# Patient Record
Sex: Female | Born: 1960 | Race: Black or African American | Hispanic: No | Marital: Married | Smoking: Former smoker
Health system: Southern US, Community
[De-identification: ages and names within clinical notes are randomized; demographics above are authoritative.]

## PROBLEM LIST (undated history)

## (undated) DIAGNOSIS — C189 Malignant neoplasm of colon, unspecified: Secondary | ICD-10-CM

## (undated) DIAGNOSIS — Z780 Asymptomatic menopausal state: Secondary | ICD-10-CM

## (undated) DIAGNOSIS — B019 Varicella without complication: Secondary | ICD-10-CM

## (undated) DIAGNOSIS — G620 Drug-induced polyneuropathy: Secondary | ICD-10-CM

## (undated) DIAGNOSIS — T451X5A Adverse effect of antineoplastic and immunosuppressive drugs, initial encounter: Secondary | ICD-10-CM

## (undated) DIAGNOSIS — E876 Hypokalemia: Secondary | ICD-10-CM

## (undated) DIAGNOSIS — I10 Essential (primary) hypertension: Secondary | ICD-10-CM

## (undated) HISTORY — PX: ECTOPIC PREGNANCY SURGERY: SHX613

## (undated) HISTORY — DX: Adverse effect of antineoplastic and immunosuppressive drugs, initial encounter: T45.1X5A

## (undated) HISTORY — DX: Varicella without complication: B01.9

## (undated) HISTORY — PX: SHOULDER SURGERY: SHX246

## (undated) HISTORY — DX: Malignant neoplasm of colon, unspecified: C18.9

## (undated) HISTORY — DX: Asymptomatic menopausal state: Z78.0

## (undated) HISTORY — PX: COLONOSCOPY: SHX174

## (undated) HISTORY — DX: Adverse effect of antineoplastic and immunosuppressive drugs, initial encounter: G62.0

## (undated) HISTORY — DX: Hypokalemia: E87.6

## (undated) HISTORY — PX: BREAST BIOPSY: SHX20

## (undated) SURGERY — INSERTION, TUNNELED CENTRAL VENOUS DEVICE, WITH PORT
Anesthesia: General

---

## 2004-03-08 ENCOUNTER — Ambulatory Visit: Payer: Self-pay | Admitting: General Practice

## 2004-03-10 ENCOUNTER — Ambulatory Visit: Payer: Self-pay | Admitting: General Practice

## 2004-08-26 ENCOUNTER — Ambulatory Visit: Payer: Self-pay | Admitting: General Surgery

## 2004-10-04 ENCOUNTER — Emergency Department (HOSPITAL_COMMUNITY): Admission: EM | Admit: 2004-10-04 | Discharge: 2004-10-04 | Payer: Self-pay | Admitting: Emergency Medicine

## 2004-10-18 ENCOUNTER — Ambulatory Visit (HOSPITAL_COMMUNITY): Admission: RE | Admit: 2004-10-18 | Discharge: 2004-10-18 | Payer: Self-pay | Admitting: Orthopedic Surgery

## 2005-03-13 ENCOUNTER — Ambulatory Visit: Payer: Self-pay | Admitting: General Practice

## 2006-04-10 ENCOUNTER — Ambulatory Visit: Payer: Self-pay | Admitting: General Surgery

## 2006-08-02 ENCOUNTER — Emergency Department: Payer: Self-pay | Admitting: Emergency Medicine

## 2015-05-21 ENCOUNTER — Encounter: Payer: Self-pay | Admitting: Physician Assistant

## 2015-05-21 ENCOUNTER — Ambulatory Visit: Payer: Self-pay | Admitting: Physician Assistant

## 2015-05-21 VITALS — BP 130/70 | HR 80 | Temp 98.3°F

## 2015-05-21 DIAGNOSIS — Z862 Personal history of diseases of the blood and blood-forming organs and certain disorders involving the immune mechanism: Secondary | ICD-10-CM

## 2015-05-21 DIAGNOSIS — F439 Reaction to severe stress, unspecified: Secondary | ICD-10-CM

## 2015-05-21 DIAGNOSIS — R1013 Epigastric pain: Secondary | ICD-10-CM

## 2015-05-21 DIAGNOSIS — R634 Abnormal weight loss: Secondary | ICD-10-CM

## 2015-05-21 MED ORDER — ESOMEPRAZOLE MAGNESIUM 40 MG PO CPDR: 40.0000 mg | DELAYED_RELEASE_CAPSULE | Freq: Every day | ORAL | Status: DC

## 2015-05-21 NOTE — Progress Notes (Signed)
54 y/o with epigastric pain x one week , gas, anorexia ,  Wt loss over the year  . She works > 40 hours a week and is worried about a son 80 living at home. Denies n,v,d,c, or melana  Denies cp or sob . She drinks one cup of coffee, one soda per day.  Never had a mammogram or colonoscopy. Fam Hx father with prostate ca.  O/ anxious thin, pleasant  Heart rsr lungs clear  Abd soft  Epigastric tenderness without guarding no mass or megally   A epigastric pain ? Gastritis  , Stress  Wt loss  Hx of anemia ( was refused donating blood recently and no MP s in 2 yrs.  P / nexium 40 started and appt is made for her to establish care with PCP who can follow up on sxs  Need referral for colonoscopy and likely endoscopy in view of sxs. Also labs.  Can refer her to [facility] . She is to follow up here in 2 weeks if her appt is later that that.

## 2015-05-25 ENCOUNTER — Other Ambulatory Visit: Payer: Self-pay | Admitting: Family

## 2015-05-25 DIAGNOSIS — R1013 Epigastric pain: Secondary | ICD-10-CM

## 2015-05-25 MED ORDER — ESOMEPRAZOLE MAGNESIUM 40 MG PO CPDR: 40.0000 mg | DELAYED_RELEASE_CAPSULE | Freq: Every day | ORAL | Status: DC

## 2015-06-03 ENCOUNTER — Ambulatory Visit: Payer: Self-pay | Admitting: Physician Assistant

## 2015-06-03 ENCOUNTER — Encounter: Payer: Self-pay | Admitting: Physician Assistant

## 2015-06-03 VITALS — BP 130/70 | HR 97 | Temp 98.1°F

## 2015-06-03 DIAGNOSIS — N390 Urinary tract infection, site not specified: Secondary | ICD-10-CM

## 2015-06-03 DIAGNOSIS — B3749 Other urogenital candidiasis: Secondary | ICD-10-CM

## 2015-06-03 LAB — POCT URINALYSIS DIPSTICK
Bilirubin, UA: NEGATIVE
Blood, UA: NEGATIVE
Glucose, UA: NEGATIVE
KETONES UA: NEGATIVE
Nitrite, UA: NEGATIVE
PH UA: 6
PROTEIN UA: NEGATIVE
SPEC GRAV UA: 1.02
Urobilinogen, UA: 0.2

## 2015-06-03 MED ORDER — CIPROFLOXACIN HCL 250 MG PO TABS: 250.0000 mg | ORAL_TABLET | Freq: Two times a day (BID) | ORAL | Status: DC

## 2015-06-03 NOTE — Progress Notes (Signed)
S:  C/o uti sx for 2 weeks, burning, urgency, frequency, denies vaginal discharge, abdominal pain or flank pain, fever/chills;  Remainder ros neg, started drinking water and cranberry juice when sx first appeared  O:  Vitals wnl, nad, no cva tenderness, back nontender, lungs c t a,cv rrr, abd soft nontender, bs normal, n/v intact  A: uti  P: cipro 250mg  bid x 7d, increase water intake, add cranberry juice, return if not improving in 2 -3 days, return earlier if worsening, discussed pyelonephritis sx

## 2015-06-08 ENCOUNTER — Encounter: Payer: Self-pay | Admitting: Family Medicine

## 2015-06-08 ENCOUNTER — Ambulatory Visit (INDEPENDENT_AMBULATORY_CARE_PROVIDER_SITE_OTHER): Payer: 59 | Admitting: Family Medicine

## 2015-06-08 ENCOUNTER — Other Ambulatory Visit (HOSPITAL_COMMUNITY)
Admission: RE | Admit: 2015-06-08 | Discharge: 2015-06-08 | Disposition: A | Discharge status: Home / Self Care | Payer: 59 | Source: Ambulatory Visit | Attending: Family Medicine | Admitting: Family Medicine

## 2015-06-08 VITALS — BP 138/82 | HR 75 | Temp 98.1°F | Ht 65.0 in | Wt 117.5 lb

## 2015-06-08 DIAGNOSIS — R634 Abnormal weight loss: Secondary | ICD-10-CM | POA: Diagnosis not present

## 2015-06-08 DIAGNOSIS — Z1151 Encounter for screening for human papillomavirus (HPV): Secondary | ICD-10-CM | POA: Insufficient documentation

## 2015-06-08 DIAGNOSIS — Z0000 Encounter for general adult medical examination without abnormal findings: Secondary | ICD-10-CM | POA: Diagnosis not present

## 2015-06-08 DIAGNOSIS — Z862 Personal history of diseases of the blood and blood-forming organs and certain disorders involving the immune mechanism: Secondary | ICD-10-CM

## 2015-06-08 DIAGNOSIS — Z13220 Encounter for screening for lipoid disorders: Secondary | ICD-10-CM

## 2015-06-08 DIAGNOSIS — Z130 Encounter for screening for diseases of the blood and blood-forming organs and certain disorders involving the immune mechanism: Secondary | ICD-10-CM

## 2015-06-08 DIAGNOSIS — Z1239 Encounter for other screening for malignant neoplasm of breast: Secondary | ICD-10-CM

## 2015-06-08 DIAGNOSIS — Z01419 Encounter for gynecological examination (general) (routine) without abnormal findings: Secondary | ICD-10-CM | POA: Insufficient documentation

## 2015-06-08 DIAGNOSIS — Z1211 Encounter for screening for malignant neoplasm of colon: Secondary | ICD-10-CM

## 2015-06-08 NOTE — Patient Instructions (Signed)
We will call your lab results.  Follow up annually.  Take care  Dr. Lacinda Axon   Health Maintenance, Female Adopting a healthy lifestyle and getting preventive care can go a long way to promote health and wellness. Talk with your health care provider about what schedule of regular examinations is right for you. This is a good chance for you to check in with your provider about disease prevention and staying healthy. In between checkups, there are plenty of things you can do on your own. Experts have done a lot of research about which lifestyle changes and preventive measures are most likely to keep you healthy. Ask your health care provider for more information. WEIGHT AND DIET  Eat a healthy diet  Be sure to include plenty of vegetables, fruits, low-fat dairy products, and lean protein.  Do not eat a lot of foods high in solid fats, added sugars, or salt.  Get regular exercise. This is one of the most important things you can do for your health.  Most adults should exercise for at least 150 minutes each week. The exercise should increase your heart rate and make you sweat (moderate-intensity exercise).  Most adults should also do strengthening exercises at least twice a week. This is in addition to the moderate-intensity exercise.  Maintain a healthy weight  Body mass index (BMI) is a measurement that can be used to identify possible weight problems. It estimates body fat based on height and weight. Your health care provider can help determine your BMI and help you achieve or maintain a healthy weight.  For females 49 years of age and older:   A BMI below 18.5 is considered underweight.  A BMI of 18.5 to 24.9 is normal.  A BMI of 25 to 29.9 is considered overweight.  A BMI of 30 and above is considered obese.  Watch levels of cholesterol and blood lipids  You should start having your blood tested for lipids and cholesterol at 53 years of age, then have this test every 5  years.  You may need to have your cholesterol levels checked more often if:  Your lipid or cholesterol levels are high.  You are older than 53 years of age.  You are at high risk for heart disease.  CANCER SCREENING   Lung Cancer  Lung cancer screening is recommended for adults 21-38 years old who are at high risk for lung cancer because of a history of smoking.  A yearly low-dose CT scan of the lungs is recommended for people who:  Currently smoke.  Have quit within the past 15 years.  Have at least a 30-pack-year history of smoking. A pack year is smoking an average of one pack of cigarettes a day for 1 year.  Yearly screening should continue until it has been 15 years since you quit.  Yearly screening should stop if you develop a health problem that would prevent you from having lung cancer treatment.  Breast Cancer  Practice breast self-awareness. This means understanding how your breasts normally appear and feel.  It also means doing regular breast self-exams. Let your health care provider know about any changes, no matter how small.  If you are in your 20s or 30s, you should have a clinical breast exam (CBE) by a health care provider every 1-3 years as part of a regular health exam.  If you are 50 or older, have a CBE every year. Also consider having a breast X-ray (mammogram) every year.  If you have  a family history of breast cancer, talk to your health care provider about genetic screening.  If you are at high risk for breast cancer, talk to your health care provider about having an MRI and a mammogram every year.  Breast cancer gene (BRCA) assessment is recommended for women who have family members with BRCA-related cancers. BRCA-related cancers include:  Breast.  Ovarian.  Tubal.  Peritoneal cancers.  Results of the assessment will determine the need for genetic counseling and BRCA1 and BRCA2 testing. Cervical Cancer Your health care provider may  recommend that you be screened regularly for cancer of the pelvic organs (ovaries, uterus, and vagina). This screening involves a pelvic examination, including checking for microscopic changes to the surface of your cervix (Pap test). You may be encouraged to have this screening done every 3 years, beginning at age 21.  For women ages 30-65, health care providers may recommend pelvic exams and Pap testing every 3 years, or they may recommend the Pap and pelvic exam, combined with testing for human papilloma virus (HPV), every 5 years. Some types of HPV increase your risk of cervical cancer. Testing for HPV may also be done on women of any age with unclear Pap test results.  Other health care providers may not recommend any screening for nonpregnant women who are considered low risk for pelvic cancer and who do not have symptoms. Ask your health care provider if a screening pelvic exam is right for you.  If you have had past treatment for cervical cancer or a condition that could lead to cancer, you need Pap tests and screening for cancer for at least 20 years after your treatment. If Pap tests have been discontinued, your risk factors (such as having a new sexual partner) need to be reassessed to determine if screening should resume. Some women have medical problems that increase the chance of getting cervical cancer. In these cases, your health care provider may recommend more frequent screening and Pap tests. Colorectal Cancer  This type of cancer can be detected and often prevented.  Routine colorectal cancer screening usually begins at 53 years of age and continues through 53 years of age.  Your health care provider may recommend screening at an earlier age if you have risk factors for colon cancer.  Your health care provider may also recommend using home test kits to check for hidden blood in the stool.  A small camera at the end of a tube can be used to examine your colon directly  (sigmoidoscopy or colonoscopy). This is done to check for the earliest forms of colorectal cancer.  Routine screening usually begins at age 50.  Direct examination of the colon should be repeated every 5-10 years through 53 years of age. However, you may need to be screened more often if early forms of precancerous polyps or small growths are found. Skin Cancer  Check your skin from head to toe regularly.  Tell your health care provider about any new moles or changes in moles, especially if there is a change in a mole's shape or color.  Also tell your health care provider if you have a mole that is larger than the size of a pencil eraser.  Always use sunscreen. Apply sunscreen liberally and repeatedly throughout the day.  Protect yourself by wearing long sleeves, pants, a wide-brimmed hat, and sunglasses whenever you are outside. HEART DISEASE, DIABETES, AND HIGH BLOOD PRESSURE   High blood pressure causes heart disease and increases the risk of stroke. High   blood pressure is more likely to develop in:  People who have blood pressure in the high end of the normal range (130-139/85-89 mm Hg).  People who are overweight or obese.  People who are African American.  If you are 18-39 years of age, have your blood pressure checked every 3-5 years. If you are 40 years of age or older, have your blood pressure checked every year. You should have your blood pressure measured twice--once when you are at a hospital or clinic, and once when you are not at a hospital or clinic. Record the average of the two measurements. To check your blood pressure when you are not at a hospital or clinic, you can use:  An automated blood pressure machine at a pharmacy.  A home blood pressure monitor.  If you are between 55 years and 79 years old, ask your health care provider if you should take aspirin to prevent strokes.  Have regular diabetes screenings. This involves taking a blood sample to check your  fasting blood sugar level.  If you are at a normal weight and have a low risk for diabetes, have this test once every three years after 53 years of age.  If you are overweight and have a high risk for diabetes, consider being tested at a younger age or more often. PREVENTING INFECTION  Hepatitis B  If you have a higher risk for hepatitis B, you should be screened for this virus. You are considered at high risk for hepatitis B if:  You were born in a country where hepatitis B is common. Ask your health care provider which countries are considered high risk.  Your parents were born in a high-risk country, and you have not been immunized against hepatitis B (hepatitis B vaccine).  You have HIV or AIDS.  You use needles to inject street drugs.  You live with someone who has hepatitis B.  You have had sex with someone who has hepatitis B.  You get hemodialysis treatment.  You take certain medicines for conditions, including cancer, organ transplantation, and autoimmune conditions. Hepatitis C  Blood testing is recommended for:  Everyone born from 1945 through 1965.  Anyone with known risk factors for hepatitis C. Sexually transmitted infections (STIs)  You should be screened for sexually transmitted infections (STIs) including gonorrhea and chlamydia if:  You are sexually active and are younger than 53 years of age.  You are older than 53 years of age and your health care provider tells you that you are at risk for this type of infection.  Your sexual activity has changed since you were last screened and you are at an increased risk for chlamydia or gonorrhea. Ask your health care provider if you are at risk.  If you do not have HIV, but are at risk, it may be recommended that you take a prescription medicine daily to prevent HIV infection. This is called pre-exposure prophylaxis (PrEP). You are considered at risk if:  You are sexually active and do not regularly use condoms or  know the HIV status of your partner(s).  You take drugs by injection.  You are sexually active with a partner who has HIV. Talk with your health care provider about whether you are at high risk of being infected with HIV. If you choose to begin PrEP, you should first be tested for HIV. You should then be tested every 3 months for as long as you are taking PrEP.  PREGNANCY   If you are   If you are premenopausal and you may become pregnant, ask your health care provider about preconception counseling.  If you may become pregnant, take 400 to 800 micrograms (mcg) of folic acid every day.  If you want to prevent pregnancy, talk to your health care provider about birth control (contraception). OSTEOPOROSIS AND MENOPAUSE   Osteoporosis is a disease in which the bones lose minerals and strength with aging. This can result in serious bone fractures. Your risk for osteoporosis can be identified using a bone density scan.  If you are 65 years of age or older, or if you are at risk for osteoporosis and fractures, ask your health care provider if you should be screened.  Ask your health care provider whether you should take a calcium or vitamin D supplement to lower your risk for osteoporosis.  Menopause may have certain physical symptoms and risks.  Hormone replacement therapy may reduce some of these symptoms and risks. Talk to your health care provider about whether hormone replacement therapy is right for you.  HOME CARE INSTRUCTIONS   Schedule regular health, dental, and eye exams.  Stay current with your immunizations.   Do not use any tobacco products including cigarettes, chewing tobacco, or electronic cigarettes.  If you are pregnant, do not drink alcohol.  If you are breastfeeding, limit how much and how often you drink alcohol.  Limit alcohol intake to no more than 1 drink per day for nonpregnant women. One drink equals 12 ounces of beer, 5 ounces of wine, or 1 ounces of hard  liquor.  Do not use street drugs.  Do not share needles.  Ask your health care provider for help if you need support or information about quitting drugs.  Tell your health care provider if you often feel depressed.  Tell your health care provider if you have ever been abused or do not feel safe at home.   This information is not intended to replace advice given to you by your health care provider. Make sure you discuss any questions you have with your health care provider.   Document Released: 11/21/2010 Document Revised: 05/29/2014 Document Reviewed: 04/09/2013 Elsevier Interactive Patient Education 2016 [employer]  

## 2015-06-08 NOTE — Assessment & Plan Note (Signed)
Pap smear and breast exam performed today. Mammogram ordered. Placing referral for colonoscopy. Screening labs today: CBC, CMP, Lipid, TSH.

## 2015-06-08 NOTE — Assessment & Plan Note (Signed)
Unclear etiology. Labs today. Will follow closely.

## 2015-06-08 NOTE — Progress Notes (Signed)
 Subjective:  Patient ID: Nancy Clark, female    DOB: 05/22/1961  Age: 54 y.o. MRN: [id number]  CC: Establish care  HPI Nancy Clark is a 54 y.o. female presents to the clinic today to establish care.  Preventative Healthcare  Pap smear: In need of. Will perform today.  Mammogram: In need of. Will order.  Colonoscopy: In need of. Discussed with patient and she would like to proceed. Will place referral today.  Immunizations  Tetanus - Reports up to date. States that had to be up to date prior to working at [employer].  Pneumococcal - Not indicated.   Flu - Up to date.  Zoster - Not indicated.   Hepatitis C screening - Has not had screening.  Labs: In need of screening labs today.  Exercise: No regular exercise.  Alcohol use: No.  Smoking/tobacco use: Current smoker.   Regular dental exams: Yes.   Wears seat belt: Yes.   Weight loss  Patient reports that she had recently loss weight (intentionally).  Once she got down to a healthy weight of 125 she was pleased.  Over the past 3 months she reports continued weight loss but states that it has been unintentional.  No other associated symptoms. She has had recent abdominal "cramping" which she states has improved with PPI therapy.  No known inciting factor for unintentional weight loss.  Normal appetite and intake per patient.  PMH, Surgical Hx, Family Hx, Social History reviewed and updated as below.  Past Medical History  Diagnosis Date  . Chicken pox    Past Surgical History  Procedure Laterality Date  . Shoulder surgery Left   . Ectopic pregnancy surgery     Family History  Problem Relation Age of Onset  . Hypertension Mother   . Arthritis Father   . Prostate cancer Father   . Diabetes Maternal Grandmother    Social History  Substance Use Topics  . Smoking status: Current Some Day Smoker  . Smokeless tobacco: Never Used  . Alcohol Use: No    Review of Systems  Cardiovascular: Positive  for palpitations.  Gastrointestinal:       Abdominal cramping; recently treated with PPI.  Genitourinary:       Currently being treated for UTI.  All other systems reviewed and are negative.   Objective:   Today's Vitals: BP 138/82 mmHg  Pulse 75  Temp(Src) 98.1 F (36.7 C) (Oral)  Ht 5' 5" (1.651 m)  Wt 117 lb 8 oz (53.298 kg)  BMI 19.55 kg/m2  SpO2 98%  LMP  (LMP Unknown)  Physical Exam  Constitutional: She is oriented to person, place, and time. She appears well-developed and well-nourished. No distress.  HENT:  Head: Normocephalic and atraumatic.  Nose: Nose normal.  Mouth/Throat: Oropharynx is clear and moist. No oropharyngeal exudate.  Normal TM's bilaterally.   Eyes: Conjunctivae are normal. No scleral icterus.  Neck: Neck supple. No thyromegaly present.  Cardiovascular: Normal rate and regular rhythm.   No murmur heard. Pulmonary/Chest: Effort normal and breath sounds normal. She has no wheezes. She has no rales.  Abdominal: Soft. She exhibits no distension. There is no tenderness. There is no rebound and no guarding.  Musculoskeletal: Normal range of motion. She exhibits no edema.  Lymphadenopathy:    She has no cervical adenopathy.  Neurological: She is alert and oriented to person, place, and time.  Skin: Skin is warm and dry. No rash noted.  Psychiatric: She has a normal mood and affect.    Vitals reviewed. Pelvic Exam:        External: normal female genitalia without lesions or masses        Vagina: normal without lesions or masses        Cervix: normal without lesions or masses        Pap smear: performed Breasts: breasts appear normal, no appreciable masses, no skin or nipple changes or axillary nodes.   Assessment & Plan:   Problem List Items Addressed This Visit    Weight loss    Unclear etiology. Labs today. Will follow closely.       Relevant Orders   Comp Met (CMET)   TSH   Preventative health care - Primary    Pap smear and breast exam  performed today. Mammogram ordered. Placing referral for colonoscopy. Screening labs today: CBC, CMP, Lipid, TSH.       Relevant Orders   Cytology - PAP   MM Digital Diagnostic Bilat   Ambulatory referral to Gastroenterology    Other Visit Diagnoses    Screening for deficiency anemia        Relevant Orders    CBC    Breast cancer screening        Relevant Orders    MM Digital Diagnostic Bilat    Screening, lipid        Relevant Orders    Lipid panel    History of anemia        Relevant Orders    Iron    Ferritin    Iron Binding Cap (TIBC)    Encounter for screening colonoscopy        Relevant Orders    Ambulatory referral to Gastroenterology       Outpatient Encounter Prescriptions as of 06/08/2015  Medication Sig  . ciprofloxacin (CIPRO) 250 MG tablet Take 1 tablet (250 mg total) by mouth 2 (two) times daily.  Marland Kitchen esomeprazole (NEXIUM) 40 MG capsule Take 1 capsule (40 mg total) by mouth daily.   No facility-administered encounter medications on file as of 06/08/2015.    Follow-up: Annually or sooner if needed.   [place]

## 2015-06-09 ENCOUNTER — Other Ambulatory Visit: Payer: Self-pay | Admitting: Family Medicine

## 2015-06-09 LAB — FERRITIN: FERRITIN: 10.3 ng/mL (ref 10.0–291.0)

## 2015-06-09 LAB — IBC PANEL
IRON: 28 ug/dL — AB (ref 42–145)
SATURATION RATIOS: 7 % — AB (ref 20.0–50.0)
TRANSFERRIN: 286 mg/dL (ref 212.0–360.0)

## 2015-06-09 LAB — COMPREHENSIVE METABOLIC PANEL
ALT: 7 U/L (ref 0–35)
AST: 11 U/L (ref 0–37)
Albumin: 3.6 g/dL (ref 3.5–5.2)
Alkaline Phosphatase: 86 U/L (ref 39–117)
BUN: 9 mg/dL (ref 6–23)
CALCIUM: 9 mg/dL (ref 8.4–10.5)
CHLORIDE: 108 meq/L (ref 96–112)
CO2: 29 meq/L (ref 19–32)
CREATININE: 0.76 mg/dL (ref 0.40–1.20)
GFR: 102.75 mL/min (ref >60.00)
Glucose, Bld: 84 mg/dL (ref 70–99)
Potassium: 3.9 mEq/L (ref 3.5–5.1)
Sodium: 144 mEq/L (ref 135–145)
Total Bilirubin: 0.2 mg/dL (ref 0.2–1.2)
Total Protein: 6.3 g/dL (ref 6.0–8.3)

## 2015-06-09 LAB — LIPID PANEL
CHOL/HDL RATIO: 3
Cholesterol: 150 mg/dL (ref 0–200)
HDL: 48.9 mg/dL (ref >39.00)
LDL CALC: 79 mg/dL (ref 0–99)
NonHDL: 101.27
TRIGLYCERIDES: 110 mg/dL (ref 0.0–149.0)
VLDL: 22 mg/dL (ref 0.0–40.0)

## 2015-06-09 LAB — CBC
HCT: 39.8 % (ref 36.0–46.0)
Hemoglobin: 12.5 g/dL (ref 12.0–15.0)
MCHC: 31.5 g/dL (ref 30.0–36.0)
MCV: 80.8 fl (ref 78.0–100.0)
PLATELETS: 203 10*3/uL (ref 150.0–400.0)
RBC: 4.92 Mil/uL (ref 3.87–5.11)
RDW: 18.3 % — AB (ref 11.5–15.5)
WBC: 7.7 10*3/uL (ref 4.0–10.5)

## 2015-06-09 LAB — IRON: Iron: 26 ug/dL — ABNORMAL LOW (ref 42–145)

## 2015-06-09 LAB — TSH: TSH: 0.6 u[IU]/mL (ref 0.35–4.50)

## 2015-06-09 MED ORDER — FERROUS SULFATE 325 (65 FE) MG PO TABS: 325.0000 mg | ORAL_TABLET | Freq: Two times a day (BID) | ORAL | Status: DC

## 2015-06-09 NOTE — Addendum Note (Signed)
Addended by: Karlene Einstein D on: 06/09/2015 10:53 AM   Modules accepted: Orders

## 2015-06-10 ENCOUNTER — Telehealth: Payer: Self-pay | Admitting: *Deleted

## 2015-06-10 NOTE — Telephone Encounter (Signed)
See result note.  

## 2015-06-10 NOTE — Telephone Encounter (Signed)
Patient requested lab results from 06/08/15. Contact [phone]

## 2015-06-11 ENCOUNTER — Telehealth: Payer: Self-pay | Admitting: Family Medicine

## 2015-06-11 LAB — CYTOLOGY - PAP

## 2015-06-11 MED ORDER — FERROUS SULFATE 325 (65 FE) MG PO TABS: 325.0000 mg | ORAL_TABLET | Freq: Two times a day (BID) | ORAL | Status: DC

## 2015-06-11 NOTE — Telephone Encounter (Signed)
Rx was resent to pharmacy again

## 2015-06-11 NOTE — Telephone Encounter (Signed)
Patient called stating that her  Iron pills were not at the pharmacy. I informed her that her medication was sent electronically to [facility] on 1.19.17

## 2015-06-14 ENCOUNTER — Telehealth: Payer: Self-pay | Admitting: Family Medicine

## 2015-06-14 NOTE — Telephone Encounter (Signed)
[id number]

## 2015-06-14 NOTE — Telephone Encounter (Signed)
Pt called about her medication not at the pharmacy. Medication pt is not sure of the name

## 2015-06-14 NOTE — Telephone Encounter (Signed)
Call pharmacy amd they stated that Rx is an otc drug, that she pay oout of pocket for.

## 2015-06-14 NOTE — Telephone Encounter (Signed)
See below Cont. Pharmacy is [place], [address]. Call pt @ [phone]. Thank You!

## 2015-06-22 DIAGNOSIS — D509 Iron deficiency anemia, unspecified: Secondary | ICD-10-CM | POA: Diagnosis not present

## 2015-06-22 DIAGNOSIS — R1013 Epigastric pain: Secondary | ICD-10-CM | POA: Diagnosis not present

## 2015-06-22 DIAGNOSIS — R634 Abnormal weight loss: Secondary | ICD-10-CM | POA: Diagnosis not present

## 2015-06-23 ENCOUNTER — Other Ambulatory Visit: Payer: Self-pay | Admitting: Family Medicine

## 2015-06-23 DIAGNOSIS — Z1239 Encounter for other screening for malignant neoplasm of breast: Secondary | ICD-10-CM

## 2015-06-28 ENCOUNTER — Other Ambulatory Visit: Payer: Self-pay | Admitting: Family Medicine

## 2015-06-28 NOTE — Addendum Note (Signed)
Addended by: Coral Spikes on: 06/28/2015 04:55 PM   Modules accepted: Orders

## 2015-06-29 ENCOUNTER — Encounter: Payer: Self-pay | Admitting: Family Medicine

## 2015-06-30 DIAGNOSIS — K319 Disease of stomach and duodenum, unspecified: Secondary | ICD-10-CM | POA: Diagnosis not present

## 2015-06-30 DIAGNOSIS — R1013 Epigastric pain: Secondary | ICD-10-CM | POA: Diagnosis not present

## 2015-06-30 DIAGNOSIS — C189 Malignant neoplasm of colon, unspecified: Secondary | ICD-10-CM | POA: Diagnosis not present

## 2015-06-30 DIAGNOSIS — C180 Malignant neoplasm of cecum: Secondary | ICD-10-CM | POA: Diagnosis not present

## 2015-06-30 DIAGNOSIS — D123 Benign neoplasm of transverse colon: Secondary | ICD-10-CM | POA: Diagnosis not present

## 2015-06-30 DIAGNOSIS — K3189 Other diseases of stomach and duodenum: Secondary | ICD-10-CM | POA: Diagnosis not present

## 2015-06-30 DIAGNOSIS — D125 Benign neoplasm of sigmoid colon: Secondary | ICD-10-CM | POA: Diagnosis not present

## 2015-06-30 DIAGNOSIS — D509 Iron deficiency anemia, unspecified: Secondary | ICD-10-CM | POA: Diagnosis not present

## 2015-06-30 DIAGNOSIS — D126 Benign neoplasm of colon, unspecified: Secondary | ICD-10-CM | POA: Diagnosis not present

## 2015-06-30 DIAGNOSIS — R634 Abnormal weight loss: Secondary | ICD-10-CM | POA: Diagnosis not present

## 2015-07-13 ENCOUNTER — Telehealth: Payer: Self-pay | Admitting: Family Medicine

## 2015-07-13 NOTE — Telephone Encounter (Signed)
Left message to call office. Need to find out if patient has had a mammogram else where since [email or web address]

## 2015-07-15 DIAGNOSIS — C182 Malignant neoplasm of ascending colon: Secondary | ICD-10-CM | POA: Diagnosis not present

## 2015-07-16 ENCOUNTER — Other Ambulatory Visit: Payer: Self-pay | Admitting: Surgery

## 2015-07-16 DIAGNOSIS — C182 Malignant neoplasm of ascending colon: Secondary | ICD-10-CM

## 2015-07-20 ENCOUNTER — Inpatient Hospital Stay: Payer: 59 | Attending: Internal Medicine | Admitting: Internal Medicine

## 2015-07-20 ENCOUNTER — Encounter: Payer: Self-pay | Admitting: Internal Medicine

## 2015-07-20 ENCOUNTER — Inpatient Hospital Stay: Payer: 59

## 2015-07-20 VITALS — BP 135/79 | HR 82 | Temp 97.8°F | Resp 18 | Ht 60.5 in | Wt 112.0 lb

## 2015-07-20 DIAGNOSIS — C182 Malignant neoplasm of ascending colon: Secondary | ICD-10-CM | POA: Diagnosis not present

## 2015-07-20 DIAGNOSIS — Z8042 Family history of malignant neoplasm of prostate: Secondary | ICD-10-CM | POA: Diagnosis not present

## 2015-07-20 DIAGNOSIS — R109 Unspecified abdominal pain: Secondary | ICD-10-CM | POA: Insufficient documentation

## 2015-07-20 DIAGNOSIS — R634 Abnormal weight loss: Secondary | ICD-10-CM | POA: Insufficient documentation

## 2015-07-20 DIAGNOSIS — F17210 Nicotine dependence, cigarettes, uncomplicated: Secondary | ICD-10-CM | POA: Insufficient documentation

## 2015-07-20 DIAGNOSIS — Z79899 Other long term (current) drug therapy: Secondary | ICD-10-CM | POA: Insufficient documentation

## 2015-07-20 NOTE — Progress Notes (Signed)
[facility] NOTE  Patient Care Team: Coral Spikes, DO as PCP - General (Family Medicine)  CHIEF COMPLAINTS/PURPOSE OF CONSULTATION:   # FEB 2017- CECAL MASS s/p Bx- invasive adenoca; mod diff [Dr.Rein];  Obstructing on the colonoscopy.  HISTORY OF PRESENTING ILLNESS:  Nancy Clark 55 y.o.  female  Has been referred to [place]  For further evaluation and recommendation for newly diagnosed  Colon cancer. Patient stated that she had intermittent abdominal pain and cramping.  And also lost about 20 pounds/ some of it was intentional.     On colonoscopy patient was found to have a completely obstructing mass at the ileocecal valve;  Biopsy- moderately differentiated adenocarcinoma.  EGD was negative.Patient has met with Dr. Tamala Julian;  And is awaiting right hemicolectomy in the next week or so.    Interestingly patient denies any constipation. Denies any blood in stools.  Denies any diarrhea.  Appetite is fair. No nausea no vomiting.   Review of labs does not show anemia.   ROS: A complete 10 point review of system is done which is negative except mentioned above in history of present illness  MEDICAL HISTORY:  Past Medical History  Diagnosis Date  . Chicken pox     SURGICAL HISTORY: Past Surgical History  Procedure Laterality Date  . Shoulder surgery Left   . 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  1 pack in 3 days; no alcohol. 2 childern [25 & 17 years] Social History   Social History  . Marital Status: Single    Spouse Name: N/A  . Number of Children: N/A  . Years of Education: N/A   Occupational History  . Not on file.   Social History Main Topics  . Smoking status: Current Some Day Smoker  . Smokeless tobacco: Never Used  . Alcohol Use: No  . Drug Use: No  . Sexual Activity: Yes   Other Topics Concern  . Not on file   Social History Narrative    FAMILY HISTORY: 1 brother& 1 sister- no cancer.  Mom-no cancer Family History  Problem Relation Age of Onset  . Hypertension Mother   . Arthritis Father   . Prostate cancer Father   . Diabetes Maternal Grandmother     ALLERGIES:  has No Known Allergies.  MEDICATIONS:  Current Outpatient Prescriptions  Medication Sig Dispense Refill  . ferrous sulfate 325 (65 FE) MG tablet Take 1 tablet (325 mg total) by mouth 2 (two) times daily. 60 tablet 1  . ciprofloxacin (CIPRO) 250 MG tablet Take 1 tablet (250 mg total) by mouth 2 (two) times daily. (Patient not taking: Reported on 07/20/2015) 14 tablet 0  . esomeprazole (NEXIUM) 40 MG capsule Take 1 capsule (40 mg total) by mouth daily. (Patient not taking: Reported on 07/20/2015) 30 capsule 0   No current facility-administered medications for this visit.      Marland Kitchen  PHYSICAL EXAMINATION: ECOG PERFORMANCE STATUS: 0 - Asymptomatic  Filed Vitals:   07/20/15 1515  BP: 135/79  Pulse: 82  Temp: 97.8 F (36.6 C)  Resp: 18   Filed Weights   07/20/15 1515  Weight: 111 lb 15.9 oz (50.8 kg)    GENERAL: Well-nourished well-developed; thin built; Alert, no distress and comfortable.   Accompanied by her mother.  EYES: no pallor or icterus OROPHARYNX: no thrush or ulceration; good dentition  NECK: supple, no masses felt LYMPH:  no palpable lymphadenopathy in the cervical, axillary  or inguinal regions LUNGS: clear to auscultation and  No wheeze or crackles HEART/CVS: regular rate & rhythm and no murmurs; No lower extremity edema ABDOMEN: abdomen soft, non-tender and normal bowel sounds Musculoskeletal:no cyanosis of digits and no clubbing  PSYCH: alert & oriented x 3 with fluent speech NEURO: no focal motor/sensory deficits SKIN:  no rashes or significant lesions  LABORATORY DATA:  I have reviewed the data as listed Lab Results  Component Value Date   WBC 7.7 06/08/2015   HGB 12.5 06/08/2015   HCT 39.8 06/08/2015   MCV 80.8 06/08/2015   PLT 203.0 06/08/2015    Recent Labs   06/08/15 1619  NA 144  K 3.9  CL 108  CO2 29  GLUCOSE 84  BUN 9  CREATININE 0.76  CALCIUM 9.0  PROT 6.3  ALBUMIN 3.6  AST 11  ALT 7  ALKPHOS 86  BILITOT 0.2     ASSESSMENT & PLAN:   #  Colon cancer- cecal/ right-sided moderately differentiated adenocarcinoma- obstructing mass noted on the colonoscopy. Clinically patient  Does not have any symptoms of obstruction.  Stage unknown at this time.  Recommend checking  CEA/  Also add CT of the chest-  Along with the abdomen and pelvis CAT scan to be done this week.   #  I reviewed at length that  Adjuvant chemotherapy  Recommendation we based on  The stage/  And if number of lymph nodes  Are involved.  #  Right-sided colon cancer/ no family history-  IHC could be checked for  Mismatch repair proteins.   #  In terms of follow-up patient will follow-up with me 2-3 weeks after the surgery [ plan on March 14];  However if the scans abnormal [ concerning for metastatic disease]-  She would definitely see me sooner.   Thank you Dr. Tamala Julian for allowing me to participate in the care of your pleasant patient. Please do not hesitate to contact me with questions or concerns in the interim. I left a message for Dr. Tamala Julian to talk to him regarding the above impression and plan.  # 45 minutes face-to-face with the patient discussing the above plan of care; more than 50% of time spent on prognosis/ natural history; counseling and coordination.    Cammie Sickle, MD 07/20/2015 3:23 PM

## 2015-07-21 LAB — CEA: CEA: 7.6 ng/mL — AB (ref 0.0–4.7)

## 2015-07-22 ENCOUNTER — Ambulatory Visit
Admission: RE | Admit: 2015-07-22 | Discharge: 2015-07-22 | Disposition: A | Discharge status: Home / Self Care | Payer: 59 | Source: Ambulatory Visit | Attending: Surgery | Admitting: Surgery

## 2015-07-22 ENCOUNTER — Encounter: Payer: Self-pay | Admitting: Family Medicine

## 2015-07-22 DIAGNOSIS — C182 Malignant neoplasm of ascending colon: Secondary | ICD-10-CM | POA: Diagnosis not present

## 2015-07-22 DIAGNOSIS — C189 Malignant neoplasm of colon, unspecified: Secondary | ICD-10-CM | POA: Diagnosis not present

## 2015-07-22 MED ORDER — IOHEXOL 300 MG/ML  SOLN
80.0000 mL | Freq: Once | INTRAMUSCULAR | Status: AC | PRN
  Administered 2015-07-22: 80 mL via INTRAVENOUS

## 2015-07-26 ENCOUNTER — Other Ambulatory Visit: Payer: Self-pay

## 2015-07-27 ENCOUNTER — Encounter
Admission: RE | Admit: 2015-07-27 | Discharge: 2015-07-27 | Disposition: A | Discharge status: Home / Self Care | Payer: 59 | Source: Ambulatory Visit | Attending: Surgery | Admitting: Surgery

## 2015-07-27 DIAGNOSIS — Z01812 Encounter for preprocedural laboratory examination: Secondary | ICD-10-CM | POA: Insufficient documentation

## 2015-07-27 LAB — COMPREHENSIVE METABOLIC PANEL
ALK PHOS: 81 U/L (ref 38–126)
ALT: 11 U/L — AB (ref 14–54)
AST: 15 U/L (ref 15–41)
Albumin: 4.1 g/dL (ref 3.5–5.0)
Anion gap: 9 (ref 5–15)
BILIRUBIN TOTAL: 0.3 mg/dL (ref 0.3–1.2)
BUN: 14 mg/dL (ref 6–20)
CALCIUM: 9.1 mg/dL (ref 8.9–10.3)
CO2: 24 mmol/L (ref 22–32)
CREATININE: 0.88 mg/dL (ref 0.44–1.00)
Chloride: 108 mmol/L (ref 101–111)
Glucose, Bld: 87 mg/dL (ref 65–99)
Potassium: 3.7 mmol/L (ref 3.5–5.1)
Sodium: 141 mmol/L (ref 135–145)
TOTAL PROTEIN: 7.4 g/dL (ref 6.5–8.1)

## 2015-07-27 LAB — HEMOGLOBIN: Hemoglobin: 12.2 g/dL (ref 12.0–16.0)

## 2015-07-27 LAB — TYPE AND SCREEN
ABO/RH(D): O POS
ANTIBODY SCREEN: NEGATIVE

## 2015-07-27 LAB — SURGICAL PCR SCREEN
MRSA, PCR: NEGATIVE
Staphylococcus aureus: NEGATIVE

## 2015-07-27 LAB — ABO/RH: ABO/RH(D): O POS

## 2015-07-27 NOTE — Patient Instructions (Signed)
  Your procedure is scheduled on: Tuesday 08/03/15 Report to Day Surgery. 2ND FLOOR MEDICAL MALL ENTRANCE To find out your arrival time please call [phone] between 1PM - 3PM on Monday 08/02/15.  Remember: Instructions that are not followed completely may result in serious medical risk, up to and including death, or upon the discretion of your surgeon and anesthesiologist your surgery may need to be rescheduled.    __X__ 1. Do not eat food or drink liquids after midnight. No gum chewing or hard candies.     __X__ 2. No Alcohol for 24 hours before or after surgery.   ____ 3. Bring all medications with you on the day of surgery if instructed.    __X__ 4. Notify your doctor if there is any change in your medical condition     (cold, fever, infections).     Do not wear jewelry, make-up, hairpins, clips or nail polish.  Do not wear lotions, powders, or perfumes.   Do not shave 48 hours prior to surgery. Men may shave face and neck.  Do not bring valuables to the hospital.    [facility] is not responsible for any belongings or valuables.               Contacts, dentures or bridgework may not be worn into surgery.  Leave your suitcase in the car. After surgery it may be brought to your room.  For patients admitted to the hospital, discharge time is determined by your                treatment team.   Patients discharged the day of surgery will not be allowed to drive home.   Please read over the following fact sheets that you were given:   MRSA Information and Surgical Site Infection Prevention   ____ Take these medicines the morning of surgery with A SIP OF WATER:    1. NONE  2.   3.   4.  5.  6.  ____ Fleet Enema (as directed)   __X__ Use CHG Soap as directed  ____ Use inhalers on the day of surgery  ____ Stop metformin 2 days prior to surgery    ____ Take 1/2 of usual insulin dose the night before surgery and none on the morning of surgery.   ____ Stop  Coumadin/Plavix/aspirin on   ____ Stop Anti-inflammatories on TYLENOL ONLY FOR ACHES OR PAINS   ____ Stop supplements until after surgery.    ____ Bring C-Pap to the hospital.

## 2015-07-28 LAB — CEA: CEA: 7 ng/mL — AB (ref 0.0–4.7)

## 2015-08-02 ENCOUNTER — Ambulatory Visit (INDEPENDENT_AMBULATORY_CARE_PROVIDER_SITE_OTHER): Payer: 59 | Admitting: Surgery

## 2015-08-02 ENCOUNTER — Encounter: Payer: Self-pay | Admitting: Surgery

## 2015-08-02 ENCOUNTER — Telehealth: Payer: Self-pay

## 2015-08-02 VITALS — BP 144/81 | HR 84 | Temp 98.3°F | Ht 60.0 in | Wt 112.0 lb

## 2015-08-02 DIAGNOSIS — C182 Malignant neoplasm of ascending colon: Secondary | ICD-10-CM

## 2015-08-02 MED ORDER — POLYETHYLENE GLYCOL 3350 17 GM/SCOOP PO POWD: 1.0000 | Freq: Once | ORAL | Status: DC

## 2015-08-02 MED ORDER — BISACODYL EC 5 MG PO TBEC: DELAYED_RELEASE_TABLET | ORAL | Status: DC

## 2015-08-02 NOTE — Pre-Procedure Instructions (Signed)
Case turned over to Dr Burt Knack surgery date changed to 08/09/15. New orders from Dr Burt Knack in [facility]. Discontinued Dr Darliss Ridgel orders.

## 2015-08-02 NOTE — Progress Notes (Signed)
Surgical Consultation  08/02/2015  Nancy Clark is an 55 y.o. female.   [id number] cancer  HPI: Pt of dr Lavone Neri Smith's with rt colon cancer. Patient started having abdominal pain in November and now presents with a workup showing a right colon cancer. Her CT scan is been personally reviewed. She describes a 35 pound weight loss initially she was trying to lose weight but continues to lose weight even though she has stopped dieting. He denies fevers or chills melena or hematochezia but started with right sided abdominal pain in November which precipitated the need for workup. Family history of colon cancer in her grandfather only but she is a smoker. Her only other surgery was a shoulder surgery and a tubal pregnancy Patient continues to have abdominal pain Past Medical History  Diagnosis Date  . Chicken pox   . Cancer [facility])     recently cancer of right colon cancer    Past Surgical History  Procedure Laterality Date  . Shoulder surgery Left   . Ectopic pregnancy surgery    . Colonoscopy      Family History  Problem Relation Age of Onset  . Hypertension Mother   . Arthritis Father   . Prostate cancer Father   . Diabetes Maternal Grandmother     Social History:  reports that she has been smoking.  She has never used smokeless tobacco. She reports that she does not drink alcohol or use illicit drugs.  Allergies: No Known Allergies  Medications reviewed.   Review of Systems:   Review of Systems  Constitutional: Positive for weight loss. Negative for fever, chills, malaise/fatigue and diaphoresis.  HENT: Negative.   Eyes: Negative.   Respiratory: Negative.   Cardiovascular: Negative.   Gastrointestinal: Positive for abdominal pain. Negative for heartburn, nausea, vomiting, diarrhea, constipation, blood in stool and melena.  Genitourinary: Negative.   Musculoskeletal: Negative.   Skin: Negative.   Neurological: Negative.   Endo/Heme/Allergies: Negative.    Psychiatric/Behavioral: Negative.      Physical Exam:  BP 144/81 mmHg  Pulse 84  Temp(Src) 98.3 F (36.8 C)  Ht 5' (1.524 m)  Wt 112 lb (50.803 kg)  BMI 21.87 kg/m2  LMP  (LMP Unknown)  Physical Exam  Constitutional: She is oriented to person, place, and time and well-developed, well-nourished, and in no distress. No distress.  HENT:  Head: Normocephalic and atraumatic.  Eyes: Pupils are equal, round, and reactive to light. Right eye exhibits no discharge. Left eye exhibits no discharge. No scleral icterus.  Neck: Normal range of motion. Neck supple.  Cardiovascular: Normal rate, regular rhythm and normal heart sounds.   Pulmonary/Chest: Effort normal and breath sounds normal. No respiratory distress. She has no wheezes. She has no rales.  Abdominal: Soft. She exhibits distension and mass. There is tenderness. There is no rebound and no guarding.  Mass in right mid abdomen which is minimally tender no peritoneal signs small scar at the umbilicus  Musculoskeletal: Normal range of motion. She exhibits no edema or tenderness.  Lymphadenopathy:    She has no cervical adenopathy.  Neurological: She is alert and oriented to person, place, and time.  Skin: Skin is warm and dry. She is not diaphoretic. No erythema.  Psychiatric: Mood and affect normal.  Vitals reviewed.     No results found for this or any previous visit (from the past 48 hour(s)). No results found.  Assessment/Plan:  Right-sided colon cancer with extensive lymphadenopathy suggestive of heavy metastasis burden in the mesentery. This  CT scan finding has been personally reviewed. Labs are been personally reviewed.  She was scheduled for laparoscopic right colon resection tomorrow by Dr. Rochel Brome who is unable to perform her surgery and I was requested to see the patient for continued care.  This patient with extensive lymphadenopathy and likely metastasis and a palpable mass in her right side. She requires  open colon resection laparotomy and exploration of her mesentery to decrease tumor burden. It is unlikely that surgical intervention will cure this patient and she will require further treatment.  Discussed with the patient the rationale for open versus laparoscopic approach and the need to debulk some of these larger masses in her mesentery extending up in her retroperitoneum I also discussed with her the risks of bleeding infection anastomotic leak and ostomy. Family member was present as well.  Multiple questions were answered for she and her mother and they understood and agreed to proceed with this plan.  Florene Glen, MD, FACS

## 2015-08-02 NOTE — Patient Instructions (Signed)
Please remember to pick up your prescriptions to take before your surgery. Instructions were provided to you. After you have your surgery done, you will stay at the hospital four to five days.

## 2015-08-02 NOTE — Telephone Encounter (Signed)
Dulcolax and Miralax were sent to her pharmacy. Patient was notified.

## 2015-08-03 ENCOUNTER — Encounter: Admission: RE | Payer: Self-pay | Source: Ambulatory Visit

## 2015-08-03 ENCOUNTER — Inpatient Hospital Stay
Admission: RE | Admit: 2015-08-03 | Discharge: 2015-08-03 | Disposition: A | Discharge status: Home / Self Care | Payer: Self-pay | Source: Ambulatory Visit

## 2015-08-03 ENCOUNTER — Inpatient Hospital Stay: Admission: RE | Admit: 2015-08-03 | Payer: 59 | Source: Ambulatory Visit | Admitting: Surgery

## 2015-08-03 SURGERY — COLECTOMY, RIGHT, LAPAROSCOPIC
Anesthesia: Choice | Laterality: Right

## 2015-08-04 ENCOUNTER — Telehealth: Payer: Self-pay | Admitting: Surgery

## 2015-08-04 NOTE — Telephone Encounter (Signed)
Pt advised of pre op date/time and sx date. Sx: 08/09/15 with Dr Cooper--Open Right Hemicolectomy.  Pre op: Patient had a recent pre op.   Patient made aware to call [phone], between 1-3:00pm the day before surgery, to find out what time to arrive.

## 2015-08-06 ENCOUNTER — Other Ambulatory Visit: Payer: Self-pay | Admitting: Family Medicine

## 2015-08-06 ENCOUNTER — Ambulatory Visit
Admission: RE | Admit: 2015-08-06 | Discharge: 2015-08-06 | Disposition: A | Discharge status: Home / Self Care | Payer: 59 | Source: Ambulatory Visit | Attending: Family Medicine | Admitting: Family Medicine

## 2015-08-06 DIAGNOSIS — Z1239 Encounter for other screening for malignant neoplasm of breast: Secondary | ICD-10-CM

## 2015-08-06 DIAGNOSIS — Z1231 Encounter for screening mammogram for malignant neoplasm of breast: Secondary | ICD-10-CM | POA: Insufficient documentation

## 2015-08-09 ENCOUNTER — Inpatient Hospital Stay: Payer: 59 | Admitting: Certified Registered Nurse Anesthetist

## 2015-08-09 ENCOUNTER — Encounter
Admission: RE | Disposition: A | Discharge status: Home / Self Care | Payer: Self-pay | Source: Ambulatory Visit | Attending: Surgery

## 2015-08-09 ENCOUNTER — Inpatient Hospital Stay: Payer: 59 | Admitting: Internal Medicine

## 2015-08-09 ENCOUNTER — Inpatient Hospital Stay
Admission: RE | Admit: 2015-08-09 | Discharge: 2015-08-14 | DRG: 331 | Disposition: A | Discharge status: Home / Self Care | Payer: 59 | Source: Ambulatory Visit | Attending: Surgery | Admitting: Surgery

## 2015-08-09 DIAGNOSIS — C182 Malignant neoplasm of ascending colon: Secondary | ICD-10-CM | POA: Diagnosis not present

## 2015-08-09 DIAGNOSIS — F17210 Nicotine dependence, cigarettes, uncomplicated: Secondary | ICD-10-CM | POA: Diagnosis present

## 2015-08-09 DIAGNOSIS — K567 Ileus, unspecified: Secondary | ICD-10-CM | POA: Diagnosis not present

## 2015-08-09 DIAGNOSIS — C189 Malignant neoplasm of colon, unspecified: Secondary | ICD-10-CM | POA: Diagnosis present

## 2015-08-09 HISTORY — PX: COLOSTOMY REVISION: SHX5232

## 2015-08-09 LAB — CBC
HEMATOCRIT: 38.7 % (ref 35.0–47.0)
Hemoglobin: 12.5 g/dL (ref 12.0–16.0)
MCH: 26.3 pg (ref 26.0–34.0)
MCHC: 32.4 g/dL (ref 32.0–36.0)
MCV: 81.1 fL (ref 80.0–100.0)
PLATELETS: 187 10*3/uL (ref 150–440)
RBC: 4.77 MIL/uL (ref 3.80–5.20)
RDW: 16.9 % — AB (ref 11.5–14.5)
WBC: 8.9 10*3/uL (ref 3.6–11.0)

## 2015-08-09 LAB — CREATININE, SERUM
Creatinine, Ser: 0.79 mg/dL (ref 0.44–1.00)
GFR calc Af Amer: >60 mL/min (ref >60)
GFR calc non Af Amer: >60 mL/min (ref >60)

## 2015-08-09 SURGERY — COLECTOMY, RIGHT
Anesthesia: General | Laterality: Right | Wound class: Clean Contaminated

## 2015-08-09 MED ORDER — PROPOFOL 10 MG/ML IV BOLUS
INTRAVENOUS | Status: DC | PRN
  Administered 2015-08-09: 110 mg via INTRAVENOUS

## 2015-08-09 MED ORDER — FENTANYL CITRATE (PF) 100 MCG/2ML IJ SOLN
25.0000 ug | INTRAMUSCULAR | Status: AC | PRN
  Administered 2015-08-09 (×2): 25 ug via INTRAVENOUS

## 2015-08-09 MED ORDER — BUPIVACAINE-EPINEPHRINE (PF) 0.25% -1:200000 IJ SOLN
INTRAMUSCULAR | Status: DC | PRN
  Administered 2015-08-09: 30 mL

## 2015-08-09 MED ORDER — FAMOTIDINE 20 MG PO TABS
20.0000 mg | ORAL_TABLET | Freq: Once | ORAL | Status: AC
  Administered 2015-08-09: 20 mg via ORAL

## 2015-08-09 MED ORDER — PANTOPRAZOLE SODIUM 40 MG IV SOLR
40.0000 mg | Freq: Two times a day (BID) | INTRAVENOUS | Status: DC
Start: 2015-08-09 — End: 2015-08-14
  Administered 2015-08-09 – 2015-08-14 (×11): 40 mg via INTRAVENOUS
  Filled 2015-08-09 (×11): qty 40

## 2015-08-09 MED ORDER — BUPIVACAINE-EPINEPHRINE (PF) 0.25% -1:200000 IJ SOLN
INTRAMUSCULAR | Status: AC
  Filled 2015-08-09: qty 30

## 2015-08-09 MED ORDER — HEPARIN SODIUM (PORCINE) 5000 UNIT/ML IJ SOLN
5000.0000 [IU] | Freq: Once | INTRAMUSCULAR | Status: AC
  Administered 2015-08-09: 5000 [IU] via SUBCUTANEOUS

## 2015-08-09 MED ORDER — FENTANYL CITRATE (PF) 100 MCG/2ML IJ SOLN
25.0000 ug | INTRAMUSCULAR | Status: AC | PRN
  Administered 2015-08-09 (×6): 25 ug via INTRAVENOUS

## 2015-08-09 MED ORDER — HYDROMORPHONE HCL 1 MG/ML IJ SOLN
0.5000 mg | INTRAMUSCULAR | Status: AC | PRN
  Administered 2015-08-09 (×4): 0.5 mg via INTRAVENOUS

## 2015-08-09 MED ORDER — FENTANYL CITRATE (PF) 100 MCG/2ML IJ SOLN
INTRAMUSCULAR | Status: DC | PRN
  Administered 2015-08-09: 50 ug via INTRAVENOUS
  Administered 2015-08-09: 100 ug via INTRAVENOUS
  Administered 2015-08-09 (×5): 50 ug via INTRAVENOUS

## 2015-08-09 MED ORDER — CHLORHEXIDINE GLUCONATE 4 % EX LIQD: 1.0000 "application " | Freq: Once | CUTANEOUS | Status: DC

## 2015-08-09 MED ORDER — KCL IN DEXTROSE-NACL 20-5-0.45 MEQ/L-%-% IV SOLN
INTRAVENOUS | Status: DC
  Administered 2015-08-09 – 2015-08-14 (×12): via INTRAVENOUS
  Filled 2015-08-09 (×15): qty 1000

## 2015-08-09 MED ORDER — HEPARIN SODIUM (PORCINE) 5000 UNIT/ML IJ SOLN
INTRAMUSCULAR | Status: AC
  Administered 2015-08-09: 5000 [IU] via SUBCUTANEOUS
  Filled 2015-08-09: qty 1

## 2015-08-09 MED ORDER — MORPHINE SULFATE (PF) 2 MG/ML IV SOLN
2.0000 mg | INTRAVENOUS | Status: DC | PRN
  Administered 2015-08-09 – 2015-08-13 (×20): 2 mg via INTRAVENOUS
  Filled 2015-08-09 (×21): qty 1

## 2015-08-09 MED ORDER — FENTANYL CITRATE (PF) 100 MCG/2ML IJ SOLN
INTRAMUSCULAR | Status: AC
  Administered 2015-08-09: 25 ug via INTRAVENOUS
  Filled 2015-08-09: qty 2

## 2015-08-09 MED ORDER — HEPARIN SODIUM (PORCINE) 5000 UNIT/ML IJ SOLN
5000.0000 [IU] | Freq: Three times a day (TID) | INTRAMUSCULAR | Status: DC
  Administered 2015-08-09 – 2015-08-14 (×15): 5000 [IU] via SUBCUTANEOUS
  Filled 2015-08-09 (×15): qty 1

## 2015-08-09 MED ORDER — ESMOLOL HCL 100 MG/10ML IV SOLN
INTRAVENOUS | Status: DC | PRN
  Administered 2015-08-09: 20 mg via INTRAVENOUS
  Administered 2015-08-09: 30 mg via INTRAVENOUS
  Administered 2015-08-09: 20 mg via INTRAVENOUS

## 2015-08-09 MED ORDER — MIDAZOLAM HCL 2 MG/2ML IJ SOLN
INTRAMUSCULAR | Status: DC | PRN
  Administered 2015-08-09: 2 mg via INTRAVENOUS

## 2015-08-09 MED ORDER — LACTATED RINGERS IV SOLN
INTRAVENOUS | Status: DC
  Administered 2015-08-09 (×2): via INTRAVENOUS

## 2015-08-09 MED ORDER — ONDANSETRON HCL 4 MG/2ML IJ SOLN
INTRAMUSCULAR | Status: DC | PRN
  Administered 2015-08-09: 4 mg via INTRAVENOUS

## 2015-08-09 MED ORDER — HYDROMORPHONE HCL 1 MG/ML IJ SOLN
INTRAMUSCULAR | Status: AC
  Administered 2015-08-09: 0.5 mg via INTRAVENOUS
  Filled 2015-08-09: qty 1

## 2015-08-09 MED ORDER — LIDOCAINE HCL (CARDIAC) 20 MG/ML IV SOLN
INTRAVENOUS | Status: DC | PRN
Start: 2015-08-09 — End: 2015-08-09
  Administered 2015-08-09: 60 mg via INTRAVENOUS

## 2015-08-09 MED ORDER — DEXAMETHASONE SODIUM PHOSPHATE 10 MG/ML IJ SOLN
INTRAMUSCULAR | Status: DC | PRN
  Administered 2015-08-09: 5 mg via INTRAVENOUS

## 2015-08-09 MED ORDER — KETOROLAC TROMETHAMINE 30 MG/ML IJ SOLN
INTRAMUSCULAR | Status: DC | PRN
  Administered 2015-08-09: 30 mg via INTRAVENOUS

## 2015-08-09 MED ORDER — ROCURONIUM BROMIDE 100 MG/10ML IV SOLN
INTRAVENOUS | Status: DC | PRN
  Administered 2015-08-09 (×3): 10 mg via INTRAVENOUS
  Administered 2015-08-09: 40 mg via INTRAVENOUS

## 2015-08-09 MED ORDER — SUGAMMADEX SODIUM 500 MG/5ML IV SOLN
INTRAVENOUS | Status: DC | PRN
  Administered 2015-08-09: 102.6 mg via INTRAVENOUS

## 2015-08-09 MED ORDER — SODIUM CHLORIDE 0.9 % IV SOLN
1.0000 g | INTRAVENOUS | Status: AC
  Administered 2015-08-09: 1 g via INTRAVENOUS
  Filled 2015-08-09: qty 1

## 2015-08-09 MED ORDER — PHENYLEPHRINE HCL 10 MG/ML IJ SOLN
INTRAMUSCULAR | Status: DC | PRN
  Administered 2015-08-09: 200 ug via INTRAVENOUS
  Administered 2015-08-09 (×3): 100 ug via INTRAVENOUS

## 2015-08-09 MED ORDER — ONDANSETRON HCL 4 MG/2ML IJ SOLN: 4.0000 mg | Freq: Once | INTRAMUSCULAR | Status: DC | PRN

## 2015-08-09 MED ORDER — FAMOTIDINE 20 MG PO TABS
ORAL_TABLET | ORAL | Status: AC
  Administered 2015-08-09: 20 mg via ORAL
  Filled 2015-08-09: qty 1

## 2015-08-09 SURGICAL SUPPLY — 51 items
ADHESIVE MASTISOL STRL (MISCELLANEOUS) IMPLANT
CANISTER SUCT 1200ML W/VALVE (MISCELLANEOUS) ×3 IMPLANT
CATH TRAY 16F METER LATEX (MISCELLANEOUS) ×3 IMPLANT
CHLORAPREP W/TINT 26ML (MISCELLANEOUS) ×3 IMPLANT
CLOSURE WOUND 1/2 X4 (GAUZE/BANDAGES/DRESSINGS)
COVER CLAMP SIL LG PBX B (MISCELLANEOUS) IMPLANT
DRAPE LAPAROTOMY 100X77 ABD (DRAPES) ×3 IMPLANT
DRAPE LEGGINS SURG 28X43 STRL (DRAPES) IMPLANT
DRSG OPSITE POSTOP 4X10 (GAUZE/BANDAGES/DRESSINGS) ×3 IMPLANT
DRSG OPSITE POSTOP 4X8 (GAUZE/BANDAGES/DRESSINGS) ×3 IMPLANT
DRSG TELFA 3X8 NADH (GAUZE/BANDAGES/DRESSINGS) ×3 IMPLANT
ELECT CAUTERY BLADE 6.4 (BLADE) ×3 IMPLANT
ELECT REM PT RETURN 9FT ADLT (ELECTROSURGICAL) ×3
ELECTRODE REM PT RTRN 9FT ADLT (ELECTROSURGICAL) ×1 IMPLANT
GAUZE SPONGE 4X4 12PLY STRL (GAUZE/BANDAGES/DRESSINGS) IMPLANT
GLOVE BIO SURGEON STRL SZ8 (GLOVE) ×24 IMPLANT
GLOVE INDICATOR 8.0 STRL GRN (GLOVE) ×12 IMPLANT
GOWN STRL REUS W/ TWL LRG LVL3 (GOWN DISPOSABLE) ×4 IMPLANT
GOWN STRL REUS W/ TWL XL LVL3 (GOWN DISPOSABLE) ×2 IMPLANT
GOWN STRL REUS W/TWL LRG LVL3 (GOWN DISPOSABLE) ×8
GOWN STRL REUS W/TWL XL LVL3 (GOWN DISPOSABLE) ×4
KIT CATH CVC 3 LUMEN 7FR 8IN (MISCELLANEOUS) ×3 IMPLANT
KIT RM TURNOVER STRD PROC AR (KITS) ×3 IMPLANT
LABEL OR SOLS (LABEL) ×3 IMPLANT
NDL SAFETY 22GX1.5 (NEEDLE) ×3 IMPLANT
NS IRRIG 1000ML POUR BTL (IV SOLUTION) ×3 IMPLANT
PACK BASIN MAJOR ARMC (MISCELLANEOUS) ×3 IMPLANT
PACK COLON CLEAN CLOSURE (MISCELLANEOUS) ×3 IMPLANT
RELOAD PROXIMATE 30MM BLUE (ENDOMECHANICALS) IMPLANT
RELOAD PROXIMATE 75MM BLUE (ENDOMECHANICALS) ×6 IMPLANT
RELOAD STAPLER LINEAR PROX 30 (STAPLE) IMPLANT
SEPRAFILM MEMBRANE 5X6 (MISCELLANEOUS) ×3 IMPLANT
SET YANKAUER POOLE SUCT (MISCELLANEOUS) ×3 IMPLANT
SPONGE LAP 18X18 5 PK (GAUZE/BANDAGES/DRESSINGS) ×3 IMPLANT
STAPLER PROXIMATE 75MM BLUE (STAPLE) ×3 IMPLANT
STAPLER RELOAD LINEAR PROX 30 (STAPLE)
STAPLER SKIN PROX 35W (STAPLE) ×3 IMPLANT
STRIP CLOSURE SKIN 1/2X4 (GAUZE/BANDAGES/DRESSINGS) IMPLANT
SUT MNCRL 3 0 RB1 (SUTURE) ×2 IMPLANT
SUT MNCRL 3-0 UNDYED SH (SUTURE) ×2 IMPLANT
SUT MONOCRYL 3 0 RB1 (SUTURE) ×4
SUT MONOCRYL 3-0 UNDYED (SUTURE) ×4
SUT PDS AB 1 CT1 27 (SUTURE) ×3 IMPLANT
SUT PDS AB 1 TP1 54 (SUTURE) ×6 IMPLANT
SUT SILK 0 (SUTURE) ×2
SUT SILK 0 30XBRD TIE 6 (SUTURE) ×1 IMPLANT
SUT SILK 1 SH (SUTURE) ×3 IMPLANT
SUT SILK 3-0 (SUTURE) ×30 IMPLANT
SUT VIC AB 1 CTX 27 (SUTURE) ×9 IMPLANT
SUT VICRYL 0 TIES 12 18 (SUTURE) ×6 IMPLANT
SYRINGE 10CC LL (SYRINGE) ×3 IMPLANT

## 2015-08-09 NOTE — Progress Notes (Signed)
Patient seen postoperatively and she is doing quite well having no pain or very minimal pain will continue current care.

## 2015-08-09 NOTE — Transfer of Care (Signed)
Immediate Anesthesia Transfer of Care Note  Patient: Nancy Clark  Procedure(s) Performed: Procedure(s): COLON RESECTION RIGHT (Right)  Patient Location: PACU  Anesthesia Type:General  Level of Consciousness: awake, alert  and oriented  Airway & Oxygen Therapy: Patient Spontanous Breathing and Patient connected to face mask oxygen  Post-op Assessment: Report given to RN and Post -op Vital signs reviewed and stable  Post vital signs: Reviewed and stable  Last Vitals:  Filed Vitals:   08/09/15 0940 08/09/15 1203  BP:  179/98  Pulse:  113  Temp: 37 C 37.8 C  Resp:  16    Complications: No apparent anesthesia complications

## 2015-08-09 NOTE — Anesthesia Postprocedure Evaluation (Signed)
Anesthesia Post Note  Patient: Nancy Clark  Procedure(s) Performed: Procedure(s) (LRB): COLON RESECTION RIGHT (Right)  Patient location during evaluation: PACU Anesthesia Type: General Level of consciousness: awake and alert Pain management: pain level controlled Vital Signs Assessment: post-procedure vital signs reviewed and stable Respiratory status: spontaneous breathing, nonlabored ventilation, respiratory function stable and patient connected to nasal cannula oxygen Cardiovascular status: blood pressure returned to baseline and stable Postop Assessment: no signs of nausea or vomiting Anesthetic complications: no    Last Vitals:  Filed Vitals:   08/09/15 1603 08/09/15 2016  BP: 122/69 118/61  Pulse: 75 70  Temp: 37 C 36.9 C  Resp: 19 16    Last Pain:  Filed Vitals:   08/09/15 2031  PainSc: [place]

## 2015-08-09 NOTE — Op Note (Signed)
Pre-operative Diagnosis: Right colon cancer  Post-operative Diagnosis: Right colon cancer  Surgeon: Phoebe Perch   Assistants: Marta Lamas  Surgeon: Jerrol Banana. Burt Knack, MD FACS  Anesthesia: Gen. with endotracheal tube   Procedure Details  The patient was seen again in the Holding Room. The benefits, complications, treatment options, and expected outcomes were discussed with the patient. The risks of bleeding, infection, recurrence of symptoms, failure to resolve symptoms,  bowel injury, any of which could require further surgery were reviewed with the patient.  The preoperative findings of probable and likely metastatic disease in lymph nodes along the aorta were discussed with her and the fact that these would not be removed was discussed as well. The patient was taken to Operating Room, identified as Nancy Clark and the procedure verified.  A Time Out was held and the above information confirmed.  Prior to the induction of general anesthesia, antibiotic prophylaxis was administered. VTE prophylaxis was in place. General endotracheal anesthesia was then administered and tolerated well. After the induction, the abdomen was prepped with Chloraprep and draped in the sterile fashion. The patient was positioned in the supine position.  Transverse incision infraumbilically was made with electrocautery and the abdominal cavity was opened and explored a sub-centimeter lesion was noted near the dome of the liver and could not be visualized nor characterized.  The large mass in the right colon was palpable and visible and fixed to the retroperitoneum with tumor extending along the lymphatic tract to the aorta where it was fixed to the aorta. Multiple lymph nodes were noted as well. The gallbladder was normal. The remainder of the abdominal exploration was normal.  Colon resection was then performed by taking down the avascular line of Toldt and identifying the ureter and the gonadal vessels. These  were intimately associated with what appeared to be tumor on the backside of the right colon. Dissection along the terminal ileum was performed and the terminal ileum was involved in this tumor as well a GIA was utilized to divide proximal more proximally than a large lymph node that was present.  The transverse colon was then mobilized from the hepatic flexure and a site was identified for placement of a GIA stapler which was performed. The specimen was then further elevated until the last portion of its adherent portion was on the aorta this was gross tumor growing onto and possibly into the aorta A clamp was placed across across this and it was divided sharply. This was handled with a #1 silk suture ligature no bleeding was noted. Other vessels were handled with 0 silk ligatures of the mesenteric vessels. The specimen was sent off for examination. Further x-rays in the retroperitoneum demonstrated extensive tumor burden in lymph nodes up and along the aorta  The ureter was identified and had not been injured. An anastomosis was then performed in a handsewn side-to-side fashion by choosing a site and it was noted that the staple line on the small bowel was somewhat dusky therefore it was resected approximately 1/2 cm proximal with a GIA again and divided with clamps and sent off with the other specimen. Anastomosis was then performed after ensuring that there was no sign of ischemic changes. This is done in a handsewn anastomosis fashion utilizing outer layers of 3-0 silk centimeter layers of 3-0 Monocryl the anastomosis was adequate and there was no sign of twisting of the mesentery or the bowel. This checked multiple times. Closure of the right upper quadrant internal hernia created by  the anastomosis was performed with figure-of-eight 3-0 silk and no bowel could exude through the internal hernia that had been closed into the right upper quadrant.  This. We'll count was correct gown and gloves were changed  and the wound was closed over a piece of Seprafilm with running #1 PDS. The sponge lap needle count was correct the wound was closed with the PDS and then Marcaine was placed for a total of 30 cc the skin was irrigated and skin staples were placed as was sterile dressing  Patient out of the procedure well the workup location she was taken to recovery room in stable condition to be admitted for continued care as me blood loss 100 cc  Findings: As above   Estimated Blood Loss: 100 cc         Drains: None         Specimens: Right colon and distal small bowel segment        Complications: None                  Condition: Stable   Siddhanth Denk E. Burt Knack, MD, FACS

## 2015-08-09 NOTE — Anesthesia Procedure Notes (Signed)
Procedure Name: Intubation Performed by: Demetrius Charity Pre-anesthesia Checklist: Patient identified, Patient being monitored, Timeout performed, Emergency Drugs available and Suction available Patient Re-evaluated:Patient Re-evaluated prior to inductionOxygen Delivery Method: Circle system utilized Preoxygenation: Pre-oxygenation with 100% oxygen Intubation Type: IV induction Ventilation: Mask ventilation without difficulty Laryngoscope Size: Mac and 3 Grade View: Grade I Tube type: Oral Tube size: 7.0 mm Number of attempts: 1 Airway Equipment and Method: Stylet Placement Confirmation: ETT inserted through vocal cords under direct vision,  positive ETCO2 and breath sounds checked- equal and bilateral Secured at: 19 cm Tube secured with: Tape Dental Injury: Teeth and Oropharynx as per pre-operative assessment

## 2015-08-09 NOTE — Anesthesia Preprocedure Evaluation (Addendum)
Anesthesia Evaluation  Patient identified by MRN, date of birth, ID band Patient awake    Reviewed: Allergy & Precautions, NPO status , Patient's Chart, lab work & pertinent test results, reviewed documented beta blocker date and time   Airway Mallampati: II  TM Distance: >3 FB     Dental  (+) Chipped, Missing, Partial Lower, Dental Advisory Given   Pulmonary Current Smoker,           Cardiovascular      Neuro/Psych    GI/Hepatic   Endo/Other    Renal/GU      Musculoskeletal   Abdominal   Peds  Hematology   Anesthesia Other Findings Colon ca.  Reproductive/Obstetrics                           Anesthesia Physical Anesthesia Plan  ASA: III  Anesthesia Plan: General   Post-op Pain Management:    Induction: Intravenous  Airway Management Planned: Oral ETT  Additional Equipment:   Intra-op Plan:   Post-operative Plan:   Informed Consent: I have reviewed the patients History and Physical, chart, labs and discussed the procedure including the risks, benefits and alternatives for the proposed anesthesia with the patient or authorized representative who has indicated his/her understanding and acceptance.     Plan Discussed with: CRNA  Anesthesia Plan Comments:         Anesthesia Quick Evaluation

## 2015-08-09 NOTE — Progress Notes (Signed)
Preoperative Review   Patient is met in the preoperative holding area. The history is reviewed in the chart and with the patient. I personally reviewed the options and rationale as well as the risks of this procedure that have been previously discussed with the patient. All questions asked by the patient and/or family were answered to their satisfaction.  Patient agrees to proceed with this procedure at this time.  Aramis Zobel E Tedi Hughson M.D. FACS  

## 2015-08-10 LAB — BASIC METABOLIC PANEL
ANION GAP: 4 — AB (ref 5–15)
BUN: 11 mg/dL (ref 6–20)
CHLORIDE: 106 mmol/L (ref 101–111)
CO2: 23 mmol/L (ref 22–32)
Calcium: 7.9 mg/dL — ABNORMAL LOW (ref 8.9–10.3)
Creatinine, Ser: 0.63 mg/dL (ref 0.44–1.00)
GFR calc Af Amer: >60 mL/min (ref >60)
GFR calc non Af Amer: >60 mL/min (ref >60)
GLUCOSE: 130 mg/dL — AB (ref 65–99)
POTASSIUM: 4.1 mmol/L (ref 3.5–5.1)
SODIUM: 133 mmol/L — AB (ref 135–145)

## 2015-08-10 LAB — CBC
HEMATOCRIT: 33.6 % — AB (ref 35.0–47.0)
HEMOGLOBIN: 11.1 g/dL — AB (ref 12.0–16.0)
MCH: 26.6 pg (ref 26.0–34.0)
MCHC: 32.9 g/dL (ref 32.0–36.0)
MCV: 80.9 fL (ref 80.0–100.0)
Platelets: 172 10*3/uL (ref 150–440)
RBC: 4.16 MIL/uL (ref 3.80–5.20)
RDW: 17 % — AB (ref 11.5–14.5)
WBC: 10.5 10*3/uL (ref 3.6–11.0)

## 2015-08-10 NOTE — Progress Notes (Signed)
1 Day Post-Op  Subjective: Postop right colon resection with likely metastatic cancer. She has no complaints today is having minimal pain not passing any gas.  Objective: Vital signs in last 24 hours: Temp:  [98 F (36.7 C)-100 F (37.8 C)] 98.1 F (36.7 C) (03/21 0720) Pulse Rate:  [64-118] 64 (03/21 0720) Resp:  [12-20] 16 (03/21 0720) BP: (118-179)/(61-98) 130/62 mmHg (03/21 0720) SpO2:  [95 %-100 %] 100 % (03/21 0720) Last BM Date: 08/08/15  Intake/Output from previous day: 03/20 0701 - 03/21 0700 In: 3305 [I.V.:3205; NG/GT:100] Out: 550 [Urine:430; Emesis/NG output:100; Blood:20] Intake/Output this shift: Total I/O In: -  Out: 132 [Urine:132]  Physical exam:  Soft abdomen wound clean no erythema no drainage nontender calves  Lab Results: CBC   Recent Labs  08/09/15 1442 08/10/15 0621  WBC 8.9 10.5  HGB 12.5 11.1*  HCT 38.7 33.6*  PLT 187 172   BMET  Recent Labs  08/09/15 1442 08/10/15 0621  NA  --  133*  K  --  4.1  CL  --  106  CO2  --  23  GLUCOSE  --  130*  BUN  --  11  CREATININE 0.79 0.63  CALCIUM  --  7.9*   PT/INR No results for input(s): LABPROT, INR in the last 72 hours. ABG No results for input(s): PHART, HCO3 in the last 72 hours.  Invalid input(s): PCO2, PO2  Studies/Results: No results found.  Anti-infectives: Anti-infectives    Start     Dose/Rate Route Frequency Ordered Stop   08/09/15 0808  ertapenem [facility]) 1 g in sodium chloride 0.9 % 50 mL IVPB     1 g 100 mL/hr over 30 Minutes Intravenous On call to O.R. 08/09/15 0808 08/09/15 1021      Assessment/Plan: s/p Procedure(s): COLON RESECTION RIGHT   Continue NG tube for now until passing gas.  Florene Glen, MD, FACS  08/10/2015

## 2015-08-10 NOTE — Progress Notes (Signed)
Initial Nutrition Assessment     INTERVENTION:  Meals and snacks: Await diet progression   NUTRITION DIAGNOSIS:   Inadequate oral intake related to altered GI function as evidenced by NPO status.    GOAL:   Patient will meet greater than or equal to 90% of their needs    MONITOR:   PO intake, Diet advancement  REASON FOR ASSESSMENT:   Malnutrition Screening Tool    ASSESSMENT:      Pt s/p right colon resection secondary to cancer  Past Medical History  Diagnosis Date  . Chicken pox   . Cancer [facility])     recently cancer of right colon cancer    Current Nutrition: NPO  Food/Nutrition-Related History: pt reports for the past month poor po intake    Scheduled Medications:  . chlorhexidine  1 application Topical Once  . heparin  5,000 Units Subcutaneous 3 times per day  . pantoprazole (PROTONIX) IV  40 mg Intravenous Q12H    Continuous Medications:  . dextrose 5 % and 0.45 % NaCl with KCl 20 mEq/L 100 mL/hr at 08/10/15 1423  . lactated ringers 75 mL/hr at 08/09/15 0859     Electrolyte/Renal Profile and Glucose Profile:   Recent Labs Lab 08/09/15 1442 08/10/15 0621  NA  --  133*  K  --  4.1  CL  --  106  CO2  --  23  BUN  --  11  CREATININE 0.79 0.63  CALCIUM  --  7.9*  GLUCOSE  --  130*    Gastrointestinal Profile: Last BM: 3/19   Nutrition-Focused Physical Exam Findings: Nutrition-Focused physical exam completed. Findings are WDL for fat depletion, muscle depletion, and edema.     Weight Change: noted 3% wt loss in the last month    Diet Order:   NPO  Skin:   reviewed   Height:   Ht Readings from Last 1 Encounters:  08/09/15 5' (1.524 m)    Weight:   Wt Readings from Last 1 Encounters:  08/09/15 113 lb (51.256 kg)    Ideal Body Weight:     BMI:  Body mass index is 22.07 kg/(m^2).  Estimated Nutritional Needs:   Kcal:  BEE 1041 kcals (IF 1.0-1.2, AF 1.3) [id number] kcals/d  Protein:  (1.0-1.2 g/kg) 51-61  g/d  Fluid:  (30-80ml/kg) 1530-1785 ml/d  EDUCATION NEEDS:   No education needs identified at this time  [place]. Zenia Resides, [place], [place] (pager) Weekend/On-Call pager [phone])

## 2015-08-10 NOTE — Progress Notes (Signed)
Patient A/O, no noted distress. Patient experiencing surgical pain. Administered prn pain regimen (see EMAR); noted effectiveness. Bladder scan patient to 113, readjusted cath, properly emptying. Please see flow sheet. NG continues to be emptying properly output on 7p-7a shift 100. NG is pinned to gown. Patient slept well through out the night. Resulted in mouth and throat dryness administered 4-5 ice chips and patient was given swabs to keep oral moist. Drainage is marked. Staff will continue to monitor and meet needs.

## 2015-08-11 LAB — BASIC METABOLIC PANEL
Anion gap: 5 (ref 5–15)
BUN: 7 mg/dL (ref 6–20)
CALCIUM: 8.1 mg/dL — AB (ref 8.9–10.3)
CO2: 24 mmol/L (ref 22–32)
CREATININE: 0.58 mg/dL (ref 0.44–1.00)
Chloride: 108 mmol/L (ref 101–111)
GFR calc Af Amer: >60 mL/min (ref >60)
GLUCOSE: 118 mg/dL — AB (ref 65–99)
Potassium: 3.7 mmol/L (ref 3.5–5.1)
Sodium: 137 mmol/L (ref 135–145)

## 2015-08-11 LAB — CBC WITH DIFFERENTIAL/PLATELET
Basophils Absolute: 0 10*3/uL (ref 0–0.1)
Basophils Relative: 0 %
EOS ABS: 0 10*3/uL (ref 0–0.7)
EOS PCT: 0 %
HCT: 32.4 % — ABNORMAL LOW (ref 35.0–47.0)
Hemoglobin: 10.6 g/dL — ABNORMAL LOW (ref 12.0–16.0)
LYMPHS ABS: 1.5 10*3/uL (ref 1.0–3.6)
LYMPHS PCT: 21 %
MCH: 26.9 pg (ref 26.0–34.0)
MCHC: 32.8 g/dL (ref 32.0–36.0)
MCV: 82.2 fL (ref 80.0–100.0)
MONOS PCT: 7 %
Monocytes Absolute: 0.5 10*3/uL (ref 0.2–0.9)
Neutro Abs: 5 10*3/uL (ref 1.4–6.5)
Neutrophils Relative %: 72 %
PLATELETS: 158 10*3/uL (ref 150–440)
RBC: 3.94 MIL/uL (ref 3.80–5.20)
RDW: 17.1 % — ABNORMAL HIGH (ref 11.5–14.5)
WBC: 6.9 10*3/uL (ref 3.6–11.0)

## 2015-08-11 MED ORDER — PHENOL 1.4 % MT LIQD
1.0000 | OROMUCOSAL | Status: DC | PRN
  Administered 2015-08-11 – 2015-08-12 (×2): 1 via OROMUCOSAL
  Filled 2015-08-11: qty 177

## 2015-08-11 NOTE — Progress Notes (Signed)
2 Days Post-Op  Subjective: Patient feels well today but not passing any gas no nausea or vomiting.  Objective: Vital signs in last 24 hours: Temp:  [97.4 F (36.3 C)-99.3 F (37.4 C)] 98.6 F (37 C) (03/22 0555) Pulse Rate:  [67-86] 82 (03/22 0555) Resp:  [18-20] 18 (03/22 0555) BP: (132-147)/(54-68) 147/68 mmHg (03/22 0555) SpO2:  [100 %] 100 % (03/22 0555) Last BM Date: 08/08/15  Intake/Output from previous day: 03/21 0701 - 03/22 0700 In: 2246.9 [P.O.:30; I.V.:2116.9; NG/GT:100] Out: 2007 [Urine:1482; Emesis/NG output:525] Intake/Output this shift: Total I/O In: 277.1 [I.V.:277.1] Out: 750 [Urine:700; Emesis/NG output:50]  Physical exam:  Abdomen is soft and minimally distended minimally tympanitic and nontender wound is clean. Nasogastric tube in place nontender calves  Lab Results: CBC   Recent Labs  08/10/15 0621 08/11/15 0523  WBC 10.5 6.9  HGB 11.1* 10.6*  HCT 33.6* 32.4*  PLT 172 158   BMET  Recent Labs  08/10/15 0621 08/11/15 0523  NA 133* 137  K 4.1 3.7  CL 106 108  CO2 23 24  GLUCOSE 130* 118*  BUN 11 7  CREATININE 0.63 0.58  CALCIUM 7.9* 8.1*   PT/INR No results for input(s): LABPROT, INR in the last 72 hours. ABG No results for input(s): PHART, HCO3 in the last 72 hours.  Invalid input(s): PCO2, PO2  Studies/Results: No results found.  Anti-infectives: Anti-infectives    Start     Dose/Rate Route Frequency Ordered Stop   08/09/15 0808  ertapenem [facility]) 1 g in sodium chloride 0.9 % 50 mL IVPB     1 g 100 mL/hr over 30 Minutes Intravenous On call to O.R. 08/09/15 0808 08/09/15 1021      Assessment/Plan: s/p Procedure(s): COLON RESECTION RIGHT   Patient doing quite well at this point pathology is currently pending. Suspect the patient has metastatic cancer from an ascending colon cancer. Awaiting bowel function return.Florene Glen, MD, FACS  08/11/2015

## 2015-08-12 ENCOUNTER — Inpatient Hospital Stay: Payer: 59

## 2015-08-12 NOTE — Consult Note (Signed)
   [facility] Inpatient Consult   08/12/2015  Nancy Clark 09/02/1960 [id number]    Patient screened for potential [facility] Management services. Patient is eligible for [place]. Epic reveals there were no identifiable [name] care management needs at this time. [facility] Care Management services not appropriate at this time. If patient's post hospital needs change please place a [facility] Care Management consult. For questions please contact:   Latorie Montesano RN, [facility] Liaison  [phone]) Business Mobile [phone]) Toll free office

## 2015-08-12 NOTE — Progress Notes (Signed)
3 Days Post-Op  Subjective: Patient frustrated with lack of progress and her chief complaint is her throat she has minimal abdominal complaints but is not passing gas yet again her chief complaint is the nasogastric tube in the back of her throat.  Objective: Vital signs in last 24 hours: Temp:  [98.1 F (36.7 C)-100 F (37.8 C)] 98.1 F (36.7 C) (03/23 0521) Pulse Rate:  [78-91] 78 (03/23 0521) Resp:  [16-20] 18 (03/23 0521) BP: (125-159)/(67-87) 134/72 mmHg (03/23 0521) SpO2:  [100 %] 100 % (03/23 0521) Last BM Date: 08/08/15  Intake/Output from previous day: 03/22 0701 - 03/23 0700 In: 1733.7 [I.V.:1673.7; NG/GT:60] Out: 3100 [Urine:2600; Emesis/NG output:500] Intake/Output this shift: Total I/O In: 1462.7 [I.V.:1282.7; NG/GT:180] Out: 50 [Emesis/NG output:50]  Physical exam:  Abdomen distended and tympanitic wound is clean no erythema or drainage nontender abdomen tender calves  Lab Results: CBC   Recent Labs  08/10/15 0621 08/11/15 0523  WBC 10.5 6.9  HGB 11.1* 10.6*  HCT 33.6* 32.4*  PLT 172 158   BMET  Recent Labs  08/10/15 0621 08/11/15 0523  NA 133* 137  K 4.1 3.7  CL 106 108  CO2 23 24  GLUCOSE 130* 118*  BUN 11 7  CREATININE 0.63 0.58  CALCIUM 7.9* 8.1*   PT/INR No results for input(s): LABPROT, INR in the last 72 hours. ABG No results for input(s): PHART, HCO3 in the last 72 hours.  Invalid input(s): PCO2, PO2  Studies/Results: No results found.  Anti-infectives: Anti-infectives    Start     Dose/Rate Route Frequency Ordered Stop   08/09/15 0808  ertapenem [facility]) 1 g in sodium chloride 0.9 % 50 mL IVPB     1 g 100 mL/hr over 30 Minutes Intravenous On call to O.R. 08/09/15 0808 08/09/15 1021      Assessment/Plan: s/p Procedure(s): COLON RESECTION RIGHT   Will obtain a KUB today as if there is gas progressing past the anastomosis into the transverse colon then we could remove the nasogastric tube and start her on clear  liquids will reevaluate later.  Florene Glen, MD, FACS  08/12/2015

## 2015-08-13 NOTE — Progress Notes (Signed)
4 Days Post-Op   Subjective:  55 year old female 4 days status post right hemicolectomy for colon cancer. Tolerated removal his NG tube and has been tolerating clear liquid diet. She denies any significant pain but when she does have pain is controlled with as needed medications. Denies any flatus yet.  Vital signs in last 24 hours: Temp:  [98.2 F (36.8 C)-98.7 F (37.1 C)] 98.2 F (36.8 C) (03/24 0542) Pulse Rate:  [72-86] 73 (03/24 1359) Resp:  [17-20] 17 (03/24 1359) BP: (114-149)/(65-84) 138/74 mmHg (03/24 1359) SpO2:  [97 %-100 %] 97 % (03/24 1359) Last BM Date: 08/08/15  Intake/Output from previous day: 08-18-2022 0701 - 03/24 0700 In: 3754.7 [I.V.:3574.7; NG/GT:180] Out: 500 [Urine:350; Emesis/NG output:150]  GI: Abdomen soft, nondistended, appropriately tender to palpation at incision without any evidence of peritonitis. No active drainage or erythema.  Lab Results:  CBC  Recent Labs  08/11/15 0523  WBC 6.9  HGB 10.6*  HCT 32.4*  PLT 158   CMP     Component Value Date/Time   NA 137 08/11/2015 0523   K 3.7 08/11/2015 0523   CL 108 08/11/2015 0523   CO2 24 08/11/2015 0523   GLUCOSE 118* 08/11/2015 0523   BUN 7 08/11/2015 0523   CREATININE 0.58 08/11/2015 0523   CALCIUM 8.1* 08/11/2015 0523   PROT 7.4 07/27/2015 1551   ALBUMIN 4.1 07/27/2015 1551   AST 15 07/27/2015 1551   ALT 11* 07/27/2015 1551   ALKPHOS 81 07/27/2015 1551   BILITOT 0.3 07/27/2015 1551   GFRNONAA >60 08/11/2015 0523   GFRAA >60 08/11/2015 0523   PT/INR No results for input(s): LABPROT, INR in the last 72 hours.  Studies/Results: Dg Abd 2 Views  18-Aug-2015  CLINICAL DATA:  Postop colon resection. EXAM: ABDOMEN - 2 VIEW COMPARISON:  CT scan 07/22/2015 FINDINGS: The NGT is kinked in the stomach. Dilated air-filled small bowel and colon most likely reflecting a postoperative ileus. No obvious free air. The lung bases are clear. Osteitis pubis noted. IMPRESSION: NG tube is slightly kinked in  the stomach. Postop ileus. Electronically Signed   By: Marijo Sanes M.D.   On: 18-Aug-2015 13:52    Assessment/Plan: 55 year old female 4 days status post right colectomy. Awaiting return of bowel function. Continue clear liquids until obvious return of bowel function. Encourage ambulation and incentive spirometer usage   Clayburn Pert, MD FACS General Surgeon  08/13/2015

## 2015-08-14 MED ORDER — HYDROCODONE-ACETAMINOPHEN 5-325 MG PO TABS: 1.0000 | ORAL_TABLET | ORAL | Status: DC | PRN

## 2015-08-14 NOTE — Progress Notes (Signed)
5 Days Post-Op   Subjective:  Patient reports feeling well this morning. She is been tolerating her clear liquid diet and passed flatus yesterday. She's been a bili in the halls with minimal assistance. Continues to improve.  Vital signs in last 24 hours: Temp:  [98.1 F (36.7 C)-99 F (37.2 C)] 98.1 F (36.7 C) (03/25 0503) Pulse Rate:  [70-78] 70 (03/25 0503) Resp:  [16-18] 16 (03/25 0503) BP: (134-150)/(65-74) 150/74 mmHg (03/25 0503) SpO2:  [97 %-100 %] 100 % (03/25 0503) Last BM Date: 08/08/15  Intake/Output from previous day: 03/24 0701 - 03/25 0700 In: 3407.3 [P.O.:820; I.V.:2587.3] Out: 800 [Urine:800]  Physical exam: Gen.: No acute distress  chest: Clear to auscultation Heart: Regular rate and rhythm GI: Abdomen is soft, appropriately tender palpation at the staple line, nondistended. Staples well approximated without any evidence of erythema or drainage from the transverse incision site.  Lab Results:  CBC No results for input(s): WBC, HGB, HCT, PLT in the last 72 hours. CMP     Component Value Date/Time   NA 137 08/11/2015 0523   K 3.7 08/11/2015 0523   CL 108 08/11/2015 0523   CO2 24 08/11/2015 0523   GLUCOSE 118* 08/11/2015 0523   BUN 7 08/11/2015 0523   CREATININE 0.58 08/11/2015 0523   CALCIUM 8.1* 08/11/2015 0523   PROT 7.4 07/27/2015 1551   ALBUMIN 4.1 07/27/2015 1551   AST 15 07/27/2015 1551   ALT 11* 07/27/2015 1551   ALKPHOS 81 07/27/2015 1551   BILITOT 0.3 07/27/2015 1551   GFRNONAA >60 08/11/2015 0523   GFRAA >60 08/11/2015 0523   PT/INR No results for input(s): LABPROT, INR in the last 72 hours.  Studies/Results: Dg Abd 2 Views  Aug 16, 2015  CLINICAL DATA:  Postop colon resection. EXAM: ABDOMEN - 2 VIEW COMPARISON:  CT scan 07/22/2015 FINDINGS: The NGT is kinked in the stomach. Dilated air-filled small bowel and colon most likely reflecting a postoperative ileus. No obvious free air. The lung bases are clear. Osteitis pubis noted.  IMPRESSION: NG tube is slightly kinked in the stomach. Postop ileus. Electronically Signed   By: Marijo Sanes M.D.   On: August 16, 2015 13:52    Assessment/Plan: 55 year old female postop day 4 from a right hemicolectomy. Passed flatus last night and has evidence of returning bowel function. Plan advance diet to regular today. We'll saline lock her IV. Possible discharge home if able to tolerate a regular diet, and had pain controlled on oral medications.   Clayburn Pert, MD FACS General Surgeon  08/14/2015

## 2015-08-14 NOTE — Discharge Instructions (Signed)
Open Colectomy, Care After °Refer to this sheet in the next few weeks. These instructions provide you with information on caring for yourself after your procedure. Your health care provider may also give you more specific instructions. Your treatment has been planned according to current medical practices, but problems sometimes occur. Call your health care provider if you have any problems or questions after your procedure. °WHAT TO EXPECT AFTER THE PROCEDURE °After your procedure, it is typical to have the following: °· Pain in your abdomen, especially along your incision. You will be given medicines to control the pain. °· Tiredness. This is a normal part of the recovery process. Your energy level will return to normal over the next several weeks. °· Constipation. You may be given a stool softener to prevent this. °HOME CARE INSTRUCTIONS °· Only take over-the-counter or prescription medicines as directed by your health care provider. °· Ask your health care provider whether you may take a shower when you go home. °· You may resume a normal diet and activities as directed. Eat plenty of fruits and vegetables to help prevent constipation. °· Drink enough fluids to keep your urine clear or pale yellow. This also helps prevent constipation. °· Take rest breaks during the day as needed. °· Avoid lifting anything heavier than 25 pounds (11.3 kg) or driving for 4 weeks or until your health care provider says it is okay. °· Follow up with your health care provider as directed. Ask your health care provider when you need to return to have your stitches or staples removed. °SEEK MEDICAL CARE IF: °· You have redness, swelling, or increasing pain in the incision area. °· You see pus coming from the incision area. °· You have a fever. °SEEK IMMEDIATE MEDICAL CARE IF:  °· You have chest pain or shortness of breath. °· You have pain or swelling in your legs. °· You have persistent nausea and vomiting. °· Your wound breaks open  after stitches or staples have been removed. °· You have increasing abdominal pain that is not relieved with medicine. °  °This information is not intended to replace advice given to you by your health care provider. Make sure you discuss any questions you have with your health care provider. °  °Document Released: 11/29/2010 Document Revised: 02/26/2013 Document Reviewed: 12/18/2012 °Elsevier Interactive Patient Education ©2016 [employer]vier Inc. ° °

## 2015-08-14 NOTE — Progress Notes (Signed)
Alert and oriented. Vital signs stable . No signs of acute distress. Discharge instructions given. Patient verbalizes understanding. . Surgical dressing dry and intact. No other issues noted at this time.

## 2015-08-14 NOTE — Discharge Summary (Signed)
Patient ID: Nancy Clark MRN: [id number] DOB/AGE: 53-Dec-1962 53 y.o.  Admit date: 08/09/2015 Discharge date: 08/14/2015  Discharge Diagnoses:  Colon Cancer   Procedures Performed: Right hemicolectomy  Discharged Condition: good  Hospital Course: Underwent right hemicolectomy for colon cancer.  Tolerated surgery well. Able to ambulate, tolerate a diet and have bowel function prior to discharge.  Discharge Orders: Discharge Home  Disposition: Home  Discharge Medications:   Medication List    STOP taking these medications        bisacodyl 5 MG EC tablet  Generic drug:  bisacodyl     polyethylene glycol powder powder  Commonly known as:  GLYCOLAX/MIRALAX      TAKE these medications        ferrous sulfate 325 (65 FE) MG tablet  Take 1 tablet (325 mg total) by mouth 2 (two) times daily.     HYDROcodone-acetaminophen 5-325 MG tablet  Commonly known as:  NORCO/VICODIN  Take 1-2 tablets by mouth every 4 (four) hours as needed for moderate pain or severe pain.         Follwup: Appointment is already made for 10:15AM on 08/30/15 Follow-up Information    Follow up with Phoebe Perch, MD. Go in 16 days.   Specialty:  Surgery   Why:  For wound re-check   Contact information:   [address] [place] [address] [phone]       Signed: Clayburn Pert 08/14/2015, 3:27 PM

## 2015-08-16 ENCOUNTER — Inpatient Hospital Stay: Payer: 59 | Attending: Internal Medicine | Admitting: Internal Medicine

## 2015-08-16 ENCOUNTER — Other Ambulatory Visit: Payer: Self-pay | Admitting: Family Medicine

## 2015-08-16 ENCOUNTER — Telehealth: Payer: Self-pay

## 2015-08-16 VITALS — BP 139/93 | HR 143 | Temp 97.3°F | Resp 16 | Wt 104.1 lb

## 2015-08-16 DIAGNOSIS — Z79899 Other long term (current) drug therapy: Secondary | ICD-10-CM | POA: Insufficient documentation

## 2015-08-16 DIAGNOSIS — R928 Other abnormal and inconclusive findings on diagnostic imaging of breast: Secondary | ICD-10-CM

## 2015-08-16 DIAGNOSIS — Z9049 Acquired absence of other specified parts of digestive tract: Secondary | ICD-10-CM | POA: Diagnosis not present

## 2015-08-16 DIAGNOSIS — F17210 Nicotine dependence, cigarettes, uncomplicated: Secondary | ICD-10-CM | POA: Diagnosis not present

## 2015-08-16 DIAGNOSIS — R630 Anorexia: Secondary | ICD-10-CM | POA: Diagnosis not present

## 2015-08-16 DIAGNOSIS — C182 Malignant neoplasm of ascending colon: Secondary | ICD-10-CM | POA: Diagnosis not present

## 2015-08-16 DIAGNOSIS — R109 Unspecified abdominal pain: Secondary | ICD-10-CM | POA: Diagnosis not present

## 2015-08-16 DIAGNOSIS — Z8042 Family history of malignant neoplasm of prostate: Secondary | ICD-10-CM | POA: Diagnosis not present

## 2015-08-16 DIAGNOSIS — Z800 Family history of malignant neoplasm of digestive organs: Secondary | ICD-10-CM | POA: Diagnosis not present

## 2015-08-16 NOTE — Telephone Encounter (Signed)
Transition Care Management Follow-up Telephone Call   Date discharged? 08/14/15   How have you been since you were released from the hospital? Not resting well, hard to get comfortable.  Incision dry and intact.  Lack of appetite.  Drinking fluids okay. Voiding without issues.  ABD pain with a "feeling" of stomach in knots accompanied by diarrhea.  Some pain.   Do you understand why you were in the hospital? YES, I was diagnosed with colon CA and needed a right hemicolectomy.   Do you understand the discharge instructions? YES, move slowly between activities, no heavy lifting, no driving.   Where were you discharged to? Home.   Items Reviewed:  Medications reviewed: YES, taking HYDROcodone-acetaminophen 5-325mg  tablet and ferrous sulfate 325mg  tablet.  Stopped taking bisacodyl 5mg  EC tablet and Miralax powder.  Allergies reviewed: YES, no changes.  Dietary changes reviewed: YES, regular diet.    Referrals reviewed: YES, Oncology and appointment with PCP scheduled.   Functional Questionnaire:   Activities of Daily Living (ADLs):   She states they are independent in the following: Independent in all ADLs. States they require assistance with the following: Does not require assistance at this time.   Any transportation issues/concerns?: None.    Any patient concerns? Lack of appetite and continued diarrhea.   Confirmed importance and date/time of follow-up visits scheduled YES, appointment scheduled 08/20/15 at 9:00.  Provider Appointment booked with  Dr. Lacinda Axon (PCP).  Confirmed with patient if condition begins to worsen call PCP or go to the ER.  Patient was given the office number and encouraged to call back with question or concerns.  :  YES, patient verbalized understanding.

## 2015-08-16 NOTE — Progress Notes (Signed)
[place] NOTE  Patient Care Team: Coral Spikes, DO as PCP - General (Family Medicine)  CHIEF COMPLAINTS/PURPOSE OF CONSULTATION:   # [facility] 2017- COLON CANCER-  [s/p right hemi-colectomy; pT4apN2 (7/19LN)]; MSI testing pending. Pre-op CEA- 7. PET- pending.   #   HISTORY OF PRESENTING ILLNESS:  Nancy Clark 55 y.o.  female  with a newly diagnosed right-sided colon cancer status post right hemicolectomy is here to review the results of the pathology.   Patient is recuperating fairly well from her surgery. Admits to poor appetite. Otherwise no nausea vomiting. She admits to intermittent abdominal pain. Otherwise no diarrhea or constipation. Mild fatigue.  ROS: A complete 10 point review of system is done which is negative except mentioned above in history of present illness  MEDICAL HISTORY:  Past Medical History  Diagnosis Date  . Chicken pox   . Cancer [facility])     recently cancer of right colon cancer    SURGICAL HISTORY: Past Surgical History  Procedure Laterality Date  . Shoulder surgery Left   . Ectopic pregnancy surgery    . Colonoscopy    . Breast biopsy Right     2007? pt unsure done by [facility]  . Colostomy revision Right 08/09/2015    Procedure: COLON RESECTION RIGHT;  Surgeon: Florene Glen, MD;  Location: [facility] ORS;  Service: General;  Laterality: Right;    SOCIAL HISTORY: [place]; enviormental in [place].  1 pack in 3 days; no alcohol. 2 childern [25 & 17 years] Social History   Social History  . Marital Status: Married    Spouse Name: N/A  . Number of Children: N/A  . Years of Education: N/A   Occupational History  . Not on file.   Social History Main Topics  . Smoking status: Current Some Day Smoker -- 0.25 packs/day for 2 years  . Smokeless tobacco: Never Used  . Alcohol Use: No  . Drug Use: No  . Sexual Activity: Yes   Other Topics Concern  . Not on file   Social History Narrative    FAMILY HISTORY: 1 brother&  1 sister- no cancer. Mom-no cancer Family History  Problem Relation Age of Onset  . Hypertension Mother   . Arthritis Father   . Prostate cancer Father   . Diabetes Maternal Grandmother   . Cancer Maternal Grandfather     colon    ALLERGIES:  has No Known Allergies.  MEDICATIONS:  Current Outpatient Prescriptions  Medication Sig Dispense Refill  . ferrous sulfate 325 (65 FE) MG tablet Take 1 tablet (325 mg total) by mouth 2 (two) times daily. (Patient not taking: Reported on 08/16/2015) 60 tablet 1  . HYDROcodone-acetaminophen (NORCO/VICODIN) 5-325 MG tablet Take 1-2 tablets by mouth every 4 (four) hours as needed for moderate pain or severe pain. (Patient not taking: Reported on 08/16/2015) 30 tablet 0   No current facility-administered medications for this visit.      Marland Kitchen  PHYSICAL EXAMINATION: ECOG PERFORMANCE STATUS: 0 - Asymptomatic  Filed Vitals:   08/16/15 1507  BP: 139/93  Pulse: 143  Temp: 97.3 F (36.3 C)  Resp: 16   Filed Weights   08/16/15 1507  Weight: 104 lb 0.9 oz (47.2 kg)    GENERAL: Well-nourished well-developed; thin built; Alert, no distress and comfortable.   Accompanied by her mother.  EYES: no pallor or icterus OROPHARYNX: no thrush or ulceration; good dentition  NECK: supple, no masses felt LYMPH:  no palpable lymphadenopathy in  the cervical, axillary or inguinal regions LUNGS: clear to auscultation and  No wheeze or crackles HEART/CVS: regular rate & rhythm and no murmurs; No lower extremity edema ABDOMEN: abdomen soft, non-tender and normal bowel sounds; surgical incisions noted. No infections. Musculoskeletal:no cyanosis of digits and no clubbing  PSYCH: alert & oriented x 3 with fluent speech NEURO: no focal motor/sensory deficits SKIN:  no rashes or significant lesions  LABORATORY DATA:  I have reviewed the data as listed Lab Results  Component Value Date   WBC 6.9 08/11/2015   HGB 10.6* 08/11/2015   HCT 32.4* 08/11/2015   MCV 82.2  08/11/2015   PLT 158 08/11/2015    Recent Labs  06/08/15 1619  07/27/15 1551 08/09/15 1442 08/10/15 0621 08/11/15 0523  NA 144  --  141  --  133* 137  K 3.9  --  3.7  --  4.1 3.7  CL 108  --  108  --  106 108  CO2 29  --  24  --  23 24  GLUCOSE 84  --  87  --  130* 118*  BUN 9  --  14  --  11 7  CREATININE 0.76  --  0.88 0.79 0.63 0.58  CALCIUM 9.0  --  9.1  --  7.9* 8.1*  GFRNONAA  --   < > >60 >60 >60 >60  GFRAA  --   < > >60 >60 >60 >60  PROT 6.3  --  7.4  --   --   --   ALBUMIN 3.6  --  4.1  --   --   --   AST 11  --  15  --   --   --   ALT 7  --  11*  --   --   --   ALKPHOS 86  --  81  --   --   --   BILITOT 0.2  --  0.3  --   --   --   < > = values in this interval not displayed.   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 Recommend PET scan for further staging. Reviewed the pathology with the patient in detail.  # Recommend FOLFOX based chemotherapy. Discussed the potential side effects of FOLFOX based chemotherapym including but not limited to nausea vomiting diarrhea oral sores tingling and numbness.  # IHC for mismatch repair proteins is in process.; We'll also check for K-ras mutation status.  #  Patient will need a port; also chemotherapy education.   # Patient will follow-up with me after the PET scan; then chemotherapy education.   # 25  minutes face-to-face with the patient discussing the above plan of care; more than 50% of time spent on prognosis/ natural history; counseling and coordination.    Cammie Sickle, MD 08/16/2015 3:13 PM

## 2015-08-16 NOTE — Progress Notes (Signed)
Patient had her surgery on 08/09/15.  Heart rate is elevated at 143 today and she can feel her heart racing but she is nervous.

## 2015-08-17 ENCOUNTER — Telehealth: Payer: Self-pay | Admitting: Surgery

## 2015-08-17 ENCOUNTER — Telehealth: Payer: Self-pay | Admitting: *Deleted

## 2015-08-17 NOTE — Telephone Encounter (Signed)
-----   Message from Cammie Sickle, MD sent at 08/17/2015  8:17 AM EDT ----- Change of plans- cancel the chest CT; instead get PET scan as soon as possible; follow-up with me in 2 days after the PET scan. Thx

## 2015-08-17 NOTE — Telephone Encounter (Signed)
Called patient to inform her that MD has changed CT to PET scan.  Patient will follow up with MD a couple of days after scan.  A scheduler will be calling with appointments/dates/times.  Verbalized understanding.  Nada Boozer, RN

## 2015-08-17 NOTE — Telephone Encounter (Signed)
Looks like Ami made an appointment for this patient but I wanted to let you know that her referral/venous access device orders are under media

## 2015-08-18 ENCOUNTER — Encounter: Payer: Self-pay | Admitting: Family Medicine

## 2015-08-18 ENCOUNTER — Ambulatory Visit (INDEPENDENT_AMBULATORY_CARE_PROVIDER_SITE_OTHER): Payer: 59 | Admitting: Family Medicine

## 2015-08-18 VITALS — BP 132/82 | HR 96 | Temp 98.2°F | Wt 105.0 lb

## 2015-08-18 DIAGNOSIS — Z09 Encounter for follow-up examination after completed treatment for conditions other than malignant neoplasm: Secondary | ICD-10-CM

## 2015-08-18 DIAGNOSIS — R21 Rash and other nonspecific skin eruption: Secondary | ICD-10-CM | POA: Diagnosis not present

## 2015-08-18 DIAGNOSIS — Z5189 Encounter for other specified aftercare: Secondary | ICD-10-CM

## 2015-08-18 DIAGNOSIS — C182 Malignant neoplasm of ascending colon: Secondary | ICD-10-CM

## 2015-08-18 MED ORDER — METHYLPREDNISOLONE ACETATE 80 MG/ML IJ SUSP
80.0000 mg | Freq: Once | INTRAMUSCULAR | Status: AC
  Administered 2015-08-18: 80 mg via INTRAMUSCULAR

## 2015-08-18 NOTE — Progress Notes (Signed)
Pre visit review using our clinic review tool, if applicable. No additional management support is needed unless otherwise documented below in the visit note. 

## 2015-08-18 NOTE — Telephone Encounter (Signed)
Notification has been sent to patient's surgeon at this time in regards to scheduling this surgery. Awaiting a reply at this time.

## 2015-08-18 NOTE — Patient Instructions (Signed)
Start back on the iron (three times daily if you can tolerate it).  Be sure to follow up closely with your specialists.  Benadryl as needed.   I will talk to your oncologist.  Follow up with me in 3-6 months.   I will keep an eye out on your chart to see how things are going.  Call with concerns/issues.  Take care  Dr. Lacinda Axon

## 2015-08-19 ENCOUNTER — Telehealth: Payer: Self-pay

## 2015-08-19 ENCOUNTER — Ambulatory Visit
Admission: RE | Admit: 2015-08-19 | Discharge: 2015-08-19 | Disposition: A | Discharge status: Home / Self Care | Payer: 59 | Source: Ambulatory Visit | Attending: Internal Medicine | Admitting: Internal Medicine

## 2015-08-19 ENCOUNTER — Ambulatory Visit
Admission: RE | Admit: 2015-08-19 | Discharge: 2015-08-19 | Disposition: A | Discharge status: Home / Self Care | Payer: 59 | Source: Ambulatory Visit | Attending: Family Medicine | Admitting: Family Medicine

## 2015-08-19 DIAGNOSIS — N6001 Solitary cyst of right breast: Secondary | ICD-10-CM | POA: Insufficient documentation

## 2015-08-19 DIAGNOSIS — Z9049 Acquired absence of other specified parts of digestive tract: Secondary | ICD-10-CM | POA: Insufficient documentation

## 2015-08-19 DIAGNOSIS — C189 Malignant neoplasm of colon, unspecified: Secondary | ICD-10-CM | POA: Diagnosis not present

## 2015-08-19 DIAGNOSIS — R928 Other abnormal and inconclusive findings on diagnostic imaging of breast: Secondary | ICD-10-CM | POA: Diagnosis not present

## 2015-08-19 DIAGNOSIS — R590 Localized enlarged lymph nodes: Secondary | ICD-10-CM | POA: Insufficient documentation

## 2015-08-19 DIAGNOSIS — C182 Malignant neoplasm of ascending colon: Secondary | ICD-10-CM

## 2015-08-19 DIAGNOSIS — R918 Other nonspecific abnormal finding of lung field: Secondary | ICD-10-CM | POA: Insufficient documentation

## 2015-08-19 DIAGNOSIS — Z09 Encounter for follow-up examination after completed treatment for conditions other than malignant neoplasm: Secondary | ICD-10-CM | POA: Insufficient documentation

## 2015-08-19 DIAGNOSIS — R21 Rash and other nonspecific skin eruption: Secondary | ICD-10-CM | POA: Insufficient documentation

## 2015-08-19 LAB — GLUCOSE, CAPILLARY: Glucose-Capillary: 75 mg/dL (ref 65–99)

## 2015-08-19 MED ORDER — FLUDEOXYGLUCOSE F - 18 (FDG) INJECTION
11.8800 | Freq: Once | INTRAVENOUS | Status: AC | PRN
  Administered 2015-08-19: 11.88 via INTRAVENOUS

## 2015-08-19 NOTE — Assessment & Plan Note (Addendum)
Hospital course reviewed and summarized in HPI. Patient stable at this time and doing well s/p hemicolectomy. Medications revealed. Advised to restart on iron. Has follow up with Surgery and Oncology.

## 2015-08-19 NOTE — Telephone Encounter (Signed)
Patient was in the office today. I have reviewed surgery information with the patient and reminded her of her appointment with Dr Burt Knack on 08/30/15. Pt advised of pre op date/time and sx date. Sx: 09/01/15 with Dr Francesco Runner Placement.  Pre op: 08/26/15 between 9--1:00pm.  Patient made aware to call [phone], between 1-3:00pm the day before surgery, to find out what time to arrive.

## 2015-08-19 NOTE — Telephone Encounter (Signed)
FMLA form was filled out and faxed from [employer].

## 2015-08-19 NOTE — Telephone Encounter (Signed)
Patient will be seen in the office on 08/30/15 and then port will be placed on 09/01/15 by Dr. Burt Knack at [facility].  Please discuss with patient.

## 2015-08-19 NOTE — Progress Notes (Signed)
Subjective:  Patient ID: Nancy Clark, female    DOB: [birth year]  Age: 55 y.o. MRN: [id number]  CC: Hospital follow up  HPI:  55 year old female presents for hospital follow up.  After our last visit, patient got a colonoscopy and was found to have colon cancer (metastatic).  She has been seen by Oncology and Gen surg. She was recent admitted - 3/20 - 3/25. She was admitted for right hemicolectomy due to colon cancer. Hospital course & discharge summary were reviewed and summarized as follows: Patient underwent R hemicolectomy. Unremarkable post operative course. Did well during hospitalization and was having bowel function prior to discharge.  She presents today for follow-up. She states that she is doing as well as can be expected. Pain is minimal. Bowels moving. She has follow-up with surgery next 2 weeks for staple removal. Wound is well-healing. No drainage.  Also, patient has follow-up with oncology will be undergoing PET scan tomorrow. She will be having her port placed soon. Will be undergoing Folfox.  Patient reports that she has recently developed a diffuse rash. It is intensely itchy. No new exposures or medication changes. She has applied hydrocortisone with no improvement.   Social Hx   Social History   Social History  . Marital Status: Married    Spouse Name: N/A  . Number of Children: N/A  . Years of Education: N/A   Social History Main Topics  . Smoking status: Former Smoker -- 0.25 packs/day for 2 years  . Smokeless tobacco: Never Used     Comment: recently quit  . Alcohol Use: No  . Drug Use: No  . Sexual Activity: Yes   Other Topics Concern  . None   Social History Narrative   Review of Systems  Constitutional: Negative.   Skin: Positive for rash.    Objective:  BP 132/82 mmHg  Pulse 96  Temp(Src) 98.2 F (36.8 C) (Oral)  Wt 105 lb (47.628 kg)  SpO2 98%  LMP  (LMP Unknown)  BP/Weight 08/18/2015 08/16/2015 [id number]  Systolic BP [id number]  [id number]  Diastolic BP 82 93 75  Wt. (Lbs) 105 104.06 -  BMI 20.51 20.32 -   Physical Exam  Constitutional: She is oriented to person, place, and time.  Thin female in NAD.  Cardiovascular: Regular rhythm.   Pulmonary/Chest: Effort normal and breath sounds normal.  Abdominal: Soft. She exhibits no distension.  Wound healing well. No drainage.   Neurological: She is alert and oriented to person, place, and time.  Skin:  Diffuse papular rash, ? Urticaria.  Vitals reviewed.  Lab Results  Component Value Date   WBC 6.9 08/11/2015   HGB 10.6* 08/11/2015   HCT 32.4* 08/11/2015   PLT 158 08/11/2015   GLUCOSE 118* 08/11/2015   CHOL 150 06/08/2015   TRIG 110.0 06/08/2015   HDL 48.90 06/08/2015   LDLCALC 79 06/08/2015   ALT 11* 07/27/2015   AST 15 07/27/2015   NA 137 08/11/2015   K 3.7 08/11/2015   CL 108 08/11/2015   CREATININE 0.58 08/11/2015   BUN 7 08/11/2015   CO2 24 08/11/2015   TSH 0.60 06/08/2015    Assessment & Plan:   Problem List Items Addressed This Visit    Hospital discharge follow-up    Hospital course reviewed and summarized in HPI. Patient stable at this time and doing well s/p hemicolectomy. Medications revealed. Advised to restart on iron. Has follow up with Surgery and Oncology.  Rash - Primary    New problem. Unclear etiology. ? Urticaria. Treating with IM Depomedrol.       Relevant Medications   methylPREDNISolone acetate (DEPO-MEDROL) injection 80 mg (Completed)   Other Relevant Orders   Comprehensive metabolic panel      Meds ordered this encounter  Medications  . DISCONTD: ERY-TAB 250 MG EC tablet    Sig:     Refill:  0  . DISCONTD: neomycin (MYCIFRADIN) 500 MG tablet    Sig:     Refill:  0  . DISCONTD: polyethylene glycol powder (GLYCOLAX/MIRALAX) powder    Sig:     Refill:  0  . methylPREDNISolone acetate (DEPO-MEDROL) injection 80 mg    Sig:     Follow-up: 3-6 months; sooner if needed.  [place]

## 2015-08-19 NOTE — Telephone Encounter (Signed)
Patient's [employer] Forms were filled out and faxed.

## 2015-08-19 NOTE — Telephone Encounter (Signed)
FMLA Form for [employer] was filled out and faxed.

## 2015-08-19 NOTE — Assessment & Plan Note (Signed)
New problem. Unclear etiology. ? Urticaria. Treating with IM Depomedrol.

## 2015-08-20 ENCOUNTER — Ambulatory Visit: Payer: Self-pay | Admitting: Family Medicine

## 2015-08-24 ENCOUNTER — Other Ambulatory Visit: Payer: Self-pay | Admitting: *Deleted

## 2015-08-25 ENCOUNTER — Inpatient Hospital Stay: Payer: 59

## 2015-08-25 ENCOUNTER — Inpatient Hospital Stay: Payer: 59 | Admitting: Internal Medicine

## 2015-08-25 ENCOUNTER — Ambulatory Visit: Payer: 59

## 2015-08-26 ENCOUNTER — Other Ambulatory Visit: Payer: Self-pay

## 2015-08-26 ENCOUNTER — Encounter: Payer: Self-pay | Admitting: *Deleted

## 2015-08-26 ENCOUNTER — Inpatient Hospital Stay: Admission: RE | Admit: 2015-08-26 | Payer: Self-pay | Source: Ambulatory Visit

## 2015-08-26 DIAGNOSIS — C189 Malignant neoplasm of colon, unspecified: Secondary | ICD-10-CM

## 2015-08-26 NOTE — Pre-Procedure Instructions (Signed)
CALLED AMY AT DR COOPERS OFFICE AND LET HER KNOW THAT DR COOPER HAS NOT PUT HIS ORDERS IN EPIC-SHE SAID SHE WOULD LET SOMEONE KNOW-SHE SAID THAT SHE IS THE ONLY ONE IN THE OFFICE AND EVERYONE ELSE IS IN [place] OFFICE TODAY- I FAXED THIS TO THE [place] OFFICE AS WELL

## 2015-08-26 NOTE — Patient Instructions (Signed)
  Your procedure is scheduled on: 09-01-15 Report to [facility] To find out your arrival time please call [phone] between 1PM - 3PM on 08-31-15  Remember: Instructions that are not followed completely may result in serious medical risk, up to and including death, or upon the discretion of your surgeon and anesthesiologist your surgery may need to be rescheduled.    _X___ 1. Do not eat food or drink liquids after midnight. No gum chewing or hard candies.     _X___ 2. No Alcohol for 24 hours before or after surgery.   ____ 3. Bring all medications with you on the day of surgery if instructed.    ____ 4. Notify your doctor if there is any change in your medical condition     (cold, fever, infections).     Do not wear jewelry, make-up, hairpins, clips or nail polish.  Do not wear lotions, powders, or perfumes. You may wear deodorant.  Do not shave 48 hours prior to surgery. Men may shave face and neck.  Do not bring valuables to the hospital.    [facility] is not responsible for any belongings or valuables.               Contacts, dentures or bridgework may not be worn into surgery.  Leave your suitcase in the car. After surgery it may be brought to your room.  For patients admitted to the hospital, discharge time is determined by your treatment team.   Patients discharged the day of surgery will not be allowed to drive home.   Please read over the following fact sheets that you were given:      ____ Take these medicines the morning of surgery with A SIP OF WATER:    1. MAY TAKE HYDROCODONE IF NEEDED DAY OF SURGERY WITH A SMALL SIP OF WATER  2.   3.   4.  5.  6.  ____ Fleet Enema (as directed)   ____ Use CHG Soap as directed  ____ Use inhalers on the day of surgery  ____ Stop metformin 2 days prior to surgery    ____ Take 1/2 of usual insulin dose the night before surgery and none on the morning of surgery.   ____ Stop  Coumadin/Plavix/aspirin-N/A  ____ Stop Anti-inflammatories-NO NSAIDS OR ASA PRODUCTS-HYDROCODONE OK TO CONTINUE   ____ Stop supplements until after surgery.    ____ Bring C-Pap to the hospital.

## 2015-08-27 ENCOUNTER — Inpatient Hospital Stay: Payer: 59 | Attending: Internal Medicine

## 2015-08-27 ENCOUNTER — Telehealth: Payer: Self-pay

## 2015-08-27 ENCOUNTER — Inpatient Hospital Stay (HOSPITAL_BASED_OUTPATIENT_CLINIC_OR_DEPARTMENT_OTHER): Payer: 59 | Admitting: Internal Medicine

## 2015-08-27 ENCOUNTER — Other Ambulatory Visit: Payer: Self-pay | Admitting: Internal Medicine

## 2015-08-27 VITALS — BP 125/88 | HR 99 | Temp 97.7°F | Wt 102.7 lb

## 2015-08-27 DIAGNOSIS — Z9049 Acquired absence of other specified parts of digestive tract: Secondary | ICD-10-CM

## 2015-08-27 DIAGNOSIS — C778 Secondary and unspecified malignant neoplasm of lymph nodes of multiple regions: Secondary | ICD-10-CM | POA: Diagnosis not present

## 2015-08-27 DIAGNOSIS — Z800 Family history of malignant neoplasm of digestive organs: Secondary | ICD-10-CM | POA: Insufficient documentation

## 2015-08-27 DIAGNOSIS — Z5111 Encounter for antineoplastic chemotherapy: Secondary | ICD-10-CM | POA: Diagnosis not present

## 2015-08-27 DIAGNOSIS — R109 Unspecified abdominal pain: Secondary | ICD-10-CM

## 2015-08-27 DIAGNOSIS — C182 Malignant neoplasm of ascending colon: Secondary | ICD-10-CM | POA: Insufficient documentation

## 2015-08-27 DIAGNOSIS — Z87891 Personal history of nicotine dependence: Secondary | ICD-10-CM | POA: Diagnosis not present

## 2015-08-27 DIAGNOSIS — F419 Anxiety disorder, unspecified: Secondary | ICD-10-CM | POA: Insufficient documentation

## 2015-08-27 DIAGNOSIS — Z79899 Other long term (current) drug therapy: Secondary | ICD-10-CM | POA: Insufficient documentation

## 2015-08-27 DIAGNOSIS — R630 Anorexia: Secondary | ICD-10-CM | POA: Insufficient documentation

## 2015-08-27 DIAGNOSIS — G4709 Other insomnia: Secondary | ICD-10-CM | POA: Diagnosis not present

## 2015-08-27 DIAGNOSIS — R030 Elevated blood-pressure reading, without diagnosis of hypertension: Secondary | ICD-10-CM | POA: Insufficient documentation

## 2015-08-27 DIAGNOSIS — R5383 Other fatigue: Secondary | ICD-10-CM | POA: Insufficient documentation

## 2015-08-27 DIAGNOSIS — Z8042 Family history of malignant neoplasm of prostate: Secondary | ICD-10-CM

## 2015-08-27 DIAGNOSIS — Z9889 Other specified postprocedural states: Secondary | ICD-10-CM | POA: Diagnosis not present

## 2015-08-27 LAB — COMPREHENSIVE METABOLIC PANEL
ALBUMIN: 3.7 g/dL (ref 3.5–5.0)
ALT: 14 U/L (ref 14–54)
AST: 13 U/L — AB (ref 15–41)
Alkaline Phosphatase: 71 U/L (ref 38–126)
Anion gap: 5 (ref 5–15)
BUN: 9 mg/dL (ref 6–20)
CHLORIDE: 106 mmol/L (ref 101–111)
CO2: 26 mmol/L (ref 22–32)
Calcium: 9 mg/dL (ref 8.9–10.3)
Creatinine, Ser: 0.64 mg/dL (ref 0.44–1.00)
GFR calc Af Amer: >60 mL/min (ref >60)
GFR calc non Af Amer: >60 mL/min (ref >60)
GLUCOSE: 102 mg/dL — AB (ref 65–99)
POTASSIUM: 3.7 mmol/L (ref 3.5–5.1)
Sodium: 137 mmol/L (ref 135–145)
Total Bilirubin: 0.3 mg/dL (ref 0.3–1.2)
Total Protein: 6.7 g/dL (ref 6.5–8.1)

## 2015-08-27 LAB — CBC WITH DIFFERENTIAL/PLATELET
BASOS ABS: 0.1 10*3/uL (ref 0–0.1)
BASOS PCT: 1 %
EOS PCT: 4 %
Eosinophils Absolute: 0.3 10*3/uL (ref 0–0.7)
HCT: 38.5 % (ref 35.0–47.0)
Hemoglobin: 12.7 g/dL (ref 12.0–16.0)
Lymphocytes Relative: 21 %
Lymphs Abs: 1.6 10*3/uL (ref 1.0–3.6)
MCH: 27.1 pg (ref 26.0–34.0)
MCHC: 32.9 g/dL (ref 32.0–36.0)
MCV: 82.2 fL (ref 80.0–100.0)
MONO ABS: 0.7 10*3/uL (ref 0.2–0.9)
Monocytes Relative: 9 %
NEUTROS ABS: 5.2 10*3/uL (ref 1.4–6.5)
Neutrophils Relative %: 65 %
PLATELETS: 254 10*3/uL (ref 150–440)
RBC: 4.69 MIL/uL (ref 3.80–5.20)
RDW: 17.5 % — AB (ref 11.5–14.5)
WBC: 7.8 10*3/uL (ref 3.6–11.0)

## 2015-08-27 MED ORDER — LIDOCAINE-PRILOCAINE 2.5-2.5 % EX CREA: 1.0000 "application " | TOPICAL_CREAM | CUTANEOUS | Status: DC | PRN

## 2015-08-27 MED ORDER — PROCHLORPERAZINE MALEATE 10 MG PO TABS: 10.0000 mg | ORAL_TABLET | Freq: Four times a day (QID) | ORAL | Status: DC | PRN

## 2015-08-27 MED ORDER — ONDANSETRON HCL 8 MG PO TABS: 8.0000 mg | ORAL_TABLET | Freq: Three times a day (TID) | ORAL | Status: DC | PRN

## 2015-08-27 NOTE — Telephone Encounter (Signed)
Filled out and faxed ([employer]) patient's husband's disability form.

## 2015-08-27 NOTE — Progress Notes (Signed)
[facility] NOTE  Patient Care Team: Coral Spikes, DO as PCP - General (Family Medicine)  CHIEF COMPLAINTS/PURPOSE OF CONSULTATION:   # [facility] 2017- COLON CANCER- STAGE IV [s/p right hemi-colectomy; pT4apN2 (7/19LN)]; Pre-op CEA- [id number]  PET-  mesenteric adenopathy/mediastinal adenopathy/right-sided subclavicular lymph node. April 17th -START FOLFOX; awaiting K-ras  # [facility]- STABLE.   HISTORY OF PRESENTING ILLNESS:  Nancy Clark 55 y.o.  female  with a newly diagnosed right-sided colon cancer status post right hemicolectomy is here to review the results of the PET  scan/next plan of care.  Continues to have poor appetite. Otherwise no nausea no vomiting.  She admits to intermittent abdominal pain. Otherwise no diarrhea or constipation. Mild fatigue.  ROS: A complete 10 point review of system is done which is negative except mentioned above in history of present illness  MEDICAL HISTORY:  Past Medical History  Diagnosis Date  . Chicken pox   . Cancer [facility])     recently cancer of right colon cancer    SURGICAL HISTORY: Past Surgical History  Procedure Laterality Date  . Shoulder surgery Left   . Ectopic pregnancy surgery    . Colonoscopy    . Breast biopsy Right     2007? pt unsure done by [facility]  . 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  1 pack in 3 days; no alcohol. 2 childern [25 & 17 years] Social History   Social History  . Marital Status: Married    Spouse Name: N/A  . Number of Children: N/A  . Years of Education: N/A   Occupational History  . Not on file.   Social History Main Topics  . Smoking status: Former Smoker -- 0.25 packs/day for 2 years  . Smokeless tobacco: Never Used     Comment: recently quit  . Alcohol Use: No  . Drug Use: No  .  Sexual Activity: Yes   Other Topics Concern  . Not on file   Social History Narrative    FAMILY HISTORY: 1 brother& 1 sister- no cancer. Mom-no cancer Family History  Problem Relation Age of Onset  . Hypertension Mother   . Arthritis Father   . Prostate cancer Father   . Diabetes Maternal Grandmother   . Cancer Maternal Grandfather     colon    ALLERGIES:  has No Known Allergies.  MEDICATIONS:  Current Outpatient Prescriptions  Medication Sig Dispense Refill  . HYDROcodone-acetaminophen (NORCO/VICODIN) 5-325 MG tablet Take 1-2 tablets by mouth every 4 (four) hours as needed for moderate pain or severe pain. 30 tablet 0   No current facility-administered medications for this visit.      Marland Kitchen  PHYSICAL EXAMINATION: ECOG PERFORMANCE STATUS: 0 - Asymptomatic  Filed Vitals:   08/27/15 1150  BP: 125/88  Pulse: 99  Temp: 97.7 F (36.5 C)   Filed Weights   08/27/15 1150  Weight: 102 lb 11.8 oz (46.6 kg)    GENERAL: Well-nourished well-developed; thin built; Alert, no distress and comfortable.   Accompanied by her mother.  EYES: no pallor or icterus OROPHARYNX: no thrush or ulceration; good dentition  NECK: supple, no masses felt LYMPH:  no palpable lymphadenopathy in the cervical, axillary or inguinal regions LUNGS: clear to auscultation and  No wheeze or crackles HEART/CVS: regular rate & rhythm and no  murmurs; No lower extremity edema ABDOMEN: abdomen soft, non-tender and normal bowel sounds; surgical incisions noted. No infections. Musculoskeletal:no cyanosis of digits and no clubbing  PSYCH: alert & oriented x 3 with fluent speech NEURO: no focal motor/sensory deficits SKIN:  no rashes or significant lesions  LABORATORY DATA:  I have reviewed the data as listed Lab Results  Component Value Date   WBC 7.8 08/27/2015   HGB 12.7 08/27/2015   HCT 38.5 08/27/2015   MCV 82.2 08/27/2015   PLT 254 08/27/2015    Recent Labs  06/08/15 1619 07/27/15 1551   08/10/15 0621 08/11/15 0523 08/27/15 1123  NA 144 141  --  133* 137 137  K 3.9 3.7  --  4.1 3.7 3.7  CL 108 108  --  106 108 106  CO2 29 24  --  23 24 26   GLUCOSE 84 87  --  130* 118* 102*  BUN 9 14  --  11 7 9   CREATININE 0.76 0.88  < > 0.63 0.58 0.64  CALCIUM 9.0 9.1  --  7.9* 8.1* 9.0  GFRNONAA  --  >60  < > >60 >60 >60  GFRAA  --  >60  < > >60 >60 >60  PROT 6.3 7.4  --   --   --  6.7  ALBUMIN 3.6 4.1  --   --   --  3.7  AST 11 15  --   --   --  13*  ALT 7 11*  --   --   --  14  ALKPHOS 86 81  --   --   --  71  BILITOT 0.2 0.3  --   --   --  0.3  < > = values in this interval not displayed.   ASSESSMENT & PLAN:   # Colon cancer- cecal/ right-sided; STAGE IV; Patient has metastatic retroperitoneal adenopathy/mesenteric adenopathy [on discussion with Dr. Burt Knack; and also based on the PET scan- mesenteric adenopathy/mediastinal adenopathy/right-sided subclavicular lymph node. I would not recommend biopsy of any distant metastatic lesion- as patient was noted to have residual metastatic adenopathy in the abdomen at surgery.  # I reviewed the staging pathology with the patient in detail. Also reviewed the images with her/mother. Given stage IV disease the treatments are palliative/ and usually indefinite. Patient was likely get a scan after 2 or 3 months of chemotherapy.  #  FOLFOX based chemotherapy. Discussed the potential side effects of FOLFOX based chemotherapym including but not limited to nausea vomiting diarrhea oral sores tingling and numbness. Awaiting K-ras mutation status. If K-ras mutated patient with a candidate for Avastin.  #  Patient will need a port; also chemotherapy education.   # I reviewed the images myself; and with the patient/mother in detail. Also discussed with Dr. Burt Knack.  # patient will likely start chemotherapy on April 17/follow-up with me on that day.   # 40 minutes face-to-face with the patient discussing the above plan of care; more than 50% of  time spent on prognosis/ natural history; counseling and coordination.     Cammie Sickle, MD 08/27/2015 5:06 PM

## 2015-08-27 NOTE — Progress Notes (Signed)
RN spent approximately 25 minutes with direct patient care. Active listening provided to patient. 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 Her current job involves pushing, pulling, moping, vacuuming, cleaning and picking up heavy objects. This is not advisable while her chemo pump is infusing. She states that Dr. Burt Knack will release her to go back to work (from a surgical perspective) on the Monday after Easter (on 4/17). Pt request an extension for the FMLA for chemotherapy purposes. I reassured patient and told her that this is a very reasonable request. She states that she will contact human resources, her supervisor and the [employer] company for [employer]. Our fax number was provided so that that paperwork for FMLA extension can be completed by Dr. Rogue Bussing. She was also provided with clinical information on FOLFOX and the purpose of the 5FU pump and frequency of treatment. She will have her port a cath placed on Wednesday next week by Dr. Burt Knack.

## 2015-08-30 ENCOUNTER — Encounter: Payer: Self-pay | Admitting: Surgery

## 2015-08-30 ENCOUNTER — Ambulatory Visit (INDEPENDENT_AMBULATORY_CARE_PROVIDER_SITE_OTHER): Payer: 59 | Admitting: Surgery

## 2015-08-30 ENCOUNTER — Encounter (INDEPENDENT_AMBULATORY_CARE_PROVIDER_SITE_OTHER): Payer: Self-pay

## 2015-08-30 VITALS — BP 155/87 | HR 93 | Temp 97.5°F | Ht 60.0 in | Wt 104.0 lb

## 2015-08-30 DIAGNOSIS — C182 Malignant neoplasm of ascending colon: Secondary | ICD-10-CM

## 2015-08-30 LAB — SURGICAL PATHOLOGY

## 2015-08-30 NOTE — Progress Notes (Signed)
Outpatient postop visit  08/30/2015  Nancy Clark is an 55 y.o. female.    Procedure: Right hemicolectomy  CC: Poor appetite  HPI: Patient describes a poor appetite but only minimal pain she is passing gas and having alternating diarrhea and hard formed stools. Had no nausea or vomiting. She is here for postoperative check and also for discussion concerning port placement. She is scheduled for Wednesday of this week and I've discussed with [facility] the plans for chemotherapy due to metastatic disease  Medications reviewed.    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 I also discussed with oncology the findings of her PET scan suggestive of mediastinal and supraclavicular disease A port will be placed on Wednesday. I discussed with she and her family the rationale for placing the port and the options of alternative IV access. I also discussed risk bleeding infection pneumothorax hemopneumothorax needing chest tube placement etc. this was all reviewed her she understood and agreed to proceed  Florene Glen, MD, FACS

## 2015-08-30 NOTE — Patient Instructions (Signed)
Please call our office with any questions or concerns.  Please do not submerge in a tub, hot tub, or pool until incisions are completely sealed.  Use sun block to incision area over the next year if this area will be exposed to sun. This helps decrease scarring.  You may now resume your normal activities. Listen to your body when lifting, if you have pain when lifting, stop and then try again in a few days. No heavy lifting for 4 more weeks.  If you develop redness, drainage, or pain at incision sites- call our office immediately and speak with a nurse.

## 2015-08-31 ENCOUNTER — Encounter: Payer: Self-pay | Admitting: Pathology

## 2015-08-31 ENCOUNTER — Inpatient Hospital Stay: Payer: 59

## 2015-08-31 ENCOUNTER — Encounter (INDEPENDENT_AMBULATORY_CARE_PROVIDER_SITE_OTHER): Payer: Self-pay

## 2015-08-31 NOTE — Patient Instructions (Signed)

## 2015-09-01 ENCOUNTER — Ambulatory Visit: Payer: 59

## 2015-09-01 ENCOUNTER — Encounter
Admission: RE | Disposition: A | Discharge status: Home / Self Care | Payer: Self-pay | Source: Ambulatory Visit | Attending: Surgery

## 2015-09-01 ENCOUNTER — Ambulatory Visit
Admission: RE | Admit: 2015-09-01 | Discharge: 2015-09-01 | Disposition: A | Discharge status: Home / Self Care | Payer: 59 | Source: Ambulatory Visit | Attending: Surgery | Admitting: Surgery

## 2015-09-01 ENCOUNTER — Ambulatory Visit: Payer: 59 | Admitting: Certified Registered Nurse Anesthetist

## 2015-09-01 DIAGNOSIS — Z833 Family history of diabetes mellitus: Secondary | ICD-10-CM | POA: Diagnosis not present

## 2015-09-01 DIAGNOSIS — Z8042 Family history of malignant neoplasm of prostate: Secondary | ICD-10-CM | POA: Insufficient documentation

## 2015-09-01 DIAGNOSIS — C189 Malignant neoplasm of colon, unspecified: Secondary | ICD-10-CM | POA: Diagnosis not present

## 2015-09-01 DIAGNOSIS — Z8261 Family history of arthritis: Secondary | ICD-10-CM | POA: Diagnosis not present

## 2015-09-01 DIAGNOSIS — Z79899 Other long term (current) drug therapy: Secondary | ICD-10-CM | POA: Insufficient documentation

## 2015-09-01 DIAGNOSIS — Z452 Encounter for adjustment and management of vascular access device: Secondary | ICD-10-CM | POA: Diagnosis not present

## 2015-09-01 DIAGNOSIS — Z87891 Personal history of nicotine dependence: Secondary | ICD-10-CM | POA: Diagnosis not present

## 2015-09-01 DIAGNOSIS — Z9049 Acquired absence of other specified parts of digestive tract: Secondary | ICD-10-CM | POA: Insufficient documentation

## 2015-09-01 DIAGNOSIS — Z8249 Family history of ischemic heart disease and other diseases of the circulatory system: Secondary | ICD-10-CM | POA: Diagnosis not present

## 2015-09-01 DIAGNOSIS — C182 Malignant neoplasm of ascending colon: Secondary | ICD-10-CM | POA: Diagnosis not present

## 2015-09-01 DIAGNOSIS — Z800 Family history of malignant neoplasm of digestive organs: Secondary | ICD-10-CM | POA: Insufficient documentation

## 2015-09-01 HISTORY — PX: PORTACATH PLACEMENT: SHX2246

## 2015-09-01 SURGERY — INSERTION, TUNNELED CENTRAL VENOUS DEVICE, WITH PORT
Anesthesia: General | Wound class: Clean

## 2015-09-01 MED ORDER — DEXAMETHASONE SODIUM PHOSPHATE 10 MG/ML IJ SOLN
INTRAMUSCULAR | Status: DC | PRN
  Administered 2015-09-01: 5 mg via INTRAVENOUS

## 2015-09-01 MED ORDER — FAMOTIDINE 20 MG PO TABS
ORAL_TABLET | ORAL | Status: AC
  Administered 2015-09-01: 20 mg via ORAL
  Filled 2015-09-01: qty 1

## 2015-09-01 MED ORDER — GLYCOPYRROLATE 0.2 MG/ML IJ SOLN
INTRAMUSCULAR | Status: DC | PRN
  Administered 2015-09-01: .1 mg via INTRAVENOUS

## 2015-09-01 MED ORDER — CEFAZOLIN SODIUM-DEXTROSE 2-4 GM/100ML-% IV SOLN
INTRAVENOUS | Status: AC
  Administered 2015-09-01: 2 g via INTRAVENOUS
  Filled 2015-09-01: qty 100

## 2015-09-01 MED ORDER — FENTANYL CITRATE (PF) 100 MCG/2ML IJ SOLN: 25.0000 ug | INTRAMUSCULAR | Status: DC | PRN

## 2015-09-01 MED ORDER — HEPARIN SODIUM (PORCINE) 5000 UNIT/ML IJ SOLN
5000.0000 [IU] | Freq: Once | INTRAMUSCULAR | Status: AC
  Administered 2015-09-01: 5000 [IU] via SUBCUTANEOUS

## 2015-09-01 MED ORDER — BUPIVACAINE HCL (PF) 0.25 % IJ SOLN
INTRAMUSCULAR | Status: AC
  Filled 2015-09-01: qty 30

## 2015-09-01 MED ORDER — BUPIVACAINE-EPINEPHRINE (PF) 0.25% -1:200000 IJ SOLN
INTRAMUSCULAR | Status: DC | PRN
  Administered 2015-09-01: 10 mL via PERINEURAL

## 2015-09-01 MED ORDER — CEFAZOLIN SODIUM-DEXTROSE 2-4 GM/100ML-% IV SOLN
2.0000 g | INTRAVENOUS | Status: AC
  Administered 2015-09-01: 2 g via INTRAVENOUS

## 2015-09-01 MED ORDER — HEPARIN SODIUM (PORCINE) 5000 UNIT/ML IJ SOLN
INTRAMUSCULAR | Status: AC
  Filled 2015-09-01: qty 1

## 2015-09-01 MED ORDER — ONDANSETRON HCL 4 MG/2ML IJ SOLN: 4.0000 mg | Freq: Once | INTRAMUSCULAR | Status: DC | PRN

## 2015-09-01 MED ORDER — FENTANYL CITRATE (PF) 100 MCG/2ML IJ SOLN
INTRAMUSCULAR | Status: DC | PRN
  Administered 2015-09-01 (×2): 50 ug via INTRAVENOUS

## 2015-09-01 MED ORDER — SODIUM CHLORIDE 0.9 % IV SOLN
INTRAVENOUS | Status: DC | PRN
  Administered 2015-09-01: 4 mL via INTRAMUSCULAR

## 2015-09-01 MED ORDER — HYDROCODONE-ACETAMINOPHEN 5-325 MG PO TABS: 1.0000 | ORAL_TABLET | Freq: Four times a day (QID) | ORAL | Status: DC | PRN

## 2015-09-01 MED ORDER — ONDANSETRON HCL 4 MG/2ML IJ SOLN
INTRAMUSCULAR | Status: DC | PRN
  Administered 2015-09-01: 4 mg via INTRAVENOUS

## 2015-09-01 MED ORDER — LACTATED RINGERS IV SOLN
INTRAVENOUS | Status: DC
  Administered 2015-09-01: 13:00:00 via INTRAVENOUS

## 2015-09-01 MED ORDER — MIDAZOLAM HCL 5 MG/5ML IJ SOLN
INTRAMUSCULAR | Status: DC | PRN
  Administered 2015-09-01: 2 mg via INTRAVENOUS

## 2015-09-01 MED ORDER — FAMOTIDINE 20 MG PO TABS
20.0000 mg | ORAL_TABLET | Freq: Once | ORAL | Status: AC
  Administered 2015-09-01: 20 mg via ORAL

## 2015-09-01 MED ORDER — PROPOFOL 500 MG/50ML IV EMUL
INTRAVENOUS | Status: DC | PRN
  Administered 2015-09-01: 125 ug/kg/min via INTRAVENOUS

## 2015-09-01 MED ORDER — HEPARIN SODIUM (PORCINE) 5000 UNIT/ML IJ SOLN
INTRAMUSCULAR | Status: AC
  Administered 2015-09-01: 5000 [IU] via SUBCUTANEOUS
  Filled 2015-09-01: qty 1

## 2015-09-01 MED ORDER — PROPOFOL 10 MG/ML IV BOLUS
INTRAVENOUS | Status: DC | PRN
  Administered 2015-09-01: 40 mg via INTRAVENOUS

## 2015-09-01 SURGICAL SUPPLY — 26 items
ADHESIVE MASTISOL STRL (MISCELLANEOUS) ×3 IMPLANT
CANISTER SUCT 1200ML W/VALVE (MISCELLANEOUS) ×3 IMPLANT
CHLORAPREP W/TINT 26ML (MISCELLANEOUS) ×3 IMPLANT
CLOSURE WOUND 1/2 X4 (GAUZE/BANDAGES/DRESSINGS) ×1
COVER LIGHT HANDLE STERIS (MISCELLANEOUS) ×6 IMPLANT
DRAPE C-ARM XRAY 36X54 (DRAPES) ×3 IMPLANT
ELECT REM PT RETURN 9FT ADLT (ELECTROSURGICAL) ×3
ELECTRODE REM PT RTRN 9FT ADLT (ELECTROSURGICAL) ×1 IMPLANT
GAUZE SPONGE 4X4 12PLY STRL (GAUZE/BANDAGES/DRESSINGS) ×3 IMPLANT
GAUZE SPONGE NON-WVN 2X2 STRL (MISCELLANEOUS) ×2 IMPLANT
GLOVE BIO SURGEON STRL SZ8 (GLOVE) ×9 IMPLANT
GOWN STRL REUS W/ TWL LRG LVL3 (GOWN DISPOSABLE) ×2 IMPLANT
GOWN STRL REUS W/TWL LRG LVL3 (GOWN DISPOSABLE) ×4
KIT PORT POWER 8FR ISP CVUE (Catheter) ×3 IMPLANT
KIT RM TURNOVER STRD PROC AR (KITS) ×3 IMPLANT
LABEL OR SOLS (LABEL) ×3 IMPLANT
NEEDLE FILTER BLUNT 18X 1/2SAF (NEEDLE) ×2
NEEDLE FILTER BLUNT 18X1 1/2 (NEEDLE) ×1 IMPLANT
PACK PORT-A-CATH (MISCELLANEOUS) ×3 IMPLANT
SPONGE VERSALON 2X2 STRL (MISCELLANEOUS) ×4
STRIP CLOSURE SKIN 1/2X4 (GAUZE/BANDAGES/DRESSINGS) ×2 IMPLANT
SUT MNCRL AB 4-0 PS2 18 (SUTURE) ×3 IMPLANT
SUT PROLENE 3 0 SH DA (SUTURE) ×3 IMPLANT
SUT VIC AB 3-0 SH 27 (SUTURE) ×2
SUT VIC AB 3-0 SH 27X BRD (SUTURE) ×1 IMPLANT
SYR 3ML LL SCALE MARK (SYRINGE) IMPLANT

## 2015-09-01 NOTE — Transfer of Care (Signed)
Immediate Anesthesia Transfer of Care Note  Patient: Nancy Clark  Procedure(s) Performed: Procedure(s): INSERTION PORT-A-CATH (N/A)  Patient Location: PACU  Anesthesia Type:MAC  Level of Consciousness: awake, alert  and oriented  Airway & Oxygen Therapy: Patient Spontanous Breathing and Patient connected to face mask oxygen  Post-op Assessment: Report given to RN, Post -op Vital signs reviewed and stable and Patient moving all extremities X 4  Post vital signs: Reviewed and stable  Last Vitals:  Filed Vitals:   09/01/15 1307 09/01/15 1451  BP: 136/83   Pulse: 100   Temp: 36.7 C 35.6 C  Resp: 16     Complications: No apparent anesthesia complications

## 2015-09-01 NOTE — Anesthesia Postprocedure Evaluation (Signed)
Anesthesia Post Note  Patient: Nancy Clark  Procedure(s) Performed: Procedure(s) (LRB): INSERTION PORT-A-CATH (N/A)  Patient location during evaluation: PACU Anesthesia Type: General Level of consciousness: awake and alert Pain management: pain level controlled Vital Signs Assessment: post-procedure vital signs reviewed and stable Respiratory status: spontaneous breathing and respiratory function stable Cardiovascular status: stable Anesthetic complications: no    Last Vitals:  Filed Vitals:   09/01/15 1452 09/01/15 1507  BP: 112/80 136/86  Pulse: 80 92  Temp: 36.2 C   Resp: 17 14    Last Pain: There were no vitals filed for this visit.               Sevin Farone K

## 2015-09-01 NOTE — Discharge Instructions (Signed)
Remove dressing in 24 hours. May shower in 24 hours. Leave paper strips in place. Resume all home medications. Follow-up with Dr. Burt Knack in 10 days.  Do not use topical cream on surgical site until seen in office. May start chemotherapy at any time.AMBULATORY SURGERY  DISCHARGE INSTRUCTIONS   1) The drugs that you were given will stay in your system until tomorrow so for the next 24 hours you should not:  A) Drive an automobile B) Make any legal decisions C) Drink any alcoholic beverage   2) You may resume regular meals tomorrow.  Today it is better to start with liquids and gradually work up to solid foods.  You may eat anything you prefer, but it is better to start with liquids, then soup and crackers, and gradually work up to solid foods.   3) Please notify your doctor immediately if you have any unusual bleeding, trouble breathing, redness and pain at the surgery site, drainage, fever, or pain not relieved by medication.    4) Additional Instructions:        Please contact your physician with any problems or Same Day Surgery at [phone], Monday through Friday 6 am to 4 pm, or [place] at [facility] number at [phone].

## 2015-09-01 NOTE — Progress Notes (Signed)
Preoperative Review   Patient is met in the preoperative holding area. The history is reviewed in the chart and with the patient. I personally reviewed the options and rationale as well as the risks of this procedure that have been previously discussed with the patient. All questions asked by the patient and/or family were answered to their satisfaction.  Patient agrees to proceed with this procedure at this time.  Florene Glen M.D. FACS

## 2015-09-01 NOTE — Op Note (Signed)
09/01/2015  2:43 PM  PATIENT:  Nancy Clark  55 y.o. female  PRE-OPERATIVE DIAGNOSIS:  Colon cancer  POST-OPERATIVE DIAGNOSIS:  Same  PROCEDURE: port placement with fluoroscopy  SURGEON:  Florene Glen MD, FACS   ANESTHESIA:  MAC/local   Details of Procedure: Patient with colon cancer here for port placement preoperatively discussed rationale for surgery the options of observation risk bleeding infection thrombosis nonfunction breakage pneumothorax hemopneumothorax any of which could require further surgery chest tube placement she understood and agreed to proceed this is all reviewed for her in the preop holding area.  Patient was induced and monitored anesthesia care prepped draped sterile fashion and a surgical pause was held.  Local anesthetic was infiltrated in skin and subcutaneous tissues tissues on the left anterior chest wall then on the first attempt large bore needle was placed into the left subclavian vein and a Seldinger wire was placed.  The uroscopy confirmed that the Seldinger wire was indeed in the superior vena cava. An incision was made and a port pocket was developed with blunt and electrocautery dissection hemostasis was with electrocautery.  Over the Seldinger wire was placed the previously flushed introducer dilator and wire was removed and then the catheter was placed which had been previously flushed as well. Fluoroscopy demonstrated that the tip was in the superior vena cava it was trimmed appropriately and attached to the previously flushed port. The port was then placed into the pocket and aspirated and flushed for function. Or was sutured to the subcutaneous tissues with 3-0 Prolene suture and then the wound was closed with deep sutures of 3-0 Vicryl followed by 4-0 subcuticular Monocryl. Steri-Strips Mastisol and sterile dressings were placed  Patient out of the procedure well workup occasions she was taken to recovery room in stable condition to be  discharged care of her family following a chest x-ray which was ordered.     Florene Glen, MD FACS

## 2015-09-01 NOTE — Anesthesia Preprocedure Evaluation (Signed)
Anesthesia Evaluation  Patient identified by MRN, date of birth, ID band Patient awake    Reviewed: Allergy & Precautions, NPO status , Patient's Chart, lab work & pertinent test results  History of Anesthesia Complications Negative for: history of anesthetic complications  Airway Mallampati: II       Dental   Pulmonary neg pulmonary ROS, former smoker,           Cardiovascular negative cardio ROS       Neuro/Psych negative neurological ROS     GI/Hepatic negative GI ROS, Neg liver ROS,   Endo/Other  negative endocrine ROS  Renal/GU negative Renal ROS     Musculoskeletal   Abdominal   Peds  Hematology negative hematology ROS (+)   Anesthesia Other Findings   Reproductive/Obstetrics                             Anesthesia Physical Anesthesia Plan  ASA: III  Anesthesia Plan: General   Post-op Pain Management:    Induction: Intravenous  Airway Management Planned: Nasal Cannula  Additional Equipment:   Intra-op Plan:   Post-operative Plan:   Informed Consent: I have reviewed the patients History and Physical, chart, labs and discussed the procedure including the risks, benefits and alternatives for the proposed anesthesia with the patient or authorized representative who has indicated his/her understanding and acceptance.     Plan Discussed with:   Anesthesia Plan Comments:         Anesthesia Quick Evaluation

## 2015-09-01 NOTE — Anesthesia Procedure Notes (Signed)
Procedure Name: MAC Date/Time: 09/01/2015 2:17 PM Performed by: Kennon Holter Pre-anesthesia Checklist: Patient being monitored, Patient identified, Emergency Drugs available, Suction available and Timeout performed Oxygen Delivery Method: Simple face mask Preoxygenation: Pre-oxygenation with 100% oxygen Intubation Type: IV induction Placement Confirmation: positive ETCO2 and breath sounds checked- equal and bilateral

## 2015-09-06 ENCOUNTER — Ambulatory Visit: Payer: Self-pay | Admitting: Internal Medicine

## 2015-09-06 ENCOUNTER — Inpatient Hospital Stay: Payer: 59

## 2015-09-06 ENCOUNTER — Encounter: Payer: Self-pay | Admitting: Internal Medicine

## 2015-09-06 ENCOUNTER — Inpatient Hospital Stay (HOSPITAL_BASED_OUTPATIENT_CLINIC_OR_DEPARTMENT_OTHER): Payer: 59 | Admitting: Internal Medicine

## 2015-09-06 ENCOUNTER — Other Ambulatory Visit: Payer: Self-pay

## 2015-09-06 VITALS — BP 161/95 | HR 89 | Temp 95.7°F | Resp 18 | Wt 102.1 lb

## 2015-09-06 DIAGNOSIS — C778 Secondary and unspecified malignant neoplasm of lymph nodes of multiple regions: Secondary | ICD-10-CM

## 2015-09-06 DIAGNOSIS — G4709 Other insomnia: Secondary | ICD-10-CM | POA: Diagnosis not present

## 2015-09-06 DIAGNOSIS — R109 Unspecified abdominal pain: Secondary | ICD-10-CM

## 2015-09-06 DIAGNOSIS — R630 Anorexia: Secondary | ICD-10-CM | POA: Diagnosis not present

## 2015-09-06 DIAGNOSIS — Z87891 Personal history of nicotine dependence: Secondary | ICD-10-CM

## 2015-09-06 DIAGNOSIS — F419 Anxiety disorder, unspecified: Secondary | ICD-10-CM

## 2015-09-06 DIAGNOSIS — Z79899 Other long term (current) drug therapy: Secondary | ICD-10-CM

## 2015-09-06 DIAGNOSIS — R5383 Other fatigue: Secondary | ICD-10-CM

## 2015-09-06 DIAGNOSIS — Z9889 Other specified postprocedural states: Secondary | ICD-10-CM | POA: Diagnosis not present

## 2015-09-06 DIAGNOSIS — Z9049 Acquired absence of other specified parts of digestive tract: Secondary | ICD-10-CM

## 2015-09-06 DIAGNOSIS — C182 Malignant neoplasm of ascending colon: Secondary | ICD-10-CM

## 2015-09-06 DIAGNOSIS — R030 Elevated blood-pressure reading, without diagnosis of hypertension: Secondary | ICD-10-CM

## 2015-09-06 DIAGNOSIS — Z5111 Encounter for antineoplastic chemotherapy: Secondary | ICD-10-CM | POA: Diagnosis not present

## 2015-09-06 DIAGNOSIS — C772 Secondary and unspecified malignant neoplasm of intra-abdominal lymph nodes: Principal | ICD-10-CM

## 2015-09-06 MED ORDER — OXALIPLATIN CHEMO INJECTION 100 MG/20ML
85.0000 mg/m2 | Freq: Once | INTRAVENOUS | Status: AC
  Administered 2015-09-06: 120 mg via INTRAVENOUS
  Filled 2015-09-06: qty 20

## 2015-09-06 MED ORDER — SODIUM CHLORIDE 0.9 % IV SOLN
10.0000 mg | Freq: Once | INTRAVENOUS | Status: AC
  Administered 2015-09-06: 10 mg via INTRAVENOUS
  Filled 2015-09-06: qty 1

## 2015-09-06 MED ORDER — PALONOSETRON HCL INJECTION 0.25 MG/5ML
0.2500 mg | Freq: Once | INTRAVENOUS | Status: AC
  Administered 2015-09-06: 0.25 mg via INTRAVENOUS
  Filled 2015-09-06: qty 5

## 2015-09-06 MED ORDER — LEUCOVORIN CALCIUM INJECTION 350 MG
550.0000 mg | Freq: Once | INTRAVENOUS | Status: AC
  Administered 2015-09-06: 550 mg via INTRAVENOUS
  Filled 2015-09-06: qty 27.5

## 2015-09-06 MED ORDER — DEXTROSE 5 % IV SOLN
Freq: Once | INTRAVENOUS | Status: AC
  Administered 2015-09-06: 10:00:00 via INTRAVENOUS
  Filled 2015-09-06: qty 1000

## 2015-09-06 MED ORDER — FLUOROURACIL CHEMO INJECTION 2.5 GM/50ML
400.0000 mg/m2 | Freq: Once | INTRAVENOUS | Status: AC
  Administered 2015-09-06: 550 mg via INTRAVENOUS
  Filled 2015-09-06: qty 11

## 2015-09-06 MED ORDER — FLUOROURACIL CHEMO INJECTION 5 GM/100ML
2400.0000 mg/m2 | INTRAVENOUS | Status: DC
  Administered 2015-09-06: 3350 mg via INTRAVENOUS
  Filled 2015-09-06: qty 67

## 2015-09-06 NOTE — Progress Notes (Signed)
[place] NOTE  Patient Care Team: Coral Spikes, DO as PCP - General (Family Medicine)  CHIEF COMPLAINTS/PURPOSE OF CONSULTATION:   # [facility] 2017- COLON CANCER- STAGE IV [s/p right hemi-colectomy; pT4apN2 (7/19LN)]; Pre-op CEA- [id number]-  PET-  mesenteric adenopathy/mediastinal adenopathy/right-sided subclavicular lymph node. April 17th -START FOLFOX;  K-ras-MUTATED  # MSI- STABLE.   HISTORY OF PRESENTING ILLNESS:  Nancy Clark 55 y.o.  female  with a newly diagnosed right-sided colon cancer status post right hemicolectomy is here to to proceed with cycle #1 of FOLFOX chemotherapy.  She has been anxious and did not sleep well overnight. Otherwise no nausea no vomiting.  She admits to intermittent abdominal pain. Otherwise no diarrhea or constipation. Mild fatigue.  ROS: A complete 10 point review of system is done which is negative except mentioned above in history of present illness  MEDICAL HISTORY:  Past Medical History  Diagnosis Date  . Chicken pox   . Cancer [facility])     recently cancer of right colon cancer    SURGICAL HISTORY: Past Surgical History  Procedure Laterality Date  . Shoulder surgery Left   . Ectopic pregnancy surgery    . Colonoscopy    . Breast biopsy Right     2007? pt unsure done by ASA  . Colostomy revision Right 08/09/2015    Procedure: COLON RESECTION RIGHT;  Surgeon: Florene Glen, MD;  Location: [facility] ORS;  Service: General;  Laterality: Right;  . 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  1 pack in 3 days; no alcohol. 2 childern [25 & 17 years] Social History   Social History  . Marital Status: Married    Spouse Name: N/A  . Number of Children: N/A  . Years of Education: N/A   Occupational History  . Not on file.   Social History  Main Topics  . Smoking status: Former Smoker -- 0.25 packs/day for 2 years  . Smokeless tobacco: Never Used     Comment: recently quit  . Alcohol Use: No  . Drug Use: No  . Sexual Activity: Yes   Other Topics Concern  . Not on file   Social History Narrative    FAMILY HISTORY: 1 brother& 1 sister- no cancer. Mom-no cancer Family History  Problem Relation Age of Onset  . Hypertension Mother   . Arthritis Father   . Prostate cancer Father   . Diabetes Maternal Grandmother   . Cancer Maternal Grandfather     colon    ALLERGIES:  has No Known Allergies.  MEDICATIONS:  Current Outpatient Prescriptions  Medication Sig Dispense Refill  . HYDROcodone-acetaminophen (NORCO/VICODIN) 5-325 MG tablet Take 1 tablet by mouth every 6 (six) hours as needed for moderate pain or severe pain. 25 tablet 0  . lidocaine-prilocaine (EMLA) cream Apply 1 application topically as needed. Apply generously over the Mediport 45 minutes prior to chemotherapy. 30 g 0  . ondansetron (ZOFRAN) 8 MG tablet Take 1 tablet (8 mg total) by mouth every 8 (eight) hours as needed for nausea or vomiting (start 3 days; after chemo). 40 tablet 0  . prochlorperazine (COMPAZINE) 10 MG tablet Take 1 tablet (10 mg total) by mouth every 6 (six) hours as needed for nausea or vomiting. 30 tablet 0   No current facility-administered medications for this visit.      Marland Kitchen  PHYSICAL EXAMINATION: ECOG PERFORMANCE STATUS: 0 - Asymptomatic  Filed Vitals:   09/06/15 0905  BP: 161/95  Pulse: 89  Temp: 95.7 F (35.4 C)  Resp: 18   Filed Weights   09/06/15 0905  Weight: 102 lb 1.2 oz (46.3 kg)    GENERAL: Well-nourished well-developed; thin built; Alert, no distress and comfortable.   Accompanied by her mother.  EYES: no pallor or icterus OROPHARYNX: no thrush or ulceration; good dentition  NECK: supple, no masses felt LYMPH:  no palpable lymphadenopathy in the cervical, axillary or inguinal regions LUNGS: clear to  auscultation and  No wheeze or crackles HEART/CVS: regular rate & rhythm and no murmurs; No lower extremity edema ABDOMEN: abdomen soft, non-tender and normal bowel sounds; surgical incisions noted. No infections. Musculoskeletal:no cyanosis of digits and no clubbing  PSYCH: alert & oriented x 3 with fluent speech NEURO: no focal motor/sensory deficits SKIN:  no rashes or significant lesions  LABORATORY DATA:  I have reviewed the data as listed Lab Results  Component Value Date   WBC 7.8 08/27/2015   HGB 12.7 08/27/2015   HCT 38.5 08/27/2015   MCV 82.2 08/27/2015   PLT 254 08/27/2015    Recent Labs  06/08/15 1619 07/27/15 1551  08/10/15 0621 08/11/15 0523 08/27/15 1123  NA 144 141  --  133* 137 137  K 3.9 3.7  --  4.1 3.7 3.7  CL 108 108  --  106 108 106  CO2 29 24  --  '23 24 26  ' GLUCOSE 84 87  --  130* 118* 102*  BUN 9 14  --  '11 7 9  ' CREATININE 0.76 0.88  < > 0.63 0.58 0.64  CALCIUM 9.0 9.1  --  7.9* 8.1* 9.0  GFRNONAA  --  >60  < > >60 >60 >60  GFRAA  --  >60  < > >60 >60 >60  PROT 6.3 7.4  --   --   --  6.7  ALBUMIN 3.6 4.1  --   --   --  3.7  AST 11 15  --   --   --  13*  ALT 7 11*  --   --   --  14  ALKPHOS 86 81  --   --   --  71  BILITOT 0.2 0.3  --   --   --  0.3  < > = values in this interval not displayed.   ASSESSMENT & PLAN:   # Colon cancer- cecal/ right-sided; STAGE IV [intraabdominal/retroperitonel LN/Supracla; K-ras mutated].   # Proceed with palliative chemotherapy with FOLFOX cycle #1 today. Discussed regarding use of Avastin starting cycle #2/2 weeks.   # I discussed potential side effects of Avastin including but not limited to proteinuria wound healing issues elevated blood pressure.  # Elevated blood pressure today 177 systolic question anxiety; Monitor for now.   Follow-up in 2 weeks with FOLFOX chemotherapy plus Avastin plus labs and M.D. visit  #     Cammie Sickle, MD 09/06/2015 9:23 AM

## 2015-09-08 ENCOUNTER — Inpatient Hospital Stay: Payer: 59

## 2015-09-08 DIAGNOSIS — Z9049 Acquired absence of other specified parts of digestive tract: Secondary | ICD-10-CM | POA: Diagnosis not present

## 2015-09-08 DIAGNOSIS — G4709 Other insomnia: Secondary | ICD-10-CM | POA: Diagnosis not present

## 2015-09-08 DIAGNOSIS — R109 Unspecified abdominal pain: Secondary | ICD-10-CM | POA: Diagnosis not present

## 2015-09-08 DIAGNOSIS — C778 Secondary and unspecified malignant neoplasm of lymph nodes of multiple regions: Secondary | ICD-10-CM | POA: Diagnosis not present

## 2015-09-08 DIAGNOSIS — C182 Malignant neoplasm of ascending colon: Secondary | ICD-10-CM | POA: Diagnosis not present

## 2015-09-08 DIAGNOSIS — Z5111 Encounter for antineoplastic chemotherapy: Secondary | ICD-10-CM | POA: Diagnosis not present

## 2015-09-08 DIAGNOSIS — Z9889 Other specified postprocedural states: Secondary | ICD-10-CM | POA: Diagnosis not present

## 2015-09-08 DIAGNOSIS — F419 Anxiety disorder, unspecified: Secondary | ICD-10-CM | POA: Diagnosis not present

## 2015-09-08 DIAGNOSIS — R630 Anorexia: Secondary | ICD-10-CM | POA: Diagnosis not present

## 2015-09-08 MED ORDER — HEPARIN SOD (PORK) LOCK FLUSH 100 UNIT/ML IV SOLN
500.0000 [IU] | Freq: Once | INTRAVENOUS | Status: AC | PRN
  Administered 2015-09-08: 500 [IU]

## 2015-09-08 MED ORDER — HEPARIN SOD (PORK) LOCK FLUSH 100 UNIT/ML IV SOLN
INTRAVENOUS | Status: AC
  Filled 2015-09-08: qty 5

## 2015-09-08 MED ORDER — SODIUM CHLORIDE 0.9% FLUSH
10.0000 mL | INTRAVENOUS | Status: DC | PRN
  Administered 2015-09-08: 10 mL
  Filled 2015-09-08: qty 10

## 2015-09-09 ENCOUNTER — Ambulatory Visit (INDEPENDENT_AMBULATORY_CARE_PROVIDER_SITE_OTHER): Payer: 59 | Admitting: General Surgery

## 2015-09-09 ENCOUNTER — Encounter: Payer: Self-pay | Admitting: General Surgery

## 2015-09-09 VITALS — BP 141/93 | HR 120 | Temp 98.3°F | Wt 100.0 lb

## 2015-09-09 DIAGNOSIS — Z4889 Encounter for other specified surgical aftercare: Secondary | ICD-10-CM

## 2015-09-09 NOTE — Patient Instructions (Signed)
If you notice any redness on your port area, please give [place] a call.  Try to eat high calorie foods: milk shakes with lots of protein.  Get a Multi-Vitamin with Iron to take daily.   Please continue to see your Oncologist.

## 2015-09-09 NOTE — Progress Notes (Signed)
Outpatient Surgical Follow Up  09/09/2015  Nancy Clark is an 55 y.o. female.   Chief Complaint  Patient presents with  . Routine Post Op    Port Placement 09/01/2015     HPI: 55 year old patient returns to clinic for evaluation after placement of Port-A-Cath. Patient reports that the area in 10 years to be tender however is improving. It has already been accessed and used for her first round of chemotherapy. She states she's had decreased appetite and decreased ability to eat secondary to early satiety since beginning treatment for her cancer. She denies any fevers, chills, chest pain, short of breath, diarrhea, constipation. She is having regular bowel movements about every 2-3 days. She states she's had a positive surgical experience is appreciative of our services.  Past Medical History  Diagnosis Date  . Chicken pox   . Colon cancer [facility])     Past Surgical History  Procedure Laterality Date  . Shoulder surgery Left   . Ectopic pregnancy surgery    . Colonoscopy    . Breast biopsy Right     2007? pt unsure done by [facility]  . Colostomy revision Right 08/09/2015    Procedure: COLON RESECTION RIGHT;  Surgeon: Florene Glen, MD;  Location: [facility] ORS;  Service: General;  Laterality: Right;  . Portacath placement N/A 09/01/2015    Procedure: INSERTION PORT-A-CATH;  Surgeon: Florene Glen, MD;  Location: [facility] ORS;  Service: General;  Laterality: N/A;    Family History  Problem Relation Age of Onset  . Hypertension Mother   . Arthritis Father   . Prostate cancer Father   . Diabetes Maternal Grandmother   . Colon cancer Maternal Grandfather     colon    Social History:  reports that she has quit smoking. She has never used smokeless tobacco. She reports that she does not drink alcohol or use illicit drugs.  Allergies: No Known Allergies  Medications reviewed.    ROS A multipoint review of systems was completed, all pertinent positives and negatives are documented in  the history of present illness and remainder are negative.   BP 141/93 mmHg  Pulse 120  Temp(Src) 98.3 F (36.8 C)  Wt 45.36 kg (100 lb)  LMP  (LMP Unknown)  Physical Exam Gen.: No acute distress Chest: Clear to auscultation, Port-A-Cath in place in the left upper chest with Steri-Strips in place. No evidence of erythema or drainage. Easily palpable Port-A-Cath Heart: Tachycardic Abdomen: Soft, nontender. Well-healed transverse incision without evidence of erythema or drainage.    No results found for this or any previous visit (from the past 48 hour(s)). No results found.  Assessment/Plan:  1. Aftercare following surgery 55 year old female with colon cancer here status post Port-A-Cath placement. Doing well. Discussed signs and symptoms of infection from either surgical site. Patient voiced understanding. She'll follow up with surgery on an as-needed basis.     Clayburn Pert, MD FACS General Surgeon  09/09/2015,2:08 PM

## 2015-09-20 ENCOUNTER — Inpatient Hospital Stay: Payer: 59

## 2015-09-20 ENCOUNTER — Inpatient Hospital Stay: Payer: 59 | Attending: Internal Medicine

## 2015-09-20 ENCOUNTER — Inpatient Hospital Stay (HOSPITAL_BASED_OUTPATIENT_CLINIC_OR_DEPARTMENT_OTHER): Payer: 59 | Admitting: Internal Medicine

## 2015-09-20 VITALS — BP 147/94 | HR 107 | Temp 97.6°F | Resp 18 | Wt 100.5 lb

## 2015-09-20 VITALS — HR 95

## 2015-09-20 DIAGNOSIS — R5383 Other fatigue: Secondary | ICD-10-CM | POA: Diagnosis not present

## 2015-09-20 DIAGNOSIS — Z800 Family history of malignant neoplasm of digestive organs: Secondary | ICD-10-CM

## 2015-09-20 DIAGNOSIS — D701 Agranulocytosis secondary to cancer chemotherapy: Secondary | ICD-10-CM | POA: Insufficient documentation

## 2015-09-20 DIAGNOSIS — Z79899 Other long term (current) drug therapy: Secondary | ICD-10-CM | POA: Insufficient documentation

## 2015-09-20 DIAGNOSIS — C182 Malignant neoplasm of ascending colon: Secondary | ICD-10-CM

## 2015-09-20 DIAGNOSIS — T451X5S Adverse effect of antineoplastic and immunosuppressive drugs, sequela: Secondary | ICD-10-CM | POA: Insufficient documentation

## 2015-09-20 DIAGNOSIS — Z9049 Acquired absence of other specified parts of digestive tract: Secondary | ICD-10-CM | POA: Insufficient documentation

## 2015-09-20 DIAGNOSIS — Z87891 Personal history of nicotine dependence: Secondary | ICD-10-CM

## 2015-09-20 DIAGNOSIS — Z5112 Encounter for antineoplastic immunotherapy: Secondary | ICD-10-CM | POA: Diagnosis not present

## 2015-09-20 DIAGNOSIS — C778 Secondary and unspecified malignant neoplasm of lymph nodes of multiple regions: Secondary | ICD-10-CM | POA: Insufficient documentation

## 2015-09-20 DIAGNOSIS — C772 Secondary and unspecified malignant neoplasm of intra-abdominal lymph nodes: Principal | ICD-10-CM

## 2015-09-20 DIAGNOSIS — Z5111 Encounter for antineoplastic chemotherapy: Secondary | ICD-10-CM | POA: Insufficient documentation

## 2015-09-20 DIAGNOSIS — R42 Dizziness and giddiness: Secondary | ICD-10-CM | POA: Insufficient documentation

## 2015-09-20 DIAGNOSIS — I10 Essential (primary) hypertension: Secondary | ICD-10-CM

## 2015-09-20 DIAGNOSIS — E876 Hypokalemia: Secondary | ICD-10-CM | POA: Diagnosis not present

## 2015-09-20 DIAGNOSIS — K649 Unspecified hemorrhoids: Secondary | ICD-10-CM | POA: Diagnosis not present

## 2015-09-20 DIAGNOSIS — Z8042 Family history of malignant neoplasm of prostate: Secondary | ICD-10-CM | POA: Insufficient documentation

## 2015-09-20 LAB — URINALYSIS COMPLETE WITH MICROSCOPIC (ARMC ONLY)
BILIRUBIN URINE: NEGATIVE
GLUCOSE, UA: NEGATIVE mg/dL
Hgb urine dipstick: NEGATIVE
Ketones, ur: NEGATIVE mg/dL
Nitrite: NEGATIVE
Protein, ur: 30 mg/dL — AB
Specific Gravity, Urine: 1.02 (ref 1.005–1.030)
pH: 5 (ref 5.0–8.0)

## 2015-09-20 LAB — CBC WITH DIFFERENTIAL/PLATELET
BASOS ABS: 0 10*3/uL (ref 0–0.1)
BASOS PCT: 1 %
EOS ABS: 0 10*3/uL (ref 0–0.7)
EOS PCT: 1 %
HCT: 35.8 % (ref 35.0–47.0)
Hemoglobin: 11.9 g/dL — ABNORMAL LOW (ref 12.0–16.0)
Lymphocytes Relative: 22 %
Lymphs Abs: 1.2 10*3/uL (ref 1.0–3.6)
MCH: 27.6 pg (ref 26.0–34.0)
MCHC: 33.4 g/dL (ref 32.0–36.0)
MCV: 82.6 fL (ref 80.0–100.0)
Monocytes Absolute: 0.6 10*3/uL (ref 0.2–0.9)
Monocytes Relative: 11 %
Neutro Abs: 3.4 10*3/uL (ref 1.4–6.5)
Neutrophils Relative %: 65 %
PLATELETS: 183 10*3/uL (ref 150–440)
RBC: 4.33 MIL/uL (ref 3.80–5.20)
RDW: 17.2 % — ABNORMAL HIGH (ref 11.5–14.5)
WBC: 5.2 10*3/uL (ref 3.6–11.0)

## 2015-09-20 LAB — COMPREHENSIVE METABOLIC PANEL
ALBUMIN: 3.4 g/dL — AB (ref 3.5–5.0)
ALT: 14 U/L (ref 14–54)
ANION GAP: 4 — AB (ref 5–15)
AST: 19 U/L (ref 15–41)
Alkaline Phosphatase: 75 U/L (ref 38–126)
BILIRUBIN TOTAL: 0.4 mg/dL (ref 0.3–1.2)
BUN: 11 mg/dL (ref 6–20)
CO2: 29 mmol/L (ref 22–32)
Calcium: 9 mg/dL (ref 8.9–10.3)
Chloride: 107 mmol/L (ref 101–111)
Creatinine, Ser: 0.59 mg/dL (ref 0.44–1.00)
GFR calc Af Amer: >60 mL/min (ref >60)
GLUCOSE: 111 mg/dL — AB (ref 65–99)
POTASSIUM: 3.4 mmol/L — AB (ref 3.5–5.1)
Sodium: 140 mmol/L (ref 135–145)
TOTAL PROTEIN: 6.8 g/dL (ref 6.5–8.1)

## 2015-09-20 MED ORDER — SODIUM CHLORIDE 0.9 % IV SOLN
Freq: Once | INTRAVENOUS | Status: AC
  Administered 2015-09-20: 11:00:00 via INTRAVENOUS
  Filled 2015-09-20: qty 1000

## 2015-09-20 MED ORDER — SODIUM CHLORIDE 0.9 % IV SOLN
2400.0000 mg/m2 | INTRAVENOUS | Status: DC
  Administered 2015-09-20: 3350 mg via INTRAVENOUS
  Filled 2015-09-20: qty 67

## 2015-09-20 MED ORDER — LISINOPRIL-HYDROCHLOROTHIAZIDE 20-25 MG PO TABS: 1.0000 | ORAL_TABLET | Freq: Every day | ORAL | Status: DC

## 2015-09-20 MED ORDER — SODIUM CHLORIDE 0.9% FLUSH
10.0000 mL | Freq: Once | INTRAVENOUS | Status: AC
  Administered 2015-09-20: 10 mL via INTRAVENOUS
  Filled 2015-09-20: qty 10

## 2015-09-20 MED ORDER — LEUCOVORIN CALCIUM INJECTION 350 MG
550.0000 mg | Freq: Once | INTRAVENOUS | Status: AC
  Administered 2015-09-20: 550 mg via INTRAVENOUS
  Filled 2015-09-20: qty 10

## 2015-09-20 MED ORDER — DEXTROSE 5 % IV SOLN
Freq: Once | INTRAVENOUS | Status: AC
  Administered 2015-09-20: 12:00:00 via INTRAVENOUS
  Filled 2015-09-20: qty 1000

## 2015-09-20 MED ORDER — OXALIPLATIN CHEMO INJECTION 100 MG/20ML
85.0000 mg/m2 | Freq: Once | INTRAVENOUS | Status: AC
  Administered 2015-09-20: 120 mg via INTRAVENOUS
  Filled 2015-09-20: qty 20

## 2015-09-20 MED ORDER — BEVACIZUMAB CHEMO INJECTION 400 MG/16ML
5.0000 mg/kg | Freq: Once | INTRAVENOUS | Status: AC
  Administered 2015-09-20: 225 mg via INTRAVENOUS
  Filled 2015-09-20: qty 9

## 2015-09-20 MED ORDER — PALONOSETRON HCL INJECTION 0.25 MG/5ML
0.2500 mg | Freq: Once | INTRAVENOUS | Status: AC
Start: 2015-09-20 — End: 2015-09-20
  Administered 2015-09-20: 0.25 mg via INTRAVENOUS
  Filled 2015-09-20: qty 5

## 2015-09-20 MED ORDER — FLUOROURACIL CHEMO INJECTION 2.5 GM/50ML
400.0000 mg/m2 | Freq: Once | INTRAVENOUS | Status: AC
  Administered 2015-09-20: 550 mg via INTRAVENOUS
  Filled 2015-09-20: qty 11

## 2015-09-20 MED ORDER — DEXAMETHASONE SODIUM PHOSPHATE 100 MG/10ML IJ SOLN
10.0000 mg | Freq: Once | INTRAMUSCULAR | Status: AC
Start: 2015-09-20 — End: 2015-09-20
  Administered 2015-09-20: 10 mg via INTRAVENOUS
  Filled 2015-09-20: qty 1

## 2015-09-20 MED ORDER — HEPARIN SOD (PORK) LOCK FLUSH 100 UNIT/ML IV SOLN: 500.0000 [IU] | Freq: Once | INTRAVENOUS | Status: DC

## 2015-09-20 NOTE — Patient Instructions (Signed)
Hydrochlorothiazide, HCTZ; Lisinopril tablets What is this medicine? HYDROCHLOROTHIAZIDE; LISINOPRIL (hye droe klor oh THYE a zide; lyse IN oh pril) is a combination of a diuretic and an ACE inhibitor. It is used to treat high blood pressure. This medicine may be used for other purposes; ask your health care provider or pharmacist if you have questions. What should I tell my health care provider before I take this medicine? They need to know if you have any of these conditions: -bone marrow disease -decreased urine -heart or blood vessel disease -if you are on a special diet like a low salt diet -immune system problems, like lupus -kidney disease -liver disease -previous swelling of the tongue, face, or lips with difficulty breathing, difficulty swallowing, hoarseness, or tightening of the throat -recent heart attack or stroke -an unusual or allergic reaction to lisinopril, hydrochlorothiazide, sulfa drugs, other medicines, insect venom, foods, dyes, or preservatives -pregnant or trying to get pregnant -breast-feeding How should I use this medicine? Take this medicine by mouth with a glass of water. Follow the directions on the prescription label. You can take it with or without food. If it upsets your stomach, take it with food. Take your medicine at regular intervals. Do not take it more often than directed. Do not stop taking except on your doctor's advice. Talk to your pediatrician regarding the use of this medicine in children. Special care may be needed. Overdosage: If you think you have taken too much of this medicine contact a poison control center or emergency room at once. NOTE: This medicine is only for you. Do not share this medicine with others. What if I miss a dose? If you miss a dose, take it as soon as you can. If it is almost time for your next dose, take only that dose. Do not take double or extra doses. What may interact with this medicine? -barbiturates like  phenobarbital -blood pressure medicines -corticosteroids like prednisone -diabetic medications -diuretics, especially triamterene, spironolactone or amiloride -lithium -NSAIDs like ibuprofen -potassium salts or potassium supplements -prescription pain medicines -skeletal muscle relaxants like tubocurarine -some cholesterol lowering medications like cholestyramine or colestipol This list may not describe all possible interactions. Give your health care provider a list of all the medicines, herbs, non-prescription drugs, or dietary supplements you use. Also tell them if you smoke, drink alcohol, or use illegal drugs. Some items may interact with your medicine. What should I watch for while using this medicine? Visit your doctor or health care professional for regular checks on your progress. Check your blood pressure as directed. Ask your doctor or health care professional what your blood pressure should be and when you should contact him or her. Call your doctor or health care professional if you notice an irregular or fast heart beat. You must not get dehydrated. Ask your doctor or health care professional how much fluid you need to drink a day. Check with him or her if you get an attack of severe diarrhea, nausea and vomiting, or if you sweat a lot. The loss of too much body fluid can make it dangerous for you to take this medicine. Women should inform their doctor if they wish to become pregnant or think they might be pregnant. There is a potential for serious side effects to an unborn child. Talk to your health care professional or pharmacist for more information. You may get drowsy or dizzy. Do not drive, use machinery, or do anything that needs mental alertness until you know how this drug  affects you. Do not stand or sit up quickly, especially if you are an older patient. This reduces the risk of dizzy or fainting spells. Alcohol can make you more drowsy and dizzy. Avoid alcoholic drinks. This  medicine may affect your blood sugar level. If you have diabetes, check with your doctor or health care professional before changing the dose of your diabetic medicine. Avoid salt substitutes unless you are told otherwise by your doctor or health care professional. This medicine can make you more sensitive to the sun. Keep out of the sun. If you cannot avoid being in the sun, wear protective clothing and use sunscreen. Do not use sun lamps or tanning beds/booths. Do not treat yourself for coughs, colds, or pain while you are taking this medicine without asking your doctor or health care professional for advice. Some ingredients may increase your blood pressure. What side effects may I notice from receiving this medicine? Side effects that you should report to your doctor or health care professional as soon as possible: -changes in vision -confusion, dizziness, light headedness or fainting spells -decreased amount of urine passed -difficulty breathing or swallowing, hoarseness, or tightening of the throat -eye pain -fast or irregular heart beat, palpitations, or chest pain -muscle cramps -nausea and vomiting -persistent dry cough -redness, blistering, peeling or loosening of the skin, including inside the mouth -stomach pain -swelling of your face, lips, tongue, hands, or feet -unusual rash, bleeding or bruising, or pinpoint red spots on the skin -worsened gout pain -yellowing of the eyes or skin Side effects that usually do not require medical attention (report to your doctor or health care professional if they continue or are bothersome): -change in sex drive or performance -cough -headache This list may not describe all possible side effects. Call your doctor for medical advice about side effects. You may report side effects to FDA at 1-800-FDA-1088. Where should I keep my medicine? Keep out of the reach of children. Store at room temperature between 20 and 25 degrees C (68 and 77  degrees F). Protect from moisture and excessive light. Keep container tightly closed. Throw away any unused medicine after the expiration date. NOTE: This sheet is a summary. It may not cover all possible information. If you have questions about this medicine, talk to your doctor, pharmacist, or health care provider.    2016, Elsevier/Gold Standard. (2010-01-26 13:33:52)

## 2015-09-20 NOTE — Progress Notes (Signed)
[place] NOTE  Patient Care Team: Coral Spikes, DO as PCP - General (Family Medicine)  CHIEF COMPLAINTS/PURPOSE OF CONSULTATION:   # [facility] 2017- COLON CANCER- STAGE IV [s/p right hemi-colectomy; pT4apN2 (7/19LN)]; Pre-op CEA- [id number]  PET-  mesenteric adenopathy/mediastinal adenopathy/right-sided subclavicular lymph node.  K-ras-MUTATED;  April 17th -START FOLFOX; May 1st Add Avastin  # MSI- STABLE.   HISTORY OF PRESENTING ILLNESS:  Nancy Clark 55 y.o.  female  with a newly diagnosed right-sided colon cancer status post right hemicolectomy is here to to proceed with cycle #2 of FOLFOX chemotherapy.  Patient denies any nausea vomiting. Denies any significant diarrhea. Denies any significant tingling and numbness.   Otherwise no diarrhea or constipation. Mild fatigue.No fever no chills.  ROS: A complete 10 point review of system is done which is negative except mentioned above in history of present illness  MEDICAL HISTORY:  Past Medical History  Diagnosis Date  . Chicken pox   . Colon cancer [facility])     SURGICAL HISTORY: Past Surgical History  Procedure Laterality Date  . Shoulder surgery Left   . Ectopic pregnancy surgery    . Colonoscopy    . Breast biopsy Right     2007? pt unsure done by [facility]  . Colostomy revision Right 08/09/2015    Procedure: COLON RESECTION RIGHT;  Surgeon: Florene Glen, MD;  Location: [facility] ORS;  Service: General;  Laterality: Right;  . 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  1 pack in 3 days; no alcohol. 2 childern [25 & 17 years] Social History   Social History  . Marital Status: Married    Spouse Name: N/A  . Number of Children: N/A  . Years of Education: N/A   Occupational History  . Not on file.   Social History Main Topics   . Smoking status: Former Smoker -- 0.25 packs/day for 2 years  . Smokeless tobacco: Never Used     Comment: recently quit  . Alcohol Use: No  . Drug Use: No  . Sexual Activity: Yes   Other Topics Concern  . Not on file   Social History Narrative    FAMILY HISTORY: 1 brother& 1 sister- no cancer. Mom-no cancer Family History  Problem Relation Age of Onset  . Hypertension Mother   . Arthritis Father   . Prostate cancer Father   . Diabetes Maternal Grandmother   . Colon cancer Maternal Grandfather     colon    ALLERGIES:  has No Known Allergies.  MEDICATIONS:  Current Outpatient Prescriptions  Medication Sig Dispense Refill  . HYDROcodone-acetaminophen (NORCO/VICODIN) 5-325 MG tablet Take 1 tablet by mouth every 6 (six) hours as needed for moderate pain or severe pain. 25 tablet 0  . lidocaine-prilocaine (EMLA) cream Apply 1 application topically as needed. Apply generously over the Mediport 45 minutes prior to chemotherapy. 30 g 0  . ondansetron (ZOFRAN) 8 MG tablet Take 1 tablet (8 mg total) by mouth every 8 (eight) hours as needed for nausea or vomiting (start 3 days; after chemo). 40 tablet 0  . prochlorperazine (COMPAZINE) 10 MG tablet Take 1 tablet (10 mg total) by mouth every 6 (six) hours as needed for nausea or vomiting. 30 tablet 0   No current facility-administered medications for this visit.   Facility-Administered Medications Ordered in  Other Visits  Medication Dose Route Frequency Provider Last Rate Last Dose  . heparin lock flush 100 unit/mL  500 Units Intravenous Once Cammie Sickle, MD          .  PHYSICAL EXAMINATION: ECOG PERFORMANCE STATUS: 0 - Asymptomatic  Filed Vitals:   09/20/15 0936  BP: 147/94  Pulse: 107  Temp: 97.6 F (36.4 C)  Resp: 18   Filed Weights   09/20/15 0936  Weight: 100 lb 8.5 oz (45.6 kg)    GENERAL: Well-nourished well-developed; thin built; Alert, no distress and comfortable.   Accompanied by her mother.   EYES: no pallor or icterus OROPHARYNX: no thrush or ulceration; good dentition  NECK: supple, no masses felt LYMPH:  no palpable lymphadenopathy in the cervical, axillary or inguinal regions LUNGS: clear to auscultation and  No wheeze or crackles HEART/CVS: regular rate & rhythm and no murmurs; No lower extremity edema ABDOMEN: abdomen soft, non-tender and normal bowel sounds; surgical incisions noted. No infections. Musculoskeletal:no cyanosis of digits and no clubbing  PSYCH: alert & oriented x 3 with fluent speech NEURO: no focal motor/sensory deficits SKIN:  no rashes or significant lesions  LABORATORY DATA:  I have reviewed the data as listed Lab Results  Component Value Date   WBC 5.2 09/20/2015   HGB 11.9* 09/20/2015   HCT 35.8 09/20/2015   MCV 82.6 09/20/2015   PLT 183 09/20/2015    Recent Labs  06/08/15 1619 07/27/15 1551  08/10/15 0621 08/11/15 0523 08/27/15 1123  NA 144 141  --  133* 137 137  K 3.9 3.7  --  4.1 3.7 3.7  CL 108 108  --  106 108 106  CO2 29 24  --  23 24 26   GLUCOSE 84 87  --  130* 118* 102*  BUN 9 14  --  11 7 9   CREATININE 0.76 0.88  < > 0.63 0.58 0.64  CALCIUM 9.0 9.1  --  7.9* 8.1* 9.0  GFRNONAA  --  >60  < > >60 >60 >60  GFRAA  --  >60  < > >60 >60 >60  PROT 6.3 7.4  --   --   --  6.7  ALBUMIN 3.6 4.1  --   --   --  3.7  AST 11 15  --   --   --  13*  ALT 7 11*  --   --   --  14  ALKPHOS 86 81  --   --   --  71  BILITOT 0.2 0.3  --   --   --  0.3  < > = values in this interval not displayed.   ASSESSMENT & PLAN:   # Colon cancer- cecal/ right-sided; STAGE IV [intraabdominal/retroperitonel LN/Supracla; K-ras mutated].. Currently on FOLFOX palliative chemotherapy. Status post cycle #1. I again reviewed with the patient that treatments are palliative- and we'll plan to have a CT scan after 6 cycles.  # Proceed with cycle #2 of FOLFOX today. Add Avastin with the cycle today. Discussed the potential side effects including but not limited  to elevated blood pressure [C discussion below]; nephrotic syndrome wound healing problems.  # Elevated blood pressure today 147/90.Start the patient on hydrochlorothiazide-lisinopril. Discussed the potential side effects of hypotension.   Follow-up in 2 weeks with FOLFOX chemotherapy plus Avastin plus labs and M.D. visit     Cammie Sickle, MD 09/20/2015 9:44 AM

## 2015-09-21 ENCOUNTER — Other Ambulatory Visit: Payer: Self-pay | Admitting: Internal Medicine

## 2015-09-21 DIAGNOSIS — C772 Secondary and unspecified malignant neoplasm of intra-abdominal lymph nodes: Secondary | ICD-10-CM

## 2015-09-21 DIAGNOSIS — R809 Proteinuria, unspecified: Secondary | ICD-10-CM

## 2015-09-21 DIAGNOSIS — C182 Malignant neoplasm of ascending colon: Secondary | ICD-10-CM

## 2015-09-22 ENCOUNTER — Inpatient Hospital Stay: Payer: 59

## 2015-09-22 VITALS — BP 128/84 | HR 108 | Resp 20

## 2015-09-22 DIAGNOSIS — Z9049 Acquired absence of other specified parts of digestive tract: Secondary | ICD-10-CM | POA: Diagnosis not present

## 2015-09-22 DIAGNOSIS — E876 Hypokalemia: Secondary | ICD-10-CM | POA: Diagnosis not present

## 2015-09-22 DIAGNOSIS — T451X5S Adverse effect of antineoplastic and immunosuppressive drugs, sequela: Secondary | ICD-10-CM | POA: Diagnosis not present

## 2015-09-22 DIAGNOSIS — C182 Malignant neoplasm of ascending colon: Secondary | ICD-10-CM | POA: Diagnosis not present

## 2015-09-22 DIAGNOSIS — Z5112 Encounter for antineoplastic immunotherapy: Secondary | ICD-10-CM | POA: Diagnosis not present

## 2015-09-22 DIAGNOSIS — C778 Secondary and unspecified malignant neoplasm of lymph nodes of multiple regions: Secondary | ICD-10-CM | POA: Diagnosis not present

## 2015-09-22 DIAGNOSIS — D701 Agranulocytosis secondary to cancer chemotherapy: Secondary | ICD-10-CM | POA: Diagnosis not present

## 2015-09-22 DIAGNOSIS — R5383 Other fatigue: Secondary | ICD-10-CM | POA: Diagnosis not present

## 2015-09-22 DIAGNOSIS — Z5111 Encounter for antineoplastic chemotherapy: Secondary | ICD-10-CM | POA: Diagnosis not present

## 2015-09-22 MED ORDER — HEPARIN SOD (PORK) LOCK FLUSH 100 UNIT/ML IV SOLN
500.0000 [IU] | Freq: Once | INTRAVENOUS | Status: AC | PRN
  Administered 2015-09-22: 500 [IU]
  Filled 2015-09-22: qty 5

## 2015-09-23 ENCOUNTER — Telehealth: Payer: Self-pay | Admitting: *Deleted

## 2015-09-23 NOTE — Telephone Encounter (Signed)
Pt contacted cancer center to inquire about her disability forms.  I explained that forms usually take 7-10 business days to complete. These forms were given to Dr. Rogue Bussing on Monday 09/20/15. She stressed the urgency of completing these forms by tomorrow as her "disability claim needs them asap."

## 2015-09-27 ENCOUNTER — Telehealth: Payer: Self-pay

## 2015-09-27 ENCOUNTER — Telehealth: Payer: Self-pay | Admitting: *Deleted

## 2015-09-27 NOTE — Telephone Encounter (Signed)
Patient states she recently started on BP medication.  She states she is drowsy and dizzy.  I informed patient that when first starting BP medications these are the symptoms that are normal until her body adjusts to the medication.  Please advise of additional instructions.

## 2015-09-27 NOTE — Telephone Encounter (Signed)
RN spoke with Dr. Rogue Bussing. Pt needs to check her bp daily at home. This is an expected side effect of the medication-lisinopril/hctz. Also pt on numerous other medications -antiemetics that could potentially cause drowsiness/dizziness.  Reeducate pt to rise slowly from sitting to standing positions and to check bp on daily basis

## 2015-09-27 NOTE — Telephone Encounter (Signed)
Called patient and left message that she should check her BP on a daily basis. She can keep a journal and bring to her next appointment.   She should also rise slowly to allow her BP to adjust.

## 2015-09-27 NOTE — Telephone Encounter (Signed)
Called patient to ask when she would return to work since her insurance company was requesting a date. I told her that I reviewed Dr. Antionette Char and [facility] notes and they did not specify when she was returning to work. Patient stated that she was finishing with her chemotherapy by mid July. I told her that I would put that information on a return to work note. Patient agreed.

## 2015-10-04 ENCOUNTER — Inpatient Hospital Stay: Payer: 59

## 2015-10-04 ENCOUNTER — Inpatient Hospital Stay (HOSPITAL_BASED_OUTPATIENT_CLINIC_OR_DEPARTMENT_OTHER): Payer: 59 | Admitting: Internal Medicine

## 2015-10-04 VITALS — BP 114/78 | HR 90 | Resp 20

## 2015-10-04 VITALS — BP 124/87 | HR 94 | Temp 94.0°F | Resp 18 | Wt 93.7 lb

## 2015-10-04 DIAGNOSIS — Z87891 Personal history of nicotine dependence: Secondary | ICD-10-CM

## 2015-10-04 DIAGNOSIS — T451X5S Adverse effect of antineoplastic and immunosuppressive drugs, sequela: Secondary | ICD-10-CM | POA: Diagnosis not present

## 2015-10-04 DIAGNOSIS — C778 Secondary and unspecified malignant neoplasm of lymph nodes of multiple regions: Secondary | ICD-10-CM | POA: Diagnosis not present

## 2015-10-04 DIAGNOSIS — C772 Secondary and unspecified malignant neoplasm of intra-abdominal lymph nodes: Secondary | ICD-10-CM

## 2015-10-04 DIAGNOSIS — I10 Essential (primary) hypertension: Secondary | ICD-10-CM

## 2015-10-04 DIAGNOSIS — E876 Hypokalemia: Secondary | ICD-10-CM | POA: Diagnosis not present

## 2015-10-04 DIAGNOSIS — Z9049 Acquired absence of other specified parts of digestive tract: Secondary | ICD-10-CM

## 2015-10-04 DIAGNOSIS — Z800 Family history of malignant neoplasm of digestive organs: Secondary | ICD-10-CM

## 2015-10-04 DIAGNOSIS — D701 Agranulocytosis secondary to cancer chemotherapy: Secondary | ICD-10-CM | POA: Diagnosis not present

## 2015-10-04 DIAGNOSIS — Z8042 Family history of malignant neoplasm of prostate: Secondary | ICD-10-CM

## 2015-10-04 DIAGNOSIS — C182 Malignant neoplasm of ascending colon: Secondary | ICD-10-CM

## 2015-10-04 DIAGNOSIS — Z5111 Encounter for antineoplastic chemotherapy: Secondary | ICD-10-CM | POA: Diagnosis not present

## 2015-10-04 DIAGNOSIS — Z79899 Other long term (current) drug therapy: Secondary | ICD-10-CM

## 2015-10-04 DIAGNOSIS — R42 Dizziness and giddiness: Secondary | ICD-10-CM

## 2015-10-04 DIAGNOSIS — R809 Proteinuria, unspecified: Secondary | ICD-10-CM

## 2015-10-04 DIAGNOSIS — R5383 Other fatigue: Secondary | ICD-10-CM | POA: Diagnosis not present

## 2015-10-04 DIAGNOSIS — Z5112 Encounter for antineoplastic immunotherapy: Secondary | ICD-10-CM | POA: Diagnosis not present

## 2015-10-04 LAB — COMPREHENSIVE METABOLIC PANEL
ALBUMIN: 3.1 g/dL — AB (ref 3.5–5.0)
ALK PHOS: 79 U/L (ref 38–126)
ALT: 12 U/L — AB (ref 14–54)
AST: 17 U/L (ref 15–41)
Anion gap: 6 (ref 5–15)
BILIRUBIN TOTAL: 0.3 mg/dL (ref 0.3–1.2)
BUN: 11 mg/dL (ref 6–20)
CALCIUM: 8.7 mg/dL — AB (ref 8.9–10.3)
CO2: 27 mmol/L (ref 22–32)
Chloride: 103 mmol/L (ref 101–111)
Creatinine, Ser: 0.7 mg/dL (ref 0.44–1.00)
GFR calc Af Amer: >60 mL/min (ref >60)
GFR calc non Af Amer: >60 mL/min (ref >60)
Glucose, Bld: 93 mg/dL (ref 65–99)
Potassium: 3.4 mmol/L — ABNORMAL LOW (ref 3.5–5.1)
SODIUM: 136 mmol/L (ref 135–145)
TOTAL PROTEIN: 6.6 g/dL (ref 6.5–8.1)

## 2015-10-04 LAB — CBC WITH DIFFERENTIAL/PLATELET
BASOS ABS: 0 10*3/uL (ref 0–0.1)
BASOS PCT: 1 %
EOS ABS: 0 10*3/uL (ref 0–0.7)
Eosinophils Relative: 0 %
HEMATOCRIT: 33.6 % — AB (ref 35.0–47.0)
HEMOGLOBIN: 11.4 g/dL — AB (ref 12.0–16.0)
Lymphocytes Relative: 44 %
Lymphs Abs: 1.6 10*3/uL (ref 1.0–3.6)
MCH: 27.5 pg (ref 26.0–34.0)
MCHC: 33.8 g/dL (ref 32.0–36.0)
MCV: 81.4 fL (ref 80.0–100.0)
MONOS PCT: 15 %
Monocytes Absolute: 0.6 10*3/uL (ref 0.2–0.9)
NEUTROS ABS: 1.4 10*3/uL (ref 1.4–6.5)
NEUTROS PCT: 40 %
Platelets: 234 10*3/uL (ref 150–440)
RBC: 4.13 MIL/uL (ref 3.80–5.20)
RDW: 16.7 % — ABNORMAL HIGH (ref 11.5–14.5)
WBC: 3.6 10*3/uL (ref 3.6–11.0)

## 2015-10-04 LAB — PROTEIN / CREATININE RATIO, URINE
CREATININE, URINE: 300 mg/dL
PROTEIN CREATININE RATIO: 0.15 mg/mg{creat} (ref 0.00–0.15)
TOTAL PROTEIN, URINE: 46 mg/dL

## 2015-10-04 MED ORDER — SODIUM CHLORIDE 0.9 % IV SOLN
Freq: Once | INTRAVENOUS | Status: AC
  Administered 2015-10-04: 11:00:00 via INTRAVENOUS
  Filled 2015-10-04: qty 1000

## 2015-10-04 MED ORDER — SODIUM CHLORIDE 0.9% FLUSH
10.0000 mL | INTRAVENOUS | Status: AC | PRN
  Administered 2015-10-04: 10 mL via INTRAVENOUS
  Filled 2015-10-04: qty 10

## 2015-10-04 MED ORDER — HEPARIN SOD (PORK) LOCK FLUSH 100 UNIT/ML IV SOLN
500.0000 [IU] | Freq: Once | INTRAVENOUS | Status: AC | PRN
  Administered 2015-10-04: 500 [IU]

## 2015-10-04 MED ORDER — PALONOSETRON HCL INJECTION 0.25 MG/5ML
0.2500 mg | Freq: Once | INTRAVENOUS | Status: AC
  Administered 2015-10-04: 0.25 mg via INTRAVENOUS
  Filled 2015-10-04: qty 5

## 2015-10-04 MED ORDER — FLUOROURACIL CHEMO INJECTION 2.5 GM/50ML
400.0000 mg/m2 | Freq: Once | INTRAVENOUS | Status: AC
  Administered 2015-10-04: 550 mg via INTRAVENOUS
  Filled 2015-10-04: qty 11

## 2015-10-04 MED ORDER — OXALIPLATIN CHEMO INJECTION 100 MG/20ML
85.0000 mg/m2 | Freq: Once | INTRAVENOUS | Status: AC
  Administered 2015-10-04: 120 mg via INTRAVENOUS
  Filled 2015-10-04: qty 20

## 2015-10-04 MED ORDER — LEUCOVORIN CALCIUM INJECTION 350 MG
550.0000 mg | Freq: Once | INTRAVENOUS | Status: AC
  Administered 2015-10-04: 550 mg via INTRAVENOUS
  Filled 2015-10-04: qty 10

## 2015-10-04 MED ORDER — DEXTROSE 5 % IV SOLN
Freq: Once | INTRAVENOUS | Status: AC
  Administered 2015-10-04: 12:00:00 via INTRAVENOUS
  Filled 2015-10-04: qty 1000

## 2015-10-04 MED ORDER — SODIUM CHLORIDE 0.9 % IV SOLN
10.0000 mg | Freq: Once | INTRAVENOUS | Status: AC
  Administered 2015-10-04: 10 mg via INTRAVENOUS
  Filled 2015-10-04: qty 1

## 2015-10-04 MED ORDER — SODIUM CHLORIDE 0.9 % IV SOLN
2400.0000 mg/m2 | INTRAVENOUS | Status: DC
  Administered 2015-10-04: 3350 mg via INTRAVENOUS
  Filled 2015-10-04: qty 67

## 2015-10-04 MED ORDER — SODIUM CHLORIDE 0.9 % IV SOLN
5.0000 mg/kg | Freq: Once | INTRAVENOUS | Status: AC
  Administered 2015-10-04: 225 mg via INTRAVENOUS
  Filled 2015-10-04: qty 9

## 2015-10-04 NOTE — Progress Notes (Signed)
[place] NOTE  Patient Care Team: Coral Spikes, DO as PCP - General (Family Medicine)  CHIEF COMPLAINTS/PURPOSE OF CONSULTATION:   # [facility] 2017- COLON CANCER- STAGE IV [s/p right hemi-colectomy; pT4apN2 (7/19LN)]; Pre-op CEA- [id number]  PET-  mesenteric adenopathy/mediastinal adenopathy/right-sided subclavicular lymph node.  K-ras-MUTATED;  April 17th -START FOLFOX; May 1st Add Avastin  # MSI- STABLE.   HISTORY OF PRESENTING ILLNESS:  Nancy Clark 55 y.o.  female  with a newly diagnosed right-sided colon cancer status post right hemicolectomy is here Status post cycle #2 of FOLFOX + Avastin.   Patient was started on lisinopril hydrochlorothiazide 2 weeks ago for elevated blood pressure. Patient stated that she has been tired/extremely fatigued and dizzy for about a week while she was on the antihypertensive. She stopped it started feeling much better.    Patient denies any nausea vomiting. Denies any significant diarrhea. Denies any significant tingling and numbness.  No constipation. No fever no chills.  ROS: A complete 10 point review of system is done which is negative except mentioned above in history of present illness  MEDICAL HISTORY:  Past Medical History  Diagnosis Date  . Chicken pox   . Colon cancer [facility])     SURGICAL HISTORY: Past Surgical History  Procedure Laterality Date  . Shoulder surgery Left   . Ectopic pregnancy surgery    . Colonoscopy    . Breast biopsy Right     2007? pt unsure done by [facility]  . Colostomy revision Right 08/09/2015    Procedure: COLON RESECTION RIGHT;  Surgeon: Florene Glen, MD;  Location: [facility] ORS;  Service: General;  Laterality: Right;  . 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  1 pack in 3 days; no alcohol. 2 childern [25 &  17 years] Social History   Social History  . Marital Status: Married    Spouse Name: N/A  . Number of Children: N/A  . Years of Education: N/A   Occupational History  . Not on file.   Social History Main Topics  . Smoking status: Former Smoker -- 0.25 packs/day for 2 years  . Smokeless tobacco: Never Used     Comment: recently quit  . Alcohol Use: No  . Drug Use: No  . Sexual Activity: Yes   Other Topics Concern  . Not on file   Social History Narrative    FAMILY HISTORY: 1 brother& 1 sister- no cancer. Mom-no cancer Family History  Problem Relation Age of Onset  . Hypertension Mother   . Arthritis Father   . Prostate cancer Father   . Diabetes Maternal Grandmother   . Colon cancer Maternal Grandfather     colon    ALLERGIES:  has No Known Allergies.  MEDICATIONS:  Current Outpatient Prescriptions  Medication Sig Dispense Refill  . HYDROcodone-acetaminophen (NORCO/VICODIN) 5-325 MG tablet Take 1 tablet by mouth every 6 (six) hours as needed for moderate pain or severe pain. 25 tablet 0  . lidocaine-prilocaine (EMLA) cream Apply 1 application topically as needed. Apply generously over the Mediport 45 minutes prior to chemotherapy. 30 g 0  . ondansetron (ZOFRAN) 8 MG tablet Take 1 tablet (8 mg total) by mouth every 8 (eight) hours as needed for nausea or vomiting (start 3 days; after chemo). 40 tablet 0  . prochlorperazine (COMPAZINE) 10  MG tablet Take 1 tablet (10 mg total) by mouth every 6 (six) hours as needed for nausea or vomiting. 30 tablet 0  . lisinopril-hydrochlorothiazide (PRINZIDE,ZESTORETIC) 20-25 MG tablet Take 1 tablet by mouth daily. (Patient not taking: Reported on 10/04/2015) 90 tablet 3   No current facility-administered medications for this visit.   Facility-Administered Medications Ordered in Other Visits  Medication Dose Route Frequency Provider Last Rate Last Dose  . sodium chloride flush (NS) 0.9 % injection 10 mL  10 mL Intravenous PRN Cammie Sickle, MD   10 mL at 10/04/15 0901      .  PHYSICAL EXAMINATION: ECOG PERFORMANCE STATUS: 0 - Asymptomatic  Filed Vitals:   10/04/15 0940  BP: 124/87  Pulse: 94  Temp: 94 F (34.4 C)  Resp: 18   Filed Weights   10/04/15 0940  Weight: 93 lb 11.1 oz (42.5 kg)    GENERAL: Well-nourished well-developed; thin built; Alert, no distress and comfortable.   Accompanied by her mother.  EYES: no pallor or icterus OROPHARYNX: no thrush or ulceration; good dentition  NECK: supple, no masses felt LYMPH:  no palpable lymphadenopathy in the cervical, axillary or inguinal regions LUNGS: clear to auscultation and  No wheeze or crackles HEART/CVS: regular rate & rhythm and no murmurs; No lower extremity edema ABDOMEN: abdomen soft, non-tender and normal bowel sounds; surgical incisions noted. No infections. Musculoskeletal:no cyanosis of digits and no clubbing  PSYCH: alert & oriented x 3 with fluent speech NEURO: no focal motor/sensory deficits SKIN:  no rashes or significant lesions  LABORATORY DATA:  I have reviewed the data as listed Lab Results  Component Value Date   WBC 3.6 10/04/2015   HGB 11.4* 10/04/2015   HCT 33.6* 10/04/2015   MCV 81.4 10/04/2015   PLT 234 10/04/2015    Recent Labs  08/27/15 1123 09/20/15 0906 10/04/15 0901  NA 137 140 136  K 3.7 3.4* 3.4*  CL 106 107 103  CO2 _0 GLUCOSE 102* 111* 93  BUN _1 CREATININE 0.64 0.59 0.70  CALCIUM 9.0 9.0 8.7*  GFRNONAA >60 >60 >60  GFRAA >60 >60 >60  PROT 6.7 6.8 6.6  ALBUMIN 3.7 3.4* 3.1*  AST 13* 19 17  ALT 14 14 12*  ALKPHOS 71 75 79  BILITOT 0.3 0.4 0.3     ASSESSMENT & PLAN:   # Colon cancer- cecal/ right-sided; STAGE IV [intraabdominal/retroperitonel LN/Supracla; K-ras mutated].. Currently on FOLFOX palliative chemotherapy. I again reviewed with the patient that treatments are palliative- and we'll plan to have a CT scan after 6 cycles.  # Proceed with cycle #3 of FOLFOX today;  Avastin #2. Labs today CBC CMP unremarkable.  # Elevated blood pressure 2 weeks ago- 147/90. poor tolerance to lisinopril-hydrocodone. Today blood pressure is 120s. Okay to proceed with Avastin.  # Urine protein [29m/dl] noted on urinalysis; we'll check urine protein creatinine ratio.  # Follow-up in 2 weeks with FOLFOX chemotherapy plus Avastin plus labs and M.D. Visit on wed/ 31st.     GCammie Sickle MD 10/04/2015 10:00 AM

## 2015-10-06 ENCOUNTER — Inpatient Hospital Stay: Payer: 59

## 2015-10-06 VITALS — BP 117/78 | HR 90 | Temp 97.1°F | Resp 18

## 2015-10-06 DIAGNOSIS — R5383 Other fatigue: Secondary | ICD-10-CM | POA: Diagnosis not present

## 2015-10-06 DIAGNOSIS — C182 Malignant neoplasm of ascending colon: Secondary | ICD-10-CM

## 2015-10-06 DIAGNOSIS — Z5112 Encounter for antineoplastic immunotherapy: Secondary | ICD-10-CM | POA: Diagnosis not present

## 2015-10-06 DIAGNOSIS — T451X5S Adverse effect of antineoplastic and immunosuppressive drugs, sequela: Secondary | ICD-10-CM | POA: Diagnosis not present

## 2015-10-06 DIAGNOSIS — C778 Secondary and unspecified malignant neoplasm of lymph nodes of multiple regions: Secondary | ICD-10-CM | POA: Diagnosis not present

## 2015-10-06 DIAGNOSIS — D701 Agranulocytosis secondary to cancer chemotherapy: Secondary | ICD-10-CM | POA: Diagnosis not present

## 2015-10-06 DIAGNOSIS — Z9049 Acquired absence of other specified parts of digestive tract: Secondary | ICD-10-CM | POA: Diagnosis not present

## 2015-10-06 DIAGNOSIS — Z5111 Encounter for antineoplastic chemotherapy: Secondary | ICD-10-CM | POA: Diagnosis not present

## 2015-10-06 DIAGNOSIS — E876 Hypokalemia: Secondary | ICD-10-CM | POA: Diagnosis not present

## 2015-10-06 MED ORDER — SODIUM CHLORIDE 0.9% FLUSH
10.0000 mL | INTRAVENOUS | Status: DC | PRN
  Administered 2015-10-06: 10 mL
  Filled 2015-10-06: qty 10

## 2015-10-06 MED ORDER — SODIUM CHLORIDE 0.9% FLUSH
10.0000 mL | INTRAVENOUS | Status: DC | PRN
  Filled 2015-10-06: qty 10

## 2015-10-06 MED ORDER — HEPARIN SOD (PORK) LOCK FLUSH 100 UNIT/ML IV SOLN
500.0000 [IU] | Freq: Once | INTRAVENOUS | Status: AC | PRN
  Administered 2015-10-06: 500 [IU]
  Filled 2015-10-06: qty 5

## 2015-10-06 MED ORDER — HEPARIN SOD (PORK) LOCK FLUSH 100 UNIT/ML IV SOLN: 500.0000 [IU] | Freq: Once | INTRAVENOUS | Status: DC

## 2015-10-12 ENCOUNTER — Inpatient Hospital Stay
Admission: EM | Admit: 2015-10-12 | Discharge: 2015-10-14 | DRG: 393 | Disposition: A | Discharge status: Home / Self Care | Payer: 59 | Attending: Internal Medicine | Admitting: Internal Medicine

## 2015-10-12 ENCOUNTER — Encounter: Payer: Self-pay | Admitting: Emergency Medicine

## 2015-10-12 ENCOUNTER — Telehealth: Payer: Self-pay | Admitting: *Deleted

## 2015-10-12 DIAGNOSIS — Z8249 Family history of ischemic heart disease and other diseases of the circulatory system: Secondary | ICD-10-CM

## 2015-10-12 DIAGNOSIS — Z87891 Personal history of nicotine dependence: Secondary | ICD-10-CM

## 2015-10-12 DIAGNOSIS — Z980 Intestinal bypass and anastomosis status: Secondary | ICD-10-CM | POA: Diagnosis not present

## 2015-10-12 DIAGNOSIS — C182 Malignant neoplasm of ascending colon: Secondary | ICD-10-CM | POA: Diagnosis not present

## 2015-10-12 DIAGNOSIS — Z8261 Family history of arthritis: Secondary | ICD-10-CM

## 2015-10-12 DIAGNOSIS — E8809 Other disorders of plasma-protein metabolism, not elsewhere classified: Secondary | ICD-10-CM | POA: Diagnosis present

## 2015-10-12 DIAGNOSIS — E46 Unspecified protein-calorie malnutrition: Secondary | ICD-10-CM | POA: Diagnosis present

## 2015-10-12 DIAGNOSIS — K644 Residual hemorrhoidal skin tags: Principal | ICD-10-CM | POA: Diagnosis present

## 2015-10-12 DIAGNOSIS — Z85038 Personal history of other malignant neoplasm of large intestine: Secondary | ICD-10-CM | POA: Diagnosis not present

## 2015-10-12 DIAGNOSIS — K922 Gastrointestinal hemorrhage, unspecified: Secondary | ICD-10-CM | POA: Diagnosis not present

## 2015-10-12 DIAGNOSIS — C772 Secondary and unspecified malignant neoplasm of intra-abdominal lymph nodes: Secondary | ICD-10-CM | POA: Diagnosis not present

## 2015-10-12 DIAGNOSIS — K648 Other hemorrhoids: Secondary | ICD-10-CM | POA: Diagnosis not present

## 2015-10-12 DIAGNOSIS — D123 Benign neoplasm of transverse colon: Secondary | ICD-10-CM | POA: Diagnosis present

## 2015-10-12 DIAGNOSIS — K649 Unspecified hemorrhoids: Secondary | ICD-10-CM | POA: Diagnosis not present

## 2015-10-12 DIAGNOSIS — Z79899 Other long term (current) drug therapy: Secondary | ICD-10-CM

## 2015-10-12 DIAGNOSIS — E876 Hypokalemia: Secondary | ICD-10-CM

## 2015-10-12 DIAGNOSIS — Z800 Family history of malignant neoplasm of digestive organs: Secondary | ICD-10-CM

## 2015-10-12 DIAGNOSIS — K635 Polyp of colon: Secondary | ICD-10-CM | POA: Diagnosis present

## 2015-10-12 DIAGNOSIS — K625 Hemorrhage of anus and rectum: Secondary | ICD-10-CM | POA: Diagnosis not present

## 2015-10-12 DIAGNOSIS — D61810 Antineoplastic chemotherapy induced pancytopenia: Secondary | ICD-10-CM | POA: Diagnosis present

## 2015-10-12 DIAGNOSIS — K921 Melena: Secondary | ICD-10-CM | POA: Diagnosis present

## 2015-10-12 DIAGNOSIS — D62 Acute posthemorrhagic anemia: Secondary | ICD-10-CM | POA: Diagnosis not present

## 2015-10-12 DIAGNOSIS — D649 Anemia, unspecified: Secondary | ICD-10-CM | POA: Diagnosis not present

## 2015-10-12 DIAGNOSIS — E440 Moderate protein-calorie malnutrition: Secondary | ICD-10-CM | POA: Insufficient documentation

## 2015-10-12 DIAGNOSIS — D61818 Other pancytopenia: Secondary | ICD-10-CM | POA: Diagnosis not present

## 2015-10-12 DIAGNOSIS — C189 Malignant neoplasm of colon, unspecified: Secondary | ICD-10-CM | POA: Diagnosis not present

## 2015-10-12 DIAGNOSIS — Z833 Family history of diabetes mellitus: Secondary | ICD-10-CM | POA: Diagnosis not present

## 2015-10-12 DIAGNOSIS — T451X5A Adverse effect of antineoplastic and immunosuppressive drugs, initial encounter: Secondary | ICD-10-CM | POA: Diagnosis present

## 2015-10-12 DIAGNOSIS — C785 Secondary malignant neoplasm of large intestine and rectum: Secondary | ICD-10-CM | POA: Diagnosis present

## 2015-10-12 LAB — GASTROINTESTINAL PANEL BY PCR, STOOL (REPLACES STOOL CULTURE)
ASTROVIRUS: NOT DETECTED
Adenovirus F40/41: NOT DETECTED
Campylobacter species: NOT DETECTED
Cryptosporidium: NOT DETECTED
Cyclospora cayetanensis: NOT DETECTED
E. coli O157: NOT DETECTED
ENTAMOEBA HISTOLYTICA: NOT DETECTED
ENTEROAGGREGATIVE E COLI (EAEC): NOT DETECTED
ENTEROTOXIGENIC E COLI (ETEC): NOT DETECTED
Enteropathogenic E coli (EPEC): NOT DETECTED
GIARDIA LAMBLIA: NOT DETECTED
NOROVIRUS GI/GII: NOT DETECTED
Plesimonas shigelloides: NOT DETECTED
Rotavirus A: NOT DETECTED
Salmonella species: NOT DETECTED
Sapovirus (I, II, IV, and V): NOT DETECTED
Shiga like toxin producing E coli (STEC): NOT DETECTED
Shigella/Enteroinvasive E coli (EIEC): NOT DETECTED
VIBRIO CHOLERAE: NOT DETECTED
Vibrio species: NOT DETECTED
Yersinia enterocolitica: NOT DETECTED

## 2015-10-12 LAB — COMPREHENSIVE METABOLIC PANEL
ALBUMIN: 2.9 g/dL — AB (ref 3.5–5.0)
ALT: 13 U/L — ABNORMAL LOW (ref 14–54)
ANION GAP: 5 (ref 5–15)
AST: 14 U/L — ABNORMAL LOW (ref 15–41)
Alkaline Phosphatase: 60 U/L (ref 38–126)
BILIRUBIN TOTAL: 0.2 mg/dL — AB (ref 0.3–1.2)
BUN: 6 mg/dL (ref 6–20)
CALCIUM: 8.5 mg/dL — AB (ref 8.9–10.3)
CO2: 27 mmol/L (ref 22–32)
Chloride: 109 mmol/L (ref 101–111)
Creatinine, Ser: 0.56 mg/dL (ref 0.44–1.00)
GFR calc non Af Amer: >60 mL/min (ref >60)
GLUCOSE: 98 mg/dL (ref 65–99)
POTASSIUM: 3.2 mmol/L — AB (ref 3.5–5.1)
SODIUM: 141 mmol/L (ref 135–145)
TOTAL PROTEIN: 5.9 g/dL — AB (ref 6.5–8.1)

## 2015-10-12 LAB — CBC
HEMATOCRIT: 30.2 % — AB (ref 35.0–47.0)
HEMOGLOBIN: 9.9 g/dL — AB (ref 12.0–16.0)
MCH: 27.1 pg (ref 26.0–34.0)
MCHC: 32.9 g/dL (ref 32.0–36.0)
MCV: 82.3 fL (ref 80.0–100.0)
Platelets: 133 10*3/uL — ABNORMAL LOW (ref 150–440)
RBC: 3.67 MIL/uL — AB (ref 3.80–5.20)
RDW: 17.2 % — ABNORMAL HIGH (ref 11.5–14.5)
WBC: 3.3 10*3/uL — ABNORMAL LOW (ref 3.6–11.0)

## 2015-10-12 LAB — MAGNESIUM: MAGNESIUM: 1.9 mg/dL (ref 1.7–2.4)

## 2015-10-12 LAB — HEMOGLOBIN: HEMOGLOBIN: 9.8 g/dL — AB (ref 12.0–16.0)

## 2015-10-12 LAB — C DIFFICILE QUICK SCREEN W PCR REFLEX
C DIFFICILE (CDIFF) INTERP: NEGATIVE
C DIFFICILE (CDIFF) TOXIN: NEGATIVE
C Diff antigen: NEGATIVE

## 2015-10-12 LAB — TYPE AND SCREEN
ABO/RH(D): O POS
ANTIBODY SCREEN: NEGATIVE

## 2015-10-12 MED ORDER — ACETAMINOPHEN 650 MG RE SUPP: 650.0000 mg | Freq: Four times a day (QID) | RECTAL | Status: DC | PRN

## 2015-10-12 MED ORDER — METRONIDAZOLE IN NACL 5-0.79 MG/ML-% IV SOLN
500.0000 mg | Freq: Three times a day (TID) | INTRAVENOUS | Status: DC
  Administered 2015-10-12 – 2015-10-14 (×5): 500 mg via INTRAVENOUS
  Filled 2015-10-12 (×9): qty 100

## 2015-10-12 MED ORDER — PANTOPRAZOLE SODIUM 40 MG IV SOLR
40.0000 mg | Freq: Two times a day (BID) | INTRAVENOUS | Status: DC
  Administered 2015-10-12 – 2015-10-13 (×3): 40 mg via INTRAVENOUS
  Filled 2015-10-12 (×3): qty 40

## 2015-10-12 MED ORDER — ACETAMINOPHEN 325 MG PO TABS
650.0000 mg | ORAL_TABLET | Freq: Four times a day (QID) | ORAL | Status: DC | PRN
Start: 2015-10-12 — End: 2015-10-14

## 2015-10-12 MED ORDER — PROCHLORPERAZINE MALEATE 10 MG PO TABS
10.0000 mg | ORAL_TABLET | Freq: Four times a day (QID) | ORAL | Status: DC | PRN
Start: 2015-10-12 — End: 2015-10-14

## 2015-10-12 MED ORDER — CIPROFLOXACIN IN D5W 400 MG/200ML IV SOLN
400.0000 mg | Freq: Two times a day (BID) | INTRAVENOUS | Status: DC
  Administered 2015-10-12 – 2015-10-14 (×4): 400 mg via INTRAVENOUS
  Filled 2015-10-12 (×6): qty 200

## 2015-10-12 MED ORDER — ONDANSETRON HCL 4 MG/2ML IJ SOLN
4.0000 mg | Freq: Four times a day (QID) | INTRAMUSCULAR | Status: DC | PRN
Start: 2015-10-12 — End: 2015-10-14

## 2015-10-12 MED ORDER — POTASSIUM CHLORIDE IN NACL 20-0.9 MEQ/L-% IV SOLN
INTRAVENOUS | Status: DC
  Administered 2015-10-12 – 2015-10-14 (×4): via INTRAVENOUS
  Filled 2015-10-12 (×7): qty 1000

## 2015-10-12 MED ORDER — ONDANSETRON HCL 4 MG PO TABS: 4.0000 mg | ORAL_TABLET | Freq: Four times a day (QID) | ORAL | Status: DC | PRN

## 2015-10-12 MED ORDER — SODIUM CHLORIDE 0.9% FLUSH
3.0000 mL | Freq: Two times a day (BID) | INTRAVENOUS | Status: DC
Start: 2015-10-12 — End: 2015-10-14
  Administered 2015-10-12 – 2015-10-13 (×3): 3 mL via INTRAVENOUS

## 2015-10-12 MED ORDER — HYDROCODONE-ACETAMINOPHEN 5-325 MG PO TABS: 1.0000 | ORAL_TABLET | Freq: Four times a day (QID) | ORAL | Status: DC | PRN

## 2015-10-12 NOTE — H&P (Signed)
[facility] [place] NAME: Nancy Clark    MR#:  [id number]  DATE OF BIRTH:  18-Oct-1960  DATE OF ADMISSION:  10/12/2015  PRIMARY CARE PHYSICIAN: Coral Spikes, DO   REQUESTING/REFERRING PHYSICIAN:   CHIEF COMPLAINT:   Chief Complaint  Patient presents with  . Rectal Bleeding  . Chemo Card    Chemo Card    HISTORY OF PRESENT ILLNESS: Nancy Clark  is a 55 y.o. female with a known history of Cecal/right-sided colon cancer, stage IV, status post colostomy with revision in March 2017, who presents to the hospital with complaints of three-day history of rectal bleeding, loose stools. Patient had the chemotherapy on 10/04/2015 by Dr. Rogue Bussing, ,  she noted some blood in her stools 3 days ago, initially occasional spotting, however, it accelerated over the next 24-48 hours, she had 2 loose bowel movements today with most live blood. The appearance. Stool was described as dark red and tarry, with clots. Patient denied any abdominal pain, nausea, vomiting, fevers. On arrival to emergency room, she was noted to be pancytopenic, her hemoglobin level was found to be 9.9, drop from 11.4 about a week ago. Hospitalist service was contacted for admission.   PAST MEDICAL HISTORY:   Past Medical History  Diagnosis Date  . Chicken pox   . Colon cancer ([place])     PAST SURGICAL HISTORY: Past Surgical History  Procedure Laterality Date  . Shoulder surgery Left   . Ectopic pregnancy surgery    . Colonoscopy    . Breast biopsy Right     2007? pt unsure done by [facility]  . Colostomy revision Right 08/09/2015    Procedure: COLON RESECTION RIGHT;  Surgeon: Florene Glen, MD;  Location: [facility] ORS;  Service: General;  Laterality: Right;  . Portacath placement N/A 09/01/2015    Procedure: INSERTION PORT-A-CATH;  Surgeon: Florene Glen, MD;  Location: [facility] ORS;  Service: General;  Laterality: N/A;    SOCIAL HISTORY:  Social History  Substance Use Topics   . Smoking status: Former Smoker -- 0.25 packs/day for 2 years  . Smokeless tobacco: Never Used     Comment: recently quit  . Alcohol Use: No    FAMILY HISTORY:  Family History  Problem Relation Age of Onset  . Hypertension Mother   . Arthritis Father   . Prostate cancer Father   . Diabetes Maternal Grandmother   . Colon cancer Maternal Grandfather     colon    DRUG ALLERGIES: No Known Allergies  Review of Systems  Constitutional: Negative for fever, chills, weight loss and malaise/fatigue.  HENT: Negative for congestion.   Eyes: Negative for blurred vision and double vision.  Respiratory: Negative for cough, sputum production, shortness of breath and wheezing.   Cardiovascular: Negative for chest pain, palpitations, orthopnea, leg swelling and PND.  Gastrointestinal: Positive for diarrhea and blood in stool. Negative for nausea, vomiting, abdominal pain, constipation and melena.  Genitourinary: Negative for dysuria, urgency, frequency and hematuria.  Musculoskeletal: Negative for falls.  Skin: Negative for rash.  Neurological: Negative for dizziness and weakness.  Psychiatric/Behavioral: Negative for depression and memory loss. The patient is not nervous/anxious.     MEDICATIONS AT HOME:  Prior to Admission medications   Medication Sig Start Date End Date Taking? Authorizing Provider  HYDROcodone-acetaminophen (NORCO/VICODIN) 5-325 MG tablet Take 1 tablet by mouth every 6 (six) hours as needed for moderate pain or severe pain. 09/01/15   Jerrol Banana  Burt Knack, MD  lidocaine-prilocaine (EMLA) cream Apply 1 application topically as needed. Apply generously over the Mediport 45 minutes prior to chemotherapy. 08/27/15   Cammie Sickle, MD  ondansetron (ZOFRAN) 8 MG tablet Take 1 tablet (8 mg total) by mouth every 8 (eight) hours as needed for nausea or vomiting (start 3 days; after chemo). 08/27/15   Cammie Sickle, MD  prochlorperazine (COMPAZINE) 10 MG tablet Take 1 tablet  (10 mg total) by mouth every 6 (six) hours as needed for nausea or vomiting. 08/27/15   Cammie Sickle, MD      PHYSICAL EXAMINATION:   VITAL SIGNS: Blood pressure 132/79, pulse 64, temperature 98.2 F (36.8 C), temperature source Oral, resp. rate 17, height 5' (1.524 m), weight 43.137 kg (95 lb 1.6 oz), SpO2 100 %.  GENERAL:  55 y.o.-year-old patient lying in the bed with no acute distress.  EYES: Pupils equal, round, reactive to light and accommodation. No scleral icterus. Extraocular muscles intact.  HEENT: Head atraumatic, normocephalic. Oropharynx and nasopharynx clear.  NECK:  Supple, no jugular venous distention. No thyroid enlargement, no tenderness.  LUNGS: Normal breath sounds bilaterally, no wheezing, rales,rhonchi or crepitation. No use of accessory muscles of respiration.  CARDIOVASCULAR: S1, S2 normal. No murmurs, rubs, or gallops.  ABDOMEN: Soft, nontender, nondistended. Bowel sounds present. No organomegaly or mass. Hemoccult was performed by emergency room physician was guaiac positive EXTREMITIES: No pedal edema, cyanosis, or clubbing.  NEUROLOGIC: Cranial nerves II through XII are intact. Muscle strength 5/5 in all extremities. Sensation intact. Gait not checked.  PSYCHIATRIC: The patient is alert and oriented x 3.  SKIN: No obvious rash, lesion, or ulcer.   LABORATORY PANEL:   CBC  Recent Labs Lab 10/12/15 1409  WBC 3.3*  HGB 9.9*  HCT 30.2*  PLT 133*  MCV 82.3  MCH 27.1  MCHC 32.9  RDW 17.2*   ------------------------------------------------------------------------------------------------------------------  Chemistries   Recent Labs Lab 10/12/15 1409  NA 141  K 3.2*  CL 109  CO2 27  GLUCOSE 98  BUN 6  CREATININE 0.56  CALCIUM 8.5*  AST 14*  ALT 13*  ALKPHOS 60  BILITOT 0.2*   ------------------------------------------------------------------------------------------------------------------  Cardiac Enzymes No results for input(s):  TROPONINI in the last 168 hours. ------------------------------------------------------------------------------------------------------------------  RADIOLOGY: No results found.  EKG: No orders found for this or any previous visit.  IMPRESSION AND PLAN:  Principal Problem:   Gastrointestinal bleeding Active Problems:   Acute posthemorrhagic anemia   Pancytopenia due to chemotherapy (HCC)   Hypoalbuminemia   Hypokalemia   GIB (gastrointestinal bleeding) #1. Gastrointestinal bleeding, etiology is unclear at present, getting gastroenterologist involved for further recommendations, rule out infectious etiology, get stool cultures, enteric pathogens, C. difficile, get GI bleeding scan if bleeding reoccurs or intensifies. Follow hemoglobin level and transfuse patient as needed. Initiate patient on Protonix intravenously twice a day.  #2. Acute posthemorrhagic anemia, check hemoglobin level every 8 hours and transfuse patient as needed. #3. Pancytopenia due to chemotherapy, initiate patient on broad-spectrum antibiotic therapy with Cipro, Flagyl, follow patient's white cell count in the morning, get oncologist involved for further recommendations #4. Hypokalemia, supplement intravenously, get magnesium level, supplement as needed #5 Hypoalbuminemia, likely malnutrition, patient will be receiving clear liquid diet initially, will ask dietitian to see patient in consultation   All the records are reviewed and case discussed with ED provider. Management plans discussed with the patient, family and they are in agreement.  CODE STATUS: Code Status History    Date Active  Date Inactive Code Status Order ID Comments User Context   08/09/2015 11:53 AM 08/14/2015  8:15 PM Full Code [id number]  Florene Glen, MD Inpatient       TOTAL TIME TAKING CARE OF THIS PATIENT: 50 minutes.    Theodoro Grist M.D on 10/12/2015 at 4:27 PM  Between 7am to 6pm - Pager - [phone] After 6pm go to  [email or web address] - password [email or web address] [place] [facility]  Office  [phone]  CC: Primary care physician; Coral Spikes, DO

## 2015-10-12 NOTE — ED Notes (Signed)
Patient presents to the ED with bright red blood in stools for the last 2-3 days.  Patient had a chemo treatment about 1 week ago.  Patient denies any black/tarry stools.  Patient denies any nausea or vomiting.  Patient states she has had 1-2 loose stools per day.  Patient denies fever or chills.  Patient ambulatory to triage without obvious difficulty.

## 2015-10-12 NOTE — Telephone Encounter (Signed)
Patient called stating she is having bloody stools.  States at first there was a lot of frank blood.  She is still noticing what she describes as "thick pieces of blood" in her stool.  Per MD patient should go to ED.    Called patient and advised her to go to ED.

## 2015-10-12 NOTE — ED Provider Notes (Signed)
Time Seen: Approximately 1240  I have reviewed the triage notes  Chief Complaint: Rectal Bleeding and Chemo Card   History of Present Illness: Nancy Clark is a 55 y.o. female *who presents with some bright red blood in her stool. Patient has a history of right-sided colon cancer. She states she's never had blood in her stool. Patient status post colostomy with revision and March. She has been on chemotherapy and finished her third treatment last Monday. The patient states that she noticed the blood and starting in her bowels 3 days ago with only some occasional spotty bleeding and then seemed to accelerate over the next 24-48 hours. She states that she's had one to 2 loose bowel movements today with mostly blood. She has a hard time quantifying it. It seems to be more toward bright red versus dark and tarry. She denies any lightheadedness or fever at home.   Past Medical History  Diagnosis Date  . Chicken pox   . Colon cancer [facility])     Patient Active Problem List   Diagnosis Date Noted  . 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 Hospital discharge follow-up 08/19/2015  . Rash 08/19/2015  . Colon cancer ([place]) 08/09/2015  . Preventative health care 06/08/2015    Past Surgical History  Procedure Laterality Date  . Shoulder surgery Left   . Ectopic pregnancy surgery    . Colonoscopy    . Breast biopsy Right     2007? pt unsure done by [facility]  . Colostomy revision Right 08/09/2015    Procedure: COLON RESECTION RIGHT;  Surgeon: Florene Glen, MD;  Location: [facility] ORS;  Service: General;  Laterality: Right;  . Portacath placement N/A 09/01/2015    Procedure: INSERTION PORT-A-CATH;  Surgeon: Florene Glen, MD;  Location: [facility] ORS;  Service: General;  Laterality: N/A;    Past Surgical History  Procedure Laterality Date  . Shoulder surgery Left   . Ectopic pregnancy surgery    . Colonoscopy    . Breast biopsy Right     2007? pt  unsure done by [facility]  . Colostomy revision Right 08/09/2015    Procedure: COLON RESECTION RIGHT;  Surgeon: Florene Glen, MD;  Location: [facility] ORS;  Service: General;  Laterality: Right;  . Portacath placement N/A 09/01/2015    Procedure: INSERTION PORT-A-CATH;  Surgeon: Florene Glen, MD;  Location: [facility] ORS;  Service: General;  Laterality: N/A;    Current Outpatient Rx  Name  Route  Sig  Dispense  Refill  . HYDROcodone-acetaminophen (NORCO/VICODIN) 5-325 MG tablet   Oral   Take 1 tablet by mouth every 6 (six) hours as needed for moderate pain or severe pain.   25 tablet   0   . lidocaine-prilocaine (EMLA) cream   Topical   Apply 1 application topically as needed. Apply generously over the Mediport 45 minutes prior to chemotherapy.   30 g   0   . ondansetron (ZOFRAN) 8 MG tablet   Oral   Take 1 tablet (8 mg total) by mouth every 8 (eight) hours as needed for nausea or vomiting (start 3 days; after chemo).   40 tablet   0   . prochlorperazine (COMPAZINE) 10 MG tablet   Oral   Take 1 tablet (10 mg total) by mouth every 6 (six) hours as needed for nausea or vomiting.   30 tablet   0     Allergies:  Review of patient's allergies indicates  no known allergies.  Family History: Family History  Problem Relation Age of Onset  . Hypertension Mother   . Arthritis Father   . Prostate cancer Father   . Diabetes Maternal Grandmother   . Colon cancer Maternal Grandfather     colon    Social History: Social History  Substance Use Topics  . Smoking status: Former Smoker -- 0.25 packs/day for 2 years  . Smokeless tobacco: Never Used     Comment: recently quit  . Alcohol Use: No     Review of Systems:   10 point review of systems was performed and was otherwise negative:  Constitutional: No fever Eyes: No visual disturbances ENT: No sore throat, ear pain Cardiac: No chest pain Respiratory: No shortness of breath, wheezing, or stridor Abdomen: No abdominal pain, no  vomiting, No diarrhea Endocrine: No weight loss, No night sweats Extremities: No peripheral edema, cyanosis Skin: No rashes, easy bruising Neurologic: No focal weakness, trouble with speech or swollowing Urologic: No dysuria, Hematuria, or urinary frequency Patient states her port has been functioning  Physical Exam:  ED Triage Vitals  Enc Vitals Group     BP 10/12/15 1235 140/67 mmHg     Pulse Rate 10/12/15 1235 70     Resp 10/12/15 1235 18     Temp 10/12/15 1235 98.2 F (36.8 C)     Temp Source 10/12/15 1235 Oral     SpO2 10/12/15 1235 100 %     Weight 10/12/15 1235 95 lb 1.6 oz (43.137 kg)     Height 10/12/15 1235 5' (1.524 m)     Head Cir --      Peak Flow --      Pain Score 10/12/15 1236 0     Pain Loc --      Pain Edu? --      Excl. in [place]? --     General: Awake , Alert , and Oriented times 3; GCS 15 Head: Normal cephalic , atraumatic Eyes: Pupils equal , round, reactive to light Nose/Throat: No nasal drainage, patent upper airway without erythema or exudate.  Neck: Supple, Full range of motion, No anterior adenopathy or palpable thyroid masses Lungs: Clear to ascultation without wheezes , rhonchi, or rales Heart: Regular rate, regular rhythm without murmurs , gallops , or rubs Abdomen: Soft, non tender without rebound, guarding , or rigidity; bowel sounds positive and symmetric in all 4 quadrants. No organomegaly .        Extremities: 2 plus symmetric pulses. No edema, clubbing or cyanosis Neurologic: normal ambulation, Motor symmetric without deficits, sensory intact Skin: warm, dry, no rashes Rectal exam with chaperone present shows guaiac positive stool. There appears to be some old external hemorrhoids at the rectal opening. The patient has no palpable masses within the rectal vault. Normal sphincter tone. Stool was somewhat more of a maroon color.  Labs:   All laboratory work was reviewed including any pertinent negatives or positives listed below:  Labs  Reviewed  COMPREHENSIVE METABOLIC PANEL - Abnormal; Notable for the following:    Potassium 3.2 (*)    Calcium 8.5 (*)    Total Protein 5.9 (*)    Albumin 2.9 (*)    AST 14 (*)    ALT 13 (*)    Total Bilirubin 0.2 (*)    All other components within normal limits  CBC - Abnormal; Notable for the following:    WBC 3.3 (*)    RBC 3.67 (*)    Hemoglobin 9.9 (*)  HCT 30.2 (*)    RDW 17.2 (*)    Platelets 133 (*)    All other components within normal limits  URINALYSIS COMPLETEWITH MICROSCOPIC ([facility] ONLY)  POC OCCULT BLOOD, ED  TYPE AND SCREEN  Reviewed the patient's laboratory work shows a drop in her hemoglobin from 11.5-9.9    ED Course:  The patient presents with stable rectal bleeding with a history of colon cancer. Patient has stage IV cancer and has had previous resection source of bleeding may be either from cancer, or diverticulosis, etc.    Assessment:  Stable lower gastrointestinal bleeding History of colon cancer   Final Clinical Impression:  Final diagnoses:  Rectal bleeding     Plan: * Inpatient management            Daymon Larsen, MD 10/12/15 1550

## 2015-10-12 NOTE — ED Notes (Signed)
Attempted to call report at this time.  Receiving nurse unavailable for report at this time.

## 2015-10-12 NOTE — ED Notes (Signed)
Reports bright red blood in stool, loose stools for 3 days.  Skin w/d with good color.  abd soft , BS present.

## 2015-10-12 NOTE — ED Notes (Signed)
Attempted to draw blood from patient's left AC without success.  Patient's preference to now wait until she has a room and can have blood drawn from her port.

## 2015-10-12 NOTE — Telephone Encounter (Signed)
Patient agreed to go to ED as per MD VO.

## 2015-10-13 ENCOUNTER — Inpatient Hospital Stay: Payer: 59

## 2015-10-13 DIAGNOSIS — R5383 Other fatigue: Secondary | ICD-10-CM

## 2015-10-13 DIAGNOSIS — Z87891 Personal history of nicotine dependence: Secondary | ICD-10-CM

## 2015-10-13 DIAGNOSIS — Z8042 Family history of malignant neoplasm of prostate: Secondary | ICD-10-CM

## 2015-10-13 DIAGNOSIS — E440 Moderate protein-calorie malnutrition: Secondary | ICD-10-CM | POA: Insufficient documentation

## 2015-10-13 DIAGNOSIS — D649 Anemia, unspecified: Secondary | ICD-10-CM

## 2015-10-13 DIAGNOSIS — K922 Gastrointestinal hemorrhage, unspecified: Secondary | ICD-10-CM

## 2015-10-13 DIAGNOSIS — C772 Secondary and unspecified malignant neoplasm of intra-abdominal lymph nodes: Secondary | ICD-10-CM

## 2015-10-13 DIAGNOSIS — Z79899 Other long term (current) drug therapy: Secondary | ICD-10-CM

## 2015-10-13 DIAGNOSIS — Z800 Family history of malignant neoplasm of digestive organs: Secondary | ICD-10-CM

## 2015-10-13 DIAGNOSIS — D696 Thrombocytopenia, unspecified: Secondary | ICD-10-CM

## 2015-10-13 DIAGNOSIS — C182 Malignant neoplasm of ascending colon: Secondary | ICD-10-CM

## 2015-10-13 LAB — CBC
HEMATOCRIT: 28.1 % — AB (ref 35.0–47.0)
Hemoglobin: 9.3 g/dL — ABNORMAL LOW (ref 12.0–16.0)
MCH: 27.2 pg (ref 26.0–34.0)
MCHC: 32.9 g/dL (ref 32.0–36.0)
MCV: 82.5 fL (ref 80.0–100.0)
Platelets: 116 10*3/uL — ABNORMAL LOW (ref 150–440)
RBC: 3.41 MIL/uL — ABNORMAL LOW (ref 3.80–5.20)
RDW: 17.1 % — AB (ref 11.5–14.5)
WBC: 3.1 10*3/uL — ABNORMAL LOW (ref 3.6–11.0)

## 2015-10-13 LAB — BASIC METABOLIC PANEL
Anion gap: 4 — ABNORMAL LOW (ref 5–15)
CALCIUM: 8.2 mg/dL — AB (ref 8.9–10.3)
CO2: 25 mmol/L (ref 22–32)
Chloride: 113 mmol/L — ABNORMAL HIGH (ref 101–111)
Creatinine, Ser: 0.6 mg/dL (ref 0.44–1.00)
GFR calc Af Amer: >60 mL/min (ref >60)
GLUCOSE: 81 mg/dL (ref 65–99)
Potassium: 3.3 mmol/L — ABNORMAL LOW (ref 3.5–5.1)
Sodium: 142 mmol/L (ref 135–145)

## 2015-10-13 LAB — HEMOGLOBIN
HEMOGLOBIN: 9.3 g/dL — AB (ref 12.0–16.0)
HEMOGLOBIN: 9.7 g/dL — AB (ref 12.0–16.0)
Hemoglobin: 9.5 g/dL — ABNORMAL LOW (ref 12.0–16.0)

## 2015-10-13 MED ORDER — POLYETHYLENE GLYCOL 3350 17 GM/SCOOP PO POWD
1.0000 | Freq: Once | ORAL | Status: AC
  Administered 2015-10-13: 18:00:00 255 g via ORAL
  Filled 2015-10-13: qty 255

## 2015-10-13 MED ORDER — BISACODYL 5 MG PO TBEC
5.0000 mg | DELAYED_RELEASE_TABLET | Freq: Once | ORAL | Status: AC
Start: 2015-10-13 — End: 2015-10-13
  Administered 2015-10-13: 5 mg via ORAL
  Filled 2015-10-13: qty 1

## 2015-10-13 MED ORDER — POTASSIUM CHLORIDE 20 MEQ PO PACK
40.0000 meq | PACK | Freq: Once | ORAL | Status: AC
  Administered 2015-10-13: 15:00:00 40 meq via ORAL
  Filled 2015-10-13: qty 2

## 2015-10-13 NOTE — Progress Notes (Signed)
Patient ID: Nancy Clark, female   DOB: 1960/07/28, 55 y.o.   MRN: [id number] Pt seen and examined. Please see [name]' notes. Pt with obstructing cecal mass, s/p colon resection, now with rectal bleeding. S/P Chemo x 2. Cannot find Dr. Jackalyn Lombard colonoscopy report. However, pt also had colon polyps as well. No mention of diverticulosis. LGI bleeding from several causes, incl int/ext hemorrhoids, colitis, polyps, anastomotic bleeding, etc. Will prep pt for colonoscopy for tomorrow afternoon. Thanks

## 2015-10-13 NOTE — Progress Notes (Signed)
Initial Nutrition Assessment  DOCUMENTATION CODES:   Non-severe (moderate) malnutrition in context of acute illness/injury however likely progressing to chronic illness  INTERVENTION:   Await diet advancement as medically able Will recommend Ensure Enlive po BID, each supplement provides 350 kcal and 20 grams of protein, once diet advanced   NUTRITION DIAGNOSIS:   Malnutrition related to acute illness as evidenced by moderate depletions of muscle mass, percent weight loss.  GOAL:   Patient will meet greater than or equal to 90% of their needs  MONITOR:   PO intake, Supplement acceptance, Diet advancement, Skin, Weight trends, Labs, I & O's  REASON FOR ASSESSMENT:   Consult Assessment of nutrition requirement/status  ASSESSMENT:   Pt admitted with GI bleed with h/o stage IV colon cancer s/p ostomy with revision March 2017. RD notes pt last chemotherpay 5/15.  Past Medical History  Diagnosis Date  . Chicken pox   . Colon cancer [facility])      Diet Order:  Diet clear liquid Room service appropriate?: Yes; Fluid consistency:: Thin   Pt reports drinking most of her CL this am and reports she is very hungry.   Pt reports her appetite has been doing well recently and that she has made a point of eating 3 meals per day, even if one is a snack during her work shift.  Pt more interested in Ensure then juice CL supplement at this time.   Medications: Protonix, NS with KCl at 144mL/hr Labs: K 3.3, glucose 81   Gastrointestinal Profile: Last BM:  10/12/2015   Nutrition-Focused Physical Exam Findings: Nutrition-Focused physical exam completed. Findings are no fat depletion, mild-moderate muscle depletion notably of lower extremities, and no edema.     Weight Change: Pt reports weight of 140lbs January 2017 (31% weight loss in 5 months) per [facility] weight loss of 6% weight in 1 month. RD also notes on last admission in March pt weight on RD assessment of 113lbs.   Skin:   Reviewed, no issues   Height:   Ht Readings from Last 1 Encounters:  10/12/15 5' (1.524 m)    Weight:   Wt Readings from Last 1 Encounters:  10/12/15 96 lb 4.8 oz (43.681 kg)     BMI:  Body mass index is 18.81 kg/(m^2).  Estimated Nutritional Needs:   Kcal:  1320-1540kcals  Protein:  53-66g protein  Fluid:  >1.5L fluid  EDUCATION NEEDS:   Education needs no appropriate at this time  Dwyane Luo, RD, LDN Pager [phone] Weekend/On-Call Pager [phone]

## 2015-10-13 NOTE — Consult Note (Signed)
[place] NOTE  Patient Care Team: Coral Spikes, DO as PCP - General (Family Medicine)  CHIEF COMPLAINTS/PURPOSE OF CONSULTATION: colon cancer with GI bleed.   HISTORY OF PRESENTING ILLNESS:  Nancy Clark 55 y.o.  female with history of colon cancer stage IV currently on chemotherapy with FOLFOX plus Avastin is currently admitted to the hospital for red blood per rectum.  Patient states that she noted to have blood mixed with stools over the last 1-2 days. It progressively got worse to point that she started to notice blood without stools. Denies any abdominal pain. Denies any cramping. Denies eating anything unusual. No diarrhea. No constipation.  Patient is noted to have hemoglobin of 9.3/down from a baseline of 11. Platelets 116.  ROS: complains of fatigue. Denies any dizziness or syncopal episodes. A complete 10 point review of system is done which is negative except mentioned above in history of present illness  MEDICAL HISTORY:  Past Medical History  Diagnosis Date  . Chicken pox   . Colon cancer [facility])     SURGICAL HISTORY: Past Surgical History  Procedure Laterality Date  . Shoulder surgery Left   . Ectopic pregnancy surgery    . Colonoscopy    . Breast biopsy Right     2007? pt unsure done by ASA  . Colostomy revision Right 08/09/2015    Procedure: COLON RESECTION RIGHT;  Surgeon: Florene Glen, MD;  Location: [facility] ORS;  Service: General;  Laterality: Right;  . Portacath placement N/A 09/01/2015    Procedure: INSERTION PORT-A-CATH;  Surgeon: Florene Glen, MD;  Location: [facility] ORS;  Service: General;  Laterality: N/A;    SOCIAL HISTORY: Social History   Social History  . Marital Status: Married    Spouse Name: N/A  . Number of Children: N/A  . Years of Education: N/A   Occupational History  . Not on file.   Social History Main Topics  . Smoking status: Former Smoker -- 0.25 packs/day for 2 years  . Smokeless tobacco: Never Used      Comment: recently quit  . Alcohol Use: No  . Drug Use: No  . Sexual Activity: Yes   Other Topics Concern  . Not on file   Social History Narrative    FAMILY HISTORY: Family History  Problem Relation Age of Onset  . Hypertension Mother   . Arthritis Father   . Prostate cancer Father   . Diabetes Maternal Grandmother   . Colon cancer Maternal Grandfather     colon    ALLERGIES:  has No Known Allergies.  MEDICATIONS:  Current Facility-Administered Medications  Medication Dose Route Frequency Provider Last Rate Last Dose  . 0.9 % NaCl with KCl 20 mEq/ L  infusion   Intravenous Continuous Theodoro Grist, MD 100 mL/hr at 10/13/15 0917    . acetaminophen (TYLENOL) tablet 650 mg  650 mg Oral Q6H PRN Theodoro Grist, MD       Or  . acetaminophen (TYLENOL) suppository 650 mg  650 mg Rectal Q6H PRN Theodoro Grist, MD      . ciprofloxacin (CIPRO) IVPB 400 mg  400 mg Intravenous Q12H Theodoro Grist, MD   400 mg at 10/13/15 0605  . HYDROcodone-acetaminophen (NORCO/VICODIN) 5-325 MG per tablet 1 tablet  1 tablet Oral Q6H PRN Theodoro Grist, MD      . metroNIDAZOLE (FLAGYL) IVPB 500 mg  500 mg Intravenous Q8H Theodoro Grist, MD   500 mg at 10/13/15 0917  . ondansetron (  ZOFRAN) tablet 4 mg  4 mg Oral Q6H PRN Theodoro Grist, MD       Or  . ondansetron (ZOFRAN) injection 4 mg  4 mg Intravenous Q6H PRN Theodoro Grist, MD      . pantoprazole (PROTONIX) injection 40 mg  40 mg Intravenous Q12H Theodoro Grist, MD   40 mg at 10/13/15 0918  . prochlorperazine (COMPAZINE) tablet 10 mg  10 mg Oral Q6H PRN Theodoro Grist, MD      . sodium chloride flush (NS) 0.9 % injection 3 mL  3 mL Intravenous Q12H Theodoro Grist, MD   3 mL at 10/13/15 0919   Facility-Administered Medications Ordered in Other Encounters  Medication Dose Route Frequency Provider Last Rate Last Dose  . sodium chloride flush (NS) 0.9 % injection 10 mL  10 mL Intravenous PRN Cammie Sickle, MD   10 mL at 10/04/15 0901       .  PHYSICAL EXAMINATION:  Filed Vitals:   10/13/15 0530 10/13/15 1217  BP: 117/61 142/73  Pulse: 65 60  Temp: 98.3 F (36.8 C) 97.7 F (36.5 C)  Resp: 18    Filed Weights   10/12/15 1235 10/12/15 2055  Weight: 95 lb 1.6 oz (43.137 kg) 96 lb 4.8 oz (43.681 kg)    GENERAL: Well-nourished well-developed; Alert, no distress and comfortable.  Accompanied by her mother. EYES: no pallor or icterus OROPHARYNX: no thrush or ulceration; good dentition  NECK: supple, no masses felt LYMPH:  no palpable lymphadenopathy in the cervical, axillary or inguinal regions LUNGS: clear to auscultation and  No wheeze or crackles HEART/CVS: regular rate & rhythm and no murmurs; No lower extremity edema ABDOMEN: abdomen soft, non-tender and normal bowel sounds Musculoskeletal:no cyanosis of digits and no clubbing  PSYCH: alert & oriented x 3 with fluent speech NEURO: no focal motor/sensory deficits SKIN:  no rashes or significant lesions  LABORATORY DATA:  I have reviewed the data as listed Lab Results  Component Value Date   WBC 3.1* 10/13/2015   HGB 9.3* 10/13/2015   HCT 28.1* 10/13/2015   MCV 82.5 10/13/2015   PLT 116* 10/13/2015    Recent Labs  09/20/15 0906 10/04/15 0901 10/12/15 1409 10/13/15 0500  NA 140 136 141 142  K 3.4* 3.4* 3.2* 3.3*  CL 107 103 109 113*  CO2 29 27 27 25   GLUCOSE 111* 93 98 81  BUN 11 11 6  <5*  CREATININE 0.59 0.70 0.56 0.60  CALCIUM 9.0 8.7* 8.5* 8.2*  GFRNONAA >60 >60 >60 >60  GFRAA >60 >60 >60 >60  PROT 6.8 6.6 5.9*  --   ALBUMIN 3.4* 3.1* 2.9*  --   AST 19 17 14*  --   ALT 14 12* 13*  --   ALKPHOS 75 79 60  --   BILITOT 0.4 0.3 0.2*  --      ASSESSMENT & PLAN:   # 55 year old female patient with history of metastatic colon cancer currently on palliative chemotherapy with FOLFOX plus Avastin is currently admitted to the hospital for lower GI bleed.  # Metastatic colon cancer- currently on first line therapy with FOLFOX plus Avastin-  approximately 3 cycles. Question Avastin causing GI bleed.  # Lower GI bleed- agree with GI evaluation- with a colonoscopy. It would be important to find out the etiology of the GI bleed- especially as patient needs Avastin for ongoing chemotherapy.  # Anemia- hemoglobin 9.3/ down from 11 at baseline. Multifactorial GI bleed; with possible bone marrow suppression  #  Thrombocytopenia 116 platelets- mild; this should not be the sole reason for patient's GI bleed.  The above plan of care was discussed with the patient and her mother in detail. All questions were answered.   Thank you Dr. Margaretmary Eddy  for allowing me to participate in the care of your pleasant patient. Please do not hesitate to contact me with questions or concerns in the interim.     Cammie Sickle, MD 10/13/2015 12:43 PM

## 2015-10-13 NOTE — Progress Notes (Signed)
[place] [facility] [place] NAME: Nancy Clark    MR#:  [id number]  DATE OF BIRTH:  1960-06-30  SUBJECTIVE:  CHIEF COMPLAINT:  Patient denies any active bleeding but reports blood in stool during bowel movements. Last bowel movement was yesterday night. Denies any abdominal pain nausea or vomiting. Had colonoscopy in the past  REVIEW OF SYSTEMS:  CONSTITUTIONAL: No fever, fatigue or weakness.  EYES: No blurred or double vision.  EARS, NOSE, AND THROAT: No tinnitus or ear pain.  RESPIRATORY: No cough, shortness of breath, wheezing or hemoptysis.  CARDIOVASCULAR: No chest pain, orthopnea, edema.  GASTROINTESTINAL: No nausea, vomiting, diarrhea or abdominal pain. Reporting blood in the bowel movement and last episode was last night GENITOURINARY: No dysuria, hematuria.  ENDOCRINE: No polyuria, nocturia,  HEMATOLOGY: No anemia, easy bruising or bleeding SKIN: No rash or lesion. MUSCULOSKELETAL: No joint pain or arthritis.   NEUROLOGIC: No tingling, numbness, weakness.  PSYCHIATRY: No anxiety or depression.   DRUG ALLERGIES:  No Known Allergies  VITALS:  Blood pressure 142/73, pulse 60, temperature 97.7 F (36.5 C), temperature source Oral, resp. rate 18, height 5' (1.524 m), weight 43.681 kg (96 lb 4.8 oz), SpO2 100 %.  PHYSICAL EXAMINATION:  GENERAL:  55 y.o.-year-old patient lying in the bed with no acute distress.  EYES: Pupils equal, round, reactive to light and accommodation. No scleral icterus. Extraocular muscles intact.  HEENT: Head atraumatic, normocephalic. Oropharynx and nasopharynx clear.  NECK:  Supple, no jugular venous distention. No thyroid enlargement, no tenderness.  LUNGS: Normal breath sounds bilaterally, no wheezing, rales,rhonchi or crepitation. No use of accessory muscles of respiration.  CARDIOVASCULAR: S1, S2 normal. No murmurs, rubs, or gallops.  ABDOMEN: Soft, nontender, nondistended. Bowel sounds present. No  organomegaly or mass.  EXTREMITIES: No pedal edema, cyanosis, or clubbing.  NEUROLOGIC: Cranial nerves II through XII are intact. Muscle strength 5/5 in all extremities. Sensation intact. Gait not checked.  PSYCHIATRIC: The patient is alert and oriented x 3.  SKIN: No obvious rash, lesion, or ulcer.    LABORATORY PANEL:   CBC  Recent Labs Lab 10/13/15 0500 10/13/15 1307  WBC 3.1*  --   HGB 9.3* 9.5*  HCT 28.1*  --   PLT 116*  --    ------------------------------------------------------------------------------------------------------------------  Chemistries   Recent Labs Lab 10/12/15 1405  10/12/15 1409 10/13/15 0500  NA  --   < > 141 142  K  --   < > 3.2* 3.3*  CL  --   < > 109 113*  CO2  --   < > 27 25  GLUCOSE  --   < > 98 81  BUN  --   < > 6 <5*  CREATININE  --   < > 0.56 0.60  CALCIUM  --   < > 8.5* 8.2*  MG 1.9  --   --   --   AST  --   --  14*  --   ALT  --   --  13*  --   ALKPHOS  --   --  60  --   BILITOT  --   --  0.2*  --   < > = values in this interval not displayed. ------------------------------------------------------------------------------------------------------------------  Cardiac Enzymes No results for input(s): TROPONINI in the last 168 hours. ------------------------------------------------------------------------------------------------------------------  RADIOLOGY:  No results found.  EKG:  No orders found for this or any previous visit.  ASSESSMENT AND PLAN:    #1.  Gastrointestinal bleeding, could be from colitis versus internal hemorrhoids or an anastomotic ulcers Continue clear liquid diet and nothing by mouth after midnight for colonoscopy in a.m. Patient is hemodynamically stable Monitor hemoglobin and hematocrit Appreciate gastroenterology recommendations Follow up in the stool culture and sensitivity. C. difficile is negative Will consider bleeding scan if patient is profusely and actively bleeding   #2. Acute  posthemorrhagic anemia, check hemoglobin level every 8 hours and transfuse patient as needed. Hemoglobin is stable 9.9-9.3  #3. Pancytopenia due to chemotherapy, patient on broad-spectrum antibiotic therapy with Cipro, Flagyl, follow patient's white cell count in the morning Oncology consult is pending   #4. Hypokalemia, supplement intravenously, get magnesium level, supplement as needed  #5 Hypoalbuminemia, likely malnutrition, patient will be receiving clear liquid diet initially, Will follow up with dietitian   All the records are reviewed and case discussed with Care Management/Social Workerr. Management plans discussed with the patient, family and they are in agreement.  CODE STATUS: fc   TOTAL TIME TAKING CARE OF THIS PATIENT: 39  minutes.   POSSIBLE D/C IN 2  DAYS, DEPENDING ON CLINICAL CONDITION.   Nicholes Mango M.D on 10/13/2015 at 4:45 PM  Between 7am to 6pm - Pager - [phone] After 6pm go to [email or web address] - password [email or web address] [place] [facility]  Office  [phone]  CC: Primary care physician; Coral Spikes, DO

## 2015-10-13 NOTE — Progress Notes (Signed)
Pt still passing blood in stool.  C. Diff sample sent in.  Results = negative.  No pain reported.  VSS.

## 2015-10-13 NOTE — Consult Note (Signed)
GI Inpatient Consult Note  Reason for Consult: Rectal bleeding/colon cancer   Attending Requesting Consult: Dr. Ether Griffins  History of Present Illness: Nancy Clark is a 54 y.o. female seen for evaluation of rectal bleeding at the request of Dr. Ether Griffins. PMhx of colon cancer (dx 06/2015), s/p hemicolectomy on 08/09/15. She is undergoing FOLFOX based chemo thearpy. She began chemotherapy 08/2015.  Last had chemo cycle #3 of FOLFOX on 10/04/15.  Reports she was doing well after her last chemo until acutely about 5 days ago. She has had loser stools since resection, usually 3x/day. 5 days ago developed rectal bleeding - mostly BRBPR - painless bleeding. Describes more than a tablespoon and less than 1/2 cup blood with several BM. Denies any abdominal pain with the bleeding, no constipation. No nausea/vomiting. Gaining weight recently.   Last Colonoscopy: 06/2015-Dr. Rayann Heman - fungating completely obstructing mass found at IC valve, circumferential, 30 mm polyp in transverse colon, 5 sessile polyps in rectum.   Last Endoscopy: 06/2015 - Dr. Rayann Heman - normal   Past Medical History:  Past Medical History  Diagnosis Date  . Chicken pox   . Colon cancer [facility])     Problem List: Patient Active Problem List   Diagnosis Date Noted  . Gastrointestinal bleeding 10/12/2015  . Acute posthemorrhagic anemia 10/12/2015  . Pancytopenia due to chemotherapy ([place]) 10/12/2015  . Hypoalbuminemia 10/12/2015  . Hypokalemia 10/12/2015  . GIB (gastrointestinal bleeding) 10/12/2015  . 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 Hospital discharge follow-up 08/19/2015  . Rash 08/19/2015  . Colon cancer ([place]) 08/09/2015  . Preventative health care 06/08/2015    Past Surgical History: Past Surgical History  Procedure Laterality Date  . Shoulder surgery Left   . Ectopic pregnancy surgery    . Colonoscopy    . Breast biopsy Right     2007? pt unsure done by [facility]SA  . Colostomy  revision Right 08/09/2015    Procedure: COLON RESECTION RIGHT;  Surgeon: Florene Glen, MD;  Location: [facility] ORS;  Service: General;  Laterality: Right;  . Portacath placement N/A 09/01/2015    Procedure: INSERTION PORT-A-CATH;  Surgeon: Florene Glen, MD;  Location: [facility] ORS;  Service: General;  Laterality: N/A;    Allergies: No Known Allergies  Home Medications: Prescriptions prior to admission  Medication Sig Dispense Refill Last Dose  . HYDROcodone-acetaminophen (NORCO/VICODIN) 5-325 MG tablet Take 1 tablet by mouth every 6 (six) hours as needed for moderate pain or severe pain. 25 tablet 0 Taking  . lidocaine-prilocaine (EMLA) cream Apply 1 application topically as needed. Apply generously over the Mediport 45 minutes prior to chemotherapy. 30 g 0 10/04/2015 at unknown  . ondansetron (ZOFRAN) 8 MG tablet Take 1 tablet (8 mg total) by mouth every 8 (eight) hours as needed for nausea or vomiting (start 3 days; after chemo). 40 tablet 0 Taking  . prochlorperazine (COMPAZINE) 10 MG tablet Take 1 tablet (10 mg total) by mouth every 6 (six) hours as needed for nausea or vomiting. 30 tablet 0 Taking   Home medication reconciliation was completed with the patient.   Scheduled Inpatient Medications:   . ciprofloxacin  400 mg Intravenous Q12H  . metronidazole  500 mg Intravenous Q8H  . pantoprazole (PROTONIX) IV  40 mg Intravenous Q12H  . sodium chloride flush  3 mL Intravenous Q12H    Continuous Inpatient Infusions:   . 0.9 % NaCl with KCl 20 mEq / L 100 mL/hr at 10/13/15 [id number]  PRN Inpatient Medications:  acetaminophen **OR** acetaminophen, HYDROcodone-acetaminophen, ondansetron **OR** ondansetron (ZOFRAN) IV, prochlorperazine  Family History: family history includes Arthritis in her father; Colon cancer in her maternal grandfather; Diabetes in her maternal grandmother; Hypertension in her mother; Prostate cancer in her father.  The patient's family history is negative for  inflammatory bowel disorders, GI malignancy, or solid organ transplantation.  Social History:   reports that she has quit smoking. She has never used smokeless tobacco. She reports that she does not drink alcohol or use illicit drugs. The patient denies ETOH, tobacco, or drug use.   Review of Systems: Constitutional: Weight is stable.  Eyes: No changes in vision. ENT: No oral lesions, sore throat.  GI: see HPI.  Heme/Lymph: No easy bruising.  CV: No chest pain.  GU: No hematuria.  Integumentary: No rashes.  Neuro: No headaches.  Psych: No depression/anxiety.  Endocrine: No heat/cold intolerance.  Allergic/Immunologic: No urticaria.  Resp: No cough, SOB.  Musculoskeletal: No joint swelling.    Physical Examination: BP 142/73 mmHg  Pulse 60  Temp(Src) 97.7 F (36.5 C) (Oral)  Resp 18  Ht 5' (1.524 m)  Wt 96 lb 4.8 oz (43.681 kg)  BMI 18.81 kg/m2  SpO2 100%  LMP  (LMP Unknown) Gen: NAD, alert and oriented x 4 HEENT: PEERLA, EOMI, Neck: supple, no JVD or thyromegaly Chest: CTA bilaterally, no wheezes, crackles, or other adventitious sounds CV: RRR, no m/g/c/r Abd: soft, NT, ND, +BS in all four quadrants; no HSM, guarding, ridigity, or rebound tenderness Rectal-no stool in rectal vault, clear mucous that was heme + Ext: no edema, well perfused with 2+ pulses, Skin: no rash or lesions noted Lymph: no LAD  Data: Lab Results  Component Value Date   WBC 3.1* 10/13/2015   HGB 9.3* 10/13/2015   HCT 28.1* 10/13/2015   MCV 82.5 10/13/2015   PLT 116* 10/13/2015    Recent Labs Lab 10/12/15 1752 10/13/15 0020 10/13/15 0500  HGB 9.8* 9.3* 9.3*   Lab Results  Component Value Date   NA 142 10/13/2015   K 3.3* 10/13/2015   CL 113* 10/13/2015   CO2 25 10/13/2015   BUN <5* 10/13/2015   CREATININE 0.60 10/13/2015   Lab Results  Component Value Date   ALT 13* 10/12/2015   AST 14* 10/12/2015   ALKPHOS 60 10/12/2015   BILITOT 0.2* 10/12/2015   No results for  input(s): APTT, INR, PTT in the last 168 hours. Assessment/Plan: Nancy Clark is a 55 y.o. 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   1. Rectal bleeding - consider anastomosis site bleeding, AVM, diverticulosis, anal outlet. Hgb dropped 2 gram but WBC and platelet also dropped so may be more related to chemo. Will proceed w/ colonoscopy tomorrow for definitive evaluation. Continue monitoring H/H.   Recommendations:  1. Colonoscopy tomorrow w/ Dr. Candace Cruise.  2. Clear liquids then NPO at MN except prep 3. Monitor H/H  *Case discussed w/ Dr. Candace Cruise.   Thank you for the consult. Please call with questions or concerns.  Ronney Asters, PA-C [place]

## 2015-10-14 ENCOUNTER — Encounter: Payer: Self-pay | Admitting: Anesthesiology

## 2015-10-14 ENCOUNTER — Inpatient Hospital Stay: Payer: 59 | Admitting: Anesthesiology

## 2015-10-14 ENCOUNTER — Encounter
Admission: EM | Disposition: A | Discharge status: Home / Self Care | Payer: Self-pay | Source: Home / Self Care | Attending: Internal Medicine

## 2015-10-14 HISTORY — PX: COLONOSCOPY WITH PROPOFOL: SHX5780

## 2015-10-14 LAB — URINALYSIS COMPLETE WITH MICROSCOPIC (ARMC ONLY)
Bilirubin Urine: NEGATIVE
Glucose, UA: NEGATIVE mg/dL
Ketones, ur: NEGATIVE mg/dL
NITRITE: NEGATIVE
PH: 5 (ref 5.0–8.0)
PROTEIN: NEGATIVE mg/dL
SPECIFIC GRAVITY, URINE: 1.005 (ref 1.005–1.030)

## 2015-10-14 LAB — BASIC METABOLIC PANEL
Anion gap: 4 — ABNORMAL LOW (ref 5–15)
CHLORIDE: 116 mmol/L — AB (ref 101–111)
CO2: 21 mmol/L — AB (ref 22–32)
CREATININE: 0.66 mg/dL (ref 0.44–1.00)
Calcium: 8.1 mg/dL — ABNORMAL LOW (ref 8.9–10.3)
GFR calc Af Amer: >60 mL/min (ref >60)
GFR calc non Af Amer: >60 mL/min (ref >60)
GLUCOSE: 84 mg/dL (ref 65–99)
Potassium: 3.9 mmol/L (ref 3.5–5.1)
Sodium: 141 mmol/L (ref 135–145)

## 2015-10-14 LAB — CBC
HEMATOCRIT: 27.9 % — AB (ref 35.0–47.0)
HEMOGLOBIN: 8.9 g/dL — AB (ref 12.0–16.0)
MCH: 26.5 pg (ref 26.0–34.0)
MCHC: 32 g/dL (ref 32.0–36.0)
MCV: 82.9 fL (ref 80.0–100.0)
Platelets: 101 10*3/uL — ABNORMAL LOW (ref 150–440)
RBC: 3.36 MIL/uL — ABNORMAL LOW (ref 3.80–5.20)
RDW: 17.3 % — ABNORMAL HIGH (ref 11.5–14.5)
WBC: 3.1 10*3/uL — ABNORMAL LOW (ref 3.6–11.0)

## 2015-10-14 SURGERY — COLONOSCOPY WITH PROPOFOL
Anesthesia: General

## 2015-10-14 MED ORDER — ENSURE ENLIVE PO LIQD: 237.0000 mL | Freq: Two times a day (BID) | ORAL | Status: DC

## 2015-10-14 MED ORDER — PROPOFOL 10 MG/ML IV BOLUS
INTRAVENOUS | Status: DC | PRN
  Administered 2015-10-14: 50 mg via INTRAVENOUS

## 2015-10-14 MED ORDER — LIDOCAINE HCL (CARDIAC) 20 MG/ML IV SOLN
INTRAVENOUS | Status: DC | PRN
  Administered 2015-10-14: 60 mg via INTRAVENOUS

## 2015-10-14 MED ORDER — ACETAMINOPHEN 325 MG PO TABS: 650.0000 mg | ORAL_TABLET | Freq: Four times a day (QID) | ORAL | Status: AC | PRN

## 2015-10-14 MED ORDER — FERROUS SULFATE 325 (65 FE) MG PO TABS: 325.0000 mg | ORAL_TABLET | Freq: Every day | ORAL | Status: DC

## 2015-10-14 MED ORDER — SODIUM CHLORIDE 0.9 % IV SOLN
INTRAVENOUS | Status: DC
  Administered 2015-10-14 (×2): via INTRAVENOUS

## 2015-10-14 MED ORDER — PROPOFOL 500 MG/50ML IV EMUL
INTRAVENOUS | Status: DC | PRN
  Administered 2015-10-14: 130 ug/kg/min via INTRAVENOUS

## 2015-10-14 NOTE — Discharge Instructions (Signed)
Activity as tolerated Fiber rich diet. Continue ensure en live feeding supplements twice a day Follow up with oncology in a week Follow-up with primary care physician in a week Follow-up with gastroenterology in a month

## 2015-10-14 NOTE — Progress Notes (Signed)
Discharge information reviewed with patient. Pt verbalized understanding. Pt to be transported home by mother.

## 2015-10-14 NOTE — Op Note (Signed)
[facility] Gastroenterology Patient Name: Nancy Clark Procedure Date: 10/14/2015 9:00 AM MRN: [id number] Account #: [id number] Date of Birth: [birth year] Admit Type: Inpatient Age: 55 Room: [facility] ENDO ROOM 4 Gender: Female Note Status: Finalized Procedure:            Colonoscopy Indications:          Rectal bleeding, Personal history of malignant neoplasm                        of the colon, Personal history of colonic polyps Providers:            Lupita Dawn. Candace Cruise, MD Referring MD:         Barnie Del Lacinda Axon (Referring MD) Medicines:            Monitored Anesthesia Care Complications:        No immediate complications. Procedure:            Pre-Anesthesia Assessment:                       - Prior to the procedure, a History and Physical was                        performed, and patient medications, allergies and                        sensitivities were reviewed. The patient's tolerance of                        previous anesthesia was reviewed.                       - The risks and benefits of the procedure and the                        sedation options and risks were discussed with the                        patient. All questions were answered and informed                        consent was obtained.                       - After reviewing the risks and benefits, the patient                        was deemed in satisfactory condition to undergo the                        procedure.                       After obtaining informed consent, the colonoscope was                        passed under direct vision. Throughout the procedure,                        the patient's blood pressure, pulse, and oxygen  saturations were monitored continuously. The                        Colonoscope was introduced through the anus and                        advanced to the the cecum, identified by appendiceal                        orifice and ileocecal valve. The  colonoscopy was                        performed without difficulty. The patient tolerated the                        procedure well. The quality of the bowel preparation                        was good. Findings:      A small polyp vs granulation tissue was found in the anastomosis. The       polyp was sessile. The polyp was removed with a hot snare. Resection and       retrieval were complete.      The exam was otherwise without abnormality.      The perianal exam findings include non-thrombosed external hemorrhoids.      The exam was otherwise without abnormality.      Sugical anastomosis intact Impression:           - One small polyp at the anastomosis, removed with a                        hot snare. Resected and retrieved.                       - The examination was otherwise normal.                       - Non-thrombosed external hemorrhoids found on perianal                        exam.                       - The examination was otherwise normal. Recommendation:       - Repeat colonoscopy in 1 year for surveillance.                       - The findings and recommendations were discussed with                        the patient.                       - Treat hemorrhoids if rebleeding Procedure Code(s):    --- Professional ---                       [phone], Colonoscopy, flexible; with removal of tumor(s),                        polyp(s), or other lesion(s) by snare technique Diagnosis Code(s):    ---  Professional ---                       D12.6, Benign neoplasm of colon, unspecified                       K64.4, Residual hemorrhoidal skin tags                       K62.5, Hemorrhage of anus and rectum                       Z85.038, Personal history of other malignant neoplasm                        of large intestine                       Z86.010, Personal history of colonic polyps CPT copyright 2016 American Medical [employer]. All rights reserved. The codes documented in this  report are preliminary and upon coder review may  be revised to meet current compliance requirements. Hulen Luster, MD 10/14/2015 9:17:42 AM This report has been signed electronically. Number of Addenda: 0 Note Initiated On: 10/14/2015 9:00 AM Scope Withdrawal Time: 0 hours 5 minutes 12 seconds  Total Procedure Duration: 0 hours 8 minutes 12 seconds       [facility]

## 2015-10-14 NOTE — Transfer of Care (Signed)
Immediate Anesthesia Transfer of Care Note  Patient: Nancy Clark  Procedure(s) Performed: Procedure(s): COLONOSCOPY WITH PROPOFOL (N/A)  Patient Location: Endoscopy Unit  Anesthesia Type:General  Level of Consciousness: awake, alert , oriented and patient cooperative  Airway & Oxygen Therapy: Patient Spontanous Breathing and Patient connected to nasal cannula oxygen  Post-op Assessment: Report given to RN, Post -op Vital signs reviewed and stable and Patient moving all extremities X 4  Post vital signs: Reviewed and stable  Last Vitals:  Filed Vitals:   10/14/15 0508 10/14/15 0845  BP: 142/69 155/77  Pulse: 59 58  Temp: 36.7 C 36.1 C  Resp: 20 16    Last Pain:  Filed Vitals:   10/14/15 0847  PainSc: 0-No pain         Complications: No apparent anesthesia complications

## 2015-10-14 NOTE — Op Note (Signed)
Colonoscopy showed no active bleeding. Pt does have external hemorrhoids, which may have bled. Surgical anastomosis appear intact. Pt has a small polyp or granulation tissue at anastomosis. This was removed in case this was a polyp. Treat external hemorrhoids. Regular diet ordered. Will sign off. Thanks.

## 2015-10-14 NOTE — Anesthesia Preprocedure Evaluation (Signed)
Anesthesia Evaluation  Patient identified by MRN, date of birth, ID band Patient awake    Reviewed: Allergy & Precautions, H&P , NPO status , Patient's Chart, lab work & pertinent test results  History of Anesthesia Complications Negative for: history of anesthetic complications  Airway Mallampati: II  TM Distance: >3 FB Neck ROM: full    Dental  (+) Poor Dentition, Chipped   Pulmonary neg shortness of breath, former smoker,    Pulmonary exam normal breath sounds clear to auscultation       Cardiovascular Exercise Tolerance: Good (-) angina(-) Past MI and (-) DOE negative cardio ROS Normal cardiovascular exam Rhythm:regular Rate:Normal     Neuro/Psych negative neurological ROS  negative psych ROS   GI/Hepatic negative GI ROS, Neg liver ROS, neg GERD  ,  Endo/Other  negative endocrine ROS  Renal/GU negative Renal ROS  negative genitourinary   Musculoskeletal   Abdominal   Peds  Hematology negative hematology ROS (+)   Anesthesia Other Findings Lower GI bleed  Past Medical History:   Chicken pox                                                  Colon cancer (HCC)                                          Past Surgical History:   SHOULDER SURGERY                                Left              ECTOPIC PREGNANCY SURGERY                                     COLONOSCOPY                                                   BREAST BIOPSY                                   Right                Comment:2007? pt unsure done by [facility]   COLOSTOMY REVISION                              Right 08/09/2015      Comment:Procedure: COLON RESECTION RIGHT;  Surgeon:               Florene Glen, MD;  Location: [facility] ORS;                Service: General;  Laterality: Right;   PORTACATH PLACEMENT                             N/A 09/01/2015  Comment:Procedure: INSERTION PORT-A-CATH;  Surgeon:               Florene Glen, MD;   Location: [facility] ORS;                Service: General;  Laterality: N/A;  BMI    Body Mass Index   18.80 kg/m 2      Reproductive/Obstetrics negative OB ROS                             Anesthesia Physical Anesthesia Plan  ASA: III  Anesthesia Plan: General   Post-op Pain Management:    Induction:   Airway Management Planned:   Additional Equipment:   Intra-op Plan:   Post-operative Plan:   Informed Consent: I have reviewed the patients History and Physical, chart, labs and discussed the procedure including the risks, benefits and alternatives for the proposed anesthesia with the patient or authorized representative who has indicated his/her understanding and acceptance.   Dental Advisory Given  Plan Discussed with: Anesthesiologist, CRNA and Surgeon  Anesthesia Plan Comments:         Anesthesia Quick Evaluation

## 2015-10-14 NOTE — Discharge Summary (Signed)
[facility] [place] NAME: Nancy Clark    MR#:  [id number]  DATE OF BIRTH:  05/25/1960  DATE OF ADMISSION:  10/12/2015 ADMITTING PHYSICIAN: Theodoro Grist, MD  DATE OF DISCHARGE: 10/14/2015 PRIMARY CARE PHYSICIAN: Coral Spikes, DO    ADMISSION DIAGNOSIS:  Rectal bleeding [K62.5] GIB (gastrointestinal bleeding) [K92.2]  DISCHARGE DIAGNOSIS:  Lower GI bleed secondary to external hemorrhoids  SECONDARY DIAGNOSIS:   Past Medical History  Diagnosis Date  . Chicken pox   . Colon cancer [facility])     HOSPITAL COURSE:  #1. Gastrointestinal bleeding,  from external hemorrhoids  Status post colonoscopy today has revealed external hemorrhoids and polyp which was removed. No anastomotic ulcers noticed. Okay to discharge patient from GI standpoint Fiber rich diet is advised Patient is hemodynamically stable Monitor hemoglobin and hematocrit Appreciate gastroenterology recommendations. Outpatient GI follow-up is recommended Follow up  stool culture and sensitivity negative C. difficile is negative, we will discontinue antibiotics   #2. Acute posthemorrhagic anemia, check hemoglobin level every 8 hours and transfuse patient as needed. Hemoglobin is stable 9.9-9.3-8.9  #3. 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 Outpatient follow-up with Dr. Burlene Arnt is recommended as scheduled Will discontinue antibiotics  #4. Hypokalemia, supplemented intravenously, potassium at 3.9 today  #5 Hypoalbuminemia, likely malnutrition Appreciate dietary recommendations. Recommending ensure enlive   DISCHARGE CONDITIONS:   Fair  CONSULTS OBTAINED:  Treatment Team:  Cammie Sickle, MD Nicholes Mango, MD   PROCEDURES Colonoscopy  10/14/2015 with external hemorrhoids and polyp which was  removed  DRUG ALLERGIES:  No Known Allergies  DISCHARGE MEDICATIONS:   Current Discharge Medication List    START taking these medications   Details  acetaminophen (TYLENOL) 325 MG tablet Take 2 tablets (650 mg total) by mouth every 6 (six) hours as needed for mild pain (or Fever >/= 101).    feeding supplement, ENSURE ENLIVE, (ENSURE ENLIVE) LIQD Take 237 mLs by mouth 2 (two) times daily between meals. Qty: 60 Bottle, Refills: 3    ferrous sulfate (FERROUSUL) 325 (65 FE) MG tablet Take 1 tablet (325 mg total) by mouth daily with breakfast. Qty: 90 tablet, Refills: 3      CONTINUE these medications which have NOT CHANGED   Details  HYDROcodone-acetaminophen (NORCO/VICODIN) 5-325 MG tablet Take 1 tablet by mouth every 6 (six) hours as needed for moderate pain or severe pain. Qty: 25 tablet, Refills: 0    lidocaine-prilocaine (EMLA) cream Apply 1 application topically as needed. Apply generously over the Mediport 45 minutes prior to chemotherapy. Qty: 30 g, Refills: 0    ondansetron (ZOFRAN) 8 MG tablet Take 1 tablet (8 mg total) by mouth every 8 (eight) hours as needed for nausea or vomiting (start 3 days; after chemo). Qty: 40 tablet, Refills: 0    prochlorperazine (COMPAZINE) 10 MG tablet Take 1 tablet (10 mg total) by mouth every 6 (six) hours as needed for nausea or vomiting. Qty: 30 tablet, Refills: 0         DISCHARGE INSTRUCTIONS:   Activity as tolerated Fiber rich diet. Continue ensure en live feeding supplements twice a day Follow up with oncology in a week Follow-up with primary care physician in a week Follow-up with gastroenterology in a month  DIET:  Fiber rich diet. Continue ensure en live feeding supplements twice a day  DISCHARGE CONDITION:  Fair  ACTIVITY:  Activity as tolerated  OXYGEN:  Home Oxygen: No.   Oxygen Delivery: room air  DISCHARGE LOCATION:  home   If you experience worsening of your admission symptoms, develop shortness of  breath, life threatening emergency, suicidal or homicidal thoughts you must seek medical attention immediately by calling 911 or calling your MD immediately  if symptoms less severe.  You Must read complete instructions/literature along with all the possible adverse reactions/side effects for all the Medicines you take and that have been prescribed to you. Take any new Medicines after you have completely understood and accpet all the possible adverse reactions/side effects.   Please note  You were cared for by a hospitalist during your hospital stay. If you have any questions about your discharge medications or the care you received while you were in the hospital after you are discharged, you can call the unit and asked to speak with the hospitalist on call if the hospitalist that took care of you is not available. Once you are discharged, your primary care physician will handle any further medical issues. Please note that NO REFILLS for any discharge medications will be authorized once you are discharged, as it is imperative that you return to your primary care physician (or establish a relationship with a primary care physician if you do not have one) for your aftercare needs so that they can reassess your need for medications and monitor your lab values.     Today  Chief Complaint  Patient presents with  . Rectal Bleeding   Patient had a colonoscopy today. External hemorrhoids were diagnosed. One polyp was removed and patient tolerated the procedure well. Denies any abdominal pain nausea vomiting. Tolerated diet and wants to go home today  ROS: CONSTITUTIONAL: Denies fevers, chills. Denies any fatigue, weakness.  EYES: Denies blurry vision, double vision, eye pain. EARS, NOSE, THROAT: Denies tinnitus, ear pain, hearing loss. RESPIRATORY: Denies cough, wheeze, shortness of breath.  CARDIOVASCULAR: Denies chest pain, palpitations, edema.  GASTROINTESTINAL: Denies nausea, vomiting, diarrhea,  abdominal pain. Denies bright red blood per rectum. GENITOURINARY: Denies dysuria, hematuria. ENDOCRINE: Denies nocturia or thyroid problems. HEMATOLOGIC AND LYMPHATIC: Denies easy bruising or bleeding. SKIN: Denies rash or lesion. MUSCULOSKELETAL: Denies pain in neck, back, shoulder, knees, hips or arthritic symptoms.  NEUROLOGIC: Denies paralysis, paresthesias.  PSYCHIATRIC: Denies anxiety or depressive symptoms.   VITAL SIGNS:  Blood pressure 140/66, pulse 67, temperature 98.2 F (36.8 C), temperature source Oral, resp. rate 20, height 5' (1.524 m), weight 43.681 kg (96 lb 4.8 oz), SpO2 100 %.  I/O:    Intake/Output Summary (Last 24 hours) at 10/14/15 1428 Last data filed at 10/14/15 1300  Gross per 24 hour  Intake   1200 ml  Output      0 ml  Net   1200 ml    PHYSICAL EXAMINATION:  GENERAL:  55 y.o.-year-old patient lying in the bed with no acute distress.  EYES: Pupils equal, round, reactive to light and accommodation. No scleral icterus. Extraocular muscles intact.  HEENT: Head atraumatic, normocephalic. Oropharynx and nasopharynx clear.  NECK:  Supple, no jugular venous distention. No thyroid enlargement, no tenderness.  LUNGS: Normal breath sounds bilaterally, no wheezing, rales,rhonchi or crepitation. No use of accessory muscles of respiration.  CARDIOVASCULAR: S1, S2 normal. No murmurs, rubs, or gallops.  ABDOMEN: Soft, non-tender, non-distended. Bowel sounds present. No organomegaly or mass.  EXTREMITIES: No pedal edema, cyanosis, or clubbing.  NEUROLOGIC: Cranial nerves II through XII are intact. Muscle strength 5/5 in  all extremities. Sensation intact. Gait not checked.  PSYCHIATRIC: The patient is alert and oriented x 3.  SKIN: No obvious rash, lesion, or ulcer.   DATA REVIEW:   CBC  Recent Labs Lab 10/14/15 0510  WBC 3.1*  HGB 8.9*  HCT 27.9*  PLT 101*    Chemistries   Recent Labs Lab 10/12/15 1405 10/12/15 1409  10/14/15 0510  NA  --  141  < >  141  K  --  3.2*  < > 3.9  CL  --  109  < > 116*  CO2  --  27  < > 21*  GLUCOSE  --  98  < > 84  BUN  --  6  < > <5*  CREATININE  --  0.56  < > 0.66  CALCIUM  --  8.5*  < > 8.1*  MG 1.9  --   --   --   AST  --  14*  --   --   ALT  --  13*  --   --   ALKPHOS  --  60  --   --   BILITOT  --  0.2*  --   --   < > = values in this interval not displayed.  Cardiac Enzymes No results for input(s): TROPONINI in the last 168 hours.  Microbiology Results  Results for orders placed or performed during the hospital encounter of 10/12/15  C difficile quick scan w PCR reflex     Status: None   Collection Time: 10/12/15  8:05 PM  Result Value Ref Range Status   C Diff antigen NEGATIVE NEGATIVE Final   C Diff toxin NEGATIVE NEGATIVE Final   C Diff interpretation Negative for C. difficile  Final  Gastrointestinal Panel by PCR , Stool     Status: None   Collection Time: 10/12/15  8:05 PM  Result Value Ref Range Status   Campylobacter species NOT DETECTED NOT DETECTED Final   Plesimonas shigelloides NOT DETECTED NOT DETECTED Final   Salmonella species NOT DETECTED NOT DETECTED Final   Yersinia enterocolitica NOT DETECTED NOT DETECTED Final   Vibrio species NOT DETECTED NOT DETECTED Final   Vibrio cholerae NOT DETECTED NOT DETECTED Final   Enteroaggregative E coli (EAEC) NOT DETECTED NOT DETECTED Final   Enteropathogenic E coli (EPEC) NOT DETECTED NOT DETECTED Final   Enterotoxigenic E coli (ETEC) NOT DETECTED NOT DETECTED Final   Shiga like toxin producing E coli (STEC) NOT DETECTED NOT DETECTED Final   E. coli O157 NOT DETECTED NOT DETECTED Final   Shigella/Enteroinvasive E coli (EIEC) NOT DETECTED NOT DETECTED Final   Cryptosporidium NOT DETECTED NOT DETECTED Final   Cyclospora cayetanensis NOT DETECTED NOT DETECTED Final   Entamoeba histolytica NOT DETECTED NOT DETECTED Final   Giardia lamblia NOT DETECTED NOT DETECTED Final   Adenovirus F40/41 NOT DETECTED NOT DETECTED Final    Astrovirus NOT DETECTED NOT DETECTED Final   Norovirus GI/GII NOT DETECTED NOT DETECTED Final   Rotavirus A NOT DETECTED NOT DETECTED Final   Sapovirus (I, II, IV, and V) NOT DETECTED NOT DETECTED Final    RADIOLOGY:  No results found.  EKG:  No orders found for this or any previous visit.    Management plans discussed with the patient, family and they are in agreement.  CODE STATUS:     Code Status Orders        Start     Ordered   10/12/15 1803  Full code  Continuous     10/12/15 1802    Code Status History    Date Active Date Inactive Code Status Order ID Comments User Context   08/09/2015 11:53 AM 08/14/2015  8:15 PM Full Code [id number]  Florene Glen, MD Inpatient      TOTAL TIME TAKING CARE OF THIS PATIENT: 45  minutes.    @[facility] @  on 10/14/2015 at 2:28 PM  Between 7am to 6pm - Pager - [phone]  After 6pm go to [email or web address] - password [email or web address] [place] [facility]  Office  [phone]  CC: Primary care physician; Coral Spikes, DO

## 2015-10-14 NOTE — Anesthesia Postprocedure Evaluation (Signed)
Anesthesia Post Note  Patient: Nancy Clark  Procedure(s) Performed: Procedure(s) (LRB): COLONOSCOPY WITH PROPOFOL (N/A)  Patient location during evaluation: Endoscopy Anesthesia Type: General Level of consciousness: awake and alert Pain management: pain level controlled Vital Signs Assessment: post-procedure vital signs reviewed and stable Respiratory status: spontaneous breathing, nonlabored ventilation, respiratory function stable and patient connected to nasal cannula oxygen Cardiovascular status: blood pressure returned to baseline and stable Postop Assessment: no signs of nausea or vomiting Anesthetic complications: no    Last Vitals:  Filed Vitals:   10/14/15 0940 10/14/15 0950  BP: 145/75 149/79  Pulse: 73 60  Temp:    Resp: 19 18    Last Pain:  Filed Vitals:   10/14/15 0952  PainSc: 0-No pain                 Precious Haws Piscitello

## 2015-10-15 ENCOUNTER — Telehealth: Payer: Self-pay

## 2015-10-15 ENCOUNTER — Encounter: Payer: Self-pay | Admitting: Gastroenterology

## 2015-10-15 LAB — SURGICAL PATHOLOGY

## 2015-10-15 NOTE — Telephone Encounter (Addendum)
Transition Care Management Follow-up Telephone Call   Date discharged? 10/14/15   How have you been since you were released from the hospital? I feel good.  No signs of bleeding.  No pain.  No N/V/D.  Eating/drinking without issues.   Do you understand why you were in the hospital? YES, I had blood in my stool.   Do you understand the discharge instructions? YES, to restart my iron medication, drink plenty of water and increase activity as tolerated.  Where were you discharged to? HOME  Items Reviewed:  Medications reviewed: YES, started taking Tylenol 325mg , ENSURE LIQD, Ferrous Sulfate325mg  without issues.  Continuing all other medications as scheduled.  Allergies reviewed: YES, No known allergies.  Dietary changes reviewed: YES, fiber rich diet while continuing Ensure twice a day.  Referrals reviewed: YES, appointments scheduled with Oncology and PCP. Following up with Gastro in 1 month.   Functional Questionnaire:   Activities of Daily Living (ADLs):   She states they are independent in the following: Independent in all ADLs. States they require assistance with the following: Does not require assistance at this time.   Any transportation issues/concerns?: NO.   Any patient concerns? NO.    Confirmed importance and date/time of follow-up visits scheduled YES, appointment scheduled 10/19/15 at 3:00.  Provider Appointment booked with Dr. Lacinda Axon (PCP).    Confirmed with patient if condition begins to worsen call PCP or go to the ER.  Patient was given the office number and encouraged to call back with question or concerns.  : YES, patient verbalized understanding.

## 2015-10-19 ENCOUNTER — Encounter: Payer: Self-pay | Admitting: Family Medicine

## 2015-10-19 ENCOUNTER — Ambulatory Visit (INDEPENDENT_AMBULATORY_CARE_PROVIDER_SITE_OTHER): Payer: 59 | Admitting: Family Medicine

## 2015-10-19 VITALS — BP 138/82 | HR 100 | Temp 98.6°F | Wt 100.8 lb

## 2015-10-19 DIAGNOSIS — C182 Malignant neoplasm of ascending colon: Secondary | ICD-10-CM

## 2015-10-19 DIAGNOSIS — E440 Moderate protein-calorie malnutrition: Secondary | ICD-10-CM

## 2015-10-19 DIAGNOSIS — Z5189 Encounter for other specified aftercare: Secondary | ICD-10-CM

## 2015-10-19 DIAGNOSIS — K625 Hemorrhage of anus and rectum: Secondary | ICD-10-CM | POA: Diagnosis not present

## 2015-10-19 NOTE — Patient Instructions (Addendum)
Continue your current medications.  Follow up in 3-6 months.  Take care  Dr. Sid Falcon of luck with treatment!

## 2015-10-19 NOTE — Progress Notes (Signed)
Pre visit review using our clinic review tool, if applicable. No additional management support is needed unless otherwise documented below in the visit note. 

## 2015-10-20 ENCOUNTER — Inpatient Hospital Stay (HOSPITAL_BASED_OUTPATIENT_CLINIC_OR_DEPARTMENT_OTHER): Payer: 59 | Admitting: Internal Medicine

## 2015-10-20 ENCOUNTER — Inpatient Hospital Stay: Payer: 59

## 2015-10-20 VITALS — BP 165/80 | HR 65 | Temp 97.7°F | Resp 18

## 2015-10-20 DIAGNOSIS — T451X5S Adverse effect of antineoplastic and immunosuppressive drugs, sequela: Secondary | ICD-10-CM

## 2015-10-20 DIAGNOSIS — C182 Malignant neoplasm of ascending colon: Secondary | ICD-10-CM | POA: Diagnosis not present

## 2015-10-20 DIAGNOSIS — I10 Essential (primary) hypertension: Secondary | ICD-10-CM | POA: Diagnosis not present

## 2015-10-20 DIAGNOSIS — Z87891 Personal history of nicotine dependence: Secondary | ICD-10-CM

## 2015-10-20 DIAGNOSIS — R42 Dizziness and giddiness: Secondary | ICD-10-CM | POA: Diagnosis not present

## 2015-10-20 DIAGNOSIS — Z5112 Encounter for antineoplastic immunotherapy: Secondary | ICD-10-CM | POA: Diagnosis not present

## 2015-10-20 DIAGNOSIS — K649 Unspecified hemorrhoids: Secondary | ICD-10-CM | POA: Diagnosis not present

## 2015-10-20 DIAGNOSIS — E876 Hypokalemia: Secondary | ICD-10-CM | POA: Diagnosis not present

## 2015-10-20 DIAGNOSIS — Z9049 Acquired absence of other specified parts of digestive tract: Secondary | ICD-10-CM | POA: Diagnosis not present

## 2015-10-20 DIAGNOSIS — Z5111 Encounter for antineoplastic chemotherapy: Secondary | ICD-10-CM | POA: Diagnosis not present

## 2015-10-20 DIAGNOSIS — Z79899 Other long term (current) drug therapy: Secondary | ICD-10-CM | POA: Diagnosis not present

## 2015-10-20 DIAGNOSIS — C778 Secondary and unspecified malignant neoplasm of lymph nodes of multiple regions: Secondary | ICD-10-CM | POA: Diagnosis not present

## 2015-10-20 DIAGNOSIS — D701 Agranulocytosis secondary to cancer chemotherapy: Secondary | ICD-10-CM | POA: Diagnosis not present

## 2015-10-20 DIAGNOSIS — R5383 Other fatigue: Secondary | ICD-10-CM | POA: Diagnosis not present

## 2015-10-20 DIAGNOSIS — Z800 Family history of malignant neoplasm of digestive organs: Secondary | ICD-10-CM | POA: Diagnosis not present

## 2015-10-20 DIAGNOSIS — Z8042 Family history of malignant neoplasm of prostate: Secondary | ICD-10-CM | POA: Diagnosis not present

## 2015-10-20 LAB — COMPREHENSIVE METABOLIC PANEL
ALBUMIN: 2.9 g/dL — AB (ref 3.5–5.0)
ALK PHOS: 62 U/L (ref 38–126)
ALT: 15 U/L (ref 14–54)
ANION GAP: 8 (ref 5–15)
AST: 20 U/L (ref 15–41)
BILIRUBIN TOTAL: 0.3 mg/dL (ref 0.3–1.2)
BUN: 6 mg/dL (ref 6–20)
CALCIUM: 8.3 mg/dL — AB (ref 8.9–10.3)
CO2: 23 mmol/L (ref 22–32)
Chloride: 110 mmol/L (ref 101–111)
Creatinine, Ser: 0.6 mg/dL (ref 0.44–1.00)
GFR calc Af Amer: >60 mL/min (ref >60)
GLUCOSE: 121 mg/dL — AB (ref 65–99)
Potassium: 2.8 mmol/L — CL (ref 3.5–5.1)
Sodium: 141 mmol/L (ref 135–145)
TOTAL PROTEIN: 5.6 g/dL — AB (ref 6.5–8.1)

## 2015-10-20 LAB — CBC WITH DIFFERENTIAL/PLATELET
BASOS PCT: 1 %
Basophils Absolute: 0 10*3/uL (ref 0–0.1)
Eosinophils Absolute: 0 10*3/uL (ref 0–0.7)
Eosinophils Relative: 1 %
HEMATOCRIT: 30.5 % — AB (ref 35.0–47.0)
HEMOGLOBIN: 10.2 g/dL — AB (ref 12.0–16.0)
LYMPHS ABS: 1.1 10*3/uL (ref 1.0–3.6)
LYMPHS PCT: 54 %
MCH: 28.2 pg (ref 26.0–34.0)
MCHC: 33.5 g/dL (ref 32.0–36.0)
MCV: 84.1 fL (ref 80.0–100.0)
MONO ABS: 0.5 10*3/uL (ref 0.2–0.9)
MONOS PCT: 23 %
NEUTROS ABS: 0.4 10*3/uL — AB (ref 1.7–7.7)
NEUTROS PCT: 21 %
Platelets: 180 10*3/uL (ref 150–440)
RBC: 3.63 MIL/uL — ABNORMAL LOW (ref 3.80–5.20)
RDW: 18.3 % — ABNORMAL HIGH (ref 11.5–14.5)
WBC: 2.1 10*3/uL — ABNORMAL LOW (ref 3.6–11.0)

## 2015-10-20 MED ORDER — SODIUM CHLORIDE 0.9% FLUSH
10.0000 mL | INTRAVENOUS | Status: DC | PRN
  Administered 2015-10-20: 10 mL via INTRAVENOUS
  Filled 2015-10-20: qty 10

## 2015-10-20 MED ORDER — POTASSIUM CHLORIDE CRYS ER 20 MEQ PO TBCR: 40.0000 meq | EXTENDED_RELEASE_TABLET | Freq: Two times a day (BID) | ORAL | Status: DC

## 2015-10-20 MED ORDER — HEPARIN SOD (PORK) LOCK FLUSH 100 UNIT/ML IV SOLN
500.0000 [IU] | Freq: Once | INTRAVENOUS | Status: AC
  Administered 2015-10-20: 500 [IU] via INTRAVENOUS
  Filled 2015-10-20: qty 5

## 2015-10-20 NOTE — Assessment & Plan Note (Signed)
Discussed increasing protein intake.

## 2015-10-20 NOTE — Progress Notes (Signed)
Pt doing good, no issues. Eating good. She has stools that are loose forms but not diarrhea about 3 a day. No pain.

## 2015-10-20 NOTE — Progress Notes (Signed)
Subjective:  Patient ID: Nancy Clark, female    DOB: [birth year]  Age: 55 y.o. MRN: [id number]  CC: Hospital follow up  HPI:  55 year old female with colon cancer (Stage IV) currently receiving treatment presents for hospital follow-up.  Patient was admitted from 5/23 - 5/25. Hospital course and discharge summary were reviewed and are summarized as below.  Patient presented with three-day history of rectal bleeding. She denies any other associated symptoms. Hemoglobin was found to be down to 9.9 from 11.4. Additionally she was found to be pancytopenic from chemotherapy. She was then admitted for further evaluation. GI was consulted and colonoscopy was performed. It revealed external hemorrhoids. No other abnormalities. Hemoglobin remained stable. Following colonoscopy she was discharged home in stable condition. She was instructed to follow-up as an outpatient.  Patient presents today for follow-up. She states that she is feeling well currently. She states that her appetite is slowly improving. She's had no further rectal bleeding. No recent abdominal pain. No other complaints at this time. She has an upcoming appointment for chemotherapy.  Social Hx   Social History   Social History  . Marital Status: Married    Spouse Name: N/A  . Number of Children: N/A  . Years of Education: N/A   Social History Main Topics  . Smoking status: Former Smoker -- 0.25 packs/day for 2 years  . Smokeless tobacco: Never Used     Comment: recently quit  . Alcohol Use: No  . Drug Use: No  . Sexual Activity: Yes   Other Topics Concern  . None   Social History Narrative   Review of Systems  Constitutional: Negative for fever and chills.       Appetite improving.  Gastrointestinal: Negative.    Objective:  BP 138/82 mmHg  Pulse 100  Temp(Src) 98.6 F (37 C) (Oral)  Wt 100 lb 12 oz (45.7 kg)  SpO2 98%  LMP  (LMP Unknown)  BP/Weight 10/20/2015 10/19/2015 [id number]  Systolic BP [id number]  [id number]  Diastolic BP 80 82 66  Wt. (Lbs) - 100.75 -  BMI - 19.68 -   Physical Exam  Constitutional: She is oriented to person, place, and time. She appears well-developed. No distress.  Cardiovascular: Normal rate and regular rhythm.   Pulmonary/Chest: Effort normal and breath sounds normal.  Abdominal: Soft. She exhibits no distension. There is no tenderness. There is no rebound and no guarding.  Neurological: She is alert and oriented to person, place, and time.  Psychiatric: She has a normal mood and affect.  Vitals reviewed.  Lab Results  Component Value Date   WBC 2.1* 10/20/2015   HGB 10.2* 10/20/2015   HCT 30.5* 10/20/2015   PLT 180 10/20/2015   GLUCOSE 121* 10/20/2015   CHOL 150 06/08/2015   TRIG 110.0 06/08/2015   HDL 48.90 06/08/2015   LDLCALC 79 06/08/2015   ALT 15 10/20/2015   AST 20 10/20/2015   NA 141 10/20/2015   K 2.8* 10/20/2015   CL 110 10/20/2015   CREATININE 0.60 10/20/2015   BUN 6 10/20/2015   CO2 23 10/20/2015   TSH 0.60 06/08/2015    Problem List Items Addressed This Visit    Malnutrition of moderate degree    Discussed increasing protein intake.      Gastrointestinal bleeding - Primary    New problem. Hospital course reviewed and summarized in HPI today. Stable at this time. No further bleeding. She has upcoming follow-up regarding her chemotherapy.  Follow-up: PRN  [place]

## 2015-10-20 NOTE — Assessment & Plan Note (Signed)
New problem. Hospital course reviewed and summarized in HPI today. Stable at this time. No further bleeding. She has upcoming follow-up regarding her chemotherapy.

## 2015-10-20 NOTE — Progress Notes (Signed)
[place] NOTE  Patient Care Team: Coral Spikes, DO as PCP - General (Family Medicine)  CHIEF COMPLAINTS/PURPOSE OF CONSULTATION:   # [facility] 2017- COLON CANCER- STAGE IV [s/p right hemi-colectomy; pT4apN2 (7/19LN)]; Pre-op CEA- [id number]  PET-  mesenteric adenopathy/mediastinal adenopathy/right-sided subclavicular lymph node.  K-ras-MUTATED;  April 17th -START FOLFOX; May 1st Add Avastin  # MSI- STABLE.   HISTORY OF PRESENTING ILLNESS:  Nancy Clark 55 y.o.  female  with a newly diagnosed right-sided colon cancer status post right hemicolectomy is here Status post cycle #3 of FOLFOX + Avastin.   Few days after last chemotherapy patient was admitted to the hospital for bright red blood per rectum. Patient had a colonoscopy that showed no major abnormalities except for hemorrhoids. Since, the discharge from hospital patient has not had any more bleeding.  Patient denies any nausea vomiting. Denies any significant diarrhea. Denies any significant tingling and numbness.  No constipation. No fever no chills.  ROS: A complete 10 point review of system is done which is negative except mentioned above in history of present illness  MEDICAL HISTORY:  Past Medical History  Diagnosis Date  . Chicken pox   . Colon cancer [facility])     SURGICAL HISTORY: Past Surgical History  Procedure Laterality Date  . Shoulder surgery Left   . Ectopic pregnancy surgery    . Colonoscopy    . Breast biopsy Right     2007? pt unsure done by [facility]  . Colostomy revision Right 08/09/2015    Procedure: COLON RESECTION RIGHT;  Surgeon: Florene Glen, MD;  Location: [facility] ORS;  Service: General;  Laterality: Right;  . Portacath placement N/A 09/01/2015    Procedure: INSERTION PORT-A-CATH;  Surgeon: Florene Glen, MD;  Location: [facility] ORS;  Service: General;  Laterality: N/A;  . 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  1 pack in 3 days; no alcohol. 2 childern [25 & 17 years] Social History   Social History  . Marital Status: Married    Spouse Name: N/A  . Number of Children: N/A  . Years of Education: N/A   Occupational History  . Not on file.   Social History Main Topics  . Smoking status: Former Smoker -- 0.25 packs/day for 2 years  . Smokeless tobacco: Never Used     Comment: recently quit  . Alcohol Use: No  . Drug Use: No  . Sexual Activity: Yes   Other Topics Concern  . Not on file   Social History Narrative    FAMILY HISTORY: 1 brother& 1 sister- no cancer. Mom-no cancer Family History  Problem Relation Age of Onset  . Hypertension Mother   . Arthritis Father   . Prostate cancer Father   . Diabetes Maternal Grandmother   . Colon cancer Maternal Grandfather     colon    ALLERGIES:  has No Known Allergies.  MEDICATIONS:  Current Outpatient Prescriptions  Medication Sig Dispense Refill  . acetaminophen (TYLENOL) 325 MG tablet Take 2 tablets (650 mg total) by mouth every 6 (six) hours as needed for mild pain (or Fever >/= 101).    . feeding supplement, ENSURE ENLIVE, (ENSURE ENLIVE) LIQD Take 237 mLs by mouth 2 (two) times daily between meals. 60 Bottle 3  . ferrous sulfate (FERROUSUL) 325 (65  FE) MG tablet Take 1 tablet (325 mg total) by mouth daily with breakfast. 90 tablet 3  . HYDROcodone-acetaminophen (NORCO/VICODIN) 5-325 MG tablet Take 1 tablet by mouth every 6 (six) hours as needed for moderate pain or severe pain. 25 tablet 0  . lidocaine-prilocaine (EMLA) cream Apply 1 application topically as needed. Apply generously over the Mediport 45 minutes prior to chemotherapy. 30 g 0  . Multiple Vitamins-Minerals (MULTIVITAMIN ADULT PO) Take 1 tablet by mouth daily.    . ondansetron (ZOFRAN) 8 MG tablet Take 1 tablet (8 mg total) by mouth every 8 (eight)  hours as needed for nausea or vomiting (start 3 days; after chemo). 40 tablet 0  . prochlorperazine (COMPAZINE) 10 MG tablet Take 1 tablet (10 mg total) by mouth every 6 (six) hours as needed for nausea or vomiting. 30 tablet 0  . potassium chloride SA (K-DUR,KLOR-CON) 20 MEQ tablet Take 2 tablets (40 mEq total) by mouth 2 (two) times daily. 30 tablet 3   No current facility-administered medications for this visit.   Facility-Administered Medications Ordered in Other Visits  Medication Dose Route Frequency Provider Last Rate Last Dose  . sodium chloride flush (NS) 0.9 % injection 10 mL  10 mL Intravenous PRN Cammie Sickle, MD   10 mL at 10/04/15 0901      .  PHYSICAL EXAMINATION: ECOG PERFORMANCE STATUS: 0 - Asymptomatic  Filed Vitals:   10/20/15 0955 10/20/15 1000  BP: 161/86 165/80  Pulse: 65   Temp: 97.7 F (36.5 C)   Resp: 18    There were no vitals filed for this visit.  GENERAL: Well-nourished well-developed; thin built; Alert, no distress and comfortable.   Accompanied by her  Sister.  EYES: no pallor or icterus OROPHARYNX: no thrush or ulceration; good dentition  NECK: supple, no masses felt LYMPH:  no palpable lymphadenopathy in the cervical, axillary or inguinal regions LUNGS: clear to auscultation and  No wheeze or crackles HEART/CVS: regular rate & rhythm and no murmurs; No lower extremity edema ABDOMEN: abdomen soft, non-tender and normal bowel sounds; surgical incisions noted. No infections. Musculoskeletal:no cyanosis of digits and no clubbing  PSYCH: alert & oriented x 3 with fluent speech NEURO: no focal motor/sensory deficits SKIN:  no rashes or significant lesions  LABORATORY DATA:  I have reviewed the data as listed Lab Results  Component Value Date   WBC 2.1* 10/20/2015   HGB 10.2* 10/20/2015   HCT 30.5* 10/20/2015   MCV 84.1 10/20/2015   PLT 180 10/20/2015    Recent Labs  10/04/15 0901 10/12/15 1409 10/13/15 0500 10/14/15 0510  10/20/15 0848  NA 136 141 142 141 141  K 3.4* 3.2* 3.3* 3.9 2.8*  CL 103 109 113* 116* 110  CO2 _0 21* 23  GLUCOSE 93 98 81 84 121*  BUN 11 6 <5* <5* 6  CREATININE 0.70 0.56 0.60 0.66 0.60  CALCIUM 8.7* 8.5* 8.2* 8.1* 8.3*  GFRNONAA >60 >60 >60 >60 >60  GFRAA >60 >60 >60 >60 >60  PROT 6.6 5.9*  --   --  5.6*  ALBUMIN 3.1* 2.9*  --   --  2.9*  AST 17 14*  --   --  20  ALT 12* 13*  --   --  15  ALKPHOS 79 60  --   --  62  BILITOT 0.3 0.2*  --   --  0.3     ASSESSMENT & PLAN:   # Colon cancer- cecal/ right-sided; STAGE IV [  intraabdominal/retroperitonel LN/Supracla; K-ras mutated].. Currently on FOLFOX palliative chemotherapy. I again reviewed with the patient that treatments are palliative- and we'll plan to have a CT scan after 6 cycles.  # Neutropenia Drug-induced chemotherapy ANC 400. Growth factor-Neulasta/On pro would be given as prophylaxis for chemotherapy-induced neutropenia to prevent febrile neutropenias on day #3 of chemotherapy starting with next cycle.Recommend Claritin.    # Hypokalemia- from chemotherapy/ mild diarrhea. Potassium 40 mEq twice a day.   # rectal bleed sec to hemorroids- colonoscopy negative. Currently no more bleeding.  # nutrition- recommend dose to ensure 2-3 day.  # With a blood pressure is systolic 546F- recommend bringing a log of blood pressures. Patient had poor tolerance to lisinopril/" thiazide.  # FOLFOX plus Avastin next week; CBC BMP. Follow-up with me in 3 weeks FOLFOX plus Avastin...    Cammie Sickle, MD 10/20/2015 6:16 PM

## 2015-10-22 ENCOUNTER — Other Ambulatory Visit: Payer: Self-pay | Admitting: *Deleted

## 2015-10-27 ENCOUNTER — Inpatient Hospital Stay: Payer: 59

## 2015-10-27 ENCOUNTER — Inpatient Hospital Stay: Payer: 59 | Attending: Internal Medicine

## 2015-10-27 ENCOUNTER — Other Ambulatory Visit: Payer: Self-pay | Admitting: Internal Medicine

## 2015-10-27 VITALS — BP 136/75 | HR 71 | Temp 96.2°F | Resp 18

## 2015-10-27 DIAGNOSIS — C778 Secondary and unspecified malignant neoplasm of lymph nodes of multiple regions: Secondary | ICD-10-CM | POA: Diagnosis not present

## 2015-10-27 DIAGNOSIS — Z5112 Encounter for antineoplastic immunotherapy: Secondary | ICD-10-CM | POA: Insufficient documentation

## 2015-10-27 DIAGNOSIS — Z87891 Personal history of nicotine dependence: Secondary | ICD-10-CM | POA: Insufficient documentation

## 2015-10-27 DIAGNOSIS — D72829 Elevated white blood cell count, unspecified: Secondary | ICD-10-CM | POA: Diagnosis not present

## 2015-10-27 DIAGNOSIS — T451X5S Adverse effect of antineoplastic and immunosuppressive drugs, sequela: Secondary | ICD-10-CM | POA: Diagnosis not present

## 2015-10-27 DIAGNOSIS — E876 Hypokalemia: Secondary | ICD-10-CM

## 2015-10-27 DIAGNOSIS — Z9049 Acquired absence of other specified parts of digestive tract: Secondary | ICD-10-CM | POA: Diagnosis not present

## 2015-10-27 DIAGNOSIS — Z5111 Encounter for antineoplastic chemotherapy: Secondary | ICD-10-CM | POA: Diagnosis not present

## 2015-10-27 DIAGNOSIS — Z800 Family history of malignant neoplasm of digestive organs: Secondary | ICD-10-CM | POA: Diagnosis not present

## 2015-10-27 DIAGNOSIS — D701 Agranulocytosis secondary to cancer chemotherapy: Secondary | ICD-10-CM | POA: Insufficient documentation

## 2015-10-27 DIAGNOSIS — R197 Diarrhea, unspecified: Secondary | ICD-10-CM | POA: Diagnosis not present

## 2015-10-27 DIAGNOSIS — C182 Malignant neoplasm of ascending colon: Secondary | ICD-10-CM | POA: Diagnosis not present

## 2015-10-27 DIAGNOSIS — Z8042 Family history of malignant neoplasm of prostate: Secondary | ICD-10-CM | POA: Insufficient documentation

## 2015-10-27 DIAGNOSIS — C772 Secondary and unspecified malignant neoplasm of intra-abdominal lymph nodes: Secondary | ICD-10-CM

## 2015-10-27 DIAGNOSIS — M791 Myalgia: Secondary | ICD-10-CM | POA: Diagnosis not present

## 2015-10-27 DIAGNOSIS — Z79899 Other long term (current) drug therapy: Secondary | ICD-10-CM | POA: Diagnosis not present

## 2015-10-27 LAB — CBC WITH DIFFERENTIAL/PLATELET
Basophils Absolute: 0.1 10*3/uL (ref 0–0.1)
Basophils Relative: 1 %
Eosinophils Absolute: 0 10*3/uL (ref 0–0.7)
Eosinophils Relative: 0 %
HEMATOCRIT: 36 % (ref 35.0–47.0)
HEMOGLOBIN: 12 g/dL (ref 12.0–16.0)
LYMPHS ABS: 1.8 10*3/uL (ref 1.0–3.6)
LYMPHS PCT: 28 %
MCH: 28.7 pg (ref 26.0–34.0)
MCHC: 33.2 g/dL (ref 32.0–36.0)
MCV: 86.6 fL (ref 80.0–100.0)
MONOS PCT: 14 %
Monocytes Absolute: 0.9 10*3/uL (ref 0.2–0.9)
NEUTROS ABS: 3.6 10*3/uL (ref 1.4–6.5)
NEUTROS PCT: 57 %
PLATELETS: 233 10*3/uL (ref 150–440)
RBC: 4.16 MIL/uL (ref 3.80–5.20)
RDW: 22.5 % — ABNORMAL HIGH (ref 11.5–14.5)
WBC: 6.4 10*3/uL (ref 3.6–11.0)

## 2015-10-27 LAB — BASIC METABOLIC PANEL
Anion gap: 5 (ref 5–15)
BUN: 9 mg/dL (ref 6–20)
CALCIUM: 9 mg/dL (ref 8.9–10.3)
CO2: 26 mmol/L (ref 22–32)
Chloride: 107 mmol/L (ref 101–111)
Creatinine, Ser: 0.76 mg/dL (ref 0.44–1.00)
GFR calc Af Amer: >60 mL/min (ref >60)
GLUCOSE: 87 mg/dL (ref 65–99)
Potassium: 4.6 mmol/L (ref 3.5–5.1)
Sodium: 138 mmol/L (ref 135–145)

## 2015-10-27 MED ORDER — FLUOROURACIL CHEMO INJECTION 2.5 GM/50ML
400.0000 mg/m2 | Freq: Once | INTRAVENOUS | Status: AC
  Administered 2015-10-27: 550 mg via INTRAVENOUS
  Filled 2015-10-27: qty 11

## 2015-10-27 MED ORDER — LEUCOVORIN CALCIUM INJECTION 350 MG
550.0000 mg | Freq: Once | INTRAMUSCULAR | Status: AC
  Administered 2015-10-27: 550 mg via INTRAVENOUS
  Filled 2015-10-27: qty 27.5

## 2015-10-27 MED ORDER — BEVACIZUMAB CHEMO INJECTION 400 MG/16ML
5.0000 mg/kg | Freq: Once | INTRAVENOUS | Status: AC
  Administered 2015-10-27: 225 mg via INTRAVENOUS
  Filled 2015-10-27: qty 9

## 2015-10-27 MED ORDER — SODIUM CHLORIDE 0.9 % IV SOLN
10.0000 mg | Freq: Once | INTRAVENOUS | Status: AC
  Administered 2015-10-27: 10 mg via INTRAVENOUS
  Filled 2015-10-27: qty 1

## 2015-10-27 MED ORDER — OXALIPLATIN CHEMO INJECTION 100 MG/20ML
85.0000 mg/m2 | Freq: Once | INTRAVENOUS | Status: AC
  Administered 2015-10-27: 120 mg via INTRAVENOUS
  Filled 2015-10-27: qty 24

## 2015-10-27 MED ORDER — DEXTROSE 5 % IV SOLN
Freq: Once | INTRAVENOUS | Status: AC
  Administered 2015-10-27: 11:00:00 via INTRAVENOUS
  Filled 2015-10-27: qty 1000

## 2015-10-27 MED ORDER — SODIUM CHLORIDE 0.9 % IV SOLN
2400.0000 mg/m2 | INTRAVENOUS | Status: DC
  Administered 2015-10-27: 3350 mg via INTRAVENOUS
  Filled 2015-10-27: qty 67

## 2015-10-27 MED ORDER — HEPARIN SOD (PORK) LOCK FLUSH 100 UNIT/ML IV SOLN: 500.0000 [IU] | Freq: Once | INTRAVENOUS | Status: DC

## 2015-10-27 MED ORDER — SODIUM CHLORIDE 0.9% FLUSH
10.0000 mL | INTRAVENOUS | Status: DC | PRN
  Administered 2015-10-27: 10 mL via INTRAVENOUS
  Filled 2015-10-27: qty 10

## 2015-10-27 MED ORDER — PALONOSETRON HCL INJECTION 0.25 MG/5ML
0.2500 mg | Freq: Once | INTRAVENOUS | Status: AC
  Administered 2015-10-27: 0.25 mg via INTRAVENOUS
  Filled 2015-10-27: qty 5

## 2015-10-27 MED ORDER — SODIUM CHLORIDE 0.9 % IV SOLN
Freq: Once | INTRAVENOUS | Status: AC
  Administered 2015-10-27: 10:00:00 via INTRAVENOUS
  Filled 2015-10-27: qty 1000

## 2015-10-27 NOTE — Progress Notes (Signed)
After consultation with Dr. Rogue Bussing, I met with patient and her mother [name] in the infusion suite today. I introduced research study NSABP (National Surgical Adjuvant Breast and Bowel Project) MPR-1:  The NSABP Patient Registry and Nurse, mental health.  I explained to patient and her mother that the study requires submission of 1 block of tumor tissue to NSABP Bio repository and requests 1 block of non tumor tissue.  The tissue would be taken from samples from her colon resection surgery on 08/09/2015.  The tissue would be sent to NSABP for additional genomic testing.  Patient or cancer center staff will not be privy to results unless these results reveal that patient is eligible for an additional research study. Patient will most likely have no direct benefit from participating in the study.  It was explained that participation in research is optional and her care would not be effected negatively if she declines.  Patient and her mother have taken a copy of the consent form home for review. I have also given them my business card.  I will follow up with patient during one of her upcoming visits to the cancer center.  I have encouraged her to call me or speak to Dr. Rogue Bussing if she has any questions before her return to the cancer center.

## 2015-10-29 ENCOUNTER — Inpatient Hospital Stay: Payer: 59

## 2015-10-29 DIAGNOSIS — C182 Malignant neoplasm of ascending colon: Secondary | ICD-10-CM | POA: Diagnosis not present

## 2015-10-29 DIAGNOSIS — Z9049 Acquired absence of other specified parts of digestive tract: Secondary | ICD-10-CM | POA: Diagnosis not present

## 2015-10-29 DIAGNOSIS — C778 Secondary and unspecified malignant neoplasm of lymph nodes of multiple regions: Secondary | ICD-10-CM | POA: Diagnosis not present

## 2015-10-29 DIAGNOSIS — T451X5S Adverse effect of antineoplastic and immunosuppressive drugs, sequela: Secondary | ICD-10-CM | POA: Diagnosis not present

## 2015-10-29 DIAGNOSIS — C772 Secondary and unspecified malignant neoplasm of intra-abdominal lymph nodes: Principal | ICD-10-CM

## 2015-10-29 DIAGNOSIS — Z5111 Encounter for antineoplastic chemotherapy: Secondary | ICD-10-CM | POA: Diagnosis not present

## 2015-10-29 DIAGNOSIS — D72829 Elevated white blood cell count, unspecified: Secondary | ICD-10-CM | POA: Diagnosis not present

## 2015-10-29 DIAGNOSIS — E876 Hypokalemia: Secondary | ICD-10-CM | POA: Diagnosis not present

## 2015-10-29 DIAGNOSIS — Z5112 Encounter for antineoplastic immunotherapy: Secondary | ICD-10-CM | POA: Diagnosis not present

## 2015-10-29 DIAGNOSIS — D701 Agranulocytosis secondary to cancer chemotherapy: Secondary | ICD-10-CM | POA: Diagnosis not present

## 2015-10-29 MED ORDER — PEGFILGRASTIM 6 MG/0.6ML ~~LOC~~ PSKT
6.0000 mg | PREFILLED_SYRINGE | Freq: Once | SUBCUTANEOUS | Status: AC
  Administered 2015-10-29: 6 mg via SUBCUTANEOUS
  Filled 2015-10-29: qty 0.6

## 2015-10-29 MED ORDER — SODIUM CHLORIDE 0.9% FLUSH
10.0000 mL | INTRAVENOUS | Status: DC | PRN
  Administered 2015-10-29: 10 mL
  Filled 2015-10-29: qty 10

## 2015-10-29 MED ORDER — HEPARIN SOD (PORK) LOCK FLUSH 100 UNIT/ML IV SOLN
500.0000 [IU] | Freq: Once | INTRAVENOUS | Status: AC | PRN
  Administered 2015-10-29: 500 [IU]
  Filled 2015-10-29: qty 5

## 2015-11-06 DIAGNOSIS — C182 Malignant neoplasm of ascending colon: Secondary | ICD-10-CM | POA: Diagnosis not present

## 2015-11-10 ENCOUNTER — Other Ambulatory Visit: Payer: Self-pay

## 2015-11-10 ENCOUNTER — Inpatient Hospital Stay: Payer: 59

## 2015-11-10 ENCOUNTER — Inpatient Hospital Stay (HOSPITAL_BASED_OUTPATIENT_CLINIC_OR_DEPARTMENT_OTHER): Payer: 59 | Admitting: Internal Medicine

## 2015-11-10 VITALS — BP 155/80 | HR 64 | Resp 20

## 2015-11-10 VITALS — BP 147/82 | HR 63 | Temp 95.7°F | Wt 99.4 lb

## 2015-11-10 DIAGNOSIS — Z5111 Encounter for antineoplastic chemotherapy: Secondary | ICD-10-CM | POA: Diagnosis not present

## 2015-11-10 DIAGNOSIS — C182 Malignant neoplasm of ascending colon: Secondary | ICD-10-CM

## 2015-11-10 DIAGNOSIS — C772 Secondary and unspecified malignant neoplasm of intra-abdominal lymph nodes: Secondary | ICD-10-CM

## 2015-11-10 DIAGNOSIS — E876 Hypokalemia: Secondary | ICD-10-CM

## 2015-11-10 DIAGNOSIS — D701 Agranulocytosis secondary to cancer chemotherapy: Secondary | ICD-10-CM

## 2015-11-10 DIAGNOSIS — M791 Myalgia: Secondary | ICD-10-CM

## 2015-11-10 DIAGNOSIS — Z5112 Encounter for antineoplastic immunotherapy: Secondary | ICD-10-CM | POA: Diagnosis not present

## 2015-11-10 DIAGNOSIS — Z8042 Family history of malignant neoplasm of prostate: Secondary | ICD-10-CM

## 2015-11-10 DIAGNOSIS — C778 Secondary and unspecified malignant neoplasm of lymph nodes of multiple regions: Secondary | ICD-10-CM | POA: Diagnosis not present

## 2015-11-10 DIAGNOSIS — Z79899 Other long term (current) drug therapy: Secondary | ICD-10-CM

## 2015-11-10 DIAGNOSIS — T451X5S Adverse effect of antineoplastic and immunosuppressive drugs, sequela: Secondary | ICD-10-CM

## 2015-11-10 DIAGNOSIS — R197 Diarrhea, unspecified: Secondary | ICD-10-CM

## 2015-11-10 DIAGNOSIS — D72829 Elevated white blood cell count, unspecified: Secondary | ICD-10-CM | POA: Diagnosis not present

## 2015-11-10 DIAGNOSIS — Z800 Family history of malignant neoplasm of digestive organs: Secondary | ICD-10-CM

## 2015-11-10 DIAGNOSIS — Z9049 Acquired absence of other specified parts of digestive tract: Secondary | ICD-10-CM | POA: Diagnosis not present

## 2015-11-10 DIAGNOSIS — Z87891 Personal history of nicotine dependence: Secondary | ICD-10-CM

## 2015-11-10 LAB — COMPREHENSIVE METABOLIC PANEL
ALT: 14 U/L (ref 14–54)
ANION GAP: 5 (ref 5–15)
AST: 17 U/L (ref 15–41)
Albumin: 3.5 g/dL (ref 3.5–5.0)
Alkaline Phosphatase: 118 U/L (ref 38–126)
BUN: 13 mg/dL (ref 6–20)
CHLORIDE: 113 mmol/L — AB (ref 101–111)
CO2: 22 mmol/L (ref 22–32)
Calcium: 8.8 mg/dL — ABNORMAL LOW (ref 8.9–10.3)
Creatinine, Ser: 0.65 mg/dL (ref 0.44–1.00)
GFR calc Af Amer: >60 mL/min (ref >60)
Glucose, Bld: 105 mg/dL — ABNORMAL HIGH (ref 65–99)
POTASSIUM: 3.5 mmol/L (ref 3.5–5.1)
Sodium: 140 mmol/L (ref 135–145)
Total Bilirubin: 0.3 mg/dL (ref 0.3–1.2)
Total Protein: 6.4 g/dL — ABNORMAL LOW (ref 6.5–8.1)

## 2015-11-10 LAB — CBC WITH DIFFERENTIAL/PLATELET
BASOS ABS: 0.1 10*3/uL (ref 0–0.1)
BASOS PCT: 0 %
EOS PCT: 1 %
Eosinophils Absolute: 0.1 10*3/uL (ref 0–0.7)
HCT: 35.1 % (ref 35.0–47.0)
Hemoglobin: 11.5 g/dL — ABNORMAL LOW (ref 12.0–16.0)
LYMPHS PCT: 12 %
Lymphs Abs: 2.4 10*3/uL (ref 1.0–3.6)
MCH: 28.7 pg (ref 26.0–34.0)
MCHC: 32.7 g/dL (ref 32.0–36.0)
MCV: 87.8 fL (ref 80.0–100.0)
MONO ABS: 1.1 10*3/uL — AB (ref 0.2–0.9)
Monocytes Relative: 6 %
Neutro Abs: 15.5 10*3/uL — ABNORMAL HIGH (ref 1.4–6.5)
Neutrophils Relative %: 81 %
PLATELETS: 90 10*3/uL — AB (ref 150–440)
RBC: 3.99 MIL/uL (ref 3.80–5.20)
RDW: 22.6 % — AB (ref 11.5–14.5)
WBC: 19.1 10*3/uL — ABNORMAL HIGH (ref 3.6–11.0)

## 2015-11-10 MED ORDER — SODIUM CHLORIDE 0.9 % IV SOLN
Freq: Once | INTRAVENOUS | Status: AC
  Administered 2015-11-10: 12:00:00 via INTRAVENOUS
  Filled 2015-11-10: qty 1000

## 2015-11-10 MED ORDER — PALONOSETRON HCL INJECTION 0.25 MG/5ML
0.2500 mg | Freq: Once | INTRAVENOUS | Status: AC
Start: 2015-11-10 — End: 2015-11-10
  Administered 2015-11-10: 0.25 mg via INTRAVENOUS
  Filled 2015-11-10: qty 5

## 2015-11-10 MED ORDER — DEXTROSE 5 % IV SOLN
Freq: Once | INTRAVENOUS | Status: AC
  Administered 2015-11-10: 12:00:00 via INTRAVENOUS
  Filled 2015-11-10: qty 1000

## 2015-11-10 MED ORDER — DEXTROSE 5 % IV SOLN
550.0000 mg | Freq: Once | INTRAVENOUS | Status: AC
Start: 2015-11-10 — End: 2015-11-10
  Administered 2015-11-10: 550 mg via INTRAVENOUS
  Filled 2015-11-10: qty 10

## 2015-11-10 MED ORDER — OXALIPLATIN CHEMO INJECTION 100 MG/20ML
85.0000 mg/m2 | Freq: Once | INTRAVENOUS | Status: AC
  Administered 2015-11-10: 120 mg via INTRAVENOUS
  Filled 2015-11-10: qty 20

## 2015-11-10 MED ORDER — FLUOROURACIL CHEMO INJECTION 2.5 GM/50ML
400.0000 mg/m2 | Freq: Once | INTRAVENOUS | Status: AC
  Administered 2015-11-10: 550 mg via INTRAVENOUS
  Filled 2015-11-10: qty 11

## 2015-11-10 MED ORDER — DEXAMETHASONE SODIUM PHOSPHATE 100 MG/10ML IJ SOLN
10.0000 mg | Freq: Once | INTRAMUSCULAR | Status: AC
  Administered 2015-11-10: 10 mg via INTRAVENOUS
  Filled 2015-11-10: qty 1

## 2015-11-10 MED ORDER — SODIUM CHLORIDE 0.9 % IV SOLN
5.0000 mg/kg | Freq: Once | INTRAVENOUS | Status: AC
  Administered 2015-11-10: 225 mg via INTRAVENOUS
  Filled 2015-11-10: qty 9

## 2015-11-10 MED ORDER — SODIUM CHLORIDE 0.9 % IV SOLN
2400.0000 mg/m2 | INTRAVENOUS | Status: DC
  Administered 2015-11-10: 3350 mg via INTRAVENOUS
  Filled 2015-11-10: qty 67

## 2015-11-10 NOTE — Assessment & Plan Note (Addendum)
#  Colon cancer- cecal/ right-sided; STAGE IV [intraabdominal/retroperitonel LN/Supracla; K-ras mutated]. Currently on FOLFOX palliative chemotherapy+ avastin. Clinically no evidence of progression tolerating treatments fairly well.  # Leukocytosis likely from Neulasta; previously neutropenia from chemotherapy- continue Neulasta for now. Proceed with chemotherapy cycle #5 today.  # Hypokalemia- from chemotherapy/ mild diarrhea. Potassium 40 mEq twice a day.   # Follow-up with me in 2 weeks with chemotherapy Avastin. Labs. We will order a CT scan at next visit. 

## 2015-11-10 NOTE — Progress Notes (Signed)
[place] NOTE  Patient Care Team: Coral Spikes, DO as PCP - General (Family Medicine)  CHIEF COMPLAINTS/PURPOSE OF CONSULTATION:   Oncology History   # [facility] 2017- COLON CANCER- STAGE IV [s/p right hemi-colectomy; pT4apN2 (7/19LN)]; Pre-op CEA- [id number]-  PET-  mesenteric adenopathy/mediastinal adenopathy/right-sided subclavicular lymph node.  K-ras-MUTATED;  April 17th -START FOLFOX; May 1st Add Avastin  # MSI- STABLE.      Cancer of ascending colon metastatic to intra-abdominal lymph node ([place])   09/06/2015 Initial Diagnosis Cancer of ascending colon metastatic to intra-abdominal lymph node ([facility])     HISTORY OF PRESENTING ILLNESS:  Nancy Clark 55 y.o.  female  with a newly diagnosed right-sided colon cancer status post right hemicolectomy is here palliative FOLFOX plus Avastin.  Patient denies any further hospitalizations. Denies any blood in stools black red stools.  Nausea no vomiting. Mild tingling and numbness of the extremities.  No fever no chills. She complained of mild body aches after the Neulasta injection.  ROS: A complete 10 point review of system is done which is negative except mentioned above in history of present illness  MEDICAL HISTORY:  Past Medical History  Diagnosis Date  . Chicken pox   . Colon cancer [facility])     SURGICAL HISTORY: Past Surgical History  Procedure Laterality Date  . Shoulder surgery Left   . Ectopic pregnancy surgery    . Colonoscopy    . Breast biopsy Right     2007? pt unsure done by [facility]  . Colostomy revision Right 08/09/2015    Procedure: COLON RESECTION RIGHT;  Surgeon: Florene Glen, MD;  Location: [facility] ORS;  Service: General;  Laterality: Right;  . Portacath placement N/A 09/01/2015    Procedure: INSERTION PORT-A-CATH;  Surgeon: Florene Glen, MD;  Location: [facility] ORS;  Service: General;  Laterality: N/A;  . 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  1 pack in 3 days; no alcohol. 2 childern [25 & 17 years] Social History   Social History  . Marital Status: Married    Spouse Name: N/A  . Number of Children: N/A  . Years of Education: N/A   Occupational History  . Not on file.   Social History Main Topics  . Smoking status: Former Smoker -- 0.25 packs/day for 2 years  . Smokeless tobacco: Never Used     Comment: recently quit  . Alcohol Use: No  . Drug Use: No  . Sexual Activity: Yes   Other Topics Concern  . Not on file   Social History Narrative    FAMILY HISTORY: 1 brother& 1 sister- no cancer. Mom-no cancer Family History  Problem Relation Age of Onset  . Hypertension Mother   . Arthritis Father   . Prostate cancer Father   . Diabetes Maternal Grandmother   . Colon cancer Maternal Grandfather     colon    ALLERGIES:  has No Known Allergies.  MEDICATIONS:  Current Outpatient Prescriptions  Medication Sig Dispense Refill  . acetaminophen (TYLENOL) 325 MG tablet Take 2 tablets (650 mg total) by mouth every 6 (six) hours as needed for mild pain (or Fever >/= 101).    . feeding supplement, ENSURE ENLIVE, (ENSURE ENLIVE) LIQD Take 237 mLs by mouth 2 (two) times daily between meals. 60 Bottle 3  .  ferrous sulfate (FERROUSUL) 325 (65 FE) MG tablet Take 1 tablet (325 mg total) by mouth daily with breakfast. 90 tablet 3  . HYDROcodone-acetaminophen (NORCO/VICODIN) 5-325 MG tablet Take 1 tablet by mouth every 6 (six) hours as needed for moderate pain or severe pain. 25 tablet 0  . lidocaine-prilocaine (EMLA) cream Apply 1 application topically as needed. Apply generously over the Mediport 45 minutes prior to chemotherapy. 30 g 0  . Multiple Vitamins-Minerals (MULTIVITAMIN ADULT PO) Take 1 tablet by mouth daily.    . ondansetron (ZOFRAN) 8 MG tablet Take 1 tablet (8 mg total)  by mouth every 8 (eight) hours as needed for nausea or vomiting (start 3 days; after chemo). 40 tablet 0  . potassium chloride SA (K-DUR,KLOR-CON) 20 MEQ tablet Take 2 tablets (40 mEq total) by mouth 2 (two) times daily. 30 tablet 3  . prochlorperazine (COMPAZINE) 10 MG tablet Take 1 tablet (10 mg total) by mouth every 6 (six) hours as needed for nausea or vomiting. 30 tablet 0   No current facility-administered medications for this visit.   Facility-Administered Medications Ordered in Other Visits  Medication Dose Route Frequency Provider Last Rate Last Dose  . sodium chloride flush (NS) 0.9 % injection 10 mL  10 mL Intravenous PRN Cammie Sickle, MD   10 mL at 10/04/15 0901      .  PHYSICAL EXAMINATION: ECOG PERFORMANCE STATUS: 0 - Asymptomatic  Filed Vitals:   11/10/15 1012  BP: 147/82  Pulse: 63  Temp: 95.7 F (35.4 C)   Filed Weights   11/10/15 1012  Weight: 99 lb 6.8 oz (45.1 kg)    GENERAL: Well-nourished well-developed; thin built; Alert, no distress and comfortable.   Accompanied by her mother.  EYES: no pallor or icterus OROPHARYNX: no thrush or ulceration; good dentition  NECK: supple, no masses felt LYMPH:  no palpable lymphadenopathy in the cervical, axillary or inguinal regions LUNGS: clear to auscultation and  No wheeze or crackles HEART/CVS: regular rate & rhythm and no murmurs; No lower extremity edema ABDOMEN: abdomen soft, non-tender and normal bowel sounds; surgical incisions noted. No infections. Musculoskeletal:no cyanosis of digits and no clubbing  PSYCH: alert & oriented x 3 with fluent speech NEURO: no focal motor/sensory deficits SKIN:  no rashes or significant lesions  LABORATORY DATA:  I have reviewed the data as listed Lab Results  Component Value Date   WBC 19.1* 11/10/2015   HGB 11.5* 11/10/2015   HCT 35.1 11/10/2015   MCV 87.8 11/10/2015   PLT 90* 11/10/2015    Recent Labs  10/12/15 1409  10/20/15 0848 10/27/15 0900  11/10/15 1000  NA 141  < > 141 138 140  K 3.2*  < > 2.8* 4.6 3.5  CL 109  < > 110 107 113*  CO2 27  < > 23 26 22   GLUCOSE 98  < > 121* 87 105*  BUN 6  < > 6 9 13   CREATININE 0.56  < > 0.60 0.76 0.65  CALCIUM 8.5*  < > 8.3* 9.0 8.8*  GFRNONAA >60  < > >60 >60 >60  GFRAA >60  < > >60 >60 >60  PROT 5.9*  --  5.6*  --  6.4*  ALBUMIN 2.9*  --  2.9*  --  3.5  AST 14*  --  20  --  17  ALT 13*  --  15  --  14  ALKPHOS 60  --  62  --  118  BILITOT 0.2*  --  0.3  --  0.3  < > = values in this interval not displayed.   ASSESSMENT & PLAN:   .Cancer of ascending colon metastatic to intra-abdominal lymph node ([place]) # Colon cancer- cecal/ right-sided; STAGE IV [intraabdominal/retroperitonel LN/Supracla; K-ras mutated]. Currently on FOLFOX palliative chemotherapy+ avastin. Clinically no evidence of progression tolerating treatments fairly well.  # Leukocytosis likely from Neulasta; previously neutropenia from chemotherapy- continue Neulasta for now. Proceed with chemotherapy cycle #5 today.  # Hypokalemia- from chemotherapy/ mild diarrhea. Potassium 40 mEq twice a day.   # Follow-up with me in 2 weeks with chemotherapy Avastin. Labs. We will order a CT scan at next visit.      Cammie Sickle, MD 11/10/2015 7:04 PM

## 2015-11-10 NOTE — Progress Notes (Signed)
Met with patient and her mother [name] today during clinic visit while patient was waiting to see her physician.  The purpose of my visit was to learn if patient wanted to participate in NSABP [facility] Surgical Adjuvant Breast and Bowel Project) MPR-1 research study: The NSABP Patient Registry and Art gallery manager.  Patient was given a copy of the study consent at her last visit at the clinic to take home and review.  Patient informed me that she does not want to take part in the study.  I thanked patient for her consideration of the trial.

## 2015-11-10 NOTE — Progress Notes (Signed)
Plts are 90. MD aware and ok to proceed with treatment.

## 2015-11-12 ENCOUNTER — Inpatient Hospital Stay: Payer: 59

## 2015-11-12 DIAGNOSIS — C772 Secondary and unspecified malignant neoplasm of intra-abdominal lymph nodes: Secondary | ICD-10-CM

## 2015-11-12 DIAGNOSIS — C182 Malignant neoplasm of ascending colon: Secondary | ICD-10-CM

## 2015-11-12 DIAGNOSIS — C778 Secondary and unspecified malignant neoplasm of lymph nodes of multiple regions: Secondary | ICD-10-CM | POA: Diagnosis not present

## 2015-11-12 DIAGNOSIS — Z9049 Acquired absence of other specified parts of digestive tract: Secondary | ICD-10-CM | POA: Diagnosis not present

## 2015-11-12 DIAGNOSIS — D701 Agranulocytosis secondary to cancer chemotherapy: Secondary | ICD-10-CM | POA: Diagnosis not present

## 2015-11-12 DIAGNOSIS — C801 Malignant (primary) neoplasm, unspecified: Secondary | ICD-10-CM

## 2015-11-12 DIAGNOSIS — E876 Hypokalemia: Secondary | ICD-10-CM | POA: Diagnosis not present

## 2015-11-12 DIAGNOSIS — T451X5S Adverse effect of antineoplastic and immunosuppressive drugs, sequela: Secondary | ICD-10-CM | POA: Diagnosis not present

## 2015-11-12 DIAGNOSIS — Z5112 Encounter for antineoplastic immunotherapy: Secondary | ICD-10-CM | POA: Diagnosis not present

## 2015-11-12 DIAGNOSIS — Z5111 Encounter for antineoplastic chemotherapy: Secondary | ICD-10-CM | POA: Diagnosis not present

## 2015-11-12 DIAGNOSIS — D72829 Elevated white blood cell count, unspecified: Secondary | ICD-10-CM | POA: Diagnosis not present

## 2015-11-12 MED ORDER — PEGFILGRASTIM 6 MG/0.6ML ~~LOC~~ PSKT
6.0000 mg | PREFILLED_SYRINGE | Freq: Once | SUBCUTANEOUS | Status: AC
  Administered 2015-11-12: 6 mg via SUBCUTANEOUS
  Filled 2015-11-12: qty 0.6

## 2015-11-12 MED ORDER — HEPARIN SOD (PORK) LOCK FLUSH 100 UNIT/ML IV SOLN
500.0000 [IU] | Freq: Once | INTRAVENOUS | Status: AC
  Administered 2015-11-12: 500 [IU] via INTRAVENOUS
  Filled 2015-11-12: qty 5

## 2015-11-12 MED ORDER — SODIUM CHLORIDE 0.9% FLUSH
10.0000 mL | INTRAVENOUS | Status: DC | PRN
  Administered 2015-11-12: 10 mL via INTRAVENOUS
  Filled 2015-11-12: qty 10

## 2015-11-24 ENCOUNTER — Inpatient Hospital Stay (HOSPITAL_BASED_OUTPATIENT_CLINIC_OR_DEPARTMENT_OTHER): Payer: 59 | Admitting: Internal Medicine

## 2015-11-24 ENCOUNTER — Inpatient Hospital Stay: Payer: 59

## 2015-11-24 ENCOUNTER — Telehealth: Payer: Self-pay | Admitting: *Deleted

## 2015-11-24 ENCOUNTER — Other Ambulatory Visit: Payer: Self-pay | Admitting: *Deleted

## 2015-11-24 ENCOUNTER — Inpatient Hospital Stay: Payer: 59 | Attending: Internal Medicine

## 2015-11-24 VITALS — BP 132/78 | HR 74 | Temp 96.9°F | Resp 18 | Wt 95.7 lb

## 2015-11-24 VITALS — BP 120/70 | HR 68

## 2015-11-24 DIAGNOSIS — Z9049 Acquired absence of other specified parts of digestive tract: Secondary | ICD-10-CM | POA: Insufficient documentation

## 2015-11-24 DIAGNOSIS — C182 Malignant neoplasm of ascending colon: Secondary | ICD-10-CM | POA: Insufficient documentation

## 2015-11-24 DIAGNOSIS — Z8042 Family history of malignant neoplasm of prostate: Secondary | ICD-10-CM | POA: Insufficient documentation

## 2015-11-24 DIAGNOSIS — C772 Secondary and unspecified malignant neoplasm of intra-abdominal lymph nodes: Principal | ICD-10-CM

## 2015-11-24 DIAGNOSIS — Z800 Family history of malignant neoplasm of digestive organs: Secondary | ICD-10-CM | POA: Insufficient documentation

## 2015-11-24 DIAGNOSIS — E876 Hypokalemia: Secondary | ICD-10-CM

## 2015-11-24 DIAGNOSIS — R200 Anesthesia of skin: Secondary | ICD-10-CM | POA: Diagnosis not present

## 2015-11-24 DIAGNOSIS — Z7689 Persons encountering health services in other specified circumstances: Secondary | ICD-10-CM | POA: Insufficient documentation

## 2015-11-24 DIAGNOSIS — R202 Paresthesia of skin: Secondary | ICD-10-CM | POA: Diagnosis not present

## 2015-11-24 DIAGNOSIS — Z87891 Personal history of nicotine dependence: Secondary | ICD-10-CM | POA: Diagnosis not present

## 2015-11-24 DIAGNOSIS — Z79899 Other long term (current) drug therapy: Secondary | ICD-10-CM | POA: Insufficient documentation

## 2015-11-24 DIAGNOSIS — D649 Anemia, unspecified: Secondary | ICD-10-CM | POA: Insufficient documentation

## 2015-11-24 DIAGNOSIS — R110 Nausea: Secondary | ICD-10-CM | POA: Insufficient documentation

## 2015-11-24 DIAGNOSIS — R634 Abnormal weight loss: Secondary | ICD-10-CM | POA: Insufficient documentation

## 2015-11-24 DIAGNOSIS — Z5111 Encounter for antineoplastic chemotherapy: Secondary | ICD-10-CM | POA: Diagnosis not present

## 2015-11-24 DIAGNOSIS — R197 Diarrhea, unspecified: Secondary | ICD-10-CM | POA: Diagnosis not present

## 2015-11-24 DIAGNOSIS — R630 Anorexia: Secondary | ICD-10-CM | POA: Insufficient documentation

## 2015-11-24 LAB — CBC WITH DIFFERENTIAL/PLATELET
BASOS ABS: 0 10*3/uL (ref 0–0.1)
BASOS PCT: 0 %
Eosinophils Absolute: 0.1 10*3/uL (ref 0–0.7)
Eosinophils Relative: 1 %
HEMATOCRIT: 36.5 % (ref 35.0–47.0)
HEMOGLOBIN: 12.1 g/dL (ref 12.0–16.0)
Lymphocytes Relative: 12 %
Lymphs Abs: 2 10*3/uL (ref 1.0–3.6)
MCH: 29.8 pg (ref 26.0–34.0)
MCHC: 33.3 g/dL (ref 32.0–36.0)
MCV: 89.5 fL (ref 80.0–100.0)
MONO ABS: 1.1 10*3/uL — AB (ref 0.2–0.9)
MONOS PCT: 7 %
NEUTROS ABS: 13.9 10*3/uL — AB (ref 1.4–6.5)
Neutrophils Relative %: 80 %
Platelets: 150 10*3/uL (ref 150–440)
RBC: 4.08 MIL/uL (ref 3.80–5.20)
RDW: 26.2 % — AB (ref 11.5–14.5)
WBC: 17.1 10*3/uL — ABNORMAL HIGH (ref 3.6–11.0)

## 2015-11-24 LAB — COMPREHENSIVE METABOLIC PANEL
ALBUMIN: 3.7 g/dL (ref 3.5–5.0)
ALK PHOS: 114 U/L (ref 38–126)
ALT: 21 U/L (ref 14–54)
AST: 21 U/L (ref 15–41)
Anion gap: 5 (ref 5–15)
BILIRUBIN TOTAL: 0.3 mg/dL (ref 0.3–1.2)
BUN: 11 mg/dL (ref 6–20)
CALCIUM: 8.9 mg/dL (ref 8.9–10.3)
CO2: 20 mmol/L — ABNORMAL LOW (ref 22–32)
CREATININE: 0.68 mg/dL (ref 0.44–1.00)
Chloride: 112 mmol/L — ABNORMAL HIGH (ref 101–111)
GFR calc Af Amer: >60 mL/min (ref >60)
GLUCOSE: 102 mg/dL — AB (ref 65–99)
POTASSIUM: 4.1 mmol/L (ref 3.5–5.1)
Sodium: 137 mmol/L (ref 135–145)
TOTAL PROTEIN: 6.6 g/dL (ref 6.5–8.1)

## 2015-11-24 MED ORDER — SODIUM CHLORIDE 0.9 % IV SOLN
10.0000 mg | Freq: Once | INTRAVENOUS | Status: AC
  Administered 2015-11-24: 10 mg via INTRAVENOUS
  Filled 2015-11-24: qty 1

## 2015-11-24 MED ORDER — PALONOSETRON HCL INJECTION 0.25 MG/5ML
0.2500 mg | Freq: Once | INTRAVENOUS | Status: AC
  Administered 2015-11-24: 0.25 mg via INTRAVENOUS
  Filled 2015-11-24: qty 5

## 2015-11-24 MED ORDER — LEUCOVORIN CALCIUM INJECTION 350 MG
550.0000 mg | Freq: Once | INTRAVENOUS | Status: AC
  Administered 2015-11-24: 550 mg via INTRAVENOUS
  Filled 2015-11-24: qty 25

## 2015-11-24 MED ORDER — OXALIPLATIN CHEMO INJECTION 100 MG/20ML
85.0000 mg/m2 | Freq: Once | INTRAVENOUS | Status: AC
  Administered 2015-11-24: 120 mg via INTRAVENOUS
  Filled 2015-11-24: qty 4

## 2015-11-24 MED ORDER — FLUOROURACIL CHEMO INJECTION 2.5 GM/50ML
400.0000 mg/m2 | Freq: Once | INTRAVENOUS | Status: AC
  Administered 2015-11-24: 550 mg via INTRAVENOUS
  Filled 2015-11-24: qty 11

## 2015-11-24 MED ORDER — SODIUM CHLORIDE 0.9% FLUSH
10.0000 mL | INTRAVENOUS | Status: DC | PRN
  Administered 2015-11-24: 10 mL
  Filled 2015-11-24: qty 10

## 2015-11-24 MED ORDER — DEXTROSE 5 % IV SOLN
Freq: Once | INTRAVENOUS | Status: AC
  Administered 2015-11-24: 13:00:00 via INTRAVENOUS
  Filled 2015-11-24: qty 1000

## 2015-11-24 MED ORDER — HEPARIN SOD (PORK) LOCK FLUSH 100 UNIT/ML IV SOLN: 500.0000 [IU] | Freq: Once | INTRAVENOUS | Status: DC | PRN

## 2015-11-24 MED ORDER — BEVACIZUMAB CHEMO INJECTION 400 MG/16ML
5.0000 mg/kg | Freq: Once | INTRAVENOUS | Status: AC
  Administered 2015-11-24: 225 mg via INTRAVENOUS
  Filled 2015-11-24: qty 9

## 2015-11-24 MED ORDER — SODIUM CHLORIDE 0.9 % IV SOLN
Freq: Once | INTRAVENOUS | Status: AC
  Administered 2015-11-24: 11:00:00 via INTRAVENOUS
  Filled 2015-11-24: qty 1000

## 2015-11-24 MED ORDER — FLUOROURACIL CHEMO INJECTION 5 GM/100ML
2400.0000 mg/m2 | INTRAVENOUS | Status: DC
  Administered 2015-11-24: 3350 mg via INTRAVENOUS
  Filled 2015-11-24: qty 67

## 2015-11-24 NOTE — Progress Notes (Signed)
[place] NOTE  Patient Care Team: Coral Spikes, DO as PCP - General (Family Medicine)  CHIEF COMPLAINTS/PURPOSE OF CONSULTATION:   Oncology History   # [facility] 2017- COLON CANCER- STAGE IV [s/p right hemi-colectomy; pT4apN2 (7/19LN)]; Pre-op CEA- [id number]-  PET-  mesenteric adenopathy/mediastinal adenopathy/right-sided subclavicular lymph node.  K-ras-MUTATED;  April 17th -START FOLFOX; May 1st Add Avastin  # MSI- STABLE.      Cancer of ascending colon metastatic to intra-abdominal lymph node ([place])   09/06/2015 Initial Diagnosis Cancer of ascending colon metastatic to intra-abdominal lymph node (HCC)     HISTORY OF PRESENTING ILLNESS:  Nancy Clark 55 y.o.  female  with a diagnosed right-sided colon cancer status post right hemicolectomy is here palliative FOLFOX plus Avastin.  Appetite is good. Nausea no vomiting. Mild tingling and numbness of the extremities.  No fever no chills. She complained of mild body aches after the Neulasta injection.  ROS: A complete 10 point review of system is done which is negative except mentioned above in history of present illness  MEDICAL HISTORY:  Past Medical History  Diagnosis Date  . Chicken pox   . Colon cancer [facility])     SURGICAL HISTORY: Past Surgical History  Procedure Laterality Date  . Shoulder surgery Left   . Ectopic pregnancy surgery    . Colonoscopy    . Breast biopsy Right     2007? pt unsure done by ASA  . Colostomy revision Right 08/09/2015    Procedure: COLON RESECTION RIGHT;  Surgeon: Florene Glen, MD;  Location: [facility] ORS;  Service: General;  Laterality: Right;  . Portacath placement N/A 09/01/2015    Procedure: INSERTION PORT-A-CATH;  Surgeon: Florene Glen, MD;  Location: [facility] ORS;  Service: General;  Laterality: N/A;  . 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  1 pack in 3 days; no alcohol. 2 childern [25 & 17 years] Social History   Social History  . Marital Status: Married    Spouse Name: N/A  . Number of Children: N/A  . Years of Education: N/A   Occupational History  . Not on file.   Social History Main Topics  . Smoking status: Former Smoker -- 0.25 packs/day for 2 years  . Smokeless tobacco: Never Used     Comment: recently quit  . Alcohol Use: No  . Drug Use: No  . Sexual Activity: Yes   Other Topics Concern  . Not on file   Social History Narrative    FAMILY HISTORY: 1 brother& 1 sister- no cancer. Mom-no cancer Family History  Problem Relation Age of Onset  . Hypertension Mother   . Arthritis Father   . Prostate cancer Father   . Diabetes Maternal Grandmother   . Colon cancer Maternal Grandfather     colon    ALLERGIES:  has No Known Allergies.  MEDICATIONS:  Current Outpatient Prescriptions  Medication Sig Dispense Refill  . acetaminophen (TYLENOL) 325 MG tablet Take 2 tablets (650 mg total) by mouth every 6 (six) hours as needed for mild pain (or Fever >/= 101).    . feeding supplement, ENSURE ENLIVE, (ENSURE ENLIVE) LIQD Take 237 mLs by mouth 2 (two) times daily between meals. 60 Bottle 3  . ferrous sulfate (FERROUSUL) 325 (65 FE) MG tablet Take 1 tablet (  325 mg total) by mouth daily with breakfast. 90 tablet 3  . HYDROcodone-acetaminophen (NORCO/VICODIN) 5-325 MG tablet Take 1 tablet by mouth every 6 (six) hours as needed for moderate pain or severe pain. 25 tablet 0  . lidocaine-prilocaine (EMLA) cream Apply 1 application topically as needed. Apply generously over the Mediport 45 minutes prior to chemotherapy. 30 g 0  . Multiple Vitamins-Minerals (MULTIVITAMIN ADULT PO) Take 1 tablet by mouth daily.    . ondansetron (ZOFRAN) 8 MG tablet Take 1 tablet (8 mg total) by mouth every 8 (eight) hours as needed for nausea or vomiting (start 3  days; after chemo). 40 tablet 0  . potassium chloride SA (K-DUR,KLOR-CON) 20 MEQ tablet Take 2 tablets (40 mEq total) by mouth 2 (two) times daily. 30 tablet 3  . prochlorperazine (COMPAZINE) 10 MG tablet Take 1 tablet (10 mg total) by mouth every 6 (six) hours as needed for nausea or vomiting. 30 tablet 0   No current facility-administered medications for this visit.   Facility-Administered Medications Ordered in Other Visits  Medication Dose Route Frequency Provider Last Rate Last Dose  . fluorouracil (ADRUCIL) 3,350 mg in sodium chloride 0.9 % 83 mL chemo infusion  2,400 mg/m2 (Treatment Plan Actual) Intravenous 1 day or 1 dose Cammie Sickle, MD   3,350 mg at 11/24/15 1505  . heparin lock flush 100 unit/mL  500 Units Intracatheter Once PRN Cammie Sickle, MD   500 Units at 11/24/15 1129  . sodium chloride flush (NS) 0.9 % injection 10 mL  10 mL Intravenous PRN Cammie Sickle, MD   10 mL at 10/04/15 0901  . sodium chloride flush (NS) 0.9 % injection 10 mL  10 mL Intracatheter PRN Cammie Sickle, MD   10 mL at 11/24/15 1000      .  PHYSICAL EXAMINATION: ECOG PERFORMANCE STATUS: 0 - Asymptomatic  Filed Vitals:   11/24/15 1000  BP: 132/78  Pulse: 74  Temp: 96.9 F (36.1 C)  Resp: 18   Filed Weights   11/24/15 1000  Weight: 95 lb 10.9 oz (43.4 kg)    GENERAL: Well-nourished well-developed; thin built; Alert, no distress and comfortable.   Accompanied by her mother.  EYES: no pallor or icterus OROPHARYNX: no thrush or ulceration; good dentition  NECK: supple, no masses felt LYMPH:  no palpable lymphadenopathy in the cervical, axillary or inguinal regions LUNGS: clear to auscultation and  No wheeze or crackles HEART/CVS: regular rate & rhythm and no murmurs; No lower extremity edema ABDOMEN: abdomen soft, non-tender and normal bowel sounds; surgical incisions noted. No infections. Musculoskeletal:no cyanosis of digits and no clubbing  PSYCH: alert &  oriented x 3 with fluent speech NEURO: no focal motor/sensory deficits SKIN:  no rashes or significant lesions  LABORATORY DATA:  I have reviewed the data as listed Lab Results  Component Value Date   WBC 17.1* 11/24/2015   HGB 12.1 11/24/2015   HCT 36.5 11/24/2015   MCV 89.5 11/24/2015   PLT 150 11/24/2015    Recent Labs  10/20/15 0848 10/27/15 0900 11/10/15 1000 11/24/15 0918  NA 141 138 140 137  K 2.8* 4.6 3.5 4.1  CL 110 107 113* 112*  CO2 23 26 22  20*  GLUCOSE 121* 87 105* 102*  BUN 6 9 13 11   CREATININE 0.60 0.76 0.65 0.68  CALCIUM 8.3* 9.0 8.8* 8.9  GFRNONAA >60 >60 >60 >60  GFRAA >60 >60 >60 >60  PROT 5.6*  --  6.4* 6.6  ALBUMIN  2.9*  --  3.5 3.7  AST 20  --  17 21  ALT 15  --  14 21  ALKPHOS 62  --  118 114  BILITOT 0.3  --  0.3 0.3     ASSESSMENT & PLAN:   .Cancer of ascending colon metastatic to intra-abdominal lymph node (HCC) # Colon cancer- cecal/ right-sided; STAGE IV [intraabdominal/retroperitonel LN/Supracla; K-ras mutated]. Currently on FOLFOX palliative chemotherapy+ avastin. Clinically no evidence of progression tolerating treatments fairly well.  # Hypokalemia- from chemotherapy/ mild diarrhea. Potassium 40 mEq twice a day.   # Follow-up with me in 2 weeks with chemotherapy Avastin. Labs. CT scan chest and pelvis in about 10 days.       Cammie Sickle, MD 11/24/2015 7:07 PM

## 2015-11-24 NOTE — Assessment & Plan Note (Addendum)
#  Colon cancer- cecal/ right-sided; STAGE IV [intraabdominal/retroperitonel LN/Supracla; K-ras mutated]. Currently on FOLFOX palliative chemotherapy+ avastin. Clinically no evidence of progression tolerating treatments fairly well.  # Hypokalemia- from chemotherapy/ mild diarrhea. Potassium 40 mEq twice a day.   # Follow-up with me in 2 weeks with chemotherapy Avastin. Labs. CT scan chest and pelvis in about 10 days.

## 2015-11-24 NOTE — Telephone Encounter (Signed)
rcvd call from natalie in cancer center lab. U/A collected on 6/21 not processed per md order. "wrong test entered."  Order entered for u/A to be recollected at next apt as patient is on avastin.

## 2015-11-25 ENCOUNTER — Ambulatory Visit: Payer: 59

## 2015-11-25 ENCOUNTER — Other Ambulatory Visit: Payer: 59

## 2015-11-25 ENCOUNTER — Ambulatory Visit: Payer: 59 | Admitting: Internal Medicine

## 2015-11-26 ENCOUNTER — Inpatient Hospital Stay: Payer: 59

## 2015-11-26 VITALS — BP 130/91 | HR 98 | Temp 97.7°F | Resp 20

## 2015-11-26 DIAGNOSIS — C772 Secondary and unspecified malignant neoplasm of intra-abdominal lymph nodes: Principal | ICD-10-CM

## 2015-11-26 DIAGNOSIS — Z9049 Acquired absence of other specified parts of digestive tract: Secondary | ICD-10-CM | POA: Diagnosis not present

## 2015-11-26 DIAGNOSIS — Z87891 Personal history of nicotine dependence: Secondary | ICD-10-CM | POA: Diagnosis not present

## 2015-11-26 DIAGNOSIS — C182 Malignant neoplasm of ascending colon: Secondary | ICD-10-CM | POA: Diagnosis not present

## 2015-11-26 DIAGNOSIS — Z800 Family history of malignant neoplasm of digestive organs: Secondary | ICD-10-CM | POA: Diagnosis not present

## 2015-11-26 DIAGNOSIS — Z5111 Encounter for antineoplastic chemotherapy: Secondary | ICD-10-CM | POA: Diagnosis not present

## 2015-11-26 DIAGNOSIS — Z79899 Other long term (current) drug therapy: Secondary | ICD-10-CM | POA: Diagnosis not present

## 2015-11-26 DIAGNOSIS — Z7689 Persons encountering health services in other specified circumstances: Secondary | ICD-10-CM | POA: Diagnosis not present

## 2015-11-26 DIAGNOSIS — Z8042 Family history of malignant neoplasm of prostate: Secondary | ICD-10-CM | POA: Diagnosis not present

## 2015-11-26 MED ORDER — PEGFILGRASTIM 6 MG/0.6ML ~~LOC~~ PSKT
6.0000 mg | PREFILLED_SYRINGE | Freq: Once | SUBCUTANEOUS | Status: AC
  Administered 2015-11-26: 6 mg via SUBCUTANEOUS
  Filled 2015-11-26: qty 0.6

## 2015-11-26 MED ORDER — SODIUM CHLORIDE 0.9% FLUSH
10.0000 mL | INTRAVENOUS | Status: DC | PRN
  Administered 2015-11-26: 10 mL
  Filled 2015-11-26: qty 10

## 2015-11-26 MED ORDER — HEPARIN SOD (PORK) LOCK FLUSH 100 UNIT/ML IV SOLN
500.0000 [IU] | Freq: Once | INTRAVENOUS | Status: AC | PRN
Start: 2015-11-26 — End: 2015-11-26
  Administered 2015-11-26: 500 [IU]
  Filled 2015-11-26: qty 5

## 2015-11-30 ENCOUNTER — Telehealth: Payer: Self-pay | Admitting: *Deleted

## 2015-11-30 NOTE — Telephone Encounter (Signed)
Pt came to cancer center to meet with RN on Friday 11/26/15.  She met with [place] and explained that she is unable to return to work given her chronic fatiguePatient needs letter written to [employer] and [employer] to state that she is unable to return back to work at this time.  She also states that her last office note needs to be faxed to both [employer] and [employer] as soon as possible.   [employer] fax provided  [phone]-Fax note/records to attention: [employer] [employer]  [employer] fax - [phone]

## 2015-12-03 ENCOUNTER — Ambulatory Visit
Admission: RE | Admit: 2015-12-03 | Discharge: 2015-12-03 | Disposition: A | Discharge status: Home / Self Care | Payer: 59 | Source: Ambulatory Visit | Attending: Internal Medicine | Admitting: Internal Medicine

## 2015-12-03 ENCOUNTER — Encounter: Payer: Self-pay | Admitting: *Deleted

## 2015-12-03 DIAGNOSIS — I700 Atherosclerosis of aorta: Secondary | ICD-10-CM | POA: Insufficient documentation

## 2015-12-03 DIAGNOSIS — C182 Malignant neoplasm of ascending colon: Secondary | ICD-10-CM | POA: Insufficient documentation

## 2015-12-03 DIAGNOSIS — R918 Other nonspecific abnormal finding of lung field: Secondary | ICD-10-CM | POA: Insufficient documentation

## 2015-12-03 DIAGNOSIS — C772 Secondary and unspecified malignant neoplasm of intra-abdominal lymph nodes: Secondary | ICD-10-CM | POA: Diagnosis not present

## 2015-12-03 DIAGNOSIS — C189 Malignant neoplasm of colon, unspecified: Secondary | ICD-10-CM | POA: Diagnosis not present

## 2015-12-03 MED ORDER — IOPAMIDOL (ISOVUE-370) INJECTION 76%
125.0000 mL | Freq: Once | INTRAVENOUS | Status: AC | PRN
  Administered 2015-12-03: 125 mL via INTRAVENOUS

## 2015-12-03 NOTE — Telephone Encounter (Signed)
Letter sent to [facility] and [employer] regarding patient's disability letter.

## 2015-12-06 ENCOUNTER — Inpatient Hospital Stay: Payer: 59

## 2015-12-06 ENCOUNTER — Inpatient Hospital Stay (HOSPITAL_BASED_OUTPATIENT_CLINIC_OR_DEPARTMENT_OTHER): Payer: 59 | Admitting: Internal Medicine

## 2015-12-06 ENCOUNTER — Encounter: Payer: Self-pay | Admitting: Surgery

## 2015-12-06 VITALS — BP 144/89 | HR 81 | Temp 96.7°F | Resp 18 | Wt 93.5 lb

## 2015-12-06 DIAGNOSIS — R634 Abnormal weight loss: Secondary | ICD-10-CM

## 2015-12-06 DIAGNOSIS — Z7689 Persons encountering health services in other specified circumstances: Secondary | ICD-10-CM

## 2015-12-06 DIAGNOSIS — E876 Hypokalemia: Secondary | ICD-10-CM

## 2015-12-06 DIAGNOSIS — Z5111 Encounter for antineoplastic chemotherapy: Secondary | ICD-10-CM

## 2015-12-06 DIAGNOSIS — C182 Malignant neoplasm of ascending colon: Secondary | ICD-10-CM

## 2015-12-06 DIAGNOSIS — Z87891 Personal history of nicotine dependence: Secondary | ICD-10-CM

## 2015-12-06 DIAGNOSIS — R110 Nausea: Secondary | ICD-10-CM

## 2015-12-06 DIAGNOSIS — C772 Secondary and unspecified malignant neoplasm of intra-abdominal lymph nodes: Principal | ICD-10-CM

## 2015-12-06 DIAGNOSIS — R197 Diarrhea, unspecified: Secondary | ICD-10-CM

## 2015-12-06 DIAGNOSIS — Z79899 Other long term (current) drug therapy: Secondary | ICD-10-CM

## 2015-12-06 DIAGNOSIS — Z8042 Family history of malignant neoplasm of prostate: Secondary | ICD-10-CM | POA: Diagnosis not present

## 2015-12-06 DIAGNOSIS — Z800 Family history of malignant neoplasm of digestive organs: Secondary | ICD-10-CM | POA: Diagnosis not present

## 2015-12-06 DIAGNOSIS — Z9049 Acquired absence of other specified parts of digestive tract: Secondary | ICD-10-CM | POA: Diagnosis not present

## 2015-12-06 DIAGNOSIS — R202 Paresthesia of skin: Secondary | ICD-10-CM

## 2015-12-06 DIAGNOSIS — R200 Anesthesia of skin: Secondary | ICD-10-CM

## 2015-12-06 LAB — URINALYSIS COMPLETE WITH MICROSCOPIC (ARMC ONLY)
BILIRUBIN URINE: NEGATIVE
Bacteria, UA: NONE SEEN
Glucose, UA: NEGATIVE mg/dL
Hgb urine dipstick: NEGATIVE
KETONES UR: NEGATIVE mg/dL
NITRITE: NEGATIVE
PH: 6 (ref 5.0–8.0)
PROTEIN: 30 mg/dL — AB
SPECIFIC GRAVITY, URINE: 1.015 (ref 1.005–1.030)

## 2015-12-06 LAB — COMPREHENSIVE METABOLIC PANEL
ALT: 14 U/L (ref 14–54)
AST: 20 U/L (ref 15–41)
Albumin: 3.7 g/dL (ref 3.5–5.0)
Alkaline Phosphatase: 137 U/L — ABNORMAL HIGH (ref 38–126)
Anion gap: 6 (ref 5–15)
BILIRUBIN TOTAL: 0.3 mg/dL (ref 0.3–1.2)
BUN: 6 mg/dL (ref 6–20)
CALCIUM: 8.7 mg/dL — AB (ref 8.9–10.3)
CO2: 22 mmol/L (ref 22–32)
CREATININE: 0.63 mg/dL (ref 0.44–1.00)
Chloride: 108 mmol/L (ref 101–111)
GFR calc Af Amer: >60 mL/min (ref >60)
Glucose, Bld: 103 mg/dL — ABNORMAL HIGH (ref 65–99)
Potassium: 2.8 mmol/L — ABNORMAL LOW (ref 3.5–5.1)
Sodium: 136 mmol/L (ref 135–145)
TOTAL PROTEIN: 6.5 g/dL (ref 6.5–8.1)

## 2015-12-06 LAB — CBC WITH DIFFERENTIAL/PLATELET
BASOS ABS: 0.1 10*3/uL (ref 0–0.1)
Basophils Relative: 0 %
Eosinophils Absolute: 0.1 10*3/uL (ref 0–0.7)
Eosinophils Relative: 0 %
HEMATOCRIT: 35.7 % (ref 35.0–47.0)
Hemoglobin: 11.9 g/dL — ABNORMAL LOW (ref 12.0–16.0)
LYMPHS ABS: 2 10*3/uL (ref 1.0–3.6)
Lymphocytes Relative: 12 %
MCH: 30.3 pg (ref 26.0–34.0)
MCHC: 33.2 g/dL (ref 32.0–36.0)
MCV: 91.1 fL (ref 80.0–100.0)
MONO ABS: 1.2 10*3/uL — AB (ref 0.2–0.9)
Monocytes Relative: 7 %
NEUTROS ABS: 12.8 10*3/uL — AB (ref 1.4–6.5)
NEUTROS PCT: 81 %
Platelets: 109 10*3/uL — ABNORMAL LOW (ref 150–440)
RBC: 3.92 MIL/uL (ref 3.80–5.20)
RDW: 25.4 % — ABNORMAL HIGH (ref 11.5–14.5)
WBC: 16.1 10*3/uL — ABNORMAL HIGH (ref 3.6–11.0)

## 2015-12-06 MED ORDER — FLUOROURACIL CHEMO INJECTION 2.5 GM/50ML
400.0000 mg/m2 | Freq: Once | INTRAVENOUS | Status: AC
  Administered 2015-12-06: 550 mg via INTRAVENOUS
  Filled 2015-12-06: qty 11

## 2015-12-06 MED ORDER — BEVACIZUMAB CHEMO INJECTION 400 MG/16ML
5.0000 mg/kg | Freq: Once | INTRAVENOUS | Status: AC
  Administered 2015-12-06: 225 mg via INTRAVENOUS
  Filled 2015-12-06: qty 9

## 2015-12-06 MED ORDER — OXALIPLATIN CHEMO INJECTION 100 MG/20ML
85.0000 mg/m2 | Freq: Once | INTRAVENOUS | Status: AC
  Administered 2015-12-06: 120 mg via INTRAVENOUS
  Filled 2015-12-06: qty 20

## 2015-12-06 MED ORDER — SODIUM CHLORIDE 0.9 % IV SOLN
10.0000 mg | Freq: Once | INTRAVENOUS | Status: AC
  Administered 2015-12-06: 10 mg via INTRAVENOUS
  Filled 2015-12-06: qty 1

## 2015-12-06 MED ORDER — PALONOSETRON HCL INJECTION 0.25 MG/5ML
0.2500 mg | Freq: Once | INTRAVENOUS | Status: AC
  Administered 2015-12-06: 0.25 mg via INTRAVENOUS
  Filled 2015-12-06: qty 5

## 2015-12-06 MED ORDER — DEXTROSE 5 % IV SOLN
Freq: Once | INTRAVENOUS | Status: AC
  Administered 2015-12-06: 13:00:00 via INTRAVENOUS
  Filled 2015-12-06: qty 1000

## 2015-12-06 MED ORDER — SODIUM CHLORIDE 0.9 % IV SOLN
2400.0000 mg/m2 | INTRAVENOUS | Status: DC
  Administered 2015-12-06: 3350 mg via INTRAVENOUS
  Filled 2015-12-06: qty 67

## 2015-12-06 MED ORDER — HEPARIN SOD (PORK) LOCK FLUSH 100 UNIT/ML IV SOLN
500.0000 [IU] | Freq: Once | INTRAVENOUS | Status: DC | PRN
  Filled 2015-12-06: qty 5

## 2015-12-06 MED ORDER — SODIUM CHLORIDE 0.9 % IV SOLN
Freq: Once | INTRAVENOUS | Status: AC
  Administered 2015-12-06: 11:00:00 via INTRAVENOUS
  Filled 2015-12-06: qty 1000

## 2015-12-06 MED ORDER — LEUCOVORIN CALCIUM INJECTION 350 MG
550.0000 mg | Freq: Once | INTRAMUSCULAR | Status: AC
  Administered 2015-12-06: 550 mg via INTRAVENOUS
  Filled 2015-12-06: qty 25

## 2015-12-06 NOTE — Assessment & Plan Note (Addendum)
#  Colon cancer- cecal/ right-sided; STAGE IV [intraabdominal/retroperitonel LN/Supracla; K-ras mutated]. Currently on FOLFOX palliative chemotherapy+ avastin. Clinically no evidence of progression tolerating treatments fairly well.12/03/2015-  CT- near CR- significant response of the right supraclavicular lymph node/ intra-abdominal lymph nodes/mediastinal adenopathy.  # Labs reviewed; unremarkable except for mild anemia/hypokalemia; proceed with chemotherapy Avastin today.  # Hypokalemia- from chemotherapy/ mild diarrhea. Potassium 40 mEq twice a day.   # weight loss- Referred to dietitian  # Diarrhea- grade 1 recommend Imodium  # Follow-up with me in 2 weeks with chemotherapy Avastin. Labs.# I reviewed the blood work- with the patient in detail; also reviewed the imaging independently [as summarized above]; and with the patient in detail.

## 2015-12-06 NOTE — Progress Notes (Signed)
Patient states she is having problems eating.  She has had a 7# weight loss since 11-10-15.  States just the smell of food makes her not want it.  No c/o nausea.  She is supplementing her diet with Boost.

## 2015-12-06 NOTE — Patient Instructions (Signed)
Loperamide oral solution What is this medicine? LOPERAMIDE (loe PER a mide) is used to treat diarrhea. This medicine may be used for other purposes; ask your health care provider or pharmacist if you have questions. What should I tell my health care provider before I take this medicine? They need to know if you have any of these conditions: -a black or bloody stool -bacterial food poisoning -colitis or mucus in your stool -currently taking an antibiotic medication for an infection -fever -liver disease -severe abdominal pain, swelling or bulging -an unusual or allergic reaction to loperamide, other medicines, foods, dyes, or preservatives -pregnant or trying to get pregnant -breast-feeding How should I use this medicine? Take this medicine by mouth. Follow the directions on the prescription label. Use a specially marked spoon or container to measure your medicine. Ask your pharmacist if you do not have one. Household spoons are not accurate. Take your medicine at regular intervals. Do not take your medicine more often than directed. Talk to your pediatrician regarding the use of this medicine in children. While this drug may be prescribed for children as young as 6 years for selected conditions, precautions do apply. Overdosage: If you think you have taken too much of this medicine contact a poison control center or emergency room at once. NOTE: This medicine is only for you. Do not share this medicine with others. What if I miss a dose? This does not apply. This medicine is not for regular use. Only take this medicine while you continue to have loose bowel movements. Do not take more medicine than recommended by the packaging label or by your healthcare professional. What may interact with this medicine? Do not take this medicine with any of the following medications: - alosetron This medicine may also interact with the following  medications: -cimetidine -clarithromycin -erythromycin -gemfibrozil -itraconazole -ketoconazole -quinidine -quinine -ranitidine -ritonavir -saquinavir This list may not describe all possible interactions. Give your health care provider a list of all the medicines, herbs, non-prescription drugs, or dietary supplements you use. Also tell them if you smoke, drink alcohol, or use illegal drugs. Some items may interact with your medicine. What should I watch for while using this medicine? Do not take this medicine for more than 2 days without asking your doctor or health care professional. Do not use doses higher than those prescribed by your doctor or listed on the label. Check with your doctor or health care professional right away if you develop a fever, severe abdominal pain, swelling or bulging, or if you have have bloody/black diarrhea or stools. You may get drowsy or dizzy. Do not drive, use machinery, or do anything that needs mental alertness until you know how this medicine affects you. Do not stand or sit up quickly, especially if you are an older patient. This reduces the risk of dizzy or fainting spells. Alcohol can increase possible drowsiness and dizziness. Avoid alcoholic drinks. Your mouth may get dry. Chewing sugarless gum or sucking hard candy, and drinking plenty of water may help. Contact your doctor if the problem does not go away or is severe. Drinking plenty of water can also help prevent dehydration that can occur with diarrhea. Elderly patients may have a more variable response to the effects of this medicine, and are more susceptible to the effects of dehydration. What side effects may I notice from receiving this medicine? Side effects that you should report to your doctor or health care professional as soon as possible: - allergic reactions like skin   rash, itching or hives, swelling of the face, lips, or tongue -bloated, swollen feeling in your abdomen -blurred  vision -loss of appetite -signs and symptoms of a dangerous change in heartbeat or heart rhythm like chest pain; dizziness; fast or irregular heartbeat palpitations; feeling faint or lightheaded, falls; breathing problems -stomach pain Side effects that usually do not require medical attention (report to your doctor or health care professional if they continue or are bothersome): - constipation -drowsiness or dizziness -dry mouth -nausea, vomiting This list may not describe all possible side effects. Call your doctor for medical advice about side effects. You may report side effects to FDA at 1-800-FDA-1088. Where should I keep my medicine? Keep out of the reach of children. Store at room temperature between 20 and 25 degrees C (68 and 77 degrees F). Do not freeze. Keep container tightly closed. Throw away any unused medicine after the expiration date. NOTE: This sheet is a summary. It may not cover all possible information. If you have questions about this medicine, talk to your doctor, pharmacist, or health care provider.    2016, Elsevier/Gold Standard. (2014-11-03 15:29:28)  

## 2015-12-06 NOTE — Progress Notes (Signed)
Nancy Clark NOTE  Patient Care Team: Nancy Spikes, DO as PCP - General (Family Medicine)  CHIEF COMPLAINTS/PURPOSE OF CONSULTATION:   Oncology History   # [facility] 2017- COLON CANCER- STAGE IV [s/p right hemi-colectomy; pT4apN2 (7/19LN)]; Pre-op CEA- [id number]-  PET-  mesenteric adenopathy/mediastinal adenopathy/right-sided subclavicular lymph node.    April 17th -START FOLFOX; May 1st Add Avastin; 12/03/15-CT C/A/P-near CR    # MOLECULAR TESTING: K-ras-EXON 2- MUTATED; MSI- STABLE.      Cancer of ascending colon metastatic to intra-abdominal lymph node ([place])   09/06/2015 Initial Diagnosis Cancer of ascending colon metastatic to intra-abdominal lymph node (HCC)     HISTORY OF PRESENTING ILLNESS:  Nancy Clark 55 y.o.  female  with a diagnosed Metastatic right-sided colon cancer status post right hemicolectomy currently on palliative FOLFOX plus Avastin.  Patient complains of mild nausea no vomiting. She notices this the most few days after the Neulasta injection. Poor sense of taste; she lost about 4 pounds. Mild tingling and numbness of the extremities.  No fever no chills. Denies any significant diarrhea.  ROS: A complete 10 point review of system is done which is negative except mentioned above in history of present illness  MEDICAL HISTORY:  Past Medical History  Diagnosis Date  . Chicken pox   . Colon cancer [facility])     SURGICAL HISTORY: Past Surgical History  Procedure Laterality Date  . Shoulder surgery Left   . Ectopic pregnancy surgery    . Colonoscopy    . Breast biopsy Right     2007? pt unsure done by [facility]  . Colostomy revision Right 08/09/2015    Procedure: COLON RESECTION RIGHT;  Surgeon: Florene Glen, MD;  Location: [facility] ORS;  Service: General;  Laterality: Right;  . Portacath placement N/A 09/01/2015    Procedure: INSERTION PORT-A-CATH;  Surgeon: Florene Glen, MD;  Location: [facility] ORS;  Service: General;  Laterality: N/A;  .  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  1 pack in 3 days; no alcohol. 2 childern [25 & 17 years] Social History   Social History  . Marital Status: Married    Spouse Name: N/A  . Number of Children: N/A  . Years of Education: N/A   Occupational History  . Not on file.   Social History Main Topics  . Smoking status: Former Smoker -- 0.25 packs/day for 2 years  . Smokeless tobacco: Never Used     Comment: recently quit  . Alcohol Use: No  . Drug Use: No  . Sexual Activity: Yes   Other Topics Concern  . Not on file   Social History Narrative    FAMILY HISTORY: 1 brother& 1 sister- no cancer. Mom-no cancer Family History  Problem Relation Age of Onset  . Hypertension Mother   . Arthritis Father   . Prostate cancer Father   . Diabetes Maternal Grandmother   . Colon cancer Maternal Grandfather     colon    ALLERGIES:  has No Known Allergies.  MEDICATIONS:  Current Outpatient Prescriptions  Medication Sig Dispense Refill  . acetaminophen (TYLENOL) 325 MG tablet Take 2 tablets (650 mg total) by mouth every 6 (six) hours as needed for mild pain (or Fever >/= 101).    . feeding supplement, ENSURE ENLIVE, (ENSURE ENLIVE) LIQD Take  237 mLs by mouth 2 (two) times daily between meals. 60 Bottle 3  . ferrous sulfate (FERROUSUL) 325 (65 FE) MG tablet Take 1 tablet (325 mg total) by mouth daily with breakfast. 90 tablet 3  . HYDROcodone-acetaminophen (NORCO/VICODIN) 5-325 MG tablet Take 1 tablet by mouth every 6 (six) hours as needed for moderate pain or severe pain. 25 tablet 0  . lidocaine-prilocaine (EMLA) cream Apply 1 application topically as needed. Apply generously over the Mediport 45 minutes prior to chemotherapy. 30 g 0  . Multiple Vitamins-Minerals (MULTIVITAMIN ADULT PO) Take 1 tablet  by mouth daily.    . ondansetron (ZOFRAN) 8 MG tablet Take 1 tablet (8 mg total) by mouth every 8 (eight) hours as needed for nausea or vomiting (start 3 days; after chemo). 40 tablet 0  . potassium chloride SA (K-DUR,KLOR-CON) 20 MEQ tablet Take 2 tablets (40 mEq total) by mouth 2 (two) times daily. 30 tablet 3  . prochlorperazine (COMPAZINE) 10 MG tablet Take 1 tablet (10 mg total) by mouth every 6 (six) hours as needed for nausea or vomiting. 30 tablet 0   No current facility-administered medications for this visit.   Facility-Administered Medications Ordered in Other Visits  Medication Dose Route Frequency Provider Last Rate Last Dose  . sodium chloride flush (NS) 0.9 % injection 10 mL  10 mL Intravenous PRN Cammie Sickle, MD   10 mL at 10/04/15 0901      .  PHYSICAL EXAMINATION: ECOG PERFORMANCE STATUS: 0 - Asymptomatic  Filed Vitals:   12/06/15 0948  BP: 144/89  Pulse: 81  Temp: 96.7 F (35.9 C)  Resp: 18   Filed Weights   12/06/15 0948  Weight: 93 lb 8 oz (42.411 kg)    GENERAL: Well-nourished well-developed; thin built; Alert, no distress and comfortable.   Accompanied by her sister.  EYES: no pallor or icterus OROPHARYNX: no thrush or ulceration; good dentition  NECK: supple, no masses felt LYMPH:  no palpable lymphadenopathy in the cervical, axillary or inguinal regions LUNGS: clear to auscultation and  No wheeze or crackles HEART/CVS: regular rate & rhythm and no murmurs; No lower extremity edema ABDOMEN: abdomen soft, non-tender and normal bowel sounds; surgical incisions noted. No infections. Musculoskeletal:no cyanosis of digits and no clubbing  PSYCH: alert & oriented x 3 with fluent speech NEURO: no focal motor/sensory deficits SKIN:  no rashes or significant lesions  LABORATORY DATA:  I have reviewed the data as listed Lab Results  Component Value Date   WBC 16.1* 12/06/2015   HGB 11.9* 12/06/2015   HCT 35.7 12/06/2015   MCV 91.1 12/06/2015    PLT 109* 12/06/2015    Recent Labs  11/10/15 1000 11/24/15 0918 12/06/15 0918  NA 140 137 136  K 3.5 4.1 2.8*  CL 113* 112* 108  CO2 22 20* 22  GLUCOSE 105* 102* 103*  BUN _0 CREATININE 0.65 0.68 0.63  CALCIUM 8.8* 8.9 8.7*  GFRNONAA >60 >60 >60  GFRAA >60 >60 >60  PROT 6.4* 6.6 6.5  ALBUMIN 3.5 3.7 3.7  AST _1 ALT _2 ALKPHOS 118 114 137*  BILITOT 0.3 0.3 0.3     ASSESSMENT & PLAN:   .Cancer of ascending colon metastatic to intra-abdominal lymph node (HCC) # Colon cancer- cecal/ right-sided; STAGE IV [intraabdominal/retroperitonel LN/Supracla; K-ras mutated]. Currently on FOLFOX palliative chemotherapy+ avastin. Clinically no evidence of progression tolerating treatments fairly well.12/03/2015-  CT- near CR- significant response of the right supraclavicular  lymph node/ intra-abdominal lymph nodes/mediastinal adenopathy.  # Hypokalemia- from chemotherapy/ mild diarrhea. Potassium 40 mEq twice a day.   # weight loss- Referred to dietitian  # Diarrhea- grade 1 recommend Imodium  # Follow-up with me in 2 weeks with chemotherapy Avastin. Labs.# I reviewed the blood work- with the patient in detail; also reviewed the imaging independently [as summarized above]; and with the patient in detail.      Cammie Sickle, MD 12/07/2015 7:31 AM

## 2015-12-06 NOTE — Progress Notes (Signed)
Patient being treated with folfox/avastin today with question of insurance approval. Called and left message with financial office at 1050hrs. Called again at 1120hrs and 1135hrs. Unclear if treatment is approved by Ins at this time. Will not process orders until approved by insurance.

## 2015-12-07 DIAGNOSIS — Z5111 Encounter for antineoplastic chemotherapy: Secondary | ICD-10-CM | POA: Insufficient documentation

## 2015-12-08 ENCOUNTER — Inpatient Hospital Stay: Payer: 59

## 2015-12-08 VITALS — BP 127/76 | HR 75 | Temp 97.0°F | Resp 18

## 2015-12-08 DIAGNOSIS — C182 Malignant neoplasm of ascending colon: Secondary | ICD-10-CM

## 2015-12-08 DIAGNOSIS — Z87891 Personal history of nicotine dependence: Secondary | ICD-10-CM | POA: Diagnosis not present

## 2015-12-08 DIAGNOSIS — C772 Secondary and unspecified malignant neoplasm of intra-abdominal lymph nodes: Secondary | ICD-10-CM | POA: Diagnosis not present

## 2015-12-08 DIAGNOSIS — Z7689 Persons encountering health services in other specified circumstances: Secondary | ICD-10-CM | POA: Diagnosis not present

## 2015-12-08 DIAGNOSIS — Z9049 Acquired absence of other specified parts of digestive tract: Secondary | ICD-10-CM | POA: Diagnosis not present

## 2015-12-08 DIAGNOSIS — Z79899 Other long term (current) drug therapy: Secondary | ICD-10-CM | POA: Diagnosis not present

## 2015-12-08 DIAGNOSIS — Z800 Family history of malignant neoplasm of digestive organs: Secondary | ICD-10-CM | POA: Diagnosis not present

## 2015-12-08 DIAGNOSIS — Z5111 Encounter for antineoplastic chemotherapy: Secondary | ICD-10-CM | POA: Diagnosis not present

## 2015-12-08 DIAGNOSIS — Z8042 Family history of malignant neoplasm of prostate: Secondary | ICD-10-CM | POA: Diagnosis not present

## 2015-12-08 MED ORDER — PEGFILGRASTIM 6 MG/0.6ML ~~LOC~~ PSKT
6.0000 mg | PREFILLED_SYRINGE | Freq: Once | SUBCUTANEOUS | Status: DC
  Filled 2015-12-08: qty 0.6

## 2015-12-08 MED ORDER — SODIUM CHLORIDE 0.9% FLUSH
10.0000 mL | INTRAVENOUS | Status: DC | PRN
  Administered 2015-12-08: 10 mL
  Filled 2015-12-08: qty 10

## 2015-12-08 MED ORDER — HEPARIN SOD (PORK) LOCK FLUSH 100 UNIT/ML IV SOLN
INTRAVENOUS | Status: AC
  Filled 2015-12-08: qty 5

## 2015-12-08 MED ORDER — HEPARIN SOD (PORK) LOCK FLUSH 100 UNIT/ML IV SOLN
500.0000 [IU] | Freq: Once | INTRAVENOUS | Status: AC | PRN
  Administered 2015-12-08: 500 [IU]

## 2015-12-17 ENCOUNTER — Encounter: Payer: 59 | Attending: Internal Medicine | Admitting: Dietician

## 2015-12-17 ENCOUNTER — Encounter: Payer: Self-pay | Admitting: Dietician

## 2015-12-17 NOTE — Patient Instructions (Signed)
Add cheese to eggs at breakfast. Be generous with the margarine on your toast. Be generous with the mayonnaise on your sandwich at lunch. Continue to drink Gaterade protein shake as morning snack and evening snack. In addition drink 1/2 protein shake with lunch and 1/2 at dinner.  If unable to eat meat in the days after chemo, drink at 1 Premier protein shake in addition to [place] protein shake. Consider chicken and tuna salad to help add calories. Refer to Eating Hints booklet given for other suggestions discussed at today's visit.

## 2015-12-17 NOTE — Progress Notes (Signed)
Medical Nutrition Therapy: Visit start time: [id number]   end time:1410 Assessment:  Diagnosis: colon cancer   Current weight: 90.9 lbs  Height: 60 in Medications, supplements: see list Progress and evaluation:  Patient in for initial medical nutrition therapy assessment. She reports she weighed 115 lbs 05/2015. She has lost 8.3 lbs in the past 5 weeks. She reports she has significantly decreased appetite after her biweekly chemotherapy treatments and just as appetite improves, she has another chemo treatment. She is making an effort to eat 3 small meals and to drink Gaterade protein shake mid-morning and in the evening Her calorie needs are 1600-1700 calories to promote weight gain and she is sometimes able to meet this goal on 2nd week after chemo but not after the 1st week. Her supplement is providing 40 gms of protein daily of the 80 gms (minimum)  recommended. She also experiences frequent bowel movements, up to 4 per day.  Nutrition Care Education: Ways to increase calories and protein:  Discussed halting weight loss and eating/drinking adequate protein as priorities. Gave and reviewed "Eating Hints" on ways to add calories to foods she is already eating. Meal by meal made suggestions on ways to add calories without adding volume. Suggested goal of 70-80 gms of protein acknowledging that supplements are necessary to meet goal.   Nutritional Diagnosis:  Welda-3.2 Unintentional weight loss As related to decreased appetite due to chemo treatments.  As evidenced by diet history..  Intervention:  Add cheese to eggs at breakfast. Be generous with the margarine on your toast. Be generous with the mayonnaise on your sandwich at lunch. Continue to drink Gaterade protein shake as morning snack and evening snack. In addition drink 1/2 protein shake with lunch and 1/2 at dinner.  If unable to eat meat in the days after chemo, drink at 1 Premier protein shake in addition to [place] protein shake. Consider  chicken and tuna salad to help add calories. Refer to Eating Hints booklet given for other suggestions discussed at today's visit.  Education Materials given:  Marland Kitchen Eating Hints- Before, during and after cancer" . Sample of Premier protein supplement . Goals/ instructions Learner/ who was taught:  . Patient  Level of understanding: Marland Kitchen Verbalizes understanding Learning barriers: . None Willingness to learn/ readiness for change: . Acceptance, ready for change  Monitoring and Evaluation:  Dietary intake,  and body weight Follow-up- 01/10/16 at 3:00

## 2015-12-20 ENCOUNTER — Encounter (INDEPENDENT_AMBULATORY_CARE_PROVIDER_SITE_OTHER): Payer: Self-pay

## 2015-12-20 ENCOUNTER — Inpatient Hospital Stay: Payer: 59

## 2015-12-20 ENCOUNTER — Inpatient Hospital Stay (HOSPITAL_BASED_OUTPATIENT_CLINIC_OR_DEPARTMENT_OTHER): Payer: 59 | Admitting: Internal Medicine

## 2015-12-20 VITALS — BP 142/86 | HR 80 | Temp 96.6°F | Resp 18 | Wt 92.6 lb

## 2015-12-20 DIAGNOSIS — C182 Malignant neoplasm of ascending colon: Secondary | ICD-10-CM | POA: Diagnosis not present

## 2015-12-20 DIAGNOSIS — Z9049 Acquired absence of other specified parts of digestive tract: Secondary | ICD-10-CM | POA: Diagnosis not present

## 2015-12-20 DIAGNOSIS — C772 Secondary and unspecified malignant neoplasm of intra-abdominal lymph nodes: Secondary | ICD-10-CM

## 2015-12-20 DIAGNOSIS — Z79899 Other long term (current) drug therapy: Secondary | ICD-10-CM | POA: Diagnosis not present

## 2015-12-20 DIAGNOSIS — R634 Abnormal weight loss: Secondary | ICD-10-CM

## 2015-12-20 DIAGNOSIS — D649 Anemia, unspecified: Secondary | ICD-10-CM | POA: Diagnosis not present

## 2015-12-20 DIAGNOSIS — R630 Anorexia: Secondary | ICD-10-CM

## 2015-12-20 DIAGNOSIS — Z5111 Encounter for antineoplastic chemotherapy: Secondary | ICD-10-CM

## 2015-12-20 DIAGNOSIS — E876 Hypokalemia: Secondary | ICD-10-CM

## 2015-12-20 DIAGNOSIS — Z87891 Personal history of nicotine dependence: Secondary | ICD-10-CM | POA: Diagnosis not present

## 2015-12-20 DIAGNOSIS — Z800 Family history of malignant neoplasm of digestive organs: Secondary | ICD-10-CM | POA: Diagnosis not present

## 2015-12-20 DIAGNOSIS — R202 Paresthesia of skin: Secondary | ICD-10-CM

## 2015-12-20 DIAGNOSIS — R200 Anesthesia of skin: Secondary | ICD-10-CM

## 2015-12-20 DIAGNOSIS — Z7689 Persons encountering health services in other specified circumstances: Secondary | ICD-10-CM | POA: Diagnosis not present

## 2015-12-20 DIAGNOSIS — Z8042 Family history of malignant neoplasm of prostate: Secondary | ICD-10-CM | POA: Diagnosis not present

## 2015-12-20 LAB — COMPREHENSIVE METABOLIC PANEL
ALBUMIN: 4 g/dL (ref 3.5–5.0)
ALK PHOS: 126 U/L (ref 38–126)
ALT: 27 U/L (ref 14–54)
ANION GAP: 7 (ref 5–15)
AST: 26 U/L (ref 15–41)
BILIRUBIN TOTAL: 0.4 mg/dL (ref 0.3–1.2)
BUN: 17 mg/dL (ref 6–20)
CALCIUM: 8.8 mg/dL — AB (ref 8.9–10.3)
CO2: 21 mmol/L — AB (ref 22–32)
Chloride: 109 mmol/L (ref 101–111)
Creatinine, Ser: 0.69 mg/dL (ref 0.44–1.00)
GFR calc Af Amer: >60 mL/min (ref >60)
GFR calc non Af Amer: >60 mL/min (ref >60)
GLUCOSE: 115 mg/dL — AB (ref 65–99)
Potassium: 3.4 mmol/L — ABNORMAL LOW (ref 3.5–5.1)
SODIUM: 137 mmol/L (ref 135–145)
TOTAL PROTEIN: 6.9 g/dL (ref 6.5–8.1)

## 2015-12-20 LAB — CBC WITH DIFFERENTIAL/PLATELET
BASOS ABS: 0.1 10*3/uL (ref 0–0.1)
BASOS PCT: 0 %
EOS ABS: 0.1 10*3/uL (ref 0–0.7)
Eosinophils Relative: 1 %
HEMATOCRIT: 37.4 % (ref 35.0–47.0)
HEMOGLOBIN: 12.6 g/dL (ref 12.0–16.0)
Lymphocytes Relative: 14 %
Lymphs Abs: 1.9 10*3/uL (ref 1.0–3.6)
MCH: 31.7 pg (ref 26.0–34.0)
MCHC: 33.7 g/dL (ref 32.0–36.0)
MCV: 94.1 fL (ref 80.0–100.0)
MONOS PCT: 7 %
Monocytes Absolute: 0.9 10*3/uL (ref 0.2–0.9)
NEUTROS ABS: 10.7 10*3/uL — AB (ref 1.4–6.5)
NEUTROS PCT: 78 %
Platelets: 112 10*3/uL — ABNORMAL LOW (ref 150–440)
RBC: 3.98 MIL/uL (ref 3.80–5.20)
RDW: 25.2 % — ABNORMAL HIGH (ref 11.5–14.5)
WBC: 13.6 10*3/uL — AB (ref 3.6–11.0)

## 2015-12-20 MED ORDER — OXALIPLATIN CHEMO INJECTION 100 MG/20ML
85.0000 mg/m2 | Freq: Once | INTRAVENOUS | Status: AC
  Administered 2015-12-20: 120 mg via INTRAVENOUS
  Filled 2015-12-20: qty 20

## 2015-12-20 MED ORDER — SODIUM CHLORIDE 0.9% FLUSH
10.0000 mL | INTRAVENOUS | Status: DC | PRN
  Administered 2015-12-20: 10 mL
  Filled 2015-12-20: qty 10

## 2015-12-20 MED ORDER — DEXTROSE 5 % IV SOLN
Freq: Once | INTRAVENOUS | Status: AC
  Administered 2015-12-20: 12:00:00 via INTRAVENOUS
  Filled 2015-12-20: qty 1000

## 2015-12-20 MED ORDER — SODIUM CHLORIDE 0.9 % IV SOLN
10.0000 mg | Freq: Once | INTRAVENOUS | Status: AC
  Administered 2015-12-20: 10 mg via INTRAVENOUS
  Filled 2015-12-20: qty 1

## 2015-12-20 MED ORDER — SODIUM CHLORIDE 0.9 % IV SOLN
Freq: Once | INTRAVENOUS | Status: AC
  Administered 2015-12-20: 10:00:00 via INTRAVENOUS
  Filled 2015-12-20: qty 1000

## 2015-12-20 MED ORDER — FLUOROURACIL CHEMO INJECTION 2.5 GM/50ML
400.0000 mg/m2 | Freq: Once | INTRAVENOUS | Status: AC
  Administered 2015-12-20: 550 mg via INTRAVENOUS
  Filled 2015-12-20: qty 11

## 2015-12-20 MED ORDER — SODIUM CHLORIDE 0.9 % IV SOLN
2400.0000 mg/m2 | INTRAVENOUS | Status: DC
  Administered 2015-12-20: 3350 mg via INTRAVENOUS
  Filled 2015-12-20: qty 67

## 2015-12-20 MED ORDER — PALONOSETRON HCL INJECTION 0.25 MG/5ML
0.2500 mg | Freq: Once | INTRAVENOUS | Status: AC
  Administered 2015-12-20: 0.25 mg via INTRAVENOUS
  Filled 2015-12-20: qty 5

## 2015-12-20 MED ORDER — BEVACIZUMAB CHEMO INJECTION 400 MG/16ML
5.0000 mg/kg | Freq: Once | INTRAVENOUS | Status: AC
  Administered 2015-12-20: 225 mg via INTRAVENOUS
  Filled 2015-12-20: qty 9

## 2015-12-20 MED ORDER — DEXTROSE 5 % IV SOLN
550.0000 mg | Freq: Once | INTRAVENOUS | Status: AC
  Administered 2015-12-20: 550 mg via INTRAVENOUS
  Filled 2015-12-20: qty 25

## 2015-12-20 MED ORDER — DEXTROSE 5 % IV SOLN: 550.0000 mg/m2 | Freq: Once | INTRAVENOUS | Status: DC

## 2015-12-20 NOTE — Assessment & Plan Note (Addendum)
#  Colon cancer- cecal/ right-sided; STAGE IV [intraabdominal/retroperitonel LN/Supracla; K-ras mutated]. Currently on FOLFOX palliative chemotherapy+ avastin. Clinically no evidence of progression tolerating treatments fairly well.12/03/2015-  CT- near CR- significant response of the right supraclavicular lymph node/ intra-abdominal lymph nodes/mediastinal adenopathy.  # Labs reviewed; unremarkable except for mild anemia/hypokalemia; proceed with chemotherapy Avastin today. Will plan to hold oxaliplatin moving forward with next cycle of chemo.   # Hypokalemia- from chemotherapy/ mild diarrhea- improved; 3.4- continue- Potassium 40 mEq twice a day.   # weight loss- met with dietitian last week.   # PN- grade 1.   # Diarrhea- grade 1 - improved on Imodium  # Follow-up with me in 2 weeks with chemotherapy Avastin.

## 2015-12-20 NOTE — Progress Notes (Signed)
Nancy Clark NOTE  Patient Care Team: Coral Spikes, DO as PCP - General (Family Medicine)  CHIEF COMPLAINTS/PURPOSE OF CONSULTATION:   Oncology History   # [facility] 2017- COLON CANCER- STAGE IV [s/p right hemi-colectomy; pT4apN2 (7/19LN)]; Pre-op CEA- [id number]  PET-  mesenteric adenopathy/mediastinal adenopathy/right-sided subclavicular lymph node.    April 17th -START FOLFOX; May 1st Add Avastin; 12/03/15-CT C/A/P-near CR    # MOLECULAR TESTING: K-ras-EXON 2- MUTATED; MSI- STABLE.      Cancer of ascending colon metastatic to intra-abdominal lymph node ([place])   09/06/2015 Initial Diagnosis    Cancer of ascending colon metastatic to intra-abdominal lymph node (HCC)       HISTORY OF PRESENTING ILLNESS:  Nancy Clark 55 y.o.  female  with a diagnosed Metastatic right-sided colon cancer status post right hemicolectomy currently on palliative FOLFOX plus Avastin.  Continues to have mild weight loss; complains of poor appetite. Met with the dietitian.  Mild- mod tingling and numbness of the extremities.  No fever no chills. Denies any significant diarrhea.  ROS: A complete 10 point review of system is done which is negative except mentioned above in history of present illness  MEDICAL HISTORY:  Past Medical History:  Diagnosis Date  . Chicken pox   . Colon cancer [facility])     SURGICAL HISTORY: Past Surgical History:  Procedure Laterality Date  . BREAST BIOPSY Right    2007? pt unsure done by [facility]  . COLONOSCOPY    . COLONOSCOPY WITH PROPOFOL N/A 10/14/2015   Procedure: COLONOSCOPY WITH PROPOFOL;  Surgeon: Hulen Luster, MD;  Location: [facility] ENDOSCOPY;  Service: Endoscopy;  Laterality: N/A;  . COLOSTOMY REVISION Right 08/09/2015   Procedure: COLON RESECTION RIGHT;  Surgeon: Florene Glen, MD;  Location: [facility] ORS;  Service: General;  Laterality: Right;  . ECTOPIC PREGNANCY SURGERY    . PORTACATH PLACEMENT N/A 09/01/2015   Procedure: INSERTION PORT-A-CATH;   Surgeon: Florene Glen, MD;  Location: [facility] ORS;  Service: General;  Laterality: N/A;  . 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  1 pack in 3 days; no alcohol. 2 childern [25 & 17 years] Social History   Social History  . Marital status: Married    Spouse name: N/A  . Number of children: N/A  . Years of education: N/A   Occupational History  . Not on file.   Social History Main Topics  . Smoking status: Former Smoker    Packs/day: 0.25    Years: 2.00  . Smokeless tobacco: Never Used     Comment: recently quit  . Alcohol use No  . Drug use: No  . Sexual activity: Yes   Other Topics Concern  . Not on file   Social History Narrative  . No narrative on file    FAMILY HISTORY: 1 brother& 1 sister- no cancer. Mom-no cancer Family History  Problem Relation Age of Onset  . Hypertension Mother   . Arthritis Father   . Prostate cancer Father   . Diabetes Maternal Grandmother   . Colon cancer Maternal Grandfather     colon    ALLERGIES:  has No Known Allergies.  MEDICATIONS:  Current Outpatient Prescriptions  Medication Sig Dispense Refill  . acetaminophen (TYLENOL) 325 MG tablet Take 2 tablets (650 mg total) by mouth every 6 (six) hours as needed for mild pain (or Fever >/= 101).    . feeding supplement, ENSURE ENLIVE, (ENSURE ENLIVE) LIQD Take  237 mLs by mouth 2 (two) times daily between meals. 60 Bottle 3  . ferrous sulfate (FERROUSUL) 325 (65 FE) MG tablet Take 1 tablet (325 mg total) by mouth daily with breakfast. 90 tablet 3  . HYDROcodone-acetaminophen (NORCO/VICODIN) 5-325 MG tablet Take 1 tablet by mouth every 6 (six) hours as needed for moderate pain or severe pain. 25 tablet 0  . lidocaine-prilocaine (EMLA) cream Apply 1 application topically as needed. Apply generously over the Mediport 45 minutes prior to chemotherapy. 30 g 0  . Multiple Vitamins-Minerals (MULTIVITAMIN ADULT PO) Take 1 tablet by mouth daily.    .  ondansetron (ZOFRAN) 8 MG tablet Take 1 tablet (8 mg total) by mouth every 8 (eight) hours as needed for nausea or vomiting (start 3 days; after chemo). 40 tablet 0  . potassium chloride SA (K-DUR,KLOR-CON) 20 MEQ tablet Take 2 tablets (40 mEq total) by mouth 2 (two) times daily. 30 tablet 3  . prochlorperazine (COMPAZINE) 10 MG tablet Take 1 tablet (10 mg total) by mouth every 6 (six) hours as needed for nausea or vomiting. 30 tablet 0   No current facility-administered medications for this visit.    Facility-Administered Medications Ordered in Other Visits  Medication Dose Route Frequency Provider Last Rate Last Dose  . sodium chloride flush (NS) 0.9 % injection 10 mL  10 mL Intravenous PRN Cammie Sickle, MD   10 mL at 10/04/15 0901      .  PHYSICAL EXAMINATION: ECOG PERFORMANCE STATUS: 0 - Asymptomatic  Vitals:   12/20/15 0919  BP: (!) 142/86  Pulse: 80  Resp: 18  Temp: (!) 96.6 F (35.9 C)   Filed Weights   12/20/15 0919  Weight: 92 lb 9 oz (42 kg)    GENERAL: Well-nourished well-developed; thin built; Alert, no distress and comfortable.   Accompanied by her mother.  EYES: no pallor or icterus OROPHARYNX: no thrush or ulceration; good dentition  NECK: supple, no masses felt LYMPH:  no palpable lymphadenopathy in the cervical, axillary or inguinal regions LUNGS: clear to auscultation and  No wheeze or crackles HEART/CVS: regular rate & rhythm and no murmurs; No lower extremity edema ABDOMEN: abdomen soft, non-tender and normal bowel sounds; surgical incisions noted. No infections. Musculoskeletal:no cyanosis of digits and no clubbing  PSYCH: alert & oriented x 3 with fluent speech NEURO: no focal motor/sensory deficits SKIN:  no rashes or significant lesions  LABORATORY DATA:  I have reviewed the data as listed Lab Results  Component Value Date   WBC 13.6 (H) 12/20/2015   HGB 12.6 12/20/2015   HCT 37.4 12/20/2015   MCV 94.1 12/20/2015   PLT 112 (L)  12/20/2015    Recent Labs  11/24/15 0918 12/06/15 0918 12/20/15 0900  NA 137 136 137  K 4.1 2.8* 3.4*  CL 112* 108 109  CO2 20* 22 21*  GLUCOSE 102* 103* 115*  BUN 11 6 17   CREATININE 0.68 0.63 0.69  CALCIUM 8.9 8.7* 8.8*  GFRNONAA >60 >60 >60  GFRAA >60 >60 >60  PROT 6.6 6.5 6.9  ALBUMIN 3.7 3.7 4.0  AST 21 20 26   ALT 21 14 27   ALKPHOS 114 137* 126  BILITOT 0.3 0.3 0.4     ASSESSMENT & PLAN:   .Cancer of ascending colon metastatic to intra-abdominal lymph node (HCC) # Colon cancer- cecal/ right-sided; STAGE IV [intraabdominal/retroperitonel LN/Supracla; K-ras mutated]. Currently on FOLFOX palliative chemotherapy+ avastin. Clinically no evidence of progression tolerating treatments fairly well.12/03/2015-  CT- near CR- significant response  of the right supraclavicular lymph node/ intra-abdominal lymph nodes/mediastinal adenopathy.  # Labs reviewed; unremarkable except for mild anemia/hypokalemia; proceed with chemotherapy Avastin today. Will plan to hold oxaliplatin moving forward with next cycle of chemo.   # Hypokalemia- from chemotherapy/ mild diarrhea- improved; 3.4- continue- Potassium 40 mEq twice a day.   # weight loss- met with dietitian last week.   # PN- grade 1.   # Diarrhea- grade 1 - improved on Imodium  # Follow-up with me in 2 weeks with chemotherapy Avastin.     Cammie Sickle, MD 12/22/2015 6:23 PM

## 2015-12-22 ENCOUNTER — Inpatient Hospital Stay: Payer: 59 | Attending: Internal Medicine

## 2015-12-22 DIAGNOSIS — Z7689 Persons encountering health services in other specified circumstances: Secondary | ICD-10-CM | POA: Insufficient documentation

## 2015-12-22 DIAGNOSIS — Z79899 Other long term (current) drug therapy: Secondary | ICD-10-CM | POA: Diagnosis not present

## 2015-12-22 DIAGNOSIS — Z87891 Personal history of nicotine dependence: Secondary | ICD-10-CM | POA: Diagnosis not present

## 2015-12-22 DIAGNOSIS — R197 Diarrhea, unspecified: Secondary | ICD-10-CM | POA: Insufficient documentation

## 2015-12-22 DIAGNOSIS — E876 Hypokalemia: Secondary | ICD-10-CM | POA: Insufficient documentation

## 2015-12-22 DIAGNOSIS — Z800 Family history of malignant neoplasm of digestive organs: Secondary | ICD-10-CM | POA: Insufficient documentation

## 2015-12-22 DIAGNOSIS — C772 Secondary and unspecified malignant neoplasm of intra-abdominal lymph nodes: Secondary | ICD-10-CM | POA: Diagnosis not present

## 2015-12-22 DIAGNOSIS — Z8042 Family history of malignant neoplasm of prostate: Secondary | ICD-10-CM | POA: Insufficient documentation

## 2015-12-22 DIAGNOSIS — Z5112 Encounter for antineoplastic immunotherapy: Secondary | ICD-10-CM | POA: Insufficient documentation

## 2015-12-22 DIAGNOSIS — C182 Malignant neoplasm of ascending colon: Secondary | ICD-10-CM | POA: Insufficient documentation

## 2015-12-22 DIAGNOSIS — R200 Anesthesia of skin: Secondary | ICD-10-CM | POA: Insufficient documentation

## 2015-12-22 DIAGNOSIS — Z9049 Acquired absence of other specified parts of digestive tract: Secondary | ICD-10-CM | POA: Insufficient documentation

## 2015-12-22 MED ORDER — HEPARIN SOD (PORK) LOCK FLUSH 100 UNIT/ML IV SOLN
500.0000 [IU] | Freq: Once | INTRAVENOUS | Status: AC | PRN
  Administered 2015-12-22: 500 [IU]

## 2015-12-22 MED ORDER — PEGFILGRASTIM 6 MG/0.6ML ~~LOC~~ PSKT
6.0000 mg | PREFILLED_SYRINGE | Freq: Once | SUBCUTANEOUS | Status: AC
  Administered 2015-12-22: 6 mg via SUBCUTANEOUS
  Filled 2015-12-22: qty 0.6

## 2015-12-22 MED ORDER — HEPARIN SOD (PORK) LOCK FLUSH 100 UNIT/ML IV SOLN
INTRAVENOUS | Status: AC
  Filled 2015-12-22: qty 5

## 2016-01-03 ENCOUNTER — Other Ambulatory Visit: Payer: 59

## 2016-01-03 ENCOUNTER — Ambulatory Visit: Payer: 59

## 2016-01-03 ENCOUNTER — Ambulatory Visit: Payer: 59 | Admitting: Internal Medicine

## 2016-01-05 ENCOUNTER — Inpatient Hospital Stay (HOSPITAL_BASED_OUTPATIENT_CLINIC_OR_DEPARTMENT_OTHER): Payer: 59 | Admitting: Internal Medicine

## 2016-01-05 ENCOUNTER — Inpatient Hospital Stay: Payer: 59

## 2016-01-05 VITALS — BP 131/81 | HR 76 | Temp 96.5°F | Resp 18 | Wt 93.4 lb

## 2016-01-05 DIAGNOSIS — R197 Diarrhea, unspecified: Secondary | ICD-10-CM

## 2016-01-05 DIAGNOSIS — Z9049 Acquired absence of other specified parts of digestive tract: Secondary | ICD-10-CM

## 2016-01-05 DIAGNOSIS — C182 Malignant neoplasm of ascending colon: Secondary | ICD-10-CM | POA: Diagnosis not present

## 2016-01-05 DIAGNOSIS — Z79899 Other long term (current) drug therapy: Secondary | ICD-10-CM

## 2016-01-05 DIAGNOSIS — Z7689 Persons encountering health services in other specified circumstances: Secondary | ICD-10-CM | POA: Diagnosis not present

## 2016-01-05 DIAGNOSIS — R200 Anesthesia of skin: Secondary | ICD-10-CM

## 2016-01-05 DIAGNOSIS — C772 Secondary and unspecified malignant neoplasm of intra-abdominal lymph nodes: Principal | ICD-10-CM

## 2016-01-05 DIAGNOSIS — E876 Hypokalemia: Secondary | ICD-10-CM

## 2016-01-05 DIAGNOSIS — Z5112 Encounter for antineoplastic immunotherapy: Secondary | ICD-10-CM | POA: Diagnosis not present

## 2016-01-05 DIAGNOSIS — Z87891 Personal history of nicotine dependence: Secondary | ICD-10-CM

## 2016-01-05 LAB — COMPREHENSIVE METABOLIC PANEL
ALBUMIN: 3.6 g/dL (ref 3.5–5.0)
ALK PHOS: 100 U/L (ref 38–126)
ALT: 42 U/L (ref 14–54)
AST: 40 U/L (ref 15–41)
Anion gap: 6 (ref 5–15)
BUN: 11 mg/dL (ref 6–20)
CALCIUM: 8.6 mg/dL — AB (ref 8.9–10.3)
CHLORIDE: 112 mmol/L — AB (ref 101–111)
CO2: 20 mmol/L — AB (ref 22–32)
CREATININE: 0.67 mg/dL (ref 0.44–1.00)
Glucose, Bld: 115 mg/dL — ABNORMAL HIGH (ref 65–99)
Potassium: 3.8 mmol/L (ref 3.5–5.1)
Sodium: 138 mmol/L (ref 135–145)
TOTAL PROTEIN: 6.5 g/dL (ref 6.5–8.1)
Total Bilirubin: 0.5 mg/dL (ref 0.3–1.2)

## 2016-01-05 LAB — CBC WITH DIFFERENTIAL/PLATELET
BASOS ABS: 0.1 10*3/uL (ref 0–0.1)
Eosinophils Absolute: 0 10*3/uL (ref 0–0.7)
HCT: 36.6 % (ref 35.0–47.0)
Hemoglobin: 12.3 g/dL (ref 12.0–16.0)
Lymphocytes Relative: 16 %
Lymphs Abs: 1.6 10*3/uL (ref 1.0–3.6)
MCH: 32.9 pg (ref 26.0–34.0)
MCHC: 33.6 g/dL (ref 32.0–36.0)
MCV: 97.8 fL (ref 80.0–100.0)
MONO ABS: 1 10*3/uL — AB (ref 0.2–0.9)
Monocytes Relative: 9 %
NEUTROS ABS: 7.7 10*3/uL — AB (ref 1.4–6.5)
Neutrophils Relative %: 74 %
PLATELETS: 68 10*3/uL — AB (ref 150–440)
RBC: 3.74 MIL/uL — ABNORMAL LOW (ref 3.80–5.20)
RDW: 23.1 % — AB (ref 11.5–14.5)
WBC: 10.4 10*3/uL (ref 3.6–11.0)

## 2016-01-05 MED ORDER — SODIUM CHLORIDE 0.9% FLUSH
10.0000 mL | Freq: Once | INTRAVENOUS | Status: AC
  Administered 2016-01-05: 10 mL via INTRAVENOUS
  Filled 2016-01-05: qty 10

## 2016-01-05 MED ORDER — LEUCOVORIN CALCIUM INJECTION 350 MG: 400.0000 mg/m2 | Freq: Once | INTRAVENOUS | Status: DC

## 2016-01-05 MED ORDER — SODIUM CHLORIDE 0.9 % IV SOLN
Freq: Once | INTRAVENOUS | Status: AC
  Administered 2016-01-05: 10:00:00 via INTRAVENOUS
  Filled 2016-01-05: qty 1000

## 2016-01-05 MED ORDER — FLUOROURACIL CHEMO INJECTION 2.5 GM/50ML
400.0000 mg/m2 | Freq: Once | INTRAVENOUS | Status: AC
  Administered 2016-01-05: 550 mg via INTRAVENOUS
  Filled 2016-01-05: qty 11

## 2016-01-05 MED ORDER — HEPARIN SOD (PORK) LOCK FLUSH 100 UNIT/ML IV SOLN: 500.0000 [IU] | Freq: Once | INTRAVENOUS | Status: DC

## 2016-01-05 MED ORDER — LEUCOVORIN CALCIUM INJECTION 100 MG
20.0000 mg/m2 | Freq: Once | INTRAMUSCULAR | Status: AC
  Administered 2016-01-05: 26 mg via INTRAVENOUS
  Filled 2016-01-05: qty 1.3

## 2016-01-05 MED ORDER — POTASSIUM CHLORIDE CRYS ER 20 MEQ PO TBCR: 40.0000 meq | EXTENDED_RELEASE_TABLET | Freq: Two times a day (BID) | ORAL | 3 refills | Status: DC

## 2016-01-05 MED ORDER — SODIUM CHLORIDE 0.9 % IV SOLN
2400.0000 mg/m2 | INTRAVENOUS | Status: DC
  Administered 2016-01-05: 3350 mg via INTRAVENOUS
  Filled 2016-01-05: qty 67

## 2016-01-05 MED ORDER — BEVACIZUMAB CHEMO INJECTION 400 MG/16ML
5.0000 mg/kg | Freq: Once | INTRAVENOUS | Status: AC
  Administered 2016-01-05: 225 mg via INTRAVENOUS
  Filled 2016-01-05: qty 9

## 2016-01-05 MED ORDER — SODIUM CHLORIDE 0.9 % IV SOLN
Freq: Once | INTRAVENOUS | Status: AC
  Administered 2016-01-05: 8 mg via INTRAVENOUS
  Filled 2016-01-05: qty 4

## 2016-01-05 MED ORDER — DEXTROSE 5 % IV SOLN
Freq: Once | INTRAVENOUS | Status: DC
  Filled 2016-01-05: qty 1000

## 2016-01-05 NOTE — Progress Notes (Signed)
[place] NOTE  Patient Care Team: Coral Spikes, DO as PCP - General (Family Medicine)  CHIEF COMPLAINTS/PURPOSE OF CONSULTATION:   Oncology History   # [facility] 2017- COLON CANCER- STAGE IV [s/p right hemi-colectomy; pT4apN2 (7/19LN)]; Pre-op CEA- [id number]  PET-  mesenteric adenopathy/mediastinal adenopathy/right-sided subclavicular lymph node.    April 17th -START FOLFOX; May 1st Add Avastin; 12/03/15-CT C/A/P-near CR  Aug 16th- Disc OX [sec PN]; con 5FU-Avastin   # MOLECULAR TESTING: K-ras-EXON 2- MUTATED; MSI- STABLE.      Cancer of ascending colon metastatic to intra-abdominal lymph node ([place])   09/06/2015 Initial Diagnosis    Cancer of ascending colon metastatic to intra-abdominal lymph node (HCC)       HISTORY OF PRESENTING ILLNESS:  Nancy Clark 55 y.o.  female  with a diagnosed Metastatic right-sided colon cancer status post right hemicolectomy currently on palliative FOLFOX plus Avastin.  Patient continues to have tingling and numbness of extremities. Otherwise denies any nausea vomiting. Appetite is improving.  No fever no chills. Denies any significant diarrhea. Denies any headaches. Denies any vision changes. Denies any epistaxis.  ROS: A complete 10 point review of system is done which is negative except mentioned above in history of present illness  MEDICAL HISTORY:  Past Medical History:  Diagnosis Date  . Chicken pox   . Colon cancer [facility])     SURGICAL HISTORY: Past Surgical History:  Procedure Laterality Date  . BREAST BIOPSY Right    2007? pt unsure done by [facility]  . COLONOSCOPY    . COLONOSCOPY WITH PROPOFOL N/A 10/14/2015   Procedure: COLONOSCOPY WITH PROPOFOL;  Surgeon: Hulen Luster, MD;  Location: [facility] ENDOSCOPY;  Service: Endoscopy;  Laterality: N/A;  . COLOSTOMY REVISION Right 08/09/2015   Procedure: COLON RESECTION RIGHT;  Surgeon: Florene Glen, MD;  Location: [facility] ORS;  Service: General;  Laterality: Right;  .  ECTOPIC PREGNANCY SURGERY    . PORTACATH PLACEMENT N/A 09/01/2015   Procedure: INSERTION PORT-A-CATH;  Surgeon: Florene Glen, MD;  Location: [facility] ORS;  Service: General;  Laterality: N/A;  . SHOULDER SURGERY Left     SOCIAL HISTORY: [place]; enviormental in [place].  1 pack in 3 days; no alcohol. 2 childern [25 & 17 years] Social History   Social History  . Marital status: Married    Spouse name: N/A  . Number of children: N/A  . Years of education: N/A   Occupational History  . Not on file.   Social History Main Topics  . Smoking status: Former Smoker    Packs/day: 0.25    Years: 2.00  . Smokeless tobacco: Never Used     Comment: recently quit  . Alcohol use No  . Drug use: No  . Sexual activity: Yes   Other Topics Concern  . Not on file   Social History Narrative  . No narrative on file    FAMILY HISTORY: 1 brother& 1 sister- no cancer. Mom-no cancer Family History  Problem Relation Age of Onset  . Hypertension Mother   . Arthritis Father   . Prostate cancer Father   . Diabetes Maternal Grandmother   . Colon cancer Maternal Grandfather     colon    ALLERGIES:  has No Known Allergies.  MEDICATIONS:  Current Outpatient Prescriptions  Medication Sig Dispense Refill  . acetaminophen (TYLENOL) 325 MG tablet Take 2 tablets (650 mg total) by mouth every 6 (six) hours as needed for mild pain (or Fever >/= 101).    Marland Kitchen  feeding supplement, ENSURE ENLIVE, (ENSURE ENLIVE) LIQD Take 237 mLs by mouth 2 (two) times daily between meals. 60 Bottle 3  . ferrous sulfate (FERROUSUL) 325 (65 FE) MG tablet Take 1 tablet (325 mg total) by mouth daily with breakfast. 90 tablet 3  . HYDROcodone-acetaminophen (NORCO/VICODIN) 5-325 MG tablet Take 1 tablet by mouth every 6 (six) hours as needed for moderate pain or severe pain. 25 tablet 0  . lidocaine-prilocaine (EMLA) cream Apply 1 application topically as needed. Apply generously over the Mediport 45 minutes prior to  chemotherapy. 30 g 0  . Multiple Vitamins-Minerals (MULTIVITAMIN ADULT PO) Take 1 tablet by mouth daily.    . ondansetron (ZOFRAN) 8 MG tablet Take 1 tablet (8 mg total) by mouth every 8 (eight) hours as needed for nausea or vomiting (start 3 days; after chemo). 40 tablet 0  . potassium chloride SA (K-DUR,KLOR-CON) 20 MEQ tablet Take 2 tablets (40 mEq total) by mouth 2 (two) times daily. 60 tablet 3  . prochlorperazine (COMPAZINE) 10 MG tablet Take 1 tablet (10 mg total) by mouth every 6 (six) hours as needed for nausea or vomiting. 30 tablet 0   No current facility-administered medications for this visit.    Facility-Administered Medications Ordered in Other Visits  Medication Dose Route Frequency Provider Last Rate Last Dose  . sodium chloride flush (NS) 0.9 % injection 10 mL  10 mL Intravenous PRN Cammie Sickle, MD   10 mL at 10/04/15 0901      .  PHYSICAL EXAMINATION: ECOG PERFORMANCE STATUS: 0 - Asymptomatic  Vitals:   01/05/16 0908 01/05/16 0909  BP: 131/81   Pulse: 76   Resp:  18  Temp: (!) 96.5 F (35.8 C)    Filed Weights   01/05/16 0908  Weight: 93 lb 7 oz (42.4 kg)    GENERAL: Well-nourished well-developed; thin built; Alert, no distress and comfortable.   Accompanied by her mother.  EYES: no pallor or icterus OROPHARYNX: no thrush or ulceration; good dentition  NECK: supple, no masses felt LYMPH:  no palpable lymphadenopathy in the cervical, axillary or inguinal regions LUNGS: clear to auscultation and  No wheeze or crackles HEART/CVS: regular rate & rhythm and no murmurs; No lower extremity edema ABDOMEN: abdomen soft, non-tender and normal bowel sounds; surgical incisions noted. No infections. Musculoskeletal:no cyanosis of digits and no clubbing  PSYCH: alert & oriented x 3 with fluent speech NEURO: no focal motor/sensory deficits SKIN:  no rashes or significant lesions  LABORATORY DATA:  I have reviewed the data as listed Lab Results  Component  Value Date   WBC 10.4 01/05/2016   HGB 12.3 01/05/2016   HCT 36.6 01/05/2016   MCV 97.8 01/05/2016   PLT 68 (L) 01/05/2016    Recent Labs  12/06/15 0918 12/20/15 0900 01/05/16 0853  NA 136 137 138  K 2.8* 3.4* 3.8  CL 108 109 112*  CO2 22 21* 20*  GLUCOSE 103* 115* 115*  BUN 6 17 11   CREATININE 0.63 0.69 0.67  CALCIUM 8.7* 8.8* 8.6*  GFRNONAA >60 >60 >60  GFRAA >60 >60 >60  PROT 6.5 6.9 6.5  ALBUMIN 3.7 4.0 3.6  AST 20 26 40  ALT 14 27 42  ALKPHOS 137* 126 100  BILITOT 0.3 0.4 0.5     ASSESSMENT & PLAN:   .Cancer of ascending colon metastatic to intra-abdominal lymph node (HCC) # Colon cancer- cecal/ right-sided; STAGE IV [intraabdominal/retroperitonel LN/Supracla; K-ras mutated]. Currently on FOLFOX palliative chemotherapy+ avastin. Clinically no evidence  of progression tolerating treatments fairly well.  # Labs - platelets- 68; discontinue oxaliplatin. Continue 5FU + Vastin q 2 weeks.    # Hypokalemia- from chemotherapy/ mild diarrhea- improved; 3.4- continue- Potassium 40 mEq twice a day.   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   # Diarrhea- grade 1 - improved on Imodium  # Follow-up with me in 2 weeks with chemotherapy Avastin.     Cammie Sickle, MD 01/05/2016 7:46 PM

## 2016-01-05 NOTE — Assessment & Plan Note (Signed)
#  Colon cancer- cecal/ right-sided; STAGE IV [intraabdominal/retroperitonel LN/Supracla; K-ras mutated]. Currently on FOLFOX palliative chemotherapy+ avastin. Clinically no evidence of progression tolerating treatments fairly well.  # Labs - platelets- 68; discontinue oxaliplatin. Continue 5FU + Vastin q 2 weeks.    # Hypokalemia- from chemotherapy/ mild diarrhea- improved; 3.4- continue- Potassium 40 mEq twice a day.   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   # Diarrhea- grade 1 - improved on Imodium  # Follow-up with me in 2 weeks with chemotherapy Avastin.

## 2016-01-06 DIAGNOSIS — C182 Malignant neoplasm of ascending colon: Secondary | ICD-10-CM | POA: Diagnosis not present

## 2016-01-07 ENCOUNTER — Inpatient Hospital Stay: Payer: 59

## 2016-01-07 DIAGNOSIS — Z9049 Acquired absence of other specified parts of digestive tract: Secondary | ICD-10-CM | POA: Diagnosis not present

## 2016-01-07 DIAGNOSIS — Z79899 Other long term (current) drug therapy: Secondary | ICD-10-CM | POA: Diagnosis not present

## 2016-01-07 DIAGNOSIS — E876 Hypokalemia: Secondary | ICD-10-CM | POA: Diagnosis not present

## 2016-01-07 DIAGNOSIS — C182 Malignant neoplasm of ascending colon: Secondary | ICD-10-CM | POA: Diagnosis not present

## 2016-01-07 DIAGNOSIS — R200 Anesthesia of skin: Secondary | ICD-10-CM | POA: Diagnosis not present

## 2016-01-07 DIAGNOSIS — C772 Secondary and unspecified malignant neoplasm of intra-abdominal lymph nodes: Principal | ICD-10-CM

## 2016-01-07 DIAGNOSIS — Z5112 Encounter for antineoplastic immunotherapy: Secondary | ICD-10-CM | POA: Diagnosis not present

## 2016-01-07 DIAGNOSIS — R197 Diarrhea, unspecified: Secondary | ICD-10-CM | POA: Diagnosis not present

## 2016-01-07 DIAGNOSIS — Z7689 Persons encountering health services in other specified circumstances: Secondary | ICD-10-CM | POA: Diagnosis not present

## 2016-01-07 MED ORDER — PEGFILGRASTIM 6 MG/0.6ML ~~LOC~~ PSKT
6.0000 mg | PREFILLED_SYRINGE | Freq: Once | SUBCUTANEOUS | Status: AC
  Administered 2016-01-07: 6 mg via SUBCUTANEOUS
  Filled 2016-01-07: qty 0.6

## 2016-01-07 MED ORDER — HEPARIN SOD (PORK) LOCK FLUSH 100 UNIT/ML IV SOLN
500.0000 [IU] | Freq: Once | INTRAVENOUS | Status: AC | PRN
  Administered 2016-01-07: 500 [IU]
  Filled 2016-01-07: qty 5

## 2016-01-10 ENCOUNTER — Encounter: Payer: 59 | Attending: Internal Medicine | Admitting: Dietician

## 2016-01-10 NOTE — Patient Instructions (Signed)
Continue with 3 servings of gaterade protein to add 60 gms to diet. If unable to eat a meal, try Boost sample given or Boost Compact for something different in place of meal. Continue to add margarine, mayonnaise and ranch dressing to any foods that you can tolerate. Strive for at least 1 1/2 to 2 oz. meat portion.

## 2016-01-10 NOTE — Progress Notes (Signed)
Medical Nutrition Therapy Follow-up visit:  Time with patient: [id number] Visit #:2 ASSESSMENT:  Diagnosis:colon cancer; weight loss  Current weight: 91.1 lbs    Height: 60 Medications: See list Progress and evaluation:Patient in for medical nutrition therapy follow-up. She has increased Gaterade protein shake to 3 per day which adds 60 gms protein to her diet and approximately 800 calories.She is drinking the shake between her meals.  She reports her taste continues to be altered and she has difficulty eating any meats other than chicken and she can only eat 1 to 1.5 ounces at one time.She now adds cheese to her eggs for breakfast with toast and sometimes grits with margarine. He lunch is typically a sandwich or soup with crackers. Her dinner last night was broccoli/cheese soup with crackers.  She sometimes skips a meal due to poor appetite; typically 2-3 times per week. She has been able to maintain her weight in the last 3.5 weeks.    NUTRITION CARE EDUCATION: Ways to increase calories and protein: Commended patient on taking steps to increase her calories and protein even though her taste is altered and she does not have a good appetite especially after chemo treatments. Gave sample of Boost Compact in case she cannot eat a meal which contains 240 calories in 4 oz verses 8 oz. In case less volume makes it easier. Also gave Boost Simply Complete and Boost high calorie supplement to try. Discussed again ways to add calories without adding significant volume and encouraged to continue with consistent pattern of eating 3 meals with supplements between meals.  INTERVENTION:  Continue with 3 meals and nutritional supplements as snacks in between meals. Continue with 3 servings of gaterade protein shake to add 60 gms to diet. If unable to eat a meal, try Boost sample given or Boost Compact for something different in place of meal. Continue to add margarine, mayonnaise and ranch dressing to any  foods that you can tolerate. Strive for at least 1 1/2 to 2 oz.chicken  portion.   EDUCATION MATERIALS GIVEN:  . Samples of nutritional supplements  . Goals/ instructions  LEARNER/ who was taught:  . Patient  LEVEL OF UNDERSTANDING: . Verbalizes understanding LEARNING BARRIERS: . None WILLINGNESS TO LEARN/READINESS FOR CHANGE: . Eager, change in progress . Acceptance, ready for change . Hesitance, contemplating change . Non-acceptance, not ready for change  MONITORING AND EVALUATION:  02/08/16 at 10:30am

## 2016-01-17 ENCOUNTER — Ambulatory Visit: Payer: 59 | Admitting: Dietician

## 2016-01-19 ENCOUNTER — Inpatient Hospital Stay (HOSPITAL_BASED_OUTPATIENT_CLINIC_OR_DEPARTMENT_OTHER): Payer: 59 | Admitting: Internal Medicine

## 2016-01-19 ENCOUNTER — Inpatient Hospital Stay: Payer: 59

## 2016-01-19 VITALS — BP 135/84 | HR 72 | Temp 96.8°F | Resp 18 | Wt 92.4 lb

## 2016-01-19 DIAGNOSIS — R200 Anesthesia of skin: Secondary | ICD-10-CM

## 2016-01-19 DIAGNOSIS — C182 Malignant neoplasm of ascending colon: Secondary | ICD-10-CM

## 2016-01-19 DIAGNOSIS — Z800 Family history of malignant neoplasm of digestive organs: Secondary | ICD-10-CM

## 2016-01-19 DIAGNOSIS — Z79899 Other long term (current) drug therapy: Secondary | ICD-10-CM | POA: Diagnosis not present

## 2016-01-19 DIAGNOSIS — R197 Diarrhea, unspecified: Secondary | ICD-10-CM

## 2016-01-19 DIAGNOSIS — Z9049 Acquired absence of other specified parts of digestive tract: Secondary | ICD-10-CM | POA: Diagnosis not present

## 2016-01-19 DIAGNOSIS — Z8042 Family history of malignant neoplasm of prostate: Secondary | ICD-10-CM

## 2016-01-19 DIAGNOSIS — Z5112 Encounter for antineoplastic immunotherapy: Secondary | ICD-10-CM | POA: Diagnosis not present

## 2016-01-19 DIAGNOSIS — C772 Secondary and unspecified malignant neoplasm of intra-abdominal lymph nodes: Secondary | ICD-10-CM

## 2016-01-19 DIAGNOSIS — Z7689 Persons encountering health services in other specified circumstances: Secondary | ICD-10-CM | POA: Diagnosis not present

## 2016-01-19 DIAGNOSIS — E876 Hypokalemia: Secondary | ICD-10-CM | POA: Diagnosis not present

## 2016-01-19 DIAGNOSIS — Z87891 Personal history of nicotine dependence: Secondary | ICD-10-CM

## 2016-01-19 LAB — CBC WITH DIFFERENTIAL/PLATELET
BASOS ABS: 0.1 10*3/uL (ref 0–0.1)
Basophils Relative: 1 %
Eosinophils Absolute: 0.1 10*3/uL (ref 0–0.7)
Eosinophils Relative: 0 %
HEMATOCRIT: 38.5 % (ref 35.0–47.0)
HEMOGLOBIN: 12.7 g/dL (ref 12.0–16.0)
Lymphocytes Relative: 10 %
Lymphs Abs: 1.8 10*3/uL (ref 1.0–3.6)
MCH: 32.7 pg (ref 26.0–34.0)
MCHC: 33 g/dL (ref 32.0–36.0)
MCV: 99.1 fL (ref 80.0–100.0)
Monocytes Absolute: 1.3 10*3/uL — ABNORMAL HIGH (ref 0.2–0.9)
Monocytes Relative: 7 %
NEUTROS ABS: 14.5 10*3/uL — AB (ref 1.4–6.5)
Platelets: 54 10*3/uL — ABNORMAL LOW (ref 150–440)
RBC: 3.89 MIL/uL (ref 3.80–5.20)
RDW: 22.3 % — ABNORMAL HIGH (ref 11.5–14.5)
WBC: 17.7 10*3/uL — ABNORMAL HIGH (ref 3.6–11.0)

## 2016-01-19 LAB — COMPREHENSIVE METABOLIC PANEL
ALBUMIN: 3.9 g/dL (ref 3.5–5.0)
ALK PHOS: 124 U/L (ref 38–126)
ALT: 30 U/L (ref 14–54)
AST: 27 U/L (ref 15–41)
Anion gap: 5 (ref 5–15)
BILIRUBIN TOTAL: 0.3 mg/dL (ref 0.3–1.2)
BUN: 7 mg/dL (ref 6–20)
CALCIUM: 8.8 mg/dL — AB (ref 8.9–10.3)
CO2: 22 mmol/L (ref 22–32)
Chloride: 111 mmol/L (ref 101–111)
Creatinine, Ser: 0.67 mg/dL (ref 0.44–1.00)
GFR calc Af Amer: >60 mL/min (ref >60)
GFR calc non Af Amer: >60 mL/min (ref >60)
GLUCOSE: 91 mg/dL (ref 65–99)
Potassium: 3.6 mmol/L (ref 3.5–5.1)
Sodium: 138 mmol/L (ref 135–145)
TOTAL PROTEIN: 6.6 g/dL (ref 6.5–8.1)

## 2016-01-19 MED ORDER — SODIUM CHLORIDE 0.9% FLUSH
10.0000 mL | INTRAVENOUS | Status: DC | PRN
  Administered 2016-01-19: 10 mL via INTRAVENOUS
  Filled 2016-01-19: qty 10

## 2016-01-19 MED ORDER — HEPARIN SOD (PORK) LOCK FLUSH 100 UNIT/ML IV SOLN
500.0000 [IU] | Freq: Once | INTRAVENOUS | Status: AC
  Administered 2016-01-19: 500 [IU] via INTRAVENOUS
  Filled 2016-01-19: qty 5

## 2016-01-19 NOTE — Assessment & Plan Note (Addendum)
#  Colon cancer- cecal/ right-sided; STAGE IV [intraabdominal/retroperitonel LN/Supracla; K-ras mutated]. Currently on 5FU palliative chemotherapy+ avastin.   # Clinically no evidence of progression tolerating treatments fairly well except- low platelets  # HOLD Chemo today. Labs - platelets- 52; discontinue 5FU bolus.  Continue 5FU + avastin q 2 weeks.  Wbc- 17 sec to neulasta.   # # Hypokalemia- from chemotherapy/ mild diarrhea- improved; 3.6 continue- Potassium 20 mEq twice a day.   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 -improved.   # Diarrhea- grade 1 - improved on Imodium  # Follow-up with me in 2 weeks with chemotherapy Avastin.

## 2016-01-19 NOTE — Progress Notes (Signed)
[place] NOTE  Patient Care Team: Coral Spikes, DO as PCP - General (Family Medicine)  CHIEF COMPLAINTS/PURPOSE OF CONSULTATION:   Oncology History   # [facility] 2017- COLON CANCER- STAGE IV [s/p right hemi-colectomy; pT4apN2 (7/19LN)]; Pre-op CEA- [id number]  PET-  mesenteric adenopathy/mediastinal adenopathy/right-sided subclavicular lymph node.    April 17th -START FOLFOX; May 1st Add Avastin; 12/03/15-CT C/A/P-near CR  Aug 16th- Disc OX [sec PN]; con 5FU-Avastin   # MOLECULAR TESTING: K-ras-EXON 2- MUTATED; MSI- STABLE.      Cancer of ascending colon metastatic to intra-abdominal lymph node ([place])   09/06/2015 Initial Diagnosis    Cancer of ascending colon metastatic to intra-abdominal lymph node (HCC)        HISTORY OF PRESENTING ILLNESS:  Nancy Clark 55 y.o.  female  with a diagnosed Metastatic right-sided colon cancer status post 5-FU plus Avastin.  Patient is tingling and numbness has improved since stopping oxaliplatin.  Otherwise denies any nausea vomiting. Appetite is improving.  No fever no chills. Denies any significant diarrhea. Denies any headaches. Denies any vision changes. Denies any epistaxis. Denies any easy bruising or bleeding.  ROS: A complete 10 point review of system is done which is negative except mentioned above in history of present illness  MEDICAL HISTORY:  Past Medical History:  Diagnosis Date  . Chicken pox   . Colon cancer [facility])     SURGICAL HISTORY: Past Surgical History:  Procedure Laterality Date  . BREAST BIOPSY Right    2007? pt unsure done by [facility]  . COLONOSCOPY    . COLONOSCOPY WITH PROPOFOL N/A 10/14/2015   Procedure: COLONOSCOPY WITH PROPOFOL;  Surgeon: Hulen Luster, MD;  Location: [facility] ENDOSCOPY;  Service: Endoscopy;  Laterality: N/A;  . COLOSTOMY REVISION Right 08/09/2015   Procedure: COLON RESECTION RIGHT;  Surgeon: Florene Glen, MD;  Location: [facility] ORS;  Service: General;  Laterality: Right;  .  ECTOPIC PREGNANCY SURGERY    . PORTACATH PLACEMENT N/A 09/01/2015   Procedure: INSERTION PORT-A-CATH;  Surgeon: Florene Glen, MD;  Location: [facility] ORS;  Service: General;  Laterality: N/A;  . 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  1 pack in 3 days; no alcohol. 2 childern [25 & 17 years] Social History   Social History  . Marital status: Married    Spouse name: N/A  . Number of children: N/A  . Years of education: N/A   Occupational History  . Not on file.   Social History Main Topics  . Smoking status: Former Smoker    Packs/day: 0.25    Years: 2.00  . Smokeless tobacco: Never Used     Comment: recently quit  . Alcohol use No  . Drug use: No  . Sexual activity: Yes   Other Topics Concern  . Not on file   Social History Narrative  . No narrative on file    FAMILY HISTORY: 1 brother& 1 sister- no cancer. Mom-no cancer Family History  Problem Relation Age of Onset  . Hypertension Mother   . Arthritis Father   . Prostate cancer Father   . Diabetes Maternal Grandmother   . Colon cancer Maternal Grandfather     colon    ALLERGIES:  has No Known Allergies.  MEDICATIONS:  Current Outpatient Prescriptions  Medication Sig Dispense Refill  . acetaminophen (TYLENOL) 325 MG tablet Take 2 tablets (650 mg total) by mouth every 6 (six) hours as needed for mild pain (  or Fever >/= 101).    . feeding supplement, ENSURE ENLIVE, (ENSURE ENLIVE) LIQD Take 237 mLs by mouth 2 (two) times daily between meals. 60 Bottle 3  . ferrous sulfate (FERROUSUL) 325 (65 FE) MG tablet Take 1 tablet (325 mg total) by mouth daily with breakfast. 90 tablet 3  . HYDROcodone-acetaminophen (NORCO/VICODIN) 5-325 MG tablet Take 1 tablet by mouth every 6 (six) hours as needed for moderate pain or severe pain. 25 tablet 0  . lidocaine-prilocaine (EMLA) cream Apply 1 application topically as needed. Apply generously over the Mediport 45 minutes prior to  chemotherapy. 30 g 0  . Multiple Vitamins-Minerals (MULTIVITAMIN ADULT PO) Take 1 tablet by mouth daily.    . ondansetron (ZOFRAN) 8 MG tablet Take 1 tablet (8 mg total) by mouth every 8 (eight) hours as needed for nausea or vomiting (start 3 days; after chemo). 40 tablet 0  . potassium chloride SA (K-DUR,KLOR-CON) 20 MEQ tablet Take 2 tablets (40 mEq total) by mouth 2 (two) times daily. 60 tablet 3  . prochlorperazine (COMPAZINE) 10 MG tablet Take 1 tablet (10 mg total) by mouth every 6 (six) hours as needed for nausea or vomiting. 30 tablet 0   No current facility-administered medications for this visit.    Facility-Administered Medications Ordered in Other Visits  Medication Dose Route Frequency Provider Last Rate Last Dose  . sodium chloride flush (NS) 0.9 % injection 10 mL  10 mL Intravenous PRN Cammie Sickle, MD   10 mL at 10/04/15 0901  . sodium chloride flush (NS) 0.9 % injection 10 mL  10 mL Intravenous PRN Cammie Sickle, MD   10 mL at 01/19/16 0952      .  PHYSICAL EXAMINATION: ECOG PERFORMANCE STATUS: 0 - Asymptomatic  Vitals:   01/19/16 1012  BP: 135/84  Pulse: 72  Resp: 18  Temp: (!) 96.8 F (36 C)   Filed Weights   01/19/16 1012  Weight: 92 lb 7 oz (41.9 kg)    GENERAL: Well-nourished well-developed; thin built; Alert, no distress and comfortable.   Accompanied by her mother.  EYES: no pallor or icterus OROPHARYNX: no thrush or ulceration; good dentition  NECK: supple, no masses felt LYMPH:  no palpable lymphadenopathy in the cervical, axillary or inguinal regions LUNGS: clear to auscultation and  No wheeze or crackles HEART/CVS: regular rate & rhythm and no murmurs; No lower extremity edema ABDOMEN: abdomen soft, non-tender and normal bowel sounds; surgical incisions noted. No infections. Musculoskeletal:no cyanosis of digits and no clubbing  PSYCH: alert & oriented x 3 with fluent speech NEURO: no focal motor/sensory deficits SKIN:  no rashes  or significant lesions  LABORATORY DATA:  I have reviewed the data as listed Lab Results  Component Value Date   WBC 17.7 (H) 01/19/2016   HGB 12.7 01/19/2016   HCT 38.5 01/19/2016   MCV 99.1 01/19/2016   PLT 54 (L) 01/19/2016    Recent Labs  12/20/15 0900 01/05/16 0853 01/19/16 0953  NA 137 138 138  K 3.4* 3.8 3.6  CL 109 112* 111  CO2 21* 20* 22  GLUCOSE 115* 115* 91  BUN _0 CREATININE 0.69 0.67 0.67  CALCIUM 8.8* 8.6* 8.8*  GFRNONAA >60 >60 >60  GFRAA >60 >60 >60  PROT 6.9 6.5 6.6  ALBUMIN 4.0 3.6 3.9  AST 26 40 27  ALT 27 42 30  ALKPHOS 126 100 124  BILITOT 0.4 0.5 0.3     ASSESSMENT & PLAN:   .  Cancer of ascending colon metastatic to intra-abdominal lymph node ([place]) # Colon cancer- cecal/ right-sided; STAGE IV [intraabdominal/retroperitonel LN/Supracla; K-ras mutated]. Currently on 5FU palliative chemotherapy+ avastin.   # Clinically no evidence of progression tolerating treatments fairly well except- low platelets  # HOLD Chemo today. Labs - platelets- 52; discontinue 5FU bolus.  Continue 5FU + avastin q 2 weeks.  Wbc- 17 sec to neulasta.   # # Hypokalemia- from chemotherapy/ mild diarrhea- improved; 3.6 continue- Potassium 20 mEq twice a day.   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 -improved.   # Diarrhea- grade 1 - improved on Imodium  # Follow-up with me in 2 weeks with chemotherapy Avastin.     Cammie Sickle, MD 01/19/2016 1:37 PM

## 2016-01-21 ENCOUNTER — Inpatient Hospital Stay: Payer: 59

## 2016-01-31 ENCOUNTER — Inpatient Hospital Stay: Payer: BLUE CROSS/BLUE SHIELD | Attending: Internal Medicine | Admitting: Internal Medicine

## 2016-01-31 ENCOUNTER — Inpatient Hospital Stay: Payer: BLUE CROSS/BLUE SHIELD

## 2016-01-31 VITALS — BP 130/75 | HR 74 | Temp 97.2°F | Resp 18

## 2016-01-31 VITALS — BP 147/80 | HR 73 | Temp 96.3°F | Resp 18 | Ht 60.0 in | Wt 101.0 lb

## 2016-01-31 DIAGNOSIS — I10 Essential (primary) hypertension: Secondary | ICD-10-CM | POA: Insufficient documentation

## 2016-01-31 DIAGNOSIS — Z79899 Other long term (current) drug therapy: Secondary | ICD-10-CM

## 2016-01-31 DIAGNOSIS — C182 Malignant neoplasm of ascending colon: Secondary | ICD-10-CM | POA: Insufficient documentation

## 2016-01-31 DIAGNOSIS — E876 Hypokalemia: Secondary | ICD-10-CM | POA: Diagnosis not present

## 2016-01-31 DIAGNOSIS — R197 Diarrhea, unspecified: Secondary | ICD-10-CM | POA: Diagnosis not present

## 2016-01-31 DIAGNOSIS — C772 Secondary and unspecified malignant neoplasm of intra-abdominal lymph nodes: Secondary | ICD-10-CM

## 2016-01-31 DIAGNOSIS — D6959 Other secondary thrombocytopenia: Secondary | ICD-10-CM | POA: Diagnosis not present

## 2016-01-31 DIAGNOSIS — Z5112 Encounter for antineoplastic immunotherapy: Secondary | ICD-10-CM | POA: Diagnosis not present

## 2016-01-31 DIAGNOSIS — Z5111 Encounter for antineoplastic chemotherapy: Secondary | ICD-10-CM | POA: Diagnosis not present

## 2016-01-31 DIAGNOSIS — Z87891 Personal history of nicotine dependence: Secondary | ICD-10-CM | POA: Diagnosis not present

## 2016-01-31 DIAGNOSIS — D696 Thrombocytopenia, unspecified: Secondary | ICD-10-CM

## 2016-01-31 DIAGNOSIS — Z800 Family history of malignant neoplasm of digestive organs: Secondary | ICD-10-CM | POA: Diagnosis not present

## 2016-01-31 DIAGNOSIS — Z9049 Acquired absence of other specified parts of digestive tract: Secondary | ICD-10-CM | POA: Insufficient documentation

## 2016-01-31 DIAGNOSIS — Z8042 Family history of malignant neoplasm of prostate: Secondary | ICD-10-CM | POA: Diagnosis not present

## 2016-01-31 DIAGNOSIS — T451X5S Adverse effect of antineoplastic and immunosuppressive drugs, sequela: Secondary | ICD-10-CM | POA: Diagnosis not present

## 2016-01-31 LAB — URINALYSIS COMPLETE WITH MICROSCOPIC (ARMC ONLY)
BACTERIA UA: NONE SEEN
BILIRUBIN URINE: NEGATIVE
GLUCOSE, UA: NEGATIVE mg/dL
Hgb urine dipstick: NEGATIVE
Ketones, ur: NEGATIVE mg/dL
LEUKOCYTES UA: NEGATIVE
NITRITE: NEGATIVE
Protein, ur: NEGATIVE mg/dL
SPECIFIC GRAVITY, URINE: 1.017 (ref 1.005–1.030)
pH: 5 (ref 5.0–8.0)

## 2016-01-31 LAB — COMPREHENSIVE METABOLIC PANEL
ALBUMIN: 3.2 g/dL — AB (ref 3.5–5.0)
ALK PHOS: 83 U/L (ref 38–126)
ALT: 75 U/L — ABNORMAL HIGH (ref 14–54)
ANION GAP: 5 (ref 5–15)
AST: 57 U/L — ABNORMAL HIGH (ref 15–41)
BILIRUBIN TOTAL: 0.4 mg/dL (ref 0.3–1.2)
BUN: 10 mg/dL (ref 6–20)
CALCIUM: 8.6 mg/dL — AB (ref 8.9–10.3)
CO2: 22 mmol/L (ref 22–32)
Chloride: 114 mmol/L — ABNORMAL HIGH (ref 101–111)
Creatinine, Ser: 0.61 mg/dL (ref 0.44–1.00)
GFR calc Af Amer: >60 mL/min (ref >60)
GLUCOSE: 97 mg/dL (ref 65–99)
POTASSIUM: 3.7 mmol/L (ref 3.5–5.1)
Sodium: 141 mmol/L (ref 135–145)
TOTAL PROTEIN: 5.9 g/dL — AB (ref 6.5–8.1)

## 2016-01-31 LAB — CBC WITH DIFFERENTIAL/PLATELET
BASOS ABS: 0.1 10*3/uL (ref 0–0.1)
Basophils Relative: 1 %
Eosinophils Absolute: 0.1 10*3/uL (ref 0–0.7)
Eosinophils Relative: 1 %
HEMATOCRIT: 36.5 % (ref 35.0–47.0)
Hemoglobin: 12.1 g/dL (ref 12.0–16.0)
LYMPHS ABS: 1.3 10*3/uL (ref 1.0–3.6)
MCH: 33.8 pg (ref 26.0–34.0)
MCHC: 33.3 g/dL (ref 32.0–36.0)
MCV: 101.6 fL — AB (ref 80.0–100.0)
Monocytes Absolute: 0.7 10*3/uL (ref 0.2–0.9)
Monocytes Relative: 10 %
NEUTROS ABS: 5.2 10*3/uL (ref 1.4–6.5)
Neutrophils Relative %: 70 %
Platelets: 72 10*3/uL — ABNORMAL LOW (ref 150–440)
RBC: 3.59 MIL/uL — AB (ref 3.80–5.20)
RDW: 19.4 % — AB (ref 11.5–14.5)
WBC: 7.4 10*3/uL (ref 3.6–11.0)

## 2016-01-31 MED ORDER — SODIUM CHLORIDE 0.9 % IV SOLN
2400.0000 mg/m2 | INTRAVENOUS | Status: DC
  Administered 2016-01-31: 3350 mg via INTRAVENOUS
  Filled 2016-01-31: qty 67

## 2016-01-31 MED ORDER — LEUCOVORIN CALCIUM INJECTION 350 MG: 400.0000 mg/m2 | Freq: Once | INTRAVENOUS | Status: DC

## 2016-01-31 MED ORDER — HEPARIN SOD (PORK) LOCK FLUSH 100 UNIT/ML IV SOLN: 500.0000 [IU] | Freq: Once | INTRAVENOUS | Status: DC | PRN

## 2016-01-31 MED ORDER — DEXTROSE 5 % IV SOLN
Freq: Once | INTRAVENOUS | Status: DC
  Filled 2016-01-31: qty 1000

## 2016-01-31 MED ORDER — SODIUM CHLORIDE 0.9 % IV SOLN
Freq: Once | INTRAVENOUS | Status: AC
  Administered 2016-01-31: 10 mg via INTRAVENOUS
  Filled 2016-01-31: qty 4

## 2016-01-31 MED ORDER — SODIUM CHLORIDE 0.9% FLUSH
10.0000 mL | INTRAVENOUS | Status: DC | PRN
  Administered 2016-01-31: 10 mL
  Filled 2016-01-31: qty 10

## 2016-01-31 MED ORDER — SODIUM CHLORIDE 0.9 % IV SOLN
Freq: Once | INTRAVENOUS | Status: AC
  Administered 2016-01-31: 10:00:00 via INTRAVENOUS
  Filled 2016-01-31: qty 1000

## 2016-01-31 MED ORDER — BEVACIZUMAB CHEMO INJECTION 400 MG/16ML
5.0000 mg/kg | Freq: Once | INTRAVENOUS | Status: AC
  Administered 2016-01-31: 225 mg via INTRAVENOUS
  Filled 2016-01-31: qty 9

## 2016-01-31 MED ORDER — LEUCOVORIN CALCIUM INJECTION 100 MG
20.0000 mg/m2 | Freq: Once | INTRAMUSCULAR | Status: AC
  Administered 2016-01-31: 28 mg via INTRAVENOUS
  Filled 2016-01-31: qty 1.4

## 2016-01-31 NOTE — Progress Notes (Signed)
Platelets 72 today. MD aware wants to proceed with Avastin and 5 FU pump today.

## 2016-01-31 NOTE — Progress Notes (Signed)
Patient has no medical complaints today. She has had a 9 lb wt gain. Boost/Ensure 3 per day at each meal time.

## 2016-01-31 NOTE — Assessment & Plan Note (Addendum)
#  Colon cancer- cecal/ right-sided; STAGE IV [intraabdominal/retroperitonel LN/Supracla; K-ras mutated]. Currently on 5FU palliative chemotherapy+ avastin.   # Clinically no evidence of progression tolerating treatments fairly well except- low platelets  # proceed Chemo today. Labs - platelets- 72 discontinue 5FU bolus.  Continue 5FU + avastin q 2 weeks.  I will take the patient off ONPRO- as she is been off oxaliplatin.  # # Hypokalemia- from chemotherapy/ mild diarrhea- improved; 3.7- recommend K dur 20 once a day.   # HTN- systolics 761Y; recommend BP checks at home; and bring alog at next visit/call.   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    # Follow-up with me in 2 weeks with chemotherapy Avastin. Will take off neulasta.

## 2016-01-31 NOTE — Progress Notes (Signed)
[place] NOTE  Patient Care Team: Coral Spikes, DO as PCP - General (Family Medicine)  CHIEF COMPLAINTS/PURPOSE OF CONSULTATION:   Oncology History   # [facility] 2017- COLON CANCER- STAGE IV [s/p right hemi-colectomy; pT4apN2 (7/19LN)]; Pre-op CEA- [id number]-  PET-  mesenteric adenopathy/mediastinal adenopathy/right-sided subclavicular lymph node.    April 17th -START FOLFOX; May 1st Add Avastin; 12/03/15-CT C/A/P-near CR  Aug 16th- Disc OX [sec PN]; con 5FU-Avastin   # MOLECULAR TESTING: K-ras-EXON 2- MUTATED; MSI- STABLE.      Cancer of ascending colon metastatic to intra-abdominal lymph node ([place])   09/06/2015 Initial Diagnosis    Cancer of ascending colon metastatic to intra-abdominal lymph node (HCC)        HISTORY OF PRESENTING ILLNESS:  Nancy Clark 55 y.o.  female  with a diagnosed Metastatic right-sided colon cancer status post 5-FU plus Avastin. Chemotherapy was held 2 weeks ago because of low platelets.  Patient's appetite is good. She is gained some weight. No nausea no vomiting. No bleeding. No chest pain or shortness of breath or cough. Denies any tingling and numbness.  ROS: A complete 10 point review of system is done which is negative except mentioned above in history of present illness  MEDICAL HISTORY:  Past Medical History:  Diagnosis Date  . Chicken pox   . Colon cancer [facility])     SURGICAL HISTORY: Past Surgical History:  Procedure Laterality Date  . BREAST BIOPSY Right    2007? pt unsure done by [facility]  . COLONOSCOPY    . COLONOSCOPY WITH PROPOFOL N/A 10/14/2015   Procedure: COLONOSCOPY WITH PROPOFOL;  Surgeon: Hulen Luster, MD;  Location: [facility] ENDOSCOPY;  Service: Endoscopy;  Laterality: N/A;  . COLOSTOMY REVISION Right 08/09/2015   Procedure: COLON RESECTION RIGHT;  Surgeon: Florene Glen, MD;  Location: [facility] ORS;  Service: General;  Laterality: Right;  . ECTOPIC PREGNANCY SURGERY    . PORTACATH PLACEMENT N/A 09/01/2015    Procedure: INSERTION PORT-A-CATH;  Surgeon: Florene Glen, MD;  Location: [facility] ORS;  Service: General;  Laterality: N/A;  . 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  1 pack in 3 days; no alcohol. 2 childern [25 & 17 years] Social History   Social History  . Marital status: Married    Spouse name: N/A  . Number of children: N/A  . Years of education: N/A   Occupational History  . Not on file.   Social History Main Topics  . Smoking status: Former Smoker    Packs/day: 0.25    Years: 2.00  . Smokeless tobacco: Never Used     Comment: recently quit  . Alcohol use No  . Drug use: No  . Sexual activity: Yes   Other Topics Concern  . Not on file   Social History Narrative  . No narrative on file    FAMILY HISTORY: 1 brother& 1 sister- no cancer. Mom-no cancer Family History  Problem Relation Age of Onset  . Hypertension Mother   . Arthritis Father   . Prostate cancer Father   . Diabetes Maternal Grandmother   . Colon cancer Maternal Grandfather     colon    ALLERGIES:  has No Known Allergies.  MEDICATIONS:  Current Outpatient Prescriptions  Medication Sig Dispense Refill  . feeding supplement, ENSURE ENLIVE, (ENSURE ENLIVE) LIQD Take 237 mLs by mouth 2 (two) times daily between meals. (Patient taking differently: Take 237 mLs by mouth 3 (  three) times daily between meals. ) 60 Bottle 3  . ferrous sulfate (FERROUSUL) 325 (65 FE) MG tablet Take 1 tablet (325 mg total) by mouth daily with breakfast. 90 tablet 3  . lidocaine-prilocaine (EMLA) cream Apply 1 application topically as needed. Apply generously over the Mediport 45 minutes prior to chemotherapy. 30 g 0  . Multiple Vitamins-Minerals (MULTIVITAMIN ADULT PO) Take 1 tablet by mouth daily.    . potassium chloride SA (K-DUR,KLOR-CON) 20 MEQ tablet Take 2 tablets (40 mEq total) by mouth 2 (two) times daily. (Patient taking differently: Take 40 mEq by mouth once. ) 60 tablet 3   . acetaminophen (TYLENOL) 325 MG tablet Take 2 tablets (650 mg total) by mouth every 6 (six) hours as needed for mild pain (or Fever >/= 101). (Patient not taking: Reported on 01/31/2016)    . HYDROcodone-acetaminophen (NORCO/VICODIN) 5-325 MG tablet Take 1 tablet by mouth every 6 (six) hours as needed for moderate pain or severe pain. (Patient not taking: Reported on 01/31/2016) 25 tablet 0  . ondansetron (ZOFRAN) 8 MG tablet Take 1 tablet (8 mg total) by mouth every 8 (eight) hours as needed for nausea or vomiting (start 3 days; after chemo). (Patient not taking: Reported on 01/31/2016) 40 tablet 0  . prochlorperazine (COMPAZINE) 10 MG tablet Take 1 tablet (10 mg total) by mouth every 6 (six) hours as needed for nausea or vomiting. (Patient not taking: Reported on 01/31/2016) 30 tablet 0   No current facility-administered medications for this visit.    Facility-Administered Medications Ordered in Other Visits  Medication Dose Route Frequency Provider Last Rate Last Dose  . dextrose 5 % solution   Intravenous Once Cammie Sickle, MD      . fluorouracil (ADRUCIL) 3,350 mg in sodium chloride 0.9 % 83 mL chemo infusion  2,400 mg/m2 (Treatment Plan Recorded) Intravenous 1 day or 1 dose Cammie Sickle, MD   3,350 mg at 01/31/16 1149  . heparin lock flush 100 unit/mL  500 Units Intracatheter Once PRN Cammie Sickle, MD      . sodium chloride flush (NS) 0.9 % injection 10 mL  10 mL Intravenous PRN Cammie Sickle, MD   10 mL at 10/04/15 0901  . sodium chloride flush (NS) 0.9 % injection 10 mL  10 mL Intracatheter PRN Cammie Sickle, MD   10 mL at 01/31/16 0845      .  PHYSICAL EXAMINATION: ECOG PERFORMANCE STATUS: 0 - Asymptomatic  Vitals:   01/31/16 0900 01/31/16 0901  BP: (!) 161/79 (!) 147/80  Pulse: 65 73  Resp: 18   Temp: (!) 96.3 F (35.7 C)    Filed Weights   01/31/16 0900  Weight: 101 lb (45.8 kg)    GENERAL: Well-nourished well-developed; thin built;  Alert, no distress and comfortable.   Accompanied by her mother.  EYES: no pallor or icterus OROPHARYNX: no thrush or ulceration; good dentition  NECK: supple, no masses felt LYMPH:  no palpable lymphadenopathy in the cervical, axillary or inguinal regions LUNGS: clear to auscultation and  No wheeze or crackles HEART/CVS: regular rate & rhythm and no murmurs; No lower extremity edema ABDOMEN: abdomen soft, non-tender and normal bowel sounds; surgical incisions noted. No infections. Musculoskeletal:no cyanosis of digits and no clubbing  PSYCH: alert & oriented x 3 with fluent speech NEURO: no focal motor/sensory deficits SKIN:  no rashes or significant lesions  LABORATORY DATA:  I have reviewed the data as listed Lab Results  Component Value Date  WBC 7.4 01/31/2016   HGB 12.1 01/31/2016   HCT 36.5 01/31/2016   MCV 101.6 (H) 01/31/2016   PLT 72 (L) 01/31/2016    Recent Labs  01/05/16 0853 01/19/16 0953 01/31/16 0846  NA 138 138 141  K 3.8 3.6 3.7  CL 112* 111 114*  CO2 20* 22 22  GLUCOSE 115* 91 97  BUN 11 7 10   CREATININE 0.67 0.67 0.61  CALCIUM 8.6* 8.8* 8.6*  GFRNONAA >60 >60 >60  GFRAA >60 >60 >60  PROT 6.5 6.6 5.9*  ALBUMIN 3.6 3.9 3.2*  AST 40 27 57*  ALT 42 30 75*  ALKPHOS 100 124 83  BILITOT 0.5 0.3 0.4     ASSESSMENT & PLAN:   .Cancer of ascending colon metastatic to intra-abdominal lymph node (HCC) # Colon cancer- cecal/ right-sided; STAGE IV [intraabdominal/retroperitonel LN/Supracla; K-ras mutated]. Currently on 5FU palliative chemotherapy+ avastin.   # Clinically no evidence of progression tolerating treatments fairly well except- low platelets  # proceed Chemo today. Labs - platelets- 72 discontinue 5FU bolus.  Continue 5FU + avastin q 2 weeks.  I will take the patient off ONPRO- as she is been off oxaliplatin.  # # Hypokalemia- from chemotherapy/ mild diarrhea- improved; 3.7- recommend K dur 20 once a day.   # HTN- systolics 116I; recommend BP  checks at home; and bring alog at next visit/call.   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    # Follow-up with me in 2 weeks with chemotherapy Avastin. Will take off neulasta.     Cammie Sickle, MD 01/31/2016 1:12 PM

## 2016-02-01 LAB — CEA: CEA: 4.4 ng/mL (ref 0.0–4.7)

## 2016-02-02 ENCOUNTER — Inpatient Hospital Stay: Payer: BLUE CROSS/BLUE SHIELD

## 2016-02-02 ENCOUNTER — Ambulatory Visit: Payer: 59 | Admitting: Internal Medicine

## 2016-02-02 ENCOUNTER — Ambulatory Visit: Payer: 59

## 2016-02-02 ENCOUNTER — Other Ambulatory Visit: Payer: 59

## 2016-02-02 DIAGNOSIS — C772 Secondary and unspecified malignant neoplasm of intra-abdominal lymph nodes: Principal | ICD-10-CM

## 2016-02-02 DIAGNOSIS — C182 Malignant neoplasm of ascending colon: Secondary | ICD-10-CM

## 2016-02-02 MED ORDER — HEPARIN SOD (PORK) LOCK FLUSH 100 UNIT/ML IV SOLN
500.0000 [IU] | Freq: Once | INTRAVENOUS | Status: AC
  Administered 2016-02-02: 500 [IU] via INTRAVENOUS

## 2016-02-02 MED ORDER — SODIUM CHLORIDE 0.9% FLUSH
10.0000 mL | INTRAVENOUS | Status: DC | PRN
  Administered 2016-02-02: 10 mL via INTRAVENOUS
  Filled 2016-02-02: qty 10

## 2016-02-08 ENCOUNTER — Encounter: Payer: Self-pay | Admitting: Dietician

## 2016-02-08 ENCOUNTER — Encounter: Payer: Self-pay | Attending: Internal Medicine | Admitting: Dietician

## 2016-02-08 VITALS — Ht 60.0 in | Wt 98.0 lb

## 2016-02-08 DIAGNOSIS — R634 Abnormal weight loss: Secondary | ICD-10-CM

## 2016-02-08 DIAGNOSIS — C189 Malignant neoplasm of colon, unspecified: Secondary | ICD-10-CM

## 2016-02-08 NOTE — Progress Notes (Signed)
Medical Nutrition Therapy Follow-up visit:  Time with patient: 10:25- 11:15 Visit #: 3 ASSESSMENT:  Diagnosis:weight loss, colon cancer  Current weight: 98 lbs    Height: 60 in Medications: See list Progress and evaluation: Patient in for medical nutrition therapy follow-up. She reports her appetite has improved. She is eating 3 meals per day which usually include 1-3 oz of protein. She reports she continues to drink Gaterade protein shake 3 x/day and is drinking Boost high calorie once daily. She has experienced a weight gain of 7 lbs in the past month.  Her protein intake is between 70-80 gms per day. She reports she feels "so much better and stronger". She drinks adequate fluid with 48 to 64 oz of water daily.  She eats 1-2 servings of vegetables daily and eats fruit 2-3 times per week. She continues to take a multi-vitamin. She has followed through with previous goals set to increase calories and protein.   NUTRITION CARE EDUCATION: Basic nutrition:  Reviewed progress with previous goals set. Commended on her efforts to increase calories and drink supplements to boost calories and nutrition. Discussed how as appetite improves, she may be able to eliminate one of the protein supplements.  Discussed soft fruits and vegetables she could add to her diet to further increase nutrients.  INTERVENTION: Continue with previous goals set. Include soft cooked fruits or canned fruits such as peaches and  Applesauce. Also, include soft cooked vegetables. Continue to drink 8 oz of Boost high calorie daily.  EDUCATION MATERIALS GIVEN:  . Samples of Boost High calorie supplement . Goals/ instructions LEARNER/ who was taught:  . Patient   LEVEL OF UNDERSTANDING: . Verbalizes/ demonstrates competency LEARNING BARRIERS: . None  WILLINGNESS TO LEARN/READINESS FOR CHANGE: . Eager, change in progress MONITORING AND EVALUATION:  Weight, nutrition, Follow-up: 03/14/16 at 10:30am

## 2016-02-14 ENCOUNTER — Inpatient Hospital Stay: Payer: BLUE CROSS/BLUE SHIELD

## 2016-02-14 ENCOUNTER — Inpatient Hospital Stay (HOSPITAL_BASED_OUTPATIENT_CLINIC_OR_DEPARTMENT_OTHER): Payer: BLUE CROSS/BLUE SHIELD | Admitting: Internal Medicine

## 2016-02-14 ENCOUNTER — Encounter (INDEPENDENT_AMBULATORY_CARE_PROVIDER_SITE_OTHER): Payer: Self-pay

## 2016-02-14 VITALS — BP 144/89 | HR 73 | Temp 96.6°F | Resp 20 | Ht 60.0 in | Wt 99.5 lb

## 2016-02-14 DIAGNOSIS — T451X5S Adverse effect of antineoplastic and immunosuppressive drugs, sequela: Secondary | ICD-10-CM

## 2016-02-14 DIAGNOSIS — C182 Malignant neoplasm of ascending colon: Secondary | ICD-10-CM

## 2016-02-14 DIAGNOSIS — D6959 Other secondary thrombocytopenia: Secondary | ICD-10-CM | POA: Diagnosis not present

## 2016-02-14 DIAGNOSIS — Z9049 Acquired absence of other specified parts of digestive tract: Secondary | ICD-10-CM

## 2016-02-14 DIAGNOSIS — C772 Secondary and unspecified malignant neoplasm of intra-abdominal lymph nodes: Secondary | ICD-10-CM | POA: Diagnosis not present

## 2016-02-14 DIAGNOSIS — Z800 Family history of malignant neoplasm of digestive organs: Secondary | ICD-10-CM

## 2016-02-14 DIAGNOSIS — I10 Essential (primary) hypertension: Secondary | ICD-10-CM

## 2016-02-14 DIAGNOSIS — R197 Diarrhea, unspecified: Secondary | ICD-10-CM

## 2016-02-14 DIAGNOSIS — Z79899 Other long term (current) drug therapy: Secondary | ICD-10-CM

## 2016-02-14 DIAGNOSIS — Z87891 Personal history of nicotine dependence: Secondary | ICD-10-CM

## 2016-02-14 DIAGNOSIS — E876 Hypokalemia: Secondary | ICD-10-CM

## 2016-02-14 LAB — COMPREHENSIVE METABOLIC PANEL
ALK PHOS: 124 U/L (ref 38–126)
ALT: 41 U/L (ref 14–54)
ANION GAP: 5 (ref 5–15)
AST: 29 U/L (ref 15–41)
Albumin: 3.8 g/dL (ref 3.5–5.0)
BUN: 15 mg/dL (ref 6–20)
CALCIUM: 9 mg/dL (ref 8.9–10.3)
CO2: 22 mmol/L (ref 22–32)
CREATININE: 0.66 mg/dL (ref 0.44–1.00)
Chloride: 113 mmol/L — ABNORMAL HIGH (ref 101–111)
Glucose, Bld: 102 mg/dL — ABNORMAL HIGH (ref 65–99)
Potassium: 3.6 mmol/L (ref 3.5–5.1)
SODIUM: 140 mmol/L (ref 135–145)
Total Bilirubin: 0.4 mg/dL (ref 0.3–1.2)
Total Protein: 6.8 g/dL (ref 6.5–8.1)

## 2016-02-14 LAB — CBC WITH DIFFERENTIAL/PLATELET
Basophils Absolute: 0.1 10*3/uL (ref 0–0.1)
Basophils Relative: 1 %
EOS ABS: 0.3 10*3/uL (ref 0–0.7)
EOS PCT: 5 %
HCT: 38.3 % (ref 35.0–47.0)
HEMOGLOBIN: 12.8 g/dL (ref 12.0–16.0)
LYMPHS ABS: 1.2 10*3/uL (ref 1.0–3.6)
LYMPHS PCT: 18 %
MCH: 33.3 pg (ref 26.0–34.0)
MCHC: 33.5 g/dL (ref 32.0–36.0)
MCV: 99.4 fL (ref 80.0–100.0)
MONOS PCT: 8 %
Monocytes Absolute: 0.5 10*3/uL (ref 0.2–0.9)
Neutro Abs: 4.4 10*3/uL (ref 1.4–6.5)
Neutrophils Relative %: 68 %
PLATELETS: 39 10*3/uL — AB (ref 150–440)
RBC: 3.85 MIL/uL (ref 3.80–5.20)
RDW: 18.2 % — ABNORMAL HIGH (ref 11.5–14.5)
WBC: 6.5 10*3/uL (ref 3.6–11.0)

## 2016-02-14 MED ORDER — HEPARIN SOD (PORK) LOCK FLUSH 100 UNIT/ML IV SOLN
INTRAVENOUS | Status: AC
  Filled 2016-02-14: qty 5

## 2016-02-14 MED ORDER — HEPARIN SOD (PORK) LOCK FLUSH 100 UNIT/ML IV SOLN
500.0000 [IU] | Freq: Once | INTRAVENOUS | Status: AC
  Administered 2016-02-14: 500 [IU] via INTRAVENOUS

## 2016-02-14 MED ORDER — SODIUM CHLORIDE 0.9% FLUSH
10.0000 mL | Freq: Once | INTRAVENOUS | Status: AC
  Administered 2016-02-14: 10 mL via INTRAVENOUS
  Filled 2016-02-14: qty 10

## 2016-02-14 NOTE — Assessment & Plan Note (Signed)
#  Colon cancer- cecal/ right-sided; STAGE IV [intraabdominal/retroperitonel LN/Supracla; K-ras mutated]. Currently on 5FU palliative chemotherapy+ avastin.   # Clinically no evidence of progression tolerating treatments fairly well except- low platelets 39; HOLD CHEMO today.   # recheck blood counts in 1 week; and platelets above 70-  Continue 5FU + avastin q 3 weeks.    # # Hypokalemia- from chemotherapy/ mild diarrhea- improved; 3.7-    # HTN- systolics 027X; better at home as per pt.   # labs- 1 week; 5FU-Avastin'\; and then Follow-up with me in 4 weeks with chemotherapy Avastin

## 2016-02-14 NOTE — Progress Notes (Signed)
[place] NOTE  Patient Care Team: Coral Spikes, DO as PCP - General (Family Medicine)  CHIEF COMPLAINTS/PURPOSE OF CONSULTATION:   Oncology History   # [facility] 2017- COLON CANCER- STAGE IV [s/p right hemi-colectomy; pT4apN2 (7/19LN)]; Pre-op CEA- [id number]  PET-  mesenteric adenopathy/mediastinal adenopathy/right-sided subclavicular lymph node.    April 17th -START FOLFOX; May 1st Add Avastin; 12/03/15-CT C/A/P-near CR  Aug 16th- Disc OX [sec PN]; con 5FU-Avastin   # MOLECULAR TESTING: K-ras-EXON 2- MUTATED; MSI- STABLE.      Cancer of ascending colon metastatic to intra-abdominal lymph node ([place])   09/06/2015 Initial Diagnosis    Cancer of ascending colon metastatic to intra-abdominal lymph node (HCC)        HISTORY OF PRESENTING ILLNESS:  Nancy Clark 55 y.o.  female  with a diagnosed Metastatic right-sided colon cancer status post 5-FU plus Avastin.   Denies any abdominal pain. Denies epistaxis. Denies any headaches.  Patient's appetite is good. She is gained some weight. No nausea no vomiting. No bleeding. No chest pain or shortness of breath or cough. Denies any tingling and numbness.  ROS: A complete 10 point review of system is done which is negative except mentioned above in history of present illness  MEDICAL HISTORY:  Past Medical History:  Diagnosis Date  . Chicken pox   . Colon cancer [facility])     SURGICAL HISTORY: Past Surgical History:  Procedure Laterality Date  . BREAST BIOPSY Right    2007? pt unsure done by [facility]  . COLONOSCOPY    . COLONOSCOPY WITH PROPOFOL N/A 10/14/2015   Procedure: COLONOSCOPY WITH PROPOFOL;  Surgeon: Hulen Luster, MD;  Location: [facility] ENDOSCOPY;  Service: Endoscopy;  Laterality: N/A;  . COLOSTOMY REVISION Right 08/09/2015   Procedure: COLON RESECTION RIGHT;  Surgeon: Florene Glen, MD;  Location: [facility] ORS;  Service: General;  Laterality: Right;  . ECTOPIC PREGNANCY SURGERY    . PORTACATH PLACEMENT N/A  09/01/2015   Procedure: INSERTION PORT-A-CATH;  Surgeon: Florene Glen, MD;  Location: [facility] ORS;  Service: General;  Laterality: N/A;  . 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  1 pack in 3 days; no alcohol. 2 childern [25 & 17 years] Social History   Social History  . Marital status: Married    Spouse name: N/A  . Number of children: N/A  . Years of education: N/A   Occupational History  . Not on file.   Social History Main Topics  . Smoking status: Former Smoker    Packs/day: 0.25    Years: 2.00  . Smokeless tobacco: Never Used     Comment: recently quit  . Alcohol use No  . Drug use: No  . Sexual activity: Yes   Other Topics Concern  . Not on file   Social History Narrative  . No narrative on file    FAMILY HISTORY: 1 brother& 1 sister- no cancer. Mom-no cancer Family History  Problem Relation Age of Onset  . Hypertension Mother   . Arthritis Father   . Prostate cancer Father   . Diabetes Maternal Grandmother   . Colon cancer Maternal Grandfather     colon    ALLERGIES:  has No Known Allergies.  MEDICATIONS:  Current Outpatient Prescriptions  Medication Sig Dispense Refill  . feeding supplement (BOOST HIGH PROTEIN) LIQD Take 1 Container by mouth daily.    . feeding supplement, ENSURE ENLIVE, (ENSURE ENLIVE) LIQD Take 237 mLs  by mouth 2 (two) times daily between meals. 60 Bottle 3  . ferrous sulfate (FERROUSUL) 325 (65 FE) MG tablet Take 1 tablet (325 mg total) by mouth daily with breakfast. 90 tablet 3  . lidocaine-prilocaine (EMLA) cream Apply 1 application topically as needed. Apply generously over the Mediport 45 minutes prior to chemotherapy. 30 g 0  . Multiple Vitamins-Minerals (MULTIVITAMIN ADULT PO) Take 1 tablet by mouth daily.    . ondansetron (ZOFRAN) 8 MG tablet Take 1 tablet (8 mg total) by mouth every 8 (eight) hours as needed for nausea or vomiting (start 3 days; after chemo). 40 tablet 0  . potassium  chloride SA (K-DUR,KLOR-CON) 20 MEQ tablet Take 2 tablets (40 mEq total) by mouth 2 (two) times daily. (Patient taking differently: Take 20 mEq by mouth daily. ) 60 tablet 3  . prochlorperazine (COMPAZINE) 10 MG tablet Take 1 tablet (10 mg total) by mouth every 6 (six) hours as needed for nausea or vomiting. 30 tablet 0  . acetaminophen (TYLENOL) 325 MG tablet Take 2 tablets (650 mg total) by mouth every 6 (six) hours as needed for mild pain (or Fever >/= 101). (Patient not taking: Reported on 02/14/2016)    . HYDROcodone-acetaminophen (NORCO/VICODIN) 5-325 MG tablet Take 1 tablet by mouth every 6 (six) hours as needed for moderate pain or severe pain. (Patient not taking: Reported on 02/14/2016) 25 tablet 0   No current facility-administered medications for this visit.    Facility-Administered Medications Ordered in Other Visits  Medication Dose Route Frequency Provider Last Rate Last Dose  . sodium chloride flush (NS) 0.9 % injection 10 mL  10 mL Intravenous PRN Cammie Sickle, MD   10 mL at 10/04/15 0901      .  PHYSICAL EXAMINATION: ECOG PERFORMANCE STATUS: 0 - Asymptomatic  Vitals:   02/14/16 0858  BP: (!) 144/89  Pulse: 73  Resp: 20  Temp: (!) 96.6 F (35.9 C)   Filed Weights   02/14/16 0858  Weight: 99 lb 8 oz (45.1 kg)    GENERAL: Well-nourished well-developed; thin built; Alert, no distress and comfortable.   Accompanied by her mother.  EYES: no pallor or icterus OROPHARYNX: no thrush or ulceration; good dentition  NECK: supple, no masses felt LYMPH:  no palpable lymphadenopathy in the cervical, axillary or inguinal regions LUNGS: clear to auscultation and  No wheeze or crackles HEART/CVS: regular rate & rhythm and no murmurs; No lower extremity edema ABDOMEN: abdomen soft, non-tender and normal bowel sounds; surgical incisions noted. No infections. Musculoskeletal:no cyanosis of digits and no clubbing  PSYCH: alert & oriented x 3 with fluent speech NEURO: no  focal motor/sensory deficits SKIN:  no rashes or significant lesions  LABORATORY DATA:  I have reviewed the data as listed Lab Results  Component Value Date   WBC 6.5 02/14/2016   HGB 12.8 02/14/2016   HCT 38.3 02/14/2016   MCV 99.4 02/14/2016   PLT 39 (L) 02/14/2016    Recent Labs  01/19/16 0953 01/31/16 0846 02/14/16 0837  NA 138 141 140  K 3.6 3.7 3.6  CL 111 114* 113*  CO2 _0 GLUCOSE 91 97 102*  BUN _1 CREATININE 0.67 0.61 0.66  CALCIUM 8.8* 8.6* 9.0  GFRNONAA >60 >60 >60  GFRAA >60 >60 >60  PROT 6.6 5.9* 6.8  ALBUMIN 3.9 3.2* 3.8  AST 27 57* 29  ALT 30 75* 41  ALKPHOS 124 83 124  BILITOT 0.3 0.4 0.4  ASSESSMENT & PLAN:   .Cancer of ascending colon metastatic to intra-abdominal lymph node ([place]) # Colon cancer- cecal/ right-sided; STAGE IV [intraabdominal/retroperitonel LN/Supracla; K-ras mutated]. Currently on 5FU palliative chemotherapy+ avastin.   # Clinically no evidence of progression tolerating treatments fairly well except- low platelets 39; HOLD CHEMO today.   # recheck blood counts in 1 week; and platelets above 70-  Continue 5FU + avastin q 3 weeks.    # # Hypokalemia- from chemotherapy/ mild diarrhea- improved; 3.7-    # HTN- systolics 901Q; better at home as per pt.   # labs- 1 week; 5FU-Avastin'\; and then Follow-up with me in 4 weeks with chemotherapy Avastin    Cammie Sickle, MD 02/15/2016 8:10 AM

## 2016-02-16 ENCOUNTER — Inpatient Hospital Stay: Payer: BLUE CROSS/BLUE SHIELD

## 2016-02-21 ENCOUNTER — Other Ambulatory Visit: Payer: Self-pay | Admitting: Internal Medicine

## 2016-02-21 ENCOUNTER — Inpatient Hospital Stay: Payer: BLUE CROSS/BLUE SHIELD

## 2016-02-21 ENCOUNTER — Inpatient Hospital Stay: Payer: BLUE CROSS/BLUE SHIELD | Attending: Internal Medicine

## 2016-02-21 VITALS — BP 159/79 | HR 66 | Temp 95.9°F | Resp 18

## 2016-02-21 DIAGNOSIS — Z5112 Encounter for antineoplastic immunotherapy: Secondary | ICD-10-CM | POA: Diagnosis not present

## 2016-02-21 DIAGNOSIS — C182 Malignant neoplasm of ascending colon: Secondary | ICD-10-CM | POA: Insufficient documentation

## 2016-02-21 DIAGNOSIS — Z9049 Acquired absence of other specified parts of digestive tract: Secondary | ICD-10-CM | POA: Insufficient documentation

## 2016-02-21 DIAGNOSIS — I10 Essential (primary) hypertension: Secondary | ICD-10-CM | POA: Diagnosis not present

## 2016-02-21 DIAGNOSIS — R599 Enlarged lymph nodes, unspecified: Secondary | ICD-10-CM | POA: Insufficient documentation

## 2016-02-21 DIAGNOSIS — Z79899 Other long term (current) drug therapy: Secondary | ICD-10-CM | POA: Insufficient documentation

## 2016-02-21 DIAGNOSIS — Z800 Family history of malignant neoplasm of digestive organs: Secondary | ICD-10-CM | POA: Insufficient documentation

## 2016-02-21 DIAGNOSIS — R52 Pain, unspecified: Secondary | ICD-10-CM | POA: Diagnosis not present

## 2016-02-21 DIAGNOSIS — Z87891 Personal history of nicotine dependence: Secondary | ICD-10-CM | POA: Diagnosis not present

## 2016-02-21 DIAGNOSIS — C772 Secondary and unspecified malignant neoplasm of intra-abdominal lymph nodes: Secondary | ICD-10-CM | POA: Diagnosis not present

## 2016-02-21 DIAGNOSIS — Z5111 Encounter for antineoplastic chemotherapy: Secondary | ICD-10-CM | POA: Diagnosis not present

## 2016-02-21 DIAGNOSIS — D696 Thrombocytopenia, unspecified: Secondary | ICD-10-CM | POA: Insufficient documentation

## 2016-02-21 LAB — CBC WITH DIFFERENTIAL/PLATELET
BASOS ABS: 0.1 10*3/uL (ref 0–0.1)
Basophils Relative: 1 %
EOS PCT: 4 %
Eosinophils Absolute: 0.3 10*3/uL (ref 0–0.7)
HCT: 37.2 % (ref 35.0–47.0)
Hemoglobin: 12.5 g/dL (ref 12.0–16.0)
LYMPHS PCT: 24 %
Lymphs Abs: 1.4 10*3/uL (ref 1.0–3.6)
MCH: 33.1 pg (ref 26.0–34.0)
MCHC: 33.6 g/dL (ref 32.0–36.0)
MCV: 98.3 fL (ref 80.0–100.0)
MONO ABS: 0.7 10*3/uL (ref 0.2–0.9)
MONOS PCT: 12 %
Neutro Abs: 3.4 10*3/uL (ref 1.4–6.5)
Neutrophils Relative %: 59 %
PLATELETS: 55 10*3/uL — AB (ref 150–440)
RBC: 3.79 MIL/uL — ABNORMAL LOW (ref 3.80–5.20)
RDW: 17.4 % — AB (ref 11.5–14.5)
WBC: 5.9 10*3/uL (ref 3.6–11.0)

## 2016-02-21 LAB — PROTEIN, URINE, RANDOM: TOTAL PROTEIN, URINE: 15 mg/dL

## 2016-02-21 MED ORDER — SODIUM CHLORIDE 0.9 % IV SOLN
5.0000 mg/kg | Freq: Once | INTRAVENOUS | Status: AC
  Administered 2016-02-21: 225 mg via INTRAVENOUS
  Filled 2016-02-21: qty 9

## 2016-02-21 MED ORDER — HEPARIN SOD (PORK) LOCK FLUSH 100 UNIT/ML IV SOLN
500.0000 [IU] | Freq: Once | INTRAVENOUS | Status: AC | PRN
  Administered 2016-02-21: 500 [IU]
  Filled 2016-02-21: qty 5

## 2016-02-21 MED ORDER — SODIUM CHLORIDE 0.9 % IV SOLN
Freq: Once | INTRAVENOUS | Status: AC
  Administered 2016-02-21: 11:00:00 via INTRAVENOUS
  Filled 2016-02-21: qty 1000

## 2016-02-21 MED ORDER — SODIUM CHLORIDE 0.9% FLUSH
10.0000 mL | INTRAVENOUS | Status: DC | PRN
  Administered 2016-02-21: 10 mL
  Filled 2016-02-21: qty 10

## 2016-02-21 NOTE — Progress Notes (Signed)
HOLD 5FU infusion; continue avastin [platlets 55]

## 2016-02-21 NOTE — Progress Notes (Signed)
Platelet count 55,000 today. Per Dr Rogue Bussing hold 69fU today. Okay to give Avastin.

## 2016-02-23 ENCOUNTER — Inpatient Hospital Stay: Payer: BLUE CROSS/BLUE SHIELD

## 2016-03-07 DIAGNOSIS — C182 Malignant neoplasm of ascending colon: Secondary | ICD-10-CM | POA: Diagnosis not present

## 2016-03-13 ENCOUNTER — Inpatient Hospital Stay (HOSPITAL_BASED_OUTPATIENT_CLINIC_OR_DEPARTMENT_OTHER): Payer: BLUE CROSS/BLUE SHIELD | Admitting: Internal Medicine

## 2016-03-13 ENCOUNTER — Inpatient Hospital Stay: Payer: BLUE CROSS/BLUE SHIELD

## 2016-03-13 VITALS — BP 166/97 | HR 68 | Resp 18

## 2016-03-13 VITALS — BP 156/95 | HR 78 | Temp 96.2°F | Resp 18 | Wt 108.6 lb

## 2016-03-13 DIAGNOSIS — C182 Malignant neoplasm of ascending colon: Secondary | ICD-10-CM

## 2016-03-13 DIAGNOSIS — Z1239 Encounter for other screening for malignant neoplasm of breast: Secondary | ICD-10-CM

## 2016-03-13 DIAGNOSIS — R599 Enlarged lymph nodes, unspecified: Secondary | ICD-10-CM

## 2016-03-13 DIAGNOSIS — Z9049 Acquired absence of other specified parts of digestive tract: Secondary | ICD-10-CM

## 2016-03-13 DIAGNOSIS — Z87891 Personal history of nicotine dependence: Secondary | ICD-10-CM

## 2016-03-13 DIAGNOSIS — C772 Secondary and unspecified malignant neoplasm of intra-abdominal lymph nodes: Secondary | ICD-10-CM

## 2016-03-13 DIAGNOSIS — R52 Pain, unspecified: Secondary | ICD-10-CM

## 2016-03-13 DIAGNOSIS — Z800 Family history of malignant neoplasm of digestive organs: Secondary | ICD-10-CM

## 2016-03-13 DIAGNOSIS — Z79899 Other long term (current) drug therapy: Secondary | ICD-10-CM

## 2016-03-13 DIAGNOSIS — D696 Thrombocytopenia, unspecified: Secondary | ICD-10-CM

## 2016-03-13 DIAGNOSIS — I10 Essential (primary) hypertension: Secondary | ICD-10-CM

## 2016-03-13 LAB — COMPREHENSIVE METABOLIC PANEL
ALT: 36 U/L (ref 14–54)
AST: 42 U/L — AB (ref 15–41)
Albumin: 3.8 g/dL (ref 3.5–5.0)
Alkaline Phosphatase: 115 U/L (ref 38–126)
Anion gap: 9 (ref 5–15)
BUN: 18 mg/dL (ref 6–20)
CHLORIDE: 108 mmol/L (ref 101–111)
CO2: 23 mmol/L (ref 22–32)
Calcium: 8.8 mg/dL — ABNORMAL LOW (ref 8.9–10.3)
Creatinine, Ser: 0.91 mg/dL (ref 0.44–1.00)
GFR calc Af Amer: >60 mL/min (ref >60)
Glucose, Bld: 124 mg/dL — ABNORMAL HIGH (ref 65–99)
POTASSIUM: 3.9 mmol/L (ref 3.5–5.1)
Sodium: 140 mmol/L (ref 135–145)
Total Bilirubin: 0.5 mg/dL (ref 0.3–1.2)
Total Protein: 7 g/dL (ref 6.5–8.1)

## 2016-03-13 LAB — CBC WITH DIFFERENTIAL/PLATELET
BASOS ABS: 0.1 10*3/uL (ref 0–0.1)
BASOS PCT: 1 %
EOS PCT: 2 %
Eosinophils Absolute: 0.1 10*3/uL (ref 0–0.7)
HCT: 40 % (ref 35.0–47.0)
Hemoglobin: 13.4 g/dL (ref 12.0–16.0)
Lymphocytes Relative: 28 %
Lymphs Abs: 1.6 10*3/uL (ref 1.0–3.6)
MCH: 32.3 pg (ref 26.0–34.0)
MCHC: 33.4 g/dL (ref 32.0–36.0)
MCV: 96.6 fL (ref 80.0–100.0)
MONO ABS: 0.6 10*3/uL (ref 0.2–0.9)
Monocytes Relative: 11 %
Neutro Abs: 3.3 10*3/uL (ref 1.4–6.5)
Neutrophils Relative %: 58 %
PLATELETS: 67 10*3/uL — AB (ref 150–440)
RBC: 4.14 MIL/uL (ref 3.80–5.20)
RDW: 16.4 % — AB (ref 11.5–14.5)
WBC: 5.7 10*3/uL (ref 3.6–11.0)

## 2016-03-13 MED ORDER — SODIUM CHLORIDE 0.9% FLUSH
10.0000 mL | INTRAVENOUS | Status: DC | PRN
  Administered 2016-03-13: 10 mL
  Filled 2016-03-13: qty 10

## 2016-03-13 MED ORDER — BEVACIZUMAB CHEMO INJECTION 400 MG/16ML
5.0000 mg/kg | Freq: Once | INTRAVENOUS | Status: AC
  Administered 2016-03-13: 225 mg via INTRAVENOUS
  Filled 2016-03-13: qty 9

## 2016-03-13 MED ORDER — SODIUM CHLORIDE 0.9 % IV SOLN
Freq: Once | INTRAVENOUS | Status: AC
  Administered 2016-03-13: 20 mL/h via INTRAVENOUS
  Filled 2016-03-13: qty 1000

## 2016-03-13 MED ORDER — SODIUM CHLORIDE 0.9 % IV SOLN: Freq: Once | INTRAVENOUS | Status: DC

## 2016-03-13 MED ORDER — HYDROCODONE-ACETAMINOPHEN 5-325 MG PO TABS: 1.0000 | ORAL_TABLET | Freq: Three times a day (TID) | ORAL | 0 refills | Status: DC | PRN

## 2016-03-13 MED ORDER — DEXAMETHASONE SODIUM PHOSPHATE 10 MG/ML IJ SOLN
10.0000 mg | Freq: Once | INTRAMUSCULAR | Status: AC
  Administered 2016-03-13: 10 mg via INTRAVENOUS
  Filled 2016-03-13: qty 1

## 2016-03-13 MED ORDER — HEPARIN SOD (PORK) LOCK FLUSH 100 UNIT/ML IV SOLN
500.0000 [IU] | Freq: Once | INTRAVENOUS | Status: DC | PRN
  Filled 2016-03-13: qty 5

## 2016-03-13 MED ORDER — LEUCOVORIN CALCIUM INJECTION 100 MG
20.0000 mg/m2 | Freq: Once | INTRAMUSCULAR | Status: AC
  Administered 2016-03-13: 28 mg via INTRAVENOUS
  Filled 2016-03-13: qty 1.4

## 2016-03-13 MED ORDER — ONDANSETRON 8 MG PO TBDP
8.0000 mg | ORAL_TABLET | Freq: Once | ORAL | Status: AC
  Administered 2016-03-13: 8 mg via ORAL
  Filled 2016-03-13: qty 1

## 2016-03-13 MED ORDER — SODIUM CHLORIDE 0.9 % IV SOLN
2400.0000 mg/m2 | INTRAVENOUS | Status: DC
  Administered 2016-03-13: 3350 mg via INTRAVENOUS
  Filled 2016-03-13: qty 67

## 2016-03-13 MED ORDER — BENAZEPRIL-HYDROCHLOROTHIAZIDE 20-12.5 MG PO TABS: 1.0000 | ORAL_TABLET | Freq: Every day | ORAL | 3 refills | Status: DC

## 2016-03-13 MED ORDER — LEUCOVORIN CALCIUM INJECTION 350 MG: 400.0000 mg/m2 | Freq: Once | INTRAVENOUS | Status: DC

## 2016-03-13 NOTE — Assessment & Plan Note (Addendum)
#  Colon cancer- cecal/ right-sided; STAGE IV [intraabdominal/retroperitonel LN/Supracla; K-ras mutated]. Currently on 5FU palliative chemotherapy+ avastin.   # Clinically no evidence of progression tolerating treatments fairly well except- low platelets 67/elevated Blood pressure; proceed with 5FU+ Avastin today.    # HTN- systolics 255Q/01U; at home also in 150s; recommend re-starting anti-HTN; ben-HCTZ. Recommend watching for orthostasis.   # pain sec to trauma- new script given.   #  Follow-up with me in 3 weeks with chemotherapy Avastin; UA.

## 2016-03-13 NOTE — Progress Notes (Signed)
[place] CONSULT NOTE  Patient Care Team: Coral Spikes, DO as PCP - General (Family Medicine)  CHIEF COMPLAINTS/PURPOSE OF CONSULTATION:   Oncology History   # [facility] 2017- COLON CANCER- STAGE IV [s/p right hemi-colectomy; pT4apN2 (7/19LN)]; Pre-op CEA- [id number]  PET-  mesenteric adenopathy/mediastinal adenopathy/right-sided subclavicular lymph node.    April 17th -START FOLFOX; May 1st Add Avastin; 12/03/15-CT C/A/P-near CR  Aug 16th- Disc OX [sec PN]; con 5FU-Avastin   # MOLECULAR TESTING: K-ras-EXON 2- MUTATED; MSI- STABLE.      Cancer of ascending colon metastatic to intra-abdominal lymph node ([place])   09/06/2015 Initial Diagnosis    Cancer of ascending colon metastatic to intra-abdominal lymph node (HCC)        HISTORY OF PRESENTING ILLNESS:  Nancy Clark 55 y.o.  female  with a diagnosed Metastatic right-sided colon cancer status post 5-FU plus Avastin.   Patient's appetite is good. She is gained some weight. No nausea no vomiting. No bleeding. No chest pain or shortness of breath or cough. Denies any tingling and numbness. No headaches. No epistaxis. No sores in the mouth.  ROS: A complete 10 point review of system is done which is negative except mentioned above in history of present illness  MEDICAL HISTORY:  Past Medical History:  Diagnosis Date  . Chicken pox   . Colon cancer [facility])     SURGICAL HISTORY: Past Surgical History:  Procedure Laterality Date  . BREAST BIOPSY Right    2007? pt unsure done by [facility]  . COLONOSCOPY    . COLONOSCOPY WITH PROPOFOL N/A 10/14/2015   Procedure: COLONOSCOPY WITH PROPOFOL;  Surgeon: Hulen Luster, MD;  Location: [facility] ENDOSCOPY;  Service: Endoscopy;  Laterality: N/A;  . COLOSTOMY REVISION Right 08/09/2015   Procedure: COLON RESECTION RIGHT;  Surgeon: Florene Glen, MD;  Location: [facility] ORS;  Service: General;  Laterality: Right;  . ECTOPIC PREGNANCY SURGERY    . PORTACATH PLACEMENT N/A 09/01/2015   Procedure: INSERTION PORT-A-CATH;  Surgeon: Florene Glen, MD;  Location: [facility] ORS;  Service: General;  Laterality: N/A;  . 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  1 pack in 3 days; no alcohol. 2 childern [25 & 17 years] Social History   Social History  . Marital status: Married    Spouse name: N/A  . Number of children: N/A  . Years of education: N/A   Occupational History  . Not on file.   Social History Main Topics  . Smoking status: Former Smoker    Packs/day: 0.25    Years: 2.00  . Smokeless tobacco: Never Used     Comment: recently quit  . Alcohol use No  . Drug use: No  . Sexual activity: Yes   Other Topics Concern  . Not on file   Social History Narrative  . No narrative on file    FAMILY HISTORY: 1 brother& 1 sister- no cancer. Mom-no cancer Family History  Problem Relation Age of Onset  . Hypertension Mother   . Arthritis Father   . Prostate cancer Father   . Diabetes Maternal Grandmother   . Colon cancer Maternal Grandfather     colon    ALLERGIES:  has No Known Allergies.  MEDICATIONS:  Current Outpatient Prescriptions  Medication Sig Dispense Refill  . feeding supplement (BOOST HIGH PROTEIN) LIQD Take 1 Container by mouth daily.    . ferrous sulfate (FERROUSUL) 325 (65 FE) MG tablet Take 1 tablet (  325 mg total) by mouth daily with breakfast. 90 tablet 3  . lidocaine-prilocaine (EMLA) cream Apply 1 application topically as needed. Apply generously over the Mediport 45 minutes prior to chemotherapy. 30 g 0  . Multiple Vitamins-Minerals (MULTIVITAMIN ADULT PO) Take 1 tablet by mouth daily.    . potassium chloride SA (K-DUR,KLOR-CON) 20 MEQ tablet Take 2 tablets (40 mEq total) by mouth 2 (two) times daily. (Patient taking differently: Take 20 mEq by mouth daily. ) 60 tablet 3  . acetaminophen (TYLENOL) 325 MG tablet Take 2 tablets (650 mg total) by mouth every 6 (six) hours as needed for mild pain (or Fever >/=  101). (Patient not taking: Reported on 03/13/2016)    . benazepril-hydrochlorthiazide (LOTENSIN HCT) 20-12.5 MG tablet Take 1 tablet by mouth daily. 60 tablet 3  . HYDROcodone-acetaminophen (NORCO/VICODIN) 5-325 MG tablet Take 1 tablet by mouth every 8 (eight) hours as needed for moderate pain or severe pain. 25 tablet 0  . ondansetron (ZOFRAN) 8 MG tablet Take 1 tablet (8 mg total) by mouth every 8 (eight) hours as needed for nausea or vomiting (start 3 days; after chemo). (Patient not taking: Reported on 03/13/2016) 40 tablet 0  . prochlorperazine (COMPAZINE) 10 MG tablet Take 1 tablet (10 mg total) by mouth every 6 (six) hours as needed for nausea or vomiting. (Patient not taking: Reported on 03/13/2016) 30 tablet 0   No current facility-administered medications for this visit.    Facility-Administered Medications Ordered in Other Visits  Medication Dose Route Frequency Provider Last Rate Last Dose  . sodium chloride flush (NS) 0.9 % injection 10 mL  10 mL Intravenous PRN Cammie Sickle, MD   10 mL at 10/04/15 0901      .  PHYSICAL EXAMINATION: ECOG PERFORMANCE STATUS: 0 - Asymptomatic  Vitals:   03/13/16 0911  BP: (!) 156/95  Pulse: 78  Resp: 18  Temp: (!) 96.2 F (35.7 C)   Filed Weights   03/13/16 0911  Weight: 108 lb 9.6 oz (49.3 kg)    GENERAL: Well-nourished well-developed; thin built; Alert, no distress and comfortable.   Accompanied by her mother.  EYES: no pallor or icterus OROPHARYNX: no thrush or ulceration; good dentition  NECK: supple, no masses felt LYMPH:  no palpable lymphadenopathy in the cervical, axillary or inguinal regions LUNGS: clear to auscultation and  No wheeze or crackles HEART/CVS: regular rate & rhythm and no murmurs; No lower extremity edema ABDOMEN: abdomen soft, non-tender and normal bowel sounds; surgical incisions noted. No infections. Musculoskeletal:no cyanosis of digits and no clubbing  PSYCH: alert & oriented x 3 with fluent  speech NEURO: no focal motor/sensory deficits SKIN:  no rashes or significant lesions  LABORATORY DATA:  I have reviewed the data as listed Lab Results  Component Value Date   WBC 5.7 03/13/2016   HGB 13.4 03/13/2016   HCT 40.0 03/13/2016   MCV 96.6 03/13/2016   PLT 67 (L) 03/13/2016    Recent Labs  01/31/16 0846 02/14/16 0837 03/13/16 0841  NA 141 140 140  K 3.7 3.6 3.9  CL 114* 113* 108  CO2 _0 GLUCOSE 97 102* 124*  BUN _1 CREATININE 0.61 0.66 0.91  CALCIUM 8.6* 9.0 8.8*  GFRNONAA >60 >60 >60  GFRAA >60 >60 >60  PROT 5.9* 6.8 7.0  ALBUMIN 3.2* 3.8 3.8  AST 57* 29 42*  ALT 75* 41 36  ALKPHOS 83 124 115  BILITOT 0.4 0.4 0.5  ASSESSMENT & PLAN:   .Cancer of ascending colon metastatic to intra-abdominal lymph node ([place]) # Colon cancer- cecal/ right-sided; STAGE IV [intraabdominal/retroperitonel LN/Supracla; K-ras mutated]. Currently on 5FU palliative chemotherapy+ avastin.   # Clinically no evidence of progression tolerating treatments fairly well except- low platelets 67/elevated Blood pressure; proceed with 5FU+ Avastin today.    # HTN- systolics 007M/22Q; at home also in 150s; recommend re-starting anti-HTN; ben-HCTZ. Recommend watching for orthostasis.   # pain sec to trauma- new script given.   #  Follow-up with me in 3 weeks with chemotherapy Avastin; UA.     Cammie Sickle, MD 03/16/2016 5:36 PM

## 2016-03-13 NOTE — Progress Notes (Signed)
Patient is here today for follow up, she mentions last week she had an accident. A ladder fell on her back. She mentions she has pain in her neck and shoulders. 8/10 for the pain she also needs rx for her pain medication.

## 2016-03-14 ENCOUNTER — Ambulatory Visit: Payer: BLUE CROSS/BLUE SHIELD | Admitting: Dietician

## 2016-03-15 ENCOUNTER — Inpatient Hospital Stay: Payer: BLUE CROSS/BLUE SHIELD

## 2016-03-15 DIAGNOSIS — C182 Malignant neoplasm of ascending colon: Secondary | ICD-10-CM

## 2016-03-15 DIAGNOSIS — C772 Secondary and unspecified malignant neoplasm of intra-abdominal lymph nodes: Principal | ICD-10-CM

## 2016-03-15 MED ORDER — HEPARIN SOD (PORK) LOCK FLUSH 100 UNIT/ML IV SOLN
500.0000 [IU] | Freq: Once | INTRAVENOUS | Status: AC | PRN
  Administered 2016-03-15: 500 [IU]
  Filled 2016-03-15: qty 5

## 2016-03-20 ENCOUNTER — Encounter: Payer: Self-pay | Attending: Internal Medicine | Admitting: Dietician

## 2016-03-20 DIAGNOSIS — R634 Abnormal weight loss: Secondary | ICD-10-CM

## 2016-03-20 DIAGNOSIS — C189 Malignant neoplasm of colon, unspecified: Secondary | ICD-10-CM

## 2016-03-20 NOTE — Progress Notes (Signed)
Medical Nutrition Therapy Follow-up visit:  Time: 9:05-9:30 Visit #:4 ASSESSMENT:  Diagnosis: weight loss, colon cancer  Current weight:102.5 (with shoes as with previous weights)    Height: 60 in Medications: See list Progress and evaluation: Patient in for medical nutrition therapy follow-up. She reports her appetite is back to level it was- pre cancer treatments. She reports that her oncologist has adjusted chemo treatment and she feels this has helped significantly. She continues to eat 3 meals per day including 5-6 oz.daily total of protein food at those meals. In addition she continues to drink Gaterade protein supplement 2 x per day which adds 40 additional grams of protein for a range of 80 to 85 gms of protein daily once additional foods are added in.  She is including 1-2 servings of vegetables at her evening meal. She continues to take a mult-vitamin. She has gained 4.7 lbs in the past month. Her present weight is 89% of her goal weight of 115 lbs; an increase from 78% 3 months ago.  NUTRITION CARE EDUCATION: Basic nutrition:  Discussed how present meal pattern is meeting calorie needs for weight gain and is also meeting protein needs. Discussed how fruits and vegetables add not only vitamins and minerals but other beneficial plant chemicals.  INTERVENTION:  Continue with previous goals set. Continue with 3 meals with supplements in between. Include soft cooked fruits or canned fruits to increase nutrients in diet.   EDUCATION MATERIALS GIVEN:  . Goals/ instructions         Samples of high calorie Boost if not able to eat a        meal at any point after treatment. LEARNER/ who was taught:  . Patient   LEVEL OF UNDERSTANDING: . Verbalizes/ demonstrates competency LEARNING BARRIERS: . None WILLINGNESS TO LEARN/READINESS FOR CHANGE: . Eager, change in progress  MONITORING AND EVALUATION:  No follow-up scheduled at this time. Patient to call if experiences lack of  weight gain or weight loss to set up follow-up appointment.

## 2016-04-03 ENCOUNTER — Inpatient Hospital Stay: Payer: BLUE CROSS/BLUE SHIELD

## 2016-04-03 ENCOUNTER — Inpatient Hospital Stay: Payer: BLUE CROSS/BLUE SHIELD | Attending: Internal Medicine | Admitting: Internal Medicine

## 2016-04-03 VITALS — BP 120/82 | HR 92 | Temp 97.8°F | Resp 20 | Ht 60.0 in | Wt 104.4 lb

## 2016-04-03 DIAGNOSIS — C182 Malignant neoplasm of ascending colon: Secondary | ICD-10-CM | POA: Diagnosis not present

## 2016-04-03 DIAGNOSIS — Z9049 Acquired absence of other specified parts of digestive tract: Secondary | ICD-10-CM | POA: Insufficient documentation

## 2016-04-03 DIAGNOSIS — Z8042 Family history of malignant neoplasm of prostate: Secondary | ICD-10-CM | POA: Diagnosis not present

## 2016-04-03 DIAGNOSIS — Z5111 Encounter for antineoplastic chemotherapy: Secondary | ICD-10-CM | POA: Diagnosis not present

## 2016-04-03 DIAGNOSIS — C772 Secondary and unspecified malignant neoplasm of intra-abdominal lymph nodes: Principal | ICD-10-CM

## 2016-04-03 DIAGNOSIS — R599 Enlarged lymph nodes, unspecified: Secondary | ICD-10-CM | POA: Diagnosis not present

## 2016-04-03 DIAGNOSIS — R200 Anesthesia of skin: Secondary | ICD-10-CM | POA: Insufficient documentation

## 2016-04-03 DIAGNOSIS — Z79899 Other long term (current) drug therapy: Secondary | ICD-10-CM | POA: Insufficient documentation

## 2016-04-03 DIAGNOSIS — Z800 Family history of malignant neoplasm of digestive organs: Secondary | ICD-10-CM | POA: Diagnosis not present

## 2016-04-03 DIAGNOSIS — I10 Essential (primary) hypertension: Secondary | ICD-10-CM | POA: Diagnosis not present

## 2016-04-03 DIAGNOSIS — D696 Thrombocytopenia, unspecified: Secondary | ICD-10-CM | POA: Insufficient documentation

## 2016-04-03 DIAGNOSIS — Z87891 Personal history of nicotine dependence: Secondary | ICD-10-CM | POA: Diagnosis not present

## 2016-04-03 LAB — URINALYSIS COMPLETE WITH MICROSCOPIC (ARMC ONLY)
Bilirubin Urine: NEGATIVE
Glucose, UA: NEGATIVE mg/dL
HGB URINE DIPSTICK: NEGATIVE
Ketones, ur: NEGATIVE mg/dL
Nitrite: NEGATIVE
PH: 5 (ref 5.0–8.0)
Protein, ur: NEGATIVE mg/dL
Specific Gravity, Urine: 1.016 (ref 1.005–1.030)

## 2016-04-03 LAB — COMPREHENSIVE METABOLIC PANEL
ALBUMIN: 4.1 g/dL (ref 3.5–5.0)
ALK PHOS: 103 U/L (ref 38–126)
ALT: 19 U/L (ref 14–54)
ANION GAP: 8 (ref 5–15)
AST: 23 U/L (ref 15–41)
BUN: 20 mg/dL (ref 6–20)
CALCIUM: 9 mg/dL (ref 8.9–10.3)
CO2: 24 mmol/L (ref 22–32)
Chloride: 104 mmol/L (ref 101–111)
Creatinine, Ser: 0.82 mg/dL (ref 0.44–1.00)
GFR calc Af Amer: >60 mL/min (ref >60)
GFR calc non Af Amer: >60 mL/min (ref >60)
GLUCOSE: 106 mg/dL — AB (ref 65–99)
Potassium: 3.6 mmol/L (ref 3.5–5.1)
SODIUM: 136 mmol/L (ref 135–145)
Total Bilirubin: 0.3 mg/dL (ref 0.3–1.2)
Total Protein: 7.3 g/dL (ref 6.5–8.1)

## 2016-04-03 LAB — CBC WITH DIFFERENTIAL/PLATELET
BASOS ABS: 0.1 10*3/uL (ref 0–0.1)
BASOS PCT: 1 %
EOS ABS: 0.1 10*3/uL (ref 0–0.7)
Eosinophils Relative: 2 %
HCT: 42.4 % (ref 35.0–47.0)
HEMOGLOBIN: 14.2 g/dL (ref 12.0–16.0)
Lymphocytes Relative: 31 %
Lymphs Abs: 1.6 10*3/uL (ref 1.0–3.6)
MCH: 31.5 pg (ref 26.0–34.0)
MCHC: 33.5 g/dL (ref 32.0–36.0)
MCV: 93.8 fL (ref 80.0–100.0)
MONOS PCT: 10 %
Monocytes Absolute: 0.5 10*3/uL (ref 0.2–0.9)
NEUTROS ABS: 2.8 10*3/uL (ref 1.4–6.5)
NEUTROS PCT: 56 %
Platelets: 62 10*3/uL — ABNORMAL LOW (ref 150–440)
RBC: 4.52 MIL/uL (ref 3.80–5.20)
RDW: 15.9 % — ABNORMAL HIGH (ref 11.5–14.5)
WBC: 5 10*3/uL (ref 3.6–11.0)

## 2016-04-03 MED ORDER — SODIUM CHLORIDE 0.9 % IV SOLN: Freq: Once | INTRAVENOUS | Status: DC

## 2016-04-03 MED ORDER — ONDANSETRON 8 MG PO TBDP
8.0000 mg | ORAL_TABLET | Freq: Once | ORAL | Status: AC
  Administered 2016-04-03: 8 mg via ORAL
  Filled 2016-04-03: qty 1

## 2016-04-03 MED ORDER — DEXAMETHASONE SODIUM PHOSPHATE 10 MG/ML IJ SOLN
10.0000 mg | Freq: Once | INTRAMUSCULAR | Status: AC
  Administered 2016-04-03: 10 mg via INTRAVENOUS
  Filled 2016-04-03: qty 3
  Filled 2016-04-03: qty 1

## 2016-04-03 MED ORDER — GABAPENTIN 300 MG PO CAPS: 300.0000 mg | ORAL_CAPSULE | Freq: Every day | ORAL | 3 refills | Status: DC

## 2016-04-03 MED ORDER — SODIUM CHLORIDE 0.9 % IV SOLN
2400.0000 mg/m2 | INTRAVENOUS | Status: DC
  Administered 2016-04-03: 3350 mg via INTRAVENOUS
  Filled 2016-04-03: qty 67

## 2016-04-03 MED ORDER — SODIUM CHLORIDE 0.9 % IV SOLN
Freq: Once | INTRAVENOUS | Status: AC
  Administered 2016-04-03: 10:00:00 via INTRAVENOUS
  Filled 2016-04-03: qty 1000

## 2016-04-03 MED ORDER — LEUCOVORIN CALCIUM INJECTION 100 MG
20.0000 mg/m2 | Freq: Once | INTRAMUSCULAR | Status: AC
Start: 2016-04-03 — End: 2016-04-03
  Administered 2016-04-03: 28 mg via INTRAVENOUS
  Filled 2016-04-03: qty 1.4

## 2016-04-03 MED ORDER — SODIUM CHLORIDE 0.9 % IV SOLN
5.0000 mg/kg | Freq: Once | INTRAVENOUS | Status: AC
  Administered 2016-04-03: 225 mg via INTRAVENOUS
  Filled 2016-04-03: qty 9

## 2016-04-03 MED ORDER — DEXTROSE 5 % IV SOLN: 400.0000 mg/m2 | Freq: Once | INTRAVENOUS | Status: DC

## 2016-04-03 NOTE — Patient Instructions (Signed)
Gabapentin capsules or tablets What is this medicine? GABAPENTIN (GA ba pen tin) is used to control partial seizures in adults with epilepsy. It is also used to treat certain types of nerve pain. This medicine may be used for other purposes; ask your health care provider or pharmacist if you have questions. What should I tell my health care provider before I take this medicine? They need to know if you have any of these conditions: -kidney disease -suicidal thoughts, plans, or attempt; a previous suicide attempt by you or a family member -an unusual or allergic reaction to gabapentin, other medicines, foods, dyes, or preservatives -pregnant or trying to get pregnant -breast-feeding How should I use this medicine? Take this medicine by mouth with a glass of water. Follow the directions on the prescription label. You can take it with or without food. If it upsets your stomach, take it with food.Take your medicine at regular intervals. Do not take it more often than directed. Do not stop taking except on your doctor's advice. If you are directed to break the 600 or 800 mg tablets in half as part of your dose, the extra half tablet should be used for the next dose. If you have not used the extra half tablet within 28 days, it should be thrown away. A special MedGuide will be given to you by the pharmacist with each prescription and refill. Be sure to read this information carefully each time. Talk to your pediatrician regarding the use of this medicine in children. Special care may be needed. Overdosage: If you think you have taken too much of this medicine contact a poison control center or emergency room at once. NOTE: This medicine is only for you. Do not share this medicine with others. What if I miss a dose? If you miss a dose, take it as soon as you can. If it is almost time for your next dose, take only that dose. Do not take double or extra doses. What may interact with this medicine? Do not  take this medicine with any of the following medications: -other gabapentin products This medicine may also interact with the following medications: -alcohol -antacids -antihistamines for allergy, cough and cold -certain medicines for anxiety or sleep -certain medicines for depression or psychotic disturbances -homatropine; hydrocodone -naproxen -narcotic medicines (opiates) for pain -phenothiazines like chlorpromazine, mesoridazine, prochlorperazine, thioridazine This list may not describe all possible interactions. Give your health care provider a list of all the medicines, herbs, non-prescription drugs, or dietary supplements you use. Also tell them if you smoke, drink alcohol, or use illegal drugs. Some items may interact with your medicine. What should I watch for while using this medicine? Visit your doctor or health care professional for regular checks on your progress. You may want to keep a record at home of how you feel your condition is responding to treatment. You may want to share this information with your doctor or health care professional at each visit. You should contact your doctor or health care professional if your seizures get worse or if you have any new types of seizures. Do not stop taking this medicine or any of your seizure medicines unless instructed by your doctor or health care professional. Stopping your medicine suddenly can increase your seizures or their severity. Wear a medical identification bracelet or chain if you are taking this medicine for seizures, and carry a card that lists all your medications. You may get drowsy, dizzy, or have blurred vision. Do not drive, use   machinery, or do anything that needs mental alertness until you know how this medicine affects you. To reduce dizzy or fainting spells, do not sit or stand up quickly, especially if you are an older patient. Alcohol can increase drowsiness and dizziness. Avoid alcoholic drinks. Your mouth may get  dry. Chewing sugarless gum or sucking hard candy, and drinking plenty of water will help. The use of this medicine may increase the chance of suicidal thoughts or actions. Pay special attention to how you are responding while on this medicine. Any worsening of mood, or thoughts of suicide or dying should be reported to your health care professional right away. Women who become pregnant while using this medicine may enroll in the [place] Pregnancy Registry by calling [phone]. This registry collects information about the safety of antiepileptic drug use during pregnancy. What side effects may I notice from receiving this medicine? Side effects that you should report to your doctor or health care professional as soon as possible: -allergic reactions like skin rash, itching or hives, swelling of the face, lips, or tongue -worsening of mood, thoughts or actions of suicide or dying Side effects that usually do not require medical attention (report to your doctor or health care professional if they continue or are bothersome): -constipation -difficulty walking or controlling muscle movements -dizziness -nausea -slurred speech -tiredness -tremors -weight gain This list may not describe all possible side effects. Call your doctor for medical advice about side effects. You may report side effects to FDA at 1-800-FDA-1088. Where should I keep my medicine? Keep out of reach of children. This medicine may cause accidental overdose and death if it taken by other adults, children, or pets. Mix any unused medicine with a substance like cat litter or coffee grounds. Then throw the medicine away in a sealed container like a sealed bag or a coffee can with a lid. Do not use the medicine after the expiration date. Store at room temperature between 15 and 30 degrees C (59 and 86 degrees F). NOTE: This sheet is a summary. It may not cover all possible information. If you have  questions about this medicine, talk to your doctor, pharmacist, or health care provider.    2016, Elsevier/Gold Standard. (2013-07-04 15:26:50)    Peripheral Neuropathy Peripheral neuropathy is a type of nerve damage. It affects nerves that carry signals between the spinal cord and other parts of the body. These are called peripheral nerves. With peripheral neuropathy, one nerve or a group of nerves may be damaged.  CAUSES  Many things can damage peripheral nerves. For some people with peripheral neuropathy, the cause is unknown. Some causes include:  Diabetes. This is the most common cause of peripheral neuropathy.  Injury to a nerve.  Pressure or stress on a nerve that lasts a long time.  Too little vitamin B. Alcoholism can lead to this.  Infections.  Autoimmune diseases, such as multiple sclerosis and systemic lupus erythematosus.  Inherited nerve diseases.  Some medicines, such as cancer drugs.  Toxic substances, such as lead and mercury.  Too little blood flowing to the legs.  Kidney disease.  Thyroid disease. SIGNS AND SYMPTOMS  Different people have different symptoms. The symptoms you have will depend on which of your nerves is damaged. Common symptoms include:  Loss of feeling (numbness) in the feet and hands.  Tingling in the feet and hands.  Pain that burns.  Very sensitive skin.  Weakness.  Not being able to move a part of  the body (paralysis).  Muscle twitching.  Clumsiness or poor coordination.  Loss of balance.  Not being able to control your bladder.  Feeling dizzy.  Sexual problems. DIAGNOSIS  Peripheral neuropathy is a symptom, not a disease. Finding the cause of peripheral neuropathy can be hard. To figure that out, your health care provider will take a medical history and do a physical exam. A neurological exam will also be done. This involves checking things affected by your brain, spinal cord, and nerves (nervous system). For  example, your health care provider will check your reflexes, how you move, and what you can feel.  Other types of tests may also be ordered, such as:  Blood tests.  A test of the fluid in your spinal cord.  Imaging tests, such as CT scans or an MRI.  Electromyography (EMG). This test checks the nerves that control muscles.  Nerve conduction velocity tests. These tests check how fast messages pass through your nerves.  Nerve biopsy. A small piece of nerve is removed. It is then checked under a microscope. TREATMENT   Medicine is often used to treat peripheral neuropathy. Medicines may include:  Pain-relieving medicines. Prescription or over-the-counter medicine may be suggested.  Antiseizure medicine. This may be used for pain.  Antidepressants. These also may help ease pain from neuropathy.  Lidocaine. This is a numbing medicine. You might wear a patch or be given a shot.  Mexiletine. This medicine is typically used to help control irregular heart rhythms.  Surgery. Surgery may be needed to relieve pressure on a nerve or to destroy a nerve that is causing pain.  Physical therapy to help movement.  Assistive devices to help movement. HOME CARE INSTRUCTIONS   Only take over-the-counter or prescription medicines as directed by your health care provider. Follow the instructions carefully for any given medicines. Do not take any other medicines without first getting approval from your health care provider.  If you have diabetes, work closely with your health care provider to keep your blood sugar under control.  If you have numbness in your feet:  Check every day for signs of injury or infection. Watch for redness, warmth, and swelling.  Wear padded socks and comfortable shoes. These help protect your feet.  Do not do things that put pressure on your damaged nerve.  Do not smoke. Smoking keeps blood from getting to damaged nerves.  Avoid or limit alcohol. Too much alcohol  can cause a lack of B vitamins. These vitamins are needed for healthy nerves.  Develop a good support system. Coping with peripheral neuropathy can be stressful. Talk to a mental health specialist or join a support group if you are struggling.  Follow up with your health care provider as directed. SEEK MEDICAL CARE IF:   You have new signs or symptoms of peripheral neuropathy.  You are struggling emotionally from dealing with peripheral neuropathy.  You have a fever. SEEK IMMEDIATE MEDICAL CARE IF:   You have an injury or infection that is not healing.  You feel very dizzy or begin vomiting.  You have chest pain.  You have trouble breathing.   This information is not intended to replace advice given to you by your health care provider. Make sure you discuss any questions you have with your health care provider.   Document Released: 04/28/2002 Document Revised: 01/18/2011 Document Reviewed: 01/13/2013 Elsevier Interactive Patient Education [employer].

## 2016-04-03 NOTE — Progress Notes (Signed)
[place] CONSULT NOTE  Patient Care Team: Coral Spikes, DO as PCP - General (Family Medicine)  CHIEF COMPLAINTS/PURPOSE OF CONSULTATION:   Oncology History   # [facility] 2017- COLON CANCER- STAGE IV [s/p right hemi-colectomy; pT4apN2 (7/19LN)]; Pre-op CEA- [id number]  PET-  mesenteric adenopathy/mediastinal adenopathy/right-sided subclavicular lymph node.    April 17th -START FOLFOX; May 1st Add Avastin; 12/03/15-CT C/A/P-near CR  Aug 16th- Disc OX [sec PN]; con 5FU-Avastin   # MOLECULAR TESTING: K-ras-EXON 2- MUTATED; MSI- STABLE.      Cancer of ascending colon metastatic to intra-abdominal lymph node ([place])   09/06/2015 Initial Diagnosis    Cancer of ascending colon metastatic to intra-abdominal lymph node ([facility])        HISTORY OF PRESENTING ILLNESS:  Nancy Clark 55 y.o.  female  with a diagnosed Metastatic right-sided colon cancer status post 5-FU plus Avastin; Is here for follow-up.  Patient notes to have intermittent tingling and numbness in her hand and feet. Denies any headaches or nosebleeds. Patient's appetite is good. She is gained some weight. No nausea no vomiting. No bleeding. No chest pain or shortness of breath or cough. No epistaxis. No sores in the mouth.  ROS: A complete 10 point review of system is done which is negative except mentioned above in history of present illness  MEDICAL HISTORY:  Past Medical History:  Diagnosis Date  . Chicken pox   . Colon cancer [facility])     SURGICAL HISTORY: Past Surgical History:  Procedure Laterality Date  . BREAST BIOPSY Right    2007? pt unsure done by [facility]  . COLONOSCOPY    . COLONOSCOPY WITH PROPOFOL N/A 10/14/2015   Procedure: COLONOSCOPY WITH PROPOFOL;  Surgeon: Hulen Luster, MD;  Location: [facility] ENDOSCOPY;  Service: Endoscopy;  Laterality: N/A;  . COLOSTOMY REVISION Right 08/09/2015   Procedure: COLON RESECTION RIGHT;  Surgeon: Florene Glen, MD;  Location: [facility] ORS;  Service: General;   Laterality: Right;  . ECTOPIC PREGNANCY SURGERY    . PORTACATH PLACEMENT N/A 09/01/2015   Procedure: INSERTION PORT-A-CATH;  Surgeon: Florene Glen, MD;  Location: [facility] ORS;  Service: General;  Laterality: N/A;  . 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  1 pack in 3 days; no alcohol. 2 childern [25 & 17 years] Social History   Social History  . Marital status: Married    Spouse name: N/A  . Number of children: N/A  . Years of education: N/A   Occupational History  . Not on file.   Social History Main Topics  . Smoking status: Former Smoker    Packs/day: 0.25    Years: 2.00  . Smokeless tobacco: Never Used     Comment: recently quit  . Alcohol use No  . Drug use: No  . Sexual activity: Yes   Other Topics Concern  . Not on file   Social History Narrative  . No narrative on file    FAMILY HISTORY: 1 brother& 1 sister- no cancer. Mom-no cancer Family History  Problem Relation Age of Onset  . Hypertension Mother   . Arthritis Father   . Prostate cancer Father   . Diabetes Maternal Grandmother   . Colon cancer Maternal Grandfather     colon    ALLERGIES:  has No Known Allergies.  MEDICATIONS:  Current Outpatient Prescriptions  Medication Sig Dispense Refill  . benazepril-hydrochlorthiazide (LOTENSIN HCT) 20-12.5 MG tablet Take 1 tablet by mouth daily.  60 tablet 3  . feeding supplement (BOOST HIGH PROTEIN) LIQD Take 1 Container by mouth daily.    . ferrous sulfate (FERROUSUL) 325 (65 FE) MG tablet Take 1 tablet (325 mg total) by mouth daily with breakfast. 90 tablet 3  . lidocaine-prilocaine (EMLA) cream Apply 1 application topically as needed. Apply generously over the Mediport 45 minutes prior to chemotherapy. 30 g 0  . Multiple Vitamins-Minerals (MULTIVITAMIN ADULT PO) Take 1 tablet by mouth daily.    . potassium chloride SA (K-DUR,KLOR-CON) 20 MEQ tablet Take 2 tablets (40 mEq total) by mouth 2 (two) times daily. (Patient  taking differently: Take 20 mEq by mouth daily. ) 60 tablet 3  . acetaminophen (TYLENOL) 325 MG tablet Take 2 tablets (650 mg total) by mouth every 6 (six) hours as needed for mild pain (or Fever >/= 101). (Patient not taking: Reported on 04/03/2016)    . gabapentin (NEURONTIN) 300 MG capsule Take 1 capsule (300 mg total) by mouth at bedtime. 90 capsule 3  . ondansetron (ZOFRAN) 8 MG tablet Take 1 tablet (8 mg total) by mouth every 8 (eight) hours as needed for nausea or vomiting (start 3 days; after chemo). (Patient not taking: Reported on 04/03/2016) 40 tablet 0  . prochlorperazine (COMPAZINE) 10 MG tablet Take 1 tablet (10 mg total) by mouth every 6 (six) hours as needed for nausea or vomiting. (Patient not taking: Reported on 04/03/2016) 30 tablet 0   No current facility-administered medications for this visit.    Facility-Administered Medications Ordered in Other Visits  Medication Dose Route Frequency Provider Last Rate Last Dose  . sodium chloride flush (NS) 0.9 % injection 10 mL  10 mL Intravenous PRN Cammie Sickle, MD   10 mL at 10/04/15 0901      .  PHYSICAL EXAMINATION: ECOG PERFORMANCE STATUS: 0 - Asymptomatic  Vitals:   04/03/16 0853  BP: 120/82  Pulse: 92  Resp: 20  Temp: 97.8 F (36.6 C)   Filed Weights   04/03/16 0853  Weight: 104 lb 6.4 oz (47.4 kg)    GENERAL: Well-nourished well-developed; thin built; Alert, no distress and comfortable.   Accompanied by her mother.  EYES: no pallor or icterus OROPHARYNX: no thrush or ulceration; good dentition  NECK: supple, no masses felt LYMPH:  no palpable lymphadenopathy in the cervical, axillary or inguinal regions LUNGS: clear to auscultation and  No wheeze or crackles HEART/CVS: regular rate & rhythm and no murmurs; No lower extremity edema ABDOMEN: abdomen soft, non-tender and normal bowel sounds; surgical incisions noted. No infections. Musculoskeletal:no cyanosis of digits and no clubbing  PSYCH: alert &  oriented x 3 with fluent speech NEURO: no focal motor/sensory deficits SKIN:  no rashes or significant lesions  LABORATORY DATA:  I have reviewed the data as listed Lab Results  Component Value Date   WBC 5.0 04/03/2016   HGB 14.2 04/03/2016   HCT 42.4 04/03/2016   MCV 93.8 04/03/2016   PLT 62 (L) 04/03/2016    Recent Labs  02/14/16 0837 03/13/16 0841 04/03/16 0824  NA 140 140 136  K 3.6 3.9 3.6  CL 113* 108 104  CO2 22 23 24   GLUCOSE 102* 124* 106*  BUN 15 18 20   CREATININE 0.66 0.91 0.82  CALCIUM 9.0 8.8* 9.0  GFRNONAA >60 >60 >60  GFRAA >60 >60 >60  PROT 6.8 7.0 7.3  ALBUMIN 3.8 3.8 4.1  AST 29 42* 23  ALT 41 36 19  ALKPHOS 124 115 103  BILITOT  0.4 0.5 0.3     ASSESSMENT & PLAN:   .Cancer of ascending colon metastatic to intra-abdominal lymph node ([place]) # Colon cancer- cecal/ right-sided; STAGE IV [intraabdominal/retroperitonel LN/Supracla; K-ras mutated]. Currently on 5FU palliative chemotherapy+ avastin.   # Clinically no evidence of progression tolerating treatments fairly well except- low platelets 60s;  proceed with 5FU+ Avastin today.   # Thrombocytopenia platelets 63- okay to proceed with treatments.   # HTN- improved today.  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 Discussed ortho stasis/ drowsiness.   #  Follow-up with me in 3 weeks with chemotherapy Avastin; UA; scan prior to next visit.     Cammie Sickle, MD 04/03/2016 6:11 PM

## 2016-04-03 NOTE — Assessment & Plan Note (Addendum)
#  Colon cancer- cecal/ right-sided; STAGE IV [intraabdominal/retroperitonel LN/Supracla; K-ras mutated]. Currently on 5FU palliative chemotherapy+ avastin.   # Clinically no evidence of progression tolerating treatments fairly well except- low platelets 60s;  proceed with 5FU+ Avastin today.   # Thrombocytopenia platelets 63- okay to proceed with treatments.   # HTN- improved today.  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 Discussed ortho stasis/ drowsiness.   #  Follow-up with me in 3 weeks with chemotherapy Avastin; UA; scan prior to next visit.

## 2016-04-04 LAB — CEA: CEA: 4 ng/mL (ref 0.0–4.7)

## 2016-04-05 ENCOUNTER — Inpatient Hospital Stay: Payer: BLUE CROSS/BLUE SHIELD

## 2016-04-05 VITALS — BP 133/84 | HR 90 | Temp 97.6°F | Resp 14

## 2016-04-05 DIAGNOSIS — C772 Secondary and unspecified malignant neoplasm of intra-abdominal lymph nodes: Principal | ICD-10-CM

## 2016-04-05 DIAGNOSIS — C182 Malignant neoplasm of ascending colon: Secondary | ICD-10-CM | POA: Diagnosis not present

## 2016-04-05 MED ORDER — HEPARIN SOD (PORK) LOCK FLUSH 100 UNIT/ML IV SOLN
500.0000 [IU] | Freq: Once | INTRAVENOUS | Status: AC | PRN
  Administered 2016-04-05: 500 [IU]
  Filled 2016-04-05: qty 5

## 2016-04-05 MED ORDER — SODIUM CHLORIDE 0.9% FLUSH
10.0000 mL | INTRAVENOUS | Status: DC | PRN
  Administered 2016-04-05: 10 mL
  Filled 2016-04-05: qty 10

## 2016-04-07 DIAGNOSIS — C182 Malignant neoplasm of ascending colon: Secondary | ICD-10-CM | POA: Diagnosis not present

## 2016-04-17 ENCOUNTER — Ambulatory Visit
Admission: RE | Admit: 2016-04-17 | Discharge: 2016-04-17 | Disposition: A | Discharge status: Home / Self Care | Payer: 59 | Source: Ambulatory Visit | Attending: Internal Medicine | Admitting: Internal Medicine

## 2016-04-17 DIAGNOSIS — C772 Secondary and unspecified malignant neoplasm of intra-abdominal lymph nodes: Secondary | ICD-10-CM

## 2016-04-17 DIAGNOSIS — C189 Malignant neoplasm of colon, unspecified: Secondary | ICD-10-CM | POA: Diagnosis not present

## 2016-04-17 DIAGNOSIS — M47894 Other spondylosis, thoracic region: Secondary | ICD-10-CM | POA: Insufficient documentation

## 2016-04-17 DIAGNOSIS — I700 Atherosclerosis of aorta: Secondary | ICD-10-CM | POA: Diagnosis not present

## 2016-04-17 DIAGNOSIS — R918 Other nonspecific abnormal finding of lung field: Secondary | ICD-10-CM | POA: Insufficient documentation

## 2016-04-17 DIAGNOSIS — M5136 Other intervertebral disc degeneration, lumbar region: Secondary | ICD-10-CM | POA: Insufficient documentation

## 2016-04-17 DIAGNOSIS — C182 Malignant neoplasm of ascending colon: Secondary | ICD-10-CM

## 2016-04-17 HISTORY — DX: Essential (primary) hypertension: I10

## 2016-04-17 MED ORDER — IOPAMIDOL (ISOVUE-300) INJECTION 61%: 75.0000 mL | Freq: Once | INTRAVENOUS | Status: DC | PRN

## 2016-04-17 MED ORDER — IOPAMIDOL (ISOVUE-300) INJECTION 61%
75.0000 mL | Freq: Once | INTRAVENOUS | Status: AC | PRN
  Administered 2016-04-17: 75 mL via INTRAVENOUS

## 2016-04-24 ENCOUNTER — Inpatient Hospital Stay: Payer: 59 | Attending: Internal Medicine

## 2016-04-24 ENCOUNTER — Inpatient Hospital Stay (HOSPITAL_BASED_OUTPATIENT_CLINIC_OR_DEPARTMENT_OTHER): Payer: 59 | Admitting: Internal Medicine

## 2016-04-24 ENCOUNTER — Encounter: Payer: Self-pay | Admitting: Internal Medicine

## 2016-04-24 ENCOUNTER — Inpatient Hospital Stay: Payer: 59

## 2016-04-24 VITALS — BP 115/76 | HR 90 | Temp 98.5°F | Ht 60.0 in | Wt 106.4 lb

## 2016-04-24 DIAGNOSIS — D696 Thrombocytopenia, unspecified: Secondary | ICD-10-CM

## 2016-04-24 DIAGNOSIS — Z9049 Acquired absence of other specified parts of digestive tract: Secondary | ICD-10-CM

## 2016-04-24 DIAGNOSIS — Z5112 Encounter for antineoplastic immunotherapy: Secondary | ICD-10-CM | POA: Diagnosis not present

## 2016-04-24 DIAGNOSIS — C772 Secondary and unspecified malignant neoplasm of intra-abdominal lymph nodes: Secondary | ICD-10-CM

## 2016-04-24 DIAGNOSIS — Z79899 Other long term (current) drug therapy: Secondary | ICD-10-CM | POA: Insufficient documentation

## 2016-04-24 DIAGNOSIS — Z87891 Personal history of nicotine dependence: Secondary | ICD-10-CM | POA: Diagnosis not present

## 2016-04-24 DIAGNOSIS — G629 Polyneuropathy, unspecified: Secondary | ICD-10-CM | POA: Diagnosis not present

## 2016-04-24 DIAGNOSIS — I10 Essential (primary) hypertension: Secondary | ICD-10-CM | POA: Insufficient documentation

## 2016-04-24 DIAGNOSIS — C182 Malignant neoplasm of ascending colon: Secondary | ICD-10-CM

## 2016-04-24 DIAGNOSIS — Z800 Family history of malignant neoplasm of digestive organs: Secondary | ICD-10-CM

## 2016-04-24 DIAGNOSIS — R918 Other nonspecific abnormal finding of lung field: Secondary | ICD-10-CM | POA: Insufficient documentation

## 2016-04-24 LAB — COMPREHENSIVE METABOLIC PANEL
ALT: 22 U/L (ref 14–54)
ANION GAP: 9 (ref 5–15)
AST: 26 U/L (ref 15–41)
Albumin: 3.5 g/dL (ref 3.5–5.0)
Alkaline Phosphatase: 99 U/L (ref 38–126)
BILIRUBIN TOTAL: 0.4 mg/dL (ref 0.3–1.2)
BUN: 18 mg/dL (ref 6–20)
CHLORIDE: 105 mmol/L (ref 101–111)
CO2: 24 mmol/L (ref 22–32)
Calcium: 9 mg/dL (ref 8.9–10.3)
Creatinine, Ser: 0.84 mg/dL (ref 0.44–1.00)
Glucose, Bld: 104 mg/dL — ABNORMAL HIGH (ref 65–99)
Potassium: 3.6 mmol/L (ref 3.5–5.1)
Sodium: 138 mmol/L (ref 135–145)
TOTAL PROTEIN: 6.8 g/dL (ref 6.5–8.1)

## 2016-04-24 LAB — CBC WITH DIFFERENTIAL/PLATELET
BASOS PCT: 1 %
Basophils Absolute: 0.1 10*3/uL (ref 0–0.1)
EOS PCT: 2 %
Eosinophils Absolute: 0.1 10*3/uL (ref 0–0.7)
HEMATOCRIT: 39.5 % (ref 35.0–47.0)
Hemoglobin: 13.2 g/dL (ref 12.0–16.0)
LYMPHS PCT: 29 %
Lymphs Abs: 1.6 10*3/uL (ref 1.0–3.6)
MCH: 31 pg (ref 26.0–34.0)
MCHC: 33.4 g/dL (ref 32.0–36.0)
MCV: 92.9 fL (ref 80.0–100.0)
MONO ABS: 0.7 10*3/uL (ref 0.2–0.9)
MONOS PCT: 12 %
Neutro Abs: 3.1 10*3/uL (ref 1.4–6.5)
Neutrophils Relative %: 56 %
PLATELETS: 89 10*3/uL — AB (ref 150–440)
RBC: 4.25 MIL/uL (ref 3.80–5.20)
RDW: 15.8 % — AB (ref 11.5–14.5)
WBC: 5.6 10*3/uL (ref 3.6–11.0)

## 2016-04-24 LAB — URINALYSIS COMPLETE WITH MICROSCOPIC (ARMC ONLY)
BILIRUBIN URINE: NEGATIVE
Glucose, UA: NEGATIVE mg/dL
HGB URINE DIPSTICK: NEGATIVE
KETONES UR: NEGATIVE mg/dL
NITRITE: NEGATIVE
PH: 5 (ref 5.0–8.0)
Protein, ur: NEGATIVE mg/dL
SPECIFIC GRAVITY, URINE: 1.015 (ref 1.005–1.030)

## 2016-04-24 MED ORDER — SODIUM CHLORIDE 0.9 % IJ SOLN
10.0000 mL | Freq: Once | INTRAMUSCULAR | Status: AC
  Administered 2016-04-24: 10 mL via INTRAVENOUS
  Filled 2016-04-24: qty 10

## 2016-04-24 MED ORDER — SODIUM CHLORIDE 0.9 % IV SOLN
2400.0000 mg/m2 | INTRAVENOUS | Status: DC
  Administered 2016-04-24: 3350 mg via INTRAVENOUS
  Filled 2016-04-24: qty 67

## 2016-04-24 MED ORDER — LEUCOVORIN CALCIUM INJECTION 100 MG
20.0000 mg/m2 | Freq: Once | INTRAMUSCULAR | Status: AC
  Administered 2016-04-24: 28 mg via INTRAVENOUS
  Filled 2016-04-24: qty 1.4

## 2016-04-24 MED ORDER — HEPARIN SOD (PORK) LOCK FLUSH 100 UNIT/ML IV SOLN: 500.0000 [IU] | Freq: Once | INTRAVENOUS | Status: DC

## 2016-04-24 MED ORDER — SODIUM CHLORIDE 0.9 % IV SOLN: Freq: Once | INTRAVENOUS | Status: DC

## 2016-04-24 MED ORDER — LEUCOVORIN CALCIUM INJECTION 350 MG: 400.0000 mg/m2 | Freq: Once | INTRAMUSCULAR | Status: DC

## 2016-04-24 MED ORDER — DEXTROSE 5 % IV SOLN
Freq: Once | INTRAVENOUS | Status: DC
  Filled 2016-04-24: qty 1000

## 2016-04-24 MED ORDER — SODIUM CHLORIDE 0.9 % IV SOLN
Freq: Once | INTRAVENOUS | Status: AC
  Administered 2016-04-24: 11:00:00 via INTRAVENOUS
  Filled 2016-04-24: qty 1000

## 2016-04-24 MED ORDER — BEVACIZUMAB CHEMO INJECTION 400 MG/16ML
5.0000 mg/kg | Freq: Once | INTRAVENOUS | Status: AC
  Administered 2016-04-24: 225 mg via INTRAVENOUS
  Filled 2016-04-24: qty 9

## 2016-04-24 MED ORDER — ONDANSETRON HCL 4 MG PO TABS
8.0000 mg | ORAL_TABLET | Freq: Once | ORAL | Status: AC
  Administered 2016-04-24: 8 mg via ORAL
  Filled 2016-04-24: qty 2

## 2016-04-24 MED ORDER — DEXAMETHASONE SODIUM PHOSPHATE 10 MG/ML IJ SOLN
10.0000 mg | Freq: Once | INTRAMUSCULAR | Status: AC
  Administered 2016-04-24: 10 mg via INTRAVENOUS
  Filled 2016-04-24: qty 1

## 2016-04-24 NOTE — Progress Notes (Signed)
Platelets 89,000 today. Per Dr Rogue Bussing proceed with treatment today

## 2016-04-24 NOTE — Progress Notes (Signed)
Patient her for pre treatment check, No signifcant changes since last appt.

## 2016-04-24 NOTE — Assessment & Plan Note (Addendum)
#  Colon cancer- cecal/ right-sided; STAGE IV [intraabdominal/retroperitonel LN/Supracla; K-ras mutated]. Currently on 5FU palliative chemotherapy+ avastin. NOV 27th CT- Improved/CR except for LUL 4 mm lung nodules.   # Clinically no evidence of progression tolerating treatments fairly well except- low platelets 89;  proceed with 5FU+ Avastin today.   # Thrombocytopenia platelets 89- okay to proceed with treatments.   # HTN- improved today.  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     # pt can go back to work if interested/if she can do it.   #  Follow-up with me in on Jan 8th  with chemotherapy Avastin; UA;CEA.   # I reviewed the blood work- with the patient in detail; also reviewed the imaging independently [as summarized above]; and with the patient in detail.

## 2016-04-24 NOTE — Progress Notes (Signed)
Nancy Clark  Patient Care Team: Coral Spikes, DO as PCP - General (Family Medicine)  CHIEF COMPLAINTS/PURPOSE OF CONSULTATION:   Oncology History   # [facility] 2017- COLON CANCER- STAGE IV [s/p right hemi-colectomy; pT4apN2 (7/19LN)]; Pre-op CEA- [id number]  PET-  mesenteric adenopathy/mediastinal adenopathy/right-sided subclavicular lymph node.    April 17th -START FOLFOX; May 1st Add Avastin;   Aug 16th- Disc OX [sec PN]; con 5FU-Avastin; NOV 27th CT C/A/P- CR; except for 53m LUL; 5FU-Avastin q 3 W   # MOLECULAR TESTING: K-ras-EXON 2- MUTATED; MSI- STABLE.      Cancer of ascending colon metastatic to intra-abdominal lymph node ([place]   09/06/2015 Initial Diagnosis    Cancer of ascending colon metastatic to intra-abdominal lymph node (HCC)        HISTORY OF PRESENTING ILLNESS:  [name]58y.o.  female  with a diagnosed Metastatic right-sided colon cancer status post 5-FU plus Avastin; Is here for follow-up/To review the results of the CT scan.  Patient notes to have intermittent tingling and numbness in her hand and feet-mild improvement noted however not resolved. Patient continues to Neurontin 300 mg at nighttime.. Denies any headaches or nosebleeds. Patient's appetite is good. She is gained some weight. No nausea no vomiting. No bleeding. No chest pain or shortness of breath or cough. Denies any diarrhea.  ROS: A complete 10 point review of system is done which is negative except mentioned above in history of present illness  MEDICAL HISTORY:  Past Medical History:  Diagnosis Date  . Chicken pox   . Colon cancer ([facility]   . Hypertension     SURGICAL HISTORY: Past Surgical History:  Procedure Laterality Date  . BREAST BIOPSY Right    2007? pt unsure done by [facility]  . COLONOSCOPY    . COLONOSCOPY WITH PROPOFOL N/A 10/14/2015   Procedure: COLONOSCOPY WITH PROPOFOL;  Surgeon: PHulen Luster MD;  [facility];  Service: Endoscopy;   Laterality: N/A;  . COLOSTOMY REVISION Right 08/09/2015   Procedure: COLON RESECTION RIGHT;  Surgeon: RFlorene Glen MD;  Location: [facility] ORS;  Service: General;  Laterality: Right;  . ECTOPIC PREGNANCY SURGERY    . PORTACATH PLACEMENT N/A 09/01/2015   Procedure: INSERTION PORT-A-CATH;  Surgeon: RFlorene Glen MD;  Location: [facility] ORS;  Service: General;  Laterality: N/A;  . SHOULDER SURGERY Left     SOCIAL H[place]  1 [place] in 3 days; no alcohol. 2 childern [25 & 17 years] Social History   Social History  . Marital status: Married    Spouse name: N/A  . Number of children: N/A  . Years of education: N/A   Occupational History  . Not on file.   Social History Main Topics  . Smoking status: Former Smoker    Packs/day: 0.25    Years: 2.00  . Smokeless tobacco: Never Used     Comment: recently quit  . Alcohol use No  . Drug use: No  . Sexual activity: Yes   Other Topics Concern  . Not on file   Social History Narrative  . No narrative on file    FAMILY HISTORY: 1 brother& 1 sister- no cancer. Mom-no cancer Family History  Problem Relation Age of Onset  . Hypertension Mother   . Arthritis Father   . Prostate cancer Father   . Diabetes Maternal Grandmother   . Colon cancer Maternal Grandfather     colon  ALLERGIES:  has No Known Allergies.  MEDICATIONS:  Current Outpatient Prescriptions  Medication Sig Dispense Refill  . acetaminophen (TYLENOL) 325 MG tablet Take 2 tablets (650 mg total) by mouth every 6 (six) hours as needed for mild pain (or Fever >/= 101).    . benazepril-hydrochlorthiazide (LOTENSIN HCT) 20-12.5 MG tablet Take 1 tablet by mouth daily. 60 tablet 3  . feeding supplement (BOOST HIGH PROTEIN) LIQD Take 1 Container by mouth daily.    . ferrous sulfate (FERROUSUL) 325 (65 FE) MG tablet Take 1 tablet (325 mg total) by mouth daily with breakfast. 90 tablet 3  . gabapentin (NEURONTIN) 300 MG capsule Take 1  capsule (300 mg total) by mouth at bedtime. 90 capsule 3  . lidocaine-prilocaine (EMLA) cream Apply 1 application topically as needed. Apply generously over the Mediport 45 minutes prior to chemotherapy. 30 g 0  . Multiple Vitamins-Minerals (MULTIVITAMIN ADULT PO) Take 1 tablet by mouth daily.    . potassium chloride SA (K-DUR,KLOR-CON) 20 MEQ tablet Take 2 tablets (40 mEq total) by mouth 2 (two) times daily. (Patient taking differently: Take 20 mEq by mouth daily. ) 60 tablet 3  . ondansetron (ZOFRAN) 8 MG tablet Take 1 tablet (8 mg total) by mouth every 8 (eight) hours as needed for nausea or vomiting (start 3 days; after chemo). (Patient not taking: Reported on 04/24/2016) 40 tablet 0  . prochlorperazine (COMPAZINE) 10 MG tablet Take 1 tablet (10 mg total) by mouth every 6 (six) hours as needed for nausea or vomiting. (Patient not taking: Reported on 04/24/2016) 30 tablet 0   No current facility-administered medications for this visit.    Facility-Administered Medications Ordered in Other Visits  Medication Dose Route Frequency Provider Last Rate Last Dose  . sodium chloride flush (NS) 0.9 % injection 10 mL  10 mL Intravenous PRN Cammie Sickle, MD   10 mL at 10/04/15 0901      .  PHYSICAL EXAMINATION: ECOG PERFORMANCE STATUS: 0 - Asymptomatic  Vitals:   04/24/16 0923  BP: 115/76  Pulse: 90  Temp: 98.5 F (36.9 C)   Filed Weights   04/24/16 0923  Weight: 106 lb 6.4 oz (48.3 kg)    GENERAL: Well-nourished well-developed; thin built; Alert, no distress and comfortable.   Accompanied by her mother.  EYES: no pallor or icterus OROPHARYNX: no thrush or ulceration; good dentition  NECK: supple, no masses felt LYMPH:  no palpable lymphadenopathy in the cervical, axillary or inguinal regions LUNGS: clear to auscultation and  No wheeze or crackles HEART/CVS: regular rate & rhythm and no murmurs; No lower extremity edema ABDOMEN: abdomen soft, non-tender and normal bowel sounds;  surgical incisions noted. No infections. Musculoskeletal:no cyanosis of digits and no clubbing  PSYCH: alert & oriented x 3 with fluent speech NEURO: no focal motor/sensory deficits SKIN:  no rashes or significant lesions  LABORATORY DATA:  I have reviewed the data as listed Lab Results  Component Value Date   WBC 5.6 04/24/2016   HGB 13.2 04/24/2016   HCT 39.5 04/24/2016   MCV 92.9 04/24/2016   PLT 89 (L) 04/24/2016    Recent Labs  03/13/16 0841 04/03/16 0824 04/24/16 0842  NA 140 136 138  K 3.9 3.6 3.6  CL 108 104 105  CO2 23 24 24   GLUCOSE 124* 106* 104*  BUN 18 20 18   CREATININE 0.91 0.82 0.84  CALCIUM 8.8* 9.0 9.0  GFRNONAA >60 >60 >60  GFRAA >60 >60 >60  PROT 7.0 7.3 6.8  ALBUMIN 3.8 4.1 3.5  AST 42* 23 26  ALT 36 19 22  ALKPHOS 115 103 99  BILITOT 0.5 0.3 0.4     ASSESSMENT & PLAN:   .Cancer of ascending colon metastatic to intra-abdominal lymph node (HCC) # Colon cancer- cecal/ right-sided; STAGE IV [intraabdominal/retroperitonel LN/Supracla; K-ras mutated]. Currently on 5FU palliative chemotherapy+ avastin. NOV 27th CT- Improved/CR except for LUL 4 mm lung nodules.   # Clinically no evidence of progression tolerating treatments fairly well except- low platelets 89;  proceed with 5FU+ Avastin today.   # Thrombocytopenia platelets 89- okay to proceed with treatments.   # HTN- improved today.  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     # pt can go back to work if interested/if she can do it.   #  Follow-up with me in on Jan 8th  with chemotherapy Avastin; UA;CEA.   # I reviewed the blood work- with the patient in detail; also reviewed the imaging independently [as summarized above]; and with the patient in detail.     Cammie Sickle, MD 04/25/2016 7:53 AM

## 2016-04-26 ENCOUNTER — Inpatient Hospital Stay: Payer: 59

## 2016-04-26 VITALS — BP 126/84 | HR 80 | Temp 97.3°F

## 2016-04-26 DIAGNOSIS — Z9049 Acquired absence of other specified parts of digestive tract: Secondary | ICD-10-CM | POA: Diagnosis not present

## 2016-04-26 DIAGNOSIS — R918 Other nonspecific abnormal finding of lung field: Secondary | ICD-10-CM | POA: Diagnosis not present

## 2016-04-26 DIAGNOSIS — D696 Thrombocytopenia, unspecified: Secondary | ICD-10-CM | POA: Diagnosis not present

## 2016-04-26 DIAGNOSIS — G629 Polyneuropathy, unspecified: Secondary | ICD-10-CM | POA: Diagnosis not present

## 2016-04-26 DIAGNOSIS — I10 Essential (primary) hypertension: Secondary | ICD-10-CM | POA: Diagnosis not present

## 2016-04-26 DIAGNOSIS — Z5112 Encounter for antineoplastic immunotherapy: Secondary | ICD-10-CM | POA: Diagnosis not present

## 2016-04-26 DIAGNOSIS — C772 Secondary and unspecified malignant neoplasm of intra-abdominal lymph nodes: Principal | ICD-10-CM

## 2016-04-26 DIAGNOSIS — C182 Malignant neoplasm of ascending colon: Secondary | ICD-10-CM | POA: Diagnosis not present

## 2016-04-26 DIAGNOSIS — Z79899 Other long term (current) drug therapy: Secondary | ICD-10-CM | POA: Diagnosis not present

## 2016-04-26 MED ORDER — HEPARIN SOD (PORK) LOCK FLUSH 100 UNIT/ML IV SOLN
500.0000 [IU] | Freq: Once | INTRAVENOUS | Status: AC | PRN
Start: 2016-04-26 — End: 2016-04-26
  Administered 2016-04-26: 500 [IU]

## 2016-04-26 MED ORDER — SODIUM CHLORIDE 0.9% FLUSH
10.0000 mL | INTRAVENOUS | Status: DC | PRN
  Administered 2016-04-26: 10 mL
  Filled 2016-04-26: qty 10

## 2016-04-26 MED ORDER — HEPARIN SOD (PORK) LOCK FLUSH 100 UNIT/ML IV SOLN
INTRAVENOUS | Status: AC
  Filled 2016-04-26: qty 5

## 2016-04-28 ENCOUNTER — Telehealth: Payer: Self-pay | Admitting: *Deleted

## 2016-04-28 NOTE — Telephone Encounter (Signed)
Returned call to [employer], spoke with Belenda Cruise.  Belenda Cruise states that pt needs "scailing and root planting".  Westley Hummer that patient can NOT have this done due to the medication she is currently on, Atavastin.

## 2016-04-28 NOTE — Telephone Encounter (Signed)
Asking for consent to treat her since she is on chemo. Please advise

## 2016-05-07 DIAGNOSIS — C182 Malignant neoplasm of ascending colon: Secondary | ICD-10-CM | POA: Diagnosis not present

## 2016-05-29 ENCOUNTER — Inpatient Hospital Stay: Payer: BLUE CROSS/BLUE SHIELD

## 2016-05-29 ENCOUNTER — Encounter (INDEPENDENT_AMBULATORY_CARE_PROVIDER_SITE_OTHER): Payer: Self-pay

## 2016-05-29 ENCOUNTER — Inpatient Hospital Stay: Payer: BLUE CROSS/BLUE SHIELD | Admitting: *Deleted

## 2016-05-29 ENCOUNTER — Inpatient Hospital Stay: Payer: BLUE CROSS/BLUE SHIELD | Attending: Internal Medicine | Admitting: Internal Medicine

## 2016-05-29 VITALS — BP 98/64 | HR 83 | Temp 95.7°F | Wt 106.5 lb

## 2016-05-29 DIAGNOSIS — D696 Thrombocytopenia, unspecified: Secondary | ICD-10-CM

## 2016-05-29 DIAGNOSIS — Z87891 Personal history of nicotine dependence: Secondary | ICD-10-CM | POA: Diagnosis not present

## 2016-05-29 DIAGNOSIS — Z800 Family history of malignant neoplasm of digestive organs: Secondary | ICD-10-CM | POA: Insufficient documentation

## 2016-05-29 DIAGNOSIS — C785 Secondary malignant neoplasm of large intestine and rectum: Secondary | ICD-10-CM

## 2016-05-29 DIAGNOSIS — C772 Secondary and unspecified malignant neoplasm of intra-abdominal lymph nodes: Secondary | ICD-10-CM | POA: Insufficient documentation

## 2016-05-29 DIAGNOSIS — C182 Malignant neoplasm of ascending colon: Secondary | ICD-10-CM | POA: Insufficient documentation

## 2016-05-29 DIAGNOSIS — Z79899 Other long term (current) drug therapy: Secondary | ICD-10-CM | POA: Diagnosis not present

## 2016-05-29 DIAGNOSIS — R918 Other nonspecific abnormal finding of lung field: Secondary | ICD-10-CM | POA: Diagnosis not present

## 2016-05-29 DIAGNOSIS — Z5112 Encounter for antineoplastic immunotherapy: Secondary | ICD-10-CM | POA: Diagnosis not present

## 2016-05-29 DIAGNOSIS — I10 Essential (primary) hypertension: Secondary | ICD-10-CM | POA: Diagnosis not present

## 2016-05-29 DIAGNOSIS — G629 Polyneuropathy, unspecified: Secondary | ICD-10-CM | POA: Insufficient documentation

## 2016-05-29 DIAGNOSIS — Z8042 Family history of malignant neoplasm of prostate: Secondary | ICD-10-CM | POA: Insufficient documentation

## 2016-05-29 LAB — COMPREHENSIVE METABOLIC PANEL
ALBUMIN: 3.8 g/dL (ref 3.5–5.0)
ALK PHOS: 74 U/L (ref 38–126)
ALT: 13 U/L — ABNORMAL LOW (ref 14–54)
ANION GAP: 6 (ref 5–15)
AST: 18 U/L (ref 15–41)
BUN: 20 mg/dL (ref 6–20)
CHLORIDE: 107 mmol/L (ref 101–111)
CO2: 24 mmol/L (ref 22–32)
Calcium: 9 mg/dL (ref 8.9–10.3)
Creatinine, Ser: 0.79 mg/dL (ref 0.44–1.00)
GFR calc Af Amer: >60 mL/min (ref >60)
GFR calc non Af Amer: >60 mL/min (ref >60)
GLUCOSE: 92 mg/dL (ref 65–99)
POTASSIUM: 3.7 mmol/L (ref 3.5–5.1)
SODIUM: 137 mmol/L (ref 135–145)
Total Bilirubin: 0.5 mg/dL (ref 0.3–1.2)
Total Protein: 7.1 g/dL (ref 6.5–8.1)

## 2016-05-29 LAB — URINALYSIS, COMPLETE (UACMP) WITH MICROSCOPIC
BACTERIA UA: NONE SEEN
BILIRUBIN URINE: NEGATIVE
Glucose, UA: NEGATIVE mg/dL
Hgb urine dipstick: NEGATIVE
KETONES UR: NEGATIVE mg/dL
Nitrite: NEGATIVE
PROTEIN: NEGATIVE mg/dL
SPECIFIC GRAVITY, URINE: 1.014 (ref 1.005–1.030)
pH: 5 (ref 5.0–8.0)

## 2016-05-29 LAB — CBC WITH DIFFERENTIAL/PLATELET
Basophils Absolute: 0.1 10*3/uL (ref 0–0.1)
Basophils Relative: 1 %
EOS ABS: 0.1 10*3/uL (ref 0–0.7)
Eosinophils Relative: 1 %
HCT: 39.6 % (ref 35.0–47.0)
HEMOGLOBIN: 13.2 g/dL (ref 12.0–16.0)
Lymphocytes Relative: 24 %
Lymphs Abs: 1.8 10*3/uL (ref 1.0–3.6)
MCH: 30.1 pg (ref 26.0–34.0)
MCHC: 33.2 g/dL (ref 32.0–36.0)
MCV: 90.7 fL (ref 80.0–100.0)
MONOS PCT: 10 %
Monocytes Absolute: 0.7 10*3/uL (ref 0.2–0.9)
NEUTROS PCT: 64 %
Neutro Abs: 4.8 10*3/uL (ref 1.4–6.5)
Platelets: 107 10*3/uL — ABNORMAL LOW (ref 150–440)
RBC: 4.37 MIL/uL (ref 3.80–5.20)
RDW: 16.6 % — AB (ref 11.5–14.5)
WBC: 7.5 10*3/uL (ref 3.6–11.0)

## 2016-05-29 MED ORDER — SODIUM CHLORIDE 0.9 % IV SOLN
Freq: Once | INTRAVENOUS | Status: AC
  Administered 2016-05-29: 11:00:00 via INTRAVENOUS
  Filled 2016-05-29: qty 1000

## 2016-05-29 MED ORDER — LEUCOVORIN CALCIUM INJECTION 350 MG: 400.0000 mg/m2 | Freq: Once | INTRAVENOUS | Status: DC

## 2016-05-29 MED ORDER — SODIUM CHLORIDE 0.9 % IV SOLN
5.0000 mg/kg | Freq: Once | INTRAVENOUS | Status: AC
  Administered 2016-05-29: 225 mg via INTRAVENOUS
  Filled 2016-05-29: qty 9

## 2016-05-29 MED ORDER — DEXAMETHASONE SODIUM PHOSPHATE 10 MG/ML IJ SOLN
10.0000 mg | Freq: Once | INTRAMUSCULAR | Status: AC
  Administered 2016-05-29: 10 mg via INTRAVENOUS
  Filled 2016-05-29: qty 1
  Filled 2016-05-29: qty 3

## 2016-05-29 MED ORDER — SODIUM CHLORIDE 0.9 % IV SOLN: Freq: Once | INTRAVENOUS | Status: DC

## 2016-05-29 MED ORDER — SODIUM CHLORIDE 0.9 % IV SOLN
2400.0000 mg/m2 | INTRAVENOUS | Status: DC
  Administered 2016-05-29: 3350 mg via INTRAVENOUS
  Filled 2016-05-29: qty 67

## 2016-05-29 MED ORDER — LEUCOVORIN CALCIUM INJECTION 100 MG
20.0000 mg/m2 | Freq: Once | INTRAMUSCULAR | Status: AC
  Administered 2016-05-29: 28 mg via INTRAVENOUS
  Filled 2016-05-29: qty 1.4

## 2016-05-29 MED ORDER — ONDANSETRON 8 MG PO TBDP
8.0000 mg | ORAL_TABLET | Freq: Once | ORAL | Status: AC
  Administered 2016-05-29: 8 mg via ORAL
  Filled 2016-05-29: qty 1

## 2016-05-29 MED ORDER — HEPARIN SOD (PORK) LOCK FLUSH 100 UNIT/ML IV SOLN
500.0000 [IU] | Freq: Once | INTRAVENOUS | Status: DC | PRN
  Filled 2016-05-29: qty 5

## 2016-05-29 NOTE — Progress Notes (Signed)
[facility] CONSULT NOTE  Patient Care Team: Coral Spikes, DO as PCP - General (Family Medicine)  CHIEF COMPLAINTS/PURPOSE OF CONSULTATION:   Oncology History   # [facility] 2017- COLON CANCER- STAGE IV [s/p right hemi-colectomy; pT4apN2 (7/19LN)]; Pre-op CEA- [id number]-  PET-  mesenteric adenopathy/mediastinal adenopathy/right-sided subclavicular lymph node.    April 17th -START FOLFOX; May 1st Add Avastin;   Aug 16th- Disc OX [sec PN]; con 5FU-Avastin; NOV 27th CT C/A/P- CR; except for 64m LUL; 5FU-Avastin q 3 W   # MOLECULAR TESTING: K-ras-EXON 2- MUTATED; MSI- STABLE.      Cancer of ascending colon metastatic to intra-abdominal lymph node ([place]   09/06/2015 Initial Diagnosis    Cancer of ascending colon metastatic to intra-abdominal lymph node (HCC)        HISTORY OF PRESENTING ILLNESS:  Nancy VELEY89y.o.  female  with a diagnosed Metastatic right-sided colon cancer status post 5-FU plus Avastin; Is here for follow-up.   Patient's appetite is good. She is gained some weight. No nausea no vomiting. No bleeding. No chest pain or shortness of breath or cough. Denies any diarrhea. Patient notes to have intermittent tingling and numbness in her hand and feet- not resolved. Patient continues to Neurontin 300 mg at nighttime. Denies any headaches or nosebleeds.  ROS: A complete 10 point review of system is done which is negative except mentioned above in history of present illness  MEDICAL HISTORY:  Past Medical History:  Diagnosis Date  . Chicken pox   . Colon cancer ([place]   . Hypertension     SURGICAL HISTORY: Past Surgical History:  Procedure Laterality Date  . BREAST BIOPSY Right    2007? pt unsure done by [facility]  . COLONOSCOPY    . COLONOSCOPY WITH PROPOFOL N/A 10/14/2015   Procedure: COLONOSCOPY WITH PROPOFOL;  Surgeon: PHulen Luster MD;  [facility];  Service: Endoscopy;  Laterality: N/A;  . COLOSTOMY REVISION Right 08/09/2015    Procedure: COLON RESECTION RIGHT;  Surgeon: RFlorene Glen MD;  Location: [facility] ORS;  Service: General;  Laterality: Right;  . ECTOPIC PREGNANCY SURGERY    . PORTACATH PLACEMENT N/A 09/01/2015   Procedure: INSERTION PORT-A-CATH;  Surgeon: RFlorene Glen MD;  Location: [facility] ORS;  Service: General;  Laterality: N/A;  . SHOULDER SURGERY Left     SOCIAL H[place]  1 [place] in 3 days; no alcohol. 2 childern [25 & 17 years] Social History   Social History  . Marital status: Married    Spouse name: N/A  . Number of children: N/A  . Years of education: N/A   Occupational History  . Not on file.   Social History Main Topics  . Smoking status: Former Smoker    Packs/day: 0.25    Years: 2.00  . Smokeless tobacco: Never Used     Comment: recently quit  . Alcohol use No  . Drug use: No  . Sexual activity: Yes   Other Topics Concern  . Not on file   Social History Narrative  . No narrative on file    FAMILY HISTORY: 1 brother& 1 sister- no cancer. Mom-no cancer Family History  Problem Relation Age of Onset  . Hypertension Mother   . Arthritis Father   . Prostate cancer Father   . Diabetes Maternal Grandmother   . Colon cancer Maternal Grandfather     colon    ALLERGIES:  has No Known Allergies.  MEDICATIONS:  Current Outpatient Prescriptions  Medication Sig Dispense Refill  . acetaminophen (TYLENOL) 325 MG tablet Take 2 tablets (650 mg total) by mouth every 6 (six) hours as needed for mild pain (or Fever >/= 101).    . benazepril-hydrochlorthiazide (LOTENSIN HCT) 20-12.5 MG tablet Take 1 tablet by mouth daily. 60 tablet 3  . feeding supplement (BOOST HIGH PROTEIN) LIQD Take 1 Container by mouth daily.    . ferrous sulfate (FERROUSUL) 325 (65 FE) MG tablet Take 1 tablet (325 mg total) by mouth daily with breakfast. 90 tablet 3  . gabapentin (NEURONTIN) 300 MG capsule Take 1 capsule (300 mg total) by mouth at bedtime. 90 capsule 3  .  lidocaine-prilocaine (EMLA) cream Apply 1 application topically as needed. Apply generously over the Mediport 45 minutes prior to chemotherapy. 30 g 0  . Multiple Vitamins-Minerals (MULTIVITAMIN ADULT PO) Take 1 tablet by mouth daily.    . ondansetron (ZOFRAN) 8 MG tablet Take 1 tablet (8 mg total) by mouth every 8 (eight) hours as needed for nausea or vomiting (start 3 days; after chemo). 40 tablet 0  . potassium chloride SA (K-DUR,KLOR-CON) 20 MEQ tablet Take 2 tablets (40 mEq total) by mouth 2 (two) times daily. (Patient taking differently: Take 20 mEq by mouth daily. ) 60 tablet 3  . prochlorperazine (COMPAZINE) 10 MG tablet Take 1 tablet (10 mg total) by mouth every 6 (six) hours as needed for nausea or vomiting. 30 tablet 0   No current facility-administered medications for this visit.    Facility-Administered Medications Ordered in Other Visits  Medication Dose Route Frequency Provider Last Rate Last Dose  . sodium chloride flush (NS) 0.9 % injection 10 mL  10 mL Intravenous PRN Cammie Sickle, MD   10 mL at 10/04/15 0901      .  PHYSICAL EXAMINATION: ECOG PERFORMANCE STATUS: 0 - Asymptomatic  Vitals:   05/29/16 0953  BP: 98/64  Pulse: 83  Temp: (!) 95.7 F (35.4 C)   Filed Weights   05/29/16 0953  Weight: 106 lb 8 oz (48.3 kg)    GENERAL: Well-nourished well-developed; thin built; Alert, no distress and comfortable.   Accompanied by her mother.  EYES: no pallor or icterus OROPHARYNX: no thrush or ulceration; good dentition  NECK: supple, no masses felt LYMPH:  no palpable lymphadenopathy in the cervical, axillary or inguinal regions LUNGS: clear to auscultation and  No wheeze or crackles HEART/CVS: regular rate & rhythm and no murmurs; No lower extremity edema ABDOMEN: abdomen soft, non-tender and normal bowel sounds; surgical incisions noted. No infections. Musculoskeletal:no cyanosis of digits and no clubbing  PSYCH: alert & oriented x 3 with fluent  speech NEURO: no focal motor/sensory deficits SKIN:  no rashes or significant lesions  LABORATORY DATA:  I have reviewed the data as listed Lab Results  Component Value Date   WBC 7.5 05/29/2016   HGB 13.2 05/29/2016   HCT 39.6 05/29/2016   MCV 90.7 05/29/2016   PLT 107 (L) 05/29/2016    Recent Labs  04/03/16 0824 04/24/16 0842 05/29/16 0903  NA 136 138 137  K 3.6 3.6 3.7  CL 104 105 107  CO2 24 24 24   GLUCOSE 106* 104* 92  BUN 20 18 20   CREATININE 0.82 0.84 0.79  CALCIUM 9.0 9.0 9.0  GFRNONAA >60 >60 >60  GFRAA >60 >60 >60  PROT 7.3 6.8 7.1  ALBUMIN 4.1 3.5 3.8  AST 23 26 18   ALT 19 22 13*  ALKPHOS 103 99 74  BILITOT 0.3 0.4 0.5     ASSESSMENT & PLAN:   .Cancer of ascending colon metastatic to intra-abdominal lymph node ([place]) # Colon cancer- cecal/ right-sided; STAGE IV [intraabdominal/retroperitonel LN/Supracla; K-ras mutated]. Currently on 5FU palliative chemotherapy+ avastin. NOV 27th CT- Improved/CR except for LUL 4 mm lung nodules.   # Clinically no evidence of progression tolerating treatments fairly well except- low platelets 107;  proceed with 5FU+ Avastin today.   # Thrombocytopenia platelets 107/improved. okay to proceed with treatments.   # HTN- improved today.  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     # dental filling is okay.   #  Follow-up with me  In 3 weeks with chemotherapy Avastin; UA;CEA.     Cammie Sickle, MD 05/30/2016 7:58 AM

## 2016-05-29 NOTE — Assessment & Plan Note (Signed)
#  Colon cancer- cecal/ right-sided; STAGE IV [intraabdominal/retroperitonel LN/Supracla; K-ras mutated]. Currently on 5FU palliative chemotherapy+ avastin. NOV 27th CT- Improved/CR except for LUL 4 mm lung nodules.   # Clinically no evidence of progression tolerating treatments fairly well except- low platelets 107;  proceed with 5FU+ Avastin today.   # Thrombocytopenia platelets 107/improved. okay to proceed with treatments.   # HTN- improved today.  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     # dental filling is okay.   #  Follow-up with me  In 3 weeks with chemotherapy Avastin; UA;CEA.

## 2016-05-29 NOTE — Progress Notes (Signed)
Patient here today for follow up.  Patient is questioning seeing dentist about chipped tooth.

## 2016-05-30 LAB — CEA: CEA: 4.4 ng/mL (ref 0.0–4.7)

## 2016-05-31 ENCOUNTER — Inpatient Hospital Stay: Payer: BLUE CROSS/BLUE SHIELD

## 2016-05-31 VITALS — BP 109/74 | HR 74 | Temp 98.0°F | Resp 16

## 2016-05-31 DIAGNOSIS — C182 Malignant neoplasm of ascending colon: Secondary | ICD-10-CM

## 2016-05-31 DIAGNOSIS — C772 Secondary and unspecified malignant neoplasm of intra-abdominal lymph nodes: Principal | ICD-10-CM

## 2016-05-31 MED ORDER — HEPARIN SOD (PORK) LOCK FLUSH 100 UNIT/ML IV SOLN
500.0000 [IU] | Freq: Once | INTRAVENOUS | Status: AC | PRN
Start: 2016-05-31 — End: 2016-05-31
  Administered 2016-05-31: 500 [IU]

## 2016-05-31 MED ORDER — SODIUM CHLORIDE 0.9% FLUSH
10.0000 mL | INTRAVENOUS | Status: DC | PRN
  Administered 2016-05-31: 10 mL
  Filled 2016-05-31: qty 10

## 2016-06-07 DIAGNOSIS — C182 Malignant neoplasm of ascending colon: Secondary | ICD-10-CM | POA: Diagnosis not present

## 2016-06-19 ENCOUNTER — Inpatient Hospital Stay (HOSPITAL_BASED_OUTPATIENT_CLINIC_OR_DEPARTMENT_OTHER): Payer: BLUE CROSS/BLUE SHIELD | Admitting: Internal Medicine

## 2016-06-19 ENCOUNTER — Inpatient Hospital Stay: Payer: BLUE CROSS/BLUE SHIELD

## 2016-06-19 VITALS — BP 111/70 | HR 94 | Temp 96.1°F | Wt 108.4 lb

## 2016-06-19 DIAGNOSIS — C772 Secondary and unspecified malignant neoplasm of intra-abdominal lymph nodes: Secondary | ICD-10-CM | POA: Diagnosis not present

## 2016-06-19 DIAGNOSIS — R918 Other nonspecific abnormal finding of lung field: Secondary | ICD-10-CM

## 2016-06-19 DIAGNOSIS — Z87891 Personal history of nicotine dependence: Secondary | ICD-10-CM

## 2016-06-19 DIAGNOSIS — Z8042 Family history of malignant neoplasm of prostate: Secondary | ICD-10-CM

## 2016-06-19 DIAGNOSIS — I10 Essential (primary) hypertension: Secondary | ICD-10-CM

## 2016-06-19 DIAGNOSIS — D696 Thrombocytopenia, unspecified: Secondary | ICD-10-CM

## 2016-06-19 DIAGNOSIS — C182 Malignant neoplasm of ascending colon: Secondary | ICD-10-CM | POA: Diagnosis not present

## 2016-06-19 DIAGNOSIS — Z800 Family history of malignant neoplasm of digestive organs: Secondary | ICD-10-CM

## 2016-06-19 DIAGNOSIS — Z79899 Other long term (current) drug therapy: Secondary | ICD-10-CM

## 2016-06-19 DIAGNOSIS — G629 Polyneuropathy, unspecified: Secondary | ICD-10-CM

## 2016-06-19 LAB — URINALYSIS, COMPLETE (UACMP) WITH MICROSCOPIC
BACTERIA UA: NONE SEEN
BILIRUBIN URINE: NEGATIVE
Glucose, UA: NEGATIVE mg/dL
HGB URINE DIPSTICK: NEGATIVE
KETONES UR: NEGATIVE mg/dL
NITRITE: NEGATIVE
Protein, ur: NEGATIVE mg/dL
Specific Gravity, Urine: 1.015 (ref 1.005–1.030)
pH: 5 (ref 5.0–8.0)

## 2016-06-19 LAB — CBC WITH DIFFERENTIAL/PLATELET
BASOS PCT: 1 %
Basophils Absolute: 0 10*3/uL (ref 0–0.1)
EOS ABS: 0.1 10*3/uL (ref 0–0.7)
Eosinophils Relative: 2 %
HCT: 36.7 % (ref 35.0–47.0)
HEMOGLOBIN: 12.9 g/dL (ref 12.0–16.0)
Lymphocytes Relative: 31 %
Lymphs Abs: 1.8 10*3/uL (ref 1.0–3.6)
MCH: 31.7 pg (ref 26.0–34.0)
MCHC: 35 g/dL (ref 32.0–36.0)
MCV: 90.5 fL (ref 80.0–100.0)
MONOS PCT: 11 %
Monocytes Absolute: 0.7 10*3/uL (ref 0.2–0.9)
NEUTROS PCT: 55 %
Neutro Abs: 3.3 10*3/uL (ref 1.4–6.5)
PLATELETS: 130 10*3/uL — AB (ref 150–440)
RBC: 4.06 MIL/uL (ref 3.80–5.20)
RDW: 17.5 % — AB (ref 11.5–14.5)
WBC: 6 10*3/uL (ref 3.6–11.0)

## 2016-06-19 LAB — COMPREHENSIVE METABOLIC PANEL
ALBUMIN: 3.6 g/dL (ref 3.5–5.0)
ALK PHOS: 88 U/L (ref 38–126)
ALT: 13 U/L — ABNORMAL LOW (ref 14–54)
ANION GAP: 8 (ref 5–15)
AST: 22 U/L (ref 15–41)
BUN: 15 mg/dL (ref 6–20)
CHLORIDE: 106 mmol/L (ref 101–111)
CO2: 24 mmol/L (ref 22–32)
Calcium: 8.9 mg/dL (ref 8.9–10.3)
Creatinine, Ser: 0.9 mg/dL (ref 0.44–1.00)
GFR calc non Af Amer: >60 mL/min (ref >60)
GLUCOSE: 105 mg/dL — AB (ref 65–99)
POTASSIUM: 3.3 mmol/L — AB (ref 3.5–5.1)
Sodium: 138 mmol/L (ref 135–145)
Total Bilirubin: 0.6 mg/dL (ref 0.3–1.2)
Total Protein: 6.7 g/dL (ref 6.5–8.1)

## 2016-06-19 MED ORDER — BEVACIZUMAB CHEMO INJECTION 400 MG/16ML
5.0000 mg/kg | Freq: Once | INTRAVENOUS | Status: AC
  Administered 2016-06-19: 225 mg via INTRAVENOUS
  Filled 2016-06-19: qty 9

## 2016-06-19 MED ORDER — SODIUM CHLORIDE 0.9 % IV SOLN: Freq: Once | INTRAVENOUS | Status: DC

## 2016-06-19 MED ORDER — DEXAMETHASONE SODIUM PHOSPHATE 10 MG/ML IJ SOLN
10.0000 mg | Freq: Once | INTRAMUSCULAR | Status: AC
  Administered 2016-06-19: 10 mg via INTRAVENOUS
  Filled 2016-06-19: qty 1
  Filled 2016-06-19: qty 3

## 2016-06-19 MED ORDER — SODIUM CHLORIDE 0.9% FLUSH
10.0000 mL | INTRAVENOUS | Status: DC | PRN
  Administered 2016-06-19: 10 mL
  Filled 2016-06-19: qty 10

## 2016-06-19 MED ORDER — LEUCOVORIN CALCIUM INJECTION 100 MG
20.0000 mg/m2 | Freq: Once | INTRAMUSCULAR | Status: AC
  Administered 2016-06-19: 28 mg via INTRAVENOUS
  Filled 2016-06-19: qty 1.4

## 2016-06-19 MED ORDER — ONDANSETRON 8 MG PO TBDP
8.0000 mg | ORAL_TABLET | Freq: Once | ORAL | Status: AC
  Administered 2016-06-19: 8 mg via ORAL
  Filled 2016-06-19: qty 1

## 2016-06-19 MED ORDER — DEXTROSE 5 % IV SOLN: 400.0000 mg/m2 | Freq: Once | INTRAVENOUS | Status: DC

## 2016-06-19 MED ORDER — SODIUM CHLORIDE 0.9 % IV SOLN
2400.0000 mg/m2 | INTRAVENOUS | Status: DC
  Administered 2016-06-19: 3350 mg via INTRAVENOUS
  Filled 2016-06-19: qty 67

## 2016-06-19 MED ORDER — SODIUM CHLORIDE 0.9 % IV SOLN
Freq: Once | INTRAVENOUS | Status: AC
  Administered 2016-06-19: 10:00:00 via INTRAVENOUS
  Filled 2016-06-19: qty 1000

## 2016-06-19 NOTE — Progress Notes (Signed)
Patient here today for follow up.  Patient c/o neuropathy in fingers and toes that is not getting better with gabapentin

## 2016-06-19 NOTE — Progress Notes (Signed)
[facility] CONSULT NOTE  Patient Care Team: Coral Spikes, DO as PCP - General (Family Medicine)  CHIEF COMPLAINTS/PURPOSE OF CONSULTATION:   Oncology History   # [facility] 2017- COLON CANCER- STAGE IV [s/p right hemi-colectomy; pT4apN2 (7/19LN)]; Pre-op CEA- [id number]-  PET-  mesenteric adenopathy/mediastinal adenopathy/right-sided subclavicular lymph node.    April 17th -START FOLFOX; May 1st Add Avastin;   Aug 16th- Disc OX [sec PN]; con 5FU-Avastin; NOV 27th CT C/A/P- CR; except for 23m LUL; 5FU-Avastin q 3 W   # MOLECULAR TESTING: K-ras-EXON 2- MUTATED; MSI- STABLE.      Cancer of ascending colon metastatic to intra-abdominal lymph node ([place]   09/06/2015 Initial Diagnosis    Cancer of ascending colon metastatic to intra-abdominal lymph node (HCC)        HISTORY OF PRESENTING ILLNESS:  MMAYCEE BLASCO89y.o.  female  with a diagnosed Metastatic right-sided colon cancer status post 5-FU plus Avastin; Is here for follow-up.  Continues to have mild to moderate tingling and numbness in her hand and feet. Continues to be Neurontin at nighttime. No diarrhea no headaches. No epistaxis. No swelling in the legs. Appetite is good. Continued weight gain.  ROS: A complete 10 point review of system is done which is negative except mentioned above in history of present illness  MEDICAL HISTORY:  Past Medical History:  Diagnosis Date  . Chicken pox   . Colon cancer ([place]   . Hypertension     SURGICAL HISTORY: Past Surgical History:  Procedure Laterality Date  . BREAST BIOPSY Right    2007? pt unsure done by [facility]  . COLONOSCOPY    . COLONOSCOPY WITH PROPOFOL N/A 10/14/2015   Procedure: COLONOSCOPY WITH PROPOFOL;  Surgeon: PHulen Luster MD;  [facility];  Service: Endoscopy;  Laterality: N/A;  . COLOSTOMY REVISION Right 08/09/2015   Procedure: COLON RESECTION RIGHT;  Surgeon: RFlorene Glen MD;  Location: [facility] ORS;  Service: General;  Laterality:  Right;  . ECTOPIC PREGNANCY SURGERY    . PORTACATH PLACEMENT N/A 09/01/2015   Procedure: INSERTION PORT-A-CATH;  Surgeon: RFlorene Glen MD;  Location: [facility] ORS;  Service: General;  Laterality: N/A;  . SHOULDER SURGERY Left     [place]IAL [place]  1 pack in 3 days; no alcohol. 2 childern [25 & 17 years] Social History   Social History  . Marital status: Married    Spouse name: N/A  . Number of children: N/A  . Years of education: N/A   Occupational History  . Not on file.   Social History Main Topics  . Smoking status: Former Smoker    Packs/day: 0.25    Years: 2.00  . Smokeless tobacco: Never Used     Comment: recently quit  . Alcohol use No  . Drug use: No  . Sexual activity: Yes   Other Topics Concern  . Not on file   Social History Narrative  . No narrative on file    FAMILY HISTORY: 1 brother& 1 sister- no cancer. Mom-no cancer Family History  Problem Relation Age of Onset  . Hypertension Mother   . Arthritis Father   . Prostate cancer Father   . Diabetes Maternal Grandmother   . Colon cancer Maternal Grandfather     colon    ALLERGIES:  has No Known Allergies.  MEDICATIONS:  Current Outpatient Prescriptions  Medication Sig Dispense Refill  . acetaminophen (TYLENOL) 325 MG tablet Take 2 tablets (650  mg total) by mouth every 6 (six) hours as needed for mild pain (or Fever >/= 101).    . benazepril-hydrochlorthiazide (LOTENSIN HCT) 20-12.5 MG tablet Take 1 tablet by mouth daily. 60 tablet 3  . feeding supplement (BOOST HIGH PROTEIN) LIQD Take 1 Container by mouth daily.    . ferrous sulfate (FERROUSUL) 325 (65 FE) MG tablet Take 1 tablet (325 mg total) by mouth daily with breakfast. 90 tablet 3  . gabapentin (NEURONTIN) 300 MG capsule Take 1 capsule (300 mg total) by mouth at bedtime. 90 capsule 3  . lidocaine-prilocaine (EMLA) cream Apply 1 application topically as needed. Apply generously over the Mediport 45 minutes  prior to chemotherapy. 30 g 0  . Multiple Vitamins-Minerals (MULTIVITAMIN ADULT PO) Take 1 tablet by mouth daily.    . ondansetron (ZOFRAN) 8 MG tablet Take 1 tablet (8 mg total) by mouth every 8 (eight) hours as needed for nausea or vomiting (start 3 days; after chemo). 40 tablet 0  . potassium chloride SA (K-DUR,KLOR-CON) 20 MEQ tablet Take 2 tablets (40 mEq total) by mouth 2 (two) times daily. (Patient taking differently: Take 20 mEq by mouth daily. ) 60 tablet 3  . prochlorperazine (COMPAZINE) 10 MG tablet Take 1 tablet (10 mg total) by mouth every 6 (six) hours as needed for nausea or vomiting. 30 tablet 0   No current facility-administered medications for this visit.    Facility-Administered Medications Ordered in Other Visits  Medication Dose Route Frequency Provider Last Rate Last Dose  . fluorouracil (ADRUCIL) 3,350 mg in sodium chloride 0.9 % 83 mL chemo infusion  2,400 mg/m2 (Treatment Plan Recorded) Intravenous 1 day or 1 dose Cammie Sickle, MD   3,350 mg at 06/19/16 1115  . sodium chloride flush (NS) 0.9 % injection 10 mL  10 mL Intravenous PRN Cammie Sickle, MD   10 mL at 10/04/15 0901  . sodium chloride flush (NS) 0.9 % injection 10 mL  10 mL Intracatheter PRN Cammie Sickle, MD   10 mL at 06/19/16 0830      .  PHYSICAL EXAMINATION: ECOG PERFORMANCE STATUS: 0 - Asymptomatic  Vitals:   06/19/16 0857  BP: 111/70  Pulse: 94  Temp: (!) 96.1 F (35.6 C)   Filed Weights   06/19/16 0857  Weight: 108 lb 6 oz (49.2 kg)    GENERAL: Well-nourished well-developed; thin built; Alert, no distress and comfortable.   Accompanied by her mother.  EYES: no pallor or icterus OROPHARYNX: no thrush or ulceration; good dentition  NECK: supple, no masses felt LYMPH:  no palpable lymphadenopathy in the cervical, axillary or inguinal regions LUNGS: clear to auscultation and  No wheeze or crackles HEART/CVS: regular rate & rhythm and no murmurs; No lower extremity  edema ABDOMEN: abdomen soft, non-tender and normal bowel sounds; surgical incisions noted. No infections. Musculoskeletal:no cyanosis of digits and no clubbing  PSYCH: alert & oriented x 3 with fluent speech NEURO: no focal motor/sensory deficits SKIN:  no rashes or significant lesions  LABORATORY DATA:  I have reviewed the data as listed Lab Results  Component Value Date   WBC 6.0 06/19/2016   HGB 12.9 06/19/2016   HCT 36.7 06/19/2016   MCV 90.5 06/19/2016   PLT 130 (L) 06/19/2016    Recent Labs  04/24/16 0842 05/29/16 0903 06/19/16 0837  NA 138 137 138  K 3.6 3.7 3.3*  CL 105 107 106  CO2 24 24 24   GLUCOSE 104* 92 105*  BUN 18 20  15  CREATININE 0.84 0.79 0.90  CALCIUM 9.0 9.0 8.9  GFRNONAA >60 >60 >60  GFRAA >60 >60 >60  PROT 6.8 7.1 6.7  ALBUMIN 3.5 3.8 3.6  AST 26 18 22   ALT 22 13* 13*  ALKPHOS 99 74 88  BILITOT 0.4 0.5 0.6     ASSESSMENT & PLAN:   .Cancer of ascending colon metastatic to intra-abdominal lymph node (HCC) # Colon cancer- cecal/ right-sided; STAGE IV [intraabdominal/retroperitonel LN/Supracla; K-ras mutated]. Currently on 5FU palliative chemotherapy+ avastin. NOV 27th CT- Improved/CR except for LUL 4 mm lung nodules.   # Clinically no evidence of progression tolerating treatments fairly well except- low platelets  130;  proceed with 5FU+ Avastin today.   # Thrombocytopenia platelets 130/improved. okay to proceed with treatments.   # HTN- improved today.  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     # dental filling is okay.   #  Follow-up with me  In 3 weeks with chemotherapy Avastin; UA; will order CT scan at next visit.     Cammie Sickle, MD 06/19/2016 12:16 PM

## 2016-06-19 NOTE — Assessment & Plan Note (Addendum)
#  Colon cancer- cecal/ right-sided; STAGE IV [intraabdominal/retroperitonel LN/Supracla; K-ras mutated]. Currently on 5FU palliative chemotherapy+ avastin. NOV 27th CT- Improved/CR except for LUL 4 mm lung nodules.   # Clinically no evidence of progression tolerating treatments fairly well except- low platelets  130;  proceed with 5FU+ Avastin today.   # Thrombocytopenia platelets 130/improved. okay to proceed with treatments.   # HTN- improved today.  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     # dental filling is okay.   #  Follow-up with me  In 3 weeks with chemotherapy Avastin; UA; will order CT scan at next visit.

## 2016-06-20 LAB — CEA: CEA: 4.8 ng/mL — AB (ref 0.0–4.7)

## 2016-06-21 ENCOUNTER — Inpatient Hospital Stay: Payer: BLUE CROSS/BLUE SHIELD

## 2016-06-21 VITALS — BP 115/74 | HR 89 | Temp 96.4°F

## 2016-06-21 DIAGNOSIS — C182 Malignant neoplasm of ascending colon: Secondary | ICD-10-CM | POA: Diagnosis not present

## 2016-06-21 DIAGNOSIS — C772 Secondary and unspecified malignant neoplasm of intra-abdominal lymph nodes: Principal | ICD-10-CM

## 2016-06-21 MED ORDER — SODIUM CHLORIDE 0.9% FLUSH
10.0000 mL | INTRAVENOUS | Status: DC | PRN
  Administered 2016-06-21: 10 mL
  Filled 2016-06-21: qty 10

## 2016-06-21 MED ORDER — HEPARIN SOD (PORK) LOCK FLUSH 100 UNIT/ML IV SOLN
500.0000 [IU] | Freq: Once | INTRAVENOUS | Status: AC | PRN
  Administered 2016-06-21: 500 [IU]
  Filled 2016-06-21: qty 5

## 2016-06-30 ENCOUNTER — Telehealth: Payer: Self-pay | Admitting: *Deleted

## 2016-06-30 NOTE — Telephone Encounter (Signed)
Patient contacted RN to discuss completing out a matrix form for her clinical update on her condition, clinical information of prognosis, treatment dates and estimated return to work information. I explained to patient that this form has not yet been received at cancer center as of today I gave pt my fax number to see if [employer] could refax the forms. She also inquired if she could have a dental procedure to place a filling in her tooth. Cautioned pt given that she was on avastin, that this procedure may have risk of bleeding and infection. I will let Dr. Bradd Canary know that she needs a dental procedure, but pt will most likely be asked to hold off on this appointment given the fact she is on avastin.

## 2016-07-08 DIAGNOSIS — C182 Malignant neoplasm of ascending colon: Secondary | ICD-10-CM | POA: Diagnosis not present

## 2016-07-10 ENCOUNTER — Inpatient Hospital Stay: Payer: BLUE CROSS/BLUE SHIELD

## 2016-07-10 ENCOUNTER — Inpatient Hospital Stay: Payer: BLUE CROSS/BLUE SHIELD | Attending: Internal Medicine | Admitting: Internal Medicine

## 2016-07-10 ENCOUNTER — Encounter (INDEPENDENT_AMBULATORY_CARE_PROVIDER_SITE_OTHER): Payer: Self-pay

## 2016-07-10 VITALS — BP 130/67 | HR 76 | Temp 95.1°F | Wt 108.1 lb

## 2016-07-10 DIAGNOSIS — I10 Essential (primary) hypertension: Secondary | ICD-10-CM | POA: Insufficient documentation

## 2016-07-10 DIAGNOSIS — C182 Malignant neoplasm of ascending colon: Secondary | ICD-10-CM

## 2016-07-10 DIAGNOSIS — Z5112 Encounter for antineoplastic immunotherapy: Secondary | ICD-10-CM | POA: Diagnosis not present

## 2016-07-10 DIAGNOSIS — Z800 Family history of malignant neoplasm of digestive organs: Secondary | ICD-10-CM

## 2016-07-10 DIAGNOSIS — Z8042 Family history of malignant neoplasm of prostate: Secondary | ICD-10-CM | POA: Diagnosis not present

## 2016-07-10 DIAGNOSIS — Z79899 Other long term (current) drug therapy: Secondary | ICD-10-CM | POA: Insufficient documentation

## 2016-07-10 DIAGNOSIS — G629 Polyneuropathy, unspecified: Secondary | ICD-10-CM | POA: Diagnosis not present

## 2016-07-10 DIAGNOSIS — C772 Secondary and unspecified malignant neoplasm of intra-abdominal lymph nodes: Principal | ICD-10-CM

## 2016-07-10 DIAGNOSIS — D696 Thrombocytopenia, unspecified: Secondary | ICD-10-CM | POA: Diagnosis not present

## 2016-07-10 DIAGNOSIS — Z87891 Personal history of nicotine dependence: Secondary | ICD-10-CM | POA: Insufficient documentation

## 2016-07-10 DIAGNOSIS — R110 Nausea: Secondary | ICD-10-CM

## 2016-07-10 LAB — COMPREHENSIVE METABOLIC PANEL
ALT: 13 U/L — ABNORMAL LOW (ref 14–54)
AST: 20 U/L (ref 15–41)
Albumin: 3.8 g/dL (ref 3.5–5.0)
Alkaline Phosphatase: 84 U/L (ref 38–126)
Anion gap: 8 (ref 5–15)
BUN: 15 mg/dL (ref 6–20)
CHLORIDE: 105 mmol/L (ref 101–111)
CO2: 25 mmol/L (ref 22–32)
Calcium: 8.9 mg/dL (ref 8.9–10.3)
Creatinine, Ser: 0.78 mg/dL (ref 0.44–1.00)
GFR calc Af Amer: >60 mL/min (ref >60)
Glucose, Bld: 98 mg/dL (ref 65–99)
Potassium: 3.7 mmol/L (ref 3.5–5.1)
SODIUM: 138 mmol/L (ref 135–145)
Total Bilirubin: 0.5 mg/dL (ref 0.3–1.2)
Total Protein: 7.1 g/dL (ref 6.5–8.1)

## 2016-07-10 LAB — URINALYSIS, COMPLETE (UACMP) WITH MICROSCOPIC
BILIRUBIN URINE: NEGATIVE
Bacteria, UA: NONE SEEN
GLUCOSE, UA: NEGATIVE mg/dL
HGB URINE DIPSTICK: NEGATIVE
KETONES UR: NEGATIVE mg/dL
NITRITE: NEGATIVE
PH: 5 (ref 5.0–8.0)
Protein, ur: NEGATIVE mg/dL
Specific Gravity, Urine: 1.013 (ref 1.005–1.030)

## 2016-07-10 LAB — CBC WITH DIFFERENTIAL/PLATELET
Basophils Absolute: 0.1 10*3/uL (ref 0–0.1)
Basophils Relative: 1 %
EOS ABS: 0.1 10*3/uL (ref 0–0.7)
EOS PCT: 2 %
HCT: 38.1 % (ref 35.0–47.0)
Hemoglobin: 12.9 g/dL (ref 12.0–16.0)
LYMPHS PCT: 32 %
Lymphs Abs: 1.6 10*3/uL (ref 1.0–3.6)
MCH: 31.4 pg (ref 26.0–34.0)
MCHC: 34 g/dL (ref 32.0–36.0)
MCV: 92.3 fL (ref 80.0–100.0)
MONO ABS: 0.5 10*3/uL (ref 0.2–0.9)
Monocytes Relative: 10 %
Neutro Abs: 2.8 10*3/uL (ref 1.4–6.5)
Neutrophils Relative %: 55 %
PLATELETS: 140 10*3/uL — AB (ref 150–440)
RBC: 4.13 MIL/uL (ref 3.80–5.20)
RDW: 17.1 % — AB (ref 11.5–14.5)
WBC: 5 10*3/uL (ref 3.6–11.0)

## 2016-07-10 MED ORDER — DEXTROSE 5 % IV SOLN
Freq: Once | INTRAVENOUS | Status: DC
  Filled 2016-07-10: qty 1000

## 2016-07-10 MED ORDER — SODIUM CHLORIDE 0.9 % IV SOLN
5.0000 mg/kg | Freq: Once | INTRAVENOUS | Status: AC
  Administered 2016-07-10: 225 mg via INTRAVENOUS
  Filled 2016-07-10: qty 9

## 2016-07-10 MED ORDER — LEUCOVORIN CALCIUM INJECTION 350 MG: 400.0000 mg/m2 | Freq: Once | INTRAVENOUS | Status: DC

## 2016-07-10 MED ORDER — LEUCOVORIN CALCIUM INJECTION 100 MG
20.0000 mg/m2 | Freq: Once | INTRAMUSCULAR | Status: AC
  Administered 2016-07-10: 28 mg via INTRAVENOUS
  Filled 2016-07-10: qty 1.4

## 2016-07-10 MED ORDER — SODIUM CHLORIDE 0.9 % IV SOLN
Freq: Once | INTRAVENOUS | Status: AC
  Administered 2016-07-10: 10:00:00 via INTRAVENOUS
  Filled 2016-07-10: qty 1000

## 2016-07-10 MED ORDER — SODIUM CHLORIDE 0.9 % IV SOLN
Freq: Once | INTRAVENOUS | Status: AC
  Administered 2016-07-10: 8 mg via INTRAVENOUS
  Filled 2016-07-10: qty 4

## 2016-07-10 MED ORDER — GABAPENTIN 300 MG PO CAPS: 300.0000 mg | ORAL_CAPSULE | Freq: Two times a day (BID) | ORAL | 3 refills | Status: DC

## 2016-07-10 MED ORDER — SODIUM CHLORIDE 0.9% FLUSH
10.0000 mL | INTRAVENOUS | Status: DC | PRN
  Administered 2016-07-10: 10 mL
  Filled 2016-07-10: qty 10

## 2016-07-10 MED ORDER — SODIUM CHLORIDE 0.9 % IV SOLN
2400.0000 mg/m2 | INTRAVENOUS | Status: DC
  Administered 2016-07-10: 3350 mg via INTRAVENOUS
  Filled 2016-07-10: qty 67

## 2016-07-10 NOTE — Progress Notes (Signed)
[place] CONSULT NOTE  Patient Care Team: Coral Spikes, DO as PCP - General (Family Medicine)  CHIEF COMPLAINTS/PURPOSE OF CONSULTATION:   Oncology History   # [facility] 2017- COLON CANCER- STAGE IV [s/p right hemi-colectomy; pT4apN2 (7/19LN)]; Pre-op CEA- [id number]  PET-  mesenteric adenopathy/mediastinal adenopathy/right-sided subclavicular lymph node.    April 17th -START FOLFOX; May 1st Add Avastin;   Aug 16th- Disc OX [sec PN]; con 5FU-Avastin; NOV 27th CT C/A/P- CR; except for 50m LUL; 5FU-Avastin q 3 W   # MOLECULAR TESTING: K-ras-EXON 2- MUTATED; MSI- STABLE.      Cancer of ascending colon metastatic to intra-abdominal lymph node ([place]   09/06/2015 Initial Diagnosis    Cancer of ascending colon metastatic to intra-abdominal lymph node (HCC)        HISTORY OF PRESENTING ILLNESS:  Nancy PELLICANO89y.o.  female  with a diagnosed Metastatic right-sided colon cancer status post 5-FU plus Avastin; Is here for follow-up.  She unfortunately continues to have mild tingling and numbness in her hand and feet. She continues to be Neurontin.  However this is not interrupt her daily life significantly. No diarrhea no headaches. No epistaxis. No swelling in the legs. Appetite is good. Continued weight gain. She had mild nausea after the chemotherapy. No vomiting.  ROS: A complete 10 point review of system is done which is negative except mentioned above in history of present illness  MEDICAL HISTORY:  Past Medical History:  Diagnosis Date  . Chicken pox   . [place] cancer [place]   . Hypertension     SURGICAL HISTORY: Past Surgical History:  Procedure Laterality Date  . BREAST BIOPSY Right    2007? pt unsure done by [facility]  . COLONOSCOPY    . COLONOSCOPY WITH PROPOFOL N/A 10/14/2015   Procedure: COLONOSCOPY WITH PROPOFOL;  Surgeon: PHulen Luster MD;  [facility];  Service: Endoscopy;  Laterality: N/A;  . COLOSTOMY REVISION Right 08/09/2015    Procedure: COLON RESECTION RIGHT;  Surgeon: RFlorene Glen MD;  Location: [facility];  Service: General;  Laterality: Right;  . ECTOPIC PREGNANCY SURGERY    . PORTACATH PLACEMENT N/A 09/01/2015   Procedure: INSERTION PORT-A-CATH;  Surgeon: RFlorene Glen MD;  Location: [facility];  Service: General;  Laterality: N/A;  . SHOULDER SURGERY Left     [place]IAL H[place]  1 pack in 3 days; no alcohol. 2 childern [25 & 17 years] Social History   Social History  . Marital status: Married    Spouse name: N/A  . Number of children: N/A  . Years of education: N/A   Occupational History  . Not on file.   Social History Main Topics  . Smoking status: Former Smoker    Packs/day: 0.25    Years: 2.00  . Smokeless tobacco: Never Used     Comment: recently quit  . Alcohol use No  . Drug use: No  . Sexual activity: Yes   Other Topics Concern  . Not on file   Social History Narrative  . No narrative on file    FAMILY HISTORY: 1 brother& 1 sister- no cancer. Mom-no cancer Family History  Problem Relation Age of Onset  . Hypertension Mother   . Arthritis Father   . Prostate cancer Father   . Diabetes Maternal Grandmother   . Colon cancer Maternal Grandfather     colon    ALLERGIES:  has No Known Allergies.  MEDICATIONS:  Current  Outpatient Prescriptions  Medication Sig Dispense Refill  . acetaminophen (TYLENOL) 325 MG tablet Take 2 tablets (650 mg total) by mouth every 6 (six) hours as needed for mild pain (or Fever >/= 101).    . benazepril-hydrochlorthiazide (LOTENSIN HCT) 20-12.5 MG tablet Take 1 tablet by mouth daily. 60 tablet 3  . feeding supplement (BOOST HIGH PROTEIN) LIQD Take 1 Container by mouth daily.    . ferrous sulfate (FERROUSUL) 325 (65 FE) MG tablet Take 1 tablet (325 mg total) by mouth daily with breakfast. 90 tablet 3  . gabapentin (NEURONTIN) 300 MG capsule Take 1 capsule (300 mg total) by mouth 2 (two) times daily. 60 capsule  3  . lidocaine-prilocaine (EMLA) cream Apply 1 application topically as needed. Apply generously over the Mediport 45 minutes prior to chemotherapy. 30 g 0  . Multiple Vitamins-Minerals (MULTIVITAMIN ADULT PO) Take 1 tablet by mouth daily.    . ondansetron (ZOFRAN) 8 MG tablet Take 1 tablet (8 mg total) by mouth every 8 (eight) hours as needed for nausea or vomiting (start 3 days; after chemo). 40 tablet 0  . potassium chloride SA (K-DUR,KLOR-CON) 20 MEQ tablet Take 2 tablets (40 mEq total) by mouth 2 (two) times daily. (Patient taking differently: Take 20 mEq by mouth daily. ) 60 tablet 3  . prochlorperazine (COMPAZINE) 10 MG tablet Take 1 tablet (10 mg total) by mouth every 6 (six) hours as needed for nausea or vomiting. 30 tablet 0   No current facility-administered medications for this visit.    Facility-Administered Medications Ordered in Other Visits  Medication Dose Route Frequency Provider Last Rate Last Dose  . sodium chloride flush (NS) 0.9 % injection 10 mL  10 mL Intravenous PRN Cammie Sickle, MD   10 mL at 10/04/15 0901      .  PHYSICAL EXAMINATION: ECOG PERFORMANCE STATUS: 0 - Asymptomatic  Vitals:   07/10/16 0906  BP: 130/67  Pulse: 76  Temp: (!) 95.1 F (35.1 C)   Filed Weights   07/10/16 0906  Weight: 108 lb 2 oz (49 kg)    GENERAL: Well-nourished well-developed; thin built; Alert, no distress and comfortable.   Accompanied by her mother.  EYES: no pallor or icterus OROPHARYNX: no thrush or ulceration; good dentition  NECK: supple, no masses felt LYMPH:  no palpable lymphadenopathy in the cervical, axillary or inguinal regions LUNGS: clear to auscultation and  No wheeze or crackles HEART/CVS: regular rate & rhythm and no murmurs; No lower extremity edema ABDOMEN: abdomen soft, non-tender and normal bowel sounds; surgical incisions noted. No infections. Musculoskeletal:no cyanosis of digits and no clubbing  PSYCH: alert & oriented x 3 with fluent  speech NEURO: no focal motor/sensory deficits SKIN:  no rashes or significant lesions  LABORATORY DATA:  I have reviewed the data as listed Lab Results  Component Value Date   WBC 5.0 07/10/2016   HGB 12.9 07/10/2016   HCT 38.1 07/10/2016   MCV 92.3 07/10/2016   PLT 140 (L) 07/10/2016    Recent Labs  05/29/16 0903 06/19/16 0837 07/10/16 0842  NA 137 138 138  K 3.7 3.3* 3.7  CL 107 106 105  CO2 _0 GLUCOSE 92 105* 98  BUN _1 CREATININE 0.79 0.90 0.78  CALCIUM 9.0 8.9 8.9  GFRNONAA >60 >60 >60  GFRAA >60 >60 >60  PROT 7.1 6.7 7.1  ALBUMIN 3.8 3.6 3.8  AST _2 ALT 13* 13* 13*  ALKPHOS 74 88  32  BILITOT 0.5 0.6 0.5     ASSESSMENT & PLAN:   .Cancer of ascending colon metastatic to intra-abdominal lymph node (HCC) # Colon cancer- cecal/ right-sided; STAGE IV [intraabdominal/retroperitonel LN/Supracla; K-ras mutated]. Currently on 5FU palliative chemotherapy+ avastin. NOV 27th CT- Improved/CR except for LUL 4 mm lung nodules. Will order CT scan today.   # Clinically no evidence of progression tolerating treatments fairly well except- low platelets  140;  proceed with 5FU+ Avastin today.   # Thrombocytopenia platelets 140/improved. okay to proceed with treatments.   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   #  Follow-up with me  In 3 weeks with chemotherapy Avastin; UA; CT scans prior.    Cammie Sickle, MD 07/10/2016 5:09 PM

## 2016-07-10 NOTE — Progress Notes (Signed)
Patient here today for follow up.  Patient states no new concerns today  

## 2016-07-10 NOTE — Assessment & Plan Note (Addendum)
#  Colon cancer- cecal/ right-sided; STAGE IV [intraabdominal/retroperitonel LN/Supracla; K-ras mutated]. Currently on 5FU palliative chemotherapy+ avastin. NOV 27th CT- Improved/CR except for LUL 4 mm lung nodules. Will order CT scan today.   # Clinically no evidence of progression tolerating treatments fairly well except- low platelets  140;  proceed with 5FU+ Avastin today.   # Thrombocytopenia platelets 140/improved. okay to proceed with treatments.   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   #  Follow-up with me  In 3 weeks with chemotherapy Avastin; UA; CT scans prior.

## 2016-07-12 ENCOUNTER — Inpatient Hospital Stay: Payer: BLUE CROSS/BLUE SHIELD

## 2016-07-12 DIAGNOSIS — C772 Secondary and unspecified malignant neoplasm of intra-abdominal lymph nodes: Principal | ICD-10-CM

## 2016-07-12 DIAGNOSIS — C182 Malignant neoplasm of ascending colon: Secondary | ICD-10-CM

## 2016-07-12 MED ORDER — HEPARIN SOD (PORK) LOCK FLUSH 100 UNIT/ML IV SOLN
INTRAVENOUS | Status: AC
  Filled 2016-07-12: qty 5

## 2016-07-12 MED ORDER — SODIUM CHLORIDE 0.9% FLUSH
10.0000 mL | INTRAVENOUS | Status: DC | PRN
  Administered 2016-07-12: 10 mL
  Filled 2016-07-12: qty 10

## 2016-07-12 MED ORDER — HEPARIN SOD (PORK) LOCK FLUSH 100 UNIT/ML IV SOLN
500.0000 [IU] | Freq: Once | INTRAVENOUS | Status: AC | PRN
  Administered 2016-07-12: 500 [IU]

## 2016-07-28 ENCOUNTER — Ambulatory Visit
Admission: RE | Admit: 2016-07-28 | Discharge: 2016-07-28 | Disposition: A | Discharge status: Home / Self Care | Payer: BLUE CROSS/BLUE SHIELD | Source: Ambulatory Visit | Attending: Internal Medicine | Admitting: Internal Medicine

## 2016-07-28 ENCOUNTER — Telehealth: Payer: Self-pay | Admitting: *Deleted

## 2016-07-28 DIAGNOSIS — C182 Malignant neoplasm of ascending colon: Secondary | ICD-10-CM

## 2016-07-28 DIAGNOSIS — I700 Atherosclerosis of aorta: Secondary | ICD-10-CM | POA: Diagnosis not present

## 2016-07-28 DIAGNOSIS — G9589 Other specified diseases of spinal cord: Secondary | ICD-10-CM | POA: Diagnosis not present

## 2016-07-28 DIAGNOSIS — I2510 Atherosclerotic heart disease of native coronary artery without angina pectoris: Secondary | ICD-10-CM | POA: Insufficient documentation

## 2016-07-28 DIAGNOSIS — Z9889 Other specified postprocedural states: Secondary | ICD-10-CM | POA: Diagnosis not present

## 2016-07-28 DIAGNOSIS — C772 Secondary and unspecified malignant neoplasm of intra-abdominal lymph nodes: Secondary | ICD-10-CM | POA: Diagnosis present

## 2016-07-28 MED ORDER — IOPAMIDOL (ISOVUE-300) INJECTION 61%
85.0000 mL | Freq: Once | INTRAVENOUS | Status: AC | PRN
  Administered 2016-07-28: 85 mL via INTRAVENOUS

## 2016-07-28 NOTE — Telephone Encounter (Signed)
Called report of CT from this morning. Report printed and handed to Dr Rogue Bussing

## 2016-07-31 ENCOUNTER — Inpatient Hospital Stay: Payer: BLUE CROSS/BLUE SHIELD

## 2016-07-31 ENCOUNTER — Inpatient Hospital Stay: Payer: BLUE CROSS/BLUE SHIELD | Attending: Internal Medicine | Admitting: Internal Medicine

## 2016-07-31 VITALS — BP 104/68 | HR 95 | Temp 96.7°F | Resp 18 | Wt 108.2 lb

## 2016-07-31 DIAGNOSIS — Z800 Family history of malignant neoplasm of digestive organs: Secondary | ICD-10-CM | POA: Diagnosis not present

## 2016-07-31 DIAGNOSIS — I10 Essential (primary) hypertension: Secondary | ICD-10-CM | POA: Diagnosis not present

## 2016-07-31 DIAGNOSIS — Z79899 Other long term (current) drug therapy: Secondary | ICD-10-CM | POA: Diagnosis not present

## 2016-07-31 DIAGNOSIS — G620 Drug-induced polyneuropathy: Secondary | ICD-10-CM | POA: Diagnosis not present

## 2016-07-31 DIAGNOSIS — Z8042 Family history of malignant neoplasm of prostate: Secondary | ICD-10-CM | POA: Diagnosis not present

## 2016-07-31 DIAGNOSIS — T451X5S Adverse effect of antineoplastic and immunosuppressive drugs, sequela: Secondary | ICD-10-CM | POA: Diagnosis not present

## 2016-07-31 DIAGNOSIS — C182 Malignant neoplasm of ascending colon: Secondary | ICD-10-CM

## 2016-07-31 DIAGNOSIS — Z5112 Encounter for antineoplastic immunotherapy: Secondary | ICD-10-CM | POA: Diagnosis not present

## 2016-07-31 DIAGNOSIS — C772 Secondary and unspecified malignant neoplasm of intra-abdominal lymph nodes: Secondary | ICD-10-CM | POA: Diagnosis not present

## 2016-07-31 DIAGNOSIS — D696 Thrombocytopenia, unspecified: Secondary | ICD-10-CM | POA: Insufficient documentation

## 2016-07-31 DIAGNOSIS — I2510 Atherosclerotic heart disease of native coronary artery without angina pectoris: Secondary | ICD-10-CM | POA: Diagnosis not present

## 2016-07-31 DIAGNOSIS — R918 Other nonspecific abnormal finding of lung field: Secondary | ICD-10-CM | POA: Insufficient documentation

## 2016-07-31 DIAGNOSIS — I700 Atherosclerosis of aorta: Secondary | ICD-10-CM | POA: Diagnosis not present

## 2016-07-31 DIAGNOSIS — C7951 Secondary malignant neoplasm of bone: Secondary | ICD-10-CM | POA: Diagnosis not present

## 2016-07-31 DIAGNOSIS — Z87891 Personal history of nicotine dependence: Secondary | ICD-10-CM | POA: Diagnosis not present

## 2016-07-31 DIAGNOSIS — Z9049 Acquired absence of other specified parts of digestive tract: Secondary | ICD-10-CM | POA: Insufficient documentation

## 2016-07-31 LAB — URINALYSIS, COMPLETE (UACMP) WITH MICROSCOPIC
Bacteria, UA: NONE SEEN
Bilirubin Urine: NEGATIVE
Glucose, UA: NEGATIVE mg/dL
Hgb urine dipstick: NEGATIVE
Ketones, ur: NEGATIVE mg/dL
Leukocytes, UA: NEGATIVE
Nitrite: NEGATIVE
Protein, ur: NEGATIVE mg/dL
Specific Gravity, Urine: 1.018 (ref 1.005–1.030)
pH: 5 (ref 5.0–8.0)

## 2016-07-31 LAB — CBC WITH DIFFERENTIAL/PLATELET
BASOS ABS: 0.1 10*3/uL (ref 0–0.1)
BASOS PCT: 1 %
Eosinophils Absolute: 0.1 10*3/uL (ref 0–0.7)
Eosinophils Relative: 2 %
HEMATOCRIT: 38.7 % (ref 35.0–47.0)
HEMOGLOBIN: 13.2 g/dL (ref 12.0–16.0)
LYMPHS PCT: 26 %
Lymphs Abs: 1.4 10*3/uL (ref 1.0–3.6)
MCH: 32.3 pg (ref 26.0–34.0)
MCHC: 34.1 g/dL (ref 32.0–36.0)
MCV: 94.6 fL (ref 80.0–100.0)
MONO ABS: 0.5 10*3/uL (ref 0.2–0.9)
MONOS PCT: 10 %
NEUTROS ABS: 3.1 10*3/uL (ref 1.4–6.5)
NEUTROS PCT: 61 %
Platelets: 139 10*3/uL — ABNORMAL LOW (ref 150–440)
RBC: 4.09 MIL/uL (ref 3.80–5.20)
RDW: 16.2 % — ABNORMAL HIGH (ref 11.5–14.5)
WBC: 5.2 10*3/uL (ref 3.6–11.0)

## 2016-07-31 LAB — COMPREHENSIVE METABOLIC PANEL
ALBUMIN: 3.9 g/dL (ref 3.5–5.0)
ALK PHOS: 80 U/L (ref 38–126)
ALT: 12 U/L — ABNORMAL LOW (ref 14–54)
AST: 24 U/L (ref 15–41)
Anion gap: 8 (ref 5–15)
BILIRUBIN TOTAL: 0.4 mg/dL (ref 0.3–1.2)
BUN: 20 mg/dL (ref 6–20)
CALCIUM: 8.8 mg/dL — AB (ref 8.9–10.3)
CO2: 25 mmol/L (ref 22–32)
Chloride: 105 mmol/L (ref 101–111)
Creatinine, Ser: 0.9 mg/dL (ref 0.44–1.00)
GFR calc Af Amer: >60 mL/min (ref >60)
GFR calc non Af Amer: >60 mL/min (ref >60)
GLUCOSE: 107 mg/dL — AB (ref 65–99)
Potassium: 3.4 mmol/L — ABNORMAL LOW (ref 3.5–5.1)
Sodium: 138 mmol/L (ref 135–145)
TOTAL PROTEIN: 7.2 g/dL (ref 6.5–8.1)

## 2016-07-31 MED ORDER — SODIUM CHLORIDE 0.9 % IV SOLN
Freq: Once | INTRAVENOUS | Status: AC
  Administered 2016-07-31: 11:00:00 via INTRAVENOUS
  Filled 2016-07-31: qty 1000

## 2016-07-31 MED ORDER — SODIUM CHLORIDE 0.9 % IV SOLN
Freq: Once | INTRAVENOUS | Status: AC
  Administered 2016-07-31: 8 mg via INTRAVENOUS
  Filled 2016-07-31: qty 4

## 2016-07-31 MED ORDER — LEUCOVORIN CALCIUM INJECTION 350 MG: 400.0000 mg/m2 | Freq: Once | INTRAMUSCULAR | Status: DC

## 2016-07-31 MED ORDER — BEVACIZUMAB CHEMO INJECTION 400 MG/16ML
5.0000 mg/kg | Freq: Once | INTRAVENOUS | Status: AC
  Administered 2016-07-31: 225 mg via INTRAVENOUS
  Filled 2016-07-31: qty 9

## 2016-07-31 MED ORDER — SODIUM CHLORIDE 0.9 % IV SOLN
2400.0000 mg/m2 | INTRAVENOUS | Status: DC
  Administered 2016-07-31: 3350 mg via INTRAVENOUS
  Filled 2016-07-31: qty 67

## 2016-07-31 MED ORDER — DEXTROSE 5 % IV SOLN
Freq: Once | INTRAVENOUS | Status: DC
  Filled 2016-07-31: qty 1000

## 2016-07-31 MED ORDER — LEUCOVORIN CALCIUM INJECTION 100 MG
20.0000 mg/m2 | Freq: Once | INTRAMUSCULAR | Status: AC
  Administered 2016-07-31: 28 mg via INTRAVENOUS
  Filled 2016-07-31: qty 1.4

## 2016-07-31 MED ORDER — GABAPENTIN 300 MG PO CAPS: ORAL_CAPSULE | ORAL | 3 refills | Status: DC

## 2016-07-31 MED ORDER — HEPARIN SOD (PORK) LOCK FLUSH 100 UNIT/ML IV SOLN
500.0000 [IU] | Freq: Once | INTRAVENOUS | Status: DC | PRN
Start: 2016-07-31 — End: 2016-07-31

## 2016-07-31 NOTE — Progress Notes (Signed)
Patient here today for follow up.  Patient states no new concerns today  

## 2016-07-31 NOTE — Progress Notes (Signed)
[place] CONSULT NOTE  Patient Care Team: Coral Spikes, DO as PCP - General (Family Medicine)  CHIEF COMPLAINTS/PURPOSE OF CONSULTATION:   Oncology History   # [facility] 2017- COLON CANCER- STAGE IV [s/p right hemi-colectomy; pT4apN2 (7/19LN)]; Pre-op CEA- [id number]  PET-  mesenteric adenopathy/mediastinal adenopathy/right-sided subclavicular lymph node.    April 17th -START FOLFOX; May 1st Add Avastin;   Aug 16th- Disc OX [sec PN]; con 5FU-Avastin; NOV 27th CT C/A/P- CR; except for 48m LUL; 5FU-Avastin q 3 W   # MOLECULAR TESTING: K-ras-EXON 2- MUTATED; MSI- STABLE.      Cancer of ascending colon metastatic to intra-abdominal lymph node ([place]   09/06/2015 Initial Diagnosis    Cancer of ascending colon metastatic to intra-abdominal lymph node (HCC)        HISTORY OF PRESENTING ILLNESS:  Nancy HOSTON89y.o.  female  with a diagnosed Metastatic right-sided colon cancer status post 5-FU plus Avastin; Is here for follow-up/ To review the results of restaging CAT scan.  She unfortunately continues to have mild tingling and numbness in her hand and feet; she feels the neuropathy is getting worse going up to her knees at this time. She continues to be Neurontin 300 mg twice a day. No diarrhea no headaches. No epistaxis. No swelling in the legs. Appetite is good. Continued weight gain. No vomiting.   ROS: A complete 10 point review of system is done which is negative except mentioned above in history of present illness  MEDICAL HISTORY:  Past Medical History:  Diagnosis Date  . Chicken pox   . [place]    Partial colectomy 07/2015 + chemo tx's  . Hypertension     SURGICAL HISTORY: Past Surgical History:  Procedure Laterality Date  . BREAST BIOPSY Right    2007? pt unsure done by [facility]  . COLONOSCOPY    . COLONOSCOPY WITH PROPOFOL N/A 10/14/2015   Procedure: COLONOSCOPY WITH PROPOFOL;  Surgeon: PHulen Luster MD;  [facility];  Service:  Endoscopy;  Laterality: N/A;  . COLOSTOMY REVISION Right 08/09/2015   Procedure: COLON RESECTION RIGHT;  Surgeon: RFlorene Glen MD;  Location: [facility];  Service: General;  Laterality: Right;  . ECTOPIC PREGNANCY SURGERY    . PORTACATH PLACEMENT N/A 09/01/2015   Procedure: INSERTION PORT-A-CATH;  Surgeon: RFlorene Glen MD;  Location: [facility];  Service: General;  Laterality: N/A;  . SHOULDER SURGERY Left     [place]  1 pack in 3 days; no alcohol. 2 childern [25 & 17 years] Social History   Social History  . Marital status: Married    Spouse name: N/A  . Number of children: N/A  . Years of education: N/A   Occupational History  . Not on file.   Social History Main Topics  . Smoking status: Former Smoker    Packs/day: 0.25    Years: 2.00  . Smokeless tobacco: Never Used     Comment: recently quit  . Alcohol use No  . Drug use: No  . Sexual activity: Yes   Other Topics Concern  . Not on file   Social History Narrative  . No narrative on file    FAMILY HISTORY: 1 brother& 1 sister- no cancer. Mom-no cancer Family History  Problem Relation Age of Onset  . Hypertension Mother   . Arthritis Father   . Prostate cancer Father   . Diabetes Maternal Grandmother   . Colon cancer  Maternal Grandfather     colon    ALLERGIES:  has No Known Allergies.  MEDICATIONS:  Current Outpatient Prescriptions  Medication Sig Dispense Refill  . acetaminophen (TYLENOL) 325 MG tablet Take 2 tablets (650 mg total) by mouth every 6 (six) hours as needed for mild pain (or Fever >/= 101).    . benazepril-hydrochlorthiazide (LOTENSIN HCT) 20-12.5 MG tablet Take 1 tablet by mouth daily. 60 tablet 3  . feeding supplement (BOOST HIGH PROTEIN) LIQD Take 1 Container by mouth daily.    . ferrous sulfate (FERROUSUL) 325 (65 FE) MG tablet Take 1 tablet (325 mg total) by mouth daily with breakfast. 90 tablet 3  . gabapentin (NEURONTIN) 300 MG capsule  One pill in am; and 2 pills at night prior to sleep. 90 capsule 3  . lidocaine-prilocaine (EMLA) cream Apply 1 application topically as needed. Apply generously over the Mediport 45 minutes prior to chemotherapy. 30 g 0  . Multiple Vitamins-Minerals (MULTIVITAMIN ADULT PO) Take 1 tablet by mouth daily.    . ondansetron (ZOFRAN) 8 MG tablet Take 1 tablet (8 mg total) by mouth every 8 (eight) hours as needed for nausea or vomiting (start 3 days; after chemo). 40 tablet 0  . potassium chloride SA (K-DUR,KLOR-CON) 20 MEQ tablet Take 2 tablets (40 mEq total) by mouth 2 (two) times daily. (Patient taking differently: Take 20 mEq by mouth daily. ) 60 tablet 3  . prochlorperazine (COMPAZINE) 10 MG tablet Take 1 tablet (10 mg total) by mouth every 6 (six) hours as needed for nausea or vomiting. 30 tablet 0   No current facility-administered medications for this visit.    Facility-Administered Medications Ordered in Other Visits  Medication Dose Route Frequency Provider Last Rate Last Dose  . dextrose 5 % solution   Intravenous Once Cammie Sickle, MD      . fluorouracil (ADRUCIL) 3,350 mg in sodium chloride 0.9 % 83 mL chemo infusion  2,400 mg/m2 (Treatment Plan Recorded) Intravenous 1 day or 1 dose Cammie Sickle, MD   3,350 mg at 07/31/16 1213  . heparin lock flush 100 unit/mL  500 Units Intracatheter Once PRN Cammie Sickle, MD      . sodium chloride flush (NS) 0.9 % injection 10 mL  10 mL Intravenous PRN Cammie Sickle, MD   10 mL at 10/04/15 0901      .  PHYSICAL EXAMINATION: ECOG PERFORMANCE STATUS: 0 - Asymptomatic  Vitals:   07/31/16 1012  BP: 104/68  Pulse: 95  Resp: 18  Temp: (!) 96.7 F (35.9 C)   Filed Weights   07/31/16 1012  Weight: 108 lb 4 oz (49.1 kg)    GENERAL: Well-nourished well-developed; thin built; Alert, no distress and comfortable.   Accompanied by her mother.  EYES: no pallor or icterus OROPHARYNX: no thrush or ulceration; good dentition   NECK: supple, no masses felt LYMPH:  no palpable lymphadenopathy in the cervical, axillary or inguinal regions LUNGS: clear to auscultation and  No wheeze or crackles HEART/CVS: regular rate & rhythm and no murmurs; No lower extremity edema ABDOMEN: abdomen soft, non-tender and normal bowel sounds; surgical incisions noted. No infections. Musculoskeletal:no cyanosis of digits and no clubbing  PSYCH: alert & oriented x 3 with fluent speech NEURO: no focal motor/sensory deficits SKIN:  no rashes or significant lesions  LABORATORY DATA:  I have reviewed the data as listed Lab Results  Component Value Date   WBC 5.2 07/31/2016   HGB 13.2 07/31/2016  HCT 38.7 07/31/2016   MCV 94.6 07/31/2016   PLT 139 (L) 07/31/2016    Recent Labs  06/19/16 0837 07/10/16 0842 07/31/16 0932  NA 138 138 138  K 3.3* 3.7 3.4*  CL 106 105 105  CO2 24 25 25   GLUCOSE 105* 98 107*  BUN 15 15 20   CREATININE 0.90 0.78 0.90  CALCIUM 8.9 8.9 8.8*  GFRNONAA >60 >60 >60  GFRAA >60 >60 >60  PROT 6.7 7.1 7.2  ALBUMIN 3.6 3.8 3.9  AST 22 20 24   ALT 13* 13* 12*  ALKPHOS 88 84 80  BILITOT 0.6 0.5 0.4   IMPRESSION: 1. Increasing area of mixed lucency and sclerosis in the left side of the L2 vertebral body, concerning for a new metastatic lesion. Further evaluation with bone scan is recommended. 2. No other definite sites of metastatic disease noted elsewhere in the chest, abdomen or pelvis. 3. Previously noted tiny pulmonary nodules near the apex of the left upper lobe are stable compared to prior studies, favored to be benign. 4. Aortic atherosclerosis, in addition to right coronary artery disease. Please note that although the presence of coronary artery calcium documents the presence of coronary artery disease, the severity of this disease and any potential stenosis cannot be assessed on this non-gated CT examination. Assessment for potential risk factor modification, dietary therapy or  pharmacologic therapy may be warranted, if clinically indicated. 5. Additional incidental findings, as above. These results will be called to the ordering clinician or representative by the Radiologist Assistant, and communication documented in the PACS or zVision Dashboard.   Electronically Signed   By: Vinnie Langton M.D.   On: 07/28/2016 11:06   ASSESSMENT & PLAN:   .Cancer of ascending colon metastatic to intra-abdominal lymph node ([place]) # Colon cancer- cecal/ right-sided; STAGE IV [intraabdominal/retroperitonel LN/Supracla; K-ras mutated]. Currently on 5FU palliative chemotherapy+ avastin. MARCH 9th th CT- Improved/CR except for LUL 4 mm lung nodules; L2- new lytic/sclerotic lesion. Will order bone scan today.  # Clinically no evidence of progression tolerating treatments fairly well except- low platelets  139;  proceed with 5FU+ Avastin today.   # Thrombocytopenia platelets 139/improved. okay to proceed with treatments.   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  new script given.   # Bone lesion L-2/asymtomatic- ? Bone remodelling from chemo; continue current chemo. Discussed re: X-geva; discussed re: ONJ; hypocalcemia; recommend ca+ vit D.   #  Follow-up with me  In 3 weeks with chemotherapy Avastin; X-geva; UA; bone scan  Prior.  # I reviewed the blood work- with the patient in detail; also reviewed the imaging independently [as summarized above]; and with the patient in detail.      Cammie Sickle, MD 07/31/2016 2:08 PM

## 2016-07-31 NOTE — Assessment & Plan Note (Addendum)
#  Colon cancer- cecal/ right-sided; STAGE IV [intraabdominal/retroperitonel LN/Supracla; K-ras mutated]. Currently on 5FU palliative chemotherapy+ avastin. MARCH 9th th CT- Improved/CR except for LUL 4 mm lung nodules; L2- new lytic/sclerotic lesion. Will order bone scan today.  # Clinically no evidence of progression tolerating treatments fairly well except- low platelets  139;  proceed with 5FU+ Avastin today.   # Thrombocytopenia platelets 139/improved. okay to proceed with treatments.   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  new script given.   # Bone lesion L-2/asymtomatic- ? Bone remodelling from chemo; continue current chemo. Discussed re: X-geva; discussed re: ONJ; hypocalcemia; recommend ca+ vit D.   #  Follow-up with me  In 3 weeks with chemotherapy Avastin; X-geva; UA; bone scan  Prior.  # I reviewed the blood work- with the patient in detail; also reviewed the imaging independently [as summarized above]; and with the patient in detail.

## 2016-08-01 LAB — CEA: CEA: 5.2 ng/mL — ABNORMAL HIGH (ref 0.0–4.7)

## 2016-08-02 ENCOUNTER — Inpatient Hospital Stay: Payer: BLUE CROSS/BLUE SHIELD

## 2016-08-02 ENCOUNTER — Encounter: Payer: Self-pay | Admitting: *Deleted

## 2016-08-02 VITALS — BP 120/79 | HR 90 | Resp 20

## 2016-08-02 DIAGNOSIS — C182 Malignant neoplasm of ascending colon: Secondary | ICD-10-CM | POA: Diagnosis not present

## 2016-08-02 DIAGNOSIS — C772 Secondary and unspecified malignant neoplasm of intra-abdominal lymph nodes: Principal | ICD-10-CM

## 2016-08-02 MED ORDER — HEPARIN SOD (PORK) LOCK FLUSH 100 UNIT/ML IV SOLN
500.0000 [IU] | Freq: Once | INTRAVENOUS | Status: AC | PRN
  Administered 2016-08-02: 500 [IU]

## 2016-08-11 ENCOUNTER — Encounter: Payer: Self-pay | Admitting: *Deleted

## 2016-08-16 ENCOUNTER — Encounter
Admission: RE | Admit: 2016-08-16 | Discharge: 2016-08-16 | Disposition: A | Discharge status: Home / Self Care | Payer: BLUE CROSS/BLUE SHIELD | Source: Ambulatory Visit | Attending: Internal Medicine | Admitting: Internal Medicine

## 2016-08-16 DIAGNOSIS — C182 Malignant neoplasm of ascending colon: Secondary | ICD-10-CM | POA: Insufficient documentation

## 2016-08-16 DIAGNOSIS — C772 Secondary and unspecified malignant neoplasm of intra-abdominal lymph nodes: Secondary | ICD-10-CM | POA: Diagnosis present

## 2016-08-16 MED ORDER — TECHNETIUM TC 99M MEDRONATE IV KIT
25.0000 | PACK | Freq: Once | INTRAVENOUS | Status: AC | PRN
  Administered 2016-08-16: 20 via INTRAVENOUS

## 2016-08-21 ENCOUNTER — Inpatient Hospital Stay: Payer: BLUE CROSS/BLUE SHIELD

## 2016-08-21 ENCOUNTER — Inpatient Hospital Stay: Payer: BLUE CROSS/BLUE SHIELD | Attending: Internal Medicine

## 2016-08-21 ENCOUNTER — Inpatient Hospital Stay (HOSPITAL_BASED_OUTPATIENT_CLINIC_OR_DEPARTMENT_OTHER): Payer: BLUE CROSS/BLUE SHIELD | Admitting: Internal Medicine

## 2016-08-21 VITALS — BP 103/72 | HR 86 | Temp 97.0°F | Ht 60.0 in | Wt 109.6 lb

## 2016-08-21 DIAGNOSIS — Z79899 Other long term (current) drug therapy: Secondary | ICD-10-CM | POA: Insufficient documentation

## 2016-08-21 DIAGNOSIS — R918 Other nonspecific abnormal finding of lung field: Secondary | ICD-10-CM

## 2016-08-21 DIAGNOSIS — Z800 Family history of malignant neoplasm of digestive organs: Secondary | ICD-10-CM

## 2016-08-21 DIAGNOSIS — I2510 Atherosclerotic heart disease of native coronary artery without angina pectoris: Secondary | ICD-10-CM | POA: Diagnosis not present

## 2016-08-21 DIAGNOSIS — Z8042 Family history of malignant neoplasm of prostate: Secondary | ICD-10-CM | POA: Insufficient documentation

## 2016-08-21 DIAGNOSIS — C182 Malignant neoplasm of ascending colon: Secondary | ICD-10-CM

## 2016-08-21 DIAGNOSIS — D696 Thrombocytopenia, unspecified: Secondary | ICD-10-CM | POA: Diagnosis not present

## 2016-08-21 DIAGNOSIS — Z9049 Acquired absence of other specified parts of digestive tract: Secondary | ICD-10-CM

## 2016-08-21 DIAGNOSIS — Z87891 Personal history of nicotine dependence: Secondary | ICD-10-CM

## 2016-08-21 DIAGNOSIS — I700 Atherosclerosis of aorta: Secondary | ICD-10-CM

## 2016-08-21 DIAGNOSIS — I10 Essential (primary) hypertension: Secondary | ICD-10-CM | POA: Insufficient documentation

## 2016-08-21 DIAGNOSIS — C772 Secondary and unspecified malignant neoplasm of intra-abdominal lymph nodes: Secondary | ICD-10-CM | POA: Diagnosis not present

## 2016-08-21 DIAGNOSIS — M545 Low back pain: Secondary | ICD-10-CM | POA: Diagnosis not present

## 2016-08-21 DIAGNOSIS — Z5112 Encounter for antineoplastic immunotherapy: Secondary | ICD-10-CM | POA: Insufficient documentation

## 2016-08-21 DIAGNOSIS — M899 Disorder of bone, unspecified: Secondary | ICD-10-CM | POA: Insufficient documentation

## 2016-08-21 LAB — COMPREHENSIVE METABOLIC PANEL
ALK PHOS: 68 U/L (ref 38–126)
ALT: 14 U/L (ref 14–54)
ANION GAP: 6 (ref 5–15)
AST: 18 U/L (ref 15–41)
Albumin: 3.8 g/dL (ref 3.5–5.0)
BILIRUBIN TOTAL: 0.4 mg/dL (ref 0.3–1.2)
BUN: 15 mg/dL (ref 6–20)
CO2: 24 mmol/L (ref 22–32)
CREATININE: 0.78 mg/dL (ref 0.44–1.00)
Calcium: 9 mg/dL (ref 8.9–10.3)
Chloride: 109 mmol/L (ref 101–111)
Glucose, Bld: 89 mg/dL (ref 65–99)
Potassium: 3.8 mmol/L (ref 3.5–5.1)
Sodium: 139 mmol/L (ref 135–145)
TOTAL PROTEIN: 7.1 g/dL (ref 6.5–8.1)

## 2016-08-21 LAB — CBC WITH DIFFERENTIAL/PLATELET
Basophils Absolute: 0.1 10*3/uL (ref 0–0.1)
Basophils Relative: 1 %
EOS ABS: 0.1 10*3/uL (ref 0–0.7)
Eosinophils Relative: 3 %
HEMATOCRIT: 37.3 % (ref 35.0–47.0)
HEMOGLOBIN: 12.9 g/dL (ref 12.0–16.0)
LYMPHS ABS: 1.7 10*3/uL (ref 1.0–3.6)
LYMPHS PCT: 32 %
MCH: 32.3 pg (ref 26.0–34.0)
MCHC: 34.5 g/dL (ref 32.0–36.0)
MCV: 93.8 fL (ref 80.0–100.0)
MONOS PCT: 13 %
Monocytes Absolute: 0.7 10*3/uL (ref 0.2–0.9)
NEUTROS ABS: 2.7 10*3/uL (ref 1.4–6.5)
NEUTROS PCT: 51 %
Platelets: 159 10*3/uL (ref 150–440)
RBC: 3.97 MIL/uL (ref 3.80–5.20)
RDW: 16.2 % — ABNORMAL HIGH (ref 11.5–14.5)
WBC: 5.3 10*3/uL (ref 3.6–11.0)

## 2016-08-21 LAB — URINALYSIS, COMPLETE (UACMP) WITH MICROSCOPIC
BILIRUBIN URINE: NEGATIVE
Glucose, UA: NEGATIVE mg/dL
Hgb urine dipstick: NEGATIVE
Ketones, ur: NEGATIVE mg/dL
NITRITE: NEGATIVE
PH: 5 (ref 5.0–8.0)
Protein, ur: NEGATIVE mg/dL
SPECIFIC GRAVITY, URINE: 1.012 (ref 1.005–1.030)

## 2016-08-21 MED ORDER — LEUCOVORIN CALCIUM INJECTION 100 MG
28.0000 mg | Freq: Once | INTRAMUSCULAR | Status: AC
  Administered 2016-08-21: 28 mg via INTRAVENOUS
  Filled 2016-08-21: qty 1.4

## 2016-08-21 MED ORDER — SODIUM CHLORIDE 0.9 % IV SOLN
Freq: Once | INTRAVENOUS | Status: AC
  Administered 2016-08-21: 10:00:00 via INTRAVENOUS
  Filled 2016-08-21: qty 1000

## 2016-08-21 MED ORDER — LEUCOVORIN CALCIUM INJECTION 350 MG: 400.0000 mg/m2 | Freq: Once | INTRAVENOUS | Status: DC

## 2016-08-21 MED ORDER — SODIUM CHLORIDE 0.9% FLUSH
10.0000 mL | INTRAVENOUS | Status: DC | PRN
Start: 2016-08-21 — End: 2016-08-21
  Administered 2016-08-21: 10 mL via INTRAVENOUS
  Filled 2016-08-21: qty 10

## 2016-08-21 MED ORDER — HEPARIN SOD (PORK) LOCK FLUSH 100 UNIT/ML IV SOLN: 500.0000 [IU] | Freq: Once | INTRAVENOUS | Status: DC

## 2016-08-21 MED ORDER — DEXTROSE 5 % IV SOLN
Freq: Once | INTRAVENOUS | Status: DC
  Filled 2016-08-21: qty 1000

## 2016-08-21 MED ORDER — SODIUM CHLORIDE 0.9 % IV SOLN
2400.0000 mg/m2 | INTRAVENOUS | Status: DC
  Administered 2016-08-21: 3350 mg via INTRAVENOUS
  Filled 2016-08-21: qty 67

## 2016-08-21 MED ORDER — SODIUM CHLORIDE 0.9 % IV SOLN
5.0000 mg/kg | Freq: Once | INTRAVENOUS | Status: AC
  Administered 2016-08-21: 225 mg via INTRAVENOUS
  Filled 2016-08-21: qty 9

## 2016-08-21 MED ORDER — SODIUM CHLORIDE 0.9 % IV SOLN
Freq: Once | INTRAVENOUS | Status: AC
  Administered 2016-08-21: 8 mg via INTRAVENOUS
  Filled 2016-08-21: qty 4

## 2016-08-21 NOTE — Progress Notes (Signed)
[place] CONSULT NOTE  Patient Care Team: Coral Spikes, DO as PCP - General (Family Medicine)  CHIEF COMPLAINTS/PURPOSE OF CONSULTATION:   Oncology History   # [facility] 2017- COLON CANCER- STAGE IV [s/p right hemi-colectomy; pT4apN2 (7/19LN)]; Pre-op CEA- [id number]  PET-  mesenteric adenopathy/mediastinal adenopathy/right-sided subclavicular lymph node.    April 17th -START FOLFOX; May 1st Add Avastin;   Aug 16th- Disc [name] [place] PN]; con 5FU-Avastin; NOV 27th CT C/A/P- CR; except for 71m LUL; 5FU-Avastin q 3 W  # March 2018- L2 uptake ? Met- hold x-geva for now.    # MOLECULAR TESTING: K-ras-EXON 2- MUTATED; MSI- STABLE.      Cancer of ascending colon metastatic to intra-abdominal lymph node ([place]   09/06/2015 Initial Diagnosis    Cancer of ascending colon metastatic to intra-abdominal lymph node (HCC)        HISTORY OF PRESENTING ILLNESS:  MSOREN PIGMAN89y.o.  female  with a diagnosed Metastatic right-sided colon cancer status post 5-FU plus Avastin; Is here for follow-up/ To review the results of restaging Bone scan.    No diarrhea no headaches. No epistaxis. No swelling in the legs. Appetite is good. Continued weight gain. No vomiting. No diarrhea. Denies  any back pain.   ROS: A complete 10 point review of system is done which is negative except mentioned above in history of present illness  MEDICAL HISTORY:  Past Medical History:  Diagnosis Date  . Chicken pox   . [place]    Partial colectomy 07/2015 + chemo tx's  . Hypertension   . Hypokalemia   . Menopause    > 5 yrs  . Peripheral neuropathy due to chemotherapy ([facility]     SURGICAL HISTORY: Past Surgical History:  Procedure Laterality Date  . BREAST BIOPSY Right    2007? pt unsure done by [facility]  . COLONOSCOPY    . COLONOSCOPY WITH PROPOFOL N/A 10/14/2015   Procedure: COLONOSCOPY WITH PROPOFOL;  Surgeon: PHulen Luster MD;  [facility];  Service: Endoscopy;  Laterality:  N/A;  . COLOSTOMY REVISION Right 08/09/2015   Procedure: COLON RESECTION RIGHT;  Surgeon: RFlorene Glen MD;  Location: [facility] ORS;  Service: General;  Laterality: Right;  . ECTOPIC PREGNANCY SURGERY    . PORTACATH PLACEMENT N/A 09/01/2015   Procedure: INSERTION PORT-A-CATH;  Surgeon: RFlorene Glen MD;  Location: [facility] ORS;  Service: General;  Laterality: N/A;  . SHOULDER SURGERY Left     [place]  1 pack in 3 days; no alcohol. 2 childern [25 & 17 years] Social History   Social History  . Marital status: Married    Spouse name: N/A  . Number of children: N/A  . Years of education: N/A   Occupational History  . Not on file.   Social History Main Topics  . Smoking status: Former Smoker    Packs/day: 0.25    Years: 2.00  . Smokeless tobacco: Never Used     Comment: recently quit  . Alcohol use No  . Drug use: No  . Sexual activity: Yes   Other Topics Concern  . Not on file   Social History Narrative  . No narrative on file    FAMILY HISTORY: 1 brother& 1 sister- no cancer. Mom-no cancer Family History  Problem Relation Age of Onset  . Hypertension Mother   . Arthritis Father   . Prostate cancer Father   . Diabetes Maternal Grandmother   .  Colon cancer Maternal Grandfather     colon    ALLERGIES:  has No Known Allergies.  MEDICATIONS:  Current Outpatient Prescriptions  Medication Sig Dispense Refill  . acetaminophen (TYLENOL) 325 MG tablet Take 2 tablets (650 mg total) by mouth every 6 (six) hours as needed for mild pain (or Fever >/= 101).    . benazepril-hydrochlorthiazide (LOTENSIN HCT) 20-12.5 MG tablet Take 1 tablet by mouth daily. 60 tablet 3  . feeding supplement (BOOST HIGH PROTEIN) LIQD Take 1 Container by mouth daily.    . ferrous sulfate (FERROUSUL) 325 (65 FE) MG tablet Take 1 tablet (325 mg total) by mouth daily with breakfast. 90 tablet 3  . gabapentin (NEURONTIN) 300 MG capsule One pill in am; and 2  pills at night prior to sleep. 90 capsule 3  . lidocaine-prilocaine (EMLA) cream Apply 1 application topically as needed. Apply generously over the Mediport 45 minutes prior to chemotherapy. 30 g 0  . Multiple Vitamins-Minerals (MULTIVITAMIN ADULT PO) Take 1 tablet by mouth daily.    . ondansetron (ZOFRAN) 8 MG tablet Take 1 tablet (8 mg total) by mouth every 8 (eight) hours as needed for nausea or vomiting (start 3 days; after chemo). 40 tablet 0  . potassium chloride SA (K-DUR,KLOR-CON) 20 MEQ tablet Take 2 tablets (40 mEq total) by mouth 2 (two) times daily. (Patient taking differently: Take 20 mEq by mouth daily. ) 60 tablet 3  . prochlorperazine (COMPAZINE) 10 MG tablet Take 1 tablet (10 mg total) by mouth every 6 (six) hours as needed for nausea or vomiting. 30 tablet 0   No current facility-administered medications for this visit.    Facility-Administered Medications Ordered in Other Visits  Medication Dose Route Frequency Provider Last Rate Last Dose  . sodium chloride flush (NS) 0.9 % injection 10 mL  10 mL Intravenous PRN Cammie Sickle, MD   10 mL at 10/04/15 0901      .  PHYSICAL EXAMINATION: ECOG PERFORMANCE STATUS: 0 - Asymptomatic  Vitals:   08/21/16 0857  BP: 103/72  Pulse: 86  Temp: 97 F (36.1 C)   Filed Weights   08/21/16 0857  Weight: 109 lb 9.6 oz (49.7 kg)    GENERAL: Well-nourished well-developed; thin built; Alert, no distress and comfortable.   Accompanied by her mother.  EYES: no pallor or icterus OROPHARYNX: no thrush or ulceration; good dentition  NECK: supple, no masses felt LYMPH:  no palpable lymphadenopathy in the cervical, axillary or inguinal regions LUNGS: clear to auscultation and  No wheeze or crackles HEART/CVS: regular rate & rhythm and no murmurs; No lower extremity edema ABDOMEN: abdomen soft, non-tender and normal bowel sounds; surgical incisions noted. No infections. Musculoskeletal:no cyanosis of digits and no clubbing  PSYCH:  alert & oriented x 3 with fluent speech NEURO: no focal motor/sensory deficits SKIN:  no rashes or significant lesions  LABORATORY DATA:  I have reviewed the data as listed Lab Results  Component Value Date   WBC 5.3 08/21/2016   HGB 12.9 08/21/2016   HCT 37.3 08/21/2016   MCV 93.8 08/21/2016   PLT 159 08/21/2016    Recent Labs  07/10/16 0842 07/31/16 0932 08/21/16 0841  NA 138 138 139  K 3.7 3.4* 3.8  CL 105 105 109  CO2 25 25 24   GLUCOSE 98 107* 89  BUN 15 20 15   CREATININE 0.78 0.90 0.78  CALCIUM 8.9 8.8* 9.0  GFRNONAA >60 >60 >60  GFRAA >60 >60 >60  PROT 7.1 7.2  7.1  ALBUMIN 3.8 3.9 3.8  AST 20 24 18   ALT 13* 12* 14  ALKPHOS 84 80 68  BILITOT 0.5 0.4 0.4   IMPRESSION: 1. Increasing area of mixed lucency and sclerosis in the left side of the L2 vertebral body, concerning for a new metastatic lesion. Further evaluation with bone scan is recommended. 2. No other definite sites of metastatic disease noted elsewhere in the chest, abdomen or pelvis. 3. Previously noted tiny pulmonary nodules near the apex of the left upper lobe are stable compared to prior studies, favored to be benign. 4. Aortic atherosclerosis, in addition to right coronary artery disease. Please note that although the presence of coronary artery calcium documents the presence of coronary artery disease, the severity of this disease and any potential stenosis cannot be assessed on this non-gated CT examination. Assessment for potential risk factor modification, dietary therapy or pharmacologic therapy may be warranted, if clinically indicated. 5. Additional incidental findings, as above. These results will be called to the ordering clinician or representative by the Radiologist Assistant, and communication documented in the PACS or zVision Dashboard.   Electronically Signed   By: Vinnie Langton M.D.   On: 07/28/2016  11:06 -------------------------------------------------------------------------------------------------------------- IMPRESSION: Focal area of increased activity noted over the left portion of the L2 vertebral body. This corresponds to abnormal vertebral body noted on recent CT. Although these changes may be degenerative, a process such as metastatic disease cannot be excluded. Degenerative changes noted elsewhere. No other focal abnormalities noted to suggest metastatic disease .   Electronically Signed   By: Marcello Moores  Register   On: 08/16/2016 14:19   ASSESSMENT & PLAN:   .Cancer of ascending colon metastatic to intra-abdominal lymph node ([place]) # Colon cancer- cecal/ right-sided; STAGE IV [intraabdominal/retroperitonel LN/Supracla; K-ras mutated]. Currently on 5FU palliative chemotherapy+ avastin. MARCH 9th th CT- Improved/CR except for LUL 4 mm lung nodules; L2- new lytic/sclerotic lesion. Bone scan-below.   # Clinically no evidence of progression tolerating treatments fairly well. Labs today reviewed;  acceptable for treatment today.  proceed with 5FU+ Avastin today.   # Thrombocytopenia platelets 159 today/improved- resolved.   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  # Bone lesion L-2/asymtomatic- ? Bone remodelling from chemo; march 2018- bone scan slight uptake at L2- met vs degenerative changes. Monitor for now.  continue current chemo. HOLD X-geva for now.   #  Follow-up with me  In 3 weeks with chemotherapy Avastin; X-geva; UA;   # I reviewed the blood work- with the patient in detail; also reviewed the imaging independently [as summarized above]; and with the patient in detail.     Cammie Sickle, MD 08/22/2016 1:52 PM

## 2016-08-21 NOTE — Assessment & Plan Note (Addendum)
#  Colon cancer- cecal/ right-sided; STAGE IV [intraabdominal/retroperitonel LN/Supracla; K-ras mutated]. Currently on 5FU palliative chemotherapy+ avastin. MARCH 9th th CT- Improved/CR except for LUL 4 mm lung nodules; L2- new lytic/sclerotic lesion. Bone scan-below.   # Clinically no evidence of progression tolerating treatments fairly well. Labs today reviewed;  acceptable for treatment today.  proceed with 5FU+ Avastin today.   # Thrombocytopenia platelets 159 today/improved- resolved.   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  # Bone lesion L-2/asymtomatic- ? Bone remodelling from chemo; march 2018- bone scan slight uptake at L2- met vs degenerative changes. Monitor for now.  continue current chemo. HOLD X-geva for now.   #  Follow-up with me  In 3 weeks with chemotherapy Avastin; X-geva; UA;   # I reviewed the blood work- with the patient in detail; also reviewed the imaging independently [as summarized above]; and with the patient in detail.

## 2016-08-21 NOTE — Progress Notes (Signed)
Patient here for follow up. No changes since last appointment.  

## 2016-08-23 ENCOUNTER — Inpatient Hospital Stay: Payer: BLUE CROSS/BLUE SHIELD

## 2016-08-23 VITALS — BP 122/79 | HR 63

## 2016-08-23 DIAGNOSIS — C182 Malignant neoplasm of ascending colon: Secondary | ICD-10-CM

## 2016-08-23 DIAGNOSIS — C772 Secondary and unspecified malignant neoplasm of intra-abdominal lymph nodes: Principal | ICD-10-CM

## 2016-08-23 MED ORDER — SODIUM CHLORIDE 0.9% FLUSH
10.0000 mL | INTRAVENOUS | Status: DC | PRN
  Administered 2016-08-23: 10 mL
  Filled 2016-08-23: qty 10

## 2016-08-23 MED ORDER — HEPARIN SOD (PORK) LOCK FLUSH 100 UNIT/ML IV SOLN
500.0000 [IU] | Freq: Once | INTRAVENOUS | Status: AC | PRN
  Administered 2016-08-23: 500 [IU]
  Filled 2016-08-23: qty 5

## 2016-09-11 ENCOUNTER — Inpatient Hospital Stay: Payer: BLUE CROSS/BLUE SHIELD

## 2016-09-11 ENCOUNTER — Inpatient Hospital Stay (HOSPITAL_BASED_OUTPATIENT_CLINIC_OR_DEPARTMENT_OTHER): Payer: BLUE CROSS/BLUE SHIELD | Admitting: Internal Medicine

## 2016-09-11 VITALS — BP 120/81 | HR 78 | Temp 97.8°F | Resp 20 | Ht 60.0 in | Wt 108.9 lb

## 2016-09-11 DIAGNOSIS — R112 Nausea with vomiting, unspecified: Secondary | ICD-10-CM

## 2016-09-11 DIAGNOSIS — R918 Other nonspecific abnormal finding of lung field: Secondary | ICD-10-CM

## 2016-09-11 DIAGNOSIS — C772 Secondary and unspecified malignant neoplasm of intra-abdominal lymph nodes: Secondary | ICD-10-CM

## 2016-09-11 DIAGNOSIS — C182 Malignant neoplasm of ascending colon: Secondary | ICD-10-CM

## 2016-09-11 DIAGNOSIS — I700 Atherosclerosis of aorta: Secondary | ICD-10-CM

## 2016-09-11 DIAGNOSIS — M545 Low back pain: Secondary | ICD-10-CM

## 2016-09-11 DIAGNOSIS — M899 Disorder of bone, unspecified: Secondary | ICD-10-CM

## 2016-09-11 DIAGNOSIS — Z800 Family history of malignant neoplasm of digestive organs: Secondary | ICD-10-CM

## 2016-09-11 DIAGNOSIS — I2510 Atherosclerotic heart disease of native coronary artery without angina pectoris: Secondary | ICD-10-CM

## 2016-09-11 DIAGNOSIS — Z9049 Acquired absence of other specified parts of digestive tract: Secondary | ICD-10-CM | POA: Diagnosis not present

## 2016-09-11 DIAGNOSIS — T451X5A Adverse effect of antineoplastic and immunosuppressive drugs, initial encounter: Secondary | ICD-10-CM

## 2016-09-11 DIAGNOSIS — Z95828 Presence of other vascular implants and grafts: Secondary | ICD-10-CM

## 2016-09-11 DIAGNOSIS — D696 Thrombocytopenia, unspecified: Secondary | ICD-10-CM

## 2016-09-11 DIAGNOSIS — I10 Essential (primary) hypertension: Secondary | ICD-10-CM

## 2016-09-11 DIAGNOSIS — C7951 Secondary malignant neoplasm of bone: Secondary | ICD-10-CM

## 2016-09-11 DIAGNOSIS — Z8042 Family history of malignant neoplasm of prostate: Secondary | ICD-10-CM

## 2016-09-11 DIAGNOSIS — Z87891 Personal history of nicotine dependence: Secondary | ICD-10-CM

## 2016-09-11 DIAGNOSIS — Z79899 Other long term (current) drug therapy: Secondary | ICD-10-CM

## 2016-09-11 LAB — URINALYSIS, COMPLETE (UACMP) WITH MICROSCOPIC
BACTERIA UA: NONE SEEN
Bilirubin Urine: NEGATIVE
GLUCOSE, UA: NEGATIVE mg/dL
HGB URINE DIPSTICK: NEGATIVE
Ketones, ur: NEGATIVE mg/dL
NITRITE: NEGATIVE
PROTEIN: NEGATIVE mg/dL
Specific Gravity, Urine: 1.016 (ref 1.005–1.030)
pH: 5 (ref 5.0–8.0)

## 2016-09-11 LAB — CBC WITH DIFFERENTIAL/PLATELET
Basophils Absolute: 0.1 10*3/uL (ref 0–0.1)
Basophils Relative: 1 %
EOS ABS: 0.1 10*3/uL (ref 0–0.7)
EOS PCT: 2 %
HCT: 38.4 % (ref 35.0–47.0)
HEMOGLOBIN: 13 g/dL (ref 12.0–16.0)
Lymphocytes Relative: 24 %
Lymphs Abs: 1.5 10*3/uL (ref 1.0–3.6)
MCH: 31.7 pg (ref 26.0–34.0)
MCHC: 33.7 g/dL (ref 32.0–36.0)
MCV: 93.8 fL (ref 80.0–100.0)
MONOS PCT: 12 %
Monocytes Absolute: 0.7 10*3/uL (ref 0.2–0.9)
NEUTROS PCT: 61 %
Neutro Abs: 3.8 10*3/uL (ref 1.4–6.5)
PLATELETS: 131 10*3/uL — AB (ref 150–440)
RBC: 4.09 MIL/uL (ref 3.80–5.20)
RDW: 16.1 % — ABNORMAL HIGH (ref 11.5–14.5)
WBC: 6.2 10*3/uL (ref 3.6–11.0)

## 2016-09-11 LAB — COMPREHENSIVE METABOLIC PANEL
ALBUMIN: 3.7 g/dL (ref 3.5–5.0)
ALK PHOS: 90 U/L (ref 38–126)
ALT: 14 U/L (ref 14–54)
ANION GAP: 7 (ref 5–15)
AST: 21 U/L (ref 15–41)
BUN: 12 mg/dL (ref 6–20)
CHLORIDE: 106 mmol/L (ref 101–111)
CO2: 25 mmol/L (ref 22–32)
Calcium: 8.6 mg/dL — ABNORMAL LOW (ref 8.9–10.3)
Creatinine, Ser: 0.83 mg/dL (ref 0.44–1.00)
GFR calc non Af Amer: >60 mL/min (ref >60)
GLUCOSE: 99 mg/dL (ref 65–99)
POTASSIUM: 3.5 mmol/L (ref 3.5–5.1)
SODIUM: 138 mmol/L (ref 135–145)
Total Bilirubin: 0.4 mg/dL (ref 0.3–1.2)
Total Protein: 6.8 g/dL (ref 6.5–8.1)

## 2016-09-11 MED ORDER — SODIUM CHLORIDE 0.9 % IV SOLN
2400.0000 mg/m2 | INTRAVENOUS | Status: DC
  Administered 2016-09-11: 3350 mg via INTRAVENOUS
  Filled 2016-09-11: qty 67

## 2016-09-11 MED ORDER — ONDANSETRON 8 MG PO TBDP
8.0000 mg | ORAL_TABLET | Freq: Once | ORAL | Status: AC
  Administered 2016-09-11: 8 mg via ORAL
  Filled 2016-09-11: qty 1

## 2016-09-11 MED ORDER — SODIUM CHLORIDE 0.9 % IV SOLN: Freq: Once | INTRAVENOUS | Status: DC

## 2016-09-11 MED ORDER — HYDROCODONE-ACETAMINOPHEN 5-325 MG PO TABS: 1.0000 | ORAL_TABLET | Freq: Three times a day (TID) | ORAL | 0 refills | Status: DC | PRN

## 2016-09-11 MED ORDER — PROCHLORPERAZINE MALEATE 10 MG PO TABS
10.0000 mg | ORAL_TABLET | Freq: Four times a day (QID) | ORAL | 6 refills | Status: DC | PRN
Start: 2016-09-11 — End: 2016-09-21

## 2016-09-11 MED ORDER — LEUCOVORIN CALCIUM INJECTION 100 MG
20.0000 mg/m2 | Freq: Once | INTRAMUSCULAR | Status: AC
  Administered 2016-09-11: 28 mg via INTRAVENOUS
  Filled 2016-09-11: qty 1.4

## 2016-09-11 MED ORDER — DULOXETINE HCL 20 MG PO CPEP: 20.0000 mg | ORAL_CAPSULE | Freq: Every day | ORAL | 3 refills | Status: DC

## 2016-09-11 MED ORDER — LIDOCAINE-PRILOCAINE 2.5-2.5 % EX CREA: 1.0000 "application " | TOPICAL_CREAM | CUTANEOUS | 6 refills | Status: DC | PRN

## 2016-09-11 MED ORDER — SODIUM CHLORIDE 0.9 % IV SOLN
5.0000 mg/kg | Freq: Once | INTRAVENOUS | Status: AC
  Administered 2016-09-11: 225 mg via INTRAVENOUS
  Filled 2016-09-11: qty 9

## 2016-09-11 MED ORDER — SODIUM CHLORIDE 0.9 % IV SOLN
Freq: Once | INTRAVENOUS | Status: AC
  Administered 2016-09-11: 10:00:00 via INTRAVENOUS
  Filled 2016-09-11: qty 1000

## 2016-09-11 MED ORDER — SODIUM CHLORIDE 0.9% FLUSH
10.0000 mL | INTRAVENOUS | Status: DC | PRN
  Administered 2016-09-11: 10 mL
  Filled 2016-09-11: qty 10

## 2016-09-11 MED ORDER — LEUCOVORIN CALCIUM INJECTION 350 MG: 400.0000 mg/m2 | Freq: Once | INTRAVENOUS | Status: DC

## 2016-09-11 MED ORDER — DENOSUMAB 120 MG/1.7ML ~~LOC~~ SOLN
120.0000 mg | Freq: Once | SUBCUTANEOUS | Status: AC
  Administered 2016-09-11: 120 mg via SUBCUTANEOUS
  Filled 2016-09-11: qty 1.7

## 2016-09-11 MED ORDER — DEXAMETHASONE SODIUM PHOSPHATE 10 MG/ML IJ SOLN
10.0000 mg | Freq: Once | INTRAMUSCULAR | Status: AC
  Administered 2016-09-11: 10 mg via INTRAVENOUS
  Filled 2016-09-11: qty 1

## 2016-09-11 NOTE — Progress Notes (Signed)
[place] CONSULT NOTE  Patient Care Team: Coral Spikes, DO as PCP - General (Family Medicine)  CHIEF COMPLAINTS/PURPOSE OF CONSULTATION:   Oncology History   # [facility] 2017- COLON CANCER- STAGE IV [s/p right hemi-colectomy; pT4apN2 (7/19LN)]; Pre-op CEA- [id number]  PET-  mesenteric adenopathy/mediastinal adenopathy/right-sided subclavicular lymph node.    April 17th -START FOLFOX; May 1st Add Avastin;   Aug 16th- Disc [name] [place] PN]; con 5FU-Avastin; NOV 27th CT C/A/P- CR; except for 75m LUL; 5FU-Avastin q 3 W  # March 2018- L2 uptake ? Met- hold x-geva for now.    # MOLECULAR TESTING: K-ras-EXON 2- MUTATED; MSI- STABLE.      Cancer of ascending colon metastatic to [place]abdominal lymph node [place]   09/06/2015 Initial Diagnosis    Cancer of ascending colon metastatic to intra-abdominal lymph node (HCC)        HISTORY OF PRESENTING ILLNESS:  MGEORGIANN NEIDER89y.o.  female  with a diagnosed Metastatic right-sided colon cancer status post 5-FU plus Avastin; Is here for follow-up.   Otherwise patient complains of worsening back pain /lower back in the last few weeks. She has been taking Tylenol without any improvement. Denies any new onset of tingling and numbness in the hand and feet. However the tingling and numbness in her feet is not any better. She is on Neurontin. No diarrhea no headaches. No epistaxis. No swelling in the legs. Appetite is good. Continued weight gain.   ROS: A complete 10 point review of system is done which is negative except mentioned above in history of present illness  MEDICAL HISTORY:  Past Medical History:  Diagnosis Date  . Chicken pox   . [place] cancer ([place]    Partial colectomy 07/2015 + chemo tx's  . Hypertension   . Hypokalemia   . Menopause    > 5 yrs  . Peripheral neuropathy due to chemotherapy ([facility]     SURGICAL HISTORY: Past Surgical History:  Procedure Laterality Date  . BREAST BIOPSY Right    2007? pt unsure  done by [facility]  . COLONOSCOPY    . COLONOSCOPY WITH PROPOFOL N/A 10/14/2015   Procedure: COLONOSCOPY WITH PROPOFOL;  Surgeon: PHulen Luster MD;  [facility];  Service: Endoscopy;  Laterality: N/A;  . COLOSTOMY REVISION Right 08/09/2015   Procedure: COLON RESECTION RIGHT;  Surgeon: RFlorene Glen MD;  Location: [facility] ORS;  Service: General;  Laterality: Right;  . ECTOPIC PREGNANCY SURGERY    . PORTACATH PLACEMENT N/A 09/01/2015   Procedure: INSERTION PORT-A-CATH;  Surgeon: RFlorene Glen MD;  Location: [facility] ORS;  Service: General;  Laterality: N/A;  . SHOULDER SURGERY Left     [place]IAL H[place]  1 pack in 3 days; no alcohol. 2 childern [25 & 17 years] Social History   Social History  . Marital status: Married    Spouse name: N/A  . Number of children: N/A  . Years of education: N/A   Occupational History  . Not on file.   Social History Main Topics  . Smoking status: Former Smoker    Packs/day: 0.25    Years: 2.00  . Smokeless tobacco: Never Used     Comment: recently quit  . Alcohol use No  . Drug use: No  . Sexual activity: Yes   Other Topics Concern  . Not on file   Social History Narrative  . No narrative on file    FAMILY HISTORY: 1 brother& 1 sister-  no cancer. Mom-no cancer Family History  Problem Relation Age of Onset  . Hypertension Mother   . Arthritis Father   . Prostate cancer Father   . Diabetes Maternal Grandmother   . Colon cancer Maternal Grandfather     colon    ALLERGIES:  has No Known Allergies.  MEDICATIONS:  Current Outpatient Prescriptions  Medication Sig Dispense Refill  . benazepril-hydrochlorthiazide (LOTENSIN HCT) 20-12.5 MG tablet Take 1 tablet by mouth daily. 60 tablet 3  . feeding supplement (BOOST HIGH PROTEIN) LIQD Take 1 Container by mouth daily.    . ferrous sulfate (FERROUSUL) 325 (65 FE) MG tablet Take 1 tablet (325 mg total) by mouth daily with breakfast. 90 tablet 3  .  gabapentin (NEURONTIN) 300 MG capsule One pill in am; and 2 pills at night prior to sleep. 90 capsule 3  . lidocaine-prilocaine (EMLA) cream Apply 1 application topically as needed. Apply generously over the Mediport 45 minutes prior to chemotherapy. 30 g 6  . Multiple Vitamins-Minerals (MULTIVITAMIN ADULT PO) Take 1 tablet by mouth daily.    . ondansetron (ZOFRAN) 8 MG tablet Take 1 tablet (8 mg total) by mouth every 8 (eight) hours as needed for nausea or vomiting (start 3 days; after chemo). 40 tablet 0  . potassium chloride SA (K-DUR,KLOR-CON) 20 MEQ tablet Take 2 tablets (40 mEq total) by mouth 2 (two) times daily. (Patient taking differently: Take 20 mEq by mouth daily. ) 60 tablet 3  . prochlorperazine (COMPAZINE) 10 MG tablet Take 1 tablet (10 mg total) by mouth every 6 (six) hours as needed for nausea or vomiting. 30 tablet 6  . acetaminophen (TYLENOL) 325 MG tablet Take 2 tablets (650 mg total) by mouth every 6 (six) hours as needed for mild pain (or Fever >/= 101). (Patient not taking: Reported on 09/11/2016)    . DULoxetine (CYMBALTA) 20 MG capsule Take 1 capsule (20 mg total) by mouth daily. 30 capsule 3  . HYDROcodone-acetaminophen (NORCO/VICODIN) 5-325 MG tablet Take 1 tablet by mouth every 8 (eight) hours as needed for moderate pain. 40 tablet 0   No current facility-administered medications for this visit.    Facility-Administered Medications Ordered in Other Visits  Medication Dose Route Frequency Provider Last Rate Last Dose  . fluorouracil (ADRUCIL) 3,350 mg in sodium chloride 0.9 % 83 mL chemo infusion  2,400 mg/m2 (Treatment Plan Recorded) Intravenous 1 day or 1 dose Cammie Sickle, MD   3,350 mg at 09/11/16 1106  . sodium chloride flush (NS) 0.9 % injection 10 mL  10 mL Intravenous PRN Cammie Sickle, MD   10 mL at 10/04/15 0901  . sodium chloride flush (NS) 0.9 % injection 10 mL  10 mL Intracatheter PRN Cammie Sickle, MD   10 mL at 09/11/16 0930       .  PHYSICAL EXAMINATION: ECOG PERFORMANCE STATUS: 0 - Asymptomatic  Vitals:   09/11/16 0930  BP: 120/81  Pulse: 78  Resp: 20  Temp: 97.8 F (36.6 C)   Filed Weights   09/11/16 0930  Weight: 108 lb 14.4 oz (49.4 kg)    GENERAL: Well-nourished well-developed; thin built; Alert, no distress and comfortable.   Accompanied by her mother.  EYES: no pallor or icterus OROPHARYNX: no thrush or ulceration; good dentition  NECK: supple, no masses felt LYMPH:  no palpable lymphadenopathy in the cervical, axillary or inguinal regions LUNGS: clear to auscultation and  No wheeze or crackles HEART/CVS: regular rate & rhythm and no murmurs; No  lower extremity edema ABDOMEN: abdomen soft, non-tender and normal bowel sounds; surgical incisions noted. No infections. Musculoskeletal:no cyanosis of digits and no clubbing  PSYCH: alert & oriented x 3 with fluent speech NEURO: no focal motor/sensory deficits SKIN:  no rashes or significant lesions  LABORATORY DATA:  I have reviewed the data as listed Lab Results  Component Value Date   WBC 6.2 09/11/2016   HGB 13.0 09/11/2016   HCT 38.4 09/11/2016   MCV 93.8 09/11/2016   PLT 131 (L) 09/11/2016    Recent Labs  07/31/16 0932 08/21/16 0841 09/11/16 0913  NA 138 139 138  K 3.4* 3.8 3.5  CL 105 109 106  CO2 25 24 25   GLUCOSE 107* 89 99  BUN 20 15 12   CREATININE 0.90 0.78 0.83  CALCIUM 8.8* 9.0 8.6*  GFRNONAA >60 >60 >60  GFRAA >60 >60 >60  PROT 7.2 7.1 6.8  ALBUMIN 3.9 3.8 3.7  AST 24 18 21   ALT 12* 14 14  ALKPHOS 80 68 90  BILITOT 0.4 0.4 0.4   IMPRESSION: 1. Increasing area of mixed lucency and sclerosis in the left side of the L2 vertebral body, concerning for a new metastatic lesion. Further evaluation with bone scan is recommended. 2. No other definite sites of metastatic disease noted elsewhere in the chest, abdomen or pelvis. 3. Previously noted tiny pulmonary nodules near the apex of the left upper lobe are stable  compared to prior studies, favored to be benign. 4. Aortic atherosclerosis, in addition to right coronary artery disease. Please note that although the presence of coronary artery calcium documents the presence of coronary artery disease, the severity of this disease and any potential stenosis cannot be assessed on this non-gated CT examination. Assessment for potential risk factor modification, dietary therapy or pharmacologic therapy may be warranted, if clinically indicated. 5. Additional incidental findings, as above. These results will be called to the ordering clinician or representative by the Radiologist Assistant, and communication documented in the PACS or zVision Dashboard.   Electronically Signed   By: Vinnie Langton M.D.   On: 07/28/2016 11:06 -------------------------------------------------------------------------------------------------------------- IMPRESSION: Focal area of increased activity noted over the left portion of the L2 vertebral body. This corresponds to abnormal vertebral body noted on recent CT. Although these changes may be degenerative, a process such as metastatic disease cannot be excluded. Degenerative changes noted elsewhere. No other focal abnormalities noted to suggest metastatic disease .   Electronically Signed   By: Marcello Moores  Register   On: 08/16/2016 14:19   ASSESSMENT & PLAN:   .Cancer of ascending colon metastatic to intra-abdominal lymph node ([place]) # Colon cancer- cecal/ right-sided; STAGE IV [intraabdominal/retroperitonel LN/Supracla; K-ras mutated]. Currently on 5FU palliative chemotherapy+ avastin. MARCH 9th th CT- Improved/CR except for LUL 4 mm lung nodules; L2- new lytic/sclerotic lesion. Bone scan-met vs degenerative [see discussion below]  # Clinically no evidence of progression tolerating treatments fairly well- however worsening low back pain. Labs today reviewed;  acceptable for treatment today.  proceed with 5FU+  Avastin today.   # Thrombocytopenia platelets 131 today/- G-1. 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   # Bone lesion L-2/symptomatic- recommend x-geva; recommend ca+ vit D BID.  hydrocodone prn; also refer to RT.   #  Follow-up with me  In 3 weeks with chemotherapy Avastin; X-geva; UA.     GCammie Sickle MD 09/11/2016 1:40 PM

## 2016-09-11 NOTE — Progress Notes (Signed)
Pt requesting narcotics to manage low back pain. "throbbing and Pulling sensation." rates pain 7/10. patient "last took Tylenol yesterday evening". "Would like something stronger than Tylenol"

## 2016-09-11 NOTE — Assessment & Plan Note (Addendum)
#  Colon cancer- cecal/ right-sided; STAGE IV [intraabdominal/retroperitonel LN/Supracla; K-ras mutated]. Currently on 5FU palliative chemotherapy+ avastin. MARCH 9th th CT- Improved/CR except for LUL 4 mm lung nodules; L2- new lytic/sclerotic lesion. Bone scan-met vs degenerative [see discussion below]  # Clinically no evidence of progression tolerating treatments fairly well- however worsening low back pain. Labs today reviewed;  acceptable for treatment today.  proceed with 5FU+ Avastin today.   # Thrombocytopenia platelets 131 today/- G-1. 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   # Bone lesion L-2/symptomatic- recommend x-geva; recommend ca+ vit D BID.  hydrocodone prn; also refer to RT.   #  Follow-up with me  In 3 weeks with chemotherapy Avastin; X-geva; UA.

## 2016-09-13 ENCOUNTER — Inpatient Hospital Stay: Payer: BLUE CROSS/BLUE SHIELD

## 2016-09-13 VITALS — BP 118/75 | HR 93 | Temp 97.5°F | Resp 18

## 2016-09-13 DIAGNOSIS — C182 Malignant neoplasm of ascending colon: Secondary | ICD-10-CM

## 2016-09-13 DIAGNOSIS — C772 Secondary and unspecified malignant neoplasm of intra-abdominal lymph nodes: Principal | ICD-10-CM

## 2016-09-13 MED ORDER — SODIUM CHLORIDE 0.9% FLUSH
10.0000 mL | INTRAVENOUS | Status: DC | PRN
  Administered 2016-09-13: 10 mL
  Filled 2016-09-13: qty 10

## 2016-09-13 MED ORDER — HEPARIN SOD (PORK) LOCK FLUSH 100 UNIT/ML IV SOLN
INTRAVENOUS | Status: AC
  Filled 2016-09-13: qty 5

## 2016-09-13 MED ORDER — HEPARIN SOD (PORK) LOCK FLUSH 100 UNIT/ML IV SOLN
500.0000 [IU] | Freq: Once | INTRAVENOUS | Status: AC | PRN
  Administered 2016-09-13: 500 [IU]

## 2016-09-14 ENCOUNTER — Encounter: Payer: Self-pay | Admitting: Radiation Oncology

## 2016-09-14 ENCOUNTER — Ambulatory Visit
Admission: RE | Admit: 2016-09-14 | Discharge: 2016-09-14 | Disposition: A | Discharge status: Home / Self Care | Payer: BLUE CROSS/BLUE SHIELD | Source: Ambulatory Visit | Attending: Radiation Oncology | Admitting: Radiation Oncology

## 2016-09-14 VITALS — BP 120/81 | HR 86 | Temp 96.7°F | Resp 18 | Wt 109.2 lb

## 2016-09-14 DIAGNOSIS — I10 Essential (primary) hypertension: Secondary | ICD-10-CM | POA: Diagnosis not present

## 2016-09-14 DIAGNOSIS — Z510 Encounter for antineoplastic radiation therapy: Secondary | ICD-10-CM | POA: Insufficient documentation

## 2016-09-14 DIAGNOSIS — C189 Malignant neoplasm of colon, unspecified: Secondary | ICD-10-CM | POA: Diagnosis not present

## 2016-09-14 DIAGNOSIS — R599 Enlarged lymph nodes, unspecified: Secondary | ICD-10-CM | POA: Diagnosis not present

## 2016-09-14 DIAGNOSIS — C7931 Secondary malignant neoplasm of brain: Secondary | ICD-10-CM | POA: Insufficient documentation

## 2016-09-14 DIAGNOSIS — Z8041 Family history of malignant neoplasm of ovary: Secondary | ICD-10-CM | POA: Diagnosis not present

## 2016-09-14 DIAGNOSIS — G620 Drug-induced polyneuropathy: Secondary | ICD-10-CM | POA: Insufficient documentation

## 2016-09-14 DIAGNOSIS — Z87891 Personal history of nicotine dependence: Secondary | ICD-10-CM | POA: Insufficient documentation

## 2016-09-14 DIAGNOSIS — Z9049 Acquired absence of other specified parts of digestive tract: Secondary | ICD-10-CM | POA: Diagnosis not present

## 2016-09-14 DIAGNOSIS — C778 Secondary and unspecified malignant neoplasm of lymph nodes of multiple regions: Secondary | ICD-10-CM | POA: Diagnosis not present

## 2016-09-14 DIAGNOSIS — M549 Dorsalgia, unspecified: Secondary | ICD-10-CM | POA: Insufficient documentation

## 2016-09-14 DIAGNOSIS — T451X5S Adverse effect of antineoplastic and immunosuppressive drugs, sequela: Secondary | ICD-10-CM | POA: Diagnosis not present

## 2016-09-14 DIAGNOSIS — E876 Hypokalemia: Secondary | ICD-10-CM | POA: Diagnosis not present

## 2016-09-14 DIAGNOSIS — Z800 Family history of malignant neoplasm of digestive organs: Secondary | ICD-10-CM | POA: Insufficient documentation

## 2016-09-14 DIAGNOSIS — Z79899 Other long term (current) drug therapy: Secondary | ICD-10-CM | POA: Insufficient documentation

## 2016-09-14 DIAGNOSIS — C7951 Secondary malignant neoplasm of bone: Secondary | ICD-10-CM

## 2016-09-14 NOTE — Consult Note (Signed)
NEW PATIENT EVALUATION  Name: Nancy Clark  MRN: [id number]  Date:   09/14/2016     DOB: 09-22-1960   This 56 y.o. female patient presents to the clinic for initial evaluation of stage IV colon cancer with metastatic disease to L2.  REFERRING PHYSICIAN: Coral Spikes, DO  CHIEF COMPLAINT:  Chief Complaint  Patient presents with  . Cancer    Pt is here for initial consultation of bone metastasis    DIAGNOSIS: The encounter diagnosis was Bone metastasis ([place]).   PREVIOUS INVESTIGATIONS:  CT scan and bone scan reviewed Pathology report reviewed Clinical notes reviewed  HPI: Patient is a 56 year old female diagnosed in 2017 with stage IV colon cancer status post right hemicolectomy for T4 N2 adenocarcinoma the colon. PET CT scan showed mesenteric adenopathy mediastinal adenopathy right supraclavicular adenopathy all hypermetabolic consistent with metastatic disease. She has been treated with FOLFOX and Avastin. She's been complaining of increased mid to lower back pain bone scan is positive the L2 vertebral body confirmed on CT scan showing increased mixed lucency and sclerosis in the left side of the L2 vertebral body concerning for metastatic disease. She continues to ambulate without assistance although back pain is increasing in his narcotic dependent at this time. I been asked to evaluate her for possible palliative radiation therapy to that area.  PLANNED TREATMENT REGIMEN: Ration therapy to L2  PAST MEDICAL HISTORY:  has a past medical history of Chicken pox; Colon cancer ([place]); Hypertension; Hypokalemia; Menopause; and Peripheral neuropathy due to chemotherapy ([place]).    PAST SURGICAL HISTORY:  Past Surgical History:  Procedure Laterality Date  . BREAST BIOPSY Right    2007? pt unsure done by [facility]  . COLONOSCOPY    . COLONOSCOPY WITH PROPOFOL N/A 10/14/2015   Procedure: COLONOSCOPY WITH PROPOFOL;  Surgeon: Hulen Luster, MD;  [facility] ENDOSCOPY;  Service: Endoscopy;   Laterality: N/A;  . COLOSTOMY REVISION Right 08/09/2015   Procedure: COLON RESECTION RIGHT;  Surgeon: Florene Glen, MD;  Location: [facility] ORS;  Service: General;  Laterality: Right;  . ECTOPIC PREGNANCY SURGERY    . PORTACATH PLACEMENT N/A 09/01/2015   Procedure: INSERTION PORT-A-CATH;  Surgeon: Florene Glen, MD;  Location: [facility] ORS;  Service: General;  Laterality: N/A;  . SHOULDER SURGERY Left     FAMILY HISTORY: family history includes Arthritis in her father; Colon cancer in her maternal grandfather; Diabetes in her maternal grandmother; Hypertension in her mother; Prostate cancer in her father.  SOCIAL HISTORY:  reports that she has quit smoking. She has a 0.50 pack-year smoking history. She has never used smokeless tobacco. She reports that she does not drink alcohol or use drugs.  ALLERGIES: Patient has no known allergies.  MEDICATIONS:  Current Outpatient Prescriptions  Medication Sig Dispense Refill  . benazepril-hydrochlorthiazide (LOTENSIN HCT) 20-12.5 MG tablet Take 1 tablet by mouth daily. 60 tablet 3  . DULoxetine (CYMBALTA) 20 MG capsule Take 1 capsule (20 mg total) by mouth daily. 30 capsule 3  . feeding supplement (BOOST HIGH PROTEIN) LIQD Take 1 Container by mouth daily.    . ferrous sulfate (FERROUSUL) 325 (65 FE) MG tablet Take 1 tablet (325 mg total) by mouth daily with breakfast. 90 tablet 3  . gabapentin (NEURONTIN) 300 MG capsule One pill in am; and 2 pills at night prior to sleep. 90 capsule 3  . HYDROcodone-acetaminophen (NORCO/VICODIN) 5-325 MG tablet Take 1 tablet by mouth every 8 (eight) hours as needed for moderate pain. 40 tablet 0  .  lidocaine-prilocaine (EMLA) cream Apply 1 application topically as needed. Apply generously over the Mediport 45 minutes prior to chemotherapy. 30 g 6  . Multiple Vitamins-Minerals (MULTIVITAMIN ADULT PO) Take 1 tablet by mouth daily.    . ondansetron (ZOFRAN) 8 MG tablet Take 1 tablet (8 mg total) by mouth every 8 (eight)  hours as needed for nausea or vomiting (start 3 days; after chemo). 40 tablet 0  . potassium chloride SA (K-DUR,KLOR-CON) 20 MEQ tablet Take 2 tablets (40 mEq total) by mouth 2 (two) times daily. (Patient taking differently: Take 20 mEq by mouth daily. ) 60 tablet 3  . prochlorperazine (COMPAZINE) 10 MG tablet Take 1 tablet (10 mg total) by mouth every 6 (six) hours as needed for nausea or vomiting. 30 tablet 6  . acetaminophen (TYLENOL) 325 MG tablet Take 2 tablets (650 mg total) by mouth every 6 (six) hours as needed for mild pain (or Fever >/= 101). (Patient not taking: Reported on 09/11/2016)     No current facility-administered medications for this encounter.    Facility-Administered Medications Ordered in Other Encounters  Medication Dose Route Frequency Provider Last Rate Last Dose  . sodium chloride flush (NS) 0.9 % injection 10 mL  10 mL Intravenous PRN Cammie Sickle, MD   10 mL at 10/04/15 0901    ECOG PERFORMANCE STATUS:  1 - Symptomatic but completely ambulatory  REVIEW OF SYSTEMS: Except for the narcotic dependent pain Patient denies any weight loss, fatigue, weakness, fever, chills or night sweats. Patient denies any loss of vision, blurred vision. Patient denies any ringing  of the ears or hearing loss. No irregular heartbeat. Patient denies heart murmur or history of fainting. Patient denies any chest pain or pain radiating to her upper extremities. Patient denies any shortness of breath, difficulty breathing at night, cough or hemoptysis. Patient denies any swelling in the lower legs. Patient denies any nausea vomiting, vomiting of blood, or coffee ground material in the vomitus. Patient denies any stomach pain. Patient states has had normal bowel movements no significant constipation or diarrhea. Patient denies any dysuria, hematuria or significant nocturia. Patient denies any problems walking, swelling in the joints or loss of balance. Patient denies any skin changes, loss of  hair or loss of weight. Patient denies any excessive worrying or anxiety or significant depression. Patient denies any problems with insomnia. Patient denies excessive thirst, polyuria, polydipsia. Patient denies any swollen glands, patient denies easy bruising or easy bleeding. Patient denies any recent infections, allergies or URI. Patient "s visual fields have not changed significantly in recent time.    PHYSICAL EXAM: BP 120/81   Pulse 86   Temp (!) 96.7 F (35.9 C)   Resp 18   Wt 109 lb 3.8 oz (49.6 kg)   LMP  (LMP Unknown) Comment: LMP MORE THAN 5 YRS  BMI 21.33 kg/m  Motor sensory and DTR levels are equal and symmetric in the lower extremities proprioception is intact range of motion of her lower extremities does not elicit pain. She does have pain to deep palpation of her lumbar spine. Well-developed well-nourished patient in NAD. HEENT reveals PERLA, EOMI, discs not visualized.  Oral cavity is clear. No oral mucosal lesions are identified. Neck is clear without evidence of cervical or supraclavicular adenopathy. Lungs are clear to A&P. Cardiac examination is essentially unremarkable with regular rate and rhythm without murmur rub or thrill. Abdomen is benign with no organomegaly or masses noted. Motor sensory and DTR levels are equal and symmetric in  the upper and lower extremities. Cranial nerves II through XII are grossly intact. Proprioception is intact. No peripheral adenopathy or edema is identified. No motor or sensory levels are noted. Crude visual fields are within normal range.  LABORATORY DATA: Pathology reports reviewed    RADIOLOGY RESULTS: Bone scan and CT scans reviewed   IMPRESSION: Metastatic colon cancer with involvement of L2 causing significant narcotic dependent pain in 56 year old female  PLAN: Present time I to go ahead with palliative radiation therapy. I would treat L1-L3 inclusive to 3000 cGy in 10 fractions for palliation. Risks and benefits of treatment  including fatigue possible diarrhea alteration of blood counts skin reaction all were described in detail to the patient.There will be extra effort by both professional staff as well as technical staff to coordinate and manage concurrent chemoradiation and ensuing side effects during her treatments. I first set up and ordered CT simulation for early next week. Patient seems to comprehend my treatment plan well.  I would like to take this opportunity to thank you for allowing me to participate in the care of your patient.Armstead Peaks., MD

## 2016-09-18 ENCOUNTER — Ambulatory Visit
Admission: RE | Admit: 2016-09-18 | Discharge: 2016-09-18 | Disposition: A | Discharge status: Home / Self Care | Payer: BLUE CROSS/BLUE SHIELD | Source: Ambulatory Visit | Attending: Radiation Oncology | Admitting: Radiation Oncology

## 2016-09-18 DIAGNOSIS — C7931 Secondary malignant neoplasm of brain: Secondary | ICD-10-CM | POA: Diagnosis not present

## 2016-09-19 DIAGNOSIS — C7931 Secondary malignant neoplasm of brain: Secondary | ICD-10-CM | POA: Diagnosis not present

## 2016-09-21 ENCOUNTER — Telehealth: Payer: Self-pay | Admitting: *Deleted

## 2016-09-21 DIAGNOSIS — R112 Nausea with vomiting, unspecified: Secondary | ICD-10-CM

## 2016-09-21 DIAGNOSIS — C772 Secondary and unspecified malignant neoplasm of intra-abdominal lymph nodes: Principal | ICD-10-CM

## 2016-09-21 DIAGNOSIS — T451X5A Adverse effect of antineoplastic and immunosuppressive drugs, initial encounter: Secondary | ICD-10-CM

## 2016-09-21 DIAGNOSIS — C182 Malignant neoplasm of ascending colon: Secondary | ICD-10-CM

## 2016-09-21 MED ORDER — PROCHLORPERAZINE MALEATE 10 MG PO TABS
10.0000 mg | ORAL_TABLET | Freq: Four times a day (QID) | ORAL | 1 refills | Status: DC | PRN
Start: 2016-09-21 — End: 2017-12-17

## 2016-09-21 NOTE — Telephone Encounter (Signed)
Needeed Compazine to go to Mail order, [facility]

## 2016-09-22 ENCOUNTER — Ambulatory Visit
Admission: RE | Admit: 2016-09-22 | Discharge: 2016-09-22 | Disposition: A | Discharge status: Home / Self Care | Payer: BLUE CROSS/BLUE SHIELD | Source: Ambulatory Visit | Attending: Radiation Oncology | Admitting: Radiation Oncology

## 2016-09-22 DIAGNOSIS — C7931 Secondary malignant neoplasm of brain: Secondary | ICD-10-CM | POA: Diagnosis not present

## 2016-09-25 ENCOUNTER — Ambulatory Visit
Admission: RE | Admit: 2016-09-25 | Discharge: 2016-09-25 | Disposition: A | Discharge status: Home / Self Care | Payer: BLUE CROSS/BLUE SHIELD | Source: Ambulatory Visit | Attending: Radiation Oncology | Admitting: Radiation Oncology

## 2016-09-25 DIAGNOSIS — C7931 Secondary malignant neoplasm of brain: Secondary | ICD-10-CM | POA: Diagnosis not present

## 2016-09-26 ENCOUNTER — Ambulatory Visit
Admission: RE | Admit: 2016-09-26 | Discharge: 2016-09-26 | Disposition: A | Discharge status: Home / Self Care | Payer: BLUE CROSS/BLUE SHIELD | Source: Ambulatory Visit | Attending: Radiation Oncology | Admitting: Radiation Oncology

## 2016-09-26 DIAGNOSIS — C7931 Secondary malignant neoplasm of brain: Secondary | ICD-10-CM | POA: Diagnosis not present

## 2016-09-27 ENCOUNTER — Ambulatory Visit
Admission: RE | Admit: 2016-09-27 | Discharge: 2016-09-27 | Disposition: A | Discharge status: Home / Self Care | Payer: BLUE CROSS/BLUE SHIELD | Source: Ambulatory Visit | Attending: Radiation Oncology | Admitting: Radiation Oncology

## 2016-09-27 DIAGNOSIS — C7931 Secondary malignant neoplasm of brain: Secondary | ICD-10-CM | POA: Diagnosis not present

## 2016-09-28 ENCOUNTER — Ambulatory Visit
Admission: RE | Admit: 2016-09-28 | Discharge: 2016-09-28 | Disposition: A | Discharge status: Home / Self Care | Payer: BLUE CROSS/BLUE SHIELD | Source: Ambulatory Visit | Attending: Radiation Oncology | Admitting: Radiation Oncology

## 2016-09-28 DIAGNOSIS — C7931 Secondary malignant neoplasm of brain: Secondary | ICD-10-CM | POA: Diagnosis not present

## 2016-09-29 ENCOUNTER — Ambulatory Visit
Admission: RE | Admit: 2016-09-29 | Discharge: 2016-09-29 | Disposition: A | Discharge status: Home / Self Care | Payer: BLUE CROSS/BLUE SHIELD | Source: Ambulatory Visit | Attending: Radiation Oncology | Admitting: Radiation Oncology

## 2016-09-29 DIAGNOSIS — C7931 Secondary malignant neoplasm of brain: Secondary | ICD-10-CM | POA: Diagnosis not present

## 2016-10-02 ENCOUNTER — Inpatient Hospital Stay (HOSPITAL_BASED_OUTPATIENT_CLINIC_OR_DEPARTMENT_OTHER): Payer: BLUE CROSS/BLUE SHIELD | Admitting: Internal Medicine

## 2016-10-02 ENCOUNTER — Inpatient Hospital Stay: Payer: BLUE CROSS/BLUE SHIELD | Attending: Internal Medicine

## 2016-10-02 ENCOUNTER — Inpatient Hospital Stay: Payer: BLUE CROSS/BLUE SHIELD

## 2016-10-02 ENCOUNTER — Ambulatory Visit
Admission: RE | Admit: 2016-10-02 | Discharge: 2016-10-02 | Disposition: A | Discharge status: Home / Self Care | Payer: BLUE CROSS/BLUE SHIELD | Source: Ambulatory Visit | Attending: Radiation Oncology | Admitting: Radiation Oncology

## 2016-10-02 VITALS — BP 113/77 | HR 84 | Temp 97.8°F | Resp 18

## 2016-10-02 DIAGNOSIS — Z79899 Other long term (current) drug therapy: Secondary | ICD-10-CM | POA: Diagnosis not present

## 2016-10-02 DIAGNOSIS — D696 Thrombocytopenia, unspecified: Secondary | ICD-10-CM

## 2016-10-02 DIAGNOSIS — G893 Neoplasm related pain (acute) (chronic): Secondary | ICD-10-CM

## 2016-10-02 DIAGNOSIS — Z5112 Encounter for antineoplastic immunotherapy: Secondary | ICD-10-CM | POA: Diagnosis not present

## 2016-10-02 DIAGNOSIS — Z923 Personal history of irradiation: Secondary | ICD-10-CM | POA: Diagnosis not present

## 2016-10-02 DIAGNOSIS — C7951 Secondary malignant neoplasm of bone: Secondary | ICD-10-CM | POA: Diagnosis not present

## 2016-10-02 DIAGNOSIS — Z9049 Acquired absence of other specified parts of digestive tract: Secondary | ICD-10-CM | POA: Insufficient documentation

## 2016-10-02 DIAGNOSIS — C772 Secondary and unspecified malignant neoplasm of intra-abdominal lymph nodes: Secondary | ICD-10-CM

## 2016-10-02 DIAGNOSIS — Z95828 Presence of other vascular implants and grafts: Secondary | ICD-10-CM

## 2016-10-02 DIAGNOSIS — Z87891 Personal history of nicotine dependence: Secondary | ICD-10-CM | POA: Diagnosis not present

## 2016-10-02 DIAGNOSIS — I2510 Atherosclerotic heart disease of native coronary artery without angina pectoris: Secondary | ICD-10-CM | POA: Diagnosis not present

## 2016-10-02 DIAGNOSIS — C182 Malignant neoplasm of ascending colon: Secondary | ICD-10-CM | POA: Insufficient documentation

## 2016-10-02 DIAGNOSIS — E876 Hypokalemia: Secondary | ICD-10-CM | POA: Diagnosis not present

## 2016-10-02 DIAGNOSIS — I700 Atherosclerosis of aorta: Secondary | ICD-10-CM | POA: Diagnosis not present

## 2016-10-02 DIAGNOSIS — I10 Essential (primary) hypertension: Secondary | ICD-10-CM

## 2016-10-02 DIAGNOSIS — Z800 Family history of malignant neoplasm of digestive organs: Secondary | ICD-10-CM | POA: Insufficient documentation

## 2016-10-02 DIAGNOSIS — M549 Dorsalgia, unspecified: Secondary | ICD-10-CM

## 2016-10-02 DIAGNOSIS — C7931 Secondary malignant neoplasm of brain: Secondary | ICD-10-CM | POA: Diagnosis not present

## 2016-10-02 LAB — COMPREHENSIVE METABOLIC PANEL
ALK PHOS: 83 U/L (ref 38–126)
ALT: 16 U/L (ref 14–54)
AST: 22 U/L (ref 15–41)
Albumin: 3.9 g/dL (ref 3.5–5.0)
Anion gap: 7 (ref 5–15)
BILIRUBIN TOTAL: 0.5 mg/dL (ref 0.3–1.2)
BUN: 17 mg/dL (ref 6–20)
CALCIUM: 8.6 mg/dL — AB (ref 8.9–10.3)
CO2: 26 mmol/L (ref 22–32)
CREATININE: 0.79 mg/dL (ref 0.44–1.00)
Chloride: 104 mmol/L (ref 101–111)
Glucose, Bld: 95 mg/dL (ref 65–99)
Potassium: 3.5 mmol/L (ref 3.5–5.1)
Sodium: 137 mmol/L (ref 135–145)
TOTAL PROTEIN: 7.2 g/dL (ref 6.5–8.1)

## 2016-10-02 LAB — CBC WITH DIFFERENTIAL/PLATELET
Basophils Absolute: 0 10*3/uL (ref 0–0.1)
Basophils Relative: 1 %
Eosinophils Absolute: 0.1 10*3/uL (ref 0–0.7)
Eosinophils Relative: 2 %
HEMATOCRIT: 39.3 % (ref 35.0–47.0)
HEMOGLOBIN: 13.5 g/dL (ref 12.0–16.0)
LYMPHS ABS: 1.2 10*3/uL (ref 1.0–3.6)
LYMPHS PCT: 26 %
MCH: 32 pg (ref 26.0–34.0)
MCHC: 34.3 g/dL (ref 32.0–36.0)
MCV: 93.3 fL (ref 80.0–100.0)
MONOS PCT: 12 %
Monocytes Absolute: 0.6 10*3/uL (ref 0.2–0.9)
NEUTROS PCT: 59 %
Neutro Abs: 2.8 10*3/uL (ref 1.4–6.5)
Platelets: 134 10*3/uL — ABNORMAL LOW (ref 150–440)
RBC: 4.21 MIL/uL (ref 3.80–5.20)
RDW: 16.3 % — ABNORMAL HIGH (ref 11.5–14.5)
WBC: 4.7 10*3/uL (ref 3.6–11.0)

## 2016-10-02 LAB — URINALYSIS, COMPLETE (UACMP) WITH MICROSCOPIC
BILIRUBIN URINE: NEGATIVE
Bacteria, UA: NONE SEEN
Glucose, UA: NEGATIVE mg/dL
Hgb urine dipstick: NEGATIVE
KETONES UR: NEGATIVE mg/dL
Leukocytes, UA: NEGATIVE
Nitrite: NEGATIVE
PH: 5 (ref 5.0–8.0)
PROTEIN: 30 mg/dL — AB
Specific Gravity, Urine: 1.015 (ref 1.005–1.030)

## 2016-10-02 MED ORDER — SODIUM CHLORIDE 0.9 % IV SOLN
5.0000 mg/kg | Freq: Once | INTRAVENOUS | Status: AC
  Administered 2016-10-02: 225 mg via INTRAVENOUS
  Filled 2016-10-02: qty 9

## 2016-10-02 MED ORDER — LIDOCAINE-PRILOCAINE 2.5-2.5 % EX CREA: 1.0000 "application " | TOPICAL_CREAM | CUTANEOUS | 6 refills | Status: DC | PRN

## 2016-10-02 MED ORDER — SODIUM CHLORIDE 0.9 % IV SOLN
2400.0000 mg/m2 | INTRAVENOUS | Status: DC
  Administered 2016-10-02: 3350 mg via INTRAVENOUS
  Filled 2016-10-02: qty 67

## 2016-10-02 MED ORDER — DEXAMETHASONE SODIUM PHOSPHATE 10 MG/ML IJ SOLN
10.0000 mg | Freq: Once | INTRAMUSCULAR | Status: AC
  Administered 2016-10-02: 10 mg via INTRAVENOUS
  Filled 2016-10-02: qty 1

## 2016-10-02 MED ORDER — LEUCOVORIN CALCIUM INJECTION 100 MG
20.0000 mg/m2 | Freq: Once | INTRAMUSCULAR | Status: AC
  Administered 2016-10-02: 28 mg via INTRAVENOUS
  Filled 2016-10-02: qty 1.4

## 2016-10-02 MED ORDER — HEPARIN SOD (PORK) LOCK FLUSH 100 UNIT/ML IV SOLN
500.0000 [IU] | Freq: Once | INTRAVENOUS | Status: DC
Start: 2016-10-02 — End: 2016-10-02

## 2016-10-02 MED ORDER — SODIUM CHLORIDE 0.9% FLUSH
10.0000 mL | INTRAVENOUS | Status: DC | PRN
  Administered 2016-10-02: 10 mL via INTRAVENOUS
  Filled 2016-10-02: qty 10

## 2016-10-02 MED ORDER — DEXTROSE 5 % IV SOLN: 400.0000 mg/m2 | Freq: Once | INTRAVENOUS | Status: DC

## 2016-10-02 MED ORDER — SODIUM CHLORIDE 0.9 % IV SOLN
Freq: Once | INTRAVENOUS | Status: AC
  Administered 2016-10-02: 10:00:00 via INTRAVENOUS
  Filled 2016-10-02: qty 1000

## 2016-10-02 MED ORDER — ONDANSETRON 8 MG PO TBDP
8.0000 mg | ORAL_TABLET | Freq: Once | ORAL | Status: AC
  Administered 2016-10-02: 8 mg via ORAL
  Filled 2016-10-02: qty 1

## 2016-10-02 NOTE — Assessment & Plan Note (Addendum)
#  Colon cancer- cecal/ right-sided; STAGE IV [intraabdominal/retroperitonel LN/Supracla; K-ras mutated]. Currently on 5FU palliative chemotherapy+ avastin every 3 w. MARCH 9th th CT- Improved/CR except for LUL 4 mm lung nodules; L2- new lytic/sclerotic lesion. April 2018- Bone scan-met vs degenerative [see discussion below]  # Clinically no evidence of progression tolerating treatments fairly well [on RT to L2- see discussion below]Labs today reviewed;  acceptable for treatment today.  proceed with 5FU+ Avastin today.   # Thrombocytopenia platelets 131 today/- G-1. 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   # Bone lesion L-2/symptomatic-  x-geva [4/23 last]; on ca+ vit D BID. Improvement in pain continue hydrocodone prn; on  RT [until 05/18]   #  Follow-up with me  In 3 weeks with chemotherapy Avastin; X-geva; UA [Covering MD]; 6 weeks.

## 2016-10-02 NOTE — Progress Notes (Signed)
[facility] CONSULT NOTE  Patient Care Team: Coral Spikes, DO as PCP - General (Family Medicine)  CHIEF COMPLAINTS/PURPOSE OF CONSULTATION:   Oncology History   # [facility] 2017- COLON CANCER- STAGE IV [s/p right hemi-colectomy; pT4apN2 (7/19LN)]; Pre-op CEA- [id number]  PET-  mesenteric adenopathy/mediastinal adenopathy/right-sided subclavicular lymph node.    April 17th -START FOLFOX; May 1st Add Avastin;   Aug 16th- Disc [name] [facility] [place] PN]; con 5FU-Avastin; NOV 27th CT C/A/P- CR; except for 45m LUL; 5FU-Avastin q 3 W  # March 2018- L2 uptake MET s/p RT [[facility]2018]; mid April X-geva   # MOLECULAR TESTING: K-ras-EXON 2- MUTATED; MSI- STABLE.      Cancer of ascending colon metastatic to intra-abdominal lymph node [place]   09/06/2015 Initial Diagnosis    Cancer of ascending colon metastatic to intra-abdominal lymph node ([facility])        HISTORY OF PRESENTING ILLNESS:  Nancy SCHREURS89y.o.  female  with a diagnosed Metastatic right-sided colon cancer status post 5-FU plus Avastin; Is here for follow-up. Patient is currently getting radiation to the L2 area as she has been getting symptomatic.  She is noted improvement in her back pain since radiation. She is taking pain medication only as needed. Denies any new onset of tingling and numbness in the hand and feet. However the tingling and numbness in her feet is not any better. She is on Neurontin/Cymbalta.   No diarrhea no headaches. No epistaxis. No swelling in the legs. Appetite is good. Continued weight gain.   ROS: A complete 10 point review of system is done which is negative except mentioned above in history of present illness  MEDICAL HISTORY:  Past Medical History:  Diagnosis Date  . Chicken pox   . [place] cancer [place]    Partial colectomy 07/2015 + chemo tx's  . Hypertension   . Hypokalemia   . Menopause    > 5 yrs  . Peripheral neuropathy due to chemotherapy ([facility]     SURGICAL HISTORY: Past Surgical  History:  Procedure Laterality Date  . BREAST BIOPSY Right    2007? pt unsure done by [facility]  . COLONOSCOPY    . COLONOSCOPY WITH PROPOFOL N/A 10/14/2015   Procedure: COLONOSCOPY WITH PROPOFOL;  Surgeon: PHulen Luster MD;  [facility];  Service: Endoscopy;  Laterality: N/A;  . COLOSTOMY REVISION Right 08/09/2015   Procedure: COLON RESECTION RIGHT;  Surgeon: RFlorene Glen MD;  Location: [facility] ORS;  Service: General;  Laterality: Right;  . ECTOPIC PREGNANCY SURGERY    . PORTACATH PLACEMENT N/A 09/01/2015   Procedure: INSERTION PORT-A-CATH;  Surgeon: RFlorene Glen MD;  Location: [facility] ORS;  Service: General;  Laterality: N/A;  . SHOULDER SURGERY Left     SOCIAL HISTORY: [employer] in [place]  1 pack in 3 days; no alcohol. 2 childern [25 & 17 years] Social History   Social History  . Marital status: Married    Spouse name: N/A  . Number of children: N/A  . Years of education: N/A   Occupational History  . Not on file.   Social History Main Topics  . Smoking status: Former Smoker    Packs/day: 0.25    Years: 2.00  . Smokeless tobacco: Never Used     Comment: recently quit  . Alcohol use No  . Drug use: No  . Sexual activity: Yes   Other Topics Concern  . Not on file   Social History Narrative  .  No narrative on file    FAMILY HISTORY: 1 brother& 1 sister- no cancer. Mom-no cancer Family History  Problem Relation Age of Onset  . Hypertension Mother   . Arthritis Father   . Prostate cancer Father   . Diabetes Maternal Grandmother   . Colon cancer Maternal Grandfather        colon    ALLERGIES:  has No Known Allergies.  MEDICATIONS:  Current Outpatient Prescriptions  Medication Sig Dispense Refill  . benazepril-hydrochlorthiazide (LOTENSIN HCT) 20-12.5 MG tablet Take 1 tablet by mouth daily. 60 tablet 3  . DULoxetine (CYMBALTA) 20 MG capsule Take 1 capsule (20 mg total) by mouth daily. 30 capsule 3  . feeding supplement (BOOST HIGH  PROTEIN) LIQD Take 1 Container by mouth daily.    . ferrous sulfate (FERROUSUL) 325 (65 FE) MG tablet Take 1 tablet (325 mg total) by mouth daily with breakfast. 90 tablet 3  . gabapentin (NEURONTIN) 300 MG capsule One pill in am; and 2 pills at night prior to sleep. 90 capsule 3  . HYDROcodone-acetaminophen (NORCO/VICODIN) 5-325 MG tablet Take 1 tablet by mouth every 8 (eight) hours as needed for moderate pain. 40 tablet 0  . lidocaine-prilocaine (EMLA) cream Apply 1 application topically as needed. Apply generously over the Mediport 45 minutes prior to chemotherapy. 30 g 6  . Multiple Vitamins-Minerals (MULTIVITAMIN ADULT PO) Take 1 tablet by mouth daily.    . potassium chloride SA (K-DUR,KLOR-CON) 20 MEQ tablet Take 2 tablets (40 mEq total) by mouth 2 (two) times daily. (Patient taking differently: Take 20 mEq by mouth daily. ) 60 tablet 3  . acetaminophen (TYLENOL) 325 MG tablet Take 2 tablets (650 mg total) by mouth every 6 (six) hours as needed for mild pain (or Fever >/= 101). (Patient not taking: Reported on 09/11/2016)    . ondansetron (ZOFRAN) 8 MG tablet Take 1 tablet (8 mg total) by mouth every 8 (eight) hours as needed for nausea or vomiting (start 3 days; after chemo). (Patient not taking: Reported on 10/02/2016) 40 tablet 0  . prochlorperazine (COMPAZINE) 10 MG tablet Take 1 tablet (10 mg total) by mouth every 6 (six) hours as needed for nausea or vomiting. (Patient not taking: Reported on 10/02/2016) 90 tablet 1   No current facility-administered medications for this visit.    Facility-Administered Medications Ordered in Other Visits  Medication Dose Route Frequency Provider Last Rate Last Dose  . sodium chloride flush (NS) 0.9 % injection 10 mL  10 mL Intravenous PRN Cammie Sickle, MD   10 mL at 10/04/15 0901      .  PHYSICAL EXAMINATION: ECOG PERFORMANCE STATUS: 0 - Asymptomatic  Vitals:   10/02/16 0900  BP: 113/77  Pulse: 84  Resp: 18  Temp: 97.8 F (36.6 C)    There were no vitals filed for this visit.  GENERAL: Well-nourished well-developed; thin built; Alert, no distress and comfortable.   Accompanied by her mother.  EYES: no pallor or icterus OROPHARYNX: no thrush or ulceration; good dentition  NECK: supple, no masses felt LYMPH:  no palpable lymphadenopathy in the cervical, axillary or inguinal regions LUNGS: clear to auscultation and  No wheeze or crackles HEART/CVS: regular rate & rhythm and no murmurs; No lower extremity edema ABDOMEN: abdomen soft, non-tender and normal bowel sounds; No infections. Musculoskeletal:no cyanosis of digits and no clubbing  PSYCH: alert & oriented x 3 with fluent speech NEURO: no focal motor/sensory deficits SKIN:  no rashes or significant lesions  LABORATORY DATA:  I have reviewed the data as listed Lab Results  Component Value Date   WBC 4.7 10/02/2016   HGB 13.5 10/02/2016   HCT 39.3 10/02/2016   MCV 93.3 10/02/2016   PLT 134 (L) 10/02/2016    Recent Labs  08/21/16 0841 09/11/16 0913 10/02/16 0845  NA 139 138 137  K 3.8 3.5 3.5  CL 109 106 104  CO2 _0 GLUCOSE 89 99 95  BUN _1 CREATININE 0.78 0.83 0.79  CALCIUM 9.0 8.6* 8.6*  GFRNONAA >60 >60 >60  GFRAA >60 >60 >60  PROT 7.1 6.8 7.2  ALBUMIN 3.8 3.7 3.9  AST _2 ALT _3 ALKPHOS 68 90 83  BILITOT 0.4 0.4 0.5   IMPRESSION: 1. Increasing area of mixed lucency and sclerosis in the left side of the L2 vertebral body, concerning for a new metastatic lesion. Further evaluation with bone scan is recommended. 2. No other definite sites of metastatic disease noted elsewhere in the chest, abdomen or pelvis. 3. Previously noted tiny pulmonary nodules near the apex of the left upper lobe are stable compared to prior studies, favored to be benign. 4. Aortic atherosclerosis, in addition to right coronary artery disease. Please note that although the presence of coronary artery calcium documents the presence of  coronary artery disease, the severity of this disease and any potential stenosis cannot be assessed on this non-gated CT examination. Assessment for potential risk factor modification, dietary therapy or pharmacologic therapy may be warranted, if clinically indicated. 5. Additional incidental findings, as above. These results will be called to the ordering clinician or representative by the Radiologist Assistant, and communication documented in the PACS or zVision Dashboard.   Electronically Signed   By: Vinnie Langton M.D.   On: 07/28/2016 11:06 -------------------------------------------------------------------------------------------------------------- IMPRESSION: Focal area of increased activity noted over the left portion of the L2 vertebral body. This corresponds to abnormal vertebral body noted on recent CT. Although these changes may be degenerative, a process such as metastatic disease cannot be excluded. Degenerative changes noted elsewhere. No other focal abnormalities noted to suggest metastatic disease .   Electronically Signed   By: Marcello Moores  Register   On: 08/16/2016 14:19   ASSESSMENT & PLAN:   .Cancer of ascending colon metastatic to intra-abdominal lymph node ([place]) # Colon cancer- cecal/ right-sided; STAGE IV [intraabdominal/retroperitonel LN/Supracla; K-ras mutated]. Currently on 5FU palliative chemotherapy+ avastin every 3 w. MARCH 9th th CT- Improved/CR except for LUL 4 mm lung nodules; L2- new lytic/sclerotic lesion. April 2018- Bone scan-met vs degenerative [see discussion below]  # Clinically no evidence of progression tolerating treatments fairly well [on RT to L2- see discussion below]Labs today reviewed;  acceptable for treatment today.  proceed with 5FU+ Avastin today.   # Thrombocytopenia platelets 131 today/- G-1. 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   # Bone lesion  L-2/symptomatic-  x-geva [4/23 last]; on ca+ vit D BID. Improvement in pain continue hydrocodone prn; on  RT [until 05/18]   #  Follow-up with me  In 3 weeks with chemotherapy Avastin; X-geva; UA [Covering MD]; 6 weeks.    GCammie Sickle MD 10/03/2016 8:20 AM

## 2016-10-02 NOTE — Progress Notes (Signed)
Patient here in clinic for colon cancer follow-up. She reports that her emla cream on 4/23 [facility]. [facility] and [facility]. Per patient's request, [facility]. She states that her lower back pain has improved with the use of Norco.

## 2016-10-03 ENCOUNTER — Ambulatory Visit
Admission: RE | Admit: 2016-10-03 | Discharge: 2016-10-03 | Disposition: A | Discharge status: Home / Self Care | Payer: BLUE CROSS/BLUE SHIELD | Source: Ambulatory Visit | Attending: Radiation Oncology | Admitting: Radiation Oncology

## 2016-10-03 DIAGNOSIS — C7931 Secondary malignant neoplasm of brain: Secondary | ICD-10-CM | POA: Diagnosis not present

## 2016-10-04 ENCOUNTER — Ambulatory Visit
Admission: RE | Admit: 2016-10-04 | Discharge: 2016-10-04 | Disposition: A | Discharge status: Home / Self Care | Payer: BLUE CROSS/BLUE SHIELD | Source: Ambulatory Visit | Attending: Radiation Oncology | Admitting: Radiation Oncology

## 2016-10-04 ENCOUNTER — Inpatient Hospital Stay: Payer: BLUE CROSS/BLUE SHIELD

## 2016-10-04 VITALS — BP 112/66 | HR 75 | Temp 96.9°F | Resp 20

## 2016-10-04 DIAGNOSIS — C182 Malignant neoplasm of ascending colon: Secondary | ICD-10-CM | POA: Diagnosis not present

## 2016-10-04 DIAGNOSIS — C772 Secondary and unspecified malignant neoplasm of intra-abdominal lymph nodes: Principal | ICD-10-CM

## 2016-10-04 DIAGNOSIS — C7931 Secondary malignant neoplasm of brain: Secondary | ICD-10-CM | POA: Diagnosis not present

## 2016-10-04 MED ORDER — SODIUM CHLORIDE 0.9% FLUSH
10.0000 mL | INTRAVENOUS | Status: DC | PRN
  Administered 2016-10-04: 10 mL
  Filled 2016-10-04: qty 10

## 2016-10-04 MED ORDER — HEPARIN SOD (PORK) LOCK FLUSH 100 UNIT/ML IV SOLN
500.0000 [IU] | Freq: Once | INTRAVENOUS | Status: AC | PRN
  Administered 2016-10-04: 500 [IU]
  Filled 2016-10-04: qty 5

## 2016-10-05 ENCOUNTER — Ambulatory Visit
Admission: RE | Admit: 2016-10-05 | Discharge: 2016-10-05 | Disposition: A | Discharge status: Home / Self Care | Payer: BLUE CROSS/BLUE SHIELD | Source: Ambulatory Visit | Attending: Radiation Oncology | Admitting: Radiation Oncology

## 2016-10-05 DIAGNOSIS — C7931 Secondary malignant neoplasm of brain: Secondary | ICD-10-CM | POA: Diagnosis not present

## 2016-10-06 ENCOUNTER — Ambulatory Visit
Admission: RE | Admit: 2016-10-06 | Discharge: 2016-10-06 | Disposition: A | Discharge status: Home / Self Care | Payer: BLUE CROSS/BLUE SHIELD | Source: Ambulatory Visit | Attending: Radiation Oncology | Admitting: Radiation Oncology

## 2016-10-06 DIAGNOSIS — C7931 Secondary malignant neoplasm of brain: Secondary | ICD-10-CM | POA: Diagnosis not present

## 2016-10-23 ENCOUNTER — Inpatient Hospital Stay (HOSPITAL_BASED_OUTPATIENT_CLINIC_OR_DEPARTMENT_OTHER): Payer: BLUE CROSS/BLUE SHIELD | Admitting: Oncology

## 2016-10-23 ENCOUNTER — Inpatient Hospital Stay: Payer: BLUE CROSS/BLUE SHIELD | Attending: Oncology

## 2016-10-23 ENCOUNTER — Inpatient Hospital Stay: Payer: BLUE CROSS/BLUE SHIELD

## 2016-10-23 VITALS — BP 107/83 | HR 88 | Temp 97.1°F | Resp 18 | Ht 60.0 in | Wt 107.4 lb

## 2016-10-23 DIAGNOSIS — Z87891 Personal history of nicotine dependence: Secondary | ICD-10-CM

## 2016-10-23 DIAGNOSIS — I700 Atherosclerosis of aorta: Secondary | ICD-10-CM | POA: Insufficient documentation

## 2016-10-23 DIAGNOSIS — R197 Diarrhea, unspecified: Secondary | ICD-10-CM | POA: Insufficient documentation

## 2016-10-23 DIAGNOSIS — Z5112 Encounter for antineoplastic immunotherapy: Secondary | ICD-10-CM | POA: Diagnosis not present

## 2016-10-23 DIAGNOSIS — Z79899 Other long term (current) drug therapy: Secondary | ICD-10-CM

## 2016-10-23 DIAGNOSIS — C772 Secondary and unspecified malignant neoplasm of intra-abdominal lymph nodes: Principal | ICD-10-CM

## 2016-10-23 DIAGNOSIS — E876 Hypokalemia: Secondary | ICD-10-CM | POA: Insufficient documentation

## 2016-10-23 DIAGNOSIS — M549 Dorsalgia, unspecified: Secondary | ICD-10-CM | POA: Diagnosis not present

## 2016-10-23 DIAGNOSIS — I10 Essential (primary) hypertension: Secondary | ICD-10-CM | POA: Diagnosis not present

## 2016-10-23 DIAGNOSIS — C182 Malignant neoplasm of ascending colon: Secondary | ICD-10-CM | POA: Diagnosis not present

## 2016-10-23 DIAGNOSIS — D696 Thrombocytopenia, unspecified: Secondary | ICD-10-CM | POA: Insufficient documentation

## 2016-10-23 DIAGNOSIS — Z923 Personal history of irradiation: Secondary | ICD-10-CM | POA: Insufficient documentation

## 2016-10-23 DIAGNOSIS — Z800 Family history of malignant neoplasm of digestive organs: Secondary | ICD-10-CM | POA: Insufficient documentation

## 2016-10-23 DIAGNOSIS — I2510 Atherosclerotic heart disease of native coronary artery without angina pectoris: Secondary | ICD-10-CM | POA: Insufficient documentation

## 2016-10-23 DIAGNOSIS — C7951 Secondary malignant neoplasm of bone: Secondary | ICD-10-CM

## 2016-10-23 DIAGNOSIS — Z9049 Acquired absence of other specified parts of digestive tract: Secondary | ICD-10-CM

## 2016-10-23 DIAGNOSIS — R918 Other nonspecific abnormal finding of lung field: Secondary | ICD-10-CM | POA: Diagnosis not present

## 2016-10-23 LAB — CBC WITH DIFFERENTIAL/PLATELET
BASOS ABS: 0.1 10*3/uL (ref 0–0.1)
BASOS PCT: 1 %
Eosinophils Absolute: 0.2 10*3/uL (ref 0–0.7)
Eosinophils Relative: 3 %
HCT: 37.2 % (ref 35.0–47.0)
HEMOGLOBIN: 12.9 g/dL (ref 12.0–16.0)
Lymphocytes Relative: 27 %
Lymphs Abs: 1.3 10*3/uL (ref 1.0–3.6)
MCH: 32.1 pg (ref 26.0–34.0)
MCHC: 34.6 g/dL (ref 32.0–36.0)
MCV: 92.7 fL (ref 80.0–100.0)
Monocytes Absolute: 0.7 10*3/uL (ref 0.2–0.9)
Monocytes Relative: 15 %
NEUTROS ABS: 2.5 10*3/uL (ref 1.4–6.5)
NEUTROS PCT: 54 %
Platelets: 131 10*3/uL — ABNORMAL LOW (ref 150–440)
RBC: 4.01 MIL/uL (ref 3.80–5.20)
RDW: 16.4 % — ABNORMAL HIGH (ref 11.5–14.5)
WBC: 4.6 10*3/uL (ref 3.6–11.0)

## 2016-10-23 LAB — COMPREHENSIVE METABOLIC PANEL
ALBUMIN: 3.5 g/dL (ref 3.5–5.0)
ALT: 16 U/L (ref 14–54)
ANION GAP: 5 (ref 5–15)
AST: 21 U/L (ref 15–41)
Alkaline Phosphatase: 65 U/L (ref 38–126)
BUN: 15 mg/dL (ref 6–20)
CO2: 27 mmol/L (ref 22–32)
CREATININE: 0.82 mg/dL (ref 0.44–1.00)
Calcium: 8.8 mg/dL — ABNORMAL LOW (ref 8.9–10.3)
Chloride: 105 mmol/L (ref 101–111)
GFR calc Af Amer: >60 mL/min (ref >60)
GFR calc non Af Amer: >60 mL/min (ref >60)
GLUCOSE: 91 mg/dL (ref 65–99)
Potassium: 3.6 mmol/L (ref 3.5–5.1)
SODIUM: 137 mmol/L (ref 135–145)
Total Bilirubin: 0.5 mg/dL (ref 0.3–1.2)
Total Protein: 6.4 g/dL — ABNORMAL LOW (ref 6.5–8.1)

## 2016-10-23 LAB — URINALYSIS, COMPLETE (UACMP) WITH MICROSCOPIC
BACTERIA UA: NONE SEEN
Bilirubin Urine: NEGATIVE
Glucose, UA: NEGATIVE mg/dL
Hgb urine dipstick: NEGATIVE
KETONES UR: NEGATIVE mg/dL
LEUKOCYTES UA: NEGATIVE
Nitrite: NEGATIVE
PROTEIN: NEGATIVE mg/dL
RBC / HPF: NONE SEEN RBC/hpf (ref 0–5)
Specific Gravity, Urine: 1.011 (ref 1.005–1.030)
pH: 5 (ref 5.0–8.0)

## 2016-10-23 MED ORDER — SODIUM CHLORIDE 0.9 % IV SOLN
2400.0000 mg/m2 | INTRAVENOUS | Status: DC
  Administered 2016-10-23: 3350 mg via INTRAVENOUS
  Filled 2016-10-23: qty 67

## 2016-10-23 MED ORDER — SODIUM CHLORIDE 0.9 % IV SOLN
Freq: Once | INTRAVENOUS | Status: AC
  Administered 2016-10-23: 10:00:00 via INTRAVENOUS
  Filled 2016-10-23: qty 1000

## 2016-10-23 MED ORDER — HEPARIN SOD (PORK) LOCK FLUSH 100 UNIT/ML IV SOLN: 500.0000 [IU] | Freq: Once | INTRAVENOUS | Status: DC

## 2016-10-23 MED ORDER — DEXAMETHASONE SODIUM PHOSPHATE 10 MG/ML IJ SOLN
10.0000 mg | Freq: Once | INTRAMUSCULAR | Status: AC
  Administered 2016-10-23: 10 mg via INTRAVENOUS
  Filled 2016-10-23: qty 1

## 2016-10-23 MED ORDER — SODIUM CHLORIDE 0.9% FLUSH
10.0000 mL | INTRAVENOUS | Status: DC | PRN
Start: 2016-10-23 — End: 2016-10-23
  Administered 2016-10-23: 10 mL via INTRAVENOUS
  Filled 2016-10-23: qty 10

## 2016-10-23 MED ORDER — LEUCOVORIN CALCIUM INJECTION 100 MG
20.0000 mg/m2 | Freq: Once | INTRAMUSCULAR | Status: AC
  Administered 2016-10-23: 28 mg via INTRAVENOUS
  Filled 2016-10-23: qty 1.4

## 2016-10-23 MED ORDER — LEUCOVORIN CALCIUM INJECTION 350 MG: 400.0000 mg/m2 | Freq: Once | INTRAVENOUS | Status: DC

## 2016-10-23 MED ORDER — BEVACIZUMAB CHEMO INJECTION 400 MG/16ML
5.0000 mg/kg | Freq: Once | INTRAVENOUS | Status: AC
  Administered 2016-10-23: 225 mg via INTRAVENOUS
  Filled 2016-10-23: qty 9

## 2016-10-23 MED ORDER — ONDANSETRON 8 MG PO TBDP
8.0000 mg | ORAL_TABLET | Freq: Once | ORAL | Status: AC
  Administered 2016-10-23: 8 mg via ORAL
  Filled 2016-10-23: qty 1

## 2016-10-23 NOTE — Progress Notes (Signed)
[facility]  [phone] [phone]  ID: [name] OB: 1960/07/11  MR#: [id number]  IRC#:[id number]  Patient Care Team: Coral Spikes, DO as PCP - General (Family Medicine)  CHIEF COMPLAINT: Metastatic right sided colon cancer.  INTERVAL HISTORY:  Patient returns to clinic today for further evaluation and continuation of 5-FU plus Avastin. She is tolerating her treatments well without significant side effects. She has intermittent loose stools that is well-controlled with Imodium and Lomotil. She has no neurologic complaints. She denies any recent fevers. She has a fair appetite. She has no chest pain or shortness of breath. She denies any nausea or vomiting. She has no urinary complaint. Patient otherwise feels well and offers no further specific complaints.  REVIEW OF SYSTEMS:   Review of Systems  Constitutional: Negative.  Negative for fever, malaise/fatigue and weight loss.  Respiratory: Negative.  Negative for cough and shortness of breath.   Cardiovascular: Negative.  Negative for chest pain and leg swelling.  Gastrointestinal: Positive for diarrhea. Negative for abdominal pain.  Genitourinary: Negative.   Musculoskeletal: Positive for back pain.  Skin: Negative.  Negative for rash.  Neurological: Negative.  Negative for sensory change and weakness.  Psychiatric/Behavioral: Negative.  The patient is not nervous/anxious.     As per HPI. Otherwise, a complete review of systems is negative.  PAST MEDICAL HISTORY: Past Medical History:  Diagnosis Date  . Chicken pox   . Colon cancer ([place])    Partial colectomy 07/2015 + chemo tx's  . Hypertension   . Hypokalemia   . Menopause    > 5 yrs  . Peripheral neuropathy due to chemotherapy [facility])     PAST SURGICAL HISTORY: Past Surgical History:  Procedure Laterality Date  . BREAST BIOPSY Right    2007? pt unsure done by [facility]  . COLONOSCOPY    . COLONOSCOPY WITH PROPOFOL N/A 10/14/2015     Procedure: COLONOSCOPY WITH PROPOFOL;  Surgeon: Hulen Luster, MD;  Location: [facility] ENDOSCOPY;  Service: Endoscopy;  Laterality: N/A;  . COLOSTOMY REVISION Right 08/09/2015   Procedure: COLON RESECTION RIGHT;  Surgeon: Florene Glen, MD;  Location: [facility] ORS;  Service: General;  Laterality: Right;  . ECTOPIC PREGNANCY SURGERY    . PORTACATH PLACEMENT N/A 09/01/2015   Procedure: INSERTION PORT-A-CATH;  Surgeon: Florene Glen, MD;  Location: [facility] ORS;  Service: General;  Laterality: N/A;  . SHOULDER SURGERY Left     FAMILY HISTORY: Family History  Problem Relation Age of Onset  . Hypertension Mother   . Arthritis Father   . Prostate cancer Father   . Diabetes Maternal Grandmother   . Colon cancer Maternal Grandfather        colon    ADVANCED DIRECTIVES (Y/N):  N  HEALTH MAINTENANCE: Social History  Substance Use Topics  . Smoking status: Former Smoker    Packs/day: 0.25    Years: 2.00  . Smokeless tobacco: Never Used     Comment: recently quit  . Alcohol use No     Colonoscopy:  PAP:  Bone density:  Lipid panel:  No Known Allergies  Current Outpatient Prescriptions  Medication Sig Dispense Refill  . benazepril-hydrochlorthiazide (LOTENSIN HCT) 20-12.5 MG tablet Take 1 tablet by mouth daily. 60 tablet 3  . diphenoxylate-atropine (LOMOTIL) 2.5-0.025 MG tablet Take 1 tablet by mouth 4 (four) times daily as needed for diarrhea or loose stools.    . DULoxetine (CYMBALTA) 20 MG capsule Take 1 capsule (20 mg total) by mouth  daily. 30 capsule 3  . feeding supplement (BOOST HIGH PROTEIN) LIQD Take 1 Container by mouth daily.    . ferrous sulfate (FERROUSUL) 325 (65 FE) MG tablet Take 1 tablet (325 mg total) by mouth daily with breakfast. 90 tablet 3  . gabapentin (NEURONTIN) 300 MG capsule One pill in am; and 2 pills at night prior to sleep. 90 capsule 3  . lidocaine-prilocaine (EMLA) cream Apply 1 application topically as needed. Apply generously over the Mediport 45 minutes  prior to chemotherapy. 30 g 6  . loperamide (IMODIUM) 2 MG capsule Take 2 mg by mouth as needed for diarrhea or loose stools.    . Multiple Vitamins-Minerals (MULTIVITAMIN ADULT PO) Take 1 tablet by mouth daily.    . ondansetron (ZOFRAN) 8 MG tablet Take 1 tablet (8 mg total) by mouth every 8 (eight) hours as needed for nausea or vomiting (start 3 days; after chemo). 40 tablet 0  . potassium chloride SA (K-DUR,KLOR-CON) 20 MEQ tablet Take 2 tablets (40 mEq total) by mouth 2 (two) times daily. (Patient taking differently: Take 20 mEq by mouth daily. ) 60 tablet 3  . acetaminophen (TYLENOL) 325 MG tablet Take 2 tablets (650 mg total) by mouth every 6 (six) hours as needed for mild pain (or Fever >/= 101). (Patient not taking: Reported on 09/11/2016)    . HYDROcodone-acetaminophen (NORCO/VICODIN) 5-325 MG tablet Take 1 tablet by mouth every 8 (eight) hours as needed for moderate pain. (Patient not taking: Reported on 10/23/2016) 40 tablet 0  . prochlorperazine (COMPAZINE) 10 MG tablet Take 1 tablet (10 mg total) by mouth every 6 (six) hours as needed for nausea or vomiting. (Patient not taking: Reported on 10/02/2016) 90 tablet 1   No current facility-administered medications for this visit.    Facility-Administered Medications Ordered in Other Visits  Medication Dose Route Frequency Provider Last Rate Last Dose  . fluorouracil (ADRUCIL) 3,350 mg in sodium chloride 0.9 % 83 mL chemo infusion  2,400 mg/m2 (Treatment Plan Recorded) Intravenous 1 day or 1 dose Lloyd Huger, MD   3,350 mg at 10/23/16 1120  . heparin lock flush 100 unit/mL  500 Units Intravenous Once Charlaine Dalton R, MD      . sodium chloride flush (NS) 0.9 % injection 10 mL  10 mL Intravenous PRN Cammie Sickle, MD   10 mL at 10/04/15 0901  . sodium chloride flush (NS) 0.9 % injection 10 mL  10 mL Intravenous PRN Charlaine Dalton R, MD   10 mL at 10/23/16 0822    OBJECTIVE: Vitals:   10/23/16 0844  BP: 107/83    Pulse: 88  Resp: 18  Temp: 97.1 F (36.2 C)     Body mass index is 20.98 kg/m.    ECOG FS:0 - Asymptomatic  General: Well-developed, well-nourished, no acute distress. Eyes: Pink conjunctiva, anicteric sclera. Lungs: Clear to auscultation bilaterally. Heart: Regular rate and rhythm. No rubs, murmurs, or gallops. Abdomen: Soft, nontender, nondistended. No organomegaly noted, normoactive bowel sounds. Musculoskeletal: No edema, cyanosis, or clubbing. Neuro: Alert, answering all questions appropriately. Cranial nerves grossly intact. Skin: No rashes or petechiae noted. Psych: Normal affect.   LAB RESULTS:  Lab Results  Component Value Date   NA 137 10/23/2016   K 3.6 10/23/2016   CL 105 10/23/2016   CO2 27 10/23/2016   GLUCOSE 91 10/23/2016   BUN 15 10/23/2016   CREATININE 0.82 10/23/2016   CALCIUM 8.8 (L) 10/23/2016   PROT 6.4 (L) 10/23/2016   ALBUMIN  3.5 10/23/2016   AST 21 10/23/2016   ALT 16 10/23/2016   ALKPHOS 65 10/23/2016   BILITOT 0.5 10/23/2016   GFRNONAA >60 10/23/2016   GFRAA >60 10/23/2016    Lab Results  Component Value Date   WBC 4.6 10/23/2016   NEUTROABS 2.5 10/23/2016   HGB 12.9 10/23/2016   HCT 37.2 10/23/2016   MCV 92.7 10/23/2016   PLT 131 (L) 10/23/2016     STUDIES: No results found.  ASSESSMENT & PLAN:   .Cancer of ascending colon metastatic to intra-abdominal lymph node ([place])  # Colon cancer- cecal/ right-sided; STAGE IV [intraabdominal/retroperitonel LN/Supracla; K-ras mutated]. Currently on 5FU palliative chemotherapy+ avastin every 3 w. MARCH 9th th CT- Improved/CR except for LUL 4 mm lung nodules; L2- new lytic/sclerotic lesion. April 2018- Bone scan-met vs degenerative   # proceed with 5FU+ Avastin today.   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   # Bone lesion L-2/symptomatic- On ca+ vit D BID. Improvement in pain  continue hydrocodone prn; patient has completed XRT. Continue Xgeva as indicated.   #   return to clinic in 3 weeks with M.D. evaluation, chemotherapy Avastin/5-FU; XJayme Cloud   Patient expressed understanding and was in agreement with this plan. She also understands that She can call clinic at any time with any questions, concerns, or complaints.    TLloyd Huger MD   10/23/2016 1:59 PM

## 2016-10-23 NOTE — Progress Notes (Signed)
Patient here for colon cancer followup and avastin & 16fu. Pt reports intermittent episodes of 4 loose stools every other day. Relieved with otc imodium AD and lomotil.   She also reports that she is due for her mammogram screening- pt inquired if oncologist could place order for mammogram.

## 2016-10-24 LAB — CEA: CEA: 4.2 ng/mL (ref 0.0–4.7)

## 2016-10-25 ENCOUNTER — Inpatient Hospital Stay: Payer: BLUE CROSS/BLUE SHIELD

## 2016-10-25 DIAGNOSIS — C182 Malignant neoplasm of ascending colon: Secondary | ICD-10-CM | POA: Diagnosis not present

## 2016-10-25 DIAGNOSIS — C772 Secondary and unspecified malignant neoplasm of intra-abdominal lymph nodes: Principal | ICD-10-CM

## 2016-10-25 MED ORDER — HEPARIN SOD (PORK) LOCK FLUSH 100 UNIT/ML IV SOLN
500.0000 [IU] | Freq: Once | INTRAVENOUS | Status: AC | PRN
  Administered 2016-10-25: 500 [IU]

## 2016-10-25 MED ORDER — HEPARIN SOD (PORK) LOCK FLUSH 100 UNIT/ML IV SOLN
INTRAVENOUS | Status: AC
  Filled 2016-10-25: qty 5

## 2016-10-25 MED ORDER — SODIUM CHLORIDE 0.9% FLUSH
10.0000 mL | INTRAVENOUS | Status: DC | PRN
  Administered 2016-10-25: 10 mL
  Filled 2016-10-25: qty 10

## 2016-10-26 ENCOUNTER — Telehealth: Payer: Self-pay | Admitting: *Deleted

## 2016-10-26 NOTE — Telephone Encounter (Signed)
RN reviewed chart - cea level is 4.2. Contacted pt to inform her of these lab results.  Left vm msg for pt to return my phone call -

## 2016-10-26 NOTE — Telephone Encounter (Signed)
1141-pt returned phone call - cea 4.2.  Pt reassure that cea tumor marker wnl.

## 2016-11-13 ENCOUNTER — Inpatient Hospital Stay: Payer: BLUE CROSS/BLUE SHIELD

## 2016-11-13 ENCOUNTER — Inpatient Hospital Stay (HOSPITAL_BASED_OUTPATIENT_CLINIC_OR_DEPARTMENT_OTHER): Payer: BLUE CROSS/BLUE SHIELD | Admitting: Internal Medicine

## 2016-11-13 VITALS — BP 106/71 | HR 85 | Temp 97.6°F | Resp 18 | Ht 60.0 in | Wt 107.6 lb

## 2016-11-13 DIAGNOSIS — I10 Essential (primary) hypertension: Secondary | ICD-10-CM | POA: Diagnosis not present

## 2016-11-13 DIAGNOSIS — C182 Malignant neoplasm of ascending colon: Secondary | ICD-10-CM

## 2016-11-13 DIAGNOSIS — I700 Atherosclerosis of aorta: Secondary | ICD-10-CM

## 2016-11-13 DIAGNOSIS — C7951 Secondary malignant neoplasm of bone: Secondary | ICD-10-CM

## 2016-11-13 DIAGNOSIS — Z923 Personal history of irradiation: Secondary | ICD-10-CM

## 2016-11-13 DIAGNOSIS — R197 Diarrhea, unspecified: Secondary | ICD-10-CM

## 2016-11-13 DIAGNOSIS — D696 Thrombocytopenia, unspecified: Secondary | ICD-10-CM

## 2016-11-13 DIAGNOSIS — Z87891 Personal history of nicotine dependence: Secondary | ICD-10-CM

## 2016-11-13 DIAGNOSIS — I2510 Atherosclerotic heart disease of native coronary artery without angina pectoris: Secondary | ICD-10-CM

## 2016-11-13 DIAGNOSIS — E876 Hypokalemia: Secondary | ICD-10-CM

## 2016-11-13 DIAGNOSIS — Z800 Family history of malignant neoplasm of digestive organs: Secondary | ICD-10-CM

## 2016-11-13 DIAGNOSIS — C772 Secondary and unspecified malignant neoplasm of intra-abdominal lymph nodes: Secondary | ICD-10-CM | POA: Diagnosis not present

## 2016-11-13 DIAGNOSIS — R918 Other nonspecific abnormal finding of lung field: Secondary | ICD-10-CM

## 2016-11-13 DIAGNOSIS — M549 Dorsalgia, unspecified: Secondary | ICD-10-CM

## 2016-11-13 DIAGNOSIS — Z9049 Acquired absence of other specified parts of digestive tract: Secondary | ICD-10-CM

## 2016-11-13 DIAGNOSIS — Z79899 Other long term (current) drug therapy: Secondary | ICD-10-CM

## 2016-11-13 LAB — URINALYSIS, COMPLETE (UACMP) WITH MICROSCOPIC
Bacteria, UA: NONE SEEN
Bilirubin Urine: NEGATIVE
Glucose, UA: NEGATIVE mg/dL
Hgb urine dipstick: NEGATIVE
Ketones, ur: NEGATIVE mg/dL
Nitrite: NEGATIVE
PH: 5 (ref 5.0–8.0)
Protein, ur: 30 mg/dL — AB
SPECIFIC GRAVITY, URINE: 1.011 (ref 1.005–1.030)

## 2016-11-13 LAB — CBC WITH DIFFERENTIAL/PLATELET
Basophils Absolute: 0 10*3/uL (ref 0–0.1)
Basophils Relative: 1 %
Eosinophils Absolute: 0.1 10*3/uL (ref 0–0.7)
Eosinophils Relative: 2 %
HCT: 36.9 % (ref 35.0–47.0)
HEMOGLOBIN: 12.5 g/dL (ref 12.0–16.0)
LYMPHS ABS: 1 10*3/uL (ref 1.0–3.6)
LYMPHS PCT: 20 %
MCH: 31.5 pg (ref 26.0–34.0)
MCHC: 34 g/dL (ref 32.0–36.0)
MCV: 92.8 fL (ref 80.0–100.0)
MONO ABS: 0.5 10*3/uL (ref 0.2–0.9)
MONOS PCT: 10 %
NEUTROS PCT: 67 %
Neutro Abs: 3.6 10*3/uL (ref 1.4–6.5)
Platelets: 138 10*3/uL — ABNORMAL LOW (ref 150–440)
RBC: 3.97 MIL/uL (ref 3.80–5.20)
RDW: 16.6 % — AB (ref 11.5–14.5)
WBC: 5.3 10*3/uL (ref 3.6–11.0)

## 2016-11-13 LAB — COMPREHENSIVE METABOLIC PANEL
ALK PHOS: 62 U/L (ref 38–126)
ALT: 14 U/L (ref 14–54)
AST: 19 U/L (ref 15–41)
Albumin: 3.7 g/dL (ref 3.5–5.0)
Anion gap: 8 (ref 5–15)
BILIRUBIN TOTAL: 0.4 mg/dL (ref 0.3–1.2)
BUN: 12 mg/dL (ref 6–20)
CALCIUM: 8.5 mg/dL — AB (ref 8.9–10.3)
CO2: 22 mmol/L (ref 22–32)
Chloride: 107 mmol/L (ref 101–111)
Creatinine, Ser: 0.8 mg/dL (ref 0.44–1.00)
GFR calc Af Amer: >60 mL/min (ref >60)
GLUCOSE: 127 mg/dL — AB (ref 65–99)
POTASSIUM: 3.3 mmol/L — AB (ref 3.5–5.1)
Sodium: 137 mmol/L (ref 135–145)
TOTAL PROTEIN: 6.6 g/dL (ref 6.5–8.1)

## 2016-11-13 MED ORDER — LEUCOVORIN CALCIUM INJECTION 350 MG: 400.0000 mg/m2 | Freq: Once | INTRAVENOUS | Status: DC

## 2016-11-13 MED ORDER — SODIUM CHLORIDE 0.9 % IV SOLN
2400.0000 mg/m2 | INTRAVENOUS | Status: DC
  Administered 2016-11-13: 3350 mg via INTRAVENOUS
  Filled 2016-11-13: qty 67

## 2016-11-13 MED ORDER — DIPHENOXYLATE-ATROPINE 2.5-0.025 MG PO TABS: 1.0000 | ORAL_TABLET | Freq: Four times a day (QID) | ORAL | 0 refills | Status: DC | PRN

## 2016-11-13 MED ORDER — ONDANSETRON 8 MG PO TBDP
8.0000 mg | ORAL_TABLET | Freq: Once | ORAL | Status: AC
  Administered 2016-11-13: 8 mg via ORAL
  Filled 2016-11-13: qty 1

## 2016-11-13 MED ORDER — SODIUM CHLORIDE 0.9 % IV SOLN
5.0000 mg/kg | Freq: Once | INTRAVENOUS | Status: AC
  Administered 2016-11-13: 225 mg via INTRAVENOUS
  Filled 2016-11-13: qty 9

## 2016-11-13 MED ORDER — LEUCOVORIN CALCIUM INJECTION 100 MG
20.0000 mg/m2 | Freq: Once | INTRAMUSCULAR | Status: AC
  Administered 2016-11-13: 28 mg via INTRAVENOUS
  Filled 2016-11-13: qty 1.4

## 2016-11-13 MED ORDER — SODIUM CHLORIDE 0.9 % IV SOLN
Freq: Once | INTRAVENOUS | Status: AC
  Administered 2016-11-13: 12:00:00 via INTRAVENOUS
  Filled 2016-11-13: qty 1000

## 2016-11-13 NOTE — Assessment & Plan Note (Addendum)
#  Colon cancer- cecal/ right-sided; STAGE IV [intraabdominal/retroperitonel LN/Supracla; K-ras mutated]. Currently on 5FU palliative chemotherapy+ avastin every 3 w; tolerating except for mild worsening of the diarrhea. [see discussion below]. No obvious evidence of clinical progression. We will plan to get CT scan prior to next visit.  # proceed with chemotherapy today. Labs today reviewed;  acceptable for treatment today- except for- [see below]Mild thrombocytopenia.  # Thrombocytopenia platelets 131 today/- G-1. 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   # symptomatic L2 bone lesion status post radiation finished May 2018. Mild pain. Await repeat imaging.  # Diarrhea- G-2; given script for lomotil  # CT scan on 07/13; follow up MD/labs/chemo on July 23rd [beach trip].

## 2016-11-13 NOTE — Progress Notes (Signed)
[facility] CONSULT NOTE  Patient Care Team: Coral Spikes, DO as PCP - General (Family Medicine)  CHIEF COMPLAINTS/PURPOSE OF CONSULTATION:   Oncology History   # [facility] 2017- COLON CANCER- STAGE IV [s/p right hemi-colectomy; pT4apN2 (7/19LN)]; Pre-op CEA- [id number]  PET-  mesenteric adenopathy/mediastinal adenopathy/right-sided subclavicular lymph node.    April 17th -START FOLFOX; May 1st Add Avastin;   Aug 16th- Disc [name] [place] PN]; con 5FU-Avastin; NOV 27th CT C/A/P- CR; except for 37m LUL; 5FU-Avastin q 3 W  # March 2018- L2 uptake MET s/p RT [[facility]2018]; mid April X-geva   # MOLECULAR TESTING: K-ras-EXON 2- MUTATED; MSI- STABLE.      [place]   09/[place] Initial Diagnosis    Cancer of ascending colon metastatic to intra-abdominal lymph node ([facility])        HISTORY OF PRESENTING ILLNESS:  Nancy GAMBINO89y.o.  female  with a diagnosed Metastatic right-sided colon cancer status post 5-FU plus Avastin; Is here for follow-up.  Patient continues to complain of mild pain in the back since finishing radiation. She is not taking pain medication regular basis. She also noticed to have increase in diarrhea about once every day. She has been taking Imodium. However the tingling and numbness in her feet is not any better. She is on Neurontin/Cymbalta. Denies any headaches.No epistaxis. No swelling in the legs. Appetite is good. Weight is stable at 107 pounds.  ROS: A complete 10 point review of system is done which is negative except mentioned above in history of present illness  MEDICAL HISTORY:  Past Medical History:  Diagnosis Date  . Chicken pox   . [place]    Partial colectomy 07/2015 + chemo tx's  . Hypertension   . Hypokalemia   . Menopause    > 5 yrs  . Peripheral neuropathy due to chemotherapy ([facility]     SURGICAL HISTORY: Past Surgical History:  Procedure Laterality Date  .  BREAST BIOPSY Right    2007? pt unsure done by [facility]  . COLONOSCOPY    . COLONOSCOPY WITH PROPOFOL N/A 10/14/2015   Procedure: COLONOSCOPY WITH PROPOFOL;  Surgeon: PHulen Luster MD;  [facility];  Service: Endoscopy;  Laterality: N/A;  . COLOSTOMY REVISION Right 08/09/2015   Procedure: COLON RESECTION RIGHT;  Surgeon: RFlorene Glen MD;  Location: [facility] ORS;  Service: General;  Laterality: Right;  . ECTOPIC PREGNANCY SURGERY    . PORTACATH PLACEMENT N/A 09/01/2015   Procedure: INSERTION PORT-A-CATH;  Surgeon: RFlorene Glen MD;  Location: [facility] ORS;  Service: General;  Laterality: N/A;  . SHOULDER SURGERY Left     [place]IAL H[place]  1 pack in 3 days; no alcohol. 2 childern [25 & 17 years] Social History   Social History  . Marital status: Married    Spouse name: N/A  . Number of children: N/A  . Years of education: N/A   Occupational History  . Not on file.   Social History Main Topics  . Smoking status: Former Smoker    Packs/day: 0.25    Years: 2.00  . Smokeless tobacco: Never Used     Comment: recently quit  . Alcohol use No  . Drug use: No  . Sexual activity: Yes   Other Topics Concern  . Not on file   Social History Narrative  . No narrative on file    FAMILY HISTORY: 1  brother& 1 sister- no cancer. Mom-no cancer Family History  Problem Relation Age of Onset  . Hypertension Mother   . Arthritis Father   . Prostate cancer Father   . Diabetes Maternal Grandmother   . Colon cancer Maternal Grandfather        colon    ALLERGIES:  has No Known Allergies.  MEDICATIONS:  Current Outpatient Prescriptions  Medication Sig Dispense Refill  . acetaminophen (TYLENOL) 325 MG tablet Take 2 tablets (650 mg total) by mouth every 6 (six) hours as needed for mild pain (or Fever >/= 101).    . benazepril-hydrochlorthiazide (LOTENSIN HCT) 20-12.5 MG tablet Take 1 tablet by mouth daily. 60 tablet 3  . diphenoxylate-atropine  (LOMOTIL) 2.5-0.025 MG tablet Take 1 tablet by mouth 4 (four) times daily as needed for diarrhea or loose stools.    . DULoxetine (CYMBALTA) 20 MG capsule Take 1 capsule (20 mg total) by mouth daily. 30 capsule 3  . feeding supplement (BOOST HIGH PROTEIN) LIQD Take 1 Container by mouth daily.    . ferrous sulfate (FERROUSUL) 325 (65 FE) MG tablet Take 1 tablet (325 mg total) by mouth daily with breakfast. 90 tablet 3  . gabapentin (NEURONTIN) 300 MG capsule One pill in am; and 2 pills at night prior to sleep. 90 capsule 3  . lidocaine-prilocaine (EMLA) cream Apply 1 application topically as needed. Apply generously over the Mediport 45 minutes prior to chemotherapy. 30 g 6  . loperamide (IMODIUM) 2 MG capsule Take 2 mg by mouth as needed for diarrhea or loose stools.    . Multiple Vitamins-Minerals (MULTIVITAMIN ADULT PO) Take 1 tablet by mouth daily.    . ondansetron (ZOFRAN) 8 MG tablet Take 1 tablet (8 mg total) by mouth every 8 (eight) hours as needed for nausea or vomiting (start 3 days; after chemo). 40 tablet 0  . potassium chloride SA (K-DUR,KLOR-CON) 20 MEQ tablet Take 2 tablets (40 mEq total) by mouth 2 (two) times daily. (Patient taking differently: Take 20 mEq by mouth daily. ) 60 tablet 3  . prochlorperazine (COMPAZINE) 10 MG tablet Take 1 tablet (10 mg total) by mouth every 6 (six) hours as needed for nausea or vomiting. 90 tablet 1  . diphenoxylate-atropine (LOMOTIL) 2.5-0.025 MG tablet Take 1 tablet by mouth 4 (four) times daily as needed for diarrhea or loose stools. Take it along with immodium 40 tablet 0  . HYDROcodone-acetaminophen (NORCO/VICODIN) 5-325 MG tablet Take 1 tablet by mouth every 8 (eight) hours as needed for moderate pain. (Patient not taking: Reported on 10/23/2016) 40 tablet 0   No current facility-administered medications for this visit.    Facility-Administered Medications Ordered in Other Visits  Medication Dose Route Frequency Provider Last Rate Last Dose  .  sodium chloride flush (NS) 0.9 % injection 10 mL  10 mL Intravenous PRN Cammie Sickle, MD   10 mL at 10/04/15 0901      .  PHYSICAL EXAMINATION: ECOG PERFORMANCE STATUS: 0 - Asymptomatic  Vitals:   11/13/16 1019  BP: 106/71  Pulse: 85  Resp: 18  Temp: 97.6 F (36.4 C)   Filed Weights   11/13/16 1019  Weight: 107 lb 9.6 oz (48.8 kg)    GENERAL: Well-nourished well-developed; thin built; Alert, no distress and comfortable.   Accompanied by her mother.  EYES: no pallor or icterus OROPHARYNX: no thrush or ulceration; good dentition  NECK: supple, no masses felt LYMPH:  no palpable lymphadenopathy in the cervical, axillary or inguinal regions LUNGS:  clear to auscultation and  No wheeze or crackles HEART/CVS: regular rate & rhythm and no murmurs; No lower extremity edema ABDOMEN: abdomen soft, non-tender and normal bowel sounds; No infections. Musculoskeletal:no cyanosis of digits and no clubbing  PSYCH: alert & oriented x 3 with fluent speech NEURO: no focal motor/sensory deficits SKIN:  no rashes or significant lesions  LABORATORY DATA:  I have reviewed the data as listed Lab Results  Component Value Date   WBC 5.3 11/13/2016   HGB 12.5 11/13/2016   HCT 36.9 11/13/2016   MCV 92.8 11/13/2016   PLT 138 (L) 11/13/2016    Recent Labs  10/02/16 0845 10/23/16 0811 11/13/16 0953  NA 137 137 137  K 3.5 3.6 3.3*  CL 104 105 107  CO2 _0 GLUCOSE 95 91 127*  BUN _1 CREATININE 0.79 0.82 0.80  CALCIUM 8.6* 8.8* 8.5*  GFRNONAA >60 >60 >60  GFRAA >60 >60 >60  PROT 7.2 6.4* 6.6  ALBUMIN 3.9 3.5 3.7  AST _2 ALT _3 ALKPHOS 83 65 62  BILITOT 0.5 0.5 0.4   IMPRESSION: 1. Increasing area of mixed lucency and sclerosis in the left side of the L2 vertebral body, concerning for a new metastatic lesion. Further evaluation with bone scan is recommended. 2. No other definite sites of metastatic disease noted elsewhere in the chest, abdomen  or pelvis. 3. Previously noted tiny pulmonary nodules near the apex of the left upper lobe are stable compared to prior studies, favored to be benign. 4. Aortic atherosclerosis, in addition to right coronary artery disease. Please note that although the presence of coronary artery calcium documents the presence of coronary artery disease, the severity of this disease and any potential stenosis cannot be assessed on this non-gated CT examination. Assessment for potential risk factor modification, dietary therapy or pharmacologic therapy may be warranted, if clinically indicated. 5. Additional incidental findings, as above. These results will be called to the ordering clinician or representative by the Radiologist Assistant, and communication documented in the PACS or zVision Dashboard.   Electronically Signed   By: Vinnie Langton M.D.   On: 07/28/2016 11:06 -------------------------------------------------------------------------------------------------------------- IMPRESSION: Focal area of increased activity noted over the left portion of the L2 vertebral body. This corresponds to abnormal vertebral body noted on recent CT. Although these changes may be degenerative, a process such as metastatic disease cannot be excluded. Degenerative changes noted elsewhere. No other focal abnormalities noted to suggest metastatic disease .   Electronically Signed   By: Marcello Moores  Register   On: 08/16/2016 14:19   ASSESSMENT & PLAN:   .Cancer of ascending colon metastatic to intra-abdominal lymph node ([place]) # Colon cancer- cecal/ right-sided; STAGE IV [intraabdominal/retroperitonel LN/Supracla; K-ras mutated]. Currently on 5FU palliative chemotherapy+ avastin every 3 w; tolerating except for mild worsening of the diarrhea. [see discussion below]. No obvious evidence of clinical progression. We will plan to get CT scan prior to next visit.  # proceed with chemotherapy today. Labs today  reviewed;  acceptable for treatment today- except for- [see below]Mild thrombocytopenia.  # Thrombocytopenia platelets 131 today/- G-1. 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   # symptomatic L2 bone lesion status post radiation finished May 2018. Mild pain. Await repeat imaging.  # Diarrhea- G-2; given script for lomotil  # CT scan on 07/13; follow up MD/labs/chemo on July 23rd [beach trip].  Cammie Sickle, MD 11/13/2016 6:33 PM

## 2016-11-14 LAB — CEA: CEA: 4.4 ng/mL (ref 0.0–4.7)

## 2016-11-15 ENCOUNTER — Inpatient Hospital Stay: Payer: BLUE CROSS/BLUE SHIELD

## 2016-11-15 VITALS — BP 103/66 | HR 80 | Temp 97.0°F | Resp 18

## 2016-11-15 DIAGNOSIS — C182 Malignant neoplasm of ascending colon: Secondary | ICD-10-CM | POA: Diagnosis not present

## 2016-11-15 DIAGNOSIS — C772 Secondary and unspecified malignant neoplasm of intra-abdominal lymph nodes: Principal | ICD-10-CM

## 2016-11-15 MED ORDER — SODIUM CHLORIDE 0.9% FLUSH
10.0000 mL | INTRAVENOUS | Status: DC | PRN
  Administered 2016-11-15: 10 mL
  Filled 2016-11-15: qty 10

## 2016-11-15 MED ORDER — HEPARIN SOD (PORK) LOCK FLUSH 100 UNIT/ML IV SOLN
500.0000 [IU] | Freq: Once | INTRAVENOUS | Status: AC | PRN
  Administered 2016-11-15: 500 [IU]

## 2016-11-20 ENCOUNTER — Encounter: Payer: Self-pay | Admitting: Radiation Oncology

## 2016-11-20 ENCOUNTER — Ambulatory Visit
Admission: RE | Admit: 2016-11-20 | Discharge: 2016-11-20 | Disposition: A | Discharge status: Home / Self Care | Payer: BLUE CROSS/BLUE SHIELD | Source: Ambulatory Visit | Attending: Radiation Oncology | Admitting: Radiation Oncology

## 2016-11-20 VITALS — BP 108/74 | HR 84 | Temp 97.8°F | Wt 106.8 lb

## 2016-11-20 DIAGNOSIS — C189 Malignant neoplasm of colon, unspecified: Secondary | ICD-10-CM | POA: Insufficient documentation

## 2016-11-20 DIAGNOSIS — Z9221 Personal history of antineoplastic chemotherapy: Secondary | ICD-10-CM | POA: Diagnosis not present

## 2016-11-20 DIAGNOSIS — C7951 Secondary malignant neoplasm of bone: Secondary | ICD-10-CM | POA: Insufficient documentation

## 2016-11-20 DIAGNOSIS — Z923 Personal history of irradiation: Secondary | ICD-10-CM | POA: Diagnosis not present

## 2016-11-20 NOTE — Progress Notes (Signed)
Radiation Oncology Follow up Note  Name: Nancy Clark   Date:   11/20/2016 MRN:  [id number] DOB: 1960-09-03    This 56 y.o. female presents to the clinic today for one-month follow-up status post palliative radiation therapy to her lumbar spine for stage IV colon cancer.  REFERRING PROVIDER: Coral Spikes, DO  HPI: Patient is a 56 year old female now 1 month out having completed palliative radiation therapy to her lumbar spine for metastatic involvement of stage IV colon cancer. She is seen today in routine follow-up and is doing well. The pain has markedly subsided. She is ambulating well. She states his no other areas of pain at this time.. She is currently on 5-FU plus Avastin palliative chemotherapy. She otherwise is doing well.  COMPLICATIONS OF TREATMENT: none  FOLLOW UP COMPLIANCE: keeps appointments   PHYSICAL EXAM:  BP 108/74   Pulse 84   Temp 97.8 F (36.6 C)   Wt 106 lb 13 oz (48.5 kg)   LMP  (LMP Unknown) Comment: LMP MORE THAN 5 YRS  BMI 20.86 kg/m  Motor sensory and DTR levels are equal and symmetric in the lower extremities. Range of motion of the lower extremities does not elicit pain. Deep palpation of her spine does not elicit pain. Well-developed well-nourished patient in NAD. HEENT reveals PERLA, EOMI, discs not visualized.  Oral cavity is clear. No oral mucosal lesions are identified. Neck is clear without evidence of cervical or supraclavicular adenopathy. Lungs are clear to A&P. Cardiac examination is essentially unremarkable with regular rate and rhythm without murmur rub or thrill. Abdomen is benign with no organomegaly or masses noted. Motor sensory and DTR levels are equal and symmetric in the upper and lower extremities. Cranial nerves II through XII are grossly intact. Proprioception is intact. No peripheral adenopathy or edema is identified. No motor or sensory levels are noted. Crude visual fields are within normal range.  RADIOLOGY RESULTS: No current  films for review  PLAN: At the present time patient is achieved excellent palliative benefit to her radiation therapy. I will turn follow-up care over to medical oncology. I would be happy to reevaluate the patient any time should further radiation therapy be indicated.  I would like to take this opportunity to thank you for allowing me to participate in the care of your patient.Armstead Peaks., MD

## 2016-11-24 ENCOUNTER — Other Ambulatory Visit: Payer: BLUE CROSS/BLUE SHIELD

## 2016-11-24 ENCOUNTER — Ambulatory Visit: Payer: BLUE CROSS/BLUE SHIELD

## 2016-11-24 ENCOUNTER — Ambulatory Visit: Payer: BLUE CROSS/BLUE SHIELD | Admitting: Internal Medicine

## 2016-12-01 ENCOUNTER — Ambulatory Visit
Admission: RE | Admit: 2016-12-01 | Discharge: 2016-12-01 | Disposition: A | Discharge status: Home / Self Care | Payer: BLUE CROSS/BLUE SHIELD | Source: Ambulatory Visit | Attending: Internal Medicine | Admitting: Internal Medicine

## 2016-12-01 DIAGNOSIS — M899 Disorder of bone, unspecified: Secondary | ICD-10-CM | POA: Insufficient documentation

## 2016-12-01 DIAGNOSIS — C772 Secondary and unspecified malignant neoplasm of intra-abdominal lymph nodes: Secondary | ICD-10-CM | POA: Diagnosis not present

## 2016-12-01 DIAGNOSIS — I700 Atherosclerosis of aorta: Secondary | ICD-10-CM | POA: Insufficient documentation

## 2016-12-01 DIAGNOSIS — C182 Malignant neoplasm of ascending colon: Secondary | ICD-10-CM | POA: Diagnosis not present

## 2016-12-01 MED ORDER — IOPAMIDOL (ISOVUE-300) INJECTION 61%
100.0000 mL | Freq: Once | INTRAVENOUS | Status: AC | PRN
  Administered 2016-12-01: 100 mL via INTRAVENOUS

## 2016-12-04 ENCOUNTER — Ambulatory Visit: Payer: BLUE CROSS/BLUE SHIELD | Admitting: Internal Medicine

## 2016-12-04 ENCOUNTER — Other Ambulatory Visit: Payer: BLUE CROSS/BLUE SHIELD

## 2016-12-04 ENCOUNTER — Ambulatory Visit: Payer: BLUE CROSS/BLUE SHIELD

## 2016-12-11 ENCOUNTER — Inpatient Hospital Stay: Payer: BLUE CROSS/BLUE SHIELD

## 2016-12-11 ENCOUNTER — Inpatient Hospital Stay: Payer: BLUE CROSS/BLUE SHIELD | Attending: Internal Medicine | Admitting: Internal Medicine

## 2016-12-11 VITALS — BP 103/71 | Temp 96.2°F | Resp 16 | Wt 108.8 lb

## 2016-12-11 DIAGNOSIS — C182 Malignant neoplasm of ascending colon: Secondary | ICD-10-CM | POA: Insufficient documentation

## 2016-12-11 DIAGNOSIS — C7951 Secondary malignant neoplasm of bone: Secondary | ICD-10-CM | POA: Insufficient documentation

## 2016-12-11 DIAGNOSIS — R197 Diarrhea, unspecified: Secondary | ICD-10-CM | POA: Diagnosis not present

## 2016-12-11 DIAGNOSIS — I10 Essential (primary) hypertension: Secondary | ICD-10-CM | POA: Insufficient documentation

## 2016-12-11 DIAGNOSIS — Z923 Personal history of irradiation: Secondary | ICD-10-CM | POA: Diagnosis not present

## 2016-12-11 DIAGNOSIS — I700 Atherosclerosis of aorta: Secondary | ICD-10-CM | POA: Diagnosis not present

## 2016-12-11 DIAGNOSIS — M549 Dorsalgia, unspecified: Secondary | ICD-10-CM | POA: Insufficient documentation

## 2016-12-11 DIAGNOSIS — G620 Drug-induced polyneuropathy: Secondary | ICD-10-CM | POA: Insufficient documentation

## 2016-12-11 DIAGNOSIS — C772 Secondary and unspecified malignant neoplasm of intra-abdominal lymph nodes: Secondary | ICD-10-CM | POA: Diagnosis not present

## 2016-12-11 DIAGNOSIS — E876 Hypokalemia: Secondary | ICD-10-CM | POA: Diagnosis not present

## 2016-12-11 DIAGNOSIS — T451X5S Adverse effect of antineoplastic and immunosuppressive drugs, sequela: Secondary | ICD-10-CM | POA: Diagnosis not present

## 2016-12-11 DIAGNOSIS — D6959 Other secondary thrombocytopenia: Secondary | ICD-10-CM

## 2016-12-11 DIAGNOSIS — Z800 Family history of malignant neoplasm of digestive organs: Secondary | ICD-10-CM | POA: Insufficient documentation

## 2016-12-11 DIAGNOSIS — Z5112 Encounter for antineoplastic immunotherapy: Secondary | ICD-10-CM | POA: Diagnosis not present

## 2016-12-11 DIAGNOSIS — Z87891 Personal history of nicotine dependence: Secondary | ICD-10-CM | POA: Diagnosis not present

## 2016-12-11 DIAGNOSIS — Z79899 Other long term (current) drug therapy: Secondary | ICD-10-CM | POA: Diagnosis not present

## 2016-12-11 DIAGNOSIS — C786 Secondary malignant neoplasm of retroperitoneum and peritoneum: Secondary | ICD-10-CM | POA: Insufficient documentation

## 2016-12-11 LAB — URINALYSIS, COMPLETE (UACMP) WITH MICROSCOPIC
Bacteria, UA: NONE SEEN
Bilirubin Urine: NEGATIVE
GLUCOSE, UA: NEGATIVE mg/dL
Hgb urine dipstick: NEGATIVE
KETONES UR: NEGATIVE mg/dL
Nitrite: NEGATIVE
PH: 5 (ref 5.0–8.0)
Protein, ur: NEGATIVE mg/dL
SPECIFIC GRAVITY, URINE: 1.012 (ref 1.005–1.030)

## 2016-12-11 LAB — CBC WITH DIFFERENTIAL/PLATELET
Basophils Absolute: 0 10*3/uL (ref 0–0.1)
Basophils Relative: 1 %
Eosinophils Absolute: 0.1 10*3/uL (ref 0–0.7)
Eosinophils Relative: 2 %
HEMATOCRIT: 35.9 % (ref 35.0–47.0)
Hemoglobin: 12.3 g/dL (ref 12.0–16.0)
LYMPHS ABS: 1 10*3/uL (ref 1.0–3.6)
LYMPHS PCT: 17 %
MCH: 32 pg (ref 26.0–34.0)
MCHC: 34.2 g/dL (ref 32.0–36.0)
MCV: 93.6 fL (ref 80.0–100.0)
MONO ABS: 0.3 10*3/uL (ref 0.2–0.9)
MONOS PCT: 6 %
NEUTROS ABS: 4.5 10*3/uL (ref 1.4–6.5)
Neutrophils Relative %: 74 %
Platelets: 130 10*3/uL — ABNORMAL LOW (ref 150–440)
RBC: 3.84 MIL/uL (ref 3.80–5.20)
RDW: 16.8 % — AB (ref 11.5–14.5)
WBC: 5.9 10*3/uL (ref 3.6–11.0)

## 2016-12-11 LAB — COMPREHENSIVE METABOLIC PANEL
ALK PHOS: 51 U/L (ref 38–126)
ALT: 14 U/L (ref 14–54)
ANION GAP: 5 (ref 5–15)
AST: 22 U/L (ref 15–41)
Albumin: 3.6 g/dL (ref 3.5–5.0)
BILIRUBIN TOTAL: 0.5 mg/dL (ref 0.3–1.2)
BUN: 13 mg/dL (ref 6–20)
CALCIUM: 8.8 mg/dL — AB (ref 8.9–10.3)
CO2: 22 mmol/L (ref 22–32)
Chloride: 111 mmol/L (ref 101–111)
Creatinine, Ser: 0.87 mg/dL (ref 0.44–1.00)
GFR calc Af Amer: >60 mL/min (ref >60)
Glucose, Bld: 139 mg/dL — ABNORMAL HIGH (ref 65–99)
POTASSIUM: 3.3 mmol/L — AB (ref 3.5–5.1)
Sodium: 138 mmol/L (ref 135–145)
TOTAL PROTEIN: 6.4 g/dL — AB (ref 6.5–8.1)

## 2016-12-11 MED ORDER — SODIUM CHLORIDE 0.9 % IV SOLN
5.0000 mg/kg | Freq: Once | INTRAVENOUS | Status: AC
  Administered 2016-12-11: 225 mg via INTRAVENOUS
  Filled 2016-12-11: qty 9

## 2016-12-11 MED ORDER — SODIUM CHLORIDE 0.9 % IV SOLN
Freq: Once | INTRAVENOUS | Status: AC
  Administered 2016-12-11: 11:00:00 via INTRAVENOUS
  Filled 2016-12-11: qty 1000

## 2016-12-11 MED ORDER — LEUCOVORIN CALCIUM INJECTION 350 MG: 400.0000 mg/m2 | Freq: Once | INTRAVENOUS | Status: DC

## 2016-12-11 MED ORDER — LEUCOVORIN CALCIUM INJECTION 100 MG
20.0000 mg/m2 | Freq: Once | INTRAMUSCULAR | Status: AC
  Administered 2016-12-11: 28 mg via INTRAVENOUS
  Filled 2016-12-11: qty 1.4

## 2016-12-11 MED ORDER — ONDANSETRON 8 MG PO TBDP
8.0000 mg | ORAL_TABLET | Freq: Once | ORAL | Status: AC
  Administered 2016-12-11: 8 mg via ORAL
  Filled 2016-12-11: qty 1

## 2016-12-11 MED ORDER — SODIUM CHLORIDE 0.9 % IV SOLN
2400.0000 mg/m2 | INTRAVENOUS | Status: DC
  Administered 2016-12-11: 3350 mg via INTRAVENOUS
  Filled 2016-12-11: qty 67

## 2016-12-11 NOTE — Assessment & Plan Note (Addendum)
#  Colon cancer- cecal/ right-sided; STAGE IV [intraabdominal/retroperitonel LN/Supracla; K-ras mutated]. Currently on 5FU palliative chemotherapy+ avastin every 3 w. No obvious evidence of clinical progression. July 16th 2018- CT-NED; stable- L-2 bone lesion.   # proceed with chemotherapy today. Labs today reviewed;  acceptable for treatment today- except for- [see below] Mild thrombocytopenia. Again discussed that the treatments are palliative/indefinite.   # Thrombocytopenia platelets 130 today/- G-1. 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   # symptomatic L2 bone lesion status post radiation finished May 2018.CT July 2018- STABLE.   # Mild hypokalemia- 3.2- recommend continued dietary supplementation.  # follow up on aug 15th/labs/chemo/cea [pt pref]; 5FU+avastin.   # I reviewed the blood work- with the patient in detail; also reviewed the imaging independently [as summarized above]; and with the patient in detail.

## 2016-12-11 NOTE — Progress Notes (Signed)
Patient is here today for a follow up. Patient reports no new concerns today.  

## 2016-12-11 NOTE — Progress Notes (Signed)
[place] CONSULT NOTE  Patient Care Team: Nancy Spikes, DO as PCP - General (Family Medicine)  CHIEF COMPLAINTS/PURPOSE OF CONSULTATION:   Oncology History   # [facility] 2017- COLON CANCER- STAGE IV [s/p right hemi-colectomy; pT4apN2 (7/19LN)]; Pre-op CEA- [id number]  PET-  mesenteric adenopathy/mediastinal adenopathy/right-sided subclavicular lymph node.    April 17th -START FOLFOX; May 1st Add Avastin;   Aug 16th- Disc Nancy Clark]; con 5FU-Avastin; NOV 27th CT C/A/P- CR; except for 36m LUL; 5FU-Avastin q 3 W  # March 2018- L2 uptake MET s/p RT [[facility]2018]; mid April X-geva   # MOLECULAR TESTING: K-ras-EXON 2- MUTATED; MSI- STABLE.      Cancer of ascending colon metastatic to intra-abdominal lymph node ([place]   09/06/2015 Initial Diagnosis    Cancer of ascending colon metastatic to intra-abdominal lymph node (HCC)        HISTORY OF PRESENTING ILLNESS:  Nancy PERUSKI89y.o.  female  with a diagnosed Metastatic right-sided colon cancer status post 5-FU plus Avastin; Is here for follow-up/ To review the results of the restaging CAT scan.   Patient continues to complain of mild pain in the back since finishing radiation. She is not taking pain medication regular basis. She also noticed to have increase in diarrhea about once every day. She has been taking Imodium. However the tingling and numbness in her feet is not any better. She is on Neurontin/Cymbalta. Denies any headaches.No epistaxis. No swelling in the legs. Appetite is good. Weight is stable at 107 pounds.  ROS: A complete 10 point review of system is done which is negative except mentioned above in history of present illness  MEDICAL HISTORY:  Past Medical History:  Diagnosis Date  . Chicken pox   . [place]    Partial colectomy 07/2015 + chemo tx's  . Hypertension   . Hypokalemia   . Menopause    > 5 yrs  . Peripheral neuropathy due to chemotherapy ([facility]     SURGICAL  HISTORY: Past Surgical History:  Procedure Laterality Date  . BREAST BIOPSY Right    2007? pt unsure done by [facility]  . COLONOSCOPY    . COLONOSCOPY WITH PROPOFOL N/A 10/14/2015   Procedure: COLONOSCOPY WITH PROPOFOL;  Surgeon: PHulen Luster MD;  [facility];  Service: Endoscopy;  Laterality: N/A;  . COLOSTOMY REVISION Right 08/09/2015   Procedure: COLON RESECTION RIGHT;  Surgeon: RFlorene Glen MD;  Location: [facility] ORS;  Service: General;  Laterality: Right;  . ECTOPIC PREGNANCY SURGERY    . PORTACATH PLACEMENT N/A 09/01/2015   Procedure: INSERTION PORT-A-CATH;  Surgeon: RFlorene Glen MD;  Location: [facility] ORS;  Service: General;  Laterality: N/A;  . SHOULDER SURGERY Left     [place]  1 pack in 3 days; no alcohol. 2 childern [25 & 17 years] Social History   Social History  . Marital status: Married    Spouse name: N/A  . Number of children: N/A  . Years of education: N/A   Occupational History  . Not on file.   Social History Main Topics  . Smoking status: Former Smoker    Packs/day: 0.25    Years: 2.00  . Smokeless tobacco: Never Used     Comment: recently quit  . Alcohol use No  . Drug use: No  . Sexual activity: Yes   Other Topics Concern  . Not on file   Social History Narrative  .  No narrative on file    FAMILY HISTORY: 1 brother& 1 sister- no cancer. Mom-no cancer Family History  Problem Relation Age of Onset  . Hypertension Mother   . Arthritis Father   . Prostate cancer Father   . Diabetes Maternal Grandmother   . Colon cancer Maternal Grandfather        colon    ALLERGIES:  has No Known Allergies.  MEDICATIONS:  Current Outpatient Prescriptions  Medication Sig Dispense Refill  . acetaminophen (TYLENOL) 325 MG tablet Take 2 tablets (650 mg total) by mouth every 6 (six) hours as needed for mild pain (or Fever >/= 101).    . benazepril-hydrochlorthiazide (LOTENSIN HCT) 20-12.5 MG tablet Take 1  tablet by mouth daily. 60 tablet 3  . diphenoxylate-atropine (LOMOTIL) 2.5-0.025 MG tablet Take 1 tablet by mouth 4 (four) times daily as needed for diarrhea or loose stools. Take it along with immodium 40 tablet 0  . DULoxetine (CYMBALTA) 20 MG capsule Take 1 capsule (20 mg total) by mouth daily. 30 capsule 3  . feeding supplement (BOOST HIGH PROTEIN) LIQD Take 1 Container by mouth daily.    . ferrous sulfate (FERROUSUL) 325 (65 FE) MG tablet Take 1 tablet (325 mg total) by mouth daily with breakfast. 90 tablet 3  . gabapentin (NEURONTIN) 300 MG capsule One pill in am; and 2 pills at night prior to sleep. 90 capsule 3  . HYDROcodone-acetaminophen (NORCO/VICODIN) 5-325 MG tablet Take 1 tablet by mouth every 8 (eight) hours as needed for moderate pain. 40 tablet 0  . lidocaine-prilocaine (EMLA) cream Apply 1 application topically as needed. Apply generously over the Mediport 45 minutes prior to chemotherapy. 30 g 6  . loperamide (IMODIUM) 2 MG capsule Take 2 mg by mouth as needed for diarrhea or loose stools.    . Multiple Vitamins-Minerals (MULTIVITAMIN ADULT PO) Take 1 tablet by mouth daily.    . ondansetron (ZOFRAN) 8 MG tablet Take 1 tablet (8 mg total) by mouth every 8 (eight) hours as needed for nausea or vomiting (start 3 days; after chemo). 40 tablet 0  . potassium chloride SA (K-DUR,KLOR-CON) 20 MEQ tablet Take 2 tablets (40 mEq total) by mouth 2 (two) times daily. (Patient taking differently: Take 20 mEq by mouth daily. ) 60 tablet 3  . prochlorperazine (COMPAZINE) 10 MG tablet Take 1 tablet (10 mg total) by mouth every 6 (six) hours as needed for nausea or vomiting. 90 tablet 1   No current facility-administered medications for this visit.    Facility-Administered Medications Ordered in Other Visits  Medication Dose Route Frequency Provider Last Rate Last Dose  . fluorouracil (ADRUCIL) 3,350 mg in sodium chloride 0.9 % 83 mL chemo infusion  2,400 mg/m2 (Treatment Plan Recorded)  Intravenous 1 day or 1 dose Nancy Dalton R, MD   3,350 mg at 12/11/16 1140  . sodium chloride flush (NS) 0.9 % injection 10 mL  10 mL Intravenous PRN Cammie Sickle, MD   10 mL at 10/04/15 0901      .  PHYSICAL EXAMINATION: ECOG PERFORMANCE STATUS: 0 - Asymptomatic  Vitals:   12/11/16 0945  BP: 103/71  Resp: 16  Temp: (!) 96.2 F (35.7 C)   Filed Weights   12/11/16 0945  Weight: 108 lb 12.8 oz (49.4 kg)    GENERAL: Well-nourished well-developed; thin built; Alert, no distress and comfortable.   Accompanied by her mother.  EYES: no pallor or icterus OROPHARYNX: no thrush or ulceration; good dentition  NECK: supple, no  masses felt LYMPH:  no palpable lymphadenopathy in the cervical, axillary or inguinal regions LUNGS: clear to auscultation and  No wheeze or crackles HEART/CVS: regular rate & rhythm and no murmurs; No lower extremity edema ABDOMEN: abdomen soft, non-tender and normal bowel sounds; No infections. Musculoskeletal:no cyanosis of digits and no clubbing  PSYCH: alert & oriented x 3 with fluent speech NEURO: no focal motor/sensory deficits SKIN:  no rashes or significant lesions  LABORATORY DATA:  I have reviewed the data as listed Lab Results  Component Value Date   WBC 5.9 12/11/2016   HGB 12.3 12/11/2016   HCT 35.9 12/11/2016   MCV 93.6 12/11/2016   PLT 130 (L) 12/11/2016    Recent Labs  10/23/16 0811 11/13/16 0953 12/11/16 0902  NA 137 137 138  K 3.6 3.3* 3.3*  CL 105 107 111  CO2 27 22 22   GLUCOSE 91 127* 139*  BUN 15 12 13   CREATININE 0.82 0.80 0.87  CALCIUM 8.8* 8.5* 8.8*  GFRNONAA >60 >60 >60  GFRAA >60 >60 >60  PROT 6.4* 6.6 6.4*  ALBUMIN 3.5 3.7 3.6  AST 21 19 22   ALT 16 14 14   ALKPHOS 65 62 51  BILITOT 0.5 0.4 0.5    IMPRESSION: Stable sclerotic bone lesion in L2 vertebral body. Although atypical for colon carcinoma, bone metastasis cannot be excluded.  No other sites of metastatic disease  identified.  Aortic atherosclerosis.   Electronically Signed   By: Earle Gell M.D.   On: 12/01/2016 10:51   ASSESSMENT & PLAN:   .Cancer of ascending colon metastatic to intra-abdominal lymph node ([place]) # Colon cancer- cecal/ right-sided; STAGE IV [intraabdominal/retroperitonel LN/Supracla; K-ras mutated]. Currently on 5FU palliative chemotherapy+ avastin every 3 w. No obvious evidence of clinical progression. July 16th 2018- CT-NED; stable- L-2 bone lesion.   # proceed with chemotherapy today. Labs today reviewed;  acceptable for treatment today- except for- [see below] Mild thrombocytopenia. Again discussed that the treatments are palliative/indefinite.   # Thrombocytopenia platelets 130 today/- G-1. Monitor for now  # Clark- G-2-sec to prior oxaliplatin; Neurontin 300- 600 mg BID; continue cymblata 6m q day.   # symptomatic L2 bone lesion status post radiation finished May 2018.CT July 2018- STABLE.   # Mild hypokalemia- 3.2- recommend continued dietary supplementation.  # follow up on aug 15th/labs/chemo/cea [pt pref]; 5FU+avastin.   # I reviewed the blood work- with the patient in detail; also reviewed the imaging independently [as summarized above]; and with the patient in detail.      GCammie Sickle MD 12/11/2016 1:10 PM

## 2016-12-12 LAB — CEA: CEA: 4.2 ng/mL (ref 0.0–4.7)

## 2016-12-13 ENCOUNTER — Inpatient Hospital Stay: Payer: BLUE CROSS/BLUE SHIELD

## 2016-12-13 VITALS — BP 104/65 | HR 65 | Temp 96.4°F | Resp 18

## 2016-12-13 DIAGNOSIS — C772 Secondary and unspecified malignant neoplasm of intra-abdominal lymph nodes: Principal | ICD-10-CM

## 2016-12-13 DIAGNOSIS — C182 Malignant neoplasm of ascending colon: Secondary | ICD-10-CM | POA: Diagnosis not present

## 2016-12-13 MED ORDER — HEPARIN SOD (PORK) LOCK FLUSH 100 UNIT/ML IV SOLN
500.0000 [IU] | Freq: Once | INTRAVENOUS | Status: AC | PRN
  Administered 2016-12-13: 500 [IU]
  Filled 2016-12-13: qty 5

## 2016-12-13 MED ORDER — SODIUM CHLORIDE 0.9% FLUSH
10.0000 mL | INTRAVENOUS | Status: DC | PRN
Start: 2016-12-13 — End: 2016-12-13
  Filled 2016-12-13: qty 10

## 2016-12-21 ENCOUNTER — Telehealth: Payer: Self-pay | Admitting: *Deleted

## 2016-12-21 NOTE — Telephone Encounter (Signed)
Writer informed patient that Dr. Burlene Arnt said no to accupunture due to the risk of bleeding. Patient verbalized understanding.

## 2016-12-21 NOTE — Telephone Encounter (Signed)
Patient would like to know if it is OK for her to receive acupuncture. Please advise.

## 2016-12-21 NOTE — Telephone Encounter (Signed)
No, she has been tx recently with avastin, there is a risk for bleeding.

## 2017-01-03 ENCOUNTER — Inpatient Hospital Stay: Payer: BLUE CROSS/BLUE SHIELD

## 2017-01-03 ENCOUNTER — Inpatient Hospital Stay (HOSPITAL_BASED_OUTPATIENT_CLINIC_OR_DEPARTMENT_OTHER): Payer: BLUE CROSS/BLUE SHIELD | Admitting: Internal Medicine

## 2017-01-03 ENCOUNTER — Inpatient Hospital Stay: Payer: BLUE CROSS/BLUE SHIELD | Attending: Internal Medicine

## 2017-01-03 VITALS — BP 110/72 | HR 63 | Temp 96.7°F | Resp 16 | Wt 107.2 lb

## 2017-01-03 DIAGNOSIS — E876 Hypokalemia: Secondary | ICD-10-CM

## 2017-01-03 DIAGNOSIS — C182 Malignant neoplasm of ascending colon: Secondary | ICD-10-CM

## 2017-01-03 DIAGNOSIS — C7951 Secondary malignant neoplasm of bone: Secondary | ICD-10-CM | POA: Diagnosis not present

## 2017-01-03 DIAGNOSIS — C772 Secondary and unspecified malignant neoplasm of intra-abdominal lymph nodes: Secondary | ICD-10-CM | POA: Insufficient documentation

## 2017-01-03 DIAGNOSIS — I700 Atherosclerosis of aorta: Secondary | ICD-10-CM | POA: Diagnosis not present

## 2017-01-03 DIAGNOSIS — Z5112 Encounter for antineoplastic immunotherapy: Secondary | ICD-10-CM | POA: Diagnosis not present

## 2017-01-03 DIAGNOSIS — I10 Essential (primary) hypertension: Secondary | ICD-10-CM

## 2017-01-03 DIAGNOSIS — G620 Drug-induced polyneuropathy: Secondary | ICD-10-CM

## 2017-01-03 DIAGNOSIS — T451X5S Adverse effect of antineoplastic and immunosuppressive drugs, sequela: Secondary | ICD-10-CM | POA: Insufficient documentation

## 2017-01-03 DIAGNOSIS — C786 Secondary malignant neoplasm of retroperitoneum and peritoneum: Secondary | ICD-10-CM | POA: Insufficient documentation

## 2017-01-03 DIAGNOSIS — D696 Thrombocytopenia, unspecified: Secondary | ICD-10-CM | POA: Insufficient documentation

## 2017-01-03 DIAGNOSIS — Z800 Family history of malignant neoplasm of digestive organs: Secondary | ICD-10-CM | POA: Diagnosis not present

## 2017-01-03 DIAGNOSIS — Z923 Personal history of irradiation: Secondary | ICD-10-CM

## 2017-01-03 DIAGNOSIS — Z8042 Family history of malignant neoplasm of prostate: Secondary | ICD-10-CM | POA: Diagnosis not present

## 2017-01-03 DIAGNOSIS — Z87891 Personal history of nicotine dependence: Secondary | ICD-10-CM

## 2017-01-03 DIAGNOSIS — D599 Acquired hemolytic anemia, unspecified: Secondary | ICD-10-CM | POA: Diagnosis not present

## 2017-01-03 DIAGNOSIS — Z79899 Other long term (current) drug therapy: Secondary | ICD-10-CM

## 2017-01-03 DIAGNOSIS — R599 Enlarged lymph nodes, unspecified: Secondary | ICD-10-CM | POA: Diagnosis not present

## 2017-01-03 LAB — COMPREHENSIVE METABOLIC PANEL
ALT: 15 U/L (ref 14–54)
AST: 21 U/L (ref 15–41)
Albumin: 3.8 g/dL (ref 3.5–5.0)
Alkaline Phosphatase: 65 U/L (ref 38–126)
Anion gap: 7 (ref 5–15)
BUN: 14 mg/dL (ref 6–20)
CHLORIDE: 105 mmol/L (ref 101–111)
CO2: 25 mmol/L (ref 22–32)
CREATININE: 0.85 mg/dL (ref 0.44–1.00)
Calcium: 8.5 mg/dL — ABNORMAL LOW (ref 8.9–10.3)
Glucose, Bld: 93 mg/dL (ref 65–99)
Potassium: 3.4 mmol/L — ABNORMAL LOW (ref 3.5–5.1)
Sodium: 137 mmol/L (ref 135–145)
Total Bilirubin: 0.5 mg/dL (ref 0.3–1.2)
Total Protein: 6.8 g/dL (ref 6.5–8.1)

## 2017-01-03 LAB — CBC WITH DIFFERENTIAL/PLATELET
Basophils Absolute: 0 10*3/uL (ref 0–0.1)
Basophils Relative: 0 %
Eosinophils Absolute: 0.1 10*3/uL (ref 0–0.7)
Eosinophils Relative: 2 %
HCT: 36.3 % (ref 35.0–47.0)
HEMOGLOBIN: 12.3 g/dL (ref 12.0–16.0)
LYMPHS ABS: 1.3 10*3/uL (ref 1.0–3.6)
LYMPHS PCT: 24 %
MCH: 31.7 pg (ref 26.0–34.0)
MCHC: 33.8 g/dL (ref 32.0–36.0)
MCV: 93.6 fL (ref 80.0–100.0)
MONOS PCT: 12 %
Monocytes Absolute: 0.7 10*3/uL (ref 0.2–0.9)
NEUTROS ABS: 3.2 10*3/uL (ref 1.4–6.5)
NEUTROS PCT: 62 %
PLATELETS: 114 10*3/uL — AB (ref 150–440)
RBC: 3.88 MIL/uL (ref 3.80–5.20)
RDW: 16.5 % — AB (ref 11.5–14.5)
WBC: 5.3 10*3/uL (ref 3.6–11.0)

## 2017-01-03 MED ORDER — HEPARIN SOD (PORK) LOCK FLUSH 100 UNIT/ML IV SOLN: 500.0000 [IU] | Freq: Once | INTRAVENOUS | Status: DC

## 2017-01-03 MED ORDER — SODIUM CHLORIDE 0.9 % IV SOLN
5.0000 mg/kg | Freq: Once | INTRAVENOUS | Status: AC
  Administered 2017-01-03: 225 mg via INTRAVENOUS
  Filled 2017-01-03: qty 9

## 2017-01-03 MED ORDER — SODIUM CHLORIDE 0.9 % IV SOLN
Freq: Once | INTRAVENOUS | Status: AC
  Administered 2017-01-03: 10:00:00 via INTRAVENOUS
  Filled 2017-01-03: qty 1000

## 2017-01-03 MED ORDER — SODIUM CHLORIDE 0.9 % IV SOLN: Freq: Once | INTRAVENOUS | Status: DC

## 2017-01-03 MED ORDER — ONDANSETRON 8 MG PO TBDP
8.0000 mg | ORAL_TABLET | Freq: Once | ORAL | Status: AC
  Administered 2017-01-03: 8 mg via ORAL
  Filled 2017-01-03: qty 1

## 2017-01-03 MED ORDER — LEUCOVORIN CALCIUM INJECTION 100 MG
20.0000 mg/m2 | Freq: Once | INTRAMUSCULAR | Status: AC
  Administered 2017-01-03: 28 mg via INTRAVENOUS
  Filled 2017-01-03: qty 1.4

## 2017-01-03 MED ORDER — SODIUM CHLORIDE 0.9 % IV SOLN
2400.0000 mg/m2 | INTRAVENOUS | Status: DC
  Administered 2017-01-03: 3350 mg via INTRAVENOUS
  Filled 2017-01-03: qty 50

## 2017-01-03 MED ORDER — DEXTROSE 5 % IV SOLN
Freq: Once | INTRAVENOUS | Status: DC
  Filled 2017-01-03: qty 1000

## 2017-01-03 MED ORDER — DEXAMETHASONE SODIUM PHOSPHATE 10 MG/ML IJ SOLN
10.0000 mg | Freq: Once | INTRAMUSCULAR | Status: AC
Start: 2017-01-03 — End: 2017-01-03
  Administered 2017-01-03: 10 mg via INTRAVENOUS
  Filled 2017-01-03: qty 1

## 2017-01-03 MED ORDER — SODIUM CHLORIDE 0.9% FLUSH
10.0000 mL | INTRAVENOUS | Status: DC | PRN
  Administered 2017-01-03: 10 mL via INTRAVENOUS
  Filled 2017-01-03: qty 10

## 2017-01-03 NOTE — Progress Notes (Signed)
[place] CONSULT NOTE  Patient Care Team: Coral Spikes, DO as PCP - General (Family Medicine)  CHIEF COMPLAINTS/PURPOSE OF CONSULTATION:   Oncology History   # [facility] 2017- COLON CANCER- STAGE IV [s/p right hemi-colectomy; pT4apN2 (7/19LN)]; Pre-op CEA- [id number]-  PET-  mesenteric adenopathy/mediastinal adenopathy/right-sided subclavicular lymph node.    April 17th -START FOLFOX; May 1st Add Avastin;   Aug 16th- Disc [name] [place]]; con 5FU-Avastin; NOV 27th CT C/A/P- CR; except for 47m LUL; 5FU-Avastin q 3 W  # March 2018- L2 uptake MET s/p RT [[facility]2018]; mid April X-geva   # MOLECULAR TESTING: K-ras-EXON 2- MUTATED; MSI- STABLE.      [place]cer of ascending colon metastatic to [place]-abdominal lymph node [place]   09/06/2015 Initial Diagnosis    Cancer of ascending colon metastatic to intra-abdominal lymph node (HCC)        HISTORY OF PRESENTING ILLNESS:  Nancy ELLINGWOOD89y.o.  female  with a diagnosed Metastatic right-sided colon cancer status post 5-FU plus Avastin; Is here for follow-up.  Patient denies any worsening back pain. Her appetite is good. Denies any diarrhea. Continues to have tingling and numbness- continues to be Neurontin/Cymbalta- night it better or worse. Denies any headaches.No epistaxis. No swelling in the legs. Appetite is good. Weight is stable at 107 pounds.  ROS: A complete 10 point review of system is done which is negative except mentioned above in history of present illness  MEDICAL HISTORY:  Past Medical History:  Diagnosis Date  . Chicken pox   . [place] cancer [place]    Partial colectomy 07/2015 + chemo tx's  . Hypertension   . Hypokalemia   . Menopause    > 5 yrs  . Peripheral neuropathy due to chemotherapy ([facility]     SURGICAL HISTORY: Past Surgical History:  Procedure Laterality Date  . BREAST BIOPSY Right    2007? pt unsure done by [facility]  . COLONOSCOPY    . COLONOSCOPY WITH PROPOFOL N/A 10/14/2015   Procedure:  COLONOSCOPY WITH PROPOFOL;  Surgeon: PHulen Luster MD;  [facility];  Service: Endoscopy;  Laterality: N/A;  . COLOSTOMY REVISION Right 08/09/2015   Procedure: COLON RESECTION RIGHT;  Surgeon: RFlorene Glen MD;  Location: [facility] ORS;  Service: General;  Laterality: Right;  . ECTOPIC PREGNANCY SURGERY    . PORTACATH PLACEMENT N/A 09/01/2015   Procedure: INSERTION PORT-A-CATH;  Surgeon: RFlorene Glen MD;  Location: [facility] ORS;  Service: General;  Laterality: N/A;  . SHOULDER SURGERY Left     [place]  1 pack in 3 days; no alcohol. 2 childern [25 & 17 years] Social History   Social History  . Marital status: Married    Spouse name: N/A  . Number of children: N/A  . Years of education: N/A   Occupational History  . Not on file.   Social History Main Topics  . Smoking status: Former Smoker    Packs/day: 0.25    Years: 2.00  . Smokeless tobacco: Never Used     Comment: recently quit  . Alcohol use No  . Drug use: No  . Sexual activity: Yes   Other Topics Concern  . Not on file   Social History Narrative  . No narrative on file    FAMILY HISTORY: 1 brother& 1 sister- no cancer. Mom-no cancer Family History  Problem Relation Age of Onset  . Hypertension Mother   . Arthritis Father   .  Prostate cancer Father   . Diabetes Maternal Grandmother   . Colon cancer Maternal Grandfather        colon    ALLERGIES:  has No Known Allergies.  MEDICATIONS:  Current Outpatient Prescriptions  Medication Sig Dispense Refill  . acetaminophen (TYLENOL) 325 MG tablet Take 2 tablets (650 mg total) by mouth every 6 (six) hours as needed for mild pain (or Fever >/= 101).    . benazepril-hydrochlorthiazide (LOTENSIN HCT) 20-12.5 MG tablet Take 1 tablet by mouth daily. 60 tablet 3  . diphenoxylate-atropine (LOMOTIL) 2.5-0.025 MG tablet Take 1 tablet by mouth 4 (four) times daily as needed for diarrhea or loose stools. Take it along with  immodium 40 tablet 0  . DULoxetine (CYMBALTA) 20 MG capsule Take 1 capsule (20 mg total) by mouth daily. 30 capsule 3  . feeding supplement (BOOST HIGH PROTEIN) LIQD Take 1 Container by mouth daily.    . ferrous sulfate (FERROUSUL) 325 (65 FE) MG tablet Take 1 tablet (325 mg total) by mouth daily with breakfast. 90 tablet 3  . gabapentin (NEURONTIN) 300 MG capsule One pill in am; and 2 pills at night prior to sleep. 90 capsule 3  . HYDROcodone-acetaminophen (NORCO/VICODIN) 5-325 MG tablet Take 1 tablet by mouth every 8 (eight) hours as needed for moderate pain. 40 tablet 0  . lidocaine-prilocaine (EMLA) cream Apply 1 application topically as needed. Apply generously over the Mediport 45 minutes prior to chemotherapy. 30 g 6  . loperamide (IMODIUM) 2 MG capsule Take 2 mg by mouth as needed for diarrhea or loose stools.    . Multiple Vitamins-Minerals (MULTIVITAMIN ADULT PO) Take 1 tablet by mouth daily.    . ondansetron (ZOFRAN) 8 MG tablet Take 1 tablet (8 mg total) by mouth every 8 (eight) hours as needed for nausea or vomiting (start 3 days; after chemo). 40 tablet 0  . potassium chloride SA (K-DUR,KLOR-CON) 20 MEQ tablet Take 2 tablets (40 mEq total) by mouth 2 (two) times daily. (Patient taking differently: Take 20 mEq by mouth daily. ) 60 tablet 3  . prochlorperazine (COMPAZINE) 10 MG tablet Take 1 tablet (10 mg total) by mouth every 6 (six) hours as needed for nausea or vomiting. 90 tablet 1   No current facility-administered medications for this visit.    Facility-Administered Medications Ordered in Other Visits  Medication Dose Route Frequency Provider Last Rate Last Dose  . fluorouracil (ADRUCIL) 3,350 mg in sodium chloride 0.9 % 83 mL chemo infusion  2,400 mg/m2 (Treatment Plan Recorded) Intravenous 1 day or 1 dose Charlaine Dalton R, MD   3,350 mg at 01/03/17 1129  . heparin lock flush 100 unit/mL  500 Units Intravenous Once Charlaine Dalton R, MD      . sodium chloride flush (NS)  0.9 % injection 10 mL  10 mL Intravenous PRN Cammie Sickle, MD   10 mL at 10/04/15 0901  . sodium chloride flush (NS) 0.9 % injection 10 mL  10 mL Intravenous PRN Cammie Sickle, MD   10 mL at 01/03/17 0829      .  PHYSICAL EXAMINATION: ECOG PERFORMANCE STATUS: 0 - Asymptomatic  Vitals:   01/03/17 0852  BP: 110/72  Pulse: 63  Resp: 16  Temp: (!) 96.7 F (35.9 C)   Filed Weights   01/03/17 0852  Weight: 107 lb 3.2 oz (48.6 kg)    GENERAL: Well-nourished well-developed; thin built; Alert, no distress and comfortable.   Accompanied by her mother.  EYES: no pallor  or icterus OROPHARYNX: no thrush or ulceration; good dentition  NECK: supple, no masses felt LYMPH:  no palpable lymphadenopathy in the cervical, axillary or inguinal regions LUNGS: clear to auscultation and  No wheeze or crackles HEART/CVS: regular rate & rhythm and no murmurs; No lower extremity edema ABDOMEN: abdomen soft, non-tender and normal bowel sounds; No infections. Musculoskeletal:no cyanosis of digits and no clubbing  PSYCH: alert & oriented x 3 with fluent speech NEURO: no focal motor/sensory deficits SKIN:  no rashes or significant lesions  LABORATORY DATA:  I have reviewed the data as listed Lab Results  Component Value Date   WBC 5.3 01/03/2017   HGB 12.3 01/03/2017   HCT 36.3 01/03/2017   MCV 93.6 01/03/2017   PLT 114 (L) 01/03/2017    Recent Labs  11/13/16 0953 12/11/16 0902 01/03/17 0829  NA 137 138 137  K 3.3* 3.3* 3.4*  CL 107 111 105  CO2 22 22 25   GLUCOSE 127* 139* 93  BUN 12 13 14   CREATININE 0.80 0.87 0.85  CALCIUM 8.5* 8.8* 8.5*  GFRNONAA >60 >60 >60  GFRAA >60 >60 >60  PROT 6.6 6.4* 6.8  ALBUMIN 3.7 3.6 3.8  AST 19 22 21   ALT 14 14 15   ALKPHOS 62 51 65  BILITOT 0.4 0.5 0.5    IMPRESSION: Stable sclerotic bone lesion in L2 vertebral body. Although atypical for colon carcinoma, bone metastasis cannot be excluded.  No other sites of metastatic  disease identified.  Aortic atherosclerosis.   Electronically Signed   By: Earle Gell M.D.   On: 12/01/2016 10:51   ASSESSMENT & PLAN:   .Cancer of ascending colon metastatic to intra-abdominal lymph node ([place]) # Colon cancer- cecal/ right-sided; STAGE IV [intraabdominal/retroperitonel LN/Supracla; K-ras mutated]. Currently on 5FU palliative chemotherapy+ avastin every 3 w. No obvious evidence of clinical progression. July 16th 2018- CT-NED; stable- L-2 bone lesion.   # proceed with chemotherapy today. Labs today reviewed;  acceptable for treatment today- except for- [see below] Mild thrombocytopenia. Again discussed that the treatments are palliative/indefinite. Will order scans again in mid- sep-oct 2018.   # Thrombocytopenia platelets 114 today/- G-1. Monitor for now.   # PN- G-2-sec to prior oxaliplatin; Neurontin 300- 600 mg BID; continue cymblata 81m q day.   # symptomatic L2 bone lesion status post radiation finished May 2018.CT July 2018- STABLE.   # Mild hypokalemia- 3.4 recommend continued dietary supplementation.  # follow up in 3 weeks/labs/UA; chemo.      GCammie Sickle MD 01/03/2017 11:33 AM

## 2017-01-03 NOTE — Assessment & Plan Note (Addendum)
#  Colon cancer- cecal/ right-sided; STAGE IV [intraabdominal/retroperitonel LN/Supracla; K-ras mutated]. Currently on 5FU palliative chemotherapy+ avastin every 3 w. No obvious evidence of clinical progression. July 16th 2018- CT-NED; stable- L-2 bone lesion.   # proceed with chemotherapy today. Labs today reviewed;  acceptable for treatment today- except for- [see below] Mild thrombocytopenia. Again discussed that the treatments are palliative/indefinite. Will order scans again in mid- sep-oct 2018.   # Thrombocytopenia platelets 114 today/- G-1. Monitor for now.   # PN- G-2-sec to prior oxaliplatin; Neurontin 300- 600 mg BID; continue cymblata 15m q day.   # symptomatic L2 bone lesion status post radiation finished May 2018.CT July 2018- STABLE.   # Mild hypokalemia- 3.4 recommend continued dietary supplementation.  # follow up in 3 weeks/labs/UA; chemo.

## 2017-01-04 LAB — CEA: CEA: 3.7 ng/mL (ref 0.0–4.7)

## 2017-01-05 ENCOUNTER — Inpatient Hospital Stay: Payer: BLUE CROSS/BLUE SHIELD

## 2017-01-05 VITALS — BP 119/79 | HR 80 | Temp 97.9°F | Resp 18

## 2017-01-05 DIAGNOSIS — C182 Malignant neoplasm of ascending colon: Secondary | ICD-10-CM

## 2017-01-05 DIAGNOSIS — C772 Secondary and unspecified malignant neoplasm of intra-abdominal lymph nodes: Principal | ICD-10-CM

## 2017-01-05 MED ORDER — SODIUM CHLORIDE 0.9% FLUSH
10.0000 mL | INTRAVENOUS | Status: DC | PRN
  Administered 2017-01-05: 10 mL
  Filled 2017-01-05: qty 10

## 2017-01-05 MED ORDER — HEPARIN SOD (PORK) LOCK FLUSH 100 UNIT/ML IV SOLN
500.0000 [IU] | Freq: Once | INTRAVENOUS | Status: AC | PRN
  Administered 2017-01-05: 500 [IU]
  Filled 2017-01-05: qty 5

## 2017-01-10 ENCOUNTER — Other Ambulatory Visit: Payer: Self-pay | Admitting: *Deleted

## 2017-01-10 DIAGNOSIS — C182 Malignant neoplasm of ascending colon: Secondary | ICD-10-CM

## 2017-01-10 DIAGNOSIS — R197 Diarrhea, unspecified: Secondary | ICD-10-CM

## 2017-01-10 DIAGNOSIS — C772 Secondary and unspecified malignant neoplasm of intra-abdominal lymph nodes: Principal | ICD-10-CM

## 2017-01-10 MED ORDER — DIPHENOXYLATE-ATROPINE 2.5-0.025 MG PO TABS: 1.0000 | ORAL_TABLET | Freq: Four times a day (QID) | ORAL | 4 refills | Status: DC | PRN

## 2017-01-10 NOTE — Progress Notes (Signed)
Incoming fax from [facility] to request for renewal for pt's lomotil. New rx printed and [facility]ed to [facility].

## 2017-01-24 ENCOUNTER — Inpatient Hospital Stay (HOSPITAL_BASED_OUTPATIENT_CLINIC_OR_DEPARTMENT_OTHER): Payer: BLUE CROSS/BLUE SHIELD | Admitting: Internal Medicine

## 2017-01-24 ENCOUNTER — Inpatient Hospital Stay: Payer: BLUE CROSS/BLUE SHIELD

## 2017-01-24 ENCOUNTER — Inpatient Hospital Stay: Payer: BLUE CROSS/BLUE SHIELD | Attending: Internal Medicine

## 2017-01-24 VITALS — BP 110/70 | HR 84 | Temp 95.1°F | Resp 16 | Wt 109.4 lb

## 2017-01-24 DIAGNOSIS — G620 Drug-induced polyneuropathy: Secondary | ICD-10-CM | POA: Insufficient documentation

## 2017-01-24 DIAGNOSIS — Z923 Personal history of irradiation: Secondary | ICD-10-CM

## 2017-01-24 DIAGNOSIS — K0889 Other specified disorders of teeth and supporting structures: Secondary | ICD-10-CM | POA: Diagnosis not present

## 2017-01-24 DIAGNOSIS — C7951 Secondary malignant neoplasm of bone: Secondary | ICD-10-CM

## 2017-01-24 DIAGNOSIS — Z87891 Personal history of nicotine dependence: Secondary | ICD-10-CM | POA: Insufficient documentation

## 2017-01-24 DIAGNOSIS — C786 Secondary malignant neoplasm of retroperitoneum and peritoneum: Secondary | ICD-10-CM

## 2017-01-24 DIAGNOSIS — C772 Secondary and unspecified malignant neoplasm of intra-abdominal lymph nodes: Secondary | ICD-10-CM | POA: Insufficient documentation

## 2017-01-24 DIAGNOSIS — Z800 Family history of malignant neoplasm of digestive organs: Secondary | ICD-10-CM

## 2017-01-24 DIAGNOSIS — Z9221 Personal history of antineoplastic chemotherapy: Secondary | ICD-10-CM | POA: Diagnosis not present

## 2017-01-24 DIAGNOSIS — Z5111 Encounter for antineoplastic chemotherapy: Secondary | ICD-10-CM | POA: Insufficient documentation

## 2017-01-24 DIAGNOSIS — C182 Malignant neoplasm of ascending colon: Secondary | ICD-10-CM

## 2017-01-24 DIAGNOSIS — I10 Essential (primary) hypertension: Secondary | ICD-10-CM | POA: Insufficient documentation

## 2017-01-24 DIAGNOSIS — I700 Atherosclerosis of aorta: Secondary | ICD-10-CM | POA: Diagnosis not present

## 2017-01-24 DIAGNOSIS — E876 Hypokalemia: Secondary | ICD-10-CM

## 2017-01-24 DIAGNOSIS — T451X5S Adverse effect of antineoplastic and immunosuppressive drugs, sequela: Secondary | ICD-10-CM

## 2017-01-24 DIAGNOSIS — D696 Thrombocytopenia, unspecified: Secondary | ICD-10-CM

## 2017-01-24 DIAGNOSIS — Z79899 Other long term (current) drug therapy: Secondary | ICD-10-CM

## 2017-01-24 DIAGNOSIS — M549 Dorsalgia, unspecified: Secondary | ICD-10-CM | POA: Insufficient documentation

## 2017-01-24 LAB — COMPREHENSIVE METABOLIC PANEL
ALK PHOS: 61 U/L (ref 38–126)
ALT: 13 U/L — AB (ref 14–54)
AST: 16 U/L (ref 15–41)
Albumin: 3.6 g/dL (ref 3.5–5.0)
Anion gap: 7 (ref 5–15)
BUN: 9 mg/dL (ref 6–20)
CALCIUM: 8.5 mg/dL — AB (ref 8.9–10.3)
CHLORIDE: 105 mmol/L (ref 101–111)
CO2: 23 mmol/L (ref 22–32)
CREATININE: 0.74 mg/dL (ref 0.44–1.00)
Glucose, Bld: 92 mg/dL (ref 65–99)
Potassium: 3.2 mmol/L — ABNORMAL LOW (ref 3.5–5.1)
SODIUM: 135 mmol/L (ref 135–145)
Total Bilirubin: 0.3 mg/dL (ref 0.3–1.2)
Total Protein: 6.8 g/dL (ref 6.5–8.1)

## 2017-01-24 LAB — CBC WITH DIFFERENTIAL/PLATELET
BASOS ABS: 0 10*3/uL (ref 0–0.1)
Basophils Relative: 1 %
Eosinophils Absolute: 0.1 10*3/uL (ref 0–0.7)
Eosinophils Relative: 1 %
HCT: 35.6 % (ref 35.0–47.0)
HEMOGLOBIN: 12.1 g/dL (ref 12.0–16.0)
LYMPHS ABS: 1 10*3/uL (ref 1.0–3.6)
LYMPHS PCT: 19 %
MCH: 32 pg (ref 26.0–34.0)
MCHC: 34 g/dL (ref 32.0–36.0)
MCV: 94 fL (ref 80.0–100.0)
Monocytes Absolute: 0.6 10*3/uL (ref 0.2–0.9)
Monocytes Relative: 11 %
NEUTROS PCT: 68 %
Neutro Abs: 3.6 10*3/uL (ref 1.4–6.5)
PLATELETS: 139 10*3/uL — AB (ref 150–440)
RBC: 3.78 MIL/uL — AB (ref 3.80–5.20)
RDW: 16.5 % — ABNORMAL HIGH (ref 11.5–14.5)
WBC: 5.3 10*3/uL (ref 3.6–11.0)

## 2017-01-24 LAB — URINALYSIS, COMPLETE (UACMP) WITH MICROSCOPIC
BILIRUBIN URINE: NEGATIVE
Bacteria, UA: NONE SEEN
GLUCOSE, UA: NEGATIVE mg/dL
Hgb urine dipstick: NEGATIVE
KETONES UR: NEGATIVE mg/dL
LEUKOCYTES UA: NEGATIVE
NITRITE: NEGATIVE
PH: 5 (ref 5.0–8.0)
Protein, ur: NEGATIVE mg/dL
SPECIFIC GRAVITY, URINE: 1.008 (ref 1.005–1.030)

## 2017-01-24 MED ORDER — SODIUM CHLORIDE 0.9% FLUSH
10.0000 mL | INTRAVENOUS | Status: DC | PRN
  Filled 2017-01-24: qty 10

## 2017-01-24 MED ORDER — HEPARIN SOD (PORK) LOCK FLUSH 100 UNIT/ML IV SOLN: 500.0000 [IU] | Freq: Once | INTRAVENOUS | Status: DC

## 2017-01-24 MED ORDER — SODIUM CHLORIDE 0.9% FLUSH
10.0000 mL | INTRAVENOUS | Status: DC | PRN
  Administered 2017-01-24: 10 mL via INTRAVENOUS
  Filled 2017-01-24: qty 10

## 2017-01-24 MED ORDER — LEUCOVORIN CALCIUM INJECTION 100 MG
20.0000 mg/m2 | Freq: Once | INTRAMUSCULAR | Status: AC
  Administered 2017-01-24: 28 mg via INTRAVENOUS
  Filled 2017-01-24: qty 1.4

## 2017-01-24 MED ORDER — ONDANSETRON 8 MG PO TBDP
8.0000 mg | ORAL_TABLET | Freq: Once | ORAL | Status: AC
  Administered 2017-01-24: 8 mg via ORAL
  Filled 2017-01-24: qty 1

## 2017-01-24 MED ORDER — SODIUM CHLORIDE 0.9 % IV SOLN
2400.0000 mg/m2 | INTRAVENOUS | Status: DC
  Administered 2017-01-24: 3350 mg via INTRAVENOUS
  Filled 2017-01-24: qty 67

## 2017-01-24 NOTE — Progress Notes (Signed)
[facility] CONSULT NOTE  Patient Care Team: Coral Spikes, DO as PCP - General (Family Medicine)  CHIEF COMPLAINTS/PURPOSE OF CONSULTATION:   Oncology History   # [facility] 2017- COLON CANCER- STAGE IV [s/p right hemi-colectomy; pT4apN2 (7/19LN)]; Pre-op CEA- [id number]  PET-  mesenteric adenopathy/mediastinal adenopathy/right-sided subclavicular lymph node.    April 17th -START FOLFOX; May 1st Add Avastin;   Aug 16th- Disc [name] [place] PN]; con 5FU-Avastin; NOV 27th CT C/A/P- CR; except for 79m LUL; 5FU-Avastin q 3 W  # March 2018- L2 uptake MET s/p RT [[facility]]; mid April X-geva   # MOLECULAR TESTING: K-ras-EXON 2- MUTATED; MSI- STABLE.      Cancer of ascending colon [place]   09/06/2015 Initial Diagnosis    Cancer of ascending colon metastatic to intra-abdominal lymph node (HCC)        HISTORY OF PRESENTING ILLNESS:  Nancy SALEM89y.o.  female  with a diagnosed Metastatic right-sided colon cancer status post 5-FU plus Avastin; Is here for follow-up.   Denies any unusual headaches or epistaxis. Continues to have tingling and numbness of the extremities. She is on Neurontin/Cymbalta. This is neither getting better or worse. She is complaining of mild-moderate with pain. She is concerned about upcoming tooth extraction.  ROS: A complete 10 point review of system is done which is negative except mentioned above in history of present illness  MEDICAL HISTORY:  Past Medical History:  Diagnosis Date  . Chicken pox   . [place]    Partial colectomy 07/2015 + chemo tx's  . Hypertension   . Hypokalemia   . Menopause    > 5 yrs  . Peripheral neuropathy due to chemotherapy ([facility]     SURGICAL HISTORY: Past Surgical History:  Procedure Laterality Date  . BREAST BIOPSY Right    2007? pt unsure done by [facility]  . COLONOSCOPY    . COLONOSCOPY WITH PROPOFOL N/A 10/14/2015   Procedure: COLONOSCOPY WITH PROPOFOL;   Surgeon: PHulen Luster MD;  [facility];  Service: Endoscopy;  Laterality: N/A;  . COLOSTOMY REVISION Right 08/09/2015   Procedure: COLON RESECTION RIGHT;  Surgeon: RFlorene Glen MD;  Location: [facility] ORS;  Service: General;  Laterality: Right;  . ECTOPIC PREGNANCY SURGERY    . PORTACATH PLACEMENT N/A 09/01/2015   Procedure: INSERTION PORT-A-CATH;  Surgeon: RFlorene Glen MD;  Location: [facility] ORS;  Service: General;  Laterality: N/A;  . SHOULDER SURGERY Left     SOCIAL H[place]ORY[place]  1 pack in 3 days; no alcohol. 2 childern [25 & 17 years] Social History   Social History  . Marital status: Married    Spouse name: N/A  . Number of children: N/A  . Years of education: N/A   Occupational History  . Not on file.   Social History Main Topics  . Smoking status: Former Smoker    Packs/day: 0.25    Years: 2.00  . Smokeless tobacco: Never Used     Comment: recently quit  . Alcohol use No  . Drug use: No  . Sexual activity: Yes   Other Topics Concern  . Not on file   Social History Narrative  . No narrative on file    FAMILY HISTORY: 1 brother& 1 sister- no cancer. Mom-no cancer Family History  Problem Relation Age of Onset  . Hypertension Mother   . Arthritis Father   . Prostate cancer Father   .  Diabetes Maternal Grandmother   . Colon cancer Maternal Grandfather        colon    ALLERGIES:  has No Known Allergies.  MEDICATIONS:  Current Outpatient Prescriptions  Medication Sig Dispense Refill  . acetaminophen (TYLENOL) 325 MG tablet Take 2 tablets (650 mg total) by mouth every 6 (six) hours as needed for mild pain (or Fever >/= 101).    . benazepril-hydrochlorthiazide (LOTENSIN HCT) 20-12.5 MG tablet Take 1 tablet by mouth daily. 60 tablet 3  . diphenoxylate-atropine (LOMOTIL) 2.5-0.025 MG tablet Take 1 tablet by mouth 4 (four) times daily as needed for diarrhea or loose stools. Take it along with immodium 40 tablet 4  .  DULoxetine (CYMBALTA) 20 MG capsule Take 1 capsule (20 mg total) by mouth daily. 30 capsule 3  . feeding supplement (BOOST HIGH PROTEIN) LIQD Take 1 Container by mouth daily.    . ferrous sulfate (FERROUSUL) 325 (65 FE) MG tablet Take 1 tablet (325 mg total) by mouth daily with breakfast. 90 tablet 3  . gabapentin (NEURONTIN) 300 MG capsule One pill in am; and 2 pills at night prior to sleep. 90 capsule 3  . HYDROcodone-acetaminophen (NORCO/VICODIN) 5-325 MG tablet Take 1 tablet by mouth every 8 (eight) hours as needed for moderate pain. 40 tablet 0  . lidocaine-prilocaine (EMLA) cream Apply 1 application topically as needed. Apply generously over the Mediport 45 minutes prior to chemotherapy. 30 g 6  . loperamide (IMODIUM) 2 MG capsule Take 2 mg by mouth as needed for diarrhea or loose stools.    . Multiple Vitamins-Minerals (MULTIVITAMIN ADULT PO) Take 1 tablet by mouth daily.    . ondansetron (ZOFRAN) 8 MG tablet Take 1 tablet (8 mg total) by mouth every 8 (eight) hours as needed for nausea or vomiting (start 3 days; after chemo). 40 tablet 0  . potassium chloride SA (K-DUR,KLOR-CON) 20 MEQ tablet Take 2 tablets (40 mEq total) by mouth 2 (two) times daily. (Patient taking differently: Take 20 mEq by mouth daily. ) 60 tablet 3  . prochlorperazine (COMPAZINE) 10 MG tablet Take 1 tablet (10 mg total) by mouth every 6 (six) hours as needed for nausea or vomiting. 90 tablet 1   No current facility-administered medications for this visit.    Facility-Administered Medications Ordered in Other Visits  Medication Dose Route Frequency Provider Last Rate Last Dose  . sodium chloride flush (NS) 0.9 % injection 10 mL  10 mL Intravenous PRN Cammie Sickle, MD   10 mL at 10/04/15 0901      .  PHYSICAL EXAMINATION: ECOG PERFORMANCE STATUS: 0 - Asymptomatic  Vitals:   01/24/17 0944  BP: 110/70  Pulse: 84  Resp: 16  Temp: (!) 95.1 F (35.1 C)   Filed Weights   01/24/17 0944  Weight: 109 lb  6.4 oz (49.6 kg)    GENERAL: Well-nourished well-developed; thin built; Alert, no distress and comfortable.   Accompanied by her mother.  EYES: no pallor or icterus OROPHARYNX: no thrush or ulceration; good dentition  NECK: supple, no masses felt LYMPH:  no palpable lymphadenopathy in the cervical, axillary or inguinal regions LUNGS: clear to auscultation and  No wheeze or crackles HEART/CVS: regular rate & rhythm and no murmurs; No lower extremity edema ABDOMEN: abdomen soft, non-tender and normal bowel sounds; No infections. Musculoskeletal:no cyanosis of digits and no clubbing  PSYCH: alert & oriented x 3 with fluent speech NEURO: no focal motor/sensory deficits SKIN:  no rashes or significant lesions  LABORATORY DATA:  I have reviewed the data as listed Lab Results  Component Value Date   WBC 5.3 01/24/2017   HGB 12.1 01/24/2017   HCT 35.6 01/24/2017   MCV 94.0 01/24/2017   PLT 139 (L) 01/24/2017    Recent Labs  12/11/16 0902 01/03/17 0829 01/24/17 0919  NA 138 137 135  K 3.3* 3.4* 3.2*  CL 111 105 105  CO2 22 25 23   GLUCOSE 139* 93 92  BUN 13 14 9   CREATININE 0.87 0.85 0.74  CALCIUM 8.8* 8.5* 8.5*  GFRNONAA >60 >60 >60  GFRAA >60 >60 >60  PROT 6.4* 6.8 6.8  ALBUMIN 3.6 3.8 3.6  AST 22 21 16   ALT 14 15 13*  ALKPHOS 51 65 61  BILITOT 0.5 0.5 0.3    IMPRESSION: Stable sclerotic bone lesion in L2 vertebral body. Although atypical for colon carcinoma, bone metastasis cannot be excluded.  No other sites of metastatic disease identified.  Aortic atherosclerosis.   Electronically Signed   By: Earle Gell M.D.   On: 12/01/2016 10:51   ASSESSMENT & PLAN:   .Cancer of ascending colon metastatic to intra-abdominal lymph node ([place]) # Colon cancer- cecal/ right-sided; STAGE IV [intraabdominal/retroperitonel LN/Supracla; K-ras mutated]. Currently on 5FU palliative chemotherapy+ avastin every 3 w. No obvious evidence of clinical progression. July 16th  2018- CT-NED; stable- L-2 bone lesion.   # proceed with chemotherapy today 5FU+avastin. Labs today reviewed;  acceptable for treatment today- except for- [see below] Mild thrombocytopenia. Again discussed that the treatments are palliative/indefinite. Will order scans again in mid- sep-oct 2018.   # Thrombocytopenia platelets 138 today/- G-1. Monitor for now.   # tooth pain? Needing extraction; Hold avastin today. Will call prior to tooth extraction to repeat labs.   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   # symptomatic L2 bone lesion status post radiation finished May 2018.CT July 2018- STABLE.   # Mild hypokalemia- 3.2; recommend continued dietary supplementation.  # follow up in 3 weeks/labs/UA; chemo. HOLD avastin today.     GCammie Sickle MD 01/24/2017 6:57 PM

## 2017-01-24 NOTE — Assessment & Plan Note (Addendum)
#  Colon cancer- cecal/ right-sided; STAGE IV [intraabdominal/retroperitonel LN/Supracla; K-ras mutated]. Currently on 5FU palliative chemotherapy+ avastin every 3 w. No obvious evidence of clinical progression. July 16th 2018- CT-NED; stable- L-2 bone lesion.   # proceed with chemotherapy today 5FU+avastin. Labs today reviewed;  acceptable for treatment today- except for- [see below] Mild thrombocytopenia. Again discussed that the treatments are palliative/indefinite. Will order scans again in mid- sep-oct 2018.   # Thrombocytopenia platelets 138 today/- G-1. Monitor for now.   # tooth pain? Needing extraction; Hold avastin today. Will call prior to tooth extraction to repeat labs.   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   # symptomatic L2 bone lesion status post radiation finished May 2018.CT July 2018- STABLE.   # Mild hypokalemia- 3.2; recommend continued dietary supplementation.  # follow up in 3 weeks/labs/UA; chemo. HOLD avastin today.

## 2017-01-25 ENCOUNTER — Telehealth: Payer: Self-pay | Admitting: *Deleted

## 2017-01-25 LAB — CEA: CEA1: 4.4 ng/mL (ref 0.0–4.7)

## 2017-01-25 NOTE — Telephone Encounter (Signed)
Message given to Dr Rogue Bussing

## 2017-01-25 NOTE — Telephone Encounter (Signed)
Per Dr. Jacinto Reap. Patient had Avastin mid August. Patient would need to wait 1 week before any dental procedures

## 2017-01-25 NOTE — Telephone Encounter (Signed)
[facility] called stating they attempted a doctor to doctor call this morning, but could not get through. Patient is needing an extraction done and they need to speak with doctor to be sure it is alright to proceed. Please return her call [phone]

## 2017-01-25 NOTE — Telephone Encounter (Signed)
[facility] notified and will look at scheduling her the end of next week. I informed them that she will need labs drawn the day before her procedure

## 2017-01-26 ENCOUNTER — Inpatient Hospital Stay: Payer: BLUE CROSS/BLUE SHIELD

## 2017-01-26 VITALS — BP 118/77 | HR 70 | Temp 97.0°F | Resp 16

## 2017-01-26 DIAGNOSIS — C182 Malignant neoplasm of ascending colon: Secondary | ICD-10-CM

## 2017-01-26 DIAGNOSIS — C772 Secondary and unspecified malignant neoplasm of intra-abdominal lymph nodes: Principal | ICD-10-CM

## 2017-01-26 DIAGNOSIS — Z5111 Encounter for antineoplastic chemotherapy: Secondary | ICD-10-CM | POA: Diagnosis not present

## 2017-01-26 MED ORDER — HEPARIN SOD (PORK) LOCK FLUSH 100 UNIT/ML IV SOLN
500.0000 [IU] | Freq: Once | INTRAVENOUS | Status: AC | PRN
Start: 2017-01-26 — End: 2017-01-26
  Administered 2017-01-26: 500 [IU]
  Filled 2017-01-26: qty 5

## 2017-01-26 MED ORDER — SODIUM CHLORIDE 0.9% FLUSH
10.0000 mL | INTRAVENOUS | Status: DC | PRN
  Administered 2017-01-26: 10 mL
  Filled 2017-01-26: qty 10

## 2017-02-03 ENCOUNTER — Other Ambulatory Visit: Payer: Self-pay | Admitting: Internal Medicine

## 2017-02-12 ENCOUNTER — Inpatient Hospital Stay: Payer: BLUE CROSS/BLUE SHIELD

## 2017-02-12 ENCOUNTER — Inpatient Hospital Stay (HOSPITAL_BASED_OUTPATIENT_CLINIC_OR_DEPARTMENT_OTHER): Payer: BLUE CROSS/BLUE SHIELD | Admitting: Internal Medicine

## 2017-02-12 VITALS — BP 125/75 | HR 71 | Temp 98.1°F | Resp 16 | Wt 112.0 lb

## 2017-02-12 DIAGNOSIS — Z800 Family history of malignant neoplasm of digestive organs: Secondary | ICD-10-CM

## 2017-02-12 DIAGNOSIS — C786 Secondary malignant neoplasm of retroperitoneum and peritoneum: Secondary | ICD-10-CM | POA: Diagnosis not present

## 2017-02-12 DIAGNOSIS — D696 Thrombocytopenia, unspecified: Secondary | ICD-10-CM

## 2017-02-12 DIAGNOSIS — E876 Hypokalemia: Secondary | ICD-10-CM | POA: Diagnosis not present

## 2017-02-12 DIAGNOSIS — M549 Dorsalgia, unspecified: Secondary | ICD-10-CM

## 2017-02-12 DIAGNOSIS — C182 Malignant neoplasm of ascending colon: Secondary | ICD-10-CM

## 2017-02-12 DIAGNOSIS — C772 Secondary and unspecified malignant neoplasm of intra-abdominal lymph nodes: Secondary | ICD-10-CM

## 2017-02-12 DIAGNOSIS — T451X5S Adverse effect of antineoplastic and immunosuppressive drugs, sequela: Secondary | ICD-10-CM | POA: Diagnosis not present

## 2017-02-12 DIAGNOSIS — Z923 Personal history of irradiation: Secondary | ICD-10-CM

## 2017-02-12 DIAGNOSIS — Z5111 Encounter for antineoplastic chemotherapy: Secondary | ICD-10-CM | POA: Diagnosis not present

## 2017-02-12 DIAGNOSIS — Z9221 Personal history of antineoplastic chemotherapy: Secondary | ICD-10-CM

## 2017-02-12 DIAGNOSIS — C7951 Secondary malignant neoplasm of bone: Secondary | ICD-10-CM | POA: Diagnosis not present

## 2017-02-12 DIAGNOSIS — Z79899 Other long term (current) drug therapy: Secondary | ICD-10-CM

## 2017-02-12 DIAGNOSIS — G620 Drug-induced polyneuropathy: Secondary | ICD-10-CM | POA: Diagnosis not present

## 2017-02-12 DIAGNOSIS — Z87891 Personal history of nicotine dependence: Secondary | ICD-10-CM

## 2017-02-12 DIAGNOSIS — K0889 Other specified disorders of teeth and supporting structures: Secondary | ICD-10-CM

## 2017-02-12 DIAGNOSIS — I10 Essential (primary) hypertension: Secondary | ICD-10-CM

## 2017-02-12 DIAGNOSIS — I700 Atherosclerosis of aorta: Secondary | ICD-10-CM

## 2017-02-12 LAB — CBC WITH DIFFERENTIAL/PLATELET
BASOS ABS: 0 10*3/uL (ref 0–0.1)
Basophils Relative: 1 %
EOS ABS: 0.1 10*3/uL (ref 0–0.7)
EOS PCT: 2 %
HEMATOCRIT: 34.8 % — AB (ref 35.0–47.0)
Hemoglobin: 11.9 g/dL — ABNORMAL LOW (ref 12.0–16.0)
LYMPHS PCT: 26 %
Lymphs Abs: 1.1 10*3/uL (ref 1.0–3.6)
MCH: 32.3 pg (ref 26.0–34.0)
MCHC: 34.1 g/dL (ref 32.0–36.0)
MCV: 94.6 fL (ref 80.0–100.0)
MONO ABS: 0.5 10*3/uL (ref 0.2–0.9)
Monocytes Relative: 11 %
Neutro Abs: 2.7 10*3/uL (ref 1.4–6.5)
Neutrophils Relative %: 60 %
PLATELETS: 120 10*3/uL — AB (ref 150–440)
RBC: 3.68 MIL/uL — AB (ref 3.80–5.20)
RDW: 16.3 % — AB (ref 11.5–14.5)
WBC: 4.5 10*3/uL (ref 3.6–11.0)

## 2017-02-12 LAB — URINALYSIS, COMPLETE (UACMP) WITH MICROSCOPIC
BILIRUBIN URINE: NEGATIVE
Bacteria, UA: NONE SEEN
GLUCOSE, UA: NEGATIVE mg/dL
HGB URINE DIPSTICK: NEGATIVE
KETONES UR: NEGATIVE mg/dL
LEUKOCYTES UA: NEGATIVE
NITRITE: NEGATIVE
PROTEIN: NEGATIVE mg/dL
RBC / HPF: NONE SEEN RBC/hpf (ref 0–5)
Specific Gravity, Urine: 1.01 (ref 1.005–1.030)
WBC UA: NONE SEEN WBC/hpf (ref 0–5)
pH: 5 (ref 5.0–8.0)

## 2017-02-12 LAB — COMPREHENSIVE METABOLIC PANEL
ALT: 15 U/L (ref 14–54)
AST: 21 U/L (ref 15–41)
Albumin: 3.5 g/dL (ref 3.5–5.0)
Alkaline Phosphatase: 59 U/L (ref 38–126)
Anion gap: 7 (ref 5–15)
BILIRUBIN TOTAL: 0.4 mg/dL (ref 0.3–1.2)
BUN: 16 mg/dL (ref 6–20)
CHLORIDE: 105 mmol/L (ref 101–111)
CO2: 25 mmol/L (ref 22–32)
CREATININE: 0.81 mg/dL (ref 0.44–1.00)
Calcium: 8.4 mg/dL — ABNORMAL LOW (ref 8.9–10.3)
GFR calc non Af Amer: >60 mL/min (ref >60)
Glucose, Bld: 76 mg/dL (ref 65–99)
Potassium: 2.9 mmol/L — ABNORMAL LOW (ref 3.5–5.1)
Sodium: 137 mmol/L (ref 135–145)
TOTAL PROTEIN: 6.4 g/dL — AB (ref 6.5–8.1)

## 2017-02-12 MED ORDER — DULOXETINE HCL 20 MG PO CPEP: 40.0000 mg | ORAL_CAPSULE | Freq: Every day | ORAL | 2 refills | Status: DC

## 2017-02-12 MED ORDER — HYDROCODONE-ACETAMINOPHEN 5-325 MG PO TABS: 1.0000 | ORAL_TABLET | Freq: Three times a day (TID) | ORAL | 0 refills | Status: DC | PRN

## 2017-02-12 MED ORDER — POTASSIUM CHLORIDE 10 MEQ/100ML IV SOLN
10.0000 meq | INTRAVENOUS | Status: AC
  Filled 2017-02-12: qty 100

## 2017-02-12 MED ORDER — SODIUM CHLORIDE 0.9 % IV SOLN
2400.0000 mg/m2 | INTRAVENOUS | Status: DC
  Administered 2017-02-12: 3350 mg via INTRAVENOUS
  Filled 2017-02-12: qty 67

## 2017-02-12 MED ORDER — POTASSIUM CHLORIDE CRYS ER 20 MEQ PO TBCR: 40.0000 meq | EXTENDED_RELEASE_TABLET | Freq: Every day | ORAL | 2 refills | Status: DC

## 2017-02-12 MED ORDER — LEUCOVORIN CALCIUM INJECTION 350 MG: 400.0000 mg/m2 | Freq: Once | INTRAVENOUS | Status: DC

## 2017-02-12 MED ORDER — SODIUM CHLORIDE 0.9 % IV SOLN
Freq: Once | INTRAVENOUS | Status: AC
  Administered 2017-02-12: 8 mg via INTRAVENOUS
  Filled 2017-02-12: qty 4

## 2017-02-12 MED ORDER — LEUCOVORIN CALCIUM INJECTION 100 MG
20.0000 mg/m2 | Freq: Once | INTRAMUSCULAR | Status: AC
  Administered 2017-02-12: 28 mg via INTRAVENOUS
  Filled 2017-02-12: qty 1.4

## 2017-02-12 MED ORDER — POTASSIUM CHLORIDE 20 MEQ/100ML IV SOLN
20.0000 meq | Freq: Once | INTRAVENOUS | Status: AC
  Administered 2017-02-12: 20 meq via INTRAVENOUS
  Filled 2017-02-12: qty 100

## 2017-02-12 NOTE — Assessment & Plan Note (Addendum)
#  Colon cancer- cecal/ right-sided; STAGE IV [intraabdominal/retroperitonel LN/Supracla; K-ras mutated]. Currently on 5FU palliative chemotherapy+ avastin every 3 w. No obvious evidence of clinical progression. July 16th 2018- CT-NED; stable- L-2 bone lesion.   # Labs today reviewed;  acceptable for treatment today- except for- [see below] Mild thrombocytopenia. Again discussed that the treatments are palliative/indefinite. Will order scans again in oct 2018.   # Thrombocytopenia platelets 120 today/- G-1. Monitor for now.   # tooth pain. She has planned extraction for Friday. Will HOLD avastin today so that she may proceed with extraction.   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  # symptomatic L2 bone lesion status post radiation finished May 2018.CT July 2018- STABLE. Hydrocodone for pain. Refill provided today.  # Mild hypokalemia- 3.2; recommend continued dietary supplementation.  # follow up in 3 weeks/labs/UA; chemo. HOLD avastin today.

## 2017-02-12 NOTE — Progress Notes (Signed)
[facility] CONSULT NOTE  Patient Care Team: Coral Spikes, DO as PCP - General (Family Medicine)  CHIEF COMPLAINTS/PURPOSE OF CONSULTATION:   Oncology History   # [facility] 2017- COLON CANCER- STAGE IV [s/p right hemi-colectomy; pT4apN2 (7/19LN)]; Pre-op CEA- [id number]  PET-  mesenteric adenopathy/mediastinal adenopathy/right-sided subclavicular lymph node.    April 17th -START FOLFOX; May 1st Add Avastin;   Aug 16th- Disc [name] [place] PN]; con 5FU-Avastin; NOV 27th CT C/A/P- CR; except for 43m LUL; 5FU-Avastin q 3 W  # March 2018- L2 uptake MET s/p RT [[facility]]; mid April X-geva   # MOLECULAR TESTING: K-ras-EXON 2- MUTATED; MSI- STABLE.      Cancer of ascending colon metastatic to [place]-abdominal lymph node ([place]   09/06/2015 Initial Diagnosis    Cancer of ascending colon metastatic to intra-abdominal lymph node (HCC)        HISTORY OF PRESENTING ILLNESS:  Nancy [id number].o.  female  with a diagnosed Metastatic right-sided colon cancer status post 5-FU plus Avastin; Is here for follow-up.   She continues to complain of numbness in the soles of her feet. It has not progressed or improved. 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 Continues neurontin and cymbalta '20mg'$  daily. She complains of mild-moderate pain but it does not interfere with her sleep. She continues to have some back pain which is controlled with hydrocodone prn. She requests a refill today. Denies constipation. Denies falls or impacts on her ADLs.   Previously she had hypokalemia. She continues oral supplementation 20 meq daily and attempts to consume potassium rich foods.   She has a scheduled tooth extraction for Friday and requests clearance. Denies episodes of bleeding in setting of thrombocytopenia.   ROS: A complete 10 point review of system is done which is negative except mentioned above in history of present illness  MEDICAL HISTORY:  Past Medical History:  Diagnosis Date  . Chicken  pox   . Colon cancer ([place]    Partial colectomy 07/2015 + chemo tx's  . Hypertension   . Hypokalemia   . Menopause    > 5 yrs  . Peripheral neuropathy due to chemotherapy ([facility]     SURGICAL HISTORY: Past Surgical History:  Procedure Laterality Date  . BREAST BIOPSY Right    2007? pt unsure done by [facility]  . COLONOSCOPY    . COLONOSCOPY WITH PROPOFOL N/A 10/14/2015   Procedure: COLONOSCOPY WITH PROPOFOL;  Surgeon: PHulen Luster MD;  [facility];  Service: Endoscopy;  Laterality: N/A;  . COLOSTOMY REVISION Right 08/09/2015   Procedure: COLON RESECTION RIGHT;  Surgeon: RFlorene Glen MD;  Location: [facility] ORS;  Service: General;  Laterality: Right;  . ECTOPIC PREGNANCY SURGERY    . PORTACATH PLACEMENT N/A 09/01/2015   Procedure: INSERTION PORT-A-CATH;  Surgeon: RFlorene Glen MD;  Location: [facility] ORS;  Service: General;  Laterality: N/A;  . SHOULDER SURGERY Left     SOCIAL H[place]  1 pack in 3 days; no alcohol. 2 childern [25 & 17 years] Social History   Social History  . Marital status: Married    Spouse name: N/A  . Number of children: N/A  . Years of education: N/A   Occupational History  . Not on file.   Social History Main Topics  . Smoking status: Former Smoker    Packs/day: 0.25    Years: 2.00  . Smokeless tobacco: Never Used     Comment: recently quit  .  Alcohol use No  . Drug use: No  . Sexual activity: Yes   Other Topics Concern  . Not on file   Social History Narrative  . No narrative on file    FAMILY HISTORY: 1 brother& 1 sister- no cancer. Mom-no cancer Family History  Problem Relation Age of Onset  . Hypertension Mother   . Arthritis Father   . Prostate cancer Father   . Diabetes Maternal Grandmother   . Colon cancer Maternal Grandfather        colon    ALLERGIES:  has No Known Allergies.  MEDICATIONS:  Current Outpatient Prescriptions  Medication Sig Dispense Refill  . acetaminophen  (TYLENOL) 325 MG tablet Take 2 tablets (650 mg total) by mouth every 6 (six) hours as needed for mild pain (or Fever >/= 101).    . benazepril-hydrochlorthiazide (LOTENSIN HCT) 20-12.5 MG tablet Take 1 tablet by mouth daily. 60 tablet 3  . diphenoxylate-atropine (LOMOTIL) 2.5-0.025 MG tablet Take 1 tablet by mouth 4 (four) times daily as needed for diarrhea or loose stools. Take it along with immodium 40 tablet 4  . feeding supplement (BOOST HIGH PROTEIN) LIQD Take 1 Container by mouth daily.    . ferrous sulfate (FERROUSUL) 325 (65 FE) MG tablet Take 1 tablet (325 mg total) by mouth daily with breakfast. 90 tablet 3  . gabapentin (NEURONTIN) 300 MG capsule One pill in am; and 2 pills at night prior to sleep. 90 capsule 3  . HYDROcodone-acetaminophen (NORCO/VICODIN) 5-325 MG tablet Take 1 tablet by mouth every 8 (eight) hours as needed for moderate pain. 40 tablet 0  . lidocaine-prilocaine (EMLA) cream Apply 1 application topically as needed. Apply generously over the Mediport 45 minutes prior to chemotherapy. 30 g 6  . loperamide (IMODIUM) 2 MG capsule Take 2 mg by mouth as needed for diarrhea or loose stools.    . Multiple Vitamins-Minerals (MULTIVITAMIN ADULT PO) Take 1 tablet by mouth daily.    . ondansetron (ZOFRAN) 8 MG tablet Take 1 tablet (8 mg total) by mouth every 8 (eight) hours as needed for nausea or vomiting (start 3 days; after chemo). 40 tablet 0  . prochlorperazine (COMPAZINE) 10 MG tablet Take 1 tablet (10 mg total) by mouth every 6 (six) hours as needed for nausea or vomiting. 90 tablet 1  . DULoxetine (CYMBALTA) 20 MG capsule Take 2 capsules (40 mg total) by mouth daily. 60 capsule 2  . potassium chloride SA (K-DUR,KLOR-CON) 20 MEQ tablet Take 2 tablets (40 mEq total) by mouth daily. 60 tablet 2   No current facility-administered medications for this visit.    Facility-Administered Medications Ordered in Other Visits  Medication Dose Route Frequency Provider Last Rate Last Dose   . fluorouracil (ADRUCIL) 3,350 mg in sodium chloride 0.9 % 83 mL chemo infusion  2,400 mg/m2 (Treatment Plan Recorded) Intravenous 1 day or 1 dose Charlaine Dalton R, MD      . leucovorin injection 28 mg  20 mg/m2 (Treatment Plan Recorded) Intravenous Once Cammie Sickle, MD      . ondansetron (ZOFRAN) 8 mg, dexamethasone (DECADRON) 10 mg in sodium chloride 0.9 % 50 mL IVPB   Intravenous Once Charlaine Dalton R, MD      . potassium chloride 10 mEq in 100 mL IVPB  10 mEq Intravenous Q1 Hr x 2 Verlon Au, NP      . potassium chloride 20 mEq in 100 mL IVPB  20 mEq Intravenous Once Verlon Au, NP  Stopped at 02/12/17 1332  . sodium chloride flush (NS) 0.9 % injection 10 mL  10 mL Intravenous PRN Cammie Sickle, MD   10 mL at 10/04/15 0901      PHYSICAL EXAMINATION: ECOG PERFORMANCE STATUS: 0 - Asymptomatic  Vitals:   02/12/17 0957  BP: 125/75  Pulse: 71  Resp: 16  Temp: 98.1 F (36.7 C)   Filed Weights   02/12/17 0957  Weight: 112 lb (50.8 kg)    GENERAL: Well-nourished well-developed; thin built; Alert, no distress and comfortable.   Accompanied by her mother.  EYES: no pallor or icterus OROPHARYNX: no thrush or ulceration NECK: supple, no masses felt LYMPH:  no palpable lymphadenopathy in the cervical, axillary or inguinal regions LUNGS: clear to auscultation and  No wheeze or crackles HEART/CVS: regular rate & rhythm and no murmurs; No lower extremity edema ABDOMEN: abdomen soft, non-tender and normal bowel sounds; No infections. Musculoskeletal:no cyanosis of digits and no clubbing  PSYCH: alert & oriented x 3 with fluent speech NEURO: no focal motor/sensory deficits SKIN:  no rashes or significant lesions  LABORATORY DATA:  I have reviewed the data as listed Lab Results  Component Value Date   WBC 4.5 02/12/2017   HGB 11.9 (L) 02/12/2017   HCT 34.8 (L) 02/12/2017   MCV 94.6 02/12/2017   PLT 120 (L) 02/12/2017    Recent Labs   01/03/17 0829 01/24/17 0919 02/12/17 0922  NA 137 135 137  K 3.4* 3.2* 2.9*  CL 105 105 105  CO2 _0 GLUCOSE 93 92 76  BUN _1 CREATININE 0.85 0.74 0.81  CALCIUM 8.5* 8.5* 8.4*  GFRNONAA >60 >60 >60  GFRAA >60 >60 >60  PROT 6.8 6.8 6.4*  ALBUMIN 3.8 3.6 3.5  AST _2 ALT 15 13* 15  ALKPHOS 65 61 59  BILITOT 0.5 0.3 0.4    IMPRESSION: Stable sclerotic bone lesion in L2 vertebral body. Although atypical for colon carcinoma, bone metastasis cannot be excluded.  No other sites of metastatic disease identified.  Aortic atherosclerosis.  Electronically Signed   By: Earle Gell M.D.   On: 12/01/2016 10:51   ASSESSMENT & PLAN:   .Cancer of ascending colon metastatic to intra-abdominal lymph node ([place]) # Colon cancer- cecal/ right-sided; STAGE IV [intraabdominal/retroperitonel LN/Supracla; K-ras mutated]. Currently on 5FU palliative chemotherapy+ avastin every 3 w. No obvious evidence of clinical progression. July 16th 2018- CT-NED; stable- L-2 bone lesion.   # Labs today reviewed;  acceptable for treatment today- except for- [see below] Mild thrombocytopenia. Again discussed that the treatments are palliative/indefinite. Will order scans again in oct 2018.   # Thrombocytopenia platelets 120 today/- G-1. Monitor for now.   # tooth pain. She has planned extraction for Friday. Will HOLD avastin today so that she may proceed with extraction.   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  # symptomatic L2 bone lesion status post radiation finished May 2018.CT July 2018- STABLE. Hydrocodone for pain. Refill provided today.  # Mild hypokalemia- 3.2; recommend continued dietary supplementation.  # follow up in 3 weeks/labs/UA; chemo. HOLD avastin today.   Tamaj Jurgens G. AZenia Resides NP 02/12/17 12:34 PM   LVerlon Au NP 02/12/2017 12:34 PM

## 2017-02-14 ENCOUNTER — Ambulatory Visit: Payer: BLUE CROSS/BLUE SHIELD

## 2017-02-14 ENCOUNTER — Ambulatory Visit: Payer: BLUE CROSS/BLUE SHIELD | Admitting: Internal Medicine

## 2017-02-14 ENCOUNTER — Other Ambulatory Visit: Payer: BLUE CROSS/BLUE SHIELD

## 2017-02-14 ENCOUNTER — Inpatient Hospital Stay: Payer: BLUE CROSS/BLUE SHIELD

## 2017-02-14 VITALS — BP 125/73 | HR 60 | Temp 96.0°F | Resp 16

## 2017-02-14 DIAGNOSIS — Z5111 Encounter for antineoplastic chemotherapy: Secondary | ICD-10-CM | POA: Diagnosis not present

## 2017-02-14 DIAGNOSIS — C772 Secondary and unspecified malignant neoplasm of intra-abdominal lymph nodes: Principal | ICD-10-CM

## 2017-02-14 DIAGNOSIS — C182 Malignant neoplasm of ascending colon: Secondary | ICD-10-CM

## 2017-02-14 MED ORDER — HEPARIN SOD (PORK) LOCK FLUSH 100 UNIT/ML IV SOLN
500.0000 [IU] | Freq: Once | INTRAVENOUS | Status: AC | PRN
Start: 2017-02-14 — End: 2017-02-14
  Administered 2017-02-14: 500 [IU]
  Filled 2017-02-14: qty 5

## 2017-02-14 MED ORDER — SODIUM CHLORIDE 0.9% FLUSH
10.0000 mL | INTRAVENOUS | Status: DC | PRN
  Administered 2017-02-14: 10 mL
  Filled 2017-02-14: qty 10

## 2017-02-21 ENCOUNTER — Ambulatory Visit: Payer: BLUE CROSS/BLUE SHIELD

## 2017-02-21 ENCOUNTER — Ambulatory Visit: Payer: BLUE CROSS/BLUE SHIELD | Admitting: Internal Medicine

## 2017-02-21 ENCOUNTER — Other Ambulatory Visit: Payer: BLUE CROSS/BLUE SHIELD

## 2017-03-05 ENCOUNTER — Inpatient Hospital Stay: Payer: BLUE CROSS/BLUE SHIELD

## 2017-03-05 ENCOUNTER — Inpatient Hospital Stay: Payer: BLUE CROSS/BLUE SHIELD | Attending: Internal Medicine

## 2017-03-05 ENCOUNTER — Inpatient Hospital Stay (HOSPITAL_BASED_OUTPATIENT_CLINIC_OR_DEPARTMENT_OTHER): Payer: BLUE CROSS/BLUE SHIELD | Admitting: Internal Medicine

## 2017-03-05 VITALS — BP 99/66 | HR 68 | Temp 98.1°F | Resp 16 | Wt 111.8 lb

## 2017-03-05 DIAGNOSIS — C772 Secondary and unspecified malignant neoplasm of intra-abdominal lymph nodes: Secondary | ICD-10-CM

## 2017-03-05 DIAGNOSIS — I10 Essential (primary) hypertension: Secondary | ICD-10-CM | POA: Insufficient documentation

## 2017-03-05 DIAGNOSIS — I700 Atherosclerosis of aorta: Secondary | ICD-10-CM | POA: Insufficient documentation

## 2017-03-05 DIAGNOSIS — Z87891 Personal history of nicotine dependence: Secondary | ICD-10-CM | POA: Diagnosis not present

## 2017-03-05 DIAGNOSIS — Z23 Encounter for immunization: Secondary | ICD-10-CM | POA: Insufficient documentation

## 2017-03-05 DIAGNOSIS — M545 Low back pain: Secondary | ICD-10-CM | POA: Diagnosis not present

## 2017-03-05 DIAGNOSIS — C182 Malignant neoplasm of ascending colon: Secondary | ICD-10-CM

## 2017-03-05 DIAGNOSIS — Z79899 Other long term (current) drug therapy: Secondary | ICD-10-CM | POA: Diagnosis not present

## 2017-03-05 DIAGNOSIS — D696 Thrombocytopenia, unspecified: Secondary | ICD-10-CM

## 2017-03-05 DIAGNOSIS — R197 Diarrhea, unspecified: Secondary | ICD-10-CM

## 2017-03-05 DIAGNOSIS — Z8042 Family history of malignant neoplasm of prostate: Secondary | ICD-10-CM | POA: Diagnosis not present

## 2017-03-05 DIAGNOSIS — Z800 Family history of malignant neoplasm of digestive organs: Secondary | ICD-10-CM | POA: Diagnosis not present

## 2017-03-05 DIAGNOSIS — E876 Hypokalemia: Secondary | ICD-10-CM | POA: Diagnosis not present

## 2017-03-05 DIAGNOSIS — G620 Drug-induced polyneuropathy: Secondary | ICD-10-CM

## 2017-03-05 DIAGNOSIS — Z923 Personal history of irradiation: Secondary | ICD-10-CM | POA: Insufficient documentation

## 2017-03-05 DIAGNOSIS — Z5112 Encounter for antineoplastic immunotherapy: Secondary | ICD-10-CM | POA: Diagnosis not present

## 2017-03-05 DIAGNOSIS — T451X5S Adverse effect of antineoplastic and immunosuppressive drugs, sequela: Secondary | ICD-10-CM | POA: Diagnosis not present

## 2017-03-05 DIAGNOSIS — K0889 Other specified disorders of teeth and supporting structures: Secondary | ICD-10-CM | POA: Diagnosis not present

## 2017-03-05 LAB — COMPREHENSIVE METABOLIC PANEL
ALBUMIN: 3.5 g/dL (ref 3.5–5.0)
ALK PHOS: 58 U/L (ref 38–126)
ALT: 12 U/L — AB (ref 14–54)
AST: 18 U/L (ref 15–41)
Anion gap: 9 (ref 5–15)
BUN: 9 mg/dL (ref 6–20)
CALCIUM: 8.5 mg/dL — AB (ref 8.9–10.3)
CHLORIDE: 104 mmol/L (ref 101–111)
CO2: 24 mmol/L (ref 22–32)
CREATININE: 0.73 mg/dL (ref 0.44–1.00)
GFR calc Af Amer: >60 mL/min (ref >60)
GFR calc non Af Amer: >60 mL/min (ref >60)
GLUCOSE: 96 mg/dL (ref 65–99)
Potassium: 3.2 mmol/L — ABNORMAL LOW (ref 3.5–5.1)
SODIUM: 137 mmol/L (ref 135–145)
Total Bilirubin: 0.3 mg/dL (ref 0.3–1.2)
Total Protein: 6.4 g/dL — ABNORMAL LOW (ref 6.5–8.1)

## 2017-03-05 LAB — CBC WITH DIFFERENTIAL/PLATELET
Basophils Absolute: 0 10*3/uL (ref 0–0.1)
Basophils Relative: 1 %
Eosinophils Absolute: 0.1 10*3/uL (ref 0–0.7)
Eosinophils Relative: 2 %
HEMATOCRIT: 34.6 % — AB (ref 35.0–47.0)
HEMOGLOBIN: 11.5 g/dL — AB (ref 12.0–16.0)
LYMPHS ABS: 1 10*3/uL (ref 1.0–3.6)
Lymphocytes Relative: 26 %
MCH: 31.6 pg (ref 26.0–34.0)
MCHC: 33.1 g/dL (ref 32.0–36.0)
MCV: 95.4 fL (ref 80.0–100.0)
Monocytes Absolute: 0.5 10*3/uL (ref 0.2–0.9)
Monocytes Relative: 12 %
NEUTROS ABS: 2.2 10*3/uL (ref 1.4–6.5)
NEUTROS PCT: 59 %
Platelets: 132 10*3/uL — ABNORMAL LOW (ref 150–440)
RBC: 3.63 MIL/uL — AB (ref 3.80–5.20)
RDW: 16.2 % — ABNORMAL HIGH (ref 11.5–14.5)
WBC: 3.8 10*3/uL (ref 3.6–11.0)

## 2017-03-05 LAB — URINALYSIS, COMPLETE (UACMP) WITH MICROSCOPIC
BACTERIA UA: NONE SEEN
Bilirubin Urine: NEGATIVE
Glucose, UA: NEGATIVE mg/dL
HGB URINE DIPSTICK: NEGATIVE
Ketones, ur: NEGATIVE mg/dL
Nitrite: NEGATIVE
PROTEIN: NEGATIVE mg/dL
RBC / HPF: NONE SEEN RBC/hpf (ref 0–5)
SPECIFIC GRAVITY, URINE: 1.012 (ref 1.005–1.030)
pH: 5 (ref 5.0–8.0)

## 2017-03-05 MED ORDER — POTASSIUM CHLORIDE ER 10 MEQ PO TBCR: 60.0000 meq | EXTENDED_RELEASE_TABLET | Freq: Every day | ORAL | 2 refills | Status: DC

## 2017-03-05 MED ORDER — SODIUM CHLORIDE 0.9 % IV SOLN: 5.0000 mg/kg | Freq: Once | INTRAVENOUS | Status: DC

## 2017-03-05 MED ORDER — LEUCOVORIN CALCIUM INJECTION 350 MG: 400.0000 mg/m2 | Freq: Once | INTRAVENOUS | Status: DC

## 2017-03-05 MED ORDER — INFLUENZA VAC SPLIT QUAD 0.5 ML IM SUSY
0.5000 mL | PREFILLED_SYRINGE | Freq: Once | INTRAMUSCULAR | Status: AC
  Administered 2017-03-05: 0.5 mL via INTRAMUSCULAR
  Filled 2017-03-05: qty 0.5

## 2017-03-05 MED ORDER — SODIUM CHLORIDE 0.9 % IV SOLN
Freq: Once | INTRAVENOUS | Status: AC
  Administered 2017-03-05: 10:00:00 via INTRAVENOUS
  Filled 2017-03-05: qty 1000

## 2017-03-05 MED ORDER — ONDANSETRON HCL 40 MG/20ML IJ SOLN
Freq: Once | INTRAMUSCULAR | Status: AC
  Administered 2017-03-05: 8 mg via INTRAVENOUS
  Filled 2017-03-05: qty 4

## 2017-03-05 MED ORDER — LEUCOVORIN CALCIUM INJECTION 100 MG
20.0000 mg/m2 | Freq: Once | INTRAMUSCULAR | Status: AC
  Administered 2017-03-05: 28 mg via INTRAVENOUS
  Filled 2017-03-05: qty 1.4

## 2017-03-05 MED ORDER — BEVACIZUMAB CHEMO INJECTION 400 MG/16ML
5.0000 mg/kg | Freq: Once | INTRAVENOUS | Status: AC
  Administered 2017-03-05: 250 mg via INTRAVENOUS
  Filled 2017-03-05: qty 10

## 2017-03-05 MED ORDER — SODIUM CHLORIDE 0.9% FLUSH
10.0000 mL | INTRAVENOUS | Status: DC | PRN
  Administered 2017-03-05: 10 mL via INTRAVENOUS
  Filled 2017-03-05: qty 10

## 2017-03-05 MED ORDER — HEPARIN SOD (PORK) LOCK FLUSH 100 UNIT/ML IV SOLN
500.0000 [IU] | Freq: Once | INTRAVENOUS | Status: DC
  Filled 2017-03-05: qty 5

## 2017-03-05 MED ORDER — ONDANSETRON 8 MG PO TBDP: 8.0000 mg | ORAL_TABLET | Freq: Once | ORAL | Status: DC

## 2017-03-05 MED ORDER — SODIUM CHLORIDE 0.9 % IV SOLN
2400.0000 mg/m2 | INTRAVENOUS | Status: DC
  Administered 2017-03-05: 3350 mg via INTRAVENOUS
  Filled 2017-03-05: qty 67

## 2017-03-05 NOTE — Progress Notes (Signed)
[facility] CONSULT NOTE  Patient Care Team: Coral Spikes, DO as PCP - General (Family Medicine)  CHIEF COMPLAINTS/PURPOSE OF CONSULTATION:   Oncology History   # [facility] 2017- COLON CANCER- STAGE IV [s/p right hemi-colectomy; pT4apN2 (7/19LN)]; Pre-op CEA- [id number]  PET-  mesenteric adenopathy/mediastinal adenopathy/right-sided subclavicular lymph node.    April 17th -START FOLFOX; May 1st Add Avastin;   Aug 16th- Disc [name] [place]is [place] PN]; con 5FU-Avastin; NOV 27th CT C/A/P- CR; except for 83m LUL; 5FU-Avastin q 3 W  # March 2018- L2 uptake MET s/p RT [[facility]]; mid April X-geva   # MOLECULAR TESTING: K-ras-EXON 2- MUTATED; MSI- STABLE.      [place] of ascending colon [place] to [place]abdominal lymph node [place]   09/06/2015 Initial Diagnosis    Cancer of ascending colon metastatic to intra-abdominal lymph node (HCC)        HISTORY OF PRESENTING ILLNESS:  Nancy [id number].o.  female  with a diagnosed Metastatic right-sided colon cancer status post 5-FU plus Avastin; Is here for follow-up.   She continues to complain of numbness in the soles of her feet. It has not progressed or improved. 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 Continues neurontin and cymbalta 460mdaily but does not feel her neuropathy has improved. Describes as 'annoying' but does not impact her ADLs and has not had falls. Denies drowsiness, dizziness, or fatigue from neurontin. Expresses interest in acupuncture again.   Continues to have low back pain rated 6/10 during exacerbation with improvement to 2/10 with prn hydrocodone. Denies constipation. Has diarrhea at times controlled with imodium.    Previously she had hypokalemia. She continues oral supplementation 40 meq daily and attempts to consume potassium rich foods.   She has had a tooth extraction 01/23/83/53ithout complication or adverse bleeding. Denies other bleeding in setting of thrombocytopenia.  ROS: A complete 10 point review  of system is done which is negative except mentioned above in history of present illness  MEDICAL HISTORY:  Past Medical History:  Diagnosis Date  . Chicken pox   . [place]   Partial colectomy 07/2015 + chemo tx's  . Hypertension   . Hypokalemia   . Menopause    > 5 yrs  . [facility]    SURGICAL HISTORY: Past Surgical History:  Procedure Laterality Date  . BREAST BIOPSY Right    2007? pt unsure done by [facility]  . COLONOSCOPY    . COLONOSCOPY WITH PROPOFOL N/A 10/14/2015   Procedure: COLONOSCOPY WITH PROPOFOL;  Surgeon: PaHulen LusterMD;  [facility];  Service: Endoscopy;  Laterality: N/A;  . COLOSTOMY REVISION Right 08/09/2015   Procedure: COLON RESECTION RIGHT;  Surgeon: RiFlorene GlenMD;  Location: [facility] ORS;  Service: General;  Laterality: Right;  . ECTOPIC PREGNANCY SURGERY    . PORTACATH PLACEMENT N/A 09/01/2015   Procedure: INSERTION PORT-A-CATH;  Surgeon: RiFlorene GlenMD;  Location: [facility] ORS;  Service: General;  Laterality: N/A;  . SHOULDER SURGERY Left     [place] no alcohol. 2 childern [25 & 17 years] Social History   Social History  . Marital status: Married    Spouse name: N/A  . Number of children: N/A  . Years of education: N/A   Occupational History  . Not on file.   Social History Main Topics  . Smoking status: Former Smoker  Packs/day: 0.25    Years: 2.00  . Smokeless tobacco: Never Used     Comment: recently quit  . Alcohol use No  . Drug use: No  . Sexual activity: Yes   Other Topics Concern  . Not on file   Social History Narrative  . No narrative on file    FAMILY HISTORY: 1 brother& 1 sister- no cancer. Mom-no cancer Family History  Problem Relation Age of Onset  . Hypertension Mother   . Arthritis Father   . Prostate cancer Father   . Diabetes Maternal Grandmother   . Colon cancer Maternal Grandfather         colon    ALLERGIES:  has No Known Allergies.  MEDICATIONS:  Current Outpatient Prescriptions  Medication Sig Dispense Refill  . acetaminophen (TYLENOL) 325 MG tablet Take 2 tablets (650 mg total) by mouth every 6 (six) hours as needed for mild pain (or Fever >/= 101).    . benazepril-hydrochlorthiazide (LOTENSIN HCT) 20-12.5 MG tablet Take 1 tablet by mouth daily. 60 tablet 3  . diphenoxylate-atropine (LOMOTIL) 2.5-0.025 MG tablet Take 1 tablet by mouth 4 (four) times daily as needed for diarrhea or loose stools. Take it along with immodium 40 tablet 4  . DULoxetine (CYMBALTA) 20 MG capsule Take 2 capsules (40 mg total) by mouth daily. 60 capsule 2  . feeding supplement (BOOST HIGH PROTEIN) LIQD Take 1 Container by mouth daily.    . ferrous sulfate (FERROUSUL) 325 (65 FE) MG tablet Take 1 tablet (325 mg total) by mouth daily with breakfast. 90 tablet 3  . gabapentin (NEURONTIN) 300 MG capsule One pill in am; and 2 pills at night prior to sleep. 90 capsule 3  . HYDROcodone-acetaminophen (NORCO/VICODIN) 5-325 MG tablet Take 1 tablet by mouth every 8 (eight) hours as needed for moderate pain. 40 tablet 0  . lidocaine-prilocaine (EMLA) cream Apply 1 application topically as needed. Apply generously over the Mediport 45 minutes prior to chemotherapy. 30 g 6  . loperamide (IMODIUM) 2 MG capsule Take 2 mg by mouth as needed for diarrhea or loose stools.    . Multiple Vitamins-Minerals (MULTIVITAMIN ADULT PO) Take 1 tablet by mouth daily.    . ondansetron (ZOFRAN) 8 MG tablet Take 1 tablet (8 mg total) by mouth every 8 (eight) hours as needed for nausea or vomiting (start 3 days; after chemo). 40 tablet 0  . potassium chloride SA (K-DUR,KLOR-CON) 20 MEQ tablet Take 2 tablets (40 mEq total) by mouth daily. 60 tablet 2  . prochlorperazine (COMPAZINE) 10 MG tablet Take 1 tablet (10 mg total) by mouth every 6 (six) hours as needed for nausea or vomiting. 90 tablet 1   No current  facility-administered medications for this visit.    Facility-Administered Medications Ordered in Other Visits  Medication Dose Route Frequency Provider Last Rate Last Dose  . heparin lock flush 100 unit/mL  500 Units Intravenous Once Charlaine Dalton R, MD      . sodium chloride flush (NS) 0.9 % injection 10 mL  10 mL Intravenous PRN Cammie Sickle, MD   10 mL at 10/04/15 0901  . sodium chloride flush (NS) 0.9 % injection 10 mL  10 mL Intravenous PRN Cammie Sickle, MD   10 mL at 03/05/17 0854      PHYSICAL EXAMINATION: ECOG PERFORMANCE STATUS: 0 - Asymptomatic  Vitals:   03/05/17 0919  BP: 99/66  Pulse: 68  Resp: 16  Temp: 98.1 F (36.7 C)   Filed Weights  03/05/17 0919  Weight: 111 lb 12.8 oz (50.7 kg)    GENERAL: Well-nourished well-developed; thin built; Alert, no distress and comfortable.   Accompanied by her mother.  EYES: no pallor or icterus OROPHARYNX: no thrush or ulceration NECK: supple, no masses felt LYMPH:  no palpable lymphadenopathy in the cervical, axillary or inguinal regions LUNGS: clear to auscultation and  No wheeze or crackles HEART/CVS: regular rate & rhythm and no murmurs; No lower extremity edema ABDOMEN: abdomen soft, non-tender and normal bowel sounds; No infections. Musculoskeletal:no cyanosis of digits and no clubbing  PSYCH: alert & oriented x 3 with fluent speech NEURO: no focal motor/sensory deficits SKIN:  no rashes or significant lesions  LABORATORY DATA:  I have reviewed the data as listed Lab Results  Component Value Date   WBC 3.8 03/05/2017   HGB 11.5 (L) 03/05/2017   HCT 34.6 (L) 03/05/2017   MCV 95.4 03/05/2017   PLT 132 (L) 03/05/2017    Recent Labs  01/24/17 0919 02/12/17 0922 03/05/17 0857  NA 135 137 137  K 3.2* 2.9* 3.2*  CL 105 105 104  CO2 _0 GLUCOSE 92 76 96  BUN _1 CREATININE 0.74 0.81 0.73  CALCIUM 8.5* 8.4* 8.5*  GFRNONAA >60 >60 >60  GFRAA >60 >60 >60  PROT 6.8 6.4* 6.4*   ALBUMIN 3.6 3.5 3.5  AST _2 ALT 13* 15 12*  ALKPHOS 61 59 58  BILITOT 0.3 0.4 0.3    IMPRESSION: Stable sclerotic bone lesion in L2 vertebral body. Although atypical for colon carcinoma, bone metastasis cannot be excluded.  No other sites of metastatic disease identified.  Aortic atherosclerosis.  Electronically Signed   By: Earle Gell M.D.   On: 12/01/2016 10:51   ASSESSMENT & PLAN:   .Cancer of ascending colon metastatic to intra-abdominal lymph node ([place]) # Colon cancer- cecal/ right-sided; STAGE IV [intraabdominal/retroperitonel LN/Supracla; K-ras mutated]. Currently on 5FU palliative chemotherapy+ avastin every 3 w. No obvious evidence of clinical progression. July 16th 2018- CT-NED; stable- L-2 bone lesion.   # Labs today reviewed;  acceptable for treatment today- except for- [see below] Mild thrombocytopenia. Again discussed that the treatments are palliative/indefinite. Will order scans again in oct 2018.   # Thrombocytopenia platelets 120 today/- G-1. Monitor for now.   # tooth pain. She has planned extraction for Friday. Will HOLD avastin today so that she may proceed with extraction.   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  # symptomatic L2 bone lesion status post radiation finished May 2018.CT July 2018- STABLE. Hydrocodone for pain. Refill provided today.  # Mild hypokalemia- 3.2; recommend continued dietary supplementation.  # follow up in 3 weeks/labs/UA; chemo. HOLD avastin today.   Lauren G. AZenia Resides DNP AGNP-C 03/05/17 9:30 AM   LVerlon Au NP 03/05/2017 9:48 AM

## 2017-03-05 NOTE — Assessment & Plan Note (Signed)
#  Colon cancer- cecal/ right-sided; STAGE IV [intraabdominal/retroperitonel LN/Supracla; K-ras mutated]. Currently on 5FU palliative chemotherapy+ avastin every 3 w. No obvious evidence of clinical progression. July 16th 2018- CT-NED; stable- L-2 bone lesion.   # Labs today reviewed;  acceptable for treatment today- except for- [see below] Mild thrombocytopenia. Again discussed that the treatments are palliative/indefinite. Will order scans again in oct 2018.   # Thrombocytopenia platelets 120 today/- G-1. Monitor for now.   # tooth pain. She has planned extraction for Friday. Will HOLD avastin today so that she may proceed with extraction.   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  # symptomatic L2 bone lesion status post radiation finished May 2018.CT July 2018- STABLE. Hydrocodone for pain. Refill provided today.  # Mild hypokalemia- 3.2; recommend continued dietary supplementation.  # follow up in 3 weeks/labs/UA; chemo. HOLD avastin today.  

## 2017-03-07 ENCOUNTER — Telehealth: Payer: Self-pay | Admitting: *Deleted

## 2017-03-07 ENCOUNTER — Inpatient Hospital Stay: Payer: BLUE CROSS/BLUE SHIELD

## 2017-03-07 VITALS — BP 128/72 | HR 60 | Temp 97.1°F | Resp 18

## 2017-03-07 DIAGNOSIS — C182 Malignant neoplasm of ascending colon: Secondary | ICD-10-CM

## 2017-03-07 DIAGNOSIS — C772 Secondary and unspecified malignant neoplasm of intra-abdominal lymph nodes: Principal | ICD-10-CM

## 2017-03-07 MED ORDER — HEPARIN SOD (PORK) LOCK FLUSH 100 UNIT/ML IV SOLN
500.0000 [IU] | Freq: Once | INTRAVENOUS | Status: AC | PRN
  Administered 2017-03-07: 500 [IU]
  Filled 2017-03-07: qty 5

## 2017-03-07 MED ORDER — SODIUM CHLORIDE 0.9% FLUSH
10.0000 mL | INTRAVENOUS | Status: DC | PRN
  Administered 2017-03-07: 10 mL
  Filled 2017-03-07: qty 10

## 2017-03-07 MED ORDER — POTASSIUM CHLORIDE ER 10 MEQ PO TBCR: 60.0000 meq | EXTENDED_RELEASE_TABLET | Freq: Every day | ORAL | 0 refills | Status: DC

## 2017-03-07 NOTE — Telephone Encounter (Signed)
Pharmacy notified and new prescription submitted

## 2017-03-07 NOTE — Telephone Encounter (Signed)
Pharmacy asking if the prescription for Potassium 60 mg daily can be sent out as a 90 supply. Please advise

## 2017-03-07 NOTE — Telephone Encounter (Signed)
Dr. Rogue Bussing says a 90 day supply is okay. Thanks!

## 2017-03-26 ENCOUNTER — Inpatient Hospital Stay (HOSPITAL_BASED_OUTPATIENT_CLINIC_OR_DEPARTMENT_OTHER): Payer: BLUE CROSS/BLUE SHIELD | Admitting: Internal Medicine

## 2017-03-26 ENCOUNTER — Inpatient Hospital Stay: Payer: BLUE CROSS/BLUE SHIELD

## 2017-03-26 ENCOUNTER — Inpatient Hospital Stay: Payer: BLUE CROSS/BLUE SHIELD | Attending: Internal Medicine

## 2017-03-26 ENCOUNTER — Encounter: Payer: Self-pay | Admitting: Internal Medicine

## 2017-03-26 VITALS — BP 108/74 | HR 80 | Resp 16 | Wt 112.4 lb

## 2017-03-26 DIAGNOSIS — C7951 Secondary malignant neoplasm of bone: Secondary | ICD-10-CM | POA: Insufficient documentation

## 2017-03-26 DIAGNOSIS — Z8042 Family history of malignant neoplasm of prostate: Secondary | ICD-10-CM | POA: Diagnosis not present

## 2017-03-26 DIAGNOSIS — E876 Hypokalemia: Secondary | ICD-10-CM

## 2017-03-26 DIAGNOSIS — Z923 Personal history of irradiation: Secondary | ICD-10-CM

## 2017-03-26 DIAGNOSIS — R599 Enlarged lymph nodes, unspecified: Secondary | ICD-10-CM

## 2017-03-26 DIAGNOSIS — I10 Essential (primary) hypertension: Secondary | ICD-10-CM

## 2017-03-26 DIAGNOSIS — C772 Secondary and unspecified malignant neoplasm of intra-abdominal lymph nodes: Secondary | ICD-10-CM | POA: Diagnosis not present

## 2017-03-26 DIAGNOSIS — C182 Malignant neoplasm of ascending colon: Secondary | ICD-10-CM | POA: Diagnosis not present

## 2017-03-26 DIAGNOSIS — Z800 Family history of malignant neoplasm of digestive organs: Secondary | ICD-10-CM | POA: Diagnosis not present

## 2017-03-26 DIAGNOSIS — I700 Atherosclerosis of aorta: Secondary | ICD-10-CM | POA: Diagnosis not present

## 2017-03-26 DIAGNOSIS — D696 Thrombocytopenia, unspecified: Secondary | ICD-10-CM | POA: Diagnosis not present

## 2017-03-26 DIAGNOSIS — M545 Low back pain: Secondary | ICD-10-CM | POA: Diagnosis not present

## 2017-03-26 DIAGNOSIS — G620 Drug-induced polyneuropathy: Secondary | ICD-10-CM

## 2017-03-26 DIAGNOSIS — Z5112 Encounter for antineoplastic immunotherapy: Secondary | ICD-10-CM | POA: Diagnosis not present

## 2017-03-26 DIAGNOSIS — Z79899 Other long term (current) drug therapy: Secondary | ICD-10-CM | POA: Diagnosis not present

## 2017-03-26 DIAGNOSIS — T451X5S Adverse effect of antineoplastic and immunosuppressive drugs, sequela: Secondary | ICD-10-CM | POA: Insufficient documentation

## 2017-03-26 DIAGNOSIS — M899 Disorder of bone, unspecified: Secondary | ICD-10-CM

## 2017-03-26 DIAGNOSIS — Z9049 Acquired absence of other specified parts of digestive tract: Secondary | ICD-10-CM | POA: Diagnosis not present

## 2017-03-26 DIAGNOSIS — Z87891 Personal history of nicotine dependence: Secondary | ICD-10-CM

## 2017-03-26 LAB — URINALYSIS, COMPLETE (UACMP) WITH MICROSCOPIC
Bacteria, UA: NONE SEEN
Bilirubin Urine: NEGATIVE
Glucose, UA: NEGATIVE mg/dL
Hgb urine dipstick: NEGATIVE
Ketones, ur: NEGATIVE mg/dL
Leukocytes, UA: NEGATIVE
Nitrite: NEGATIVE
PH: 5 (ref 5.0–8.0)
Protein, ur: NEGATIVE mg/dL
RBC / HPF: NONE SEEN RBC/hpf (ref 0–5)
SPECIFIC GRAVITY, URINE: 1.008 (ref 1.005–1.030)

## 2017-03-26 LAB — COMPREHENSIVE METABOLIC PANEL
ALBUMIN: 3.8 g/dL (ref 3.5–5.0)
ALK PHOS: 60 U/L (ref 38–126)
ALT: 12 U/L — ABNORMAL LOW (ref 14–54)
AST: 16 U/L (ref 15–41)
Anion gap: 6 (ref 5–15)
BILIRUBIN TOTAL: 0.4 mg/dL (ref 0.3–1.2)
BUN: 19 mg/dL (ref 6–20)
CALCIUM: 8.6 mg/dL — AB (ref 8.9–10.3)
CO2: 20 mmol/L — AB (ref 22–32)
Chloride: 109 mmol/L (ref 101–111)
Creatinine, Ser: 0.92 mg/dL (ref 0.44–1.00)
GFR calc Af Amer: >60 mL/min (ref >60)
GFR calc non Af Amer: >60 mL/min (ref >60)
Glucose, Bld: 91 mg/dL (ref 65–99)
POTASSIUM: 4.6 mmol/L (ref 3.5–5.1)
SODIUM: 135 mmol/L (ref 135–145)
TOTAL PROTEIN: 6.6 g/dL (ref 6.5–8.1)

## 2017-03-26 LAB — CBC WITH DIFFERENTIAL/PLATELET
BASOS ABS: 0 10*3/uL (ref 0–0.1)
BASOS PCT: 0 %
EOS PCT: 2 %
Eosinophils Absolute: 0.1 10*3/uL (ref 0–0.7)
HEMATOCRIT: 37.6 % (ref 35.0–47.0)
Hemoglobin: 12.5 g/dL (ref 12.0–16.0)
Lymphocytes Relative: 26 %
Lymphs Abs: 1 10*3/uL (ref 1.0–3.6)
MCH: 31.3 pg (ref 26.0–34.0)
MCHC: 33.1 g/dL (ref 32.0–36.0)
MCV: 94.6 fL (ref 80.0–100.0)
MONO ABS: 0.6 10*3/uL (ref 0.2–0.9)
MONOS PCT: 14 %
NEUTROS ABS: 2.3 10*3/uL (ref 1.4–6.5)
Neutrophils Relative %: 58 %
PLATELETS: 111 10*3/uL — AB (ref 150–440)
RBC: 3.98 MIL/uL (ref 3.80–5.20)
RDW: 17 % — AB (ref 11.5–14.5)
WBC: 4 10*3/uL (ref 3.6–11.0)

## 2017-03-26 MED ORDER — ONDANSETRON HCL 40 MG/20ML IJ SOLN
Freq: Once | INTRAMUSCULAR | Status: AC
  Administered 2017-03-26: 8 mg via INTRAVENOUS
  Filled 2017-03-26: qty 4

## 2017-03-26 MED ORDER — LEUCOVORIN CALCIUM INJECTION 100 MG
20.0000 mg/m2 | Freq: Once | INTRAMUSCULAR | Status: AC
  Administered 2017-03-26: 28 mg via INTRAVENOUS
  Filled 2017-03-26: qty 1.4

## 2017-03-26 MED ORDER — DEXTROSE 5 % IV SOLN: 400.0000 mg/m2 | Freq: Once | INTRAVENOUS | Status: DC

## 2017-03-26 MED ORDER — SODIUM CHLORIDE 0.9 % IV SOLN
2400.0000 mg/m2 | INTRAVENOUS | Status: DC
  Administered 2017-03-26: 3350 mg via INTRAVENOUS
  Filled 2017-03-26: qty 67

## 2017-03-26 MED ORDER — SODIUM CHLORIDE 0.9% FLUSH
10.0000 mL | INTRAVENOUS | Status: DC | PRN
  Filled 2017-03-26: qty 10

## 2017-03-26 MED ORDER — SODIUM CHLORIDE 0.9 % IV SOLN
Freq: Once | INTRAVENOUS | Status: AC
  Administered 2017-03-26: 10:00:00 via INTRAVENOUS
  Filled 2017-03-26: qty 1000

## 2017-03-26 MED ORDER — SODIUM CHLORIDE 0.9% FLUSH
10.0000 mL | INTRAVENOUS | Status: DC | PRN
  Administered 2017-03-26: 10 mL via INTRAVENOUS
  Filled 2017-03-26: qty 10

## 2017-03-26 MED ORDER — HEPARIN SOD (PORK) LOCK FLUSH 100 UNIT/ML IV SOLN: 500.0000 [IU] | Freq: Once | INTRAVENOUS | Status: DC

## 2017-03-26 MED ORDER — HEPARIN SOD (PORK) LOCK FLUSH 100 UNIT/ML IV SOLN: 500.0000 [IU] | Freq: Once | INTRAVENOUS | Status: DC | PRN

## 2017-03-26 MED ORDER — SODIUM CHLORIDE 0.9 % IV SOLN
250.0000 mg | Freq: Once | INTRAVENOUS | Status: AC
  Administered 2017-03-26: 250 mg via INTRAVENOUS
  Filled 2017-03-26: qty 10

## 2017-03-26 NOTE — Progress Notes (Signed)
[place] CONSULT NOTE  Patient Care Team: Coral Spikes, DO as PCP - General (Family Medicine)  CHIEF COMPLAINTS/PURPOSE OF CONSULTATION:   Oncology History   # [facility] 2017- COLON CANCER- STAGE IV [s/p right hemi-colectomy; pT4apN2 (7/19LN)]; Pre-op CEA- [id number]  PET-  mesenteric adenopathy/mediastinal adenopathy/right-sided subclavicular lymph node.    April 17th -START FOLFOX; May 1st Add Avastin;   Aug 16th- Disc [name] [place] PN]; con 5FU-Avastin; NOV 27th CT C/A/P- CR; except for 72m LUL; 5FU-Avastin q 3 W  # March 2018- L2 uptake MET s/p RT [[facility]2018]; mid April X-geva   # MOLECULAR TESTING: K-ras-EXON 2- MUTATED; MSI- STABLE.      Cancer of ascending colon metastatic to intra-abdominal lymph node ([facility]   09/06/2015 Initial Diagnosis    Cancer of ascending colon metastatic to intra-abdominal lymph node (HCC)        HISTORY OF PRESENTING ILLNESS:  MCORNELIOUS DIVEN89y.o.  female  with a diagnosed Metastatic right-sided colon cancer status post 5-FU plus Avastin; Is here for follow-up.   Shattered tooth extraction approximately 6 weeks ago.   Denies any worsening pain. She has mild to moderate tingling and numbness in the extremities. She is on Neurontin/Cymbalta. This is not getting any worse. She continues to take potassium pills. Denies any leg cramps. No belly pain or constipation.  ROS: A complete 10 point review of system is done which is negative except mentioned above in history of present illness  MEDICAL HISTORY:  Past Medical History:  Diagnosis Date  . Chicken pox   . [place]    Partial colectomy 07/2015 + chemo tx's  . Hypertension   . Hypokalemia   . Menopause    > 5 yrs  . Peripheral neuropathy due to chemotherapy [facility]     SURGICAL HISTORY: Past Surgical History:  Procedure Laterality Date  . BREAST BIOPSY Right    2007? pt unsure done by [facility]  . COLONOSCOPY    . ECTOPIC PREGNANCY SURGERY    . SHOULDER SURGERY  Left     SOCIAL HISTORY[place]  [place] in [place] days; no alcohol. 2 childern [25 & 17 years] Social History   Socioeconomic History  . Marital status: Married    Spouse name: Not on file  . Number of children: Not on file  . Years of education: Not on file  . Highest education level: Not on file  Social Needs  . Financial resource strain: Not on file  . Food insecurity - worry: Not on file  . Food insecurity - inability: Not on file  . Transportation needs - medical: Not on file  . Transportation needs - non-medical: Not on file  Occupational History  . Not on file  Tobacco Use  . Smoking status: Former Smoker    Packs/day: 0.25    Years: 2.00    Pack years: 0.50  . Smokeless tobacco: Never Used  . Tobacco comment: recently quit  Substance and Sexual Activity  . Alcohol use: No    Alcohol/week: 0.0 oz  . Drug use: No  . Sexual activity: Yes  Other Topics Concern  . Not on file  Social History Narrative  . Not on file    FAMILY HISTORY: 1 brother& 1 sister- no cancer. Mom-no cancer Family History  Problem Relation Age of Onset  . Hypertension Mother   . Arthritis Father   . Prostate cancer Father   . Diabetes Maternal Grandmother   .  Colon cancer Maternal Grandfather        colon    ALLERGIES:  has No Known Allergies.  MEDICATIONS:  Current Outpatient Medications  Medication Sig Dispense Refill  . acetaminophen (TYLENOL) 325 MG tablet Take 2 tablets (650 mg total) by mouth every 6 (six) hours as needed for mild pain (or Fever >/= 101).    . benazepril-hydrochlorthiazide (LOTENSIN HCT) 20-12.5 MG tablet Take 1 tablet by mouth daily. 60 tablet 3  . diphenoxylate-atropine (LOMOTIL) 2.5-0.025 MG tablet Take 1 tablet by mouth 4 (four) times daily as needed for diarrhea or loose stools. Take it along with immodium 40 tablet 4  . DULoxetine (CYMBALTA) 20 MG capsule Take 2 capsules (40 mg total) by mouth daily. 60 capsule 2  . feeding  supplement (BOOST HIGH PROTEIN) LIQD Take 1 Container by mouth daily.    . ferrous sulfate (FERROUSUL) 325 (65 FE) MG tablet Take 1 tablet (325 mg total) by mouth daily with breakfast. 90 tablet 3  . gabapentin (NEURONTIN) 300 MG capsule One pill in am; and 2 pills at night prior to sleep. 90 capsule 3  . HYDROcodone-acetaminophen (NORCO/VICODIN) 5-325 MG tablet Take 1 tablet by mouth every 8 (eight) hours as needed for moderate pain. 40 tablet 0  . lidocaine-prilocaine (EMLA) cream Apply 1 application topically as needed. Apply generously over the Mediport 45 minutes prior to chemotherapy. 30 g 6  . loperamide (IMODIUM) 2 MG capsule Take 2 mg by mouth as needed for diarrhea or loose stools.    . Multiple Vitamins-Minerals (MULTIVITAMIN ADULT PO) Take 1 tablet by mouth daily.    . ondansetron (ZOFRAN) 8 MG tablet Take 1 tablet (8 mg total) by mouth every 8 (eight) hours as needed for nausea or vomiting (start 3 days; after chemo). 40 tablet 0  . potassium chloride (K-DUR) 10 MEQ tablet Take 6 tablets (60 mEq total) by mouth daily. 540 tablet 0  . prochlorperazine (COMPAZINE) 10 MG tablet Take 1 tablet (10 mg total) by mouth every 6 (six) hours as needed for nausea or vomiting. (Patient not taking: Reported on 03/26/2017) 90 tablet 1   No current facility-administered medications for this visit.    Facility-Administered Medications Ordered in Other Visits  Medication Dose Route Frequency Provider Last Rate Last Dose  . fluorouracil (ADRUCIL) 3,350 mg in sodium chloride 0.9 % 83 mL chemo infusion  2,400 mg/m2 (Treatment Plan Recorded) Intravenous 1 day or 1 dose Cammie Sickle, MD   3,350 mg at 03/26/17 1103  . heparin lock flush 100 unit/mL  500 Units Intravenous Once Charlaine Dalton R, MD      . heparin lock flush 100 unit/mL  500 Units Intracatheter Once PRN Charlaine Dalton R, MD      . sodium chloride flush (NS) 0.9 % injection 10 mL  10 mL Intravenous PRN Cammie Sickle, MD    10 mL at 10/04/15 0901  . sodium chloride flush (NS) 0.9 % injection 10 mL  10 mL Intravenous PRN Cammie Sickle, MD   10 mL at 03/26/17 0827  . sodium chloride flush (NS) 0.9 % injection 10 mL  10 mL Intracatheter PRN Cammie Sickle, MD          PHYSICAL EXAMINATION: ECOG PERFORMANCE STATUS: 0 - Asymptomatic  Vitals:   03/26/17 0902  BP: 108/74  Pulse: 80  Resp: 16   Filed Weights   03/26/17 0902  Weight: 112 lb 6.4 oz (51 kg)    GENERAL: Well-nourished well-developed; thin  built; Alert, no distress and comfortable.   Accompanied by her mother.  EYES: no pallor or icterus OROPHARYNX: no thrush or ulceration NECK: supple, no masses felt LYMPH:  no palpable lymphadenopathy in the cervical, axillary or inguinal regions LUNGS: clear to auscultation and  No wheeze or crackles HEART/CVS: regular rate & rhythm and no murmurs; No lower extremity edema ABDOMEN: abdomen soft, non-tender and normal bowel sounds; No infections. Musculoskeletal:no cyanosis of digits and no clubbing  PSYCH: alert & oriented x 3 with fluent speech NEURO: no focal motor/sensory deficits SKIN:  no rashes or significant lesions  LABORATORY DATA:  I have reviewed the data as listed Lab Results  Component Value Date   WBC 4.0 03/26/2017   HGB 12.5 03/26/2017   HCT 37.6 03/26/2017   MCV 94.6 03/26/2017   PLT 111 (L) 03/26/2017   Recent Labs    02/12/17 0922 03/05/17 0857 03/26/17 0835  NA 137 137 135  K 2.9* 3.2* 4.6  CL 105 104 109  CO2 25 24 20*  GLUCOSE 76 96 91  BUN _0 CREATININE 0.81 0.73 0.92  CALCIUM 8.4* 8.5* 8.6*  GFRNONAA >60 >60 >60  GFRAA >60 >60 >60  PROT 6.4* 6.4* 6.6  ALBUMIN 3.5 3.5 3.8  AST _1 ALT 15 12* 12*  ALKPHOS 59 58 60  BILITOT 0.4 0.3 0.4    IMPRESSION: Stable sclerotic bone lesion in L2 vertebral body. Although atypical for colon carcinoma, bone metastasis cannot be excluded.  No other sites of metastatic disease  identified.  Aortic atherosclerosis.  Electronically Signed   By: Earle Gell M.D.   On: 12/01/2016 10:51   ASSESSMENT & PLAN:   .Cancer of ascending colon metastatic to intra-abdominal lymph node ([place]) # Colon cancer- cecal/ right-sided; STAGE IV [intraabdominal/retroperitonel LN/Supracla; K-ras mutated]. Currently on 5FU palliative chemotherapy+ avastin every 3 w. No obvious evidence of clinical progression. July 16th 2018- CT-NED; stable- L-2 bone lesion.   # Labs today reviewed;  acceptable for treatment today- except for- [see below] Mild thrombocytopenia. Again discussed that the treatments are palliative/indefinite. Order scans today.  # Thrombocytopenia platelets 110 today/- G-1. Monitor for now.   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  # symptomatic L2 bone lesion status post radiation finished May 2018.CT July 2018- STABLE. Hydrocodone for pain. Refill provided today.  # Mild hypokalemia- 4.6;recommend cutting down K-dur to 30 a day recommend continued dietary supplementation.  # follow up in 3 weeks/labs/UA; chemo;CT prior.   Lauren G. AZenia Resides DNP AGNP-C 03/05/17 9:30 AM   GCammie Sickle MD 03/26/2017 12:35 PM

## 2017-03-26 NOTE — Assessment & Plan Note (Addendum)
#  Colon cancer- cecal/ right-sided; STAGE IV [intraabdominal/retroperitonel LN/Supracla; K-ras mutated]. Currently on 5FU palliative chemotherapy+ avastin every 3 w. No obvious evidence of clinical progression. July 16th 2018- CT-NED; stable- L-2 bone lesion.   # Labs today reviewed;  acceptable for treatment today- except for- [see below] Mild thrombocytopenia. Again discussed that the treatments are palliative/indefinite. Order scans today.  # Thrombocytopenia platelets 110 today/- G-1. Monitor for now.   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  # symptomatic L2 bone lesion status post radiation finished May 2018.CT July 2018- STABLE. Hydrocodone for pain. Refill provided today.  # Mild hypokalemia- 4.6;recommend cutting down K-dur to 30 a day recommend continued dietary supplementation.  # follow up in 3 weeks/labs/UA; chemo;CT prior.

## 2017-03-28 ENCOUNTER — Inpatient Hospital Stay: Payer: BLUE CROSS/BLUE SHIELD

## 2017-03-28 VITALS — BP 128/75 | HR 83 | Temp 95.6°F | Resp 16

## 2017-03-28 DIAGNOSIS — C182 Malignant neoplasm of ascending colon: Secondary | ICD-10-CM

## 2017-03-28 DIAGNOSIS — C772 Secondary and unspecified malignant neoplasm of intra-abdominal lymph nodes: Principal | ICD-10-CM

## 2017-03-28 MED ORDER — SODIUM CHLORIDE 0.9% FLUSH
10.0000 mL | INTRAVENOUS | Status: DC | PRN
  Administered 2017-03-28: 10 mL
  Filled 2017-03-28: qty 10

## 2017-03-28 MED ORDER — HEPARIN SOD (PORK) LOCK FLUSH 100 UNIT/ML IV SOLN
500.0000 [IU] | Freq: Once | INTRAVENOUS | Status: AC | PRN
  Administered 2017-03-28: 500 [IU]
  Filled 2017-03-28: qty 5

## 2017-03-30 ENCOUNTER — Telehealth: Payer: Self-pay | Admitting: *Deleted

## 2017-03-30 MED ORDER — DULOXETINE HCL 20 MG PO CPEP: 40.0000 mg | ORAL_CAPSULE | Freq: Every day | ORAL | 2 refills | Status: DC

## 2017-03-30 NOTE — Telephone Encounter (Signed)
Patient called to report that she will be need ing to refill her Duloxetine soon and it is on national back order. Asking what she is to do

## 2017-03-30 NOTE — Telephone Encounter (Signed)
Patient gets her medicine from Mail order and I called local pharmacy [place] who says that they have drug in stock. Prescription e scribed to them and left message on patient voice mail that I have done this

## 2017-03-30 NOTE — Telephone Encounter (Signed)
I spoke with patient who said she does not have to pay for prescription if it comes mail order, then I explained to her even if she has to pay for it , she would not be without her medicine and that she can just get a 2 week supply locally until Mail order has some in stock. She agreed that she does not want to be without it so she will get at local pharmacy

## 2017-04-10 ENCOUNTER — Ambulatory Visit
Admission: RE | Admit: 2017-04-10 | Discharge: 2017-04-10 | Disposition: A | Discharge status: Home / Self Care | Payer: BLUE CROSS/BLUE SHIELD | Source: Ambulatory Visit | Attending: Internal Medicine | Admitting: Internal Medicine

## 2017-04-10 DIAGNOSIS — C772 Secondary and unspecified malignant neoplasm of intra-abdominal lymph nodes: Secondary | ICD-10-CM | POA: Diagnosis present

## 2017-04-10 DIAGNOSIS — I700 Atherosclerosis of aorta: Secondary | ICD-10-CM | POA: Insufficient documentation

## 2017-04-10 DIAGNOSIS — I2510 Atherosclerotic heart disease of native coronary artery without angina pectoris: Secondary | ICD-10-CM | POA: Diagnosis not present

## 2017-04-10 DIAGNOSIS — C182 Malignant neoplasm of ascending colon: Secondary | ICD-10-CM | POA: Diagnosis not present

## 2017-04-10 MED ORDER — IOPAMIDOL (ISOVUE-300) INJECTION 61%
100.0000 mL | Freq: Once | INTRAVENOUS | Status: AC | PRN
  Administered 2017-04-10: 85 mL via INTRAVENOUS

## 2017-04-16 ENCOUNTER — Inpatient Hospital Stay: Payer: BLUE CROSS/BLUE SHIELD

## 2017-04-16 ENCOUNTER — Other Ambulatory Visit: Payer: Self-pay

## 2017-04-16 ENCOUNTER — Inpatient Hospital Stay (HOSPITAL_BASED_OUTPATIENT_CLINIC_OR_DEPARTMENT_OTHER): Payer: BLUE CROSS/BLUE SHIELD | Admitting: Internal Medicine

## 2017-04-16 VITALS — BP 122/77 | HR 80 | Temp 93.6°F | Resp 18 | Wt 111.0 lb

## 2017-04-16 DIAGNOSIS — C7951 Secondary malignant neoplasm of bone: Secondary | ICD-10-CM

## 2017-04-16 DIAGNOSIS — D696 Thrombocytopenia, unspecified: Secondary | ICD-10-CM

## 2017-04-16 DIAGNOSIS — Z923 Personal history of irradiation: Secondary | ICD-10-CM | POA: Diagnosis not present

## 2017-04-16 DIAGNOSIS — C182 Malignant neoplasm of ascending colon: Secondary | ICD-10-CM | POA: Diagnosis not present

## 2017-04-16 DIAGNOSIS — Z8042 Family history of malignant neoplasm of prostate: Secondary | ICD-10-CM

## 2017-04-16 DIAGNOSIS — I10 Essential (primary) hypertension: Secondary | ICD-10-CM

## 2017-04-16 DIAGNOSIS — Z79899 Other long term (current) drug therapy: Secondary | ICD-10-CM

## 2017-04-16 DIAGNOSIS — G620 Drug-induced polyneuropathy: Secondary | ICD-10-CM

## 2017-04-16 DIAGNOSIS — I700 Atherosclerosis of aorta: Secondary | ICD-10-CM | POA: Diagnosis not present

## 2017-04-16 DIAGNOSIS — Z800 Family history of malignant neoplasm of digestive organs: Secondary | ICD-10-CM

## 2017-04-16 DIAGNOSIS — M545 Low back pain: Secondary | ICD-10-CM | POA: Diagnosis not present

## 2017-04-16 DIAGNOSIS — Z9049 Acquired absence of other specified parts of digestive tract: Secondary | ICD-10-CM

## 2017-04-16 DIAGNOSIS — C772 Secondary and unspecified malignant neoplasm of intra-abdominal lymph nodes: Secondary | ICD-10-CM

## 2017-04-16 DIAGNOSIS — E876 Hypokalemia: Secondary | ICD-10-CM

## 2017-04-16 DIAGNOSIS — T451X5S Adverse effect of antineoplastic and immunosuppressive drugs, sequela: Secondary | ICD-10-CM

## 2017-04-16 DIAGNOSIS — Z87891 Personal history of nicotine dependence: Secondary | ICD-10-CM

## 2017-04-16 LAB — COMPREHENSIVE METABOLIC PANEL
ALBUMIN: 3.5 g/dL (ref 3.5–5.0)
ALK PHOS: 58 U/L (ref 38–126)
ALT: 11 U/L — ABNORMAL LOW (ref 14–54)
AST: 16 U/L (ref 15–41)
Anion gap: 8 (ref 5–15)
BILIRUBIN TOTAL: 0.4 mg/dL (ref 0.3–1.2)
BUN: 19 mg/dL (ref 6–20)
CALCIUM: 8.8 mg/dL — AB (ref 8.9–10.3)
CO2: 24 mmol/L (ref 22–32)
Chloride: 106 mmol/L (ref 101–111)
Creatinine, Ser: 0.89 mg/dL (ref 0.44–1.00)
GFR calc Af Amer: >60 mL/min (ref >60)
GLUCOSE: 116 mg/dL — AB (ref 65–99)
Potassium: 3.2 mmol/L — ABNORMAL LOW (ref 3.5–5.1)
Sodium: 138 mmol/L (ref 135–145)
TOTAL PROTEIN: 6.3 g/dL — AB (ref 6.5–8.1)

## 2017-04-16 LAB — CBC WITH DIFFERENTIAL/PLATELET
BASOS ABS: 0 10*3/uL (ref 0–0.1)
BASOS PCT: 0 %
EOS ABS: 0.1 10*3/uL (ref 0–0.7)
EOS PCT: 1 %
HCT: 36 % (ref 35.0–47.0)
Hemoglobin: 12.1 g/dL (ref 12.0–16.0)
Lymphocytes Relative: 22 %
Lymphs Abs: 1 10*3/uL (ref 1.0–3.6)
MCH: 31.7 pg (ref 26.0–34.0)
MCHC: 33.6 g/dL (ref 32.0–36.0)
MCV: 94.4 fL (ref 80.0–100.0)
MONO ABS: 0.5 10*3/uL (ref 0.2–0.9)
Monocytes Relative: 12 %
Neutro Abs: 2.9 10*3/uL (ref 1.4–6.5)
Neutrophils Relative %: 65 %
PLATELETS: 141 10*3/uL — AB (ref 150–440)
RBC: 3.81 MIL/uL (ref 3.80–5.20)
RDW: 16.9 % — AB (ref 11.5–14.5)
WBC: 4.5 10*3/uL (ref 3.6–11.0)

## 2017-04-16 LAB — URINALYSIS, COMPLETE (UACMP) WITH MICROSCOPIC
BILIRUBIN URINE: NEGATIVE
Bacteria, UA: NONE SEEN
GLUCOSE, UA: NEGATIVE mg/dL
Hgb urine dipstick: NEGATIVE
KETONES UR: NEGATIVE mg/dL
Leukocytes, UA: NEGATIVE
Nitrite: NEGATIVE
PH: 5 (ref 5.0–8.0)
Protein, ur: NEGATIVE mg/dL
SPECIFIC GRAVITY, URINE: 1.02 (ref 1.005–1.030)

## 2017-04-16 MED ORDER — SODIUM CHLORIDE 0.9% FLUSH
10.0000 mL | INTRAVENOUS | Status: DC | PRN
Start: 2017-04-16 — End: 2017-04-16
  Administered 2017-04-16: 10 mL via INTRAVENOUS
  Filled 2017-04-16: qty 10

## 2017-04-16 MED ORDER — SODIUM CHLORIDE 0.9 % IV SOLN
2400.0000 mg/m2 | INTRAVENOUS | Status: DC
  Administered 2017-04-16: 3350 mg via INTRAVENOUS
  Filled 2017-04-16: qty 67

## 2017-04-16 MED ORDER — DEXTROSE 5 % IV SOLN
Freq: Once | INTRAVENOUS | Status: DC
  Filled 2017-04-16: qty 1000

## 2017-04-16 MED ORDER — BENAZEPRIL HCL 20 MG PO TABS: 20.0000 mg | ORAL_TABLET | Freq: Every day | ORAL | 6 refills | Status: DC

## 2017-04-16 MED ORDER — SODIUM CHLORIDE 0.9 % IV SOLN
Freq: Once | INTRAVENOUS | Status: AC
  Administered 2017-04-16: 8 mg via INTRAVENOUS
  Filled 2017-04-16: qty 4

## 2017-04-16 MED ORDER — SODIUM CHLORIDE 0.9 % IV SOLN
250.0000 mg | Freq: Once | INTRAVENOUS | Status: AC
  Administered 2017-04-16: 250 mg via INTRAVENOUS
  Filled 2017-04-16: qty 10

## 2017-04-16 MED ORDER — LEUCOVORIN CALCIUM INJECTION 350 MG: 400.0000 mg/m2 | Freq: Once | INTRAVENOUS | Status: DC

## 2017-04-16 MED ORDER — SODIUM CHLORIDE 0.9 % IV SOLN
Freq: Once | INTRAVENOUS | Status: AC
Start: 2017-04-16 — End: 2017-04-16
  Administered 2017-04-16: 09:00:00 via INTRAVENOUS
  Filled 2017-04-16: qty 1000

## 2017-04-16 MED ORDER — HEPARIN SOD (PORK) LOCK FLUSH 100 UNIT/ML IV SOLN
500.0000 [IU] | Freq: Once | INTRAVENOUS | Status: DC
  Filled 2017-04-16: qty 5

## 2017-04-16 MED ORDER — HYDROCHLOROTHIAZIDE 12.5 MG PO CAPS: 12.5000 mg | ORAL_CAPSULE | Freq: Every day | ORAL | 6 refills | Status: DC

## 2017-04-16 MED ORDER — LEUCOVORIN CALCIUM INJECTION 100 MG
20.0000 mg/m2 | Freq: Once | INTRAMUSCULAR | Status: AC
Start: 2017-04-16 — End: 2017-04-16
  Administered 2017-04-16: 28 mg via INTRAVENOUS
  Filled 2017-04-16: qty 1.4

## 2017-04-16 NOTE — Progress Notes (Signed)
[place] CONSULT NOTE  Patient Care Team: Coral Spikes, DO as PCP - General (Family Medicine)  CHIEF COMPLAINTS/PURPOSE OF CONSULTATION:   Oncology History   # [facility] 2017- COLON CANCER- STAGE IV [s/p right hemi-colectomy; pT4apN2 (7/19LN)]; Pre-op CEA- [id number]  PET-  mesenteric adenopathy/mediastinal adenopathy/right-sided subclavicular lymph node.    April 17th -START FOLFOX; May 1st Add Avastin;   Aug 16th- Disc [name] [place] PN]; con 5FU-Avastin; NOV 27th CT C/A/P- CR; except for 34m LUL; 5FU-Avastin q 3 W  # March 2018- L2 uptake MET s/p RT [[facility]2018]; mid April X-geva   # MOLECULAR TESTING: K-ras-EXON 2- MUTATED; MSI- STABLE.      Cancer of ascending colon metastatic to intra-abdominal lymph node ([place]   09/06/2015 Initial Diagnosis    Cancer of ascending colon metastatic to intra-abdominal lymph node (HCC)        HISTORY OF PRESENTING ILLNESS:  MLINDSAY STRAKA89y.o.  female  with a diagnosed Metastatic right-sided colon cancer status post 5-FU plus Avastin; Is here for follow-up/ reviewed the results of the CAT scan.  Patient continues to complain of worsening low back pain. She continues to take hydrocodone as needed. She has mild to moderate tingling and numbness in the extremities. She is on Neurontin/Cymbalta. This is not getting any worse. She continues to take potassium pills. Denies any leg cramps.   ROS: A complete 10 point review of system is done which is negative except mentioned above in history of present illness  MEDICAL HISTORY:  Past Medical History:  Diagnosis Date  . Chicken pox   . Colon cancer ([place]    Partial colectomy 07/2015 + chemo tx's  . Hypertension   . Hypokalemia   . Menopause    > 5 yrs  . Peripheral neuropathy due to chemotherapy ([facility]     SURGICAL HISTORY: Past Surgical History:  Procedure Laterality Date  . BREAST BIOPSY Right    2007? pt unsure done by [facility]  . COLONOSCOPY    . COLONOSCOPY WITH PROPOFOL  N/A 10/14/2015   Procedure: COLONOSCOPY WITH PROPOFOL;  Surgeon: PHulen Luster MD;  [facility];  Service: Endoscopy;  Laterality: N/A;  . COLOSTOMY REVISION Right 08/09/2015   Procedure: COLON RESECTION RIGHT;  Surgeon: RFlorene Glen MD;  Location: [facility] ORS;  Service: General;  Laterality: Right;  . ECTOPIC PREGNANCY SURGERY    . PORTACATH PLACEMENT N/A 09/01/2015   Procedure: INSERTION PORT-A-CATH;  Surgeon: RFlorene Glen MD;  Location: [facility] ORS;  Service: General;  Laterality: N/A;  . SHOULDER SURGERY Left     SOCIAL HISTORY[place]  1 pack in 3 days; no alcohol. 2 childern [25 & 17 years] Social History   Socioeconomic History  . Marital status: Married    Spouse name: Not on file  . Number of children: Not on file  . Years of education: Not on file  . Highest education level: Not on file  Social Needs  . Financial resource strain: Not on file  . Food insecurity - worry: Not on file  . Food insecurity - inability: Not on file  . Transportation needs - medical: Not on file  . Transportation needs - non-medical: Not on file  Occupational History  . Not on file  Tobacco Use  . Smoking status: Former Smoker    Packs/day: 0.25    Years: 2.00    Pack years: 0.50  . Smokeless tobacco: Never Used  . Tobacco  comment: recently quit  Substance and Sexual Activity  . Alcohol use: No    Alcohol/week: 0.0 oz  . Drug use: No  . Sexual activity: Yes  Other Topics Concern  . Not on file  Social History Narrative  . Not on file    FAMILY HISTORY: 1 brother& 1 sister- no cancer. Mom-no cancer Family History  Problem Relation Age of Onset  . Hypertension Mother   . Arthritis Father   . Prostate cancer Father   . Diabetes Maternal Grandmother   . Colon cancer Maternal Grandfather        colon    ALLERGIES:  has No Known Allergies.  MEDICATIONS:  Current Outpatient Medications  Medication Sig Dispense Refill  .  benazepril-hydrochlorthiazide (LOTENSIN HCT) 20-12.5 MG tablet Take 1 tablet by mouth daily. 60 tablet 3  . DULoxetine (CYMBALTA) 20 MG capsule Take 2 capsules (40 mg total) daily by mouth. 60 capsule 2  . feeding supplement (BOOST HIGH PROTEIN) LIQD Take 1 Container by mouth daily.    . ferrous sulfate (FERROUSUL) 325 (65 FE) MG tablet Take 1 tablet (325 mg total) by mouth daily with breakfast. 90 tablet 3  . gabapentin (NEURONTIN) 300 MG capsule One pill in am; and 2 pills at night prior to sleep. 90 capsule 3  . lidocaine-prilocaine (EMLA) cream Apply 1 application topically as needed. Apply generously over the Mediport 45 minutes prior to chemotherapy. 30 g 6  . Multiple Vitamins-Minerals (MULTIVITAMIN ADULT PO) Take 1 tablet by mouth daily.    . potassium chloride (K-DUR) 10 MEQ tablet Take 6 tablets (60 mEq total) by mouth daily. 540 tablet 0  . acetaminophen (TYLENOL) 325 MG tablet Take 2 tablets (650 mg total) by mouth every 6 (six) hours as needed for mild pain (or Fever >/= 101). (Patient not taking: Reported on 04/16/2017)    . benazepril (LOTENSIN) 20 MG tablet Take 1 tablet (20 mg total) by mouth daily. 30 tablet 6  . diphenoxylate-atropine (LOMOTIL) 2.5-0.025 MG tablet Take 1 tablet by mouth 4 (four) times daily as needed for diarrhea or loose stools. Take it along with immodium (Patient not taking: Reported on 04/16/2017) 40 tablet 4  . hydrochlorothiazide (MICROZIDE) 12.5 MG capsule Take 1 capsule (12.5 mg total) by mouth daily. 30 capsule 6  . HYDROcodone-acetaminophen (NORCO/VICODIN) 5-325 MG tablet Take 1 tablet by mouth every 8 (eight) hours as needed for moderate pain. (Patient not taking: Reported on 04/16/2017) 40 tablet 0  . loperamide (IMODIUM) 2 MG capsule Take 2 mg by mouth as needed for diarrhea or loose stools.    . ondansetron (ZOFRAN) 8 MG tablet Take 1 tablet (8 mg total) by mouth every 8 (eight) hours as needed for nausea or vomiting (start 3 days; after chemo).  (Patient not taking: Reported on 04/16/2017) 40 tablet 0  . prochlorperazine (COMPAZINE) 10 MG tablet Take 1 tablet (10 mg total) by mouth every 6 (six) hours as needed for nausea or vomiting. (Patient not taking: Reported on 03/26/2017) 90 tablet 1   No current facility-administered medications for this visit.    Facility-Administered Medications Ordered in Other Visits  Medication Dose Route Frequency Provider Last Rate Last Dose  . sodium chloride flush (NS) 0.9 % injection 10 mL  10 mL Intravenous PRN Cammie Sickle, MD   10 mL at 10/04/15 0901      PHYSICAL EXAMINATION: ECOG PERFORMANCE STATUS: 0 - Asymptomatic  Vitals:   04/16/17 0832  BP: 122/77  Pulse: 80  Resp:  18  Temp: (!) 93.6 F (34.2 C)   Filed Weights   04/16/17 0832  Weight: 111 lb (50.3 kg)    GENERAL: Well-nourished well-developed; thin built; Alert, no distress and comfortable.   Accompanied by her mother.  EYES: no pallor or icterus OROPHARYNX: no thrush or ulceration NECK: supple, no masses felt LYMPH:  no palpable lymphadenopathy in the cervical, axillary or inguinal regions LUNGS: clear to auscultation and  No wheeze or crackles HEART/CVS: regular rate & rhythm and no murmurs; No lower extremity edema ABDOMEN: abdomen soft, non-tender and normal bowel sounds; No infections. Musculoskeletal:no cyanosis of digits and no clubbing  PSYCH: alert & oriented x 3 with fluent speech NEURO: no focal motor/sensory deficits SKIN:  no rashes or significant lesions  LABORATORY DATA:  I have reviewed the data as listed Lab Results  Component Value Date   WBC 4.5 04/16/2017   HGB 12.1 04/16/2017   HCT 36.0 04/16/2017   MCV 94.4 04/16/2017   PLT 141 (L) 04/16/2017   Recent Labs    03/05/17 0857 03/26/17 0835 04/16/17 0808  NA 137 135 138  K 3.2* 4.6 3.2*  CL 104 109 106  CO2 24 20* 24  GLUCOSE 96 91 116*  BUN _0 CREATININE 0.73 0.92 0.89  CALCIUM 8.5* 8.6* 8.8*  GFRNONAA >60 >60 >60   GFRAA >60 >60 >60  PROT 6.4* 6.6 6.3*  ALBUMIN 3.5 3.8 3.5  AST _1 ALT 12* 12* 11*  ALKPHOS 58 60 58  BILITOT 0.3 0.4 0.4   IMPRESSION: 1. Similar left-sided L2 sclerosis which remains indeterminate but suspicious for an atypical appearance of isolated colon cancer metastasis. 2. Otherwise, no evidence of metastatic disease in the chest, abdomen, or pelvis. 3.  Aortic Atherosclerosis (ICD10-I70.0).   Electronically Signed   By: Abigail Miyamoto M.D.   On: 04/10/2017 13:17   ASSESSMENT & PLAN:   .Cancer of ascending colon metastatic to intra-abdominal lymph node ([place]) # Colon cancer- cecal/ right-sided; STAGE IV [intraabdominal/retroperitonel LN/Supracla; K-ras mutated]. Currently on 5FU palliative chemotherapy+ avastin every 3 w. No obvious evidence of clinical progression. Nov 18th 2018- CT-NED; stable- L-2 bone lesion; but back pain worse [see discussion below]   # Labs today reviewed;  acceptable for treatment today- except for- [see below] Mild thrombocytopenia. Again discussed that the treatments are palliative/indefinite.   # Thrombocytopenia platelets 141 today/- G-1. Monitor for now.   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  # Blood pressure- better controlled; refilled the scripts.   # symptomatic L2 bone lesion status post radiation finished May 2018.CT Nov 2018- STABLE; but symptoms are worse; recommend bone scan asap.   # Mild hypokalemia- labs today pending.   # follow up in 3 weeks/labs/UA; chemo;bone scan asap.      GCammie Sickle MD 04/17/2017 7:32 PM

## 2017-04-16 NOTE — Progress Notes (Signed)
Here for follow up. [facility] wont have Benazapril /HCTZ until next April/19. Has 2 weeks worth at this time. Needs ordered seperatly per pt.

## 2017-04-16 NOTE — Progress Notes (Signed)
Spoke with Dr. Rogue Bussing about patient's potassium, he said no potassium today, patient is on p.o. Potassium per Dr. Rogue Bussing.

## 2017-04-16 NOTE — Assessment & Plan Note (Addendum)
#  Colon cancer- cecal/ right-sided; STAGE IV [intraabdominal/retroperitonel LN/Supracla; K-ras mutated]. Currently on 5FU palliative chemotherapy+ avastin every 3 w. No obvious evidence of clinical progression. Nov 18th 2018- CT-NED; stable- L-2 bone lesion; but back pain worse [see discussion below]   # Labs today reviewed;  acceptable for treatment today- except for- [see below] Mild thrombocytopenia. Again discussed that the treatments are palliative/indefinite.   # Thrombocytopenia platelets 141 today/- G-1. Monitor for now.   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  # Blood pressure- better controlled; refilled the scripts.   # symptomatic L2 bone lesion status post radiation finished May 2018.CT Nov 2018- STABLE; but symptoms are worse; recommend bone scan asap.   # Mild hypokalemia- labs today pending.   # follow up in 3 weeks/labs/UA; chemo;bone scan asap.

## 2017-04-17 LAB — CEA: CEA1: 4.6 ng/mL (ref 0.0–4.7)

## 2017-04-18 ENCOUNTER — Inpatient Hospital Stay: Payer: BLUE CROSS/BLUE SHIELD

## 2017-04-18 VITALS — BP 113/71 | HR 69 | Temp 97.7°F | Resp 18

## 2017-04-18 DIAGNOSIS — C182 Malignant neoplasm of ascending colon: Secondary | ICD-10-CM | POA: Diagnosis not present

## 2017-04-18 DIAGNOSIS — C772 Secondary and unspecified malignant neoplasm of intra-abdominal lymph nodes: Principal | ICD-10-CM

## 2017-04-18 MED ORDER — HEPARIN SOD (PORK) LOCK FLUSH 100 UNIT/ML IV SOLN
500.0000 [IU] | Freq: Once | INTRAVENOUS | Status: AC | PRN
  Administered 2017-04-18: 500 [IU]

## 2017-04-18 MED ORDER — HEPARIN SOD (PORK) LOCK FLUSH 100 UNIT/ML IV SOLN
INTRAVENOUS | Status: AC
  Filled 2017-04-18: qty 5

## 2017-04-18 MED ORDER — SODIUM CHLORIDE 0.9% FLUSH
10.0000 mL | INTRAVENOUS | Status: DC | PRN
  Administered 2017-04-18: 10 mL
  Filled 2017-04-18: qty 10

## 2017-04-20 ENCOUNTER — Encounter
Admission: RE | Admit: 2017-04-20 | Discharge: 2017-04-20 | Disposition: A | Discharge status: Home / Self Care | Payer: BLUE CROSS/BLUE SHIELD | Source: Ambulatory Visit | Attending: Internal Medicine | Admitting: Internal Medicine

## 2017-04-20 DIAGNOSIS — C772 Secondary and unspecified malignant neoplasm of intra-abdominal lymph nodes: Secondary | ICD-10-CM | POA: Insufficient documentation

## 2017-04-20 DIAGNOSIS — C182 Malignant neoplasm of ascending colon: Secondary | ICD-10-CM | POA: Insufficient documentation

## 2017-04-20 MED ORDER — TECHNETIUM TC 99M MEDRONATE IV KIT
25.0000 | PACK | Freq: Once | INTRAVENOUS | Status: AC | PRN
  Administered 2017-04-20: 23.77 via INTRAVENOUS

## 2017-05-07 ENCOUNTER — Inpatient Hospital Stay: Payer: BLUE CROSS/BLUE SHIELD | Attending: Internal Medicine

## 2017-05-07 ENCOUNTER — Telehealth: Payer: Self-pay | Admitting: Internal Medicine

## 2017-05-07 ENCOUNTER — Inpatient Hospital Stay (HOSPITAL_BASED_OUTPATIENT_CLINIC_OR_DEPARTMENT_OTHER): Payer: BLUE CROSS/BLUE SHIELD | Admitting: Internal Medicine

## 2017-05-07 ENCOUNTER — Inpatient Hospital Stay: Payer: BLUE CROSS/BLUE SHIELD

## 2017-05-07 VITALS — BP 121/78 | HR 74 | Temp 98.1°F | Resp 16 | Wt 113.4 lb

## 2017-05-07 DIAGNOSIS — I10 Essential (primary) hypertension: Secondary | ICD-10-CM

## 2017-05-07 DIAGNOSIS — C182 Malignant neoplasm of ascending colon: Secondary | ICD-10-CM | POA: Diagnosis present

## 2017-05-07 DIAGNOSIS — Z9049 Acquired absence of other specified parts of digestive tract: Secondary | ICD-10-CM

## 2017-05-07 DIAGNOSIS — E876 Hypokalemia: Secondary | ICD-10-CM

## 2017-05-07 DIAGNOSIS — Z79899 Other long term (current) drug therapy: Secondary | ICD-10-CM

## 2017-05-07 DIAGNOSIS — Z5112 Encounter for antineoplastic immunotherapy: Secondary | ICD-10-CM | POA: Insufficient documentation

## 2017-05-07 DIAGNOSIS — Z9221 Personal history of antineoplastic chemotherapy: Secondary | ICD-10-CM | POA: Diagnosis not present

## 2017-05-07 DIAGNOSIS — Z8042 Family history of malignant neoplasm of prostate: Secondary | ICD-10-CM

## 2017-05-07 DIAGNOSIS — D696 Thrombocytopenia, unspecified: Secondary | ICD-10-CM

## 2017-05-07 DIAGNOSIS — Z800 Family history of malignant neoplasm of digestive organs: Secondary | ICD-10-CM | POA: Diagnosis not present

## 2017-05-07 DIAGNOSIS — Z87891 Personal history of nicotine dependence: Secondary | ICD-10-CM

## 2017-05-07 DIAGNOSIS — C772 Secondary and unspecified malignant neoplasm of intra-abdominal lymph nodes: Principal | ICD-10-CM

## 2017-05-07 DIAGNOSIS — Z923 Personal history of irradiation: Secondary | ICD-10-CM | POA: Insufficient documentation

## 2017-05-07 DIAGNOSIS — C7951 Secondary malignant neoplasm of bone: Secondary | ICD-10-CM | POA: Diagnosis not present

## 2017-05-07 DIAGNOSIS — I700 Atherosclerosis of aorta: Secondary | ICD-10-CM | POA: Diagnosis not present

## 2017-05-07 LAB — COMPREHENSIVE METABOLIC PANEL
ALBUMIN: 3.4 g/dL — AB (ref 3.5–5.0)
ALT: 13 U/L — AB (ref 14–54)
ANION GAP: 6 (ref 5–15)
AST: 17 U/L (ref 15–41)
Alkaline Phosphatase: 65 U/L (ref 38–126)
BUN: 11 mg/dL (ref 6–20)
CHLORIDE: 108 mmol/L (ref 101–111)
CO2: 21 mmol/L — AB (ref 22–32)
Calcium: 8.7 mg/dL — ABNORMAL LOW (ref 8.9–10.3)
Creatinine, Ser: 0.77 mg/dL (ref 0.44–1.00)
GFR calc non Af Amer: >60 mL/min (ref >60)
GLUCOSE: 96 mg/dL (ref 65–99)
Potassium: 4.6 mmol/L (ref 3.5–5.1)
SODIUM: 135 mmol/L (ref 135–145)
Total Bilirubin: 0.2 mg/dL — ABNORMAL LOW (ref 0.3–1.2)
Total Protein: 6.5 g/dL (ref 6.5–8.1)

## 2017-05-07 LAB — CBC WITH DIFFERENTIAL/PLATELET
Basophils Absolute: 0 10*3/uL (ref 0–0.1)
Basophils Relative: 1 %
Eosinophils Absolute: 0.1 10*3/uL (ref 0–0.7)
Eosinophils Relative: 2 %
HEMATOCRIT: 35.9 % (ref 35.0–47.0)
HEMOGLOBIN: 11.8 g/dL — AB (ref 12.0–16.0)
LYMPHS ABS: 1 10*3/uL (ref 1.0–3.6)
Lymphocytes Relative: 23 %
MCH: 31.7 pg (ref 26.0–34.0)
MCHC: 33 g/dL (ref 32.0–36.0)
MCV: 96.3 fL (ref 80.0–100.0)
MONO ABS: 0.6 10*3/uL (ref 0.2–0.9)
MONOS PCT: 13 %
NEUTROS ABS: 2.6 10*3/uL (ref 1.4–6.5)
NEUTROS PCT: 61 %
Platelets: 130 10*3/uL — ABNORMAL LOW (ref 150–440)
RBC: 3.73 MIL/uL — ABNORMAL LOW (ref 3.80–5.20)
RDW: 17.8 % — ABNORMAL HIGH (ref 11.5–14.5)
WBC: 4.2 10*3/uL (ref 3.6–11.0)

## 2017-05-07 LAB — URINALYSIS, COMPLETE (UACMP) WITH MICROSCOPIC
BACTERIA UA: NONE SEEN
Bilirubin Urine: NEGATIVE
Glucose, UA: NEGATIVE mg/dL
HGB URINE DIPSTICK: NEGATIVE
Ketones, ur: NEGATIVE mg/dL
Nitrite: NEGATIVE
PROTEIN: NEGATIVE mg/dL
SPECIFIC GRAVITY, URINE: 1.017 (ref 1.005–1.030)
pH: 5 (ref 5.0–8.0)

## 2017-05-07 MED ORDER — ONDANSETRON HCL 4 MG/2ML IJ SOLN
8.0000 mg | Freq: Once | INTRAMUSCULAR | Status: AC
  Administered 2017-05-07: 8 mg via INTRAVENOUS
  Filled 2017-05-07: qty 4

## 2017-05-07 MED ORDER — SODIUM CHLORIDE 0.9 % IV SOLN
250.0000 mg | Freq: Once | INTRAVENOUS | Status: AC
  Administered 2017-05-07: 250 mg via INTRAVENOUS
  Filled 2017-05-07: qty 10

## 2017-05-07 MED ORDER — SODIUM CHLORIDE 0.9 % IV SOLN
2400.0000 mg/m2 | INTRAVENOUS | Status: DC
  Administered 2017-05-07: 3350 mg via INTRAVENOUS
  Filled 2017-05-07: qty 67

## 2017-05-07 MED ORDER — SODIUM CHLORIDE 0.9 % IV SOLN: Freq: Once | INTRAVENOUS | Status: DC

## 2017-05-07 MED ORDER — DEXAMETHASONE SODIUM PHOSPHATE 10 MG/ML IJ SOLN
10.0000 mg | Freq: Once | INTRAMUSCULAR | Status: AC
  Administered 2017-05-07: 10 mg via INTRAVENOUS
  Filled 2017-05-07: qty 1

## 2017-05-07 MED ORDER — LEUCOVORIN CALCIUM INJECTION 350 MG: 400.0000 mg/m2 | Freq: Once | INTRAVENOUS | Status: DC

## 2017-05-07 MED ORDER — HYDROCODONE-ACETAMINOPHEN 5-325 MG PO TABS
1.0000 | ORAL_TABLET | Freq: Three times a day (TID) | ORAL | 0 refills | Status: DC | PRN
Start: 2017-05-07 — End: 2017-07-30

## 2017-05-07 MED ORDER — LEUCOVORIN CALCIUM INJECTION 100 MG
20.0000 mg/m2 | Freq: Once | INTRAMUSCULAR | Status: AC
  Administered 2017-05-07: 28 mg via INTRAVENOUS
  Filled 2017-05-07: qty 1.4

## 2017-05-07 MED ORDER — SODIUM CHLORIDE 0.9 % IV SOLN
Freq: Once | INTRAVENOUS | Status: AC
  Administered 2017-05-07: 10:00:00 via INTRAVENOUS
  Filled 2017-05-07: qty 1000

## 2017-05-07 MED ORDER — SODIUM CHLORIDE 0.9% FLUSH
10.0000 mL | INTRAVENOUS | Status: DC | PRN
  Administered 2017-05-07: 10 mL via INTRAVENOUS
  Filled 2017-05-07: qty 10

## 2017-05-07 NOTE — Assessment & Plan Note (Addendum)
#  Colon cancer- cecal/ right-sided; STAGE IV [intraabdominal/retroperitonel LN/Supracla; K-ras mutated]. Currently on 5FU palliative chemotherapy+ avastin every 3 w. No obvious evidence of clinical progression. Nov 18th 2018- CT-NED; stable- L-2 bone lesion; Dec 2018- Bone scan- persistent uptake in L2 vertebral body area [see discussion below]  # proceed with 5 FU + avastin q 3week. Labs today reviewed;  acceptable for treatment today- except for- [see below] Mild thrombocytopenia.   # again discussed re: treatments are palliative.;  Long discussion regarding continued chemotherapy versus chemo holiday. I think its okay with chemo holiday.  # Thrombocytopenia platelets 130 today/- G-1. Monitor for now.   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  # symptomatic L2 bone lesion status post radiation finished May 2018.CT Nov 2018/DEC 2018 Bone scan- STABLE; but symptoms are worse; recommend MRI of spine. Discussed with with Dr.Crystal.   # follow up in 4 weeks/labs/CEA; NO chemo; add X-geva.

## 2017-05-07 NOTE — Telephone Encounter (Signed)
Please schedule patient for Delton See in 4 weeks/when the patient sees me. Thx

## 2017-05-07 NOTE — Progress Notes (Signed)
[place] CONSULT NOTE  Patient Care Team: Coral Spikes, DO as PCP - General (Family Medicine)  CHIEF COMPLAINTS/PURPOSE OF CONSULTATION:   Oncology History   # [facility] 2017- COLON CANCER- STAGE IV [s/p right hemi-colectomy; pT4apN2 (7/19LN)]; Pre-op CEA- [id number]  PET-  mesenteric adenopathy/mediastinal adenopathy/right-sided subclavicular lymph node.    April 17th -START FOLFOX; May 1st Add Avastin;   Aug 16th- Disc [name] [place] PN]; con 5FU-Avastin; NOV 27th CT C/A/P- CR; except for 50m LUL; 5FU-Avastin q 3 W  # March 2018- L2 uptake MET s/p RT [[facility]2018]; mid April X-geva   # MOLECULAR TESTING: K-ras-EXON 2- MUTATED; MSI- STABLE.      Cancer of ascending colon metastatic to intra-abdominal lymph node ([place]   09/06/2015 Initial Diagnosis    Cancer of ascending colon metastatic to intra-abdominal lymph node (HCC)        HISTORY OF PRESENTING ILLNESS:  Nancy SUNDBERG89y.o.  female  with a diagnosed Metastatic right-sided colon cancer status post 5-FU plus Avastin; Is here for follow-up/ reviewed the results of the bone scan.  Patient states that her back pain is stable; she has needingto take hydrocodone 1 pill 2 or 3 times a day. She has mild to moderate tingling and numbness in the extremities. She is on Neurontin/Cymbalta. This is not getting any worse.   Otherwise no nausea no vomiting no headaches.  No diarrhea no sores in the mouth.  ROS: A complete 10 point review of system is done which is negative except mentioned above in history of present illness  MEDICAL HISTORY:  Past Medical History:  Diagnosis Date  . Chicken pox   . Colon cancer ([place]    Partial colectomy 07/2015 + chemo tx's  . Hypertension   . Hypokalemia   . Menopause    > 5 yrs  . Peripheral neuropathy due to chemotherapy ([facility]     SURGICAL HISTORY: Past Surgical History:  Procedure Laterality Date  . BREAST BIOPSY Right    2007? pt unsure done by [facility]  . COLONOSCOPY     . COLONOSCOPY WITH PROPOFOL N/A 10/14/2015   Procedure: COLONOSCOPY WITH PROPOFOL;  Surgeon: PHulen Luster MD;  [facility];  Service: Endoscopy;  Laterality: N/A;  . COLOSTOMY REVISION Right 08/09/2015   Procedure: COLON RESECTION RIGHT;  Surgeon: RFlorene Glen MD;  Location: [facility] ORS;  Service: General;  Laterality: Right;  . ECTOPIC PREGNANCY SURGERY    . PORTACATH PLACEMENT N/A 09/01/2015   Procedure: INSERTION PORT-A-CATH;  Surgeon: RFlorene Glen MD;  Location: [facility] ORS;  Service: General;  Laterality: N/A;  . SHOULDER SURGERY Left     [place]IAL H[place]  1 pack in 3 days; no alcohol. 2 childern [25 & 17 years] Social History   Socioeconomic History  . Marital status: Married    Spouse name: Not on file  . Number of children: Not on file  . Years of education: Not on file  . Highest education level: Not on file  Social Needs  . Financial resource strain: Not on file  . Food insecurity - worry: Not on file  . Food insecurity - inability: Not on file  . Transportation needs - medical: Not on file  . Transportation needs - non-medical: Not on file  Occupational History  . Not on file  Tobacco Use  . Smoking status: Former Smoker    Packs/day: 0.25    Years: 2.00    Pack  years: 0.50  . Smokeless tobacco: Never Used  . Tobacco comment: recently quit  Substance and Sexual Activity  . Alcohol use: No    Alcohol/week: 0.0 oz  . Drug use: No  . Sexual activity: Yes  Other Topics Concern  . Not on file  Social History Narrative  . Not on file    FAMILY HISTORY: 1 brother& 1 sister- no cancer. Mom-no cancer Family History  Problem Relation Age of Onset  . Hypertension Mother   . Arthritis Father   . Prostate cancer Father   . Diabetes Maternal Grandmother   . Colon cancer Maternal Grandfather        colon    ALLERGIES:  has No Known Allergies.  MEDICATIONS:  Current Outpatient Medications  Medication Sig  Dispense Refill  . lidocaine-prilocaine (EMLA) cream Apply 1 application topically as needed. Apply generously over the Mediport 45 minutes prior to chemotherapy. 30 g 6  . ondansetron (ZOFRAN) 8 MG tablet Take 1 tablet (8 mg total) by mouth every 8 (eight) hours as needed for nausea or vomiting (start 3 days; after chemo). 40 tablet 0  . prochlorperazine (COMPAZINE) 10 MG tablet Take 1 tablet (10 mg total) by mouth every 6 (six) hours as needed for nausea or vomiting. 90 tablet 1  . acetaminophen (TYLENOL) 325 MG tablet Take 2 tablets (650 mg total) by mouth every 6 (six) hours as needed for mild pain (or Fever >/= 101). (Patient not taking: Reported on 04/16/2017)    . benazepril (LOTENSIN) 20 MG tablet Take 1 tablet (20 mg total) by mouth daily. 30 tablet 6  . benazepril-hydrochlorthiazide (LOTENSIN HCT) 20-12.5 MG tablet Take 1 tablet by mouth daily. 60 tablet 3  . diphenoxylate-atropine (LOMOTIL) 2.5-0.025 MG tablet Take 1 tablet by mouth 4 (four) times daily as needed for diarrhea or loose stools. Take it along with immodium (Patient not taking: Reported on 04/16/2017) 40 tablet 4  . DULoxetine (CYMBALTA) 20 MG capsule Take 2 capsules (40 mg total) daily by mouth. 60 capsule 2  . feeding supplement (BOOST HIGH PROTEIN) LIQD Take 1 Container by mouth daily.    . ferrous sulfate (FERROUSUL) 325 (65 FE) MG tablet Take 1 tablet (325 mg total) by mouth daily with breakfast. 90 tablet 3  . gabapentin (NEURONTIN) 300 MG capsule One pill in am; and 2 pills at night prior to sleep. 90 capsule 3  . hydrochlorothiazide (MICROZIDE) 12.5 MG capsule Take 1 capsule (12.5 mg total) by mouth daily. 30 capsule 6  . HYDROcodone-acetaminophen (NORCO/VICODIN) 5-325 MG tablet Take 1 tablet by mouth every 8 (eight) hours as needed for moderate pain. 90 tablet 0  . loperamide (IMODIUM) 2 MG capsule Take 2 mg by mouth as needed for diarrhea or loose stools.    . Multiple Vitamins-Minerals (MULTIVITAMIN ADULT PO) Take 1  tablet by mouth daily.    . potassium chloride (K-DUR) 10 MEQ tablet Take 6 tablets (60 mEq total) by mouth daily. 540 tablet 0   No current facility-administered medications for this visit.    Facility-Administered Medications Ordered in Other Visits  Medication Dose Route Frequency Provider Last Rate Last Dose  . fluorouracil (ADRUCIL) 3,350 mg in sodium chloride 0.9 % 83 mL chemo infusion  2,400 mg/m2 (Treatment Plan Recorded) Intravenous 1 day or 1 dose Charlaine Dalton R, MD   3,350 mg at 05/07/17 1045  . sodium chloride flush (NS) 0.9 % injection 10 mL  10 mL Intravenous PRN Cammie Sickle, MD   10  mL at 10/04/15 0901  . sodium chloride flush (NS) 0.9 % injection 10 mL  10 mL Intravenous PRN Cammie Sickle, MD   10 mL at 05/07/17 0833      PHYSICAL EXAMINATION: ECOG PERFORMANCE STATUS: 0 - Asymptomatic  Vitals:   05/07/17 0853  BP: 121/78  Pulse: 74  Resp: 16  Temp: 98.1 F (36.7 C)   Filed Weights   05/07/17 0853  Weight: 113 lb 6.4 oz (51.4 kg)    GENERAL: Well-nourished well-developed; thin built; Alert, no distress and comfortable.   Accompanied by her mother.  EYES: no pallor or icterus OROPHARYNX: no thrush or ulceration NECK: supple, no masses felt LYMPH:  no palpable lymphadenopathy in the cervical, axillary or inguinal regions LUNGS: clear to auscultation and  No wheeze or crackles HEART/CVS: regular rate & rhythm and no murmurs; No lower extremity edema ABDOMEN: abdomen soft, non-tender and normal bowel sounds; No infections. Musculoskeletal:no cyanosis of digits and no clubbing  PSYCH: alert & oriented x 3 with fluent speech NEURO: no focal motor/sensory deficits SKIN:  no rashes or significant lesions  LABORATORY DATA:  I have reviewed the data as listed Lab Results  Component Value Date   WBC 4.2 05/07/2017   HGB 11.8 (L) 05/07/2017   HCT 35.9 05/07/2017   MCV 96.3 05/07/2017   PLT 130 (L) 05/07/2017   Recent Labs     03/26/17 0835 04/16/17 0808 05/07/17 0846  NA 135 138 135  K 4.6 3.2* 4.6  CL 109 106 108  CO2 20* 24 21*  GLUCOSE 91 116* 96  BUN 19 19 11   CREATININE 0.92 0.89 0.77  CALCIUM 8.6* 8.8* 8.7*  GFRNONAA >60 >60 >60  GFRAA >60 >60 >60  PROT 6.6 6.3* 6.5  ALBUMIN 3.8 3.5 3.4*  AST 16 16 17   ALT 12* 11* 13*  ALKPHOS 60 58 65  BILITOT 0.4 0.4 0.2*   IMPRESSION: 1. Similar left-sided L2 sclerosis which remains indeterminate but suspicious for an atypical appearance of isolated colon cancer metastasis. 2. Otherwise, no evidence of metastatic disease in the chest, abdomen, or pelvis. 3.  Aortic Atherosclerosis (ICD10-I70.0).   Electronically Signed   By: Abigail Miyamoto M.D.   On: 04/10/2017 13:17  IMPRESSION: Persistent uptake of tracer at the LEFT lateral aspect of the L2 vertebral body, corresponding to an area of sclerosis on CT, cannot exclude metastasis.  No new scintigraphic abnormalities identified.   Electronically Signed   By: Lavonia Dana M.D.   On: 04/20/2017 17:21   ASSESSMENT & PLAN:   .Cancer of ascending colon metastatic to intra-abdominal lymph node ([place]) # Colon cancer- cecal/ right-sided; STAGE IV [intraabdominal/retroperitonel LN/Supracla; K-ras mutated]. Currently on 5FU palliative chemotherapy+ avastin every 3 w. No obvious evidence of clinical progression. Nov 18th 2018- CT-NED; stable- L-2 bone lesion; Dec 2018- Bone scan- persistent uptake in L2 vertebral body area [see discussion below]  # proceed with 5 FU + avastin q 3week. Labs today reviewed;  acceptable for treatment today- except for- [see below] Mild thrombocytopenia.   # again discussed re: treatments are palliative.;  Interested in chemo holiday.  # Thrombocytopenia platelets 130 today/- G-1. Monitor for now.   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  # symptomatic L2 bone lesion status post radiation finished May 2018.CT Nov 2018/DEC  2018 Bone scan- STABLE; but symptoms are worse; recommend MRI of spine. Discussed with with Dr.Crystal.   # Mild hypokalemia- labs today pending.   #  follow up in 4 weeks/labs/CEA; NO chemo     Cammie Sickle, MD 05/07/2017 1:06 PM

## 2017-05-07 NOTE — Telephone Encounter (Signed)
msg sent to schedulers 

## 2017-05-08 LAB — CEA: CEA1: 5 ng/mL — AB (ref 0.0–4.7)

## 2017-05-09 ENCOUNTER — Inpatient Hospital Stay: Payer: BLUE CROSS/BLUE SHIELD

## 2017-05-09 VITALS — BP 118/75 | HR 75 | Temp 98.0°F | Resp 18

## 2017-05-09 DIAGNOSIS — C182 Malignant neoplasm of ascending colon: Secondary | ICD-10-CM | POA: Diagnosis not present

## 2017-05-09 DIAGNOSIS — C772 Secondary and unspecified malignant neoplasm of intra-abdominal lymph nodes: Principal | ICD-10-CM

## 2017-05-09 MED ORDER — HEPARIN SOD (PORK) LOCK FLUSH 100 UNIT/ML IV SOLN
500.0000 [IU] | Freq: Once | INTRAVENOUS | Status: AC
  Administered 2017-05-09: 500 [IU] via INTRAVENOUS

## 2017-05-09 MED ORDER — SODIUM CHLORIDE 0.9% FLUSH
10.0000 mL | INTRAVENOUS | Status: DC | PRN
  Administered 2017-05-09: 10 mL
  Filled 2017-05-09: qty 10

## 2017-05-09 MED ORDER — HEPARIN SOD (PORK) LOCK FLUSH 100 UNIT/ML IV SOLN
INTRAVENOUS | Status: AC
  Filled 2017-05-09: qty 5

## 2017-06-04 ENCOUNTER — Inpatient Hospital Stay (HOSPITAL_BASED_OUTPATIENT_CLINIC_OR_DEPARTMENT_OTHER): Payer: BLUE CROSS/BLUE SHIELD | Admitting: Internal Medicine

## 2017-06-04 ENCOUNTER — Encounter: Payer: Self-pay | Admitting: Internal Medicine

## 2017-06-04 ENCOUNTER — Inpatient Hospital Stay: Payer: BLUE CROSS/BLUE SHIELD | Attending: Internal Medicine

## 2017-06-04 ENCOUNTER — Inpatient Hospital Stay: Payer: BLUE CROSS/BLUE SHIELD

## 2017-06-04 VITALS — BP 122/78 | HR 69 | Temp 97.7°F | Resp 16 | Wt 112.4 lb

## 2017-06-04 DIAGNOSIS — C7951 Secondary malignant neoplasm of bone: Secondary | ICD-10-CM

## 2017-06-04 DIAGNOSIS — D696 Thrombocytopenia, unspecified: Secondary | ICD-10-CM | POA: Insufficient documentation

## 2017-06-04 DIAGNOSIS — Z923 Personal history of irradiation: Secondary | ICD-10-CM | POA: Insufficient documentation

## 2017-06-04 DIAGNOSIS — C182 Malignant neoplasm of ascending colon: Secondary | ICD-10-CM | POA: Diagnosis not present

## 2017-06-04 DIAGNOSIS — G893 Neoplasm related pain (acute) (chronic): Secondary | ICD-10-CM | POA: Diagnosis not present

## 2017-06-04 DIAGNOSIS — M549 Dorsalgia, unspecified: Secondary | ICD-10-CM

## 2017-06-04 DIAGNOSIS — G620 Drug-induced polyneuropathy: Secondary | ICD-10-CM

## 2017-06-04 DIAGNOSIS — G8929 Other chronic pain: Secondary | ICD-10-CM

## 2017-06-04 DIAGNOSIS — C772 Secondary and unspecified malignant neoplasm of intra-abdominal lymph nodes: Secondary | ICD-10-CM

## 2017-06-04 DIAGNOSIS — J069 Acute upper respiratory infection, unspecified: Secondary | ICD-10-CM | POA: Diagnosis not present

## 2017-06-04 LAB — CBC WITH DIFFERENTIAL/PLATELET
BASOS PCT: 1 %
Basophils Absolute: 0 10*3/uL (ref 0–0.1)
EOS ABS: 0.1 10*3/uL (ref 0–0.7)
Eosinophils Relative: 2 %
HCT: 38.5 % (ref 35.0–47.0)
HEMOGLOBIN: 12.5 g/dL (ref 12.0–16.0)
LYMPHS ABS: 0.8 10*3/uL — AB (ref 1.0–3.6)
Lymphocytes Relative: 11 %
MCH: 31.3 pg (ref 26.0–34.0)
MCHC: 32.5 g/dL (ref 32.0–36.0)
MCV: 96.2 fL (ref 80.0–100.0)
Monocytes Absolute: 0.7 10*3/uL (ref 0.2–0.9)
Monocytes Relative: 9 %
NEUTROS ABS: 5.8 10*3/uL (ref 1.4–6.5)
NEUTROS PCT: 77 %
Platelets: 137 10*3/uL — ABNORMAL LOW (ref 150–440)
RBC: 4 MIL/uL (ref 3.80–5.20)
RDW: 17.1 % — ABNORMAL HIGH (ref 11.5–14.5)
WBC: 7.4 10*3/uL (ref 3.6–11.0)

## 2017-06-04 LAB — COMPREHENSIVE METABOLIC PANEL
ALT: 12 U/L — AB (ref 14–54)
AST: 21 U/L (ref 15–41)
Albumin: 3.7 g/dL (ref 3.5–5.0)
Alkaline Phosphatase: 50 U/L (ref 38–126)
Anion gap: 9 (ref 5–15)
BUN: 15 mg/dL (ref 6–20)
CO2: 27 mmol/L (ref 22–32)
Calcium: 8.9 mg/dL (ref 8.9–10.3)
Chloride: 100 mmol/L — ABNORMAL LOW (ref 101–111)
Creatinine, Ser: 0.82 mg/dL (ref 0.44–1.00)
Glucose, Bld: 96 mg/dL (ref 65–99)
Potassium: 3.5 mmol/L (ref 3.5–5.1)
SODIUM: 136 mmol/L (ref 135–145)
Total Bilirubin: 0.4 mg/dL (ref 0.3–1.2)
Total Protein: 6.7 g/dL (ref 6.5–8.1)

## 2017-06-04 NOTE — Assessment & Plan Note (Addendum)
#  Colon cancer- cecal/ right-sided; STAGE IV [intraabdominal/retroperitonel LN/Supracla; K-ras mutated]. Currently on 5FU palliative chemotherapy+ avastin every 3 w. No obvious evidence of clinical progression. Nov 18th 2018- CT-NED; stable- L-2 bone lesion; Dec 2018- Bone scan- persistent uptake in L2 vertebral body area [see discussion below]  #Continue chemo holiday for now; last chemotherapy middle of 2018.  No obvious progression of disease.  CEA around 5; reviewed with the patient.  Monitor for now.  # Thrombocytopenia platelets 140s today/- G-1. Monitor for now.   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  # URI-recommend Claritin+D  # symptomatic L2 bone lesion status post radiation finished May 2018.CT Nov 2018/DEC 2018 Bone scan- STABLE; but symptoms are worse; recommend MRI of thoracic lumbar spine. Discussed with with Dr.Crystal-not too keen on irradiation.  #Metastatic disease to the bone discussed regarding Xgeva- ; patient awaiting dental extraction.  # follow up in 4 weeks/labs/CEA; NO chemo; MRI ASAP.will call re: results of the MRI-possible radiation.

## 2017-06-04 NOTE — Progress Notes (Signed)
[place] CONSULT NOTE  Patient Care Team: Coral Spikes, DO as PCP - General (Family Medicine)  CHIEF COMPLAINTS/PURPOSE OF CONSULTATION:   Oncology History   # [facility] 2017- COLON CANCER- STAGE IV [s/p right hemi-colectomy; pT4apN2 (7/19LN)]; Pre-op CEA- [id number]  PET-  mesenteric adenopathy/mediastinal adenopathy/right-sided subclavicular lymph node.    April 17th -START FOLFOX; May 1st Add Avastin;   Aug 16th- Disc [name] [place] PN]; con 5FU-Avastin; NOV 27th CT C/A/P- CR; except for 62m LUL; 5FU-Avastin q 3 W  # March 2018- L2 uptake MET s/p RT [[facility]2018]; mid April X-geva   # MOLECULAR TESTING: K-ras-EXON 2- MUTATED; MSI- STABLE.      Cancer of ascending colon metastatic to intra-abdominal lymph node ([place]   09/06/2015 Initial Diagnosis    Cancer of ascending colon metastatic to intra-abdominal lymph node (HCC)        HISTORY OF PRESENTING ILLNESS:  Nancy EISENHUTH89y.o.  female  with a diagnosed Metastatic right-sided colon cancer status post 5-FU plus Avastin-currently on chemo holiday is here for follow-up.  Patient continues to complain of back pain thoracic/lumbar spine area-has been taking hydrocodone up to 2 pills a day.  She continues to have tingling and numbness of extremities for which she is on Neurontin/Cymbalta.  Patient feels that she has more energy of chemotherapy.  Appetite improving.  Otherwise no nausea no vomiting no headaches.  No diarrhea no sores in the mouth.  Patient complains of nasal stuffiness congestion the last few days.  She has not taking any over-the-counter medications.  No fevers.;   ROS: A complete 10 point review of system is done which is negative except mentioned above in history of present illness  MEDICAL HISTORY:  Past Medical History:  Diagnosis Date  . Chicken pox   . Colon cancer ([facility]    Partial colectomy 07/2015 + chemo tx's  . Hypertension   . Hypokalemia   . Menopause    > 5 yrs  . Peripheral  neuropathy due to chemotherapy ([facility]     SURGICAL HISTORY: Past Surgical History:  Procedure Laterality Date  . BREAST BIOPSY Right    2007? pt unsure done by [facility]  . COLONOSCOPY    . COLONOSCOPY WITH PROPOFOL N/A 10/14/2015   Procedure: COLONOSCOPY WITH PROPOFOL;  Surgeon: PHulen Luster MD;  [facility];  Service: Endoscopy;  Laterality: N/A;  . COLOSTOMY REVISION Right 08/09/2015   Procedure: COLON RESECTION RIGHT;  Surgeon: RFlorene Glen MD;  Location: [facility] ORS;  Service: General;  Laterality: Right;  . ECTOPIC PREGNANCY SURGERY    . PORTACATH PLACEMENT N/A 09/01/2015   Procedure: INSERTION PORT-A-CATH;  Surgeon: RFlorene Glen MD;  Location: [facility] ORS;  Service: General;  Laterality: N/A;  . SHOULDER SURGERY Left     [place]IAL H[place]  1 pack in 3 days; no alcohol. 2 childern [25 & 17 years] Social History   Socioeconomic History  . Marital status: Married    Spouse name: Not on file  . Number of children: Not on file  . Years of education: Not on file  . Highest education level: Not on file  Social Needs  . Financial resource strain: Not on file  . Food insecurity - worry: Not on file  . Food insecurity - inability: Not on file  . Transportation needs - medical: Not on file  . Transportation needs - non-medical: Not on file  Occupational History  . Not on  file  Tobacco Use  . Smoking status: Former Smoker    Packs/day: 0.25    Years: 2.00    Pack years: 0.50  . Smokeless tobacco: Never Used  . Tobacco comment: recently quit  Substance and Sexual Activity  . Alcohol use: No    Alcohol/week: 0.0 oz  . Drug use: No  . Sexual activity: Yes  Other Topics Concern  . Not on file  Social History Narrative  . Not on file    FAMILY HISTORY: 1 brother& 1 sister- no cancer. Mom-no cancer Family History  Problem Relation Age of Onset  . Hypertension Mother   . Arthritis Father   . Prostate cancer Father   . Diabetes  Maternal Grandmother   . Colon cancer Maternal Grandfather        colon    ALLERGIES:  has No Known Allergies.  MEDICATIONS:  Current Outpatient Medications  Medication Sig Dispense Refill  . acetaminophen (TYLENOL) 325 MG tablet Take 2 tablets (650 mg total) by mouth every 6 (six) hours as needed for mild pain (or Fever >/= 101).    . benazepril (LOTENSIN) 20 MG tablet Take 1 tablet (20 mg total) by mouth daily. 30 tablet 6  . benazepril-hydrochlorthiazide (LOTENSIN HCT) 20-12.5 MG tablet Take 1 tablet by mouth daily. 60 tablet 3  . diphenoxylate-atropine (LOMOTIL) 2.5-0.025 MG tablet Take 1 tablet by mouth 4 (four) times daily as needed for diarrhea or loose stools. Take it along with immodium 40 tablet 4  . DULoxetine (CYMBALTA) 20 MG capsule Take 2 capsules (40 mg total) daily by mouth. 60 capsule 2  . feeding supplement (BOOST HIGH PROTEIN) LIQD Take 1 Container by mouth daily.    . ferrous sulfate (FERROUSUL) 325 (65 FE) MG tablet Take 1 tablet (325 mg total) by mouth daily with breakfast. 90 tablet 3  . gabapentin (NEURONTIN) 300 MG capsule One pill in am; and 2 pills at night prior to sleep. 90 capsule 3  . hydrochlorothiazide (MICROZIDE) 12.5 MG capsule Take 1 capsule (12.5 mg total) by mouth daily. 30 capsule 6  . HYDROcodone-acetaminophen (NORCO/VICODIN) 5-325 MG tablet Take 1 tablet by mouth every 8 (eight) hours as needed for moderate pain. 90 tablet 0  . lidocaine-prilocaine (EMLA) cream Apply 1 application topically as needed. Apply generously over the Mediport 45 minutes prior to chemotherapy. 30 g 6  . loperamide (IMODIUM) 2 MG capsule Take 2 mg by mouth as needed for diarrhea or loose stools.    . Multiple Vitamins-Minerals (MULTIVITAMIN ADULT PO) Take 1 tablet by mouth daily.    . ondansetron (ZOFRAN) 8 MG tablet Take 1 tablet (8 mg total) by mouth every 8 (eight) hours as needed for nausea or vomiting (start 3 days; after chemo). 40 tablet 0  . potassium chloride (K-DUR)  10 MEQ tablet Take 6 tablets (60 mEq total) by mouth daily. 540 tablet 0  . prochlorperazine (COMPAZINE) 10 MG tablet Take 1 tablet (10 mg total) by mouth every 6 (six) hours as needed for nausea or vomiting. 90 tablet 1   No current facility-administered medications for this visit.    Facility-Administered Medications Ordered in Other Visits  Medication Dose Route Frequency Provider Last Rate Last Dose  . sodium chloride flush (NS) 0.9 % injection 10 mL  10 mL Intravenous PRN Cammie Sickle, MD   10 mL at 10/04/15 0901      PHYSICAL EXAMINATION: ECOG PERFORMANCE STATUS: 0 - Asymptomatic  Vitals:   06/04/17 1141  BP: 122/78  Pulse: 69  Resp: 16  Temp: 97.7 F (36.5 C)   Filed Weights   06/04/17 1141  Weight: 112 lb 6.4 oz (51 kg)    GENERAL: Well-nourished well-developed; thin built; Alert, no distress and comfortable.   Accompanied by her mother.  EYES: no pallor or icterus OROPHARYNX: no thrush or ulceration NECK: supple, no masses felt LYMPH:  no palpable lymphadenopathy in the cervical, axillary or inguinal regions LUNGS: clear to auscultation and  No wheeze or crackles HEART/CVS: regular rate & rhythm and no murmurs; No lower extremity edema ABDOMEN: abdomen soft, non-tender and normal bowel sounds; No infections. Musculoskeletal:no cyanosis of digits and no clubbing  PSYCH: alert & oriented x 3 with fluent speech NEURO: no focal motor/sensory deficits SKIN:  no rashes or significant lesions  LABORATORY DATA:  I have reviewed the data as listed Lab Results  Component Value Date   WBC 7.4 06/04/2017   HGB 12.5 06/04/2017   HCT 38.5 06/04/2017   MCV 96.2 06/04/2017   PLT 137 (L) 06/04/2017   Recent Labs    04/16/17 0808 05/07/17 0846 06/04/17 1125  NA 138 135 136  K 3.2* 4.6 3.5  CL 106 108 100*  CO2 24 21* 27  GLUCOSE 116* 96 96  BUN 19 11 15   CREATININE 0.89 0.77 0.82  CALCIUM 8.8* 8.7* 8.9  GFRNONAA >60 >60 >60  GFRAA >60 >60 >60  PROT  6.3* 6.5 6.7  ALBUMIN 3.5 3.4* 3.7  AST 16 17 21   ALT 11* 13* 12*  ALKPHOS 58 65 50  BILITOT 0.4 0.2* 0.4   IMPRESSION: 1. Similar left-sided L2 sclerosis which remains indeterminate but suspicious for an atypical appearance of isolated colon cancer metastasis. 2. Otherwise, no evidence of metastatic disease in the chest, abdomen, or pelvis. 3.  Aortic Atherosclerosis (ICD10-I70.0).   Electronically Signed   By: Abigail Miyamoto M.D.   On: 04/10/2017 13:17  IMPRESSION: Persistent uptake of tracer at the LEFT lateral aspect of the L2 vertebral body, corresponding to an area of sclerosis on CT, cannot exclude metastasis.  No new scintigraphic abnormalities identified.   Electronically Signed   By: Lavonia Dana M.D.   On: 04/20/2017 17:21   ASSESSMENT & PLAN:   .Cancer of ascending colon metastatic to intra-abdominal lymph node ([place]) # Colon cancer- cecal/ right-sided; STAGE IV [intraabdominal/retroperitonel LN/Supracla; K-ras mutated]. Currently on 5FU palliative chemotherapy+ avastin every 3 w. No obvious evidence of clinical progression. Nov 18th 2018- CT-NED; stable- L-2 bone lesion; Dec 2018- Bone scan- persistent uptake in L2 vertebral body area [see discussion below]  #Continue chemo holiday for now; last chemotherapy middle of 2018.  No obvious progression of disease.  CEA around 5; reviewed with the patient.  Monitor for now.  # Thrombocytopenia platelets 140s today/- G-1. Monitor for now.   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  # URI-recommend Claritin+D  # symptomatic L2 bone lesion status post radiation finished May 2018.CT Nov 2018/DEC 2018 Bone scan- STABLE; but symptoms are worse; recommend MRI of thoracic lumbar spine. Discussed with with Dr.Crystal-not too keen on irradiation.  #Metastatic disease to the bone discussed regarding Xgeva- ; patient awaiting dental extraction.  # follow up in 4  weeks/labs/CEA; NO chemo; MRI ASAP.will call re: results of the MRI-possible radiation.      GCammie Sickle MD 06/04/2017 1:02 PM

## 2017-06-05 LAB — CEA: CEA: 5.2 ng/mL — ABNORMAL HIGH (ref 0.0–4.7)

## 2017-06-07 ENCOUNTER — Other Ambulatory Visit: Payer: Self-pay | Admitting: *Deleted

## 2017-06-07 MED ORDER — DULOXETINE HCL 20 MG PO CPEP: 40.0000 mg | ORAL_CAPSULE | Freq: Every day | ORAL | 1 refills | Status: DC

## 2017-06-08 ENCOUNTER — Ambulatory Visit
Admission: RE | Admit: 2017-06-08 | Discharge: 2017-06-08 | Disposition: A | Discharge status: Home / Self Care | Payer: BLUE CROSS/BLUE SHIELD | Source: Ambulatory Visit | Attending: Internal Medicine | Admitting: Internal Medicine

## 2017-06-08 DIAGNOSIS — G8929 Other chronic pain: Secondary | ICD-10-CM

## 2017-06-08 DIAGNOSIS — M4807 Spinal stenosis, lumbosacral region: Secondary | ICD-10-CM | POA: Insufficient documentation

## 2017-06-08 DIAGNOSIS — M48061 Spinal stenosis, lumbar region without neurogenic claudication: Secondary | ICD-10-CM | POA: Insufficient documentation

## 2017-06-08 DIAGNOSIS — M549 Dorsalgia, unspecified: Secondary | ICD-10-CM | POA: Diagnosis not present

## 2017-06-08 DIAGNOSIS — Z923 Personal history of irradiation: Secondary | ICD-10-CM | POA: Insufficient documentation

## 2017-06-08 DIAGNOSIS — C182 Malignant neoplasm of ascending colon: Secondary | ICD-10-CM

## 2017-06-08 DIAGNOSIS — M47814 Spondylosis without myelopathy or radiculopathy, thoracic region: Secondary | ICD-10-CM | POA: Insufficient documentation

## 2017-06-08 DIAGNOSIS — M4316 Spondylolisthesis, lumbar region: Secondary | ICD-10-CM | POA: Diagnosis not present

## 2017-06-08 DIAGNOSIS — C772 Secondary and unspecified malignant neoplasm of intra-abdominal lymph nodes: Secondary | ICD-10-CM | POA: Insufficient documentation

## 2017-06-08 MED ORDER — GADOBENATE DIMEGLUMINE 529 MG/ML IV SOLN
10.0000 mL | Freq: Once | INTRAVENOUS | Status: AC | PRN
  Administered 2017-06-08: 10 mL via INTRAVENOUS

## 2017-07-02 ENCOUNTER — Inpatient Hospital Stay (HOSPITAL_BASED_OUTPATIENT_CLINIC_OR_DEPARTMENT_OTHER): Payer: BLUE CROSS/BLUE SHIELD | Admitting: Internal Medicine

## 2017-07-02 ENCOUNTER — Inpatient Hospital Stay: Payer: BLUE CROSS/BLUE SHIELD | Attending: Internal Medicine

## 2017-07-02 ENCOUNTER — Encounter: Payer: Self-pay | Admitting: Internal Medicine

## 2017-07-02 VITALS — BP 108/64 | Resp 16 | Wt 107.8 lb

## 2017-07-02 DIAGNOSIS — C182 Malignant neoplasm of ascending colon: Secondary | ICD-10-CM | POA: Insufficient documentation

## 2017-07-02 DIAGNOSIS — G620 Drug-induced polyneuropathy: Secondary | ICD-10-CM | POA: Diagnosis not present

## 2017-07-02 DIAGNOSIS — C772 Secondary and unspecified malignant neoplasm of intra-abdominal lymph nodes: Secondary | ICD-10-CM | POA: Diagnosis not present

## 2017-07-02 DIAGNOSIS — C7951 Secondary malignant neoplasm of bone: Secondary | ICD-10-CM | POA: Diagnosis not present

## 2017-07-02 DIAGNOSIS — D696 Thrombocytopenia, unspecified: Secondary | ICD-10-CM

## 2017-07-02 DIAGNOSIS — Z923 Personal history of irradiation: Secondary | ICD-10-CM | POA: Diagnosis not present

## 2017-07-02 LAB — CBC WITH DIFFERENTIAL/PLATELET
Basophils Absolute: 0.1 10*3/uL (ref 0–0.1)
Basophils Relative: 2 %
EOS ABS: 0.1 10*3/uL (ref 0–0.7)
EOS PCT: 2 %
HCT: 40.9 % (ref 35.0–47.0)
HEMOGLOBIN: 13.4 g/dL (ref 12.0–16.0)
LYMPHS ABS: 1 10*3/uL (ref 1.0–3.6)
Lymphocytes Relative: 19 %
MCH: 30.9 pg (ref 26.0–34.0)
MCHC: 32.7 g/dL (ref 32.0–36.0)
MCV: 94.7 fL (ref 80.0–100.0)
MONOS PCT: 10 %
Monocytes Absolute: 0.5 10*3/uL (ref 0.2–0.9)
NEUTROS PCT: 67 %
Neutro Abs: 3.7 10*3/uL (ref 1.4–6.5)
Platelets: 124 10*3/uL — ABNORMAL LOW (ref 150–440)
RBC: 4.32 MIL/uL (ref 3.80–5.20)
RDW: 15.5 % — ABNORMAL HIGH (ref 11.5–14.5)
WBC: 5.5 10*3/uL (ref 3.6–11.0)

## 2017-07-02 LAB — COMPREHENSIVE METABOLIC PANEL
ALBUMIN: 4 g/dL (ref 3.5–5.0)
ALT: 12 U/L — AB (ref 14–54)
AST: 20 U/L (ref 15–41)
Alkaline Phosphatase: 67 U/L (ref 38–126)
Anion gap: 5 (ref 5–15)
BUN: 18 mg/dL (ref 6–20)
CHLORIDE: 102 mmol/L (ref 101–111)
CO2: 27 mmol/L (ref 22–32)
CREATININE: 0.86 mg/dL (ref 0.44–1.00)
Calcium: 8.8 mg/dL — ABNORMAL LOW (ref 8.9–10.3)
GFR calc Af Amer: >60 mL/min (ref >60)
GFR calc non Af Amer: >60 mL/min (ref >60)
Glucose, Bld: 106 mg/dL — ABNORMAL HIGH (ref 65–99)
Potassium: 3.7 mmol/L (ref 3.5–5.1)
SODIUM: 134 mmol/L — AB (ref 135–145)
Total Bilirubin: 0.8 mg/dL (ref 0.3–1.2)
Total Protein: 7.3 g/dL (ref 6.5–8.1)

## 2017-07-02 MED ORDER — HEPARIN SOD (PORK) LOCK FLUSH 100 UNIT/ML IV SOLN
500.0000 [IU] | Freq: Once | INTRAVENOUS | Status: AC
  Administered 2017-07-02: 500 [IU] via INTRAVENOUS

## 2017-07-02 MED ORDER — SODIUM CHLORIDE 0.9% FLUSH
10.0000 mL | INTRAVENOUS | Status: AC | PRN
  Administered 2017-07-02: 10 mL via INTRAVENOUS
  Filled 2017-07-02: qty 10

## 2017-07-02 NOTE — Progress Notes (Signed)
[place] CONSULT NOTE  Patient Care Team: Coral Spikes, DO as PCP - General (Family Medicine)  CHIEF COMPLAINTS/PURPOSE OF CONSULTATION:   Oncology History   # [facility] 2017- COLON CANCER- STAGE IV [s/p right hemi-colectomy; pT4apN2 (7/19LN)]; Pre-op CEA- [id number]-  PET-  mesenteric adenopathy/mediastinal adenopathy/right-sided subclavicular lymph node.    April 17th -START FOLFOX; May 1st Add Avastin;   Aug 16th- Disc [name] [place] PN]; con 5FU-Avastin; NOV 27th CT C/A/P- CR; except for 35m LUL; 5FU-Avastin q 3 W  # March 2018- L2 uptake MET s/p RT [[facility]2018]; mid April X-geva   # MOLECULAR TESTING: K-ras-EXON 2- MUTATED; MSI- STABLE.      Cancer of ascending colon metastatic to intra-abdominal lymph node ([place]   09/06/2015 Initial Diagnosis    Cancer of ascending colon metastatic to intra-abdominal lymph node (HCC)        HISTORY OF PRESENTING ILLNESS:  Nancy PRESUTTI89y.o.  female  with a diagnosed Metastatic right-sided colon cancer status post 5-FU plus Avastin-currently on chemo holiday is here for follow-up/reviewed the results of her thoracic and lumbar spine MRI.  Patient continues to have mild back pain for which she is taking hydrocodone up to 2 pills a day.  This is fairly stable not any worse.  Denies any bleeding.  Energy levels improved.  Denies any headaches.  Denies any nausea vomiting.  Appetite is fair.  Patient had multiple teeth extracted; poor p.o. intake lost some weight.  Otherwise denies any loss of appetite.  ROS: A complete 10 point review of system is done which is negative except mentioned above in history of present illness  MEDICAL HISTORY:  Past Medical History:  Diagnosis Date  . Chicken pox   . Colon cancer ([facility]    Partial colectomy 07/2015 + chemo tx's  . Hypertension   . Hypokalemia   . Menopause    > 5 yrs  . Peripheral neuropathy due to chemotherapy ([facility]     SURGICAL HISTORY: Past Surgical History:   Procedure Laterality Date  . BREAST BIOPSY Right    2007? pt unsure done by [facility]  . COLONOSCOPY    . COLONOSCOPY WITH PROPOFOL N/A 10/14/2015   Procedure: COLONOSCOPY WITH PROPOFOL;  Surgeon: PHulen Luster MD;  [facility];  Service: Endoscopy;  Laterality: N/A;  . COLOSTOMY REVISION Right 08/09/2015   Procedure: COLON RESECTION RIGHT;  Surgeon: RFlorene Glen MD;  Location: [facility] ORS;  Service: General;  Laterality: Right;  . ECTOPIC PREGNANCY SURGERY    . PORTACATH PLACEMENT N/A 09/01/2015   Procedure: INSERTION PORT-A-CATH;  Surgeon: RFlorene Glen MD;  Location: [facility] ORS;  Service: General;  Laterality: N/A;  . SHOULDER SURGERY Left     [place]  1 pack in 3 days; no alcohol. 2 childern [25 & 17 years] Social History   Socioeconomic History  . Marital status: Married    Spouse name: Not on file  . Number of children: Not on file  . Years of education: Not on file  . Highest education level: Not on file  Social Needs  . Financial resource strain: Not on file  . Food insecurity - worry: Not on file  . Food insecurity - inability: Not on file  . Transportation needs - medical: Not on file  . Transportation needs - non-medical: Not on file  Occupational History  . Not on file  Tobacco Use  . Smoking status: Former Smoker  Packs/day: 0.25    Years: 2.00    Pack years: 0.50  . Smokeless tobacco: Never Used  . Tobacco comment: recently quit  Substance and Sexual Activity  . Alcohol use: No    Alcohol/week: 0.0 oz  . Drug use: No  . Sexual activity: Yes  Other Topics Concern  . Not on file  Social History Narrative  . Not on file    FAMILY HISTORY: 1 brother& 1 sister- no cancer. Mom-no cancer Family History  Problem Relation Age of Onset  . Hypertension Mother   . Arthritis Father   . Prostate cancer Father   . Diabetes Maternal Grandmother   . Colon cancer Maternal Grandfather        colon     ALLERGIES:  has No Known Allergies.  MEDICATIONS:  Current Outpatient Medications  Medication Sig Dispense Refill  . acetaminophen (TYLENOL) 325 MG tablet Take 2 tablets (650 mg total) by mouth every 6 (six) hours as needed for mild pain (or Fever >/= 101).    . benazepril (LOTENSIN) 20 MG tablet Take 1 tablet (20 mg total) by mouth daily. 30 tablet 6  . benazepril-hydrochlorthiazide (LOTENSIN HCT) 20-12.5 MG tablet Take 1 tablet by mouth daily. 60 tablet 3  . diphenoxylate-atropine (LOMOTIL) 2.5-0.025 MG tablet Take 1 tablet by mouth 4 (four) times daily as needed for diarrhea or loose stools. Take it along with immodium 40 tablet 4  . DULoxetine (CYMBALTA) 20 MG capsule Take 2 capsules (40 mg total) by mouth daily. 90 capsule 1  . feeding supplement (BOOST HIGH PROTEIN) LIQD Take 1 Container by mouth daily.    . ferrous sulfate (FERROUSUL) 325 (65 FE) MG tablet Take 1 tablet (325 mg total) by mouth daily with breakfast. 90 tablet 3  . gabapentin (NEURONTIN) 300 MG capsule One pill in am; and 2 pills at night prior to sleep. 90 capsule 3  . hydrochlorothiazide (MICROZIDE) 12.5 MG capsule Take 1 capsule (12.5 mg total) by mouth daily. 30 capsule 6  . HYDROcodone-acetaminophen (NORCO/VICODIN) 5-325 MG tablet Take 1 tablet by mouth every 8 (eight) hours as needed for moderate pain. 90 tablet 0  . lidocaine-prilocaine (EMLA) cream Apply 1 application topically as needed. Apply generously over the Mediport 45 minutes prior to chemotherapy. 30 g 6  . loperamide (IMODIUM) 2 MG capsule Take 2 mg by mouth as needed for diarrhea or loose stools.    . Multiple Vitamins-Minerals (MULTIVITAMIN ADULT PO) Take 1 tablet by mouth daily.    . ondansetron (ZOFRAN) 8 MG tablet Take 1 tablet (8 mg total) by mouth every 8 (eight) hours as needed for nausea or vomiting (start 3 days; after chemo). 40 tablet 0  . potassium chloride (K-DUR) 10 MEQ tablet Take 6 tablets (60 mEq total) by mouth daily. 540 tablet 0   . prochlorperazine (COMPAZINE) 10 MG tablet Take 1 tablet (10 mg total) by mouth every 6 (six) hours as needed for nausea or vomiting. 90 tablet 1   No current facility-administered medications for this visit.    Facility-Administered Medications Ordered in Other Visits  Medication Dose Route Frequency Provider Last Rate Last Dose  . sodium chloride flush (NS) 0.9 % injection 10 mL  10 mL Intravenous PRN Cammie Sickle, MD   10 mL at 10/04/15 0901  . sodium chloride flush (NS) 0.9 % injection 10 mL  10 mL Intravenous PRN Cammie Sickle, MD   10 mL at 07/02/17 1020      PHYSICAL EXAMINATION: ECOG  PERFORMANCE STATUS: 0 - Asymptomatic  Vitals:   07/02/17 1057  BP: 108/64  Resp: 16   Filed Weights   07/02/17 1057 07/02/17 1103  Weight: 107 lb 12.8 oz (48.9 kg) 107 lb 12.8 oz (48.9 kg)    GENERAL: Well-nourished well-developed; thin built; Alert, no distress and comfortable.   Accompanied by her mother.  EYES: no pallor or icterus OROPHARYNX: no thrush or ulceration NECK: supple, no masses felt LYMPH:  no palpable lymphadenopathy in the cervical, axillary or inguinal regions LUNGS: clear to auscultation and  No wheeze or crackles HEART/CVS: regular rate & rhythm and no murmurs; No lower extremity edema ABDOMEN: abdomen soft, non-tender and normal bowel sounds; No infections. Musculoskeletal:no cyanosis of digits and no clubbing  PSYCH: alert & oriented x 3 with fluent speech NEURO: no focal motor/sensory deficits SKIN:  no rashes or significant lesions  LABORATORY DATA:  I have reviewed the data as listed Lab Results  Component Value Date   WBC 5.5 07/02/2017   HGB 13.4 07/02/2017   HCT 40.9 07/02/2017   MCV 94.7 07/02/2017   PLT 124 (L) 07/02/2017   Recent Labs    05/07/17 0846 06/04/17 1125 07/02/17 1020  NA 135 136 134*  K 4.6 3.5 3.7  CL 108 100* 102  CO2 21* 27 27  GLUCOSE 96 96 106*  BUN _0 CREATININE 0.77 0.82 0.86  CALCIUM 8.7* 8.9  8.8*  GFRNONAA >60 >60 >60  GFRAA >60 >60 >60  PROT 6.5 6.7 7.3  ALBUMIN 3.4* 3.7 4.0  AST _1 ALT 13* 12* 12*  ALKPHOS 65 50 67  BILITOT 0.2* 0.4 0.8   IMPRESSION: 1. Similar left-sided L2 sclerosis which remains indeterminate but suspicious for an atypical appearance of isolated colon cancer metastasis. 2. Otherwise, no evidence of metastatic disease in the chest, abdomen, or pelvis. 3.  Aortic Atherosclerosis (ICD10-I70.0).   Electronically Signed   By: Abigail Miyamoto M.D.   On: 04/10/2017 13:17  IMPRESSION: Persistent uptake of tracer at the LEFT lateral aspect of the L2 vertebral body, corresponding to an area of sclerosis on CT, cannot exclude metastasis.  No new scintigraphic abnormalities identified.   Electronically Signed   By: Lavonia Dana M.D.   On: 04/20/2017 17:21  -----------------------------------------------------     IMPRESSION: MRI THORACIC SPINE:  1. No evidence of metastasis or acute process. 2. Degenerative change of the thoracic spine without canal stenosis or neural foraminal narrowing.  MRI LUMBAR SPINE:  1. Non mass-like and signal and enhancement L2 vertebral body corresponding to CT abnormality. Given surrounding post radiation changes, this favors radiation osteonecrosis, less likely solitary metastasis. 2. Minimal grade 1 L5-S1 anterolisthesis on degenerative basis. 3. No canal stenosis. Minimal to mild L3-4 through L5-S1 neural foraminal narrowing.   Electronically Signed   By: Elon Alas M.D.   On: 06/08/2017 20:53   ASSESSMENT & PLAN:   .Cancer of ascending colon metastatic to intra-abdominal lymph node ([place]) # Colon cancer- cecal/ right-sided; STAGE IV [intraabdominal/retroperitonel LN/Supracla; K-ras mutated].  5-FU Avastin chemotherapy; currently on hold/chemo holiday.  CT scan April 08, 2017-NED; except for L2 bone lesion [see discussion below].  #Continue to hold the 5-FU Avastin  chemotherapy/chemo holiday.  Will reassess for the scan in approximately a month prior to next visit.  # Thrombocytopenia platelets 140s today/- G-1. Monitor for now.   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 symptomatic L2 bone  lesion status post radiation finished May 2018 MRI of thoracic lumbar spine- ? L2 radiation necrosis; hold of radiation.   #Weight loss secondary to tooth extraction.  # follow up in 1 month/CEA; labs. CT a/p few days prior     Cammie Sickle, MD 07/03/2017 8:24 AM

## 2017-07-02 NOTE — Assessment & Plan Note (Addendum)
#  Colon cancer- cecal/ right-sided; STAGE IV [intraabdominal/retroperitonel LN/Supracla; K-ras mutated].  5-FU Avastin chemotherapy; currently on hold/chemo holiday.  CT scan April 08, 2017-NED; except for L2 bone lesion [see discussion below].  #Continue to hold the 5-FU Avastin chemotherapy/chemo holiday.  Will reassess for the scan in approximately a month prior to next visit.  # Thrombocytopenia platelets 140s today/- G-1. Monitor for now.   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 symptomatic L2 bone lesion status post radiation finished May 2018 MRI of thoracic lumbar spine- ? L2 radiation necrosis; hold of radiation.   #Weight loss secondary to tooth extraction.  # follow up in 1 month/CEA; labs. CT a/p few days prior

## 2017-07-03 LAB — CEA: CEA: 4.8 ng/mL — ABNORMAL HIGH (ref 0.0–4.7)

## 2017-07-26 ENCOUNTER — Ambulatory Visit
Admission: RE | Admit: 2017-07-26 | Discharge: 2017-07-26 | Disposition: A | Discharge status: Home / Self Care | Payer: BLUE CROSS/BLUE SHIELD | Source: Ambulatory Visit | Attending: Internal Medicine | Admitting: Internal Medicine

## 2017-07-26 DIAGNOSIS — G9589 Other specified diseases of spinal cord: Secondary | ICD-10-CM | POA: Insufficient documentation

## 2017-07-26 DIAGNOSIS — C772 Secondary and unspecified malignant neoplasm of intra-abdominal lymph nodes: Secondary | ICD-10-CM | POA: Insufficient documentation

## 2017-07-26 DIAGNOSIS — C182 Malignant neoplasm of ascending colon: Secondary | ICD-10-CM | POA: Insufficient documentation

## 2017-07-26 MED ORDER — IOPAMIDOL (ISOVUE-300) INJECTION 61%
75.0000 mL | Freq: Once | INTRAVENOUS | Status: AC | PRN
  Administered 2017-07-26: 75 mL via INTRAVENOUS

## 2017-07-30 ENCOUNTER — Inpatient Hospital Stay: Payer: BLUE CROSS/BLUE SHIELD | Attending: Internal Medicine

## 2017-07-30 ENCOUNTER — Inpatient Hospital Stay (HOSPITAL_BASED_OUTPATIENT_CLINIC_OR_DEPARTMENT_OTHER): Payer: BLUE CROSS/BLUE SHIELD | Admitting: Internal Medicine

## 2017-07-30 ENCOUNTER — Other Ambulatory Visit: Payer: Self-pay

## 2017-07-30 ENCOUNTER — Encounter: Payer: Self-pay | Admitting: Internal Medicine

## 2017-07-30 VITALS — BP 113/75 | HR 76 | Temp 98.0°F | Resp 12 | Ht 60.0 in | Wt 111.2 lb

## 2017-07-30 DIAGNOSIS — Z923 Personal history of irradiation: Secondary | ICD-10-CM | POA: Diagnosis not present

## 2017-07-30 DIAGNOSIS — C7951 Secondary malignant neoplasm of bone: Secondary | ICD-10-CM | POA: Insufficient documentation

## 2017-07-30 DIAGNOSIS — D696 Thrombocytopenia, unspecified: Secondary | ICD-10-CM

## 2017-07-30 DIAGNOSIS — C772 Secondary and unspecified malignant neoplasm of intra-abdominal lymph nodes: Secondary | ICD-10-CM

## 2017-07-30 DIAGNOSIS — C182 Malignant neoplasm of ascending colon: Secondary | ICD-10-CM

## 2017-07-30 LAB — COMPREHENSIVE METABOLIC PANEL
ALT: 16 U/L (ref 14–54)
AST: 21 U/L (ref 15–41)
Albumin: 4.1 g/dL (ref 3.5–5.0)
Alkaline Phosphatase: 78 U/L (ref 38–126)
Anion gap: 9 (ref 5–15)
BUN: 11 mg/dL (ref 6–20)
CHLORIDE: 104 mmol/L (ref 101–111)
CO2: 24 mmol/L (ref 22–32)
CREATININE: 0.85 mg/dL (ref 0.44–1.00)
Calcium: 9.2 mg/dL (ref 8.9–10.3)
GFR calc Af Amer: >60 mL/min (ref >60)
GLUCOSE: 98 mg/dL (ref 65–99)
POTASSIUM: 3.9 mmol/L (ref 3.5–5.1)
Sodium: 137 mmol/L (ref 135–145)
Total Bilirubin: 0.3 mg/dL (ref 0.3–1.2)
Total Protein: 7.2 g/dL (ref 6.5–8.1)

## 2017-07-30 LAB — CBC WITH DIFFERENTIAL/PLATELET
Basophils Absolute: 0 10*3/uL (ref 0–0.1)
Basophils Relative: 1 %
EOS PCT: 3 %
Eosinophils Absolute: 0.1 10*3/uL (ref 0–0.7)
HCT: 40.2 % (ref 35.0–47.0)
Hemoglobin: 13.1 g/dL (ref 12.0–16.0)
LYMPHS PCT: 20 %
Lymphs Abs: 1 10*3/uL (ref 1.0–3.6)
MCH: 31 pg (ref 26.0–34.0)
MCHC: 32.7 g/dL (ref 32.0–36.0)
MCV: 94.9 fL (ref 80.0–100.0)
MONO ABS: 0.5 10*3/uL (ref 0.2–0.9)
Monocytes Relative: 10 %
NEUTROS ABS: 3.4 10*3/uL (ref 1.4–6.5)
Neutrophils Relative %: 66 %
PLATELETS: 144 10*3/uL — AB (ref 150–440)
RBC: 4.23 MIL/uL (ref 3.80–5.20)
RDW: 15.8 % — AB (ref 11.5–14.5)
WBC: 5.1 10*3/uL (ref 3.6–11.0)

## 2017-07-30 MED ORDER — HYDROCODONE-ACETAMINOPHEN 5-325 MG PO TABS: 1.0000 | ORAL_TABLET | Freq: Two times a day (BID) | ORAL | 0 refills | Status: DC | PRN

## 2017-07-30 NOTE — Assessment & Plan Note (Addendum)
#  Colon cancer- cecal/ right-sided; STAGE IV [intraabdominal/retroperitonel LN/Supracla; K-ras mutated].  5-FU Avastin chemotherapy; currently on hold/chemo holiday.  CT scan March 2019-NED; except for L2 bone lesion- sclerosis [see discussion below].  # Continue to hold the 5-FU Avastin chemotherapy/chemo holiday.    # Thrombocytopenia platelets 144s today/- G-1. Monitor for now.   # PN- G-1-2-sec to prior oxaliplatin; Neurontin 300- 600 mg BID;on Cymbalta 26m daily  # ? symptomatic L2 bone lesion status post radiation finished May 2018 MRI of thoracic lumbar spine- ? L2 radiation necrosis; hold off radiation. Continue hydrocodone 1 every 12 hours; and NSAIDs as needed.  New script given for hydrocodone.   # follow up in 6 weeks/lab- CEA/ port flush; will order scan at that time.   # I reviewed the blood work- with the patient in detail; also reviewed the imaging independently [as summarized above]; and with the patient in detail.

## 2017-07-30 NOTE — Progress Notes (Signed)
Patient here for follow up. No concerns today she has gained 4 pounds since last visit. She needs refill for her pain medicaiton.

## 2017-07-30 NOTE — Progress Notes (Signed)
[facility] CONSULT NOTE  Patient Care Team: Cook, Jayce G, DO as PCP - General (Family Medicine)  CHIEF COMPLAINTS/PURPOSE OF CONSULTATION:   Oncology History   # FEB-MARCH 2017- COLON CANCER- STAGE IV [s/p right hemi-colectomy; pT4apN2 (7/19LN)]; Pre-op CEA- 7.April2017-  PET-  mesenteric adenopathy/mediastinal adenopathy/right-sided subclavicular lymph node.    April 17th -START FOLFOX; May 1st Add Avastin;   Aug 16th- Disc OX [sec PN]; con 5FU-Avastin; NOV 27th CT C/A/P- CR; except for 4mm LUL; 5FU-Avastin q 3 W  # March 2018- L2 uptake MET s/p RT [May 2018]; mid April X-geva   # MOLECULAR TESTING: K-ras-EXON 2- MUTATED; MSI- STABLE.      Cancer of ascending colon metastatic to intra-abdominal lymph node (HCC)   09/06/2015 Initial Diagnosis    Cancer of ascending colon metastatic to intra-abdominal lymph node (HCC)        HISTORY OF PRESENTING ILLNESS:  Nancy Clark 56 y.o.  female  with a diagnosed Metastatic right-sided colon cancer status post 5-FU plus Avastin-currently on chemo holiday is here for follow-up/  review the results of her restaging CAT scan.  Patient continues to have mild-moderate back pain for which she is taking hydrocodone up to 2 pills a day.  This is not getting any worse.    Denies any bleeding.  Energy levels improved.  Denies any headaches.  Denies any nausea vomiting.  Appetite is fair.  No abdominal pain.  No constipation.  Gaining weight.  ROS: A complete 10 point review of system is done which is negative except mentioned above in history of present illness  MEDICAL HISTORY:  Past Medical History:  Diagnosis Date  . Chicken pox   . Colon cancer (HCC)    Partial colectomy 07/2015 + chemo tx's  . Hypertension   . Hypokalemia   . Menopause    > 5 yrs  . Peripheral neuropathy due to chemotherapy (HCC)     SURGICAL HISTORY: Past Surgical History:  Procedure Laterality Date  . BREAST BIOPSY Right    2007? pt unsure  done by [facility]  . COLONOSCOPY    . COLONOSCOPY WITH PROPOFOL N/A 10/14/2015   Procedure: COLONOSCOPY WITH PROPOFOL;  Surgeon: Paul Y Oh, MD;  Location: [facility] ENDOSCOPY;  Service: Endoscopy;  Laterality: N/A;  . COLOSTOMY REVISION Right 08/09/2015   Procedure: COLON RESECTION RIGHT;  Surgeon: Richard E Cooper, MD;  Location: [facility] ORS;  Service: General;  Laterality: Right;  . ECTOPIC PREGNANCY SURGERY    . PORTACATH PLACEMENT N/A 09/01/2015   Procedure: INSERTION PORT-A-CATH;  Surgeon: Richard E Cooper, MD;  Location: [facility] ORS;  Service: General;  Laterality: N/A;  . 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  1 pack in 3 days; no alcohol. 2 childern [25 & 17 years] Social History   Socioeconomic History  . Marital status: Married    Spouse name: Not on file  . Number of children: Not on file  . Years of education: Not on file  . Highest education level: Not on file  Social Needs  . Financial resource strain: Not on file  . Food insecurity - worry: Not on file  . Food insecurity - inability: Not on file  . Transportation needs - medical: Not on file  . Transportation needs - non-medical: Not on file  Occupational History  . Not on file  Tobacco Use  . Smoking status: Former Smoker    Packs/day: 0.25    Years:   2.00    Pack years: 0.50  . Smokeless tobacco: Never Used  . Tobacco comment: recently quit  Substance and Sexual Activity  . Alcohol use: No    Alcohol/week: 0.0 oz  . Drug use: No  . Sexual activity: Yes  Other Topics Concern  . Not on file  Social History Narrative  . Not on file    FAMILY HISTORY: 1 brother& 1 sister- no cancer. Mom-no cancer Family History  Problem Relation Age of Onset  . Hypertension Mother   . Arthritis Father   . Prostate cancer Father   . Diabetes Maternal Grandmother   . Colon cancer Maternal Grandfather        colon    ALLERGIES:  has No Known Allergies.  MEDICATIONS:  Current Outpatient  Medications  Medication Sig Dispense Refill  . acetaminophen (TYLENOL) 325 MG tablet Take 2 tablets (650 mg total) by mouth every 6 (six) hours as needed for mild pain (or Fever >/= 101).    . benazepril (LOTENSIN) 20 MG tablet Take 1 tablet (20 mg total) by mouth daily. 30 tablet 6  . benazepril-hydrochlorthiazide (LOTENSIN HCT) 20-12.5 MG tablet Take 1 tablet by mouth daily. 60 tablet 3  . diphenoxylate-atropine (LOMOTIL) 2.5-0.025 MG tablet Take 1 tablet by mouth 4 (four) times daily as needed for diarrhea or loose stools. Take it along with immodium 40 tablet 4  . DULoxetine (CYMBALTA) 20 MG capsule Take 2 capsules (40 mg total) by mouth daily. 90 capsule 1  . feeding supplement (BOOST HIGH PROTEIN) LIQD Take 1 Container by mouth daily.    . ferrous sulfate (FERROUSUL) 325 (65 FE) MG tablet Take 1 tablet (325 mg total) by mouth daily with breakfast. 90 tablet 3  . gabapentin (NEURONTIN) 300 MG capsule One pill in am; and 2 pills at night prior to sleep. 90 capsule 3  . hydrochlorothiazide (MICROZIDE) 12.5 MG capsule Take 1 capsule (12.5 mg total) by mouth daily. 30 capsule 6  . HYDROcodone-acetaminophen (NORCO/VICODIN) 5-325 MG tablet Take 1 tablet by mouth every 12 (twelve) hours as needed for moderate pain. 60 tablet 0  . lidocaine-prilocaine (EMLA) cream Apply 1 application topically as needed. Apply generously over the Mediport 45 minutes prior to chemotherapy. 30 g 6  . loperamide (IMODIUM) 2 MG capsule Take 2 mg by mouth as needed for diarrhea or loose stools.    . Multiple Vitamins-Minerals (MULTIVITAMIN ADULT PO) Take 1 tablet by mouth daily.    . ondansetron (ZOFRAN) 8 MG tablet Take 1 tablet (8 mg total) by mouth every 8 (eight) hours as needed for nausea or vomiting (start 3 days; after chemo). 40 tablet 0  . potassium chloride (K-DUR) 10 MEQ tablet Take 6 tablets (60 mEq total) by mouth daily. 540 tablet 0  . prochlorperazine (COMPAZINE) 10 MG tablet Take 1 tablet (10 mg total) by  mouth every 6 (six) hours as needed for nausea or vomiting. 90 tablet 1   No current facility-administered medications for this visit.    Facility-Administered Medications Ordered in Other Visits  Medication Dose Route Frequency Provider Last Rate Last Dose  . sodium chloride flush (NS) 0.9 % injection 10 mL  10 mL Intravenous PRN Brahmanday, Govinda R, MD   10 mL at 10/04/15 0901  . sodium chloride flush (NS) 0.9 % injection 10 mL  10 mL Intravenous PRN Brahmanday, Govinda R, MD   10 mL at 07/02/17 1020      PHYSICAL EXAMINATION: ECOG PERFORMANCE STATUS: 0 - Asymptomatic    Vitals:   07/30/17 1029 07/30/17 1033  BP:  113/75  Pulse:  76  Resp: 12   Temp:  98 F (36.7 C)   Filed Weights   07/30/17 1029  Weight: 111 lb 3.2 oz (50.4 kg)    GENERAL: Well-nourished well-developed; thin built; Alert, no distress and comfortable.   Accompanied by her mother.  EYES: no pallor or icterus OROPHARYNX: no thrush or ulceration NECK: supple, no masses felt LYMPH:  no palpable lymphadenopathy in the cervical, axillary or inguinal regions LUNGS: clear to auscultation and  No wheeze or crackles HEART/CVS: regular rate & rhythm and no murmurs; No lower extremity edema ABDOMEN: abdomen soft, non-tender and normal bowel sounds; No infections. Musculoskeletal:no cyanosis of digits and no clubbing  PSYCH: alert & oriented x 3 with fluent speech NEURO: no focal motor/sensory deficits SKIN:  no rashes or significant lesions  LABORATORY DATA:  I have reviewed the data as listed Lab Results  Component Value Date   WBC 5.1 07/30/2017   HGB 13.1 07/30/2017   HCT 40.2 07/30/2017   MCV 94.9 07/30/2017   PLT 144 (L) 07/30/2017   Recent Labs    06/04/17 1125 07/02/17 1020 07/30/17 1020  NA 136 134* 137  K 3.5 3.7 3.9  CL 100* 102 104  CO2 27 27 24  GLUCOSE 96 106* 98  BUN 15 18 11  CREATININE 0.82 0.86 0.85  CALCIUM 8.9 8.8* 9.2  GFRNONAA >60 >60 >60  GFRAA >60 >60 >60  PROT 6.7 7.3  7.2  ALBUMIN 3.7 4.0 4.1  AST 21 20 21  ALT 12* 12* 16  ALKPHOS 50 67 78  BILITOT 0.4 0.8 0.3   IMPRESSION: 1. Similar left-sided L2 sclerosis which remains indeterminate but suspicious for an atypical appearance of isolated colon cancer metastasis. 2. Otherwise, no evidence of metastatic disease in the chest, abdomen, or pelvis. 3.  Aortic Atherosclerosis (ICD10-I70.0).   Electronically Signed   By: Kyle  Talbot M.D.   On: 04/10/2017 13:17  IMPRESSION: Persistent uptake of tracer at the LEFT lateral aspect of the L2 vertebral body, corresponding to an area of sclerosis on CT, cannot exclude metastasis.  No new scintigraphic abnormalities identified.   Electronically Signed   By: Mark  Boles M.D.   On: 04/20/2017 17:21  -----------------------------------------------------     IMPRESSION: MRI THORACIC SPINE:  1. No evidence of metastasis or acute process. 2. Degenerative change of the thoracic spine without canal stenosis or neural foraminal narrowing.  MRI LUMBAR SPINE:  1. Non mass-like and signal and enhancement L2 vertebral body corresponding to CT abnormality. Given surrounding post radiation changes, this favors radiation osteonecrosis, less likely solitary metastasis. 2. Minimal grade 1 L5-S1 anterolisthesis on degenerative basis. 3. No canal stenosis. Minimal to mild L3-4 through L5-S1 neural foraminal narrowing.   Electronically Signed   By: Courtnay  Bloomer M.D.   On: 06/08/2017 20:53  -------------------------------------------------------------------------     IMPRESSION: 1. Similar left-sided L2 sclerosis. Recent MRI from 06/08/2017 favors sequelae of external beam radiation. 2. No evidence for solid organ or nodal metastasis within the abdomen or pelvis.   Electronically Signed   By: Taylor  Stroud M.D.   On: 07/26/2017 13:50   ASSESSMENT & PLAN:   .Cancer of ascending colon metastatic to intra-abdominal lymph node  (HCC) # Colon cancer- cecal/ right-sided; STAGE IV [intraabdominal/retroperitonel LN/Supracla; K-ras mutated].  5-FU Avastin chemotherapy; currently on hold/chemo holiday.  CT scan March 2019-NED; except for L2 bone lesion- sclerosis [see discussion below].  #   Continue to hold the 5-FU Avastin chemotherapy/chemo holiday.    # Thrombocytopenia platelets 144s today/- G-1. Monitor for now.   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 symptomatic L2 bone lesion status post radiation finished May 2018 MRI of thoracic lumbar spine- ? L2 radiation necrosis; hold off radiation. Continue hydrocodone 1 every 12 hours; and NSAIDs as needed.  New script given for hydrocodone.   # follow up in 6 weeks/lab- CEA/ port flush; will order scan at that time.   # I reviewed the blood work- with the patient in detail; also reviewed the imaging independently [as summarized above]; and with the patient in detail.      GCammie Sickle MD 07/30/2017 11:52 AM

## 2017-09-10 ENCOUNTER — Other Ambulatory Visit: Payer: Self-pay

## 2017-09-10 ENCOUNTER — Inpatient Hospital Stay (HOSPITAL_BASED_OUTPATIENT_CLINIC_OR_DEPARTMENT_OTHER): Payer: BLUE CROSS/BLUE SHIELD | Admitting: Internal Medicine

## 2017-09-10 ENCOUNTER — Encounter: Payer: Self-pay | Admitting: Internal Medicine

## 2017-09-10 ENCOUNTER — Inpatient Hospital Stay: Payer: BLUE CROSS/BLUE SHIELD | Attending: Internal Medicine

## 2017-09-10 VITALS — BP 115/78 | HR 75 | Temp 97.9°F | Resp 18 | Ht 60.0 in | Wt 108.0 lb

## 2017-09-10 DIAGNOSIS — I700 Atherosclerosis of aorta: Secondary | ICD-10-CM | POA: Insufficient documentation

## 2017-09-10 DIAGNOSIS — C772 Secondary and unspecified malignant neoplasm of intra-abdominal lymph nodes: Secondary | ICD-10-CM

## 2017-09-10 DIAGNOSIS — I10 Essential (primary) hypertension: Secondary | ICD-10-CM | POA: Insufficient documentation

## 2017-09-10 DIAGNOSIS — C182 Malignant neoplasm of ascending colon: Secondary | ICD-10-CM | POA: Insufficient documentation

## 2017-09-10 DIAGNOSIS — Z79899 Other long term (current) drug therapy: Secondary | ICD-10-CM | POA: Diagnosis not present

## 2017-09-10 DIAGNOSIS — G620 Drug-induced polyneuropathy: Secondary | ICD-10-CM | POA: Diagnosis not present

## 2017-09-10 DIAGNOSIS — Z87891 Personal history of nicotine dependence: Secondary | ICD-10-CM

## 2017-09-10 DIAGNOSIS — Z95828 Presence of other vascular implants and grafts: Secondary | ICD-10-CM

## 2017-09-10 LAB — COMPREHENSIVE METABOLIC PANEL
ALK PHOS: 77 U/L (ref 38–126)
ALT: 9 U/L — AB (ref 14–54)
AST: 15 U/L (ref 15–41)
Albumin: 3.7 g/dL (ref 3.5–5.0)
Anion gap: 9 (ref 5–15)
BUN: 14 mg/dL (ref 6–20)
CHLORIDE: 103 mmol/L (ref 101–111)
CO2: 26 mmol/L (ref 22–32)
CREATININE: 0.82 mg/dL (ref 0.44–1.00)
Calcium: 9.1 mg/dL (ref 8.9–10.3)
GFR calc Af Amer: >60 mL/min (ref >60)
GFR calc non Af Amer: >60 mL/min (ref >60)
GLUCOSE: 98 mg/dL (ref 65–99)
Potassium: 3.4 mmol/L — ABNORMAL LOW (ref 3.5–5.1)
Sodium: 138 mmol/L (ref 135–145)
Total Bilirubin: 0.5 mg/dL (ref 0.3–1.2)
Total Protein: 6.7 g/dL (ref 6.5–8.1)

## 2017-09-10 LAB — CBC WITH DIFFERENTIAL/PLATELET
Basophils Absolute: 0.1 10*3/uL (ref 0–0.1)
Basophils Relative: 1 %
EOS ABS: 0.1 10*3/uL (ref 0–0.7)
Eosinophils Relative: 2 %
HCT: 36.5 % (ref 35.0–47.0)
HEMOGLOBIN: 12.4 g/dL (ref 12.0–16.0)
LYMPHS ABS: 1 10*3/uL (ref 1.0–3.6)
Lymphocytes Relative: 17 %
MCH: 31 pg (ref 26.0–34.0)
MCHC: 34.1 g/dL (ref 32.0–36.0)
MCV: 91 fL (ref 80.0–100.0)
MONO ABS: 0.6 10*3/uL (ref 0.2–0.9)
MONOS PCT: 9 %
Neutro Abs: 4.2 10*3/uL (ref 1.4–6.5)
Neutrophils Relative %: 71 %
Platelets: 136 10*3/uL — ABNORMAL LOW (ref 150–440)
RBC: 4.01 MIL/uL (ref 3.80–5.20)
RDW: 15.4 % — ABNORMAL HIGH (ref 11.5–14.5)
WBC: 5.9 10*3/uL (ref 3.6–11.0)

## 2017-09-10 MED ORDER — HEPARIN SOD (PORK) LOCK FLUSH 100 UNIT/ML IV SOLN
500.0000 [IU] | INTRAVENOUS | Status: AC | PRN
  Administered 2017-09-10: 500 [IU]

## 2017-09-10 MED ORDER — SODIUM CHLORIDE 0.9% FLUSH
10.0000 mL | INTRAVENOUS | Status: AC | PRN
  Administered 2017-09-10: 10 mL
  Filled 2017-09-10: qty 10

## 2017-09-10 NOTE — Assessment & Plan Note (Addendum)
#  Colon cancer- cecal/ right-sided; STAGE IV [intraabdominal/retroperitonel LN/Supracla; K-ras mutated].  5-FU Avastin chemotherapy; currently on hold/chemo holiday.  CT scan March 2019-NED; except for L2 bone lesion- sclerosis [see discussion below].  # Continue to hold the 5-FU Avastin chemotherapy/chemo holiday; last CEA feb 4.8/ CT ordered today.   # Thrombocytopenia platelets 130s today/- G-1. Monitor for now.   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 symptomatic L2 bone lesion status post radiation finished May 2018 MRI of thoracic lumbar spine- ? L2 radiation necrosis; hold off radiation.   # follow up in 8 weeks/lab- CEA/ port flush; CT ca/p scan prior.  

## 2017-09-10 NOTE — Progress Notes (Signed)
Patient here for follow-up for colon cancer. Patient has no medical complaints today

## 2017-09-10 NOTE — Progress Notes (Signed)
[place] CONSULT NOTE  Patient Care Team: Coral Spikes, DO as PCP - General (Family Medicine)  CHIEF COMPLAINTS/PURPOSE OF CONSULTATION:   Oncology History   # [facility] 2017- COLON CANCER- STAGE IV [s/p right hemi-colectomy; pT4apN2 (7/19LN)]; Pre-op CEA- [id number]-  PET-  mesenteric adenopathy/mediastinal adenopathy/right-sided subclavicular lymph node.    April 17th -START FOLFOX; May 1st Add Avastin;   Aug 16th- Disc [name] [place] PN]; con 5FU-Avastin; NOV 27th CT C/A/P- CR; except for 13m LUL; 5FU-Avastin q 3 W  # March 2018- L2 uptake MET s/p RT [[facility]2018]; mid April X-geva   # MOLECULAR TESTING: K-ras-EXON 2- MUTATED; MSI- STABLE.      Cancer of ascending colon metastatic to intra-abdominal lymph node ([facility]   09/06/2015 Initial Diagnosis    Cancer of ascending colon metastatic to intra-abdominal lymph node (HCC)        HISTORY OF PRESENTING ILLNESS:  Nancy MENON53y.o.  female  with a diagnosed Metastatic right-sided colon cancer status post 5-FU plus Avastin-currently on chemo holiday is here for follow-up.  Patient denies any worsening back pain.  She continues to take hydrocodone as needed.  Denies any blood in stools or black colored stools.   Denies any bleeding.  Energy levels improved.  Denies any headaches.  Denies any nausea vomiting.  Appetite is fair.  No abdominal pain.  No constipation.  Gaining weight.  She feels better off chemotherapy at this time.  ROS: A complete 10 point review of system is done which is negative except mentioned above in history of present illness  MEDICAL HISTORY:  Past Medical History:  Diagnosis Date  . Chicken pox   . [place]    Partial colectomy 07/2015 + chemo tx's  . Hypertension   . Hypokalemia   . Menopause    > 5 yrs  . Peripheral neuropathy due to chemotherapy ([facility]     SURGICAL HISTORY: Past Surgical History:  Procedure Laterality Date  . BREAST BIOPSY Right    2007? pt unsure  done by [facility]  . COLONOSCOPY    . COLONOSCOPY WITH PROPOFOL N/A 10/14/2015   Procedure: COLONOSCOPY WITH PROPOFOL;  Surgeon: PHulen Luster MD;  [facility]  Service: Endoscopy;  Laterality: N/A;  . COLOSTOMY REVISION Right 08/09/2015   Procedure: COLON RESECTION RIGHT;  Surgeon: RFlorene Glen MD;  Location: [facility] ORS;  Service: General;  Laterality: Right;  . ECTOPIC PREGNANCY SURGERY    . PORTACATH PLACEMENT N/A 09/01/2015   Procedure: INSERTION PORT-A-CATH;  Surgeon: RFlorene Glen MD;  Location: [facility] ORS;  Service: General;  Laterality: N/A;  . SHOULDER SURGERY Left     [place]  1 pack in 3 days; no alcohol. 2 childern [25 & 17 years] Social History   Socioeconomic History  . Marital status: Married    Spouse name: Not on file  . Number of children: Not on file  . Years of education: Not on file  . Highest education level: Not on file  Occupational History  . Not on file  Social Needs  . Financial resource strain: Not on file  . Food insecurity:    Worry: Not on file    Inability: Not on file  . Transportation needs:    Medical: Not on file    Non-medical: Not on file  Tobacco Use  . Smoking status: Former Smoker    Packs/day: 0.25    Years: 2.00  Pack years: 0.50  . Smokeless tobacco: Never Used  . Tobacco comment: recently quit  Substance and Sexual Activity  . Alcohol use: No    Alcohol/week: 0.0 oz  . Drug use: No  . Sexual activity: Yes  Lifestyle  . Physical activity:    Days per week: Not on file    Minutes per session: Not on file  . Stress: Not on file  Relationships  . Social connections:    Talks on phone: Not on file    Gets together: Not on file    Attends religious service: Not on file    Active member of club or organization: Not on file    Attends meetings of clubs or organizations: Not on file    Relationship status: Not on file  . Intimate partner violence:    Fear of current or  ex partner: Not on file    Emotionally abused: Not on file    Physically abused: Not on file    Forced sexual activity: Not on file  Other Topics Concern  . Not on file  Social History Narrative  . Not on file    FAMILY HISTORY: 1 brother& 1 sister- no cancer. Mom-no cancer Family History  Problem Relation Age of Onset  . Hypertension Mother   . Arthritis Father   . Prostate cancer Father   . Diabetes Maternal Grandmother   . Colon cancer Maternal Grandfather        colon    ALLERGIES:  has No Known Allergies.  MEDICATIONS:  Current Outpatient Medications  Medication Sig Dispense Refill  . acetaminophen (TYLENOL) 325 MG tablet Take 2 tablets (650 mg total) by mouth every 6 (six) hours as needed for mild pain (or Fever >/= 101).    . benazepril (LOTENSIN) 20 MG tablet Take 1 tablet (20 mg total) by mouth daily. 30 tablet 6  . DULoxetine (CYMBALTA) 20 MG capsule Take 2 capsules (40 mg total) by mouth daily. 90 capsule 1  . feeding supplement (BOOST HIGH PROTEIN) LIQD Take 1 Container by mouth daily.    . ferrous sulfate (FERROUSUL) 325 (65 FE) MG tablet Take 1 tablet (325 mg total) by mouth daily with breakfast. 90 tablet 3  . gabapentin (NEURONTIN) 300 MG capsule One pill in am; and 2 pills at night prior to sleep. 90 capsule 3  . hydrochlorothiazide (MICROZIDE) 12.5 MG capsule Take 1 capsule (12.5 mg total) by mouth daily. 30 capsule 6  . HYDROcodone-acetaminophen (NORCO/VICODIN) 5-325 MG tablet Take 1 tablet by mouth every 12 (twelve) hours as needed for moderate pain. 60 tablet 0  . lidocaine-prilocaine (EMLA) cream Apply 1 application topically as needed. Apply generously over the Mediport 45 minutes prior to chemotherapy. 30 g 6  . Multiple Vitamins-Minerals (MULTIVITAMIN ADULT PO) Take 1 tablet by mouth daily.    . potassium chloride (K-DUR) 10 MEQ tablet Take 6 tablets (60 mEq total) by mouth daily. 540 tablet 0  . diphenoxylate-atropine (LOMOTIL) 2.5-0.025 MG tablet Take 1  tablet by mouth 4 (four) times daily as needed for diarrhea or loose stools. Take it along with immodium (Patient not taking: Reported on 09/10/2017) 40 tablet 4  . loperamide (IMODIUM) 2 MG capsule Take 2 mg by mouth as needed for diarrhea or loose stools.    . ondansetron (ZOFRAN) 8 MG tablet Take 1 tablet (8 mg total) by mouth every 8 (eight) hours as needed for nausea or vomiting (start 3 days; after chemo). (Patient not taking: Reported on 09/10/2017)  40 tablet 0  . prochlorperazine (COMPAZINE) 10 MG tablet Take 1 tablet (10 mg total) by mouth every 6 (six) hours as needed for nausea or vomiting. (Patient not taking: Reported on 09/10/2017) 90 tablet 1   No current facility-administered medications for this visit.    Facility-Administered Medications Ordered in Other Visits  Medication Dose Route Frequency Provider Last Rate Last Dose  . sodium chloride flush (NS) 0.9 % injection 10 mL  10 mL Intravenous PRN Cammie Sickle, MD   10 mL at 10/04/15 0901  . sodium chloride flush (NS) 0.9 % injection 10 mL  10 mL Intravenous PRN Charlaine Dalton R, MD   10 mL at 07/02/17 1020      PHYSICAL EXAMINATION: ECOG PERFORMANCE STATUS: 0 - Asymptomatic  Vitals:   09/10/17 1000  BP: 115/78  Pulse: 75  Resp: 18  Temp: 97.9 F (36.6 C)   Filed Weights   09/10/17 1000  Weight: 108 lb (49 kg)    GENERAL: Well-nourished well-developed; thin built; Alert, no distress and comfortable.   Accompanied by her mother.  EYES: no pallor or icterus OROPHARYNX: no thrush or ulceration NECK: supple, no masses felt LYMPH:  no palpable lymphadenopathy in the cervical, axillary or inguinal regions LUNGS: clear to auscultation and  No wheeze or crackles HEART/CVS: regular rate & rhythm and no murmurs; No lower extremity edema ABDOMEN: abdomen soft, non-tender and normal bowel sounds; No infections. Musculoskeletal:no cyanosis of digits and no clubbing  PSYCH: alert & oriented x 3 with fluent  speech NEURO: no focal motor/sensory deficits SKIN:  no rashes or significant lesions  LABORATORY DATA:  I have reviewed the data as listed Lab Results  Component Value Date   WBC 5.9 09/10/2017   HGB 12.4 09/10/2017   HCT 36.5 09/10/2017   MCV 91.0 09/10/2017   PLT 136 (L) 09/10/2017   Recent Labs    07/02/17 1020 07/30/17 1020 09/10/17 0948  NA 134* 137 138  K 3.7 3.9 3.4*  CL 102 104 103  CO2 27 24 26   GLUCOSE 106* 98 98  BUN 18 11 14   CREATININE 0.86 0.85 0.82  CALCIUM 8.8* 9.2 9.1  GFRNONAA >60 >60 >60  GFRAA >60 >60 >60  PROT 7.3 7.2 6.7  ALBUMIN 4.0 4.1 3.7  AST 20 21 15   ALT 12* 16 9*  ALKPHOS 67 78 77  BILITOT 0.8 0.3 0.5   IMPRESSION: 1. Similar left-sided L2 sclerosis which remains indeterminate but suspicious for an atypical appearance of isolated colon cancer metastasis. 2. Otherwise, no evidence of metastatic disease in the chest, abdomen, or pelvis. 3.  Aortic Atherosclerosis (ICD10-I70.0).   Electronically Signed   By: Abigail Miyamoto M.D.   On: 04/10/2017 13:17  IMPRESSION: Persistent uptake of tracer at the LEFT lateral aspect of the L2 vertebral body, corresponding to an area of sclerosis on CT, cannot exclude metastasis.  No new scintigraphic abnormalities identified.   Electronically Signed   By: Lavonia Dana M.D.   On: 04/20/2017 17:21  -----------------------------------------------------     IMPRESSION: MRI THORACIC SPINE:  1. No evidence of metastasis or acute process. 2. Degenerative change of the thoracic spine without canal stenosis or neural foraminal narrowing.  MRI LUMBAR SPINE:  1. Non mass-like and signal and enhancement L2 vertebral body corresponding to CT abnormality. Given surrounding post radiation changes, this favors radiation osteonecrosis, less likely solitary metastasis. 2. Minimal grade 1 L5-S1 anterolisthesis on degenerative basis. 3. No canal stenosis. Minimal to mild L3-4 through  L5-S1  neural foraminal narrowing.   Electronically Signed   By: Elon Alas M.D.   On: 06/08/2017 20:53  -------------------------------------------------------------------------     IMPRESSION: 1. Similar left-sided L2 sclerosis. Recent MRI from 06/08/2017 favors sequelae of external beam radiation. 2. No evidence for solid organ or nodal metastasis within the abdomen or pelvis.   Electronically Signed   By: Kerby Moors M.D.   On: 07/26/2017 13:50   ASSESSMENT & PLAN:   .Cancer of ascending colon metastatic to intra-abdominal lymph node ([place]) # Colon cancer- cecal/ right-sided; STAGE IV [intraabdominal/retroperitonel LN/Supracla; K-ras mutated].  5-FU Avastin chemotherapy; currently on hold/chemo holiday.  CT scan March 2019-NED; except for L2 bone lesion- sclerosis [see discussion below].  # Continue to hold the 5-FU Avastin chemotherapy/chemo holiday; last CEA feb 4.8/ CT ordered today.   # Thrombocytopenia platelets 130s today/- G-1. Monitor for now.   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 symptomatic L2 bone lesion status post radiation finished May 2018 MRI of thoracic lumbar spine- ? L2 radiation necrosis; hold off radiation.   # follow up in 8 weeks/lab- CEA/ port flush; CT ca/p scan prior.      GCammie Sickle MD 09/11/2017 8:04 AM

## 2017-09-11 LAB — CEA: CEA: 5.4 ng/mL — ABNORMAL HIGH (ref 0.0–4.7)

## 2017-09-13 ENCOUNTER — Other Ambulatory Visit: Payer: Self-pay | Admitting: Internal Medicine

## 2017-09-17 ENCOUNTER — Other Ambulatory Visit: Payer: Self-pay | Admitting: *Deleted

## 2017-09-17 MED ORDER — DULOXETINE HCL 20 MG PO CPEP: 40.0000 mg | ORAL_CAPSULE | Freq: Every day | ORAL | 0 refills | Status: DC

## 2017-09-18 ENCOUNTER — Telehealth: Payer: Self-pay | Admitting: Internal Medicine

## 2017-09-18 NOTE — Telephone Encounter (Signed)
Patient had called regarding narcotic pain medication prescription. I called her back-left a message to call us back in the morning.

## 2017-10-12 ENCOUNTER — Other Ambulatory Visit: Payer: Self-pay | Admitting: Internal Medicine

## 2017-11-02 ENCOUNTER — Ambulatory Visit
Admission: RE | Admit: 2017-11-02 | Discharge: 2017-11-02 | Disposition: A | Discharge status: Home / Self Care | Payer: BLUE CROSS/BLUE SHIELD | Source: Ambulatory Visit | Attending: Internal Medicine | Admitting: Internal Medicine

## 2017-11-02 DIAGNOSIS — C182 Malignant neoplasm of ascending colon: Secondary | ICD-10-CM | POA: Diagnosis present

## 2017-11-02 DIAGNOSIS — C772 Secondary and unspecified malignant neoplasm of intra-abdominal lymph nodes: Secondary | ICD-10-CM | POA: Insufficient documentation

## 2017-11-02 DIAGNOSIS — Y842 Radiological procedure and radiotherapy as the cause of abnormal reaction of the patient, or of later complication, without mention of misadventure at the time of the procedure: Secondary | ICD-10-CM | POA: Diagnosis not present

## 2017-11-02 MED ORDER — IOPAMIDOL (ISOVUE-300) INJECTION 61%
85.0000 mL | Freq: Once | INTRAVENOUS | Status: AC | PRN
  Administered 2017-11-02: 85 mL via INTRAVENOUS

## 2017-11-05 ENCOUNTER — Other Ambulatory Visit: Payer: Self-pay

## 2017-11-05 ENCOUNTER — Inpatient Hospital Stay (HOSPITAL_BASED_OUTPATIENT_CLINIC_OR_DEPARTMENT_OTHER): Payer: BLUE CROSS/BLUE SHIELD | Admitting: Oncology

## 2017-11-05 ENCOUNTER — Encounter: Payer: Self-pay | Admitting: Oncology

## 2017-11-05 ENCOUNTER — Inpatient Hospital Stay: Payer: BLUE CROSS/BLUE SHIELD

## 2017-11-05 ENCOUNTER — Inpatient Hospital Stay: Payer: BLUE CROSS/BLUE SHIELD | Attending: Oncology

## 2017-11-05 VITALS — BP 96/64 | HR 73 | Temp 98.0°F | Resp 12 | Ht 60.0 in | Wt 106.6 lb

## 2017-11-05 DIAGNOSIS — C772 Secondary and unspecified malignant neoplasm of intra-abdominal lymph nodes: Secondary | ICD-10-CM | POA: Diagnosis not present

## 2017-11-05 DIAGNOSIS — R197 Diarrhea, unspecified: Secondary | ICD-10-CM | POA: Diagnosis not present

## 2017-11-05 DIAGNOSIS — C182 Malignant neoplasm of ascending colon: Secondary | ICD-10-CM

## 2017-11-05 DIAGNOSIS — Z87891 Personal history of nicotine dependence: Secondary | ICD-10-CM | POA: Insufficient documentation

## 2017-11-05 DIAGNOSIS — R110 Nausea: Secondary | ICD-10-CM

## 2017-11-05 DIAGNOSIS — I700 Atherosclerosis of aorta: Secondary | ICD-10-CM

## 2017-11-05 DIAGNOSIS — C801 Malignant (primary) neoplasm, unspecified: Secondary | ICD-10-CM

## 2017-11-05 DIAGNOSIS — M8580 Other specified disorders of bone density and structure, unspecified site: Secondary | ICD-10-CM | POA: Insufficient documentation

## 2017-11-05 LAB — GASTROINTESTINAL PANEL BY PCR, STOOL (REPLACES STOOL CULTURE)
ASTROVIRUS: NOT DETECTED
Adenovirus F40/41: NOT DETECTED
Campylobacter species: NOT DETECTED
Cryptosporidium: NOT DETECTED
Cyclospora cayetanensis: NOT DETECTED
ENTAMOEBA HISTOLYTICA: NOT DETECTED
ENTEROAGGREGATIVE E COLI (EAEC): NOT DETECTED
ENTEROTOXIGENIC E COLI (ETEC): NOT DETECTED
Enteropathogenic E coli (EPEC): NOT DETECTED
GIARDIA LAMBLIA: NOT DETECTED
NOROVIRUS GI/GII: NOT DETECTED
Plesimonas shigelloides: NOT DETECTED
ROTAVIRUS A: NOT DETECTED
SAPOVIRUS (I, II, IV, AND V): NOT DETECTED
SHIGA LIKE TOXIN PRODUCING E COLI (STEC): NOT DETECTED
Salmonella species: NOT DETECTED
Shigella/Enteroinvasive E coli (EIEC): NOT DETECTED
VIBRIO CHOLERAE: NOT DETECTED
Vibrio species: NOT DETECTED
Yersinia enterocolitica: NOT DETECTED

## 2017-11-05 LAB — C DIFFICILE QUICK SCREEN W PCR REFLEX
C Diff antigen: NEGATIVE
C Diff interpretation: NOT DETECTED
C Diff toxin: NEGATIVE

## 2017-11-05 MED ORDER — HYDROCODONE-ACETAMINOPHEN 5-325 MG PO TABS: 1.0000 | ORAL_TABLET | Freq: Two times a day (BID) | ORAL | 0 refills | Status: DC | PRN

## 2017-11-05 MED ORDER — LIDOCAINE 5 % EX PTCH: 1.0000 | MEDICATED_PATCH | CUTANEOUS | 0 refills | Status: DC

## 2017-11-05 MED ORDER — HEPARIN SOD (PORK) LOCK FLUSH 100 UNIT/ML IV SOLN
500.0000 [IU] | Freq: Once | INTRAVENOUS | Status: AC
  Administered 2017-11-05: 500 [IU] via INTRAVENOUS

## 2017-11-05 MED ORDER — GABAPENTIN 300 MG PO CAPS: ORAL_CAPSULE | ORAL | 3 refills | Status: DC

## 2017-11-05 MED ORDER — DIPHENOXYLATE-ATROPINE 2.5-0.025 MG PO TABS: 1.0000 | ORAL_TABLET | Freq: Four times a day (QID) | ORAL | 0 refills | Status: DC | PRN

## 2017-11-05 MED ORDER — SODIUM CHLORIDE 0.9% FLUSH
10.0000 mL | INTRAVENOUS | Status: DC | PRN
  Administered 2017-11-05: 10 mL via INTRAVENOUS
  Filled 2017-11-05: qty 10

## 2017-11-05 NOTE — Addendum Note (Signed)
Addended by: Oneida Arenas on: 11/05/2017 04:23 PM   Modules accepted: Orders

## 2017-11-05 NOTE — Progress Notes (Signed)
[facility] CONSULT NOTE  Patient Care Team: Coral Spikes, DO as PCP - General (Family Medicine)  CHIEF COMPLAINTS/PURPOSE OF CONSULTATION:   Oncology History   # [facility] 2017- COLON CANCER- STAGE IV [s/p right hemi-colectomy; pT4apN2 (7/19LN)]; Pre-op CEA- [id number]  PET-  mesenteric adenopathy/mediastinal adenopathy/right-sided subclavicular lymph node.    April 17th -START FOLFOX; May 1st Add Avastin;   Aug 16th- Disc [name] [place] PN]; con 5FU-Avastin; NOV 27th CT C/A/P- CR; except for 61m LUL; 5FU-Avastin q 3 W  # March 2018- L2 uptake MET s/p RT [[facility]2018]; mid April X-geva   # MOLECULAR TESTING: K-ras-EXON 2- MUTATED; MSI- STABLE.      Cancer of ascending colon metastatic to intra-abdominal lymph node ([place]   09/06/2015 Initial Diagnosis    Cancer of ascending colon metastatic to intra-abdominal lymph node (HCC)        HISTORY OF PRESENTING ILLNESS:   Patient presents to clinic today for follow-up after completing 5-FU plus Avastin chemotherapy.  Last given in December 2018.  She is currently on a chemo holiday.  She recently had CT scans and is interested in results.  Complains of worsening lumbar back pain.  States pain medication helps "some".  [facility].  She denies any black or tarry stools or abdominal pain.  Energy level remains the same, denies any headaches, denies constipation   Complains of diarrhea x 2  weeks and nausea without vomiting. Uses Imodium with mild relief.  Review of Systems  Constitutional: Negative.  Negative for chills, fever, malaise/fatigue and weight loss.  HENT: Negative for congestion and ear pain.   Eyes: Negative.  Negative for blurred vision and double vision.  Respiratory: Negative.  Negative for cough, sputum production and shortness of breath.   Cardiovascular: Negative.  Negative for chest pain, palpitations and leg swelling.  Gastrointestinal: Positive for diarrhea (X 2 weeks).  Negative for abdominal pain, constipation, nausea and vomiting.  Genitourinary: Negative for dysuria, frequency and urgency.  Musculoskeletal: Positive for back pain (Lumbar pain). Negative for falls.  Skin: Negative.  Negative for rash.  Neurological: Negative.  Negative for weakness and headaches.  Endo/Heme/Allergies: Negative.  Does not bruise/bleed easily.  Psychiatric/Behavioral: Negative.  Negative for depression. The patient is not nervous/anxious and does not have insomnia.     MEDICAL HISTORY:  Past Medical History:  Diagnosis Date  . Chicken pox   . Colon cancer ([place]    Partial colectomy 07/2015 + chemo tx's  . Hypertension   . Hypokalemia   . Menopause    > 5 yrs  . Peripheral neuropathy due to chemotherapy ([facility]     SURGICAL HISTORY: Past Surgical History:  Procedure Laterality Date  . BREAST BIOPSY Right    2007? pt unsure done by [facility]  . COLONOSCOPY    . COLONOSCOPY WITH PROPOFOL N/A 10/14/2015   Procedure: COLONOSCOPY WITH PROPOFOL;  Surgeon: PHulen Luster MD;  [facility];  Service: Endoscopy;  Laterality: N/A;  . COLOSTOMY REVISION Right 08/09/2015   Procedure: COLON RESECTION RIGHT;  Surgeon: RFlorene Glen MD;  Location: [facility] ORS;  Service: General;  Laterality: Right;  . ECTOPIC PREGNANCY SURGERY    . PORTACATH PLACEMENT N/A 09/01/2015   Procedure: INSERTION PORT-A-CATH;  Surgeon: RFlorene Glen MD;  Location: [facility] ORS;  Service: General;  Laterality: N/A;  . SHOULDER SURGERY Left     [place]  1 pack in 3 days; no  alcohol. 2 childern [25 & 17 years] Social History   Socioeconomic History  . Marital status: Married    Spouse name: Not on file  . Number of children: Not on file  . Years of education: Not on file  . Highest education level: Not on file  Occupational History  . Not on file  Social Needs  . Financial resource strain: Not on file  . Food insecurity:    Worry: Not on file     Inability: Not on file  . Transportation needs:    Medical: Not on file    Non-medical: Not on file  Tobacco Use  . Smoking status: Former Smoker    Packs/day: 0.25    Years: 2.00    Pack years: 0.50  . Smokeless tobacco: Never Used  . Tobacco comment: recently quit  Substance and Sexual Activity  . Alcohol use: No    Alcohol/week: 0.0 oz  . Drug use: No  . Sexual activity: Yes  Lifestyle  . Physical activity:    Days per week: Not on file    Minutes per session: Not on file  . Stress: Not on file  Relationships  . Social connections:    Talks on phone: Not on file    Gets together: Not on file    Attends religious service: Not on file    Active member of club or organization: Not on file    Attends meetings of clubs or organizations: Not on file    Relationship status: Not on file  . Intimate partner violence:    Fear of current or ex partner: Not on file    Emotionally abused: Not on file    Physically abused: Not on file    Forced sexual activity: Not on file  Other Topics Concern  . Not on file  Social History Narrative  . Not on file    FAMILY HISTORY: 1 brother& 1 sister- no cancer. Mom-no cancer Family History  Problem Relation Age of Onset  . Hypertension Mother   . Arthritis Father   . Prostate cancer Father   . Diabetes Maternal Grandmother   . Colon cancer Maternal Grandfather        colon    ALLERGIES:  has No Known Allergies.  MEDICATIONS:  Current Outpatient Medications  Medication Sig Dispense Refill  . acetaminophen (TYLENOL) 325 MG tablet Take 2 tablets (650 mg total) by mouth every 6 (six) hours as needed for mild pain (or Fever >/= 101).    . benazepril (LOTENSIN) 20 MG tablet TAKE 1 TABLET DAILY 90 tablet 1  . diphenoxylate-atropine (LOMOTIL) 2.5-0.025 MG tablet Take 1 tablet by mouth 4 (four) times daily as needed for diarrhea or loose stools. Take it along with immodium 40 tablet 4  . DULoxetine (CYMBALTA) 20 MG capsule Take 2 capsules  (40 mg total) by mouth daily. 180 capsule 0  . feeding supplement (BOOST HIGH PROTEIN) LIQD Take 1 Container by mouth daily.    . ferrous sulfate (FERROUSUL) 325 (65 FE) MG tablet Take 1 tablet (325 mg total) by mouth daily with breakfast. 90 tablet 3  . gabapentin (NEURONTIN) 300 MG capsule One pill in am; and 2 pills at night prior to sleep. 90 capsule 3  . hydrochlorothiazide (MICROZIDE) 12.5 MG capsule Take 1 capsule (12.5 mg total) by mouth daily. 30 capsule 6  . HYDROcodone-acetaminophen (NORCO/VICODIN) 5-325 MG tablet Take 1 tablet by mouth every 12 (twelve) hours as needed for moderate pain. 60 tablet 0  .  lidocaine-prilocaine (EMLA) cream Apply 1 application topically as needed. Apply generously over the Mediport 45 minutes prior to chemotherapy. 30 g 6  . loperamide (IMODIUM) 2 MG capsule Take 2 mg by mouth as needed for diarrhea or loose stools.    . Multiple Vitamins-Minerals (MULTIVITAMIN ADULT PO) Take 1 tablet by mouth daily.    . ondansetron (ZOFRAN) 8 MG tablet Take 1 tablet (8 mg total) by mouth every 8 (eight) hours as needed for nausea or vomiting (start 3 days; after chemo). 40 tablet 0  . potassium chloride (K-DUR) 10 MEQ tablet Take 6 tablets (60 mEq total) by mouth daily. 540 tablet 0  . prochlorperazine (COMPAZINE) 10 MG tablet Take 1 tablet (10 mg total) by mouth every 6 (six) hours as needed for nausea or vomiting. 90 tablet 1  . diphenoxylate-atropine (LOMOTIL) 2.5-0.025 MG tablet Take 1 tablet by mouth 4 (four) times daily as needed for diarrhea or loose stools. 30 tablet 0  . lidocaine (LIDODERM) 5 % Place 1 patch onto the skin daily. Remove & Discard patch within 12 hours or as directed by MD 30 patch 0   No current facility-administered medications for this visit.    Facility-Administered Medications Ordered in Other Visits  Medication Dose Route Frequency Provider Last Rate Last Dose  . sodium chloride flush (NS) 0.9 % injection 10 mL  10 mL Intravenous PRN  Cammie Sickle, MD   10 mL at 10/04/15 0901  . sodium chloride flush (NS) 0.9 % injection 10 mL  10 mL Intravenous PRN Charlaine Dalton R, MD   10 mL at 07/02/17 1020      PHYSICAL EXAMINATION: ECOG PERFORMANCE STATUS: 0 - Asymptomatic  Vitals:   11/05/17 1008 11/05/17 1013  BP:  96/64  Pulse:  73  Resp: 12   Temp:  98 F (36.7 C)   Filed Weights   11/05/17 1008  Weight: 106 lb 9.6 oz (48.4 kg)    Physical Exam  Constitutional: She is oriented to person, place, and time. Vital signs are normal. She appears well-developed and well-nourished.  HENT:  Head: Normocephalic and atraumatic.  Eyes: Pupils are equal, round, and reactive to light.  Neck: Normal range of motion.  Cardiovascular: Normal rate, regular rhythm and normal heart sounds.  No murmur heard. Pulmonary/Chest: Effort normal and breath sounds normal. She has no wheezes.  Abdominal: Soft. Normal appearance and bowel sounds are normal. She exhibits no distension. There is no tenderness.  Musculoskeletal: Normal range of motion. She exhibits tenderness. She exhibits no edema.       Lumbar back: She exhibits tenderness.  Neurological: She is alert and oriented to person, place, and time.  Skin: Skin is warm and dry. No rash noted.  Psychiatric: Judgment normal.    LABORATORY DATA:  I have reviewed the data as listed Lab Results  Component Value Date   WBC 5.9 09/10/2017   HGB 12.4 09/10/2017   HCT 36.5 09/10/2017   MCV 91.0 09/10/2017   PLT 136 (L) 09/10/2017   Recent Labs    07/02/17 1020 07/30/17 1020 09/10/17 0948  NA 134* 137 138  K 3.7 3.9 3.4*  CL 102 104 103  CO2 27 24 26   GLUCOSE 106* 98 98  BUN 18 11 14   CREATININE 0.86 0.85 0.82  CALCIUM 8.8* 9.2 9.1  GFRNONAA >60 >60 >60  GFRAA >60 >60 >60  PROT 7.3 7.2 6.7  ALBUMIN 4.0 4.1 3.7  AST 20 21 15   ALT 12* 16 9*  ALKPHOS 67 78 77  BILITOT 0.8 0.3 0.5   IMPRESSION: 1. Similar left-sided L2 sclerosis which remains indeterminate  but suspicious for an atypical appearance of isolated colon cancer metastasis. 2. Otherwise, no evidence of metastatic disease in the chest, abdomen, or pelvis. 3.  Aortic Atherosclerosis (ICD10-I70.0).   Electronically Signed   By: Abigail Miyamoto M.D.   On: 04/10/2017 13:17  IMPRESSION: Persistent uptake of tracer at the LEFT lateral aspect of the L2 vertebral body, corresponding to an area of sclerosis on CT, cannot exclude metastasis.  No new scintigraphic abnormalities identified.   Electronically Signed   By: Lavonia Dana M.D.   On: 04/20/2017 17:21  -----------------------------------------------------     IMPRESSION: MRI THORACIC SPINE:  1. No evidence of metastasis or acute process. 2. Degenerative change of the thoracic spine without canal stenosis or neural foraminal narrowing.  MRI LUMBAR SPINE:  1. Non mass-like and signal and enhancement L2 vertebral body corresponding to CT abnormality. Given surrounding post radiation changes, this favors radiation osteonecrosis, less likely solitary metastasis. 2. Minimal grade 1 L5-S1 anterolisthesis on degenerative basis. 3. No canal stenosis. Minimal to mild L3-4 through L5-S1 neural foraminal narrowing.   Electronically Signed   By: Elon Alas M.D.   On: 06/08/2017 20:53  -------------------------------------------------------------------------     IMPRESSION: 1. Similar left-sided L2 sclerosis. Recent MRI from 06/08/2017 favors sequelae of external beam radiation. 2. No evidence for solid organ or nodal metastasis within the abdomen or pelvis.   Electronically Signed   By: Kerby Moors M.D.   On: 07/26/2017 13:50   ASSESSMENT & PLAN:   .Cancer of ascending colon metastatic to intra-abdominal lymph node ([place]) # Colon cancer- cecal/ right-sided; STAGE IV [intraabdominal/retroperitonel LN/Supracla; K-ras mutated].  5-FU Avastin chemotherapy; currently on hold/chemo holiday.  CT scan  March 2019-NED; except for L2 bone lesion- sclerosis   Most recent CT scan from 11/02/2017 showed new hypodense lesion in liver concerning for hepatic metastasis.  Consider hepatic MRI with contrast to characterize lesion.  Stable peripheral hypodense lesions consistent with benign cysts and stable sclerotic lesion at L2.  We will get  MRI of liver set up for this week.  Scheduled for 11/15/2017.  Will schedule next appointment based on MRI of liver results.  We will also add a CEA to today's labs.  Patient needs a refill on several medications.  We will get this refilled for her today.  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  Continue Neurontin and Cymbalta as prescribed.  Lumbar back pain: Completed radiation in May 2018.  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  Hold off on radiation at this time.  Diarrhea: We will get stool samples to rule out C. difficile and GI pathogens.  Patient on able to produce sample while in clinic.  Provided her with equipment to collect at home and bring back to her lab.  We will call her with results.  RTC after scheduled MRI of liver to talk with Dr. Rogue Bussing about results and future treatment planning.    Jacquelin Hawking, NP 11/05/2017 2:22 PM

## 2017-11-05 NOTE — Progress Notes (Signed)
Patient here for follow up she reports continued diarrhea that kresponds to immodium but then returns. She has lost 2 pounds since her last visit. She needs a reifll for gabapentin.

## 2017-11-05 NOTE — Assessment & Plan Note (Addendum)
#  Colon cancer- cecal/ right-sided; STAGE IV [intraabdominal/retroperitonel LN/Supracla; K-ras mutated].  5-FU Avastin chemotherapy; currently on hold/chemo holiday.  CT scan March 2019-NED; except for L2 bone lesion- sclerosis   Most recent CT scan from 11/02/2017 showed new hypodense lesion in liver concerning for hepatic metastasis.  Consider hepatic MRI with contrast to characterize lesion.  Stable peripheral hypodense lesions consistent with benign cysts and stable sclerotic lesion at L2.  We will get  MRI of liver set up for this week.  Scheduled for 11/15/2017.  Will schedule next appointment based on MRI of liver results.  We will also add a CEA to today's labs.  Patient needs a refill on several medications.  We will get this refilled for her today.  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  Continue Neurontin and Cymbalta as prescribed.  Lumbar back pain: Completed radiation in May 2018.  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  Hold off on radiation at this time.  Diarrhea: We will get stool samples to rule out C. difficile and GI pathogens.  Patient on able to produce sample while in clinic.  Provided her with equipment to collect at home and bring back to her lab.  We will call her with results.  RTC after scheduled MRI of liver to talk with Dr. Rogue Bussing about results and future treatment planning.

## 2017-11-05 NOTE — Addendum Note (Signed)
Addended by: Oneida Arenas on: 11/05/2017 12:11 PM   Modules accepted: Orders

## 2017-11-06 LAB — CEA: CEA1: 8 ng/mL — AB (ref 0.0–4.7)

## 2017-11-14 ENCOUNTER — Other Ambulatory Visit: Payer: Self-pay | Admitting: *Deleted

## 2017-11-14 DIAGNOSIS — Z01812 Encounter for preprocedural laboratory examination: Secondary | ICD-10-CM

## 2017-11-15 ENCOUNTER — Ambulatory Visit
Admission: RE | Admit: 2017-11-15 | Discharge: 2017-11-15 | Disposition: A | Discharge status: Home / Self Care | Payer: BLUE CROSS/BLUE SHIELD | Source: Ambulatory Visit | Attending: Oncology | Admitting: Oncology

## 2017-11-15 DIAGNOSIS — R590 Localized enlarged lymph nodes: Secondary | ICD-10-CM | POA: Insufficient documentation

## 2017-11-15 DIAGNOSIS — M899 Disorder of bone, unspecified: Secondary | ICD-10-CM | POA: Insufficient documentation

## 2017-11-15 DIAGNOSIS — K7689 Other specified diseases of liver: Secondary | ICD-10-CM | POA: Insufficient documentation

## 2017-11-15 DIAGNOSIS — C772 Secondary and unspecified malignant neoplasm of intra-abdominal lymph nodes: Secondary | ICD-10-CM | POA: Insufficient documentation

## 2017-11-15 DIAGNOSIS — C182 Malignant neoplasm of ascending colon: Secondary | ICD-10-CM | POA: Insufficient documentation

## 2017-11-15 MED ORDER — GADOBENATE DIMEGLUMINE 529 MG/ML IV SOLN
10.0000 mL | Freq: Once | INTRAVENOUS | Status: AC | PRN
  Administered 2017-11-15: 10 mL via INTRAVENOUS

## 2017-11-18 ENCOUNTER — Telehealth: Payer: Self-pay | Admitting: Internal Medicine

## 2017-11-18 DIAGNOSIS — C182 Malignant neoplasm of ascending colon: Secondary | ICD-10-CM

## 2017-11-18 DIAGNOSIS — C772 Secondary and unspecified malignant neoplasm of intra-abdominal lymph nodes: Principal | ICD-10-CM

## 2017-11-18 NOTE — Telephone Encounter (Signed)
Please have patient see me on July 3rd; 8: 45 to review imaging/plan of care; labs- cbc.cmp. Thx  Sonia Baller- Thx for the update.

## 2017-11-19 NOTE — Addendum Note (Signed)
Addended by: Sabino Gasser on: 11/19/2017 11:48 AM   Modules accepted: Orders

## 2017-11-21 ENCOUNTER — Inpatient Hospital Stay: Payer: BLUE CROSS/BLUE SHIELD | Attending: Internal Medicine

## 2017-11-21 ENCOUNTER — Inpatient Hospital Stay (HOSPITAL_BASED_OUTPATIENT_CLINIC_OR_DEPARTMENT_OTHER): Payer: BLUE CROSS/BLUE SHIELD | Admitting: Internal Medicine

## 2017-11-21 VITALS — BP 99/63 | HR 85 | Temp 97.1°F | Resp 16 | Wt 108.8 lb

## 2017-11-21 DIAGNOSIS — Z5112 Encounter for antineoplastic immunotherapy: Secondary | ICD-10-CM | POA: Diagnosis not present

## 2017-11-21 DIAGNOSIS — G620 Drug-induced polyneuropathy: Secondary | ICD-10-CM

## 2017-11-21 DIAGNOSIS — Z87891 Personal history of nicotine dependence: Secondary | ICD-10-CM

## 2017-11-21 DIAGNOSIS — C182 Malignant neoplasm of ascending colon: Secondary | ICD-10-CM | POA: Diagnosis present

## 2017-11-21 DIAGNOSIS — I10 Essential (primary) hypertension: Secondary | ICD-10-CM | POA: Diagnosis not present

## 2017-11-21 DIAGNOSIS — R197 Diarrhea, unspecified: Secondary | ICD-10-CM

## 2017-11-21 DIAGNOSIS — Z5111 Encounter for antineoplastic chemotherapy: Secondary | ICD-10-CM | POA: Diagnosis not present

## 2017-11-21 DIAGNOSIS — C772 Secondary and unspecified malignant neoplasm of intra-abdominal lymph nodes: Secondary | ICD-10-CM

## 2017-11-21 LAB — CBC WITH DIFFERENTIAL/PLATELET
BASOS ABS: 0.1 10*3/uL (ref 0–0.1)
Basophils Relative: 1 %
EOS ABS: 0.2 10*3/uL (ref 0–0.7)
EOS PCT: 3 %
HCT: 34.2 % — ABNORMAL LOW (ref 35.0–47.0)
Hemoglobin: 11.6 g/dL — ABNORMAL LOW (ref 12.0–16.0)
LYMPHS ABS: 1.2 10*3/uL (ref 1.0–3.6)
Lymphocytes Relative: 20 %
MCH: 30.5 pg (ref 26.0–34.0)
MCHC: 33.7 g/dL (ref 32.0–36.0)
MCV: 90.3 fL (ref 80.0–100.0)
MONO ABS: 0.5 10*3/uL (ref 0.2–0.9)
Monocytes Relative: 9 %
Neutro Abs: 4.2 10*3/uL (ref 1.4–6.5)
Neutrophils Relative %: 67 %
PLATELETS: 134 10*3/uL — AB (ref 150–440)
RBC: 3.79 MIL/uL — AB (ref 3.80–5.20)
RDW: 15.9 % — AB (ref 11.5–14.5)
WBC: 6.2 10*3/uL (ref 3.6–11.0)

## 2017-11-21 LAB — COMPREHENSIVE METABOLIC PANEL
ALBUMIN: 3.7 g/dL (ref 3.5–5.0)
ALK PHOS: 89 U/L (ref 38–126)
ALT: 11 U/L (ref 0–44)
AST: 16 U/L (ref 15–41)
Anion gap: 10 (ref 5–15)
BUN: 13 mg/dL (ref 6–20)
CHLORIDE: 106 mmol/L (ref 98–111)
CO2: 22 mmol/L (ref 22–32)
CREATININE: 0.92 mg/dL (ref 0.44–1.00)
Calcium: 9.1 mg/dL (ref 8.9–10.3)
GFR calc non Af Amer: >60 mL/min (ref >60)
GLUCOSE: 91 mg/dL (ref 70–99)
Potassium: 3.7 mmol/L (ref 3.5–5.1)
SODIUM: 138 mmol/L (ref 135–145)
Total Bilirubin: 0.5 mg/dL (ref 0.3–1.2)
Total Protein: 6.9 g/dL (ref 6.5–8.1)

## 2017-11-21 NOTE — Assessment & Plan Note (Addendum)
#   Colon cancer- cecal/ right-sided; STAGE IV currently on hold/chemo holiday.  CT scan June 2019 shows worsening liver mets; intra-abdominal metastases; question worsening L2 bone lesion- sclerosis; MRI liver- Worsened.  #Given the progression of disease discussed restarting chemotherapy-I would recommend FOLFIRI plus Avastin.  Patient understands treatments are palliative not curative.  Again reviewed the potential side effects including but not limited to diarrhea sores in the mouth; and the potential side effects of Avastin including but not limited to-wound healing problems nephrotic syndrome elevated blood pressure etc.  # diarrhea-unclear etiology.  2-3 loose stools/day;Recent stool testing negative for any infectious etiology.  improved with imodium; BRAT diet.    # follow up on July 15th/labs [vacation]- FOLFIRI+ avastin.; UA; pump dc-48 hours later  # I reviewed the blood work- with the patient in detail; also reviewed the imaging independently [as summarized above]; and with the patient in detail.    # 40 minutes face-to-face with the patient discussing the above plan of care; more than 50% of time spent on prognosis/ natural history; counseling and coordination.

## 2017-11-21 NOTE — Progress Notes (Signed)
[place] OFFICE PROGRESS NOTE  Patient Care Team: Coral Spikes, DO as PCP - General (Family Medicine)  Cancer Staging No matching staging information was found for the patient.   Oncology History   # [facility] 2017- COLON CANCER- STAGE IV [s/p right hemi-colectomy; pT4apN2 (7/19LN)]; Pre-op CEA- [id number]  PET-  mesenteric adenopathy/mediastinal adenopathy/right-sided subclavicular lymph node.    April 17th -START FOLFOX; May 1st Add Avastin;   Aug 16th- Disc OX [sec PN]; con 5FU-Avastin; NOV 27th CT C/A/P- CR; except for 86m LUL; 5FU-Avastin q 3 W [Poor tolerance to FOLFOX].  # June/July 2019- Progression of Liver/Abd LN; July 2019- START FOLFIRI + Avastin  # March 2018- L2 uptake MET s/p RT [[facility]2018]; mid April X-geva   # MOLECULAR TESTING: K-ras-EXON 2- MUTATED; MSI- STABLE.   DIAGNOSIS: colon ca  STAGE:  IV       ;GOALS: pallaitive  CURRENT/MOST RECENT THERAPY: FOLFIRI+Avastin      Cancer of ascending colon metastatic to intra-abdominal lymph node (HCC)      INTERVAL HISTORY:  Nancy [id number].o.  female pleasant patient above history of metastatic colon cancer most recently on chemo holiday is here to review the results of her restaging CAT scan/MRI.  Patient complains of diarrhea up to 1-2 loose stools a day; for the 2 to 3 weeks.  Denies abdominal cramping.  No blood in stools.  No chest pain or shortness of breath.  Positive for mild to moderate fatigue.  Review of Systems  Constitutional: Positive for malaise/fatigue. Negative for chills, diaphoresis, fever and weight loss.  HENT: Negative for nosebleeds and sore throat.   Eyes: Negative for double vision.  Respiratory: Negative for cough, hemoptysis, sputum production, shortness of breath and wheezing.   Cardiovascular: Negative for chest pain, palpitations, orthopnea and leg swelling.  Gastrointestinal: Positive for diarrhea. Negative for abdominal pain, blood in stool, constipation,  heartburn, melena, nausea and vomiting.  Genitourinary: Negative for dysuria, frequency and urgency.  Musculoskeletal: Negative for back pain and joint pain.  Skin: Negative.  Negative for itching and rash.  Neurological: Negative for dizziness, tingling, focal weakness, weakness and headaches.  Endo/Heme/Allergies: Does not bruise/bleed easily.  Psychiatric/Behavioral: Negative for depression. The patient is not nervous/anxious and does not have insomnia.       PAST MEDICAL HISTORY :  Past Medical History:  Diagnosis Date  . [place] pox   . [place]    Partial colectomy 07/2015 + chemo tx's  . Hypertension   . Hypokalemia   . Menopause    > 5 yrs  . Peripheral neuropathy due to chemotherapy ([facility]     PAST SURGICAL HISTORY :   Past Surgical History:  Procedure Laterality Date  . BREAST BIOPSY Right    2007? pt unsure done by [facility]  . COLONOSCOPY    . COLONOSCOPY WITH PROPOFOL N/A 10/14/2015   Procedure: COLONOSCOPY WITH PROPOFOL;  Surgeon: PHulen Luster MD;  [facility]  Service: Endoscopy;  Laterality: N/A;  . COLOSTOMY REVISION Right 08/09/2015   Procedure: COLON RESECTION RIGHT;  Surgeon: RFlorene Glen MD;  Location: [facility] ORS;  Service: General;  Laterality: Right;  . ECTOPIC PREGNANCY SURGERY    . PORTACATH PLACEMENT N/A 09/01/2015   Procedure: INSERTION PORT-A-CATH;  Surgeon: RFlorene Glen MD;  Location: [facility] ORS;  Service: General;  Laterality: N/A;  . SHOULDER SURGERY Left     FAMILY HISTORY :   Family History  Problem Relation Age of  Onset  . Hypertension Mother   . Arthritis Father   . Prostate cancer Father   . Diabetes Maternal Grandmother   . Colon cancer Maternal Grandfather        colon    SOCIAL HISTORY:   Social History   Tobacco Use  . Smoking status: Former Smoker    Packs/day: 0.25    Years: 2.00    Pack years: 0.50  . Smokeless tobacco: Never Used  . Tobacco comment: recently quit  Substance Use Topics  . Alcohol  use: No    Alcohol/week: 0.0 oz  . Drug use: No    ALLERGIES:  has No Known Allergies.  MEDICATIONS:  Current Outpatient Medications  Medication Sig Dispense Refill  . acetaminophen (TYLENOL) 325 MG tablet Take 2 tablets (650 mg total) by mouth every 6 (six) hours as needed for mild pain (or Fever >/= 101).    . benazepril (LOTENSIN) 20 MG tablet TAKE 1 TABLET DAILY 90 tablet 1  . diphenoxylate-atropine (LOMOTIL) 2.5-0.025 MG tablet Take 1 tablet by mouth 4 (four) times daily as needed for diarrhea or loose stools. Take it along with immodium 40 tablet 4  . diphenoxylate-atropine (LOMOTIL) 2.5-0.025 MG tablet Take 1 tablet by mouth 4 (four) times daily as needed for diarrhea or loose stools. 30 tablet 0  . DULoxetine (CYMBALTA) 20 MG capsule Take 2 capsules (40 mg total) by mouth daily. 180 capsule 0  . feeding supplement (BOOST HIGH PROTEIN) LIQD Take 1 Container by mouth daily.    . ferrous sulfate (FERROUSUL) 325 (65 FE) MG tablet Take 1 tablet (325 mg total) by mouth daily with breakfast. 90 tablet 3  . gabapentin (NEURONTIN) 300 MG capsule One pill in am; and 2 pills at night prior to sleep. 90 capsule 3  . hydrochlorothiazide (MICROZIDE) 12.5 MG capsule Take 1 capsule (12.5 mg total) by mouth daily. 30 capsule 6  . HYDROcodone-acetaminophen (NORCO/VICODIN) 5-325 MG tablet Take 1 tablet by mouth every 12 (twelve) hours as needed for moderate pain. 60 tablet 0  . lidocaine (LIDODERM) 5 % Place 1 patch onto the skin daily. Remove & Discard patch within 12 hours or as directed by MD 30 patch 0  . lidocaine-prilocaine (EMLA) cream Apply 1 application topically as needed. Apply generously over the Mediport 45 minutes prior to chemotherapy. 30 g 6  . loperamide (IMODIUM) 2 MG capsule Take 2 mg by mouth as needed for diarrhea or loose stools.    . Multiple Vitamins-Minerals (MULTIVITAMIN ADULT PO) Take 1 tablet by mouth daily.    . ondansetron (ZOFRAN) 8 MG tablet Take 1 tablet (8 mg total) by  mouth every 8 (eight) hours as needed for nausea or vomiting (start 3 days; after chemo). 40 tablet 0  . potassium chloride (K-DUR) 10 MEQ tablet Take 6 tablets (60 mEq total) by mouth daily. 540 tablet 0  . prochlorperazine (COMPAZINE) 10 MG tablet Take 1 tablet (10 mg total) by mouth every 6 (six) hours as needed for nausea or vomiting. 90 tablet 1   No current facility-administered medications for this visit.    Facility-Administered Medications Ordered in Other Visits  Medication Dose Route Frequency Provider Last Rate Last Dose  . sodium chloride flush (NS) 0.9 % injection 10 mL  10 mL Intravenous PRN Cammie Sickle, MD   10 mL at 10/04/15 0901  . sodium chloride flush (NS) 0.9 % injection 10 mL  10 mL Intravenous PRN Cammie Sickle, MD   10 mL at  07/02/17 1020    PHYSICAL EXAMINATION: ECOG PERFORMANCE STATUS: 1 - Symptomatic but completely ambulatory  BP 99/63 (BP Location: Left Arm, Patient Position: Sitting)   Pulse 85   Temp (!) 97.1 F (36.2 C) (Tympanic)   Resp 16   Wt 108 lb 12.8 oz (49.4 kg)   LMP  (LMP Unknown) Comment: LMP MORE THAN 5 YRS  BMI 21.25 kg/m   Filed Weights   11/21/17 0850  Weight: 108 lb 12.8 oz (49.4 kg)    GENERAL: Well-nourished well-developed; Alert, no distress and comfortable. /Accompanied by family.  EYES: no pallor or icterus OROPHARYNX: no thrush or ulceration; NECK: supple; no lymph nodes felt. LYMPH:  no palpable lymphadenopathy in the axillary or inguinal regions LUNGS: Decreased breath sounds auscultation bilaterally. No wheeze or crackles HEART/CVS: regular rate & rhythm and no murmurs; No lower extremity edema ABDOMEN:abdomen soft, non-tender and normal bowel sounds. No hepatomegaly or splenomegaly.  Musculoskeletal:no cyanosis of digits and no clubbing  PSYCH: alert & oriented x 3 with fluent speech NEURO: no focal motor/sensory deficits SKIN:  no rashes or significant lesions    LABORATORY DATA:  I have reviewed  the data as listed    Component Value Date/Time   NA 138 11/21/2017 0823   K 3.7 11/21/2017 0823   CL 106 11/21/2017 0823   CO2 22 11/21/2017 0823   GLUCOSE 91 11/21/2017 0823   BUN 13 11/21/2017 0823   CREATININE 0.92 11/21/2017 0823   CALCIUM 9.1 11/21/2017 0823   PROT 6.9 11/21/2017 0823   ALBUMIN 3.7 11/21/2017 0823   AST 16 11/21/2017 0823   ALT 11 11/21/2017 0823   ALKPHOS 89 11/21/2017 0823   BILITOT 0.5 11/21/2017 0823   GFRNONAA >60 11/21/2017 0823   GFRAA >60 11/21/2017 0823    No results found for: SPEP, UPEP  Lab Results  Component Value Date   WBC 6.2 11/21/2017   NEUTROABS 4.2 11/21/2017   HGB 11.6 (L) 11/21/2017   HCT 34.2 (L) 11/21/2017   MCV 90.3 11/21/2017   PLT 134 (L) 11/21/2017      Chemistry      Component Value Date/Time   NA 138 11/21/2017 0823   K 3.7 11/21/2017 0823   CL 106 11/21/2017 0823   CO2 22 11/21/2017 0823   BUN 13 11/21/2017 0823   CREATININE 0.92 11/21/2017 0823      Component Value Date/Time   CALCIUM 9.1 11/21/2017 0823   ALKPHOS 89 11/21/2017 0823   AST 16 11/21/2017 0823   ALT 11 11/21/2017 0823   BILITOT 0.5 11/21/2017 0823       RADIOGRAPHIC STUDIES: I have personally reviewed the radiological images as listed and agreed with the findings in the report. No results found.   ASSESSMENT & PLAN:  Cancer of ascending colon metastatic to intra-abdominal lymph node ([place]) # Colon cancer- cecal/ right-sided; STAGE IV currently on hold/chemo holiday.  CT scan June 2019 shows worsening liver mets; intra-abdominal metastases; question worsening L2 bone lesion- sclerosis; MRI liver- Worsened.  #Given the progression of disease discussed restarting chemotherapy-I would recommend FOLFIRI plus Avastin.  Patient understands treatments are palliative not curative.  Again reviewed the potential side effects including but not limited to diarrhea sores in the mouth; and the potential side effects of Avastin including but not limited  to-wound healing problems nephrotic syndrome elevated blood pressure etc.  # diarrhea-unclear etiology.  2-3 loose stools/day;Recent stool testing negative for any infectious etiology.  improved with imodium; BRAT diet.    #  follow up on July 15th/labs [vacation]- FOLFIRI+ avastin.; UA; pump dc-48 hours later  # I reviewed the blood work- with the patient in detail; also reviewed the imaging independently [as summarized above]; and with the patient in detail.    # 40 minutes face-to-face with the patient discussing the above plan of care; more than 50% of time spent on prognosis/ natural history; counseling and coordination.    Orders Placed This Encounter  Procedures  . CBC with Differential/Platelet    Standing Status:   Future    Standing Expiration Date:   11/22/2018  . Comprehensive metabolic panel    Standing Status:   Future    Standing Expiration Date:   11/22/2018  . Urinalysis, Complete w Microscopic    Standing Status:   Future    Standing Expiration Date:   11/22/2018   All questions were answered. The patient knows to call the clinic with any problems, questions or concerns.      Cammie Sickle, MD 11/22/2017 1:16 PM

## 2017-11-22 NOTE — Progress Notes (Signed)
START ON PATHWAY REGIMEN - Colorectal     A cycle is every 14 days:     Irinotecan      Leucovorin      5-Fluorouracil      5-Fluorouracil      Bevacizumab   **Always confirm dose/schedule in your pharmacy ordering system**  Patient Characteristics: Metastatic Colorectal, Second Line, KRAS Mutation Positive/Unknown, BRAF Wild-Type/Unknown, Bevacizumab Eligible Current evidence of distant metastases<= Yes AJCC T Category: T3 AJCC N Category: N1 AJCC M Category: M1a AJCC 8 Stage Grouping: IVA BRAF Mutation Status: Wild Type (no mutation) KRAS/NRAS Mutation Status: Mutation Positive Line of therapy: Second Line  Intent of Therapy: Non-Curative / Palliative Intent, Discussed with Patient

## 2017-11-30 ENCOUNTER — Other Ambulatory Visit: Payer: Self-pay | Admitting: Internal Medicine

## 2017-12-03 ENCOUNTER — Inpatient Hospital Stay: Payer: BLUE CROSS/BLUE SHIELD

## 2017-12-03 ENCOUNTER — Encounter: Payer: Self-pay | Admitting: Internal Medicine

## 2017-12-03 ENCOUNTER — Inpatient Hospital Stay (HOSPITAL_BASED_OUTPATIENT_CLINIC_OR_DEPARTMENT_OTHER): Payer: BLUE CROSS/BLUE SHIELD | Admitting: Internal Medicine

## 2017-12-03 VITALS — BP 98/66 | HR 77 | Temp 97.8°F | Resp 16 | Wt 106.4 lb

## 2017-12-03 VITALS — BP 95/66 | HR 91 | Temp 97.9°F | Resp 16

## 2017-12-03 DIAGNOSIS — R197 Diarrhea, unspecified: Secondary | ICD-10-CM | POA: Diagnosis not present

## 2017-12-03 DIAGNOSIS — Z87891 Personal history of nicotine dependence: Secondary | ICD-10-CM

## 2017-12-03 DIAGNOSIS — C772 Secondary and unspecified malignant neoplasm of intra-abdominal lymph nodes: Secondary | ICD-10-CM | POA: Diagnosis not present

## 2017-12-03 DIAGNOSIS — C182 Malignant neoplasm of ascending colon: Secondary | ICD-10-CM

## 2017-12-03 DIAGNOSIS — I10 Essential (primary) hypertension: Secondary | ICD-10-CM | POA: Diagnosis not present

## 2017-12-03 LAB — COMPREHENSIVE METABOLIC PANEL
ALBUMIN: 3.9 g/dL (ref 3.5–5.0)
ALK PHOS: 91 U/L (ref 38–126)
ALT: 12 U/L (ref 0–44)
AST: 19 U/L (ref 15–41)
Anion gap: 9 (ref 5–15)
BILIRUBIN TOTAL: 0.5 mg/dL (ref 0.3–1.2)
BUN: 15 mg/dL (ref 6–20)
CALCIUM: 9 mg/dL (ref 8.9–10.3)
CO2: 24 mmol/L (ref 22–32)
CREATININE: 0.81 mg/dL (ref 0.44–1.00)
Chloride: 107 mmol/L (ref 98–111)
GFR calc non Af Amer: >60 mL/min (ref >60)
GLUCOSE: 88 mg/dL (ref 70–99)
Potassium: 3.6 mmol/L (ref 3.5–5.1)
SODIUM: 140 mmol/L (ref 135–145)
TOTAL PROTEIN: 6.8 g/dL (ref 6.5–8.1)

## 2017-12-03 LAB — CBC WITH DIFFERENTIAL/PLATELET
BASOS ABS: 0 10*3/uL (ref 0–0.1)
BASOS PCT: 0 %
EOS ABS: 0.2 10*3/uL (ref 0–0.7)
Eosinophils Relative: 4 %
HCT: 34 % — ABNORMAL LOW (ref 35.0–47.0)
Hemoglobin: 11.5 g/dL — ABNORMAL LOW (ref 12.0–16.0)
Lymphocytes Relative: 18 %
Lymphs Abs: 1.1 10*3/uL (ref 1.0–3.6)
MCH: 30.4 pg (ref 26.0–34.0)
MCHC: 33.7 g/dL (ref 32.0–36.0)
MCV: 90.1 fL (ref 80.0–100.0)
MONO ABS: 0.5 10*3/uL (ref 0.2–0.9)
MONOS PCT: 9 %
Neutro Abs: 4.2 10*3/uL (ref 1.4–6.5)
Neutrophils Relative %: 69 %
PLATELETS: 159 10*3/uL (ref 150–440)
RBC: 3.78 MIL/uL — ABNORMAL LOW (ref 3.80–5.20)
RDW: 15.8 % — AB (ref 11.5–14.5)
WBC: 6 10*3/uL (ref 3.6–11.0)

## 2017-12-03 LAB — URINALYSIS, COMPLETE (UACMP) WITH MICROSCOPIC
BACTERIA UA: NONE SEEN
Bilirubin Urine: NEGATIVE
Glucose, UA: NEGATIVE mg/dL
HGB URINE DIPSTICK: NEGATIVE
Ketones, ur: NEGATIVE mg/dL
Leukocytes, UA: NEGATIVE
Nitrite: NEGATIVE
PH: 5 (ref 5.0–8.0)
Protein, ur: NEGATIVE mg/dL
SPECIFIC GRAVITY, URINE: 1.014 (ref 1.005–1.030)

## 2017-12-03 MED ORDER — DEXAMETHASONE SODIUM PHOSPHATE 10 MG/ML IJ SOLN
10.0000 mg | Freq: Once | INTRAMUSCULAR | Status: AC
  Administered 2017-12-03: 10 mg via INTRAVENOUS
  Filled 2017-12-03: qty 1

## 2017-12-03 MED ORDER — SODIUM CHLORIDE 0.9 % IV SOLN
Freq: Once | INTRAVENOUS | Status: AC
  Administered 2017-12-03: 09:00:00 via INTRAVENOUS
  Filled 2017-12-03: qty 1000

## 2017-12-03 MED ORDER — ATROPINE SULFATE 1 MG/ML IJ SOLN
0.5000 mg | Freq: Once | INTRAMUSCULAR | Status: AC | PRN
  Administered 2017-12-03: 0.5 mg via INTRAVENOUS
  Filled 2017-12-03: qty 1

## 2017-12-03 MED ORDER — SODIUM CHLORIDE 0.9% FLUSH
10.0000 mL | INTRAVENOUS | Status: DC | PRN
  Administered 2017-12-03: 10 mL via INTRAVENOUS
  Filled 2017-12-03: qty 10

## 2017-12-03 MED ORDER — SODIUM CHLORIDE 0.9 % IV SOLN: 10.0000 mg | Freq: Once | INTRAVENOUS | Status: DC

## 2017-12-03 MED ORDER — SODIUM CHLORIDE 0.9 % IV SOLN
2400.0000 mg/m2 | INTRAVENOUS | Status: DC
  Administered 2017-12-03: 3500 mg via INTRAVENOUS
  Filled 2017-12-03: qty 50

## 2017-12-03 MED ORDER — PALONOSETRON HCL INJECTION 0.25 MG/5ML
0.2500 mg | Freq: Once | INTRAVENOUS | Status: AC
  Administered 2017-12-03: 0.25 mg via INTRAVENOUS
  Filled 2017-12-03: qty 5

## 2017-12-03 MED ORDER — IRINOTECAN HCL CHEMO INJECTION 100 MG/5ML
150.0000 mg/m2 | Freq: Once | INTRAVENOUS | Status: AC
  Administered 2017-12-03: 220 mg via INTRAVENOUS
  Filled 2017-12-03: qty 10

## 2017-12-03 MED ORDER — SODIUM CHLORIDE 0.9 % IV SOLN
5.0000 mg/kg | Freq: Once | INTRAVENOUS | Status: AC
  Administered 2017-12-03: 250 mg via INTRAVENOUS
  Filled 2017-12-03: qty 10

## 2017-12-03 NOTE — Progress Notes (Signed)
[place] OFFICE PROGRESS NOTE  Patient Care Team: Coral Spikes, DO as PCP - General (Family Medicine)  Cancer Staging No matching staging information was found for the patient.   Oncology History   # [facility] 2017- COLON CANCER- STAGE IV [s/p right hemi-colectomy; pT4apN2 (7/19LN)]; Pre-op CEA- [id number]  PET-  mesenteric adenopathy/mediastinal adenopathy/right-sided subclavicular lymph node.    April 17th -START FOLFOX; May 1st Add Avastin;   Aug 16th- Disc OX [sec PN]; con 5FU-Avastin; NOV 27th CT C/A/P- CR; except for 56m LUL; 5FU-Avastin q 3 W [Poor tolerance to FOLFOX].  # June/July 2019- Progression of Liver/Abd LN; July 2019- START FOLFIRI + Avastin  # March 2018- L2 uptake MET s/p RT [[facility]2018]; mid April X-geva   # MOLECULAR TESTING: K-ras-EXON 2- MUTATED; MSI- STABLE.   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  female pleasant patient above history of metastatic colon cancer is here to restart chemotherapy; after chemo holiday.  Patient appetite is fair.  Continues to have intermittent diarrhea.  No nausea no vomiting.  No significant weight loss.  No worsening pain.  Review of Systems  Constitutional: Negative for chills, diaphoresis, fever, malaise/fatigue and weight loss.  HENT: Negative for nosebleeds and sore throat.   Eyes: Negative for double vision.  Respiratory: Negative for cough, hemoptysis, sputum production, shortness of breath and wheezing.   Cardiovascular: Negative for chest pain, palpitations, orthopnea and leg swelling.  Gastrointestinal: Positive for diarrhea. Negative for abdominal pain, blood in stool, constipation, heartburn, melena, nausea and vomiting.  Genitourinary: Negative for dysuria, frequency and urgency.  Musculoskeletal:  Negative for back pain and joint pain.  Skin: Negative.  Negative for itching and rash.  Neurological: Negative for dizziness, tingling, focal weakness, weakness and headaches.  Endo/Heme/Allergies: Does not bruise/bleed easily.  Psychiatric/Behavioral: Negative for depression. The patient is not nervous/anxious and does not have insomnia.       PAST MEDICAL HISTORY :  Past Medical History:  Diagnosis Date  . Chicken pox   . Colon cancer ([place]    Partial colectomy 07/2015 + chemo tx's  . Hypertension   . Hypokalemia   . Menopause    > 5 yrs  . Peripheral neuropathy due to chemotherapy ([facility]     PAST SURGICAL HISTORY :   Past Surgical History:  Procedure Laterality Date  . BREAST BIOPSY Right    2007? pt unsure done by [facility]  . COLONOSCOPY    . COLONOSCOPY WITH PROPOFOL N/A 10/14/2015   Procedure: COLONOSCOPY WITH PROPOFOL;  Surgeon: PHulen Luster MD;  [facility];  Service: Endoscopy;  Laterality: N/A;  . COLOSTOMY REVISION Right 08/09/2015   Procedure: COLON RESECTION RIGHT;  Surgeon: RFlorene Glen MD;  Location: [facility] ORS;  Service: General;  Laterality: Right;  . ECTOPIC PREGNANCY SURGERY    . PORTACATH PLACEMENT N/A 09/01/2015   Procedure: INSERTION PORT-A-CATH;  Surgeon: RFlorene Glen MD;  Location: [facility] ORS;  Service: General;  Laterality: N/A;  . SHOULDER SURGERY Left     FAMILY HISTORY :   Family History  Problem Relation Age of Onset  . Hypertension Mother   . Arthritis Father   . Prostate cancer Father   . Diabetes Maternal Grandmother   . Colon cancer  Maternal Grandfather        colon    SOCIAL HISTORY:   Social History   Tobacco Use  . Smoking status: Former Smoker    Packs/day: 0.25    Years: 2.00    Pack years: 0.50  . Smokeless tobacco: Never Used  . Tobacco comment: recently quit  Substance Use Topics  . Alcohol use: No    Alcohol/week: 0.0 oz  . Drug use: No    ALLERGIES:  has No Known Allergies.  MEDICATIONS:  Current  Outpatient Medications  Medication Sig Dispense Refill  . acetaminophen (TYLENOL) 325 MG tablet Take 2 tablets (650 mg total) by mouth every 6 (six) hours as needed for mild pain (or Fever >/= 101).    . benazepril (LOTENSIN) 20 MG tablet TAKE 1 TABLET DAILY 90 tablet 1  . diphenoxylate-atropine (LOMOTIL) 2.5-0.025 MG tablet Take 1 tablet by mouth 4 (four) times daily as needed for diarrhea or loose stools. Take it along with immodium 40 tablet 4  . diphenoxylate-atropine (LOMOTIL) 2.5-0.025 MG tablet Take 1 tablet by mouth 4 (four) times daily as needed for diarrhea or loose stools. 30 tablet 0  . DULoxetine (CYMBALTA) 20 MG capsule Take 2 capsules (40 mg total) by mouth daily. 180 capsule 0  . feeding supplement (BOOST HIGH PROTEIN) LIQD Take 1 Container by mouth daily.    . ferrous sulfate (FERROUSUL) 325 (65 FE) MG tablet Take 1 tablet (325 mg total) by mouth daily with breakfast. 90 tablet 3  . gabapentin (NEURONTIN) 300 MG capsule One pill in am; and 2 pills at night prior to sleep. 90 capsule 3  . hydrochlorothiazide (MICROZIDE) 12.5 MG capsule Take 1 capsule (12.5 mg total) by mouth daily. 30 capsule 6  . HYDROcodone-acetaminophen (NORCO/VICODIN) 5-325 MG tablet Take 1 tablet by mouth every 12 (twelve) hours as needed for moderate pain. 60 tablet 0  . lidocaine (LIDODERM) 5 % Place 1 patch onto the skin daily. Remove & Discard patch within 12 hours or as directed by MD 30 patch 0  . lidocaine-prilocaine (EMLA) cream Apply 1 application topically as needed. Apply generously over the Mediport 45 minutes prior to chemotherapy. 30 g 6  . loperamide (IMODIUM) 2 MG capsule Take 2 mg by mouth as needed for diarrhea or loose stools.    . Multiple Vitamins-Minerals (MULTIVITAMIN ADULT PO) Take 1 tablet by mouth daily.    . ondansetron (ZOFRAN) 8 MG tablet Take 1 tablet (8 mg total) by mouth every 8 (eight) hours as needed for nausea or vomiting (start 3 days; after chemo). 40 tablet 0  . potassium  chloride (K-DUR) 10 MEQ tablet Take 6 tablets (60 mEq total) by mouth daily. 540 tablet 0  . prochlorperazine (COMPAZINE) 10 MG tablet Take 1 tablet (10 mg total) by mouth every 6 (six) hours as needed for nausea or vomiting. 90 tablet 1   No current facility-administered medications for this visit.    Facility-Administered Medications Ordered in Other Visits  Medication Dose Route Frequency Provider Last Rate Last Dose  . sodium chloride flush (NS) 0.9 % injection 10 mL  10 mL Intravenous PRN Cammie Sickle, MD   10 mL at 10/04/15 0901  . sodium chloride flush (NS) 0.9 % injection 10 mL  10 mL Intravenous PRN Cammie Sickle, MD   10 mL at 07/02/17 1020    PHYSICAL EXAMINATION: ECOG PERFORMANCE STATUS: 1 - Symptomatic but completely ambulatory  BP 98/66 (BP Location: Right Arm, Patient Position: Sitting)  Pulse 77   Temp 97.8 F (36.6 C) (Tympanic)   Resp 16   Wt 106 lb 6.4 oz (48.3 kg)   LMP  (LMP Unknown) Comment: LMP MORE THAN 5 YRS  BMI 20.78 kg/m   Filed Weights   12/03/17 0835  Weight: 106 lb 6.4 oz (48.3 kg)    GENERAL: Well-nourished well-developed; Alert, no distress and comfortable.  Accompanied by her mother. EYES: no pallor or icterus OROPHARYNX: no thrush or ulceration; NECK: supple; no lymph nodes felt. LYMPH:  no palpable lymphadenopathy in the axillary or inguinal regions LUNGS: Decreased breath sounds auscultation bilaterally. No wheeze or crackles HEART/CVS: regular rate & rhythm and no murmurs; No lower extremity edema ABDOMEN:abdomen soft, non-tender and normal bowel sounds. No hepatomegaly or splenomegaly.  Musculoskeletal:no cyanosis of digits and no clubbing  PSYCH: alert & oriented x 3 with fluent speech NEURO: no focal motor/sensory deficits SKIN:  no rashes or significant lesions    LABORATORY DATA:  I have reviewed the data as listed    Component Value Date/Time   NA 140 12/03/2017 0804   K 3.6 12/03/2017 0804   CL 107  12/03/2017 0804   CO2 24 12/03/2017 0804   GLUCOSE 88 12/03/2017 0804   BUN 15 12/03/2017 0804   CREATININE 0.81 12/03/2017 0804   CALCIUM 9.0 12/03/2017 0804   PROT 6.8 12/03/2017 0804   ALBUMIN 3.9 12/03/2017 0804   AST 19 12/03/2017 0804   ALT 12 12/03/2017 0804   ALKPHOS 91 12/03/2017 0804   BILITOT 0.5 12/03/2017 0804   GFRNONAA >60 12/03/2017 0804   GFRAA >60 12/03/2017 0804    No results found for: SPEP, UPEP  Lab Results  Component Value Date   WBC 6.0 12/03/2017   NEUTROABS 4.2 12/03/2017   HGB 11.5 (L) 12/03/2017   HCT 34.0 (L) 12/03/2017   MCV 90.1 12/03/2017   PLT 159 12/03/2017      Chemistry      Component Value Date/Time   NA 140 12/03/2017 0804   K 3.6 12/03/2017 0804   CL 107 12/03/2017 0804   CO2 24 12/03/2017 0804   BUN 15 12/03/2017 0804   CREATININE 0.81 12/03/2017 0804      Component Value Date/Time   CALCIUM 9.0 12/03/2017 0804   ALKPHOS 91 12/03/2017 0804   AST 19 12/03/2017 0804   ALT 12 12/03/2017 0804   BILITOT 0.5 12/03/2017 0804       RADIOGRAPHIC STUDIES: I have personally reviewed the radiological images as listed and agreed with the findings in the report. No results found.   ASSESSMENT & PLAN:  Cancer of ascending colon metastatic to intra-abdominal lymph node ([place]) # Colon cancer- cecal/ right-sided; STAGE IV currently on hold/chemo holiday.  CT scan June 2019 shows worsening liver mets; intra-abdominal metastases; question worsening L2 bone lesion- sclerosis; MRI liver- Worsened.  # proceed with FOLFIRI + Avastin [discontinue bolus 5FU/LV]; again reviewed the side effects.  Again understands treatments are palliative not curative.  # diarrhea-unclear etiology.  2-3 loose stools/day; stable; recommend imodium/lomotil.   #Follow-up in 2 weeks; FOLFIRI plus Avastin; UA labs.   Orders Placed This Encounter  Procedures  . Comprehensive metabolic panel    Standing Status:   Future    Standing Expiration Date:   12/04/2018   . CBC with Differential    Standing Status:   Future    Standing Expiration Date:   12/04/2018  . Urinalysis, Complete w Microscopic    Standing Status:   Future  Standing Expiration Date:   12/04/2018   All questions were answered. The patient knows to call the clinic with any problems, questions or concerns.      Cammie Sickle, MD 12/04/2017 8:00 PM

## 2017-12-03 NOTE — Assessment & Plan Note (Addendum)
#   Colon cancer- cecal/ right-sided; STAGE IV currently on hold/chemo holiday.  CT scan June 2019 shows worsening liver mets; intra-abdominal metastases; question worsening L2 bone lesion- sclerosis; MRI liver- Worsened.  # proceed with FOLFIRI + Avastin [discontinue bolus 5FU/LV]; again reviewed the side effects.  Again understands treatments are palliative not curative.  # diarrhea-unclear etiology.  2-3 loose stools/day; stable; recommend imodium/lomotil.   #Follow-up in 2 weeks; FOLFIRI plus Avastin; UA labs.

## 2017-12-03 NOTE — Progress Notes (Signed)
No leucovorin per MD.  

## 2017-12-03 NOTE — Progress Notes (Signed)
Per Dr. Rogue Bussing no Leucovorin today. Pt tolerated infusion well. Pt stable at discharge.

## 2017-12-05 ENCOUNTER — Inpatient Hospital Stay (HOSPITAL_BASED_OUTPATIENT_CLINIC_OR_DEPARTMENT_OTHER): Payer: BLUE CROSS/BLUE SHIELD | Admitting: Oncology

## 2017-12-05 ENCOUNTER — Inpatient Hospital Stay: Payer: BLUE CROSS/BLUE SHIELD

## 2017-12-05 VITALS — BP 108/70 | HR 74 | Temp 96.9°F | Resp 18

## 2017-12-05 DIAGNOSIS — E860 Dehydration: Secondary | ICD-10-CM

## 2017-12-05 DIAGNOSIS — C182 Malignant neoplasm of ascending colon: Secondary | ICD-10-CM | POA: Diagnosis not present

## 2017-12-05 DIAGNOSIS — C7951 Secondary malignant neoplasm of bone: Secondary | ICD-10-CM

## 2017-12-05 DIAGNOSIS — Z87891 Personal history of nicotine dependence: Secondary | ICD-10-CM

## 2017-12-05 DIAGNOSIS — R110 Nausea: Secondary | ICD-10-CM

## 2017-12-05 DIAGNOSIS — C772 Secondary and unspecified malignant neoplasm of intra-abdominal lymph nodes: Secondary | ICD-10-CM

## 2017-12-05 DIAGNOSIS — I10 Essential (primary) hypertension: Secondary | ICD-10-CM

## 2017-12-05 MED ORDER — SODIUM CHLORIDE 0.9 % IV SOLN
INTRAVENOUS | Status: DC
  Administered 2017-12-05: 14:00:00 via INTRAVENOUS
  Filled 2017-12-05 (×2): qty 1000

## 2017-12-05 MED ORDER — SODIUM CHLORIDE 0.9% FLUSH
10.0000 mL | INTRAVENOUS | Status: DC | PRN
  Administered 2017-12-05: 10 mL
  Filled 2017-12-05: qty 10

## 2017-12-05 MED ORDER — HEPARIN SOD (PORK) LOCK FLUSH 100 UNIT/ML IV SOLN
500.0000 [IU] | Freq: Once | INTRAVENOUS | Status: AC | PRN
  Administered 2017-12-05: 500 [IU]
  Filled 2017-12-05: qty 5

## 2017-12-05 MED ORDER — ONDANSETRON HCL 4 MG/2ML IJ SOLN
8.0000 mg | Freq: Once | INTRAMUSCULAR | Status: AC
  Administered 2017-12-05: 8 mg via INTRAVENOUS
  Filled 2017-12-05: qty 4

## 2017-12-05 NOTE — Progress Notes (Signed)
Dr. Rogue Bussing made aware of patient complaining of nausea, vomiting, and diarrhea. Ordered to give 1 L NS bolus and Zofran.

## 2017-12-05 NOTE — Progress Notes (Signed)
Symptom Management Consult note [facility]  [phone] Fax:([phone]  Patient Care Team: Coral Spikes, DO as PCP - General (Family Medicine)   Name of the patient: Nancy Clark  [id number]  Apr 27, 1961   Date of visit: 12/05/17  Diagnosis-stage IV colon cancer  Chief complaint/ Reason for visit- nausea/vomiting/diarrhea  Heme/Onc history: Patient was last seen by Dr. Rogue Bussing primary medical oncologist on 12/03/2017 prior to restarting chemotherapy after chemo holiday.  She admitted to a fair appetite and intermittent diarrhea.  They proceeded with FOLFIRI plus Avastin after reviewing side effects and reviewing lab work.  She is scheduled to return to clinic in 2 weeks for cycle 2 FOLFIRI plus Avastin.  Oncology History   # [facility] 2017- COLON CANCER- STAGE IV [s/p right hemi-colectomy; pT4apN2 (7/19LN)]; Pre-op CEA- [id number]  PET-  mesenteric adenopathy/mediastinal adenopathy/right-sided subclavicular lymph node.    April 17th -START FOLFOX; May 1st Add Avastin;   Aug 16th- Disc OX [sec PN]; con 5FU-Avastin; NOV 27th CT C/A/P- CR; except for 93m LUL; 5FU-Avastin q 3 W [Poor tolerance to FOLFOX].  # June/July 2019- Progression of Liver/Abd LN; July 2019- START FOLFIRI + Avastin  # March 2018- L2 uptake MET s/p RT [[facility]2018]; mid April X-geva   # MOLECULAR TESTING: K-ras-EXON 2- MUTATED; MSI- STABLE.   DIAGNOSIS: colon ca  STAGE:  IV       ;GOALS: pallaitive  CURRENT/MOST RECENT THERAPY: FOLFIRI+Avastin      Cancer of ascending colon metastatic to intra-abdominal lymph node (HCC)   Interval history- MHELANE BRICENOreports diarrhea.  Onset of diarrhea was yesterday.  Diarrhea is occurring approximately 5 times per day.  Patient describes diarrhea as watery.  The symptoms are gradually worsening.  Patient estimates stool volume to be less than 1/2 cup per bowel movement.  Diarrhea does occur at night.  The patient  has noted no bleeding associated with bowel movements.  Characteristics of diarrhea: watery Associated symptoms: melena and nausea and vomiting and weight loss of 3 lbs since Monday.   Denies: abdominal cramping relieved by defecation, abdominal distension, fever and joint aches and/or The patient currently denies significant abdominal pain or discomfort. Patient also denies blood in stool, fever, significant abdominal pain.   Relationship to food: the patient denies any relationship to food.  Relationship to medications: the patient denies any relationship to medications.  Other risk factors: immunocompromise, chemotherapy. .  Therapy tried so far: OTC antidiarrheal: immodium, results poor.  Previous visits for diarrhea: With previous FOLFOX treatment in 2018.  Evaluation to date: none.  Work up so far: none. Treatment to date: None  ECOG FS:1 - Symptomatic but completely ambulatory  Review of systems- Review of Systems  Constitutional: Positive for malaise/fatigue and weight loss. Negative for chills and fever.  HENT: Negative for congestion and ear pain.   Eyes: Negative.  Negative for blurred vision and double vision.  Respiratory: Negative.  Negative for cough, sputum production and shortness of breath.   Cardiovascular: Negative.  Negative for chest pain, palpitations and leg swelling.  Gastrointestinal: Positive for diarrhea, nausea and vomiting. Negative for abdominal pain and constipation.  Genitourinary: Negative for dysuria, frequency and urgency.  Musculoskeletal: Negative for back pain and falls.  Skin: Negative.  Negative for rash.  Neurological: Positive for weakness. Negative for headaches.  Endo/Heme/Allergies: Negative.  Does not bruise/bleed easily.  Psychiatric/Behavioral: Negative.  Negative for depression. The patient is not nervous/anxious and does not have insomnia.  Current treatment- S/p cycle 1 day  No Known Allergies   Past Medical History:    Diagnosis Date  . Chicken pox   . Colon cancer ([place])    Partial colectomy 07/2015 + chemo tx's  . Hypertension   . Hypokalemia   . Menopause    > 5 yrs  . Peripheral neuropathy due to chemotherapy [facility])      Past Surgical History:  Procedure Laterality Date  . BREAST BIOPSY Right    2007? pt unsure done by [facility]  . COLONOSCOPY    . COLONOSCOPY WITH PROPOFOL N/A 10/14/2015   Procedure: COLONOSCOPY WITH PROPOFOL;  Surgeon: Hulen Luster, MD;  Location: [facility] ENDOSCOPY;  Service: Endoscopy;  Laterality: N/A;  . COLOSTOMY REVISION Right 08/09/2015   Procedure: COLON RESECTION RIGHT;  Surgeon: Florene Glen, MD;  Location: [facility] ORS;  Service: General;  Laterality: Right;  . ECTOPIC PREGNANCY SURGERY    . PORTACATH PLACEMENT N/A 09/01/2015   Procedure: INSERTION PORT-A-CATH;  Surgeon: Florene Glen, MD;  Location: [facility] ORS;  Service: General;  Laterality: N/A;  . SHOULDER SURGERY Left     Social History   Socioeconomic History  . Marital status: Married    Spouse name: Not on file  . Number of children: Not on file  . Years of education: Not on file  . Highest education level: Not on file  Occupational History  . Not on file  Social Needs  . Financial resource strain: Not on file  . Food insecurity:    Worry: Not on file    Inability: Not on file  . Transportation needs:    Medical: Not on file    Non-medical: Not on file  Tobacco Use  . Smoking status: Former Smoker    Packs/day: 0.25    Years: 2.00    Pack years: 0.50  . Smokeless tobacco: Never Used  . Tobacco comment: recently quit  Substance and Sexual Activity  . Alcohol use: No    Alcohol/week: 0.0 oz  . Drug use: No  . Sexual activity: Yes  Lifestyle  . Physical activity:    Days per week: Not on file    Minutes per session: Not on file  . Stress: Not on file  Relationships  . Social connections:    Talks on phone: Not on file    Gets together: Not on file    Attends religious service: Not on file     Active member of club or organization: Not on file    Attends meetings of clubs or organizations: Not on file    Relationship status: Not on file  . Intimate partner violence:    Fear of current or ex partner: Not on file    Emotionally abused: Not on file    Physically abused: Not on file    Forced sexual activity: Not on file  Other Topics Concern  . Not on file  Social History Narrative  . Not on file    Family History  Problem Relation Age of Onset  . Hypertension Mother   . Arthritis Father   . Prostate cancer Father   . Diabetes Maternal Grandmother   . Colon cancer Maternal Grandfather        colon     Current Outpatient Medications:  .  acetaminophen (TYLENOL) 325 MG tablet, Take 2 tablets (650 mg total) by mouth every 6 (six) hours as needed for mild pain (or Fever >/= 101)., Disp: , Rfl:  .  benazepril (LOTENSIN) 20 MG tablet, TAKE 1 TABLET DAILY, Disp: 90 tablet, Rfl: 1 .  diphenoxylate-atropine (LOMOTIL) 2.5-0.025 MG tablet, Take 1 tablet by mouth 4 (four) times daily as needed for diarrhea or loose stools. Take it along with immodium, Disp: 40 tablet, Rfl: 4 .  diphenoxylate-atropine (LOMOTIL) 2.5-0.025 MG tablet, Take 1 tablet by mouth 4 (four) times daily as needed for diarrhea or loose stools., Disp: 30 tablet, Rfl: 0 .  DULoxetine (CYMBALTA) 20 MG capsule, TAKE 2 CAPSULES DAILY, Disp: 180 capsule, Rfl: 0 .  feeding supplement (BOOST HIGH PROTEIN) LIQD, Take 1 Container by mouth daily., Disp: , Rfl:  .  ferrous sulfate (FERROUSUL) 325 (65 FE) MG tablet, Take 1 tablet (325 mg total) by mouth daily with breakfast., Disp: 90 tablet, Rfl: 3 .  gabapentin (NEURONTIN) 300 MG capsule, One pill in am; and 2 pills at night prior to sleep., Disp: 90 capsule, Rfl: 3 .  hydrochlorothiazide (MICROZIDE) 12.5 MG capsule, TAKE 1 CAPSULE DAILY, Disp: 30 capsule, Rfl: 6 .  HYDROcodone-acetaminophen (NORCO/VICODIN) 5-325 MG tablet, Take 1 tablet by mouth every 12 (twelve) hours as  needed for moderate pain., Disp: 60 tablet, Rfl: 0 .  lidocaine (LIDODERM) 5 %, Place 1 patch onto the skin daily. Remove & Discard patch within 12 hours or as directed by MD, Disp: 30 patch, Rfl: 0 .  lidocaine-prilocaine (EMLA) cream, Apply 1 application topically as needed. Apply generously over the Mediport 45 minutes prior to chemotherapy., Disp: 30 g, Rfl: 6 .  loperamide (IMODIUM) 2 MG capsule, Take 2 mg by mouth as needed for diarrhea or loose stools., Disp: , Rfl:  .  Multiple Vitamins-Minerals (MULTIVITAMIN ADULT PO), Take 1 tablet by mouth daily., Disp: , Rfl:  .  ondansetron (ZOFRAN) 8 MG tablet, Take 1 tablet (8 mg total) by mouth every 8 (eight) hours as needed for nausea or vomiting (start 3 days; after chemo)., Disp: 40 tablet, Rfl: 0 .  potassium chloride (K-DUR) 10 MEQ tablet, Take 6 tablets (60 mEq total) by mouth daily., Disp: 540 tablet, Rfl: 0 .  prochlorperazine (COMPAZINE) 10 MG tablet, Take 1 tablet (10 mg total) by mouth every 6 (six) hours as needed for nausea or vomiting., Disp: 90 tablet, Rfl: 1 No current facility-administered medications for this visit.   Facility-Administered Medications Ordered in Other Visits:  .  sodium chloride flush (NS) 0.9 % injection 10 mL, 10 mL, Intravenous, PRN, Cammie Sickle, MD, 10 mL at 10/04/15 0901 .  sodium chloride flush (NS) 0.9 % injection 10 mL, 10 mL, Intravenous, PRN, Cammie Sickle, MD, 10 mL at 07/02/17 1020  Physical exam: There were no vitals filed for this visit. Physical Exam  Constitutional: She is oriented to person, place, and time. Vital signs are normal. She appears well-developed and well-nourished.  HENT:  Head: Normocephalic and atraumatic.  Eyes: Pupils are equal, round, and reactive to light.  Neck: Normal range of motion.  Cardiovascular: Normal rate, regular rhythm and normal heart sounds.  No murmur heard. Pulmonary/Chest: Effort normal and breath sounds normal. She has no wheezes.    Abdominal: Soft. Normal appearance and bowel sounds are normal. She exhibits no distension. There is no tenderness.  Musculoskeletal: Normal range of motion. She exhibits no edema.  Neurological: She is alert and oriented to person, place, and time.  Skin: Skin is warm and dry. No rash noted. There is pallor.  Psychiatric: Judgment normal.     CMP Latest Ref Rng & Units 12/07/2017  Glucose 70 - 99 mg/dL 144(H)  BUN 6 - 20 mg/dL 11  Creatinine 0.44 - 1.00 mg/dL 0.95  Sodium 135 - 145 mmol/L 138  Potassium 3.5 - 5.1 mmol/L 3.5  Chloride 98 - 111 mmol/L 100  CO2 22 - 32 mmol/L 25  Calcium 8.9 - 10.3 mg/dL 9.2  Total Protein 6.5 - 8.1 g/dL 7.1  Total Bilirubin 0.3 - 1.2 mg/dL 0.6  Alkaline Phos 38 - 126 U/L 86  AST 15 - 41 U/L 20  ALT 0 - 44 U/L 12   CBC Latest Ref Rng & Units 12/07/2017  WBC 3.6 - 11.0 K/uL 6.3  Hemoglobin 12.0 - 16.0 g/dL 12.7  Hematocrit 35.0 - 47.0 % 38.0  Platelets 150 - 440 K/uL 182    No images are attached to the encounter.  Mr Liver W Wo Contrast  Result Date: 11/16/2017 CLINICAL DATA:  History of colon cancer. New hypodense lesion in the right hepatic lobe on recent CT of 11/02/2017, for further characterization. EXAM: MRI ABDOMEN WITHOUT AND WITH CONTRAST TECHNIQUE: Multiplanar multisequence MR imaging of the abdomen was performed both before and after the administration of intravenous contrast. CONTRAST:  31m MULTIHANCE GADOBENATE DIMEGLUMINE 529 MG/ML IV SOLN COMPARISON:  Multiple exams, including 11/02/2017 FINDINGS: Lower chest: Unremarkable Hepatobiliary: The lesion of concern in the right hepatic lobe measures 2.2 by 1.7 cm on image 20/6 and demonstrates low T1 signal characteristics and faintly accentuated T2 signal characteristics. The appearance does not have reassuring benign characteristics and, being new, is suspicious for metastatic lesion. Several additional scattered nonenhancing lesions in the liver are probably cysts. Indeterminate 4 mm  lesion in the lateral segment left hepatic lobe on image 25/11. Gallbladder unremarkable.  No biliary dilatation. Pancreas:  Unremarkable Spleen:  Unremarkable Adrenals/Urinary Tract:  Unremarkable Stomach/Bowel: Unremarkable Vascular/Lymphatic: A 1.3 cm lymph node in between the celiac trunk and IVC on image 10/3 appears to be progressive compared to prior exams such as 07/28/2016, and given the findings in the liver raises concern for metastatic disease. Aortoiliac atherosclerotic vascular disease. Other:  No supplemental non-categorized findings. Musculoskeletal: Progressive abnormal enhancement in the L2 vertebra eccentric to the left, compatible with osteonecrosis metastatic disease. There is some mild left para spinal enhancement in this vicinity for example on image 39/13, involving the medial aspect of the psoas muscle. IMPRESSION: 1. MRI confirms a 2.2 by 1.7 cm rim enhancing lesion in the right hepatic lobe of high suspicion for metastatic disease. In addition, there is a mildly enlarged lymph node between the celiac trunk and IVC in the upper abdomen suspicious for metastatic disease. There is a questionable 4 mm lesion in the lateral segment left hepatic lobe which is indeterminate on image 25/11. 2. Enlargement of the region of enhancement in the L2 vertebral body eccentric to the left, with some faint paravertebral enhancement as well, compatible with either unusual osseous metastatic disease or osteonecrosis (query focused radiation therapy in this vicinity). 3. Several small hepatic cysts are present. Electronically Signed   By: WVan ClinesM.D.   On: 11/16/2017 08:13     Assessment and plan- Patient is a 57y.o. female who presents for discontinuation of pump for recent cycle 1 of FOLFIRI plus Avastin.  While in infusion, she complained of diarrhea, nausea and vomiting that was unrelieved with medications.  1.  Stage IV colon cancer: Patient previously received FOLFOX on 09/06/2015 with  the addition of Avastin on 09/20/2015.  Received a total 31 cycles completing in December 2018.  Had chemo holiday until most recent scans indicating progression of disease.  S/p cycle 1 of FOLFIRI plus Avastin on 12/03/2016.  Scheduled to return to clinic today for removal of pump and was noted to to be dehydrated d/t extreme nausea/vomiting and diarrhea. Using antiemetics/antidiarrheals as prescribed.  She is scheduled to return to clinic to see Dr. Rogue Bussing prior to cycle 2 of FOLFIRI plus Avastin on 12/17/2017.  2.  Diarrhea/nausea/vomiting: likely d/t chemo. No labs drawn today. Vital signs stable. Afebrile. Here for pump removal.  She was given 1 L NaCl  and 8 mg Zofran while in clinic.  She is instructed to take her Zofran every 8 hours around-the-clock for the next several days or until symptoms resolved.  Encouraged to use Imodium for diarrhea. If diarrhea persists will get stool sample. Patient was encouraged to return to clinic on Friday for labs and possible additional fluids if symptoms continue.  Patient in agreement with plan.   Visit Diagnosis 1. Nausea without vomiting   2. Dehydration   3. Cancer of ascending colon metastatic to intra-abdominal lymph node [facility])     Patient expressed understanding and was in agreement with this plan. She also understands that She can call clinic at any time with any questions, concerns, or complaints.   Greater than 50% was spent in counseling and coordination of care with this patient including but not limited to discussion of the relevant topics above (See A&P) including, but not limited to diagnosis and management of acute and chronic medical conditions.    Faythe Casa, AGNP-C [facility] [place]- [phone] Pager- [phone] 12/11/2017 3:01 PM

## 2017-12-06 ENCOUNTER — Other Ambulatory Visit: Payer: Self-pay | Admitting: *Deleted

## 2017-12-06 DIAGNOSIS — C182 Malignant neoplasm of ascending colon: Secondary | ICD-10-CM

## 2017-12-06 DIAGNOSIS — C772 Secondary and unspecified malignant neoplasm of intra-abdominal lymph nodes: Principal | ICD-10-CM

## 2017-12-07 ENCOUNTER — Inpatient Hospital Stay (HOSPITAL_BASED_OUTPATIENT_CLINIC_OR_DEPARTMENT_OTHER): Payer: BLUE CROSS/BLUE SHIELD | Admitting: Oncology

## 2017-12-07 ENCOUNTER — Inpatient Hospital Stay: Payer: BLUE CROSS/BLUE SHIELD

## 2017-12-07 ENCOUNTER — Encounter: Payer: Self-pay | Admitting: Oncology

## 2017-12-07 VITALS — BP 106/73 | HR 87 | Temp 97.3°F | Resp 16 | Wt 104.0 lb

## 2017-12-07 DIAGNOSIS — E860 Dehydration: Secondary | ICD-10-CM | POA: Diagnosis not present

## 2017-12-07 DIAGNOSIS — C182 Malignant neoplasm of ascending colon: Secondary | ICD-10-CM

## 2017-12-07 DIAGNOSIS — G620 Drug-induced polyneuropathy: Secondary | ICD-10-CM

## 2017-12-07 DIAGNOSIS — I10 Essential (primary) hypertension: Secondary | ICD-10-CM

## 2017-12-07 DIAGNOSIS — C772 Secondary and unspecified malignant neoplasm of intra-abdominal lymph nodes: Secondary | ICD-10-CM

## 2017-12-07 DIAGNOSIS — Z87891 Personal history of nicotine dependence: Secondary | ICD-10-CM

## 2017-12-07 DIAGNOSIS — R197 Diarrhea, unspecified: Secondary | ICD-10-CM

## 2017-12-07 DIAGNOSIS — R110 Nausea: Secondary | ICD-10-CM

## 2017-12-07 LAB — CBC WITH DIFFERENTIAL/PLATELET
Basophils Absolute: 0 10*3/uL (ref 0–0.1)
Basophils Relative: 1 %
EOS PCT: 2 %
Eosinophils Absolute: 0.1 10*3/uL (ref 0–0.7)
HEMATOCRIT: 38 % (ref 35.0–47.0)
Hemoglobin: 12.7 g/dL (ref 12.0–16.0)
LYMPHS ABS: 1.1 10*3/uL (ref 1.0–3.6)
LYMPHS PCT: 18 %
MCH: 30 pg (ref 26.0–34.0)
MCHC: 33.3 g/dL (ref 32.0–36.0)
MCV: 90.2 fL (ref 80.0–100.0)
MONOS PCT: 4 %
Monocytes Absolute: 0.3 10*3/uL (ref 0.2–0.9)
NEUTROS ABS: 4.8 10*3/uL (ref 1.4–6.5)
Neutrophils Relative %: 75 %
Platelets: 182 10*3/uL (ref 150–440)
RBC: 4.22 MIL/uL (ref 3.80–5.20)
RDW: 15.5 % — ABNORMAL HIGH (ref 11.5–14.5)
WBC: 6.3 10*3/uL (ref 3.6–11.0)

## 2017-12-07 LAB — COMPREHENSIVE METABOLIC PANEL
ALT: 12 U/L (ref 0–44)
AST: 20 U/L (ref 15–41)
Albumin: 3.9 g/dL (ref 3.5–5.0)
Alkaline Phosphatase: 86 U/L (ref 38–126)
Anion gap: 13 (ref 5–15)
BUN: 11 mg/dL (ref 6–20)
CO2: 25 mmol/L (ref 22–32)
Calcium: 9.2 mg/dL (ref 8.9–10.3)
Chloride: 100 mmol/L (ref 98–111)
Creatinine, Ser: 0.95 mg/dL (ref 0.44–1.00)
Glucose, Bld: 144 mg/dL — ABNORMAL HIGH (ref 70–99)
Potassium: 3.5 mmol/L (ref 3.5–5.1)
Sodium: 138 mmol/L (ref 135–145)
Total Bilirubin: 0.6 mg/dL (ref 0.3–1.2)
Total Protein: 7.1 g/dL (ref 6.5–8.1)

## 2017-12-07 LAB — MAGNESIUM: Magnesium: 2.2 mg/dL (ref 1.7–2.4)

## 2017-12-07 NOTE — Progress Notes (Signed)
Patient here today to be evaluated in symptom management clinic for possible iv fluids. She states that she is feeling better today and the nausea and diarrhea that she had earlier this week has resolved.

## 2017-12-07 NOTE — Progress Notes (Signed)
Symptom Management Consult note [facility]  [phone] [phone] please  Patient Care Team: Coral Spikes, DO as PCP - General (Family Medicine)   Name of the patient: Nancy Clark  [id number]  Dec 01, 1960   Date of visit: 12/10/17  Diagnosis-stage IV colon cancer  Chief complaint/ Reason for visit-possible IV fluids  Heme/Onc history: Patient was last seen in [facility] for nausea and vomiting with intermittent diarrhea.  She was given 1 L NaCl and 8 mg Zofran for nausea.  She was instructed to take Zofran every 8 hours around-the-clock until nausea resolved.  She was also instructed to take Imodium if needed for diarrhea.  Oncology History   # [facility] 2017- COLON CANCER- STAGE IV [s/p right hemi-colectomy; pT4apN2 (7/19LN)]; Pre-op CEA- [id number]  PET-  mesenteric adenopathy/mediastinal adenopathy/right-sided subclavicular lymph node.    April 17th -START FOLFOX; May 1st Add Avastin;   Aug 16th- Disc OX [sec PN]; con 5FU-Avastin; NOV 27th CT C/A/P- CR; except for 22m LUL; 5FU-Avastin q 3 W [Poor tolerance to FOLFOX].  # June/July 2019- Progression of Liver/Abd LN; July 2019- START FOLFIRI + Avastin  # March 2018- L2 uptake MET s/p RT [[facility]2018]; mid April X-geva   # MOLECULAR TESTING: K-ras-EXON 2- MUTATED; MSI- STABLE.   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 She recently had cycle 1 FOLFIRI + Avastin on Monday and returned on Wednesday for removal of pump. At that visit, she was severely dehydrated with intractable nausea/vomiting and intermittent diarrhea. She was called on Thursday to see how she was feeling and she continued to have nausea that with slight improvement of her diarrhea. We encouraged her to call for  labs and evaluation on Friday if symptoms continued. Today she is feeling much better.  States yesterday afternoon nausea and vomiting completely resolved.  She is taking Zofran every 8 hours as instructed.  Diarrhea has resolved on its own.  She did take 2 doses of Imodium with complete resolution.   ECOG FS:1 - Symptomatic but completely ambulatory  Review of systems- Review of Systems  Constitutional: Negative.  Negative for chills, fever, malaise/fatigue and weight loss.  HENT: Negative for congestion and ear pain.   Eyes: Negative.  Negative for blurred vision and double vision.  Respiratory: Negative.  Negative for cough, sputum production and shortness of breath.   Cardiovascular: Negative.  Negative for chest pain, palpitations and leg swelling.  Gastrointestinal: Positive for diarrhea, nausea and vomiting. Negative for abdominal pain and constipation.  Genitourinary: Negative for dysuria, frequency and urgency.  Musculoskeletal: Negative for back pain and falls.  Skin: Negative.  Negative for rash.  Neurological: Negative.  Negative for weakness and headaches.  Endo/Heme/Allergies: Negative.  Does not bruise/bleed easily.  Psychiatric/Behavioral: Negative.  Negative for depression. The patient is not nervous/anxious and does not have insomnia.      Current treatment- s/p Cycle 1 Folfiri + Avastin on 12/03/17  No Known Allergies   Past Medical History:  Diagnosis Date  . Chicken pox   . [place]    Partial colectomy 07/2015 + chemo tx's  . Hypertension   . Hypokalemia   . Menopause    > 5 yrs  . Peripheral [place] due to [place]  Past Surgical History:  Procedure Laterality Date  . BREAST BIOPSY Right    2007? pt unsure done by [facility]  . COLONOSCOPY    . COLONOSCOPY WITH PROPOFOL N/A 10/14/2015   Procedure: COLONOSCOPY WITH PROPOFOL;  Surgeon: Hulen Luster, MD;  Location: [facility] ENDOSCOPY;  Service: Endoscopy;  Laterality: N/A;  . COLOSTOMY  REVISION Right 08/09/2015   Procedure: COLON RESECTION RIGHT;  Surgeon: Florene Glen, MD;  Location: [facility] ORS;  Service: General;  Laterality: Right;  . ECTOPIC PREGNANCY SURGERY    . PORTACATH PLACEMENT N/A 09/01/2015   Procedure: INSERTION PORT-A-CATH;  Surgeon: Florene Glen, MD;  Location: [facility] ORS;  Service: General;  Laterality: N/A;  . SHOULDER SURGERY Left     Social History   Socioeconomic History  . Marital status: Married    Spouse name: Not on file  . Number of children: Not on file  . Years of education: Not on file  . Highest education level: Not on file  Occupational History  . Not on file  Social Needs  . Financial resource strain: Not on file  . Food insecurity:    Worry: Not on file    Inability: Not on file  . Transportation needs:    Medical: Not on file    Non-medical: Not on file  Tobacco Use  . Smoking status: Former Smoker    Packs/day: 0.25    Years: 2.00    Pack years: 0.50  . Smokeless tobacco: Never Used  . Tobacco comment: recently quit  Substance and Sexual Activity  . Alcohol use: No    Alcohol/week: 0.0 oz  . Drug use: No  . Sexual activity: Yes  Lifestyle  . Physical activity:    Days per week: Not on file    Minutes per session: Not on file  . Stress: Not on file  Relationships  . Social connections:    Talks on phone: Not on file    Gets together: Not on file    Attends religious service: Not on file    Active member of club or organization: Not on file    Attends meetings of clubs or organizations: Not on file    Relationship status: Not on file  . Intimate partner violence:    Fear of current or ex partner: Not on file    Emotionally abused: Not on file    Physically abused: Not on file    Forced sexual activity: Not on file  Other Topics Concern  . Not on file  Social History Narrative  . Not on file    Family History  Problem Relation Age of Onset  . Hypertension Mother   . Arthritis Father   . Prostate  cancer Father   . Diabetes Maternal Grandmother   . Colon cancer Maternal Grandfather        colon     Current Outpatient Medications:  .  acetaminophen (TYLENOL) 325 MG tablet, Take 2 tablets (650 mg total) by mouth every 6 (six) hours as needed for mild pain (or Fever >/= 101)., Disp: , Rfl:  .  benazepril (LOTENSIN) 20 MG tablet, TAKE 1 TABLET DAILY, Disp: 90 tablet, Rfl: 1 .  diphenoxylate-atropine (LOMOTIL) 2.5-0.025 MG tablet, Take 1 tablet by mouth 4 (four) times daily as needed for diarrhea or loose stools. Take it along with immodium, Disp: 40 tablet, Rfl: 4 .  diphenoxylate-atropine (LOMOTIL) 2.5-0.025 MG tablet, Take 1 tablet by mouth 4 (four) times daily as needed for diarrhea or  loose stools., Disp: 30 tablet, Rfl: 0 .  DULoxetine (CYMBALTA) 20 MG capsule, Take 2 capsules (40 mg total) by mouth daily., Disp: 180 capsule, Rfl: 0 .  feeding supplement (BOOST HIGH PROTEIN) LIQD, Take 1 Container by mouth daily., Disp: , Rfl:  .  ferrous sulfate (FERROUSUL) 325 (65 FE) MG tablet, Take 1 tablet (325 mg total) by mouth daily with breakfast., Disp: 90 tablet, Rfl: 3 .  gabapentin (NEURONTIN) 300 MG capsule, One pill in am; and 2 pills at night prior to sleep., Disp: 90 capsule, Rfl: 3 .  hydrochlorothiazide (MICROZIDE) 12.5 MG capsule, Take 1 capsule (12.5 mg total) by mouth daily., Disp: 30 capsule, Rfl: 6 .  HYDROcodone-acetaminophen (NORCO/VICODIN) 5-325 MG tablet, Take 1 tablet by mouth every 12 (twelve) hours as needed for moderate pain., Disp: 60 tablet, Rfl: 0 .  lidocaine (LIDODERM) 5 %, Place 1 patch onto the skin daily. Remove & Discard patch within 12 hours or as directed by MD, Disp: 30 patch, Rfl: 0 .  lidocaine-prilocaine (EMLA) cream, Apply 1 application topically as needed. Apply generously over the Mediport 45 minutes prior to chemotherapy., Disp: 30 g, Rfl: 6 .  loperamide (IMODIUM) 2 MG capsule, Take 2 mg by mouth as needed for diarrhea or loose stools., Disp: , Rfl:  .   Multiple Vitamins-Minerals (MULTIVITAMIN ADULT PO), Take 1 tablet by mouth daily., Disp: , Rfl:  .  ondansetron (ZOFRAN) 8 MG tablet, Take 1 tablet (8 mg total) by mouth every 8 (eight) hours as needed for nausea or vomiting (start 3 days; after chemo)., Disp: 40 tablet, Rfl: 0 .  potassium chloride (K-DUR) 10 MEQ tablet, Take 6 tablets (60 mEq total) by mouth daily., Disp: 540 tablet, Rfl: 0 .  prochlorperazine (COMPAZINE) 10 MG tablet, Take 1 tablet (10 mg total) by mouth every 6 (six) hours as needed for nausea or vomiting., Disp: 90 tablet, Rfl: 1 No current facility-administered medications for this visit.   Facility-Administered Medications Ordered in Other Visits:  .  sodium chloride flush (NS) 0.9 % injection 10 mL, 10 mL, Intravenous, PRN, Cammie Sickle, MD, 10 mL at 10/04/15 0901 .  sodium chloride flush (NS) 0.9 % injection 10 mL, 10 mL, Intravenous, PRN, Cammie Sickle, MD, 10 mL at 07/02/17 1020  Physical exam:  Vitals:   12/07/17 0917  BP: 106/73  Pulse: 87  Resp: 16  Temp: (!) 97.3 F (36.3 C)  TempSrc: Tympanic  Weight: 104 lb (47.2 kg)   Physical Exam  Constitutional: She is oriented to person, place, and time. Vital signs are normal. She appears well-developed and well-nourished.  HENT:  Head: Normocephalic and atraumatic.  Eyes: Pupils are equal, round, and reactive to light.  Neck: Normal range of motion.  Cardiovascular: Normal rate, regular rhythm and normal heart sounds.  No murmur heard. Pulmonary/Chest: Effort normal and breath sounds normal. She has no wheezes.  Abdominal: Soft. Normal appearance and bowel sounds are normal. She exhibits no distension. There is no tenderness.  Musculoskeletal: Normal range of motion. She exhibits no edema.  Neurological: She is alert and oriented to person, place, and time.  Skin: Skin is warm and dry. No rash noted.  Psychiatric: Judgment normal.     CMP Latest Ref Rng & Units 12/07/2017  Glucose 70 - 99  mg/dL 144(H)  BUN 6 - 20 mg/dL 11  Creatinine 0.44 - 1.00 mg/dL 0.95  Sodium 135 - 145 mmol/L 138  Potassium 3.5 - 5.1 mmol/L 3.5  Chloride 98 -  111 mmol/L 100  CO2 22 - 32 mmol/L 25  Calcium 8.9 - 10.3 mg/dL 9.2  Total Protein 6.5 - 8.1 g/dL 7.1  Total Bilirubin 0.3 - 1.2 mg/dL 0.6  Alkaline Phos 38 - 126 U/L 86  AST 15 - 41 U/L 20  ALT 0 - 44 U/L 12   CBC Latest Ref Rng & Units 12/07/2017  WBC 3.6 - 11.0 K/uL 6.3  Hemoglobin 12.0 - 16.0 g/dL 12.7  Hematocrit 35.0 - 47.0 % 38.0  Platelets 150 - 440 K/uL 182    No images are attached to the encounter.  Mr Liver W Wo Contrast  Result Date: 11/16/2017 CLINICAL DATA:  History of colon cancer. New hypodense lesion in the right hepatic lobe on recent CT of 11/02/2017, for further characterization. EXAM: MRI ABDOMEN WITHOUT AND WITH CONTRAST TECHNIQUE: Multiplanar multisequence MR imaging of the abdomen was performed both before and after the administration of intravenous contrast. CONTRAST:  35m MULTIHANCE GADOBENATE DIMEGLUMINE 529 MG/ML IV SOLN COMPARISON:  Multiple exams, including 11/02/2017 FINDINGS: Lower chest: Unremarkable Hepatobiliary: The lesion of concern in the right hepatic lobe measures 2.2 by 1.7 cm on image 20/6 and demonstrates low T1 signal characteristics and faintly accentuated T2 signal characteristics. The appearance does not have reassuring benign characteristics and, being new, is suspicious for metastatic lesion. Several additional scattered nonenhancing lesions in the liver are probably cysts. Indeterminate 4 mm lesion in the lateral segment left hepatic lobe on image 25/11. Gallbladder unremarkable.  No biliary dilatation. Pancreas:  Unremarkable Spleen:  Unremarkable Adrenals/Urinary Tract:  Unremarkable Stomach/Bowel: Unremarkable Vascular/Lymphatic: A 1.3 cm lymph node in between the celiac trunk and IVC on image 10/3 appears to be progressive compared to prior exams such as 07/28/2016, and given the findings in the  liver raises concern for metastatic disease. Aortoiliac atherosclerotic vascular disease. Other:  No supplemental non-categorized findings. Musculoskeletal: Progressive abnormal enhancement in the L2 vertebra eccentric to the left, compatible with osteonecrosis metastatic disease. There is some mild left para spinal enhancement in this vicinity for example on image 39/13, involving the medial aspect of the psoas muscle. IMPRESSION: 1. MRI confirms a 2.2 by 1.7 cm rim enhancing lesion in the right hepatic lobe of high suspicion for metastatic disease. In addition, there is a mildly enlarged lymph node between the celiac trunk and IVC in the upper abdomen suspicious for metastatic disease. There is a questionable 4 mm lesion in the lateral segment left hepatic lobe which is indeterminate on image 25/11. 2. Enlargement of the region of enhancement in the L2 vertebral body eccentric to the left, with some faint paravertebral enhancement as well, compatible with either unusual osseous metastatic disease or osteonecrosis (query focused radiation therapy in this vicinity). 3. Several small hepatic cysts are present. Electronically Signed   By: WVan ClinesM.D.   On: 11/16/2017 08:13   Assessment and plan- [facility] a 57y.[facility]. [facility]  1.  Stage IV colon cancer: Patient previously received FOLFOX on 09/06/15 with the addition of Avastin on 09/20/2015.  Received a total of 31 cycles completing in December 2018.  Had chemo holiday until most recent scans indicating progression of disease. Cycle 1 S/p FOLFIRI plus Avastin on 12/03/17.  Return to clinic on Wednesday, 12/05/2017 for discontinuation of pump and was noted to have extreme nausea/vomiting and diarrhea.  She was dehydrated and given 1 L NaCl +8 mg Zofran.  She was instructed to take OTC antiemetics and antidiarrheals around-the-clock  until symptoms resolved.  Instructed to return to clinic if symptoms did not resolve  on Friday for repeat lab draw and evaluation.  2.  Dehydration: As of this morning patient's symptoms have completely resolved.  She took medications as directed.  She feels much better.  Labs today look great.  Patient does not need any additional fluids today.  She can return as scheduled to see Dr. Rogue Bussing for cycle 2 FOLFIRI plus Avastin on 12/17/2017 and again on 12/19/2017 for discontinuation of pump.  She knows she can come back to clinic at any point should symptoms return.   Visit Diagnosis 1. Nausea without vomiting   2. Dehydration   3. Diarrhea, unspecified type   4. Cancer of ascending colon metastatic to intra-abdominal lymph node [facility])     Patient expressed understanding and was in agreement with this plan. She also understands that She can call clinic at any time with any questions, concerns, or complaints.   Greater than 50% was spent in counseling and coordination of care with this patient including but not limited to discussion of the relevant topics above (See A&P) including, but not limited to diagnosis and management of acute and chronic medical conditions.    Faythe Casa, AGNP-C [facility] Office: [phone] Pager- [phone] 12/10/2017 7:37 AM

## 2017-12-17 ENCOUNTER — Inpatient Hospital Stay: Payer: BLUE CROSS/BLUE SHIELD

## 2017-12-17 ENCOUNTER — Other Ambulatory Visit: Payer: Self-pay

## 2017-12-17 ENCOUNTER — Inpatient Hospital Stay (HOSPITAL_BASED_OUTPATIENT_CLINIC_OR_DEPARTMENT_OTHER): Payer: BLUE CROSS/BLUE SHIELD | Admitting: Internal Medicine

## 2017-12-17 ENCOUNTER — Encounter: Payer: Self-pay | Admitting: Internal Medicine

## 2017-12-17 VITALS — BP 99/61 | HR 74 | Temp 97.9°F | Resp 18

## 2017-12-17 DIAGNOSIS — E876 Hypokalemia: Secondary | ICD-10-CM

## 2017-12-17 DIAGNOSIS — C182 Malignant neoplasm of ascending colon: Secondary | ICD-10-CM

## 2017-12-17 DIAGNOSIS — C772 Secondary and unspecified malignant neoplasm of intra-abdominal lymph nodes: Secondary | ICD-10-CM | POA: Diagnosis not present

## 2017-12-17 DIAGNOSIS — R112 Nausea with vomiting, unspecified: Secondary | ICD-10-CM | POA: Diagnosis not present

## 2017-12-17 DIAGNOSIS — R197 Diarrhea, unspecified: Secondary | ICD-10-CM | POA: Diagnosis not present

## 2017-12-17 DIAGNOSIS — T451X5A Adverse effect of antineoplastic and immunosuppressive drugs, initial encounter: Secondary | ICD-10-CM

## 2017-12-17 DIAGNOSIS — C7951 Secondary malignant neoplasm of bone: Secondary | ICD-10-CM

## 2017-12-17 LAB — COMPREHENSIVE METABOLIC PANEL
ALK PHOS: 85 U/L (ref 38–126)
ALT: 9 U/L (ref 0–44)
AST: 14 U/L — ABNORMAL LOW (ref 15–41)
Albumin: 3.5 g/dL (ref 3.5–5.0)
Anion gap: 8 (ref 5–15)
BUN: 14 mg/dL (ref 6–20)
CALCIUM: 8.6 mg/dL — AB (ref 8.9–10.3)
CO2: 22 mmol/L (ref 22–32)
Chloride: 106 mmol/L (ref 98–111)
Creatinine, Ser: 0.86 mg/dL (ref 0.44–1.00)
GFR calc non Af Amer: >60 mL/min (ref >60)
Glucose, Bld: 89 mg/dL (ref 70–99)
Potassium: 2.9 mmol/L — ABNORMAL LOW (ref 3.5–5.1)
SODIUM: 136 mmol/L (ref 135–145)
Total Bilirubin: 0.4 mg/dL (ref 0.3–1.2)
Total Protein: 6.5 g/dL (ref 6.5–8.1)

## 2017-12-17 LAB — CBC WITH DIFFERENTIAL/PLATELET
BASOS ABS: 0.1 10*3/uL (ref 0–0.1)
BASOS PCT: 1 %
EOS PCT: 2 %
Eosinophils Absolute: 0.1 10*3/uL (ref 0–0.7)
HEMATOCRIT: 32.4 % — AB (ref 35.0–47.0)
Hemoglobin: 10.8 g/dL — ABNORMAL LOW (ref 12.0–16.0)
Lymphocytes Relative: 20 %
Lymphs Abs: 1.1 10*3/uL (ref 1.0–3.6)
MCH: 30.4 pg (ref 26.0–34.0)
MCHC: 33.5 g/dL (ref 32.0–36.0)
MCV: 90.7 fL (ref 80.0–100.0)
MONO ABS: 0.5 10*3/uL (ref 0.2–0.9)
MONOS PCT: 9 %
Neutro Abs: 3.8 10*3/uL (ref 1.4–6.5)
Neutrophils Relative %: 68 %
PLATELETS: 134 10*3/uL — AB (ref 150–440)
RBC: 3.57 MIL/uL — ABNORMAL LOW (ref 3.80–5.20)
RDW: 16.3 % — AB (ref 11.5–14.5)
WBC: 5.5 10*3/uL (ref 3.6–11.0)

## 2017-12-17 LAB — URINALYSIS, COMPLETE (UACMP) WITH MICROSCOPIC
Bacteria, UA: NONE SEEN
Bilirubin Urine: NEGATIVE
GLUCOSE, UA: NEGATIVE mg/dL
Hgb urine dipstick: NEGATIVE
KETONES UR: NEGATIVE mg/dL
Leukocytes, UA: NEGATIVE
NITRITE: NEGATIVE
Protein, ur: NEGATIVE mg/dL
Specific Gravity, Urine: 1.016 (ref 1.005–1.030)
pH: 5 (ref 5.0–8.0)

## 2017-12-17 MED ORDER — PROCHLORPERAZINE MALEATE 10 MG PO TABS: 10.0000 mg | ORAL_TABLET | Freq: Four times a day (QID) | ORAL | 1 refills | Status: DC | PRN

## 2017-12-17 MED ORDER — POTASSIUM CHLORIDE 20 MEQ/100ML IV SOLN
20.0000 meq | Freq: Once | INTRAVENOUS | Status: AC
  Administered 2017-12-17: 20 meq via INTRAVENOUS

## 2017-12-17 MED ORDER — DEXAMETHASONE SODIUM PHOSPHATE 10 MG/ML IJ SOLN
10.0000 mg | Freq: Once | INTRAMUSCULAR | Status: AC
  Administered 2017-12-17: 10 mg via INTRAVENOUS
  Filled 2017-12-17: qty 1

## 2017-12-17 MED ORDER — HEPARIN SOD (PORK) LOCK FLUSH 100 UNIT/ML IV SOLN: 500.0000 [IU] | Freq: Once | INTRAVENOUS | Status: DC | PRN

## 2017-12-17 MED ORDER — SODIUM CHLORIDE 0.9 % IV SOLN
5.0000 mg/kg | Freq: Once | INTRAVENOUS | Status: AC
  Administered 2017-12-17: 250 mg via INTRAVENOUS
  Filled 2017-12-17: qty 10

## 2017-12-17 MED ORDER — SODIUM CHLORIDE 0.9 % IV SOLN
Freq: Once | INTRAVENOUS | Status: AC
  Administered 2017-12-17: 11:00:00 via INTRAVENOUS
  Filled 2017-12-17: qty 1000

## 2017-12-17 MED ORDER — DIPHENOXYLATE-ATROPINE 2.5-0.025 MG PO TABS: 1.0000 | ORAL_TABLET | Freq: Four times a day (QID) | ORAL | 4 refills | Status: DC | PRN

## 2017-12-17 MED ORDER — ONDANSETRON HCL 8 MG PO TABS: 8.0000 mg | ORAL_TABLET | Freq: Three times a day (TID) | ORAL | 0 refills | Status: DC | PRN

## 2017-12-17 MED ORDER — POTASSIUM CHLORIDE CRYS ER 20 MEQ PO TBCR: EXTENDED_RELEASE_TABLET | ORAL | 3 refills | Status: DC

## 2017-12-17 MED ORDER — SODIUM CHLORIDE 0.9 % IV SOLN
2400.0000 mg/m2 | INTRAVENOUS | Status: DC
  Administered 2017-12-17: 3500 mg via INTRAVENOUS
  Filled 2017-12-17: qty 50

## 2017-12-17 MED ORDER — SODIUM CHLORIDE 0.9 % IV SOLN
10.0000 mg | Freq: Once | INTRAVENOUS | Status: DC
  Filled 2017-12-17: qty 1

## 2017-12-17 NOTE — Progress Notes (Signed)
[place] OFFICE PROGRESS NOTE  Patient Care Team: Coral Spikes, DO as PCP - General (Family Medicine)  Cancer Staging No matching staging information was found for the patient.   Oncology History   # [facility] 2017- COLON CANCER- STAGE IV [s/p right hemi-colectomy; pT4apN2 (7/19LN)]; Pre-op CEA- [id number]  PET-  mesenteric adenopathy/mediastinal adenopathy/right-sided subclavicular lymph node.    April 17th -START FOLFOX; May 1st Add Avastin;   Aug 16th- Disc OX [sec PN]; con 5FU-Avastin; NOV 27th CT C/A/P- CR; except for 62m LUL; 5FU-Avastin q 3 W [Poor tolerance to FOLFOX].  # June/July 2019- Progression of Liver/Abd LN; July 2019- START FOLFIRI + Avastin  # March 2018- L2 uptake MET s/p RT [[facility]2018]; mid April X-geva   # MOLECULAR TESTING: K-ras-EXON 2- MUTATED; MSI- STABLE.   DIAGNOSIS: colon ca  STAGE:  IV       ;GOALS: pallaitive  CURRENT/MOST RECENT THERAPY: FOLFIRI+Avastin      Cancer of ascending colon metastatic to intra-abdominal lymph node (HCC)      INTERVAL HISTORY:  Nancy SHASTEEN89y.o.  female pleasant patient above history of metastatic colon cancer with recurrence currently on FOLFIRI plus Avastin is here for follow-up.  Patient noted to have significant worsening of diarrhea after the first cycle of FOLFIRI 2 weeks ago.  Lasted for about 3 to 4 days multiple loose stools.  Patient needed IV fluids.  Patient had intermittent nausea with vomiting.  Patient took Imodium without any help.  She did not take Lomotil.  Currently diarrhea is improved.  Continues to have chronic back pain.  Review of Systems  Constitutional: Negative for chills, diaphoresis, fever, malaise/fatigue and weight loss.  HENT: Negative for nosebleeds and sore throat.   Eyes: Negative for double vision.  Respiratory: Negative for cough, hemoptysis, sputum production, shortness of breath and wheezing.   Cardiovascular: Negative for chest pain, palpitations,  orthopnea and leg swelling.  Gastrointestinal: Positive for diarrhea. Negative for abdominal pain, blood in stool, constipation, heartburn, melena, nausea and vomiting.  Genitourinary: Negative for dysuria, frequency and urgency.  Musculoskeletal: Positive for back pain. Negative for joint pain.  Skin: Negative.  Negative for itching and rash.  Neurological: Negative for dizziness, tingling, focal weakness, weakness and headaches.  Endo/Heme/Allergies: Does not bruise/bleed easily.  Psychiatric/Behavioral: Negative for depression. The patient is not nervous/anxious and does not have insomnia.       PAST MEDICAL HISTORY :  Past Medical History:  Diagnosis Date  . Chicken pox   . [facility] [place]    Partial colectomy 07/2015 + chemo tx's  . Hypertension   . Hypokalemia   . Menopause    > 5 yrs  . Peripheral neuropathy due to chemotherapy ([facility]     PAST SURGICAL HISTORY :   Past Surgical History:  Procedure Laterality Date  . BREAST BIOPSY Right    2007? pt unsure done by [facility]  . COLONOSCOPY    . COLONOSCOPY WITH PROPOFOL N/A 10/14/2015   Procedure: COLONOSCOPY WITH PROPOFOL;  Surgeon: PHulen Luster MD;  [facility];  Service: Endoscopy;  Laterality: N/A;  . COLOSTOMY REVISION Right 08/09/2015   Procedure: COLON RESECTION RIGHT;  Surgeon: RFlorene Glen MD;  Location: [facility] ORS;  Service: General;  Laterality: Right;  . ECTOPIC PREGNANCY SURGERY    . PORTACATH PLACEMENT N/A 09/01/2015   Procedure: INSERTION PORT-A-CATH;  Surgeon: RFlorene Glen MD;  Location: [facility] ORS;  Service: General;  Laterality: N/A;  . SHOULDER  SURGERY Left     FAMILY HISTORY :   Family History  Problem Relation Age of Onset  . Hypertension Mother   . Arthritis Father   . Prostate cancer Father   . Diabetes Maternal Grandmother   . Colon cancer Maternal Grandfather        colon    SOCIAL HISTORY:   Social History   Tobacco Use  . Smoking status: Former Smoker    Packs/day: 0.25     Years: 2.00    Pack years: 0.50  . Smokeless tobacco: Never Used  . Tobacco comment: recently quit  Substance Use Topics  . Alcohol use: No    Alcohol/week: 0.0 oz  . Drug use: No    ALLERGIES:  has No Known Allergies.  MEDICATIONS:  Current Outpatient Medications  Medication Sig Dispense Refill  . acetaminophen (TYLENOL) 325 MG tablet Take 2 tablets (650 mg total) by mouth every 6 (six) hours as needed for mild pain (or Fever >/= 101).    . benazepril (LOTENSIN) 20 MG tablet TAKE 1 TABLET DAILY 90 tablet 1  . DULoxetine (CYMBALTA) 20 MG capsule TAKE 2 CAPSULES DAILY 180 capsule 0  . feeding supplement (BOOST HIGH PROTEIN) LIQD Take 1 Container by mouth daily.    . ferrous sulfate (FERROUSUL) 325 (65 FE) MG tablet Take 1 tablet (325 mg total) by mouth daily with breakfast. 90 tablet 3  . gabapentin (NEURONTIN) 300 MG capsule One pill in am; and 2 pills at night prior to sleep. 90 capsule 3  . hydrochlorothiazide (MICROZIDE) 12.5 MG capsule TAKE 1 CAPSULE DAILY 30 capsule 6  . HYDROcodone-acetaminophen (NORCO/VICODIN) 5-325 MG tablet Take 1 tablet by mouth every 12 (twelve) hours as needed for moderate pain. 60 tablet 0  . lidocaine-prilocaine (EMLA) cream Apply 1 application topically as needed. Apply generously over the Mediport 45 minutes prior to chemotherapy. 30 g 6  . Multiple Vitamins-Minerals (MULTIVITAMIN ADULT PO) Take 1 tablet by mouth daily.    . ondansetron (ZOFRAN) 8 MG tablet Take 1 tablet (8 mg total) by mouth every 8 (eight) hours as needed for nausea or vomiting (start 3 days; after chemo). 40 tablet 0  . potassium chloride (K-DUR) 10 MEQ tablet Take 6 tablets (60 mEq total) by mouth daily. 540 tablet 0  . prochlorperazine (COMPAZINE) 10 MG tablet Take 1 tablet (10 mg total) by mouth every 6 (six) hours as needed for nausea or vomiting. 90 tablet 1  . diphenoxylate-atropine (LOMOTIL) 2.5-0.025 MG tablet Take 1 tablet by mouth 4 (four) times daily as needed for  diarrhea or loose stools. Take it along with immodium 40 tablet 4  . lidocaine (LIDODERM) 5 % Place 1 patch onto the skin daily. Remove & Discard patch within 12 hours or as directed by MD (Patient not taking: Reported on 12/17/2017) 30 patch 0  . loperamide (IMODIUM) 2 MG capsule Take 2 mg by mouth as needed for diarrhea or loose stools.    . potassium chloride SA (K-DUR,KLOR-CON) 20 MEQ tablet 1 pill twice a day 60 tablet 3   No current facility-administered medications for this visit.    Facility-Administered Medications Ordered in Other Visits  Medication Dose Route Frequency Provider Last Rate Last Dose  . sodium chloride flush (NS) 0.9 % injection 10 mL  10 mL Intravenous PRN Cammie Sickle, MD   10 mL at 10/04/15 0901  . sodium chloride flush (NS) 0.9 % injection 10 mL  10 mL Intravenous PRN Cammie Sickle, MD  10 mL at 07/02/17 1020    PHYSICAL EXAMINATION: ECOG PERFORMANCE STATUS: 1 - Symptomatic but completely ambulatory  BP 99/61   Pulse 74   Temp 97.9 F (36.6 C) (Tympanic)   Resp 18   LMP  (LMP Unknown) Comment: LMP MORE THAN 5 YRS  There were no vitals filed for this visit.  GENERAL: Well-nourished well-developed; Alert, no distress and comfortable.  Accompanied by her mother.  EYES: no pallor or icterus OROPHARYNX: no thrush or ulceration; NECK: supple; no lymph nodes felt. LYMPH:  no palpable lymphadenopathy in the axillary or inguinal regions LUNGS: Decreased breath sounds auscultation bilaterally. No wheeze or crackles HEART/CVS: regular rate & rhythm and no murmurs; No lower extremity edema ABDOMEN:abdomen soft, non-tender and normal bowel sounds. No hepatomegaly or splenomegaly.  Musculoskeletal:no cyanosis of digits and no clubbing  PSYCH: alert & oriented x 3 with fluent speech NEURO: no focal motor/sensory deficits SKIN:  no rashes or significant lesions    LABORATORY DATA:  I have reviewed the data as listed    Component Value Date/Time    NA 136 12/17/2017 0917   K 2.9 (L) 12/17/2017 0917   CL 106 12/17/2017 0917   CO2 22 12/17/2017 0917   GLUCOSE 89 12/17/2017 0917   BUN 14 12/17/2017 0917   CREATININE 0.86 12/17/2017 0917   CALCIUM 8.6 (L) 12/17/2017 0917   PROT 6.5 12/17/2017 0917   ALBUMIN 3.5 12/17/2017 0917   AST 14 (L) 12/17/2017 0917   ALT 9 12/17/2017 0917   ALKPHOS 85 12/17/2017 0917   BILITOT 0.4 12/17/2017 0917   GFRNONAA >60 12/17/2017 0917   GFRAA >60 12/17/2017 0917    No results found for: SPEP, UPEP  Lab Results  Component Value Date   WBC 5.5 12/17/2017   NEUTROABS 3.8 12/17/2017   HGB 10.8 (L) 12/17/2017   HCT 32.4 (L) 12/17/2017   MCV 90.7 12/17/2017   PLT 134 (L) 12/17/2017      Chemistry      Component Value Date/Time   NA 136 12/17/2017 0917   K 2.9 (L) 12/17/2017 0917   CL 106 12/17/2017 0917   CO2 22 12/17/2017 0917   BUN 14 12/17/2017 0917   CREATININE 0.86 12/17/2017 0917      Component Value Date/Time   CALCIUM 8.6 (L) 12/17/2017 0917   ALKPHOS 85 12/17/2017 0917   AST 14 (L) 12/17/2017 0917   ALT 9 12/17/2017 0917   BILITOT 0.4 12/17/2017 0917       RADIOGRAPHIC STUDIES: I have personally reviewed the radiological images as listed and agreed with the findings in the report. No results found.   ASSESSMENT & PLAN:  Cancer of ascending colon metastatic to intra-abdominal lymph node [facility]) # Colon cancer- cecal/ right-sided; STAGE IV; CT scan June 2019 shows worsening liver mets; intra-abdominal metastases; question worsening L2 bone lesion- sclerosis; MRI liver- increasing mets;  -currently restarted on FOLFIRI [since July 2019].  Clinically stable.  Patient tolerating FOLFIRI poorly with severe diarrhea-see discussion below  # proceed with FOLFIRI + Avastin [also hold off Iri today]- tolerating poorly with diarrhea [see below]  # Diarrhea-sec to iri- improved; lomotil- new script give; take with imoidum. HOLD irinotecan today.   # Hypokalemia- 2.9; new-  recommend IV KCl; K-dur.   #Follow-up in 2 weeks; FOLFIRI plus Avastin; UA labs.   Orders Placed This Encounter  Procedures  . CBC with Differential    Standing Status:   Standing    Number of Occurrences:  20    Standing Expiration Date:   12/18/2018  . Comprehensive metabolic panel    Standing Status:   Standing    Number of Occurrences:   20    Standing Expiration Date:   12/18/2018  . Urinalysis, Complete w Microscopic    Standing Status:   Standing    Number of Occurrences:   20    Standing Expiration Date:   12/18/2018   All questions were answered. The patient knows to call the clinic with any problems, questions or concerns.      Cammie Sickle, MD 12/18/2017 11:09 PM

## 2017-12-17 NOTE — Assessment & Plan Note (Addendum)
#   Colon cancer- cecal/ right-sided; STAGE IV; CT scan June 2019 shows worsening liver mets; intra-abdominal metastases; question worsening L2 bone lesion- sclerosis; MRI liver- increasing mets;  -currently restarted on FOLFIRI [since July 2019].  Clinically stable.  Patient tolerating FOLFIRI poorly with severe diarrhea-see discussion below  # proceed with FOLFIRI + Avastin [also hold off Iri today]- tolerating poorly with diarrhea [see below]  # Diarrhea-sec to iri- improved; lomotil- new script give; take with imoidum. HOLD irinotecan today.   # Hypokalemia- 2.9; new- recommend IV KCl; K-dur.   #Follow-up in 2 weeks; FOLFIRI plus Avastin; UA labs.

## 2017-12-17 NOTE — Progress Notes (Signed)
Patient did not receive RF on lomotil. Reviewed chart- script was printed rather than escribe. RN will ask Dr. Jacinto Reap to resubmit for patient.

## 2017-12-19 ENCOUNTER — Inpatient Hospital Stay: Payer: BLUE CROSS/BLUE SHIELD

## 2017-12-19 VITALS — BP 104/69 | HR 76 | Temp 97.1°F | Resp 18

## 2017-12-19 DIAGNOSIS — C182 Malignant neoplasm of ascending colon: Secondary | ICD-10-CM

## 2017-12-19 DIAGNOSIS — C772 Secondary and unspecified malignant neoplasm of intra-abdominal lymph nodes: Principal | ICD-10-CM

## 2017-12-19 MED ORDER — HEPARIN SOD (PORK) LOCK FLUSH 100 UNIT/ML IV SOLN
500.0000 [IU] | Freq: Once | INTRAVENOUS | Status: AC | PRN
  Administered 2017-12-19: 500 [IU]
  Filled 2017-12-19: qty 5

## 2017-12-19 NOTE — Progress Notes (Signed)
Pt states she is feeling good at this time and does not feel the need to receive any additional IVFs. Pt and VS stable at discharge. Heather RN aware.

## 2017-12-31 ENCOUNTER — Encounter: Payer: Self-pay | Admitting: Internal Medicine

## 2017-12-31 ENCOUNTER — Inpatient Hospital Stay: Payer: BLUE CROSS/BLUE SHIELD | Attending: Internal Medicine | Admitting: Internal Medicine

## 2017-12-31 ENCOUNTER — Inpatient Hospital Stay: Payer: BLUE CROSS/BLUE SHIELD

## 2017-12-31 ENCOUNTER — Other Ambulatory Visit: Payer: Self-pay

## 2017-12-31 VITALS — BP 111/73 | HR 70 | Temp 97.6°F | Resp 18 | Ht 60.0 in | Wt 106.0 lb

## 2017-12-31 DIAGNOSIS — G620 Drug-induced polyneuropathy: Secondary | ICD-10-CM | POA: Diagnosis not present

## 2017-12-31 DIAGNOSIS — Z87891 Personal history of nicotine dependence: Secondary | ICD-10-CM | POA: Diagnosis not present

## 2017-12-31 DIAGNOSIS — C772 Secondary and unspecified malignant neoplasm of intra-abdominal lymph nodes: Secondary | ICD-10-CM | POA: Diagnosis not present

## 2017-12-31 DIAGNOSIS — C182 Malignant neoplasm of ascending colon: Secondary | ICD-10-CM | POA: Diagnosis present

## 2017-12-31 DIAGNOSIS — E876 Hypokalemia: Secondary | ICD-10-CM | POA: Diagnosis not present

## 2017-12-31 DIAGNOSIS — Z5111 Encounter for antineoplastic chemotherapy: Secondary | ICD-10-CM | POA: Diagnosis not present

## 2017-12-31 DIAGNOSIS — Z5112 Encounter for antineoplastic immunotherapy: Secondary | ICD-10-CM | POA: Insufficient documentation

## 2017-12-31 DIAGNOSIS — I10 Essential (primary) hypertension: Secondary | ICD-10-CM | POA: Diagnosis not present

## 2017-12-31 LAB — COMPREHENSIVE METABOLIC PANEL
ALBUMIN: 3.4 g/dL — AB (ref 3.5–5.0)
ALK PHOS: 88 U/L (ref 38–126)
ALT: 9 U/L (ref 0–44)
ANION GAP: 7 (ref 5–15)
AST: 15 U/L (ref 15–41)
BILIRUBIN TOTAL: 0.4 mg/dL (ref 0.3–1.2)
BUN: 9 mg/dL (ref 6–20)
CO2: 21 mmol/L — ABNORMAL LOW (ref 22–32)
Calcium: 8.8 mg/dL — ABNORMAL LOW (ref 8.9–10.3)
Chloride: 112 mmol/L — ABNORMAL HIGH (ref 98–111)
Creatinine, Ser: 0.78 mg/dL (ref 0.44–1.00)
GFR calc Af Amer: >60 mL/min (ref >60)
GLUCOSE: 96 mg/dL (ref 70–99)
Potassium: 4 mmol/L (ref 3.5–5.1)
Sodium: 140 mmol/L (ref 135–145)
TOTAL PROTEIN: 6.2 g/dL — AB (ref 6.5–8.1)

## 2017-12-31 LAB — CBC WITH DIFFERENTIAL/PLATELET
Basophils Absolute: 0.1 10*3/uL (ref 0–0.1)
Basophils Relative: 1 %
EOS ABS: 0.1 10*3/uL (ref 0–0.7)
EOS PCT: 2 %
HCT: 34.3 % — ABNORMAL LOW (ref 35.0–47.0)
HEMOGLOBIN: 11.5 g/dL — AB (ref 12.0–16.0)
Lymphocytes Relative: 20 %
Lymphs Abs: 1.2 10*3/uL (ref 1.0–3.6)
MCH: 30.9 pg (ref 26.0–34.0)
MCHC: 33.4 g/dL (ref 32.0–36.0)
MCV: 92.4 fL (ref 80.0–100.0)
MONO ABS: 0.5 10*3/uL (ref 0.2–0.9)
Monocytes Relative: 9 %
NEUTROS ABS: 4 10*3/uL (ref 1.4–6.5)
Neutrophils Relative %: 68 %
Platelets: 105 10*3/uL — ABNORMAL LOW (ref 150–440)
RBC: 3.71 MIL/uL — AB (ref 3.80–5.20)
RDW: 17.4 % — ABNORMAL HIGH (ref 11.5–14.5)
WBC: 5.9 10*3/uL (ref 3.6–11.0)

## 2017-12-31 LAB — URINALYSIS, COMPLETE (UACMP) WITH MICROSCOPIC
BILIRUBIN URINE: NEGATIVE
Bacteria, UA: NONE SEEN
GLUCOSE, UA: NEGATIVE mg/dL
HGB URINE DIPSTICK: NEGATIVE
Ketones, ur: NEGATIVE mg/dL
Leukocytes, UA: NEGATIVE
NITRITE: NEGATIVE
PH: 5 (ref 5.0–8.0)
Protein, ur: NEGATIVE mg/dL
SPECIFIC GRAVITY, URINE: 1.013 (ref 1.005–1.030)
SQUAMOUS EPITHELIAL / LPF: NONE SEEN (ref 0–5)

## 2017-12-31 MED ORDER — SODIUM CHLORIDE 0.9% FLUSH
10.0000 mL | INTRAVENOUS | Status: DC | PRN
  Filled 2017-12-31: qty 10

## 2017-12-31 MED ORDER — SODIUM CHLORIDE 0.9 % IV SOLN
2400.0000 mg/m2 | INTRAVENOUS | Status: DC
  Administered 2017-12-31: 3500 mg via INTRAVENOUS
  Filled 2017-12-31: qty 50

## 2017-12-31 MED ORDER — SODIUM CHLORIDE 0.9 % IV SOLN
Freq: Once | INTRAVENOUS | Status: AC
  Administered 2017-12-31: 10:00:00 via INTRAVENOUS
  Filled 2017-12-31: qty 1000

## 2017-12-31 MED ORDER — DEXAMETHASONE SODIUM PHOSPHATE 10 MG/ML IJ SOLN
10.0000 mg | Freq: Once | INTRAMUSCULAR | Status: AC
  Administered 2017-12-31: 10 mg via INTRAVENOUS
  Filled 2017-12-31: qty 1

## 2017-12-31 MED ORDER — PALONOSETRON HCL INJECTION 0.25 MG/5ML
0.2500 mg | Freq: Once | INTRAVENOUS | Status: AC
  Administered 2017-12-31: 0.25 mg via INTRAVENOUS
  Filled 2017-12-31: qty 5

## 2017-12-31 MED ORDER — SODIUM CHLORIDE 0.9 % IV SOLN: 10.0000 mg | Freq: Once | INTRAVENOUS | Status: DC

## 2017-12-31 MED ORDER — BEVACIZUMAB CHEMO INJECTION 400 MG/16ML
5.0000 mg/kg | Freq: Once | INTRAVENOUS | Status: AC
  Administered 2017-12-31: 250 mg via INTRAVENOUS
  Filled 2017-12-31: qty 10

## 2017-12-31 MED ORDER — HYDROCODONE-ACETAMINOPHEN 5-325 MG PO TABS: 1.0000 | ORAL_TABLET | Freq: Two times a day (BID) | ORAL | 0 refills | Status: DC | PRN

## 2017-12-31 NOTE — Progress Notes (Signed)
[place] OFF[facility] PROGRESS NOTE  Patient Care Team: Coral Spikes, DO as PCP - General (Family Medicine)  Cancer Staging No matching staging information was found for the patient.   Oncology History   # [facility] 2017- COLON CANCER- STAGE IV [s/p right hemi-colectomy; pT4apN2 (7/19LN)]; Pre-op CEA- [id number]-  PET-  mesenteric adenopathy/mediastinal adenopathy/right-sided subclavicular lymph node.    April 17th -START FOLFOX; May 1st Add Avastin;   Aug 16th- Disc OX [sec PN]; con 5FU-Avastin; NOV 27th CT C/A/P- CR; except for 38m LUL; 5FU-Avastin q 3 W [Poor tolerance to FOLFOX].  # June/July 2019- Progression of Liver/Abd LN; July 2019- START FOLFIRI + Avastin  # March 2018- L2 uptake MET s/p RT [[facility]2018]; mid April X-geva   # MOLECULAR TESTING: K-ras-EXON 2- MUTATED; MSI- STABLE.   DIAGNOSIS: colon ca  STAGE:  IV       ;GOALS: pallaitive  CURRENT/MOST RECENT THERAPY: FOLFIRI+Avastin      Cancer of ascending colon metastatic to intra-abdominal lymph node (HCC)      INTERVAL HISTORY:  MKATHERINE TOUT89y.o.  female pleasant patient above history of metastatic adenocarcinoma the colon on 5-FU plus Avastin is here for follow-up.  Patient denies any significant nausea vomiting.  Complains of mild diarrhea.  Denies of mild back pain.  Denies any tingling or numbness in extremities.  No fevers or chills.  Review of Systems  Constitutional: Positive for malaise/fatigue. Negative for chills, diaphoresis, fever and weight loss.  HENT: Negative for nosebleeds and sore throat.   Eyes: Negative for double vision.  Respiratory: Negative for cough, hemoptysis, sputum production, shortness of breath and wheezing.   Cardiovascular: Negative for chest pain, palpitations, orthopnea and leg swelling.  Gastrointestinal: Positive for diarrhea. Negative for abdominal pain, blood in stool, constipation, heartburn, melena, nausea and vomiting.  Genitourinary: Negative for  dysuria, frequency and urgency.  Musculoskeletal: Positive for back pain. Negative for joint pain.  Skin: Negative.  Negative for itching and rash.  Neurological: Negative for dizziness, tingling, focal weakness, weakness and headaches.  Endo/Heme/Allergies: Does not bruise/bleed easily.  Psychiatric/Behavioral: Negative for depression. The patient is not nervous/anxious and does not have insomnia.       PAST MEDICAL HISTORY :  Past Medical History:  Diagnosis Date  . Chicken pox   . [place]    Partial colectomy 07/2015 + chemo tx's  . Hypertension   . Hypokalemia   . Menopause    > 5 yrs  . Peripheral neuropathy due to chemotherapy ([facility]     PAST SURGICAL HISTORY :   Past Surgical History:  Procedure Laterality Date  . BREAST BIOPSY Right    2007? pt unsure done by [facility]  . COLONOSCOPY    . COLONOSCOPY WITH PROPOFOL N/A 10/14/2015   Procedure: COLONOSCOPY WITH PROPOFOL;  Surgeon: PHulen Luster MD;  [facility];  Service: Endoscopy;  Laterality: N/A;  . COLOSTOMY REVISION Right 08/09/2015   Procedure: COLON RESECTION RIGHT;  Surgeon: RFlorene Glen MD;  Location: [facility] ORS;  Service: General;  Laterality: Right;  . ECTOPIC PREGNANCY SURGERY    . PORTACATH PLACEMENT N/A 09/01/2015   Procedure: INSERTION PORT-A-CATH;  Surgeon: RFlorene Glen MD;  Location: [facility] ORS;  Service: General;  Laterality: N/A;  . SHOULDER SURGERY Left     FAMILY HISTORY :   Family History  Problem Relation Age of Onset  . Hypertension Mother   . Arthritis Father   . Prostate cancer Father   .  Diabetes Maternal Grandmother   . Colon cancer Maternal Grandfather        colon    SOCIAL HISTORY:   Social History   Tobacco Use  . Smoking status: Former Smoker    Packs/day: 0.25    Years: 2.00    Pack years: 0.50  . Smokeless tobacco: Never Used  . Tobacco comment: recently quit  Substance Use Topics  . Alcohol use: No    Alcohol/week: 0.0 standard drinks  . Drug use:  No    ALLERGIES:  has No Known Allergies.  MEDICATIONS:  Current Outpatient Medications  Medication Sig Dispense Refill  . acetaminophen (TYLENOL) 325 MG tablet Take 2 tablets (650 mg total) by mouth every 6 (six) hours as needed for mild pain (or Fever >/= 101).    . DULoxetine (CYMBALTA) 20 MG capsule TAKE 2 CAPSULES DAILY 180 capsule 0  . feeding supplement (BOOST HIGH PROTEIN) LIQD Take 1 Container by mouth daily.    . ferrous sulfate (FERROUSUL) 325 (65 FE) MG tablet Take 1 tablet (325 mg total) by mouth daily with breakfast. 90 tablet 3  . gabapentin (NEURONTIN) 300 MG capsule One pill in am; and 2 pills at night prior to sleep. 90 capsule 3  . hydrochlorothiazide (MICROZIDE) 12.5 MG capsule TAKE 1 CAPSULE DAILY 30 capsule 6  . lidocaine-prilocaine (EMLA) cream Apply 1 application topically as needed. Apply generously over the Mediport 45 minutes prior to chemotherapy. 30 g 6  . loperamide (IMODIUM) 2 MG capsule Take 2 mg by mouth as needed for diarrhea or loose stools.    . Multiple Vitamins-Minerals (MULTIVITAMIN ADULT PO) Take 1 tablet by mouth daily.    . ondansetron (ZOFRAN) 8 MG tablet Take 1 tablet (8 mg total) by mouth every 8 (eight) hours as needed for nausea or vomiting (start 3 days; after chemo). 40 tablet 0  . potassium chloride SA (K-DUR,KLOR-CON) 20 MEQ tablet 1 pill twice a day 60 tablet 3  . prochlorperazine (COMPAZINE) 10 MG tablet Take 1 tablet (10 mg total) by mouth every 6 (six) hours as needed for nausea or vomiting. 90 tablet 1  . benazepril (LOTENSIN) 20 MG tablet TAKE 1 TABLET DAILY 90 tablet 1  . diphenoxylate-atropine (LOMOTIL) 2.5-0.025 MG tablet Take 1 tablet by mouth 4 (four) times daily as needed for diarrhea or loose stools. Take it along with immodium (Patient not taking: Reported on 12/31/2017) 40 tablet 4  . HYDROcodone-acetaminophen (NORCO/VICODIN) 5-325 MG tablet Take 1 tablet by mouth every 12 (twelve) hours as needed for moderate pain. 60 tablet 0    No current facility-administered medications for this visit.    Facility-Administered Medications Ordered in Other Visits  Medication Dose Route Frequency Provider Last Rate Last Dose  . sodium chloride flush (NS) 0.9 % injection 10 mL  10 mL Intravenous PRN Cammie Sickle, MD   10 mL at 10/04/15 0901  . sodium chloride flush (NS) 0.9 % injection 10 mL  10 mL Intravenous PRN Cammie Sickle, MD   10 mL at 07/02/17 1020    PHYSICAL EXAMINATION: ECOG PERFORMANCE STATUS: 1 - Symptomatic but completely ambulatory  BP 111/73 (Patient Position: Sitting)   Pulse 70   Temp 97.6 F (36.4 C) (Tympanic)   Resp 18   Ht 5' (1.524 m)   Wt 106 lb (48.1 kg)   LMP  (LMP Unknown) Comment: LMP MORE THAN 5 YRS  BMI 20.70 kg/m   Filed Weights   12/31/17 0843  Weight: 106 lb (  48.1 kg)    GENERAL: Well-nourished well-developed; Alert, no distress and comfortable.  Accompanied by mother.  EYES: no pallor or icterus OROPHARYNX: no thrush or ulceration; NECK: supple; no lymph nodes felt. LYMPH:  no palpable lymphadenopathy in the axillary or inguinal regions LUNGS: Decreased breath sounds auscultation bilaterally. No wheeze or crackles HEART/CVS: regular rate & rhythm and no murmurs; No lower extremity edema ABDOMEN:abdomen soft, non-tender and normal bowel sounds. No hepatomegaly or splenomegaly.  Musculoskeletal:no cyanosis of digits and no clubbing  PSYCH: alert & oriented x 3 with fluent speech NEURO: no focal motor/sensory deficits SKIN:  no rashes or significant lesions    LABORATORY DATA:  I have reviewed the data as listed    Component Value Date/Time   NA 138 01/14/2018 0810   K 4.2 01/14/2018 0810   CL 111 01/14/2018 0810   CO2 21 (L) 01/14/2018 0810   GLUCOSE 93 01/14/2018 0810   BUN 13 01/14/2018 0810   CREATININE 1.02 (H) 01/14/2018 0810   CALCIUM 8.9 01/14/2018 0810   PROT 6.5 01/14/2018 0810   ALBUMIN 3.6 01/14/2018 0810   AST 14 (L) 01/14/2018 0810   ALT  11 01/14/2018 0810   ALKPHOS 93 01/14/2018 0810   BILITOT 0.4 01/14/2018 0810   GFRNONAA >60 01/14/2018 0810   GFRAA >60 01/14/2018 0810    No results found for: SPEP, UPEP  Lab Results  Component Value Date   WBC 7.0 01/14/2018   NEUTROABS 4.6 01/14/2018   HGB 12.1 01/14/2018   HCT 36.1 01/14/2018   MCV 93.0 01/14/2018   PLT 119 (L) 01/14/2018      Chemistry      Component Value Date/Time   NA 138 01/14/2018 0810   K 4.2 01/14/2018 0810   CL 111 01/14/2018 0810   CO2 21 (L) 01/14/2018 0810   BUN 13 01/14/2018 0810   CREATININE 1.02 (H) 01/14/2018 0810      Component Value Date/Time   CALCIUM 8.9 01/14/2018 0810   ALKPHOS 93 01/14/2018 0810   AST 14 (L) 01/14/2018 0810   ALT 11 01/14/2018 0810   BILITOT 0.4 01/14/2018 0810       RADIOGRAPHIC STUDIES: I have personally reviewed the radiological images as listed and agreed with the findings in the report. No results found.   ASSESSMENT & PLAN:  Cancer of ascending colon metastatic to intra-abdominal lymph node [facility]) # Colon cancer- cecal/ right-sided; STAGE IV; CT scan June 2019 shows worsening liver mets; intra-abdominal metastases; question worsening L2 bone lesion- sclerosis; MRI liver- increasing mets;  -currently restarted on FOLFIRI [since July 2019]. STABLE.  # proceed with 5FU+ Avastin [also hold off Iri today]- tolerating poorly with diarrhea [see below]  # Diarrhea-sec to iri-IMPROVED: lomotil- new script give; take with imoidum. HOLD irinotecan today.   # Hypokalemia- 4.0- improved.   # back pain- better- continue norco.  New script sent.  #Follow-up in 2 weeks; FOLFIRI plus Avastin; UA labs. ADD CEA today;    Orders Placed This Encounter  Procedures  . CEA    Standing Status:   Future    Number of Occurrences:   1    Standing Expiration Date:   01/01/2019  . CBC with Differential/Platelet    Standing Status:   Future    Number of Occurrences:   1    Standing Expiration Date:   01/01/2019  .  Comprehensive metabolic panel    Standing Status:   Future    Number of Occurrences:   1  Standing Expiration Date:   01/01/2019  . Urinalysis, Complete w Microscopic    Standing Status:   Future    Number of Occurrences:   1    Standing Expiration Date:   01/01/2019   All questions were answered. The patient knows to call the clinic with any problems, questions or concerns.      Cammie Sickle, MD 01/14/2018 8:51 PM

## 2017-12-31 NOTE — Assessment & Plan Note (Signed)
#   Colon cancer- cecal/ right-sided; STAGE IV; CT scan June 2019 shows worsening liver mets; intra-abdominal metastases; question worsening L2 bone lesion- sclerosis; MRI liver- increasing mets;  -currently restarted on FOLFIRI [since July 2019]. STABLE.  # proceed with 5FU+ Avastin [also hold off Iri today]- tolerating poorly with diarrhea [see below]  # Diarrhea-sec to iri-IMPROVED: lomotil- new script give; take with imoidum. HOLD irinotecan today.   # Hypokalemia- 4.0- improved.   # back pain- better- continue norco.  New script sent.  #Follow-up in 2 weeks; FOLFIRI plus Avastin; UA labs. ADD CEA today;

## 2018-01-01 LAB — CEA: CEA: 11.3 ng/mL — ABNORMAL HIGH (ref 0.0–4.7)

## 2018-01-02 ENCOUNTER — Inpatient Hospital Stay: Payer: BLUE CROSS/BLUE SHIELD

## 2018-01-02 DIAGNOSIS — C182 Malignant neoplasm of ascending colon: Secondary | ICD-10-CM | POA: Diagnosis not present

## 2018-01-02 DIAGNOSIS — C772 Secondary and unspecified malignant neoplasm of intra-abdominal lymph nodes: Principal | ICD-10-CM

## 2018-01-02 MED ORDER — SODIUM CHLORIDE 0.9% FLUSH
10.0000 mL | INTRAVENOUS | Status: DC | PRN
  Administered 2018-01-02: 10 mL
  Filled 2018-01-02: qty 10

## 2018-01-02 MED ORDER — HEPARIN SOD (PORK) LOCK FLUSH 100 UNIT/ML IV SOLN
500.0000 [IU] | Freq: Once | INTRAVENOUS | Status: AC | PRN
  Administered 2018-01-02: 500 [IU]

## 2018-01-02 MED ORDER — HEPARIN SOD (PORK) LOCK FLUSH 100 UNIT/ML IV SOLN
INTRAVENOUS | Status: AC
  Filled 2018-01-02: qty 5

## 2018-01-14 ENCOUNTER — Inpatient Hospital Stay: Payer: BLUE CROSS/BLUE SHIELD

## 2018-01-14 ENCOUNTER — Inpatient Hospital Stay (HOSPITAL_BASED_OUTPATIENT_CLINIC_OR_DEPARTMENT_OTHER): Payer: BLUE CROSS/BLUE SHIELD | Admitting: Internal Medicine

## 2018-01-14 VITALS — BP 109/72 | HR 69 | Temp 97.9°F | Resp 16 | Wt 105.5 lb

## 2018-01-14 DIAGNOSIS — C772 Secondary and unspecified malignant neoplasm of intra-abdominal lymph nodes: Secondary | ICD-10-CM | POA: Diagnosis not present

## 2018-01-14 DIAGNOSIS — C182 Malignant neoplasm of ascending colon: Secondary | ICD-10-CM | POA: Diagnosis not present

## 2018-01-14 DIAGNOSIS — I10 Essential (primary) hypertension: Secondary | ICD-10-CM | POA: Diagnosis not present

## 2018-01-14 DIAGNOSIS — E876 Hypokalemia: Secondary | ICD-10-CM

## 2018-01-14 DIAGNOSIS — G620 Drug-induced polyneuropathy: Secondary | ICD-10-CM

## 2018-01-14 DIAGNOSIS — Z87891 Personal history of nicotine dependence: Secondary | ICD-10-CM

## 2018-01-14 LAB — CBC WITH DIFFERENTIAL/PLATELET
BASOS ABS: 0.1 10*3/uL (ref 0–0.1)
Basophils Relative: 1 %
Eosinophils Absolute: 0.1 10*3/uL (ref 0–0.7)
Eosinophils Relative: 2 %
HEMATOCRIT: 36.1 % (ref 35.0–47.0)
Hemoglobin: 12.1 g/dL (ref 12.0–16.0)
LYMPHS ABS: 1.5 10*3/uL (ref 1.0–3.6)
LYMPHS PCT: 21 %
MCH: 31.2 pg (ref 26.0–34.0)
MCHC: 33.6 g/dL (ref 32.0–36.0)
MCV: 93 fL (ref 80.0–100.0)
MONO ABS: 0.7 10*3/uL (ref 0.2–0.9)
MONOS PCT: 11 %
NEUTROS ABS: 4.6 10*3/uL (ref 1.4–6.5)
Neutrophils Relative %: 65 %
Platelets: 119 10*3/uL — ABNORMAL LOW (ref 150–440)
RBC: 3.88 MIL/uL (ref 3.80–5.20)
RDW: 17.6 % — ABNORMAL HIGH (ref 11.5–14.5)
WBC: 7 10*3/uL (ref 3.6–11.0)

## 2018-01-14 LAB — URINALYSIS, COMPLETE (UACMP) WITH MICROSCOPIC
BACTERIA UA: NONE SEEN
BILIRUBIN URINE: NEGATIVE
Glucose, UA: NEGATIVE mg/dL
Hgb urine dipstick: NEGATIVE
KETONES UR: NEGATIVE mg/dL
LEUKOCYTES UA: NEGATIVE
NITRITE: NEGATIVE
PH: 5 (ref 5.0–8.0)
PROTEIN: NEGATIVE mg/dL
Specific Gravity, Urine: 1.012 (ref 1.005–1.030)

## 2018-01-14 LAB — COMPREHENSIVE METABOLIC PANEL
ALBUMIN: 3.6 g/dL (ref 3.5–5.0)
ALT: 11 U/L (ref 0–44)
AST: 14 U/L — AB (ref 15–41)
Alkaline Phosphatase: 93 U/L (ref 38–126)
Anion gap: 6 (ref 5–15)
BUN: 13 mg/dL (ref 6–20)
CHLORIDE: 111 mmol/L (ref 98–111)
CO2: 21 mmol/L — AB (ref 22–32)
Calcium: 8.9 mg/dL (ref 8.9–10.3)
Creatinine, Ser: 1.02 mg/dL — ABNORMAL HIGH (ref 0.44–1.00)
GFR calc Af Amer: >60 mL/min (ref >60)
GFR calc non Af Amer: >60 mL/min (ref >60)
GLUCOSE: 93 mg/dL (ref 70–99)
POTASSIUM: 4.2 mmol/L (ref 3.5–5.1)
SODIUM: 138 mmol/L (ref 135–145)
Total Bilirubin: 0.4 mg/dL (ref 0.3–1.2)
Total Protein: 6.5 g/dL (ref 6.5–8.1)

## 2018-01-14 MED ORDER — PALONOSETRON HCL INJECTION 0.25 MG/5ML
0.2500 mg | Freq: Once | INTRAVENOUS | Status: AC
  Administered 2018-01-14: 0.25 mg via INTRAVENOUS
  Filled 2018-01-14: qty 5

## 2018-01-14 MED ORDER — ATROPINE SULFATE 1 MG/ML IJ SOLN
0.5000 mg | Freq: Once | INTRAMUSCULAR | Status: AC | PRN
  Administered 2018-01-14: 0.5 mg via INTRAVENOUS
  Filled 2018-01-14: qty 1

## 2018-01-14 MED ORDER — SODIUM CHLORIDE 0.9 % IV SOLN
5.0000 mg/kg | Freq: Once | INTRAVENOUS | Status: AC
  Administered 2018-01-14: 250 mg via INTRAVENOUS
  Filled 2018-01-14: qty 10

## 2018-01-14 MED ORDER — SODIUM CHLORIDE 0.9 % IV SOLN
Freq: Once | INTRAVENOUS | Status: AC
  Administered 2018-01-14: 10:00:00 via INTRAVENOUS
  Filled 2018-01-14: qty 250

## 2018-01-14 MED ORDER — SODIUM CHLORIDE 0.9% FLUSH
10.0000 mL | INTRAVENOUS | Status: DC | PRN
  Administered 2018-01-14: 10 mL via INTRAVENOUS
  Filled 2018-01-14: qty 10

## 2018-01-14 MED ORDER — SODIUM CHLORIDE 0.9 % IV SOLN
2400.0000 mg/m2 | INTRAVENOUS | Status: DC
  Administered 2018-01-14: 3500 mg via INTRAVENOUS
  Filled 2018-01-14: qty 50

## 2018-01-14 MED ORDER — DEXAMETHASONE SODIUM PHOSPHATE 10 MG/ML IJ SOLN
10.0000 mg | Freq: Once | INTRAMUSCULAR | Status: AC
  Administered 2018-01-14: 10 mg via INTRAVENOUS
  Filled 2018-01-14: qty 1

## 2018-01-14 MED ORDER — HEPARIN SOD (PORK) LOCK FLUSH 100 UNIT/ML IV SOLN: 500.0000 [IU] | Freq: Once | INTRAVENOUS | Status: DC

## 2018-01-14 MED ORDER — IRINOTECAN HCL CHEMO INJECTION 100 MG/5ML
120.0000 mg/m2 | Freq: Once | INTRAVENOUS | Status: AC
  Administered 2018-01-14: 180 mg via INTRAVENOUS
  Filled 2018-01-14: qty 4

## 2018-01-14 NOTE — Assessment & Plan Note (Addendum)
#   Colon cancer- cecal/ right-sided; STAGE IV; CT scan June 2019 shows worsening liver mets; intra-abdominal metastases; question worsening L2 bone lesion- sclerosis; MRI liver- increasing mets;  -currently restarted on 5FU+avastin [since July 2019].STABLE.   # proceed with FOLFIRI+ Avastin [reduced iri to 120 mg/m2]; Labs today reviewed;  acceptable for treatment today.   # Diarrhea-sec to iri-IMPROVED: Recommend Imodium/Lomotil for diarrhea.  # Hypokalemia- 4.2- STABLE.   # back pain-continue norco.  STABLE  #Follow-up in 2 weeks; FOLFIRI plus Avastin; UA labs. ADD CEA today; treatment today

## 2018-01-14 NOTE — Progress Notes (Signed)
[facility] OFFICE PROGRESS NOTE  Patient Care Team: Coral Spikes, DO as PCP - General (Family Medicine)  Cancer Staging No matching staging information was found for the patient.   Oncology History   # [facility] 2017- COLON CANCER- STAGE IV [s/p right hemi-colectomy; pT4apN2 (7/19LN)]; Pre-op CEA- [id number]  PET-  mesenteric adenopathy/mediastinal adenopathy/right-sided subclavicular lymph node.    April 17th -START FOLFOX; May 1st Add Avastin;   Aug 16th- Disc OX [sec PN]; con 5FU-Avastin; NOV 27th CT C/A/P- CR; except for 56m LUL; 5FU-Avastin q 3 W [Poor tolerance to FOLFOX].  # June/July 2019- Progression of Liver/Abd LN; July 2019- START FOLFIRI + Avastin  # March 2018- L2 uptake MET s/p RT [[facility]2018]; mid April X-geva   # MOLECULAR TESTING: K-ras-EXON 2- MUTATED; MSI- STABLE.   DIAGNOSIS: colon ca  STAGE:  IV       ;GOALS: pallaitive  CURRENT/MOST RECENT THERAPY: FOLFIRI+Avastin      Cancer of ascending colon metastatic to intra-abdominal lymph node (HCC)      INTERVAL HISTORY:  MYAHAIRA BRUSKI89y.o.  female pleasant patient above history of metastatic adenocarcinoma of the colon currently on 5-FU plus Avastin is here for follow-up.  Patient denies any diarrhea.  Denies any nausea vomiting.  Continues to have mild back pain.  For which she is taking hydrocodone as needed.   Review of Systems  Constitutional: Positive for malaise/fatigue. Negative for chills, diaphoresis, fever and weight loss.  HENT: Negative for nosebleeds and sore throat.   Eyes: Negative for double vision.  Respiratory: Negative for cough, hemoptysis, sputum production, shortness of breath and wheezing.   Cardiovascular: Negative for chest pain, palpitations, orthopnea and leg swelling.  Gastrointestinal: Negative for abdominal pain, blood in stool, constipation, diarrhea, heartburn, melena, nausea and vomiting.  Genitourinary: Negative for dysuria, frequency and urgency.   Musculoskeletal: Positive for back pain. Negative for joint pain.  Skin: Negative.  Negative for itching and rash.  Neurological: Negative for dizziness, tingling, focal weakness, weakness and headaches.  Endo/Heme/Allergies: Does not bruise/bleed easily.  Psychiatric/Behavioral: Negative for depression. The patient is not nervous/anxious and does not have insomnia.       PAST MEDICAL HISTORY :  Past Medical History:  Diagnosis Date  . Chicken pox   . [place]    Partial colectomy 07/2015 + chemo tx's  . Hypertension   . Hypokalemia   . Menopause    > 5 yrs  . Peripheral neuropathy due to chemotherapy ([facility]     PAST SURGICAL HISTORY :   Past Surgical History:  Procedure Laterality Date  . BREAST BIOPSY Right    2007? pt unsure done by [facility]  . COLONOSCOPY    . COLONOSCOPY WITH PROPOFOL N/A 10/14/2015   Procedure: COLONOSCOPY WITH PROPOFOL;  Surgeon: PHulen Luster MD;  [facility];  Service: Endoscopy;  Laterality: N/A;  . COLOSTOMY REVISION Right 08/09/2015   Procedure: COLON RESECTION RIGHT;  Surgeon: RFlorene Glen MD;  Location: [facility] ORS;  Service: General;  Laterality: Right;  . ECTOPIC PREGNANCY SURGERY    . PORTACATH PLACEMENT N/A 09/01/2015   Procedure: INSERTION PORT-A-CATH;  Surgeon: RFlorene Glen MD;  Location: [facility] ORS;  Service: General;  Laterality: N/A;  . SHOULDER SURGERY Left     FAMILY HISTORY :   Family History  Problem Relation Age of Onset  . Hypertension Mother   . Arthritis Father   . Prostate cancer Father   . Diabetes Maternal  Grandmother   . Colon cancer Maternal Grandfather        colon    SOCIAL HISTORY:   Social History   Tobacco Use  . Smoking status: Former Smoker    Packs/day: 0.25    Years: 2.00    Pack years: 0.50  . Smokeless tobacco: Never Used  . Tobacco comment: recently quit  Substance Use Topics  . Alcohol use: No    Alcohol/week: 0.0 standard drinks  . Drug use: No    ALLERGIES:  has No  Known Allergies.  MEDICATIONS:  Current Outpatient Medications  Medication Sig Dispense Refill  . acetaminophen (TYLENOL) 325 MG tablet Take 2 tablets (650 mg total) by mouth every 6 (six) hours as needed for mild pain (or Fever >/= 101).    . benazepril (LOTENSIN) 20 MG tablet TAKE 1 TABLET DAILY 90 tablet 1  . DULoxetine (CYMBALTA) 20 MG capsule TAKE 2 CAPSULES DAILY 180 capsule 0  . feeding supplement (BOOST HIGH PROTEIN) LIQD Take 1 Container by mouth daily.    . ferrous sulfate (FERROUSUL) 325 (65 FE) MG tablet Take 1 tablet (325 mg total) by mouth daily with breakfast. 90 tablet 3  . gabapentin (NEURONTIN) 300 MG capsule One pill in am; and 2 pills at night prior to sleep. 90 capsule 3  . hydrochlorothiazide (MICROZIDE) 12.5 MG capsule TAKE 1 CAPSULE DAILY 30 capsule 6  . HYDROcodone-acetaminophen (NORCO/VICODIN) 5-325 MG tablet Take 1 tablet by mouth every 12 (twelve) hours as needed for moderate pain. 60 tablet 0  . lidocaine-prilocaine (EMLA) cream Apply 1 application topically as needed. Apply generously over the Mediport 45 minutes prior to chemotherapy. 30 g 6  . loperamide (IMODIUM) 2 MG capsule Take 2 mg by mouth as needed for diarrhea or loose stools.    . Multiple Vitamins-Minerals (MULTIVITAMIN ADULT PO) Take 1 tablet by mouth daily.    . ondansetron (ZOFRAN) 8 MG tablet Take 1 tablet (8 mg total) by mouth every 8 (eight) hours as needed for nausea or vomiting (start 3 days; after chemo). 40 tablet 0  . potassium chloride SA (K-DUR,KLOR-CON) 20 MEQ tablet 1 pill twice a day 60 tablet 3  . prochlorperazine (COMPAZINE) 10 MG tablet Take 1 tablet (10 mg total) by mouth every 6 (six) hours as needed for nausea or vomiting. 90 tablet 1  . diphenoxylate-atropine (LOMOTIL) 2.5-0.025 MG tablet Take 1 tablet by mouth 4 (four) times daily as needed for diarrhea or loose stools. Take it along with immodium (Patient not taking: Reported on 12/31/2017) 40 tablet 4   No current  facility-administered medications for this visit.    Facility-Administered Medications Ordered in Other Visits  Medication Dose Route Frequency Provider Last Rate Last Dose  . bevacizumab (AVASTIN) 250 mg in sodium chloride 0.9 % 100 mL chemo infusion  5 mg/kg (Treatment Plan Recorded) Intravenous Once Charlaine Dalton R, MD      . fluorouracil (ADRUCIL) 3,500 mg in sodium chloride 0.9 % 80 mL chemo infusion  2,400 mg/m2 (Treatment Plan Recorded) Intravenous 1 day or 1 dose Charlaine Dalton R, MD      . heparin lock flush 100 unit/mL  500 Units Intravenous Once Cammie Sickle, MD      . irinotecan (CAMPTOSAR) 180 mg in dextrose 5 % 500 mL chemo infusion  120 mg/m2 (Treatment Plan Recorded) Intravenous Once Charlaine Dalton R, MD      . sodium chloride flush (NS) 0.9 % injection 10 mL  10 mL Intravenous PRN Jesper Stirewalt,  Elisha Headland, MD   10 mL at 10/04/15 0901  . sodium chloride flush (NS) 0.9 % injection 10 mL  10 mL Intravenous PRN Cammie Sickle, MD   10 mL at 07/02/17 1020  . sodium chloride flush (NS) 0.9 % injection 10 mL  10 mL Intravenous PRN Cammie Sickle, MD   10 mL at 01/14/18 0810    PHYSICAL EXAMINATION: ECOG PERFORMANCE STATUS: 1 - Symptomatic but completely ambulatory  BP 109/72 (BP Location: Left Arm, Patient Position: Sitting)   Pulse 69   Temp 97.9 F (36.6 C) (Tympanic)   Resp 16   Wt 105 lb 8 oz (47.9 kg)   LMP  (LMP Unknown) Comment: LMP MORE THAN 5 YRS  BMI 20.60 kg/m   Filed Weights   01/14/18 0848  Weight: 105 lb 8 oz (47.9 kg)    Physical Exam  Constitutional: She is oriented to person, place, and time and well-developed, well-nourished, and in no distress.  HENT:  Head: Normocephalic and atraumatic.  Mouth/Throat: Oropharynx is clear and moist. No oropharyngeal exudate.  Eyes: Pupils are equal, round, and reactive to light.  Neck: Normal range of motion. Neck supple.  Cardiovascular: Normal rate and regular rhythm.   Pulmonary/Chest: No respiratory distress. She has no wheezes.  Abdominal: Soft. Bowel sounds are normal. She exhibits no distension and no mass. There is no tenderness. There is no rebound and no guarding.  Musculoskeletal: Normal range of motion. She exhibits no edema or tenderness.  Neurological: She is alert and oriented to person, place, and time.  Skin: Skin is warm.  Psychiatric: Affect normal.       LABORATORY DATA:  I have reviewed the data as listed    Component Value Date/Time   NA 138 01/14/2018 0810   K 4.2 01/14/2018 0810   CL 111 01/14/2018 0810   CO2 21 (L) 01/14/2018 0810   GLUCOSE 93 01/14/2018 0810   BUN 13 01/14/2018 0810   CREATININE 1.02 (H) 01/14/2018 0810   CALCIUM 8.9 01/14/2018 0810   PROT 6.5 01/14/2018 0810   ALBUMIN 3.6 01/14/2018 0810   AST 14 (L) 01/14/2018 0810   ALT 11 01/14/2018 0810   ALKPHOS 93 01/14/2018 0810   BILITOT 0.4 01/14/2018 0810   GFRNONAA >60 01/14/2018 0810   GFRAA >60 01/14/2018 0810    No results found for: SPEP, UPEP  Lab Results  Component Value Date   WBC 7.0 01/14/2018   NEUTROABS 4.6 01/14/2018   HGB 12.1 01/14/2018   HCT 36.1 01/14/2018   MCV 93.0 01/14/2018   PLT 119 (L) 01/14/2018      Chemistry      Component Value Date/Time   NA 138 01/14/2018 0810   K 4.2 01/14/2018 0810   CL 111 01/14/2018 0810   CO2 21 (L) 01/14/2018 0810   BUN 13 01/14/2018 0810   CREATININE 1.02 (H) 01/14/2018 0810      Component Value Date/Time   CALCIUM 8.9 01/14/2018 0810   ALKPHOS 93 01/14/2018 0810   AST 14 (L) 01/14/2018 0810   ALT 11 01/14/2018 0810   BILITOT 0.4 01/14/2018 0810       RADIOGRAPHIC STUDIES: I have personally reviewed the radiological images as listed and agreed with the findings in the report. No results found.   ASSESSMENT & PLAN:  Cancer of ascending colon metastatic to intra-abdominal lymph node [facility]) # Colon cancer- cecal/ right-sided; STAGE IV; CT scan June 2019 shows worsening liver  mets; intra-abdominal metastases; question worsening  L2 bone lesion- sclerosis; MRI liver- increasing mets;  -currently restarted on 5FU+avastin [since July 2019].STABLE.   # proceed with FOLFIRI+ Avastin [reduced iri to 120 mg/m2]; Labs today reviewed;  acceptable for treatment today.   # Diarrhea-sec to iri-IMPROVED: Recommend Imodium/Lomotil for diarrhea.  # Hypokalemia- 4.2- STABLE.   # back pain-continue norco.  STABLE  #Follow-up in 2 weeks; FOLFIRI plus Avastin; UA labs. ADD CEA today; treatment today   Orders Placed This Encounter  Procedures  . CEA    Standing Status:   Future    Number of Occurrences:   1    Standing Expiration Date:   01/15/2019   All questions were answered. The patient knows to call the clinic with any problems, questions or concerns.      Cammie Sickle, MD 01/14/2018 10:16 AM

## 2018-01-15 LAB — CEA: CEA1: 13.4 ng/mL — AB (ref 0.0–4.7)

## 2018-01-16 ENCOUNTER — Inpatient Hospital Stay: Payer: BLUE CROSS/BLUE SHIELD

## 2018-01-16 VITALS — BP 107/71 | HR 78 | Temp 97.6°F | Resp 18

## 2018-01-16 DIAGNOSIS — C772 Secondary and unspecified malignant neoplasm of intra-abdominal lymph nodes: Principal | ICD-10-CM

## 2018-01-16 DIAGNOSIS — C182 Malignant neoplasm of ascending colon: Secondary | ICD-10-CM

## 2018-01-16 MED ORDER — HEPARIN SOD (PORK) LOCK FLUSH 100 UNIT/ML IV SOLN
500.0000 [IU] | Freq: Once | INTRAVENOUS | Status: AC | PRN
  Administered 2018-01-16: 500 [IU]
  Filled 2018-01-16: qty 5

## 2018-01-16 MED ORDER — SODIUM CHLORIDE 0.9% FLUSH
10.0000 mL | INTRAVENOUS | Status: DC | PRN
  Administered 2018-01-16: 10 mL
  Filled 2018-01-16: qty 10

## 2018-01-28 ENCOUNTER — Inpatient Hospital Stay: Payer: BLUE CROSS/BLUE SHIELD | Attending: Internal Medicine

## 2018-01-28 ENCOUNTER — Inpatient Hospital Stay (HOSPITAL_BASED_OUTPATIENT_CLINIC_OR_DEPARTMENT_OTHER): Payer: BLUE CROSS/BLUE SHIELD | Admitting: Internal Medicine

## 2018-01-28 ENCOUNTER — Inpatient Hospital Stay: Payer: BLUE CROSS/BLUE SHIELD

## 2018-01-28 ENCOUNTER — Encounter: Payer: Self-pay | Admitting: Internal Medicine

## 2018-01-28 ENCOUNTER — Other Ambulatory Visit: Payer: Self-pay

## 2018-01-28 VITALS — BP 107/67 | HR 73 | Temp 97.0°F | Resp 20 | Ht 60.0 in | Wt 104.4 lb

## 2018-01-28 DIAGNOSIS — M545 Low back pain: Secondary | ICD-10-CM | POA: Insufficient documentation

## 2018-01-28 DIAGNOSIS — G620 Drug-induced polyneuropathy: Secondary | ICD-10-CM

## 2018-01-28 DIAGNOSIS — E876 Hypokalemia: Secondary | ICD-10-CM | POA: Diagnosis not present

## 2018-01-28 DIAGNOSIS — C772 Secondary and unspecified malignant neoplasm of intra-abdominal lymph nodes: Secondary | ICD-10-CM | POA: Insufficient documentation

## 2018-01-28 DIAGNOSIS — Z87891 Personal history of nicotine dependence: Secondary | ICD-10-CM | POA: Insufficient documentation

## 2018-01-28 DIAGNOSIS — R197 Diarrhea, unspecified: Secondary | ICD-10-CM | POA: Insufficient documentation

## 2018-01-28 DIAGNOSIS — I10 Essential (primary) hypertension: Secondary | ICD-10-CM | POA: Diagnosis not present

## 2018-01-28 DIAGNOSIS — C787 Secondary malignant neoplasm of liver and intrahepatic bile duct: Secondary | ICD-10-CM | POA: Insufficient documentation

## 2018-01-28 DIAGNOSIS — C182 Malignant neoplasm of ascending colon: Secondary | ICD-10-CM | POA: Insufficient documentation

## 2018-01-28 DIAGNOSIS — Z79899 Other long term (current) drug therapy: Secondary | ICD-10-CM | POA: Insufficient documentation

## 2018-01-28 LAB — COMPREHENSIVE METABOLIC PANEL
ALBUMIN: 3.6 g/dL (ref 3.5–5.0)
ALT: 11 U/L (ref 0–44)
AST: 15 U/L (ref 15–41)
Alkaline Phosphatase: 84 U/L (ref 38–126)
Anion gap: 7 (ref 5–15)
BILIRUBIN TOTAL: 0.5 mg/dL (ref 0.3–1.2)
BUN: 9 mg/dL (ref 6–20)
CHLORIDE: 108 mmol/L (ref 98–111)
CO2: 22 mmol/L (ref 22–32)
Calcium: 8.6 mg/dL — ABNORMAL LOW (ref 8.9–10.3)
Creatinine, Ser: 0.84 mg/dL (ref 0.44–1.00)
GFR calc Af Amer: >60 mL/min (ref >60)
GFR calc non Af Amer: >60 mL/min (ref >60)
GLUCOSE: 96 mg/dL (ref 70–99)
POTASSIUM: 3.3 mmol/L — AB (ref 3.5–5.1)
Sodium: 137 mmol/L (ref 135–145)
Total Protein: 6.3 g/dL — ABNORMAL LOW (ref 6.5–8.1)

## 2018-01-28 LAB — CBC WITH DIFFERENTIAL/PLATELET
Basophils Absolute: 0 10*3/uL (ref 0–0.1)
Basophils Relative: 1 %
EOS PCT: 3 %
Eosinophils Absolute: 0.1 10*3/uL (ref 0–0.7)
HCT: 34.7 % — ABNORMAL LOW (ref 35.0–47.0)
Hemoglobin: 11.6 g/dL — ABNORMAL LOW (ref 12.0–16.0)
LYMPHS ABS: 0.9 10*3/uL — AB (ref 1.0–3.6)
LYMPHS PCT: 19 %
MCH: 31 pg (ref 26.0–34.0)
MCHC: 33.4 g/dL (ref 32.0–36.0)
MCV: 92.6 fL (ref 80.0–100.0)
MONO ABS: 0.6 10*3/uL (ref 0.2–0.9)
Monocytes Relative: 13 %
Neutro Abs: 3 10*3/uL (ref 1.4–6.5)
Neutrophils Relative %: 64 %
PLATELETS: 93 10*3/uL — AB (ref 150–440)
RBC: 3.74 MIL/uL — ABNORMAL LOW (ref 3.80–5.20)
RDW: 18.1 % — AB (ref 11.5–14.5)
WBC: 4.7 10*3/uL (ref 3.6–11.0)

## 2018-01-28 LAB — URINALYSIS, COMPLETE (UACMP) WITH MICROSCOPIC
Bacteria, UA: NONE SEEN
Bilirubin Urine: NEGATIVE
Glucose, UA: NEGATIVE mg/dL
Hgb urine dipstick: NEGATIVE
KETONES UR: NEGATIVE mg/dL
Leukocytes, UA: NEGATIVE
Nitrite: NEGATIVE
PH: 5 (ref 5.0–8.0)
PROTEIN: NEGATIVE mg/dL
Specific Gravity, Urine: 1.014 (ref 1.005–1.030)

## 2018-01-28 MED ORDER — HEPARIN SOD (PORK) LOCK FLUSH 100 UNIT/ML IV SOLN: 500.0000 [IU] | Freq: Once | INTRAVENOUS | Status: DC

## 2018-01-28 MED ORDER — SODIUM CHLORIDE 0.9 % IV SOLN
Freq: Once | INTRAVENOUS | Status: AC
  Administered 2018-01-28: 09:00:00 via INTRAVENOUS
  Filled 2018-01-28: qty 250

## 2018-01-28 MED ORDER — PALONOSETRON HCL INJECTION 0.25 MG/5ML
0.2500 mg | Freq: Once | INTRAVENOUS | Status: AC
  Administered 2018-01-28: 0.25 mg via INTRAVENOUS
  Filled 2018-01-28: qty 5

## 2018-01-28 MED ORDER — ATROPINE SULFATE 1 MG/ML IJ SOLN
0.5000 mg | Freq: Once | INTRAMUSCULAR | Status: AC | PRN
  Administered 2018-01-28: 0.5 mg via INTRAVENOUS
  Filled 2018-01-28: qty 1

## 2018-01-28 MED ORDER — SODIUM CHLORIDE 0.9 % IV SOLN
5.0000 mg/kg | Freq: Once | INTRAVENOUS | Status: AC
  Administered 2018-01-28: 250 mg via INTRAVENOUS
  Filled 2018-01-28: qty 10

## 2018-01-28 MED ORDER — IRINOTECAN HCL CHEMO INJECTION 100 MG/5ML
120.0000 mg/m2 | Freq: Once | INTRAVENOUS | Status: AC
  Administered 2018-01-28: 180 mg via INTRAVENOUS
  Filled 2018-01-28: qty 5

## 2018-01-28 MED ORDER — SODIUM CHLORIDE 0.9% FLUSH
10.0000 mL | INTRAVENOUS | Status: DC | PRN
  Administered 2018-01-28: 10 mL via INTRAVENOUS
  Filled 2018-01-28: qty 10

## 2018-01-28 MED ORDER — SODIUM CHLORIDE 0.9 % IV SOLN
2400.0000 mg/m2 | INTRAVENOUS | Status: DC
  Administered 2018-01-28: 3500 mg via INTRAVENOUS
  Filled 2018-01-28: qty 50

## 2018-01-28 MED ORDER — DEXAMETHASONE SODIUM PHOSPHATE 10 MG/ML IJ SOLN
10.0000 mg | Freq: Once | INTRAMUSCULAR | Status: AC
  Administered 2018-01-28: 10 mg via INTRAVENOUS
  Filled 2018-01-28: qty 1

## 2018-01-28 NOTE — Progress Notes (Signed)
[facility] OFFICE PROGRESS NOTE  Patient Care Team: Coral Spikes, DO as PCP - General (Family Medicine)  Cancer Staging No matching staging information was found for the patient.   Oncology History   # [facility] 2017- COLON CANCER- STAGE IV [s/p right hemi-colectomy; pT4apN2 (7/19LN)]; Pre-op CEA- [id number]  PET-  mesenteric adenopathy/mediastinal adenopathy/right-sided subclavicular lymph node.    April 17th -START FOLFOX; May 1st Add Avastin;   Aug 16th- Disc OX [sec PN]; con 5FU-Avastin; NOV 27th CT C/A/P- CR; except for 6m LUL; 5FU-Avastin q 3 W [Poor tolerance to FOLFOX].  # June/July 2019- Progression of Liver/Abd LN; July 2019- START FOLFIRI + Avastin  # March 2018- L2 uptake MET s/p RT [[facility]2018]; mid April X-geva   # MOLECULAR TESTING: K-ras-EXON 2- MUTATED; MSI- STABLE.   DIAGNOSIS: colon ca  STAGE:  IV       ;GOALS: pallaitive  CURRENT/MOST RECENT THERAPY: FOLFIRI+Avastin      Cancer of ascending colon metastatic to intra-abdominal lymph node (HCC)      INTERVAL HISTORY:  Nancy [id number].o.  female pleasant patient above history of metastatic adenocarcinoma of the colon currently on FOLFIRI plus Avastin is here for follow-up.  Patient has intermittent diarrhea up to 3-4 loose stools a day.  Improved on Imodium/Lomotil.  Appetite is fair.  Back pain is well controlled needing to take Norco as needed.  Review of Systems  Constitutional: Positive for malaise/fatigue. Negative for chills, diaphoresis, fever and weight loss.  HENT: Negative for nosebleeds and sore throat.   Eyes: Negative for double vision.  Respiratory: Negative for cough, hemoptysis, sputum production, shortness of breath and wheezing.   Cardiovascular: Negative for chest pain, palpitations, orthopnea and leg swelling.  Gastrointestinal: Negative for abdominal pain, blood in stool, constipation, diarrhea, heartburn, melena, nausea and vomiting.  Genitourinary: Negative  for dysuria, frequency and urgency.  Musculoskeletal: Positive for back pain. Negative for joint pain.  Skin: Negative.  Negative for itching and rash.  Neurological: Negative for dizziness, tingling, focal weakness, weakness and headaches.  Endo/Heme/Allergies: Does not bruise/bleed easily.  Psychiatric/Behavioral: Negative for depression. The patient is not nervous/anxious and does not have insomnia.       PAST MEDICAL HISTORY :  Past Medical History:  Diagnosis Date  . Chicken pox   . [place] cancer ([facility]    Partial colectomy 07/2015 + chemo tx's  . Hypertension   . Hypokalemia   . Menopause    > 5 yrs  . Peripheral neuropathy due to chemotherapy ([facility]     PAST SURGICAL HISTORY :   Past Surgical History:  Procedure Laterality Date  . BREAST BIOPSY Right    2007? pt unsure done by [facility]  . COLONOSCOPY    . COLONOSCOPY WITH PROPOFOL N/A 10/14/2015   Procedure: COLONOSCOPY WITH PROPOFOL;  Surgeon: PHulen Luster MD;  [facility]  Service: Endoscopy;  Laterality: N/A;  . COLOSTOMY REVISION Right 08/09/2015   Procedure: COLON RESECTION RIGHT;  Surgeon: RFlorene Glen MD;  Location: [facility] ORS;  Service: General;  Laterality: Right;  . ECTOPIC PREGNANCY SURGERY    . PORTACATH PLACEMENT N/A 09/01/2015   Procedure: INSERTION PORT-A-CATH;  Surgeon: RFlorene Glen MD;  Location: [facility] ORS;  Service: General;  Laterality: N/A;  . SHOULDER SURGERY Left     FAMILY HISTORY :   Family History  Problem Relation Age of Onset  . Hypertension Mother   . Arthritis Father   . Prostate cancer Father   .  Diabetes Maternal Grandmother   . Colon cancer Maternal Grandfather        colon    SOCIAL HISTORY:   Social History   Tobacco Use  . Smoking status: Former Smoker    Packs/day: 0.25    Years: 2.00    Pack years: 0.50  . Smokeless tobacco: Never Used  . Tobacco comment: recently quit  Substance Use Topics  . Alcohol use: No    Alcohol/week: 0.0 standard drinks  . Drug  use: No    ALLERGIES:  has No Known Allergies.  MEDICATIONS:  Current Outpatient Medications  Medication Sig Dispense Refill  . acetaminophen (TYLENOL) 325 MG tablet Take 2 tablets (650 mg total) by mouth every 6 (six) hours as needed for mild pain (or Fever >/= 101).    . benazepril (LOTENSIN) 20 MG tablet TAKE 1 TABLET DAILY 90 tablet 1  . DULoxetine (CYMBALTA) 20 MG capsule TAKE 2 CAPSULES DAILY 180 capsule 0  . feeding supplement (BOOST HIGH PROTEIN) LIQD Take 1 Container by mouth daily.    . ferrous sulfate (FERROUSUL) 325 (65 FE) MG tablet Take 1 tablet (325 mg total) by mouth daily with breakfast. 90 tablet 3  . gabapentin (NEURONTIN) 300 MG capsule One pill in am; and 2 pills at night prior to sleep. 90 capsule 3  . hydrochlorothiazide (MICROZIDE) 12.5 MG capsule TAKE 1 CAPSULE DAILY 30 capsule 6  . HYDROcodone-acetaminophen (NORCO/VICODIN) 5-325 MG tablet Take 1 tablet by mouth every 12 (twelve) hours as needed for moderate pain. 60 tablet 0  . lidocaine-prilocaine (EMLA) cream Apply 1 application topically as needed. Apply generously over the Mediport 45 minutes prior to chemotherapy. 30 g 6  . loperamide (IMODIUM) 2 MG capsule Take 2 mg by mouth as needed for diarrhea or loose stools.    . Multiple Vitamins-Minerals (MULTIVITAMIN ADULT PO) Take 1 tablet by mouth daily.    . ondansetron (ZOFRAN) 8 MG tablet Take 1 tablet (8 mg total) by mouth every 8 (eight) hours as needed for nausea or vomiting (start 3 days; after chemo). 40 tablet 0  . potassium chloride SA (K-DUR,KLOR-CON) 20 MEQ tablet 1 pill twice a day 60 tablet 3  . prochlorperazine (COMPAZINE) 10 MG tablet Take 1 tablet (10 mg total) by mouth every 6 (six) hours as needed for nausea or vomiting. 90 tablet 1  . diphenoxylate-atropine (LOMOTIL) 2.5-0.025 MG tablet Take 1 tablet by mouth 4 (four) times daily as needed for diarrhea or loose stools. Take it along with immodium (Patient not taking: Reported on 12/31/2017) 40 tablet  4   No current facility-administered medications for this visit.    Facility-Administered Medications Ordered in Other Visits  Medication Dose Route Frequency Provider Last Rate Last Dose  . heparin lock flush 100 unit/mL  500 Units Intravenous Once Charlaine Dalton R, MD      . sodium chloride flush (NS) 0.9 % injection 10 mL  10 mL Intravenous PRN Cammie Sickle, MD   10 mL at 10/04/15 0901  . sodium chloride flush (NS) 0.9 % injection 10 mL  10 mL Intravenous PRN Cammie Sickle, MD   10 mL at 07/02/17 1020  . sodium chloride flush (NS) 0.9 % injection 10 mL  10 mL Intravenous PRN Cammie Sickle, MD   10 mL at 01/28/18 0824    PHYSICAL EXAMINATION: ECOG PERFORMANCE STATUS: 1 - Symptomatic but completely ambulatory  BP 107/67 (BP Location: Left Arm, Patient Position: Sitting)   Pulse 73  Temp (!) 97 F (36.1 C) (Tympanic)   Resp 20   Ht 5' (1.524 m)   Wt 104 lb 6.4 oz (47.4 kg)   LMP  (LMP Unknown) Comment: LMP MORE THAN 5 YRS  BMI 20.39 kg/m   Filed Weights   01/28/18 0840  Weight: 104 lb 6.4 oz (47.4 kg)    Physical Exam  Constitutional: She is oriented to person, place, and time and well-developed, well-nourished, and in no distress.  She is accompanied by mother.  She is walking herself.  HENT:  Head: Normocephalic and atraumatic.  Mouth/Throat: Oropharynx is clear and moist. No oropharyngeal exudate.  Eyes: Pupils are equal, round, and reactive to light.  Neck: Normal range of motion. Neck supple.  Cardiovascular: Normal rate and regular rhythm.  Pulmonary/Chest: No respiratory distress. She has no wheezes.  Abdominal: Soft. Bowel sounds are normal. She exhibits no distension and no mass. There is no tenderness. There is no rebound and no guarding.  Musculoskeletal: Normal range of motion. She exhibits no edema or tenderness.  Neurological: She is alert and oriented to person, place, and time.  Skin: Skin is warm.  Psychiatric: Affect  normal.       LABORATORY DATA:  I have reviewed the data as listed    Component Value Date/Time   NA 137 01/28/2018 0824   K 3.3 (L) 01/28/2018 0824   CL 108 01/28/2018 0824   CO2 22 01/28/2018 0824   GLUCOSE 96 01/28/2018 0824   BUN 9 01/28/2018 0824   CREATININE 0.84 01/28/2018 0824   CALCIUM 8.6 (L) 01/28/2018 0824   PROT 6.3 (L) 01/28/2018 0824   ALBUMIN 3.6 01/28/2018 0824   AST 15 01/28/2018 0824   ALT 11 01/28/2018 0824   ALKPHOS 84 01/28/2018 0824   BILITOT 0.5 01/28/2018 0824   GFRNONAA >60 01/28/2018 0824   GFRAA >60 01/28/2018 0824    No results found for: SPEP, UPEP  Lab Results  Component Value Date   WBC 4.7 01/28/2018   NEUTROABS 3.0 01/28/2018   HGB 11.6 (L) 01/28/2018   HCT 34.7 (L) 01/28/2018   MCV 92.6 01/28/2018   PLT 93 (L) 01/28/2018      Chemistry      Component Value Date/Time   NA 137 01/28/2018 0824   K 3.3 (L) 01/28/2018 0824   CL 108 01/28/2018 0824   CO2 22 01/28/2018 0824   BUN 9 01/28/2018 0824   CREATININE 0.84 01/28/2018 0824      Component Value Date/Time   CALCIUM 8.6 (L) 01/28/2018 0824   ALKPHOS 84 01/28/2018 0824   AST 15 01/28/2018 0824   ALT 11 01/28/2018 0824   BILITOT 0.5 01/28/2018 0824       RADIOGRAPHIC STUDIES: I have personally reviewed the radiological images as listed and agreed with the findings in the report. No results found.   ASSESSMENT & PLAN:  Cancer of ascending colon metastatic to intra-abdominal lymph node [facility]) # Colon cancer- cecal/ right-sided; STAGE IV; CT scan June 2019 shows worsening liver mets; intra-abdominal metastases; MRI liver- increasing mets;  -currently  on FOLFIRI + avastin [since July 2019].STABLE.   # proceed with FOLFIRI+ Avastin [reduced iri to 120 mg/m2]; Labs today reviewed;  acceptable for treatment today except platelets 93.   # Diarrhea-sec to iri-: STABLE. continue Imodium/Lomotil for diarrhea.  # Hypokalemia-3.3; on K sup; Continue STABLE. .   # back  pain-continue norco prn.  STABLE.   #Follow-up in 2 weeks; FOLFIRI plus Avastin; UA labs.  treatment today.    No orders of the defined types were placed in this encounter.  All questions were answered. The patient knows to call the clinic with any problems, questions or concerns.      Cammie Sickle, MD 01/28/2018 9:09 AM

## 2018-01-28 NOTE — Assessment & Plan Note (Addendum)
#   Colon cancer- cecal/ right-sided; STAGE IV; CT scan June 2019 shows worsening liver mets; intra-abdominal metastases; MRI liver- increasing mets;  -currently  on FOLFIRI + avastin [since July 2019].STABLE.   # proceed with FOLFIRI+ Avastin [reduced iri to 120 mg/m2]; Labs today reviewed;  acceptable for treatment today except platelets 93.   # Diarrhea-sec to iri-: STABLE. continue Imodium/Lomotil for diarrhea.  # Hypokalemia-3.3; on K sup; Continue STABLE. .   # back pain-continue norco prn.  STABLE.   #Follow-up in 2 weeks; FOLFIRI plus Avastin; UA labs. treatment today.

## 2018-01-30 ENCOUNTER — Inpatient Hospital Stay: Payer: BLUE CROSS/BLUE SHIELD

## 2018-01-30 VITALS — BP 115/71 | HR 72 | Temp 96.2°F | Resp 20

## 2018-01-30 DIAGNOSIS — C182 Malignant neoplasm of ascending colon: Secondary | ICD-10-CM

## 2018-01-30 DIAGNOSIS — C772 Secondary and unspecified malignant neoplasm of intra-abdominal lymph nodes: Principal | ICD-10-CM

## 2018-01-30 MED ORDER — HEPARIN SOD (PORK) LOCK FLUSH 100 UNIT/ML IV SOLN
500.0000 [IU] | Freq: Once | INTRAVENOUS | Status: AC | PRN
  Administered 2018-01-30: 500 [IU]

## 2018-01-30 MED ORDER — SODIUM CHLORIDE 0.9% FLUSH
10.0000 mL | INTRAVENOUS | Status: DC | PRN
  Administered 2018-01-30: 10 mL
  Filled 2018-01-30: qty 10

## 2018-02-04 ENCOUNTER — Ambulatory Visit: Payer: BLUE CROSS/BLUE SHIELD | Admitting: Internal Medicine

## 2018-02-04 ENCOUNTER — Other Ambulatory Visit: Payer: BLUE CROSS/BLUE SHIELD

## 2018-02-11 ENCOUNTER — Inpatient Hospital Stay (HOSPITAL_BASED_OUTPATIENT_CLINIC_OR_DEPARTMENT_OTHER): Payer: BLUE CROSS/BLUE SHIELD | Admitting: Internal Medicine

## 2018-02-11 ENCOUNTER — Encounter: Payer: Self-pay | Admitting: Internal Medicine

## 2018-02-11 ENCOUNTER — Inpatient Hospital Stay: Payer: BLUE CROSS/BLUE SHIELD

## 2018-02-11 VITALS — BP 99/67 | HR 86 | Resp 16

## 2018-02-11 DIAGNOSIS — I10 Essential (primary) hypertension: Secondary | ICD-10-CM

## 2018-02-11 DIAGNOSIS — C772 Secondary and unspecified malignant neoplasm of intra-abdominal lymph nodes: Principal | ICD-10-CM

## 2018-02-11 DIAGNOSIS — C182 Malignant neoplasm of ascending colon: Secondary | ICD-10-CM

## 2018-02-11 DIAGNOSIS — Z87891 Personal history of nicotine dependence: Secondary | ICD-10-CM

## 2018-02-11 DIAGNOSIS — E876 Hypokalemia: Secondary | ICD-10-CM

## 2018-02-11 DIAGNOSIS — G629 Polyneuropathy, unspecified: Secondary | ICD-10-CM | POA: Diagnosis not present

## 2018-02-11 DIAGNOSIS — C787 Secondary malignant neoplasm of liver and intrahepatic bile duct: Secondary | ICD-10-CM | POA: Diagnosis not present

## 2018-02-11 DIAGNOSIS — R197 Diarrhea, unspecified: Secondary | ICD-10-CM

## 2018-02-11 LAB — CBC WITH DIFFERENTIAL/PLATELET
BASOS ABS: 0.1 10*3/uL (ref 0–0.1)
BASOS PCT: 1 %
EOS ABS: 0.1 10*3/uL (ref 0–0.7)
EOS PCT: 1 %
HCT: 36.4 % (ref 35.0–47.0)
Hemoglobin: 12.4 g/dL (ref 12.0–16.0)
Lymphocytes Relative: 22 %
Lymphs Abs: 1.4 10*3/uL (ref 1.0–3.6)
MCH: 31.5 pg (ref 26.0–34.0)
MCHC: 33.9 g/dL (ref 32.0–36.0)
MCV: 92.8 fL (ref 80.0–100.0)
MONO ABS: 0.6 10*3/uL (ref 0.2–0.9)
Monocytes Relative: 9 %
Neutro Abs: 4.3 10*3/uL (ref 1.4–6.5)
Neutrophils Relative %: 67 %
PLATELETS: 133 10*3/uL — AB (ref 150–440)
RBC: 3.92 MIL/uL (ref 3.80–5.20)
RDW: 18.1 % — AB (ref 11.5–14.5)
WBC: 6.3 10*3/uL (ref 3.6–11.0)

## 2018-02-11 LAB — URINALYSIS, COMPLETE (UACMP) WITH MICROSCOPIC
BILIRUBIN URINE: NEGATIVE
Bacteria, UA: NONE SEEN
GLUCOSE, UA: NEGATIVE mg/dL
HGB URINE DIPSTICK: NEGATIVE
KETONES UR: NEGATIVE mg/dL
Leukocytes, UA: NEGATIVE
Nitrite: NEGATIVE
PH: 5 (ref 5.0–8.0)
Protein, ur: NEGATIVE mg/dL
SPECIFIC GRAVITY, URINE: 1.012 (ref 1.005–1.030)

## 2018-02-11 LAB — COMPREHENSIVE METABOLIC PANEL
ALK PHOS: 96 U/L (ref 38–126)
ALT: 12 U/L (ref 0–44)
AST: 17 U/L (ref 15–41)
Albumin: 3.6 g/dL (ref 3.5–5.0)
Anion gap: 6 (ref 5–15)
BUN: 11 mg/dL (ref 6–20)
CALCIUM: 8.8 mg/dL — AB (ref 8.9–10.3)
CO2: 22 mmol/L (ref 22–32)
CREATININE: 0.84 mg/dL (ref 0.44–1.00)
Chloride: 109 mmol/L (ref 98–111)
GFR calc Af Amer: >60 mL/min (ref >60)
GFR calc non Af Amer: >60 mL/min (ref >60)
GLUCOSE: 103 mg/dL — AB (ref 70–99)
Potassium: 3.7 mmol/L (ref 3.5–5.1)
Sodium: 137 mmol/L (ref 135–145)
TOTAL PROTEIN: 6.7 g/dL (ref 6.5–8.1)
Total Bilirubin: 0.4 mg/dL (ref 0.3–1.2)

## 2018-02-11 MED ORDER — ATROPINE SULFATE 1 MG/ML IJ SOLN
0.5000 mg | Freq: Once | INTRAMUSCULAR | Status: AC | PRN
  Administered 2018-02-11: 0.5 mg via INTRAVENOUS
  Filled 2018-02-11: qty 1

## 2018-02-11 MED ORDER — SODIUM CHLORIDE 0.9 % IV SOLN
Freq: Once | INTRAVENOUS | Status: AC
  Administered 2018-02-11: 10:00:00 via INTRAVENOUS
  Filled 2018-02-11: qty 250

## 2018-02-11 MED ORDER — DULOXETINE HCL 20 MG PO CPEP: 40.0000 mg | ORAL_CAPSULE | Freq: Every day | ORAL | 0 refills | Status: DC

## 2018-02-11 MED ORDER — SODIUM CHLORIDE 0.9 % IV SOLN
5.0000 mg/kg | Freq: Once | INTRAVENOUS | Status: AC
  Administered 2018-02-11: 250 mg via INTRAVENOUS
  Filled 2018-02-11: qty 10

## 2018-02-11 MED ORDER — SODIUM CHLORIDE 0.9 % IV SOLN
2400.0000 mg/m2 | INTRAVENOUS | Status: DC
  Administered 2018-02-11: 3500 mg via INTRAVENOUS
  Filled 2018-02-11: qty 20

## 2018-02-11 MED ORDER — GABAPENTIN 300 MG PO CAPS: ORAL_CAPSULE | ORAL | 3 refills | Status: DC

## 2018-02-11 MED ORDER — LIDOCAINE 5 % EX PTCH: 1.0000 | MEDICATED_PATCH | CUTANEOUS | 2 refills | Status: DC

## 2018-02-11 MED ORDER — DEXAMETHASONE SODIUM PHOSPHATE 10 MG/ML IJ SOLN
10.0000 mg | Freq: Once | INTRAMUSCULAR | Status: AC
  Administered 2018-02-11: 10 mg via INTRAVENOUS
  Filled 2018-02-11: qty 1

## 2018-02-11 MED ORDER — PALONOSETRON HCL INJECTION 0.25 MG/5ML
0.2500 mg | Freq: Once | INTRAVENOUS | Status: AC
  Administered 2018-02-11: 0.25 mg via INTRAVENOUS
  Filled 2018-02-11: qty 5

## 2018-02-11 MED ORDER — IRINOTECAN HCL CHEMO INJECTION 100 MG/5ML
120.0000 mg/m2 | Freq: Once | INTRAVENOUS | Status: AC
  Administered 2018-02-11: 180 mg via INTRAVENOUS
  Filled 2018-02-11: qty 5

## 2018-02-11 NOTE — Progress Notes (Signed)
[place] OFFICE PROGRESS NOTE  Patient Care Team: Coral Spikes, DO as PCP - General (Family Medicine)  Cancer Staging No matching staging information was found for the patient.   Oncology History   # [facility] 2017- COLON CANCER- STAGE IV [s/p right hemi-colectomy; pT4apN2 (7/19LN)]; Pre-op CEA- [id number]  PET-  mesenteric adenopathy/mediastinal adenopathy/right-sided subclavicular lymph node.    April 17th -START FOLFOX; May 1st Add Avastin;   Aug 16th- Disc OX [sec PN]; con 5FU-Avastin; NOV 27th CT C/A/P- CR; except for 62m LUL; 5FU-Avastin q 3 W [Poor tolerance to FOLFOX].  # June/July 2019- Progression of Liver/Abd LN; July 2019- START FOLFIRI + Avastin  # March 2018- L2 uptake MET s/p RT [[facility]2018]; mid April X-geva   # MOLECULAR TESTING: K-ras-EXON 2- MUTATED; MSI- STABLE.   DIAGNOSIS: colon ca  STAGE:  IV       ;GOALS: pallaitive  CURRENT/MOST RECENT THERAPY: FOLFIRI+Avastin      Cancer of ascending colon metastatic to intra-abdominal lymph node (HCC)      INTERVAL HISTORY:  Nancy FUNCHES89y.o.  female pleasant patient above history of metastatic adenocarcinoma of the colon currently on FOLFIRI plus Avastin is here for follow-up.  Patient denies any diarrhea.  Denies any nausea vomiting.  Appetite is good.  Gaining weight.  She continues to be on Neurontin/Cymbalta for her neuropathy.  Mild tingling and numbness.  Her back pain is stable.  Continues to be on Lidoderm patch.  Review of Systems  Constitutional: Positive for malaise/fatigue. Negative for chills, diaphoresis, fever and weight loss.  HENT: Negative for nosebleeds and sore throat.   Eyes: Negative for double vision.  Respiratory: Negative for cough, hemoptysis, sputum production, shortness of breath and wheezing.   Cardiovascular: Negative for chest pain, palpitations, orthopnea and leg swelling.  Gastrointestinal: Negative for abdominal pain, blood in stool, constipation,  diarrhea, heartburn, melena, nausea and vomiting.  Genitourinary: Negative for dysuria, frequency and urgency.  Musculoskeletal: Positive for back pain. Negative for joint pain.  Skin: Negative.  Negative for itching and rash.  Neurological: Positive for tingling. Negative for dizziness, focal weakness, weakness and headaches.  Endo/Heme/Allergies: Does not bruise/bleed easily.  Psychiatric/Behavioral: Negative for depression. The patient is not nervous/anxious and does not have insomnia.       PAST MEDICAL HISTORY :  Past Medical History:  Diagnosis Date  . Chicken pox   . [place] cancer [place]    Partial colectomy 07/2015 + chemo tx's  . Hypertension   . Hypokalemia   . Menopause    > 5 yrs  . Peripheral neuropathy due to chemotherapy ([facility]     PAST SURGICAL HISTORY :   Past Surgical History:  Procedure Laterality Date  . BREAST BIOPSY Right    2007? pt unsure done by [facility]  . COLONOSCOPY    . COLONOSCOPY WITH PROPOFOL N/A 10/14/2015   Procedure: COLONOSCOPY WITH PROPOFOL;  Surgeon: PHulen Luster MD;  [facility]  Service: Endoscopy;  Laterality: N/A;  . COLOSTOMY REVISION Right 08/09/2015   Procedure: COLON RESECTION RIGHT;  Surgeon: RFlorene Glen MD;  Location: [facility] ORS;  Service: General;  Laterality: Right;  . ECTOPIC PREGNANCY SURGERY    . PORTACATH PLACEMENT N/A 09/01/2015   Procedure: INSERTION PORT-A-CATH;  Surgeon: RFlorene Glen MD;  Location: [facility] ORS;  Service: General;  Laterality: N/A;  . SHOULDER SURGERY Left     FAMILY HISTORY :   Family History  Problem Relation Age of Onset  .  Hypertension Mother   . Arthritis Father   . Prostate cancer Father   . Diabetes Maternal Grandmother   . Colon cancer Maternal Grandfather        colon    SOCIAL HISTORY:   Social History   Tobacco Use  . Smoking status: Former Smoker    Packs/day: 0.25    Years: 2.00    Pack years: 0.50  . Smokeless tobacco: Never Used  . Tobacco comment: recently quit   Substance Use Topics  . Alcohol use: No    Alcohol/week: 0.0 standard drinks  . Drug use: No    ALLERGIES:  has No Known Allergies.  MEDICATIONS:  Current Outpatient Medications  Medication Sig Dispense Refill  . acetaminophen (TYLENOL) 325 MG tablet Take 2 tablets (650 mg total) by mouth every 6 (six) hours as needed for mild pain (or Fever >/= 101).    . benazepril (LOTENSIN) 20 MG tablet TAKE 1 TABLET DAILY 90 tablet 1  . diphenoxylate-atropine (LOMOTIL) 2.5-0.025 MG tablet Take 1 tablet by mouth 4 (four) times daily as needed for diarrhea or loose stools. Take it along with immodium 40 tablet 4  . DULoxetine (CYMBALTA) 20 MG capsule Take 2 capsules (40 mg total) by mouth daily. 180 capsule 0  . feeding supplement (BOOST HIGH PROTEIN) LIQD Take 1 Container by mouth daily.    . ferrous sulfate (FERROUSUL) 325 (65 FE) MG tablet Take 1 tablet (325 mg total) by mouth daily with breakfast. 90 tablet 3  . gabapentin (NEURONTIN) 300 MG capsule One pill in am; and 2 pills at night prior to sleep. 90 capsule 3  . hydrochlorothiazide (MICROZIDE) 12.5 MG capsule TAKE 1 CAPSULE DAILY 30 capsule 6  . HYDROcodone-acetaminophen (NORCO/VICODIN) 5-325 MG tablet Take 1 tablet by mouth every 12 (twelve) hours as needed for moderate pain. 60 tablet 0  . lidocaine-prilocaine (EMLA) cream Apply 1 application topically as needed. Apply generously over the Mediport 45 minutes prior to chemotherapy. 30 g 6  . loperamide (IMODIUM) 2 MG capsule Take 2 mg by mouth as needed for diarrhea or loose stools.    . Multiple Vitamins-Minerals (MULTIVITAMIN ADULT PO) Take 1 tablet by mouth daily.    . ondansetron (ZOFRAN) 8 MG tablet Take 1 tablet (8 mg total) by mouth every 8 (eight) hours as needed for nausea or vomiting (start 3 days; after chemo). 40 tablet 0  . potassium chloride SA (K-DUR,KLOR-CON) 20 MEQ tablet 1 pill twice a day 60 tablet 3  . prochlorperazine (COMPAZINE) 10 MG tablet Take 1 tablet (10 mg total) by  mouth every 6 (six) hours as needed for nausea or vomiting. 90 tablet 1  . lidocaine (LIDODERM) 5 % Place 1 patch onto the skin daily. Remove & Discard patch within 12 hours or as directed by MD 30 patch 2   No current facility-administered medications for this visit.    Facility-Administered Medications Ordered in Other Visits  Medication Dose Route Frequency Provider Last Rate Last Dose  . sodium chloride flush (NS) 0.9 % injection 10 mL  10 mL Intravenous PRN Cammie Sickle, MD   10 mL at 10/04/15 0901  . sodium chloride flush (NS) 0.9 % injection 10 mL  10 mL Intravenous PRN Charlaine Dalton R, MD   10 mL at 07/02/17 1020    PHYSICAL EXAMINATION: ECOG PERFORMANCE STATUS: 1 - Symptomatic but completely ambulatory  BP 105/71 (BP Location: Left Arm, Patient Position: Sitting)   Pulse 82   Temp 97.9 F (36.6  C) (Tympanic)   Resp 16   Wt 101 lb 6.4 oz (46 kg)   LMP  (LMP Unknown) Comment: LMP MORE THAN 5 YRS  BMI 19.80 kg/m   Filed Weights   02/11/18 0841  Weight: 101 lb 6.4 oz (46 kg)    Physical Exam  Constitutional: She is oriented to person, place, and time and well-developed, well-nourished, and in no distress.  She is accompanied by mother.  She is walking herself.  HENT:  Head: Normocephalic and atraumatic.  Mouth/Throat: Oropharynx is clear and moist. No oropharyngeal exudate.  Eyes: Pupils are equal, round, and reactive to light.  Neck: Normal range of motion. Neck supple.  Cardiovascular: Normal rate and regular rhythm.  Pulmonary/Chest: No respiratory distress. She has no wheezes.  Abdominal: Soft. Bowel sounds are normal. She exhibits no distension and no mass. There is no tenderness. There is no rebound and no guarding.  Musculoskeletal: Normal range of motion. She exhibits no edema or tenderness.  Neurological: She is alert and oriented to person, place, and time.  Skin: Skin is warm.  Psychiatric: Affect normal.       LABORATORY DATA:  I have  reviewed the data as listed    Component Value Date/Time   NA 137 02/11/2018 0804   K 3.7 02/11/2018 0804   CL 109 02/11/2018 0804   CO2 22 02/11/2018 0804   GLUCOSE 103 (H) 02/11/2018 0804   BUN 11 02/11/2018 0804   CREATININE 0.84 02/11/2018 0804   CALCIUM 8.8 (L) 02/11/2018 0804   PROT 6.7 02/11/2018 0804   ALBUMIN 3.6 02/11/2018 0804   AST 17 02/11/2018 0804   ALT 12 02/11/2018 0804   ALKPHOS 96 02/11/2018 0804   BILITOT 0.4 02/11/2018 0804   GFRNONAA >60 02/11/2018 0804   GFRAA >60 02/11/2018 0804    No results found for: SPEP, UPEP  Lab Results  Component Value Date   WBC 6.3 02/11/2018   NEUTROABS 4.3 02/11/2018   HGB 12.4 02/11/2018   HCT 36.4 02/11/2018   MCV 92.8 02/11/2018   PLT 133 (L) 02/11/2018      Chemistry      Component Value Date/Time   NA 137 02/11/2018 0804   K 3.7 02/11/2018 0804   CL 109 02/11/2018 0804   CO2 22 02/11/2018 0804   BUN 11 02/11/2018 0804   CREATININE 0.84 02/11/2018 0804      Component Value Date/Time   CALCIUM 8.8 (L) 02/11/2018 0804   ALKPHOS 96 02/11/2018 0804   AST 17 02/11/2018 0804   ALT 12 02/11/2018 0804   BILITOT 0.4 02/11/2018 0804       RADIOGRAPHIC STUDIES: I have personally reviewed the radiological images as listed and agreed with the findings in the report. No results found.   ASSESSMENT & PLAN:  Cancer of ascending colon metastatic to intra-abdominal lymph node [facility]) # Colon cancer- cecal/ right-sided; STAGE IV; CT scan June 2019 shows worsening liver mets; intra-abdominal metastases; MRI liver- increasing mets;  -currently  on FOLFIRI + avastin [since July 2019].  CEA rising-at 13.   # proceed with FOLFIRI+ Avastin [reduced iri to 120 mg/m2]; Labs today reviewed;  acceptable for treatment today.  We will plan to get a CT scan of the abdomen pelvis prior to next treatment.  # Diarrhea-sec to iri-: Improved.  # Hypokalemia-improved.  Continue potassium supplement.  # back pain-continue norco prn.   Stable.  New prescription for Lidoderm patch given.  #Peripheral neuropathy grade 2-continue gabapentin plus Cymbalta.  #  Follow-up in 2 weeks; FOLFIRI plus Avastin; UA labs. treatment today; CT A/P prior   Orders Placed This Encounter  Procedures  . CT Abdomen Pelvis W Contrast    Standing Status:   Future    Standing Expiration Date:   02/11/2019    Order Specific Question:   ** REASON FOR EXAM (FREE TEXT)    Answer:   colon cancer with liver lesions; on chemo    Order Specific Question:   If indicated for the ordered procedure, I authorize the administration of contrast media per Radiology protocol    Answer:   Yes    Order Specific Question:   Is patient pregnant?    Answer:   No    Order Specific Question:   Preferred imaging location?    Answer:   [facility]    Order Specific Question:   Is Oral Contrast requested for this exam?    Answer:   Yes, Per Radiology protocol    Order Specific Question:   Radiology Contrast Protocol - do NOT remove file path    Answer:   [email or web address]  . CEA    Standing Status:   Future    Standing Expiration Date:   02/12/2019   All questions were answered. The patient knows to call the clinic with any problems, questions or concerns.      Cammie Sickle, MD 02/11/2018 9:15 AM

## 2018-02-11 NOTE — Assessment & Plan Note (Signed)
#   Colon cancer- cecal/ right-sided; STAGE IV; CT scan June 2019 shows worsening liver mets; intra-abdominal metastases; MRI liver- increasing mets;  -currently  on FOLFIRI + avastin [since July 2019].  CEA rising-at 13.   # proceed with FOLFIRI+ Avastin [reduced iri to 120 mg/m2]; Labs today reviewed;  acceptable for treatment today.  We will plan to get a CT scan of the abdomen pelvis prior to next treatment.  # Diarrhea-sec to iri-: Improved.  # Hypokalemia-improved.  Continue potassium supplement.  # back pain-continue norco prn.  Stable.  New prescription for Lidoderm patch given.  #Peripheral neuropathy grade 2-continue gabapentin plus Cymbalta.  #Follow-up in 2 weeks; FOLFIRI plus Avastin; UA labs. treatment today; CT A/P prior

## 2018-02-11 NOTE — Progress Notes (Signed)
Per Dr. Rogue Bussing okay to proceed with Avastin prior to 02/11/18 UA results, based on 01/28/18 UA results.

## 2018-02-12 LAB — CEA: CEA1: 10.8 ng/mL — AB (ref 0.0–4.7)

## 2018-02-13 ENCOUNTER — Inpatient Hospital Stay: Payer: BLUE CROSS/BLUE SHIELD

## 2018-02-13 VITALS — BP 110/72 | HR 71 | Temp 96.8°F | Resp 20

## 2018-02-13 DIAGNOSIS — C182 Malignant neoplasm of ascending colon: Secondary | ICD-10-CM | POA: Diagnosis not present

## 2018-02-13 DIAGNOSIS — C772 Secondary and unspecified malignant neoplasm of intra-abdominal lymph nodes: Principal | ICD-10-CM

## 2018-02-13 MED ORDER — SODIUM CHLORIDE 0.9% FLUSH
10.0000 mL | INTRAVENOUS | Status: DC | PRN
  Administered 2018-02-13: 10 mL
  Filled 2018-02-13: qty 10

## 2018-02-13 MED ORDER — HEPARIN SOD (PORK) LOCK FLUSH 100 UNIT/ML IV SOLN
500.0000 [IU] | Freq: Once | INTRAVENOUS | Status: AC | PRN
  Administered 2018-02-13: 500 [IU]
  Filled 2018-02-13: qty 5

## 2018-02-22 ENCOUNTER — Ambulatory Visit
Admission: RE | Admit: 2018-02-22 | Discharge: 2018-02-22 | Disposition: A | Discharge status: Home / Self Care | Payer: BLUE CROSS/BLUE SHIELD | Source: Ambulatory Visit | Attending: Internal Medicine | Admitting: Internal Medicine

## 2018-02-22 DIAGNOSIS — C772 Secondary and unspecified malignant neoplasm of intra-abdominal lymph nodes: Secondary | ICD-10-CM | POA: Insufficient documentation

## 2018-02-22 DIAGNOSIS — C182 Malignant neoplasm of ascending colon: Secondary | ICD-10-CM | POA: Diagnosis present

## 2018-02-22 MED ORDER — IOPAMIDOL (ISOVUE-300) INJECTION 61%
75.0000 mL | Freq: Once | INTRAVENOUS | Status: AC | PRN
  Administered 2018-02-22: 75 mL via INTRAVENOUS

## 2018-02-25 ENCOUNTER — Other Ambulatory Visit: Payer: Self-pay

## 2018-02-25 ENCOUNTER — Inpatient Hospital Stay: Payer: BLUE CROSS/BLUE SHIELD | Attending: Internal Medicine

## 2018-02-25 ENCOUNTER — Other Ambulatory Visit: Payer: Self-pay | Admitting: Internal Medicine

## 2018-02-25 ENCOUNTER — Inpatient Hospital Stay: Payer: BLUE CROSS/BLUE SHIELD

## 2018-02-25 ENCOUNTER — Encounter: Payer: Self-pay | Admitting: Internal Medicine

## 2018-02-25 ENCOUNTER — Inpatient Hospital Stay (HOSPITAL_BASED_OUTPATIENT_CLINIC_OR_DEPARTMENT_OTHER): Payer: BLUE CROSS/BLUE SHIELD | Admitting: Internal Medicine

## 2018-02-25 ENCOUNTER — Other Ambulatory Visit: Payer: Self-pay | Admitting: *Deleted

## 2018-02-25 VITALS — BP 110/70 | HR 80 | Temp 97.7°F | Resp 18 | Ht 60.0 in | Wt 101.1 lb

## 2018-02-25 DIAGNOSIS — C182 Malignant neoplasm of ascending colon: Secondary | ICD-10-CM | POA: Insufficient documentation

## 2018-02-25 DIAGNOSIS — C787 Secondary malignant neoplasm of liver and intrahepatic bile duct: Secondary | ICD-10-CM | POA: Diagnosis not present

## 2018-02-25 DIAGNOSIS — Z87891 Personal history of nicotine dependence: Secondary | ICD-10-CM | POA: Diagnosis not present

## 2018-02-25 DIAGNOSIS — G8929 Other chronic pain: Secondary | ICD-10-CM | POA: Insufficient documentation

## 2018-02-25 DIAGNOSIS — E876 Hypokalemia: Secondary | ICD-10-CM

## 2018-02-25 DIAGNOSIS — I10 Essential (primary) hypertension: Secondary | ICD-10-CM

## 2018-02-25 DIAGNOSIS — C772 Secondary and unspecified malignant neoplasm of intra-abdominal lymph nodes: Secondary | ICD-10-CM | POA: Diagnosis not present

## 2018-02-25 DIAGNOSIS — Z79899 Other long term (current) drug therapy: Secondary | ICD-10-CM | POA: Insufficient documentation

## 2018-02-25 DIAGNOSIS — Z5111 Encounter for antineoplastic chemotherapy: Secondary | ICD-10-CM | POA: Insufficient documentation

## 2018-02-25 DIAGNOSIS — G629 Polyneuropathy, unspecified: Secondary | ICD-10-CM | POA: Insufficient documentation

## 2018-02-25 DIAGNOSIS — Z5112 Encounter for antineoplastic immunotherapy: Secondary | ICD-10-CM | POA: Diagnosis not present

## 2018-02-25 DIAGNOSIS — R197 Diarrhea, unspecified: Secondary | ICD-10-CM

## 2018-02-25 LAB — URINALYSIS, COMPLETE (UACMP) WITH MICROSCOPIC
BILIRUBIN URINE: NEGATIVE
Bacteria, UA: NONE SEEN
Glucose, UA: NEGATIVE mg/dL
Hgb urine dipstick: NEGATIVE
Ketones, ur: NEGATIVE mg/dL
LEUKOCYTES UA: NEGATIVE
Nitrite: NEGATIVE
PH: 5 (ref 5.0–8.0)
Protein, ur: NEGATIVE mg/dL
Specific Gravity, Urine: 1.012 (ref 1.005–1.030)

## 2018-02-25 LAB — CBC WITH DIFFERENTIAL/PLATELET
BASOS ABS: 0.1 10*3/uL (ref 0–0.1)
BASOS PCT: 1 %
Eosinophils Absolute: 0.1 10*3/uL (ref 0–0.7)
Eosinophils Relative: 1 %
HEMATOCRIT: 35.2 % (ref 35.0–47.0)
Hemoglobin: 11.8 g/dL — ABNORMAL LOW (ref 12.0–16.0)
LYMPHS PCT: 20 %
Lymphs Abs: 1.2 10*3/uL (ref 1.0–3.6)
MCH: 31.5 pg (ref 26.0–34.0)
MCHC: 33.5 g/dL (ref 32.0–36.0)
MCV: 94 fL (ref 80.0–100.0)
MONOS PCT: 10 %
Monocytes Absolute: 0.6 10*3/uL (ref 0.2–0.9)
NEUTROS ABS: 3.9 10*3/uL (ref 1.4–6.5)
Neutrophils Relative %: 68 %
Platelets: 146 10*3/uL — ABNORMAL LOW (ref 150–440)
RBC: 3.74 MIL/uL — ABNORMAL LOW (ref 3.80–5.20)
RDW: 18.6 % — ABNORMAL HIGH (ref 11.5–14.5)
WBC: 5.8 10*3/uL (ref 3.6–11.0)

## 2018-02-25 LAB — COMPREHENSIVE METABOLIC PANEL
ALBUMIN: 3.6 g/dL (ref 3.5–5.0)
ALK PHOS: 90 U/L (ref 38–126)
ALT: 9 U/L (ref 0–44)
ANION GAP: 5 (ref 5–15)
AST: 15 U/L (ref 15–41)
BILIRUBIN TOTAL: 0.4 mg/dL (ref 0.3–1.2)
BUN: 13 mg/dL (ref 6–20)
CALCIUM: 8.9 mg/dL (ref 8.9–10.3)
CO2: 22 mmol/L (ref 22–32)
Chloride: 110 mmol/L (ref 98–111)
Creatinine, Ser: 0.9 mg/dL (ref 0.44–1.00)
GFR calc Af Amer: >60 mL/min (ref >60)
GFR calc non Af Amer: >60 mL/min (ref >60)
GLUCOSE: 99 mg/dL (ref 70–99)
Potassium: 3.7 mmol/L (ref 3.5–5.1)
Sodium: 137 mmol/L (ref 135–145)
Total Protein: 6.4 g/dL — ABNORMAL LOW (ref 6.5–8.1)

## 2018-02-25 MED ORDER — SODIUM CHLORIDE 0.9 % IV SOLN
Freq: Once | INTRAVENOUS | Status: AC
  Administered 2018-02-25: 09:00:00 via INTRAVENOUS
  Filled 2018-02-25: qty 250

## 2018-02-25 MED ORDER — SODIUM CHLORIDE 0.9 % IV SOLN
5.0000 mg/kg | Freq: Once | INTRAVENOUS | Status: AC
  Administered 2018-02-25: 250 mg via INTRAVENOUS
  Filled 2018-02-25: qty 10

## 2018-02-25 MED ORDER — IRINOTECAN HCL CHEMO INJECTION 100 MG/5ML
120.0000 mg/m2 | Freq: Once | INTRAVENOUS | Status: AC
  Administered 2018-02-25: 180 mg via INTRAVENOUS
  Filled 2018-02-25: qty 5

## 2018-02-25 MED ORDER — SODIUM CHLORIDE 0.9% FLUSH
10.0000 mL | INTRAVENOUS | Status: DC | PRN
  Administered 2018-02-25: 10 mL via INTRAVENOUS
  Filled 2018-02-25: qty 10

## 2018-02-25 MED ORDER — HEPARIN SOD (PORK) LOCK FLUSH 100 UNIT/ML IV SOLN: 500.0000 [IU] | Freq: Once | INTRAVENOUS | Status: DC

## 2018-02-25 MED ORDER — ATROPINE SULFATE 1 MG/ML IJ SOLN
0.5000 mg | Freq: Once | INTRAMUSCULAR | Status: AC | PRN
  Administered 2018-02-25: 0.5 mg via INTRAVENOUS
  Filled 2018-02-25: qty 1

## 2018-02-25 MED ORDER — SODIUM CHLORIDE 0.9 % IV SOLN
2400.0000 mg/m2 | INTRAVENOUS | Status: DC
  Administered 2018-02-25: 3500 mg via INTRAVENOUS
  Filled 2018-02-25: qty 70

## 2018-02-25 MED ORDER — PALONOSETRON HCL INJECTION 0.25 MG/5ML
0.2500 mg | Freq: Once | INTRAVENOUS | Status: AC
  Administered 2018-02-25: 0.25 mg via INTRAVENOUS
  Filled 2018-02-25: qty 5

## 2018-02-25 MED ORDER — DEXAMETHASONE SODIUM PHOSPHATE 10 MG/ML IJ SOLN
10.0000 mg | Freq: Once | INTRAMUSCULAR | Status: AC
  Administered 2018-02-25: 10 mg via INTRAVENOUS
  Filled 2018-02-25: qty 1

## 2018-02-25 NOTE — Assessment & Plan Note (Addendum)
#   Colon cancer- cecal/ right-sided; STAGE IV; currently  on FOLFIRI + avastin [since July 2019]. OCT 3rd 2019- CT- slight increase liver lesion ~1.2 x1.2cm; stable mesenteric/L-2 disease.  CEA -10; stable; CT   # proceed with FOLFIRI+ Avastin [reduced iri to 120 mg/m2]; Labs today reviewed;  acceptable for treatment today.  We will plan to get a MRI in 2 or 3 months; and then consider liver ablation.   # Diarrhea-sec to iri-: Improved.  # Hypokalemia-improved.  Continue potassium supplement.  # back pain-continue norco prn.  Stable.    #Peripheral neuropathy grade 2-continue gabapentin plus Cymbalta. STABLE>   #treatment today; Follow-up in 2 weeks; FOLFIRI plus Avastin;DC pump- in 2 days;  UA; CEA.  Labs;   # I reviewed the blood work- with the patient in detail; also reviewed the imaging independently [as summarized above]; and with the patient in detail.

## 2018-02-25 NOTE — Progress Notes (Signed)
[place] OFFICE PROGRESS NOTE  Patient Care Team: Coral Spikes, DO as PCP - General (Family Medicine)  Cancer Staging No matching staging information was found for the patient.   Oncology History   # [facility] 2017- COLON CANCER- STAGE IV [s/p right hemi-colectomy; pT4apN2 (7/19LN)]; Pre-op CEA- [id number]  PET-  mesenteric adenopathy/mediastinal adenopathy/right-sided subclavicular lymph node.    April 17th -START FOLFOX; May 1st Add Avastin;   Aug 16th- Disc OX [sec PN]; con 5FU-Avastin; NOV 27th CT C/A/P- CR; except for 42m LUL; 5FU-Avastin q 3 W [Poor tolerance to FOLFOX].  # June/July 2019- Progression of Liver/Abd LN; July 2019- START FOLFIRI + Avastin  # March 2018- L2 uptake MET s/p RT [[facility]2018]; mid April X-geva   # MOLECULAR TESTING: K-ras-EXON 2- MUTATED; MSI- STABLE.   DIAGNOSIS: colon ca  STAGE:  IV       ;GOALS: pallaitive  CURRENT/MOST RECENT THERAPY: FOLFIRI+Avastin      Cancer of ascending colon metastatic to intra-abdominal lymph node (HCC)      INTERVAL HISTORY:  Nancy [id number].o.  female pleasant patient above history of metastatic adenocarcinoma of the colon currently on FOLFIRI plus Avastin is here for follow-up/review the results of the CT scan.  Patient denies any significant diarrhea.  No nausea no vomiting.  Appetite is fair.  No weight loss.  Tingling and numbness is stable.  Not any worse.  Chronic back pain stable.   Review of Systems  Constitutional: Positive for malaise/fatigue. Negative for chills, diaphoresis, fever and weight loss.  HENT: Negative for nosebleeds and sore throat.   Eyes: Negative for double vision.  Respiratory: Negative for cough, hemoptysis, sputum production, shortness of breath and wheezing.   Cardiovascular: Negative for chest pain, palpitations, orthopnea and leg swelling.  Gastrointestinal: Negative for abdominal pain, blood in stool, constipation, diarrhea, heartburn, melena, nausea  and vomiting.  Genitourinary: Negative for dysuria, frequency and urgency.  Musculoskeletal: Positive for back pain. Negative for joint pain.  Skin: Negative.  Negative for itching and rash.  Neurological: Positive for tingling. Negative for dizziness, focal weakness, weakness and headaches.  Endo/Heme/Allergies: Does not bruise/bleed easily.  Psychiatric/Behavioral: Negative for depression. The patient is not nervous/anxious and does not have insomnia.       PAST MEDICAL HISTORY :  Past Medical History:  Diagnosis Date  . Chicken pox   . [place] cancer ([place]    Partial colectomy 07/2015 + chemo tx's  . Hypertension   . Hypokalemia   . Menopause    > 5 yrs  . Peripheral neuropathy due to chemotherapy ([facility]     PAST SURGICAL HISTORY :   Past Surgical History:  Procedure Laterality Date  . BREAST BIOPSY Right    2007? pt unsure done by [facility]  . COLONOSCOPY    . COLONOSCOPY WITH PROPOFOL N/A 10/14/2015   Procedure: COLONOSCOPY WITH PROPOFOL;  Surgeon: PHulen Luster MD;  [facility];  Service: Endoscopy;  Laterality: N/A;  . COLOSTOMY REVISION Right 08/09/2015   Procedure: COLON RESECTION RIGHT;  Surgeon: RFlorene Glen MD;  Location: [facility] ORS;  Service: General;  Laterality: Right;  . ECTOPIC PREGNANCY SURGERY    . PORTACATH PLACEMENT N/A 09/01/2015   Procedure: INSERTION PORT-A-CATH;  Surgeon: RFlorene Glen MD;  Location: [facility] ORS;  Service: General;  Laterality: N/A;  . SHOULDER SURGERY Left     FAMILY HISTORY :   Family History  Problem Relation Age of Onset  . Hypertension Mother   .  Arthritis Father   . Prostate cancer Father   . Diabetes Maternal Grandmother   . Colon cancer Maternal Grandfather        colon    SOCIAL HISTORY:   Social History   Tobacco Use  . Smoking status: Former Smoker    Packs/day: 0.25    Years: 2.00    Pack years: 0.50  . Smokeless tobacco: Never Used  . Tobacco comment: recently quit  Substance Use Topics  . Alcohol  use: No    Alcohol/week: 0.0 standard drinks  . Drug use: No    ALLERGIES:  has No Known Allergies.  MEDICATIONS:  Current Outpatient Medications  Medication Sig Dispense Refill  . acetaminophen (TYLENOL) 325 MG tablet Take 2 tablets (650 mg total) by mouth every 6 (six) hours as needed for mild pain (or Fever >/= 101).    . benazepril (LOTENSIN) 20 MG tablet TAKE 1 TABLET DAILY 90 tablet 1  . DULoxetine (CYMBALTA) 20 MG capsule Take 2 capsules (40 mg total) by mouth daily. 180 capsule 0  . feeding supplement (BOOST HIGH PROTEIN) LIQD Take 1 Container by mouth daily.    . ferrous sulfate (FERROUSUL) 325 (65 FE) MG tablet Take 1 tablet (325 mg total) by mouth daily with breakfast. 90 tablet 3  . gabapentin (NEURONTIN) 300 MG capsule One pill in am; and 2 pills at night prior to sleep. 90 capsule 3  . hydrochlorothiazide (MICROZIDE) 12.5 MG capsule TAKE 1 CAPSULE DAILY 30 capsule 6  . HYDROcodone-acetaminophen (NORCO/VICODIN) 5-325 MG tablet Take 1 tablet by mouth every 12 (twelve) hours as needed for moderate pain. 60 tablet 0  . lidocaine (LIDODERM) 5 % Place 1 patch onto the skin daily. Remove & Discard patch within 12 hours or as directed by MD 30 patch 2  . lidocaine-prilocaine (EMLA) cream Apply 1 application topically as needed. Apply generously over the Mediport 45 minutes prior to chemotherapy. 30 g 6  . Multiple Vitamins-Minerals (MULTIVITAMIN ADULT PO) Take 1 tablet by mouth daily.    . potassium chloride SA (K-DUR,KLOR-CON) 20 MEQ tablet 1 pill twice a day 60 tablet 3  . prochlorperazine (COMPAZINE) 10 MG tablet Take 1 tablet (10 mg total) by mouth every 6 (six) hours as needed for nausea or vomiting. 90 tablet 1  . diphenoxylate-atropine (LOMOTIL) 2.5-0.025 MG tablet Take 1 tablet by mouth 4 (four) times daily as needed for diarrhea or loose stools. Take it along with immodium (Patient not taking: Reported on 02/25/2018) 40 tablet 4  . loperamide (IMODIUM) 2 MG capsule Take 2 mg by  mouth as needed for diarrhea or loose stools.    . ondansetron (ZOFRAN) 8 MG tablet Take 1 tablet (8 mg total) by mouth every 8 (eight) hours as needed for nausea or vomiting (start 3 days; after chemo). (Patient not taking: Reported on 02/25/2018) 40 tablet 0   No current facility-administered medications for this visit.    Facility-Administered Medications Ordered in Other Visits  Medication Dose Route Frequency Provider Last Rate Last Dose  . heparin lock flush 100 unit/mL  500 Units Intravenous Once Charlaine Dalton R, MD      . sodium chloride flush (NS) 0.9 % injection 10 mL  10 mL Intravenous PRN Cammie Sickle, MD   10 mL at 10/04/15 0901  . sodium chloride flush (NS) 0.9 % injection 10 mL  10 mL Intravenous PRN Cammie Sickle, MD   10 mL at 07/02/17 1020  . sodium chloride flush (NS) 0.9 %  injection 10 mL  10 mL Intravenous PRN Cammie Sickle, MD   10 mL at 02/25/18 0815    PHYSICAL EXAMINATION: ECOG PERFORMANCE STATUS: 1 - Symptomatic but completely ambulatory  BP 110/70   Pulse 80   Temp 97.7 F (36.5 C) (Oral)   Resp 18   LMP  (LMP Unknown) Comment: LMP MORE THAN 5 YRS  There were no vitals filed for this visit.  Physical Exam  Constitutional: She is oriented to person, place, and time and well-developed, well-nourished, and in no distress.  She is accompanied by mother.  She is walking herself.  HENT:  Head: Normocephalic and atraumatic.  Mouth/Throat: Oropharynx is clear and moist. No oropharyngeal exudate.  Eyes: Pupils are equal, round, and reactive to light.  Neck: Normal range of motion. Neck supple.  Cardiovascular: Normal rate and regular rhythm.  Pulmonary/Chest: No respiratory distress. She has no wheezes.  Abdominal: Soft. Bowel sounds are normal. She exhibits no distension and no mass. There is no tenderness. There is no rebound and no guarding.  Musculoskeletal: Normal range of motion. She exhibits no edema or tenderness.   Neurological: She is alert and oriented to person, place, and time.  Skin: Skin is warm.  Psychiatric: Affect normal.       LABORATORY DATA:  I have reviewed the data as listed    Component Value Date/Time   NA 137 02/25/2018 0815   K 3.7 02/25/2018 0815   CL 110 02/25/2018 0815   CO2 22 02/25/2018 0815   GLUCOSE 99 02/25/2018 0815   BUN 13 02/25/2018 0815   CREATININE 0.90 02/25/2018 0815   CALCIUM 8.9 02/25/2018 0815   PROT 6.4 (L) 02/25/2018 0815   ALBUMIN 3.6 02/25/2018 0815   AST 15 02/25/2018 0815   ALT 9 02/25/2018 0815   ALKPHOS 90 02/25/2018 0815   BILITOT 0.4 02/25/2018 0815   GFRNONAA >60 02/25/2018 0815   GFRAA >60 02/25/2018 0815    No results found for: SPEP, UPEP  Lab Results  Component Value Date   WBC 5.8 02/25/2018   NEUTROABS 3.9 02/25/2018   HGB 11.8 (L) 02/25/2018   HCT 35.2 02/25/2018   MCV 94.0 02/25/2018   PLT 146 (L) 02/25/2018      Chemistry      Component Value Date/Time   NA 137 02/25/2018 0815   K 3.7 02/25/2018 0815   CL 110 02/25/2018 0815   CO2 22 02/25/2018 0815   BUN 13 02/25/2018 0815   CREATININE 0.90 02/25/2018 0815      Component Value Date/Time   CALCIUM 8.9 02/25/2018 0815   ALKPHOS 90 02/25/2018 0815   AST 15 02/25/2018 0815   ALT 9 02/25/2018 0815   BILITOT 0.4 02/25/2018 0815       RADIOGRAPHIC STUDIES: I have personally reviewed the radiological images as listed and agreed with the findings in the report. No results found.   ASSESSMENT & PLAN:  Cancer of ascending colon metastatic to intra-abdominal lymph node [facility]) # Colon cancer- cecal/ right-sided; STAGE IV; currently  on FOLFIRI + avastin [since July 2019]. OCT 3rd 2019- CT- slight increase liver lesion ~1.2 x1.2cm; stable mesenteric/L-2 disease.  CEA -10; stable; CT   # proceed with FOLFIRI+ Avastin [reduced iri to 120 mg/m2]; Labs today reviewed;  acceptable for treatment today.  We will plan to get a MRI in 2 or 3 months; and then consider liver  ablation.   # Diarrhea-sec to iri-: Improved.  # Hypokalemia-improved.  Continue potassium supplement.  #  back pain-continue norco prn.  Stable.    #Peripheral neuropathy grade 2-continue gabapentin plus Cymbalta. STABLE>   #treatment today; Follow-up in 2 weeks; FOLFIRI plus Avastin;DC pump- in 2 days;  UA; CEA.  Labs;   # I reviewed the blood work- with the patient in detail; also reviewed the imaging independently [as summarized above]; and with the patient in detail.     Orders Placed This Encounter  Procedures  . Comprehensive metabolic panel    Standing Status:   Standing    Number of Occurrences:   20    Standing Expiration Date:   02/26/2019  . CBC with Differential    Standing Status:   Standing    Number of Occurrences:   20    Standing Expiration Date:   02/26/2019  . CEA    Standing Status:   Standing    Number of Occurrences:   20    Standing Expiration Date:   02/26/2019  . Urinalysis, Complete w Microscopic    Standing Status:   Standing    Number of Occurrences:   20    Standing Expiration Date:   02/26/2019   All questions were answered. The patient knows to call the clinic with any problems, questions or concerns.      Cammie Sickle, MD 02/25/2018 9:03 AM

## 2018-02-26 LAB — CEA: CEA: 9.9 ng/mL — ABNORMAL HIGH (ref 0.0–4.7)

## 2018-02-27 ENCOUNTER — Inpatient Hospital Stay: Payer: BLUE CROSS/BLUE SHIELD

## 2018-02-27 VITALS — BP 116/72 | HR 63 | Temp 96.8°F | Resp 18

## 2018-02-27 DIAGNOSIS — C182 Malignant neoplasm of ascending colon: Secondary | ICD-10-CM

## 2018-02-27 DIAGNOSIS — C772 Secondary and unspecified malignant neoplasm of intra-abdominal lymph nodes: Principal | ICD-10-CM

## 2018-02-27 MED ORDER — HEPARIN SOD (PORK) LOCK FLUSH 100 UNIT/ML IV SOLN
INTRAVENOUS | Status: AC
  Filled 2018-02-27: qty 5

## 2018-02-27 MED ORDER — HEPARIN SOD (PORK) LOCK FLUSH 100 UNIT/ML IV SOLN
500.0000 [IU] | Freq: Once | INTRAVENOUS | Status: AC | PRN
  Administered 2018-02-27: 500 [IU]

## 2018-03-11 ENCOUNTER — Inpatient Hospital Stay: Payer: BLUE CROSS/BLUE SHIELD

## 2018-03-11 ENCOUNTER — Inpatient Hospital Stay (HOSPITAL_BASED_OUTPATIENT_CLINIC_OR_DEPARTMENT_OTHER): Payer: BLUE CROSS/BLUE SHIELD | Admitting: Internal Medicine

## 2018-03-11 ENCOUNTER — Encounter: Payer: Self-pay | Admitting: Internal Medicine

## 2018-03-11 VITALS — BP 106/70 | HR 72 | Temp 96.2°F | Resp 18 | Wt 100.4 lb

## 2018-03-11 DIAGNOSIS — G620 Drug-induced polyneuropathy: Secondary | ICD-10-CM

## 2018-03-11 DIAGNOSIS — G8929 Other chronic pain: Secondary | ICD-10-CM

## 2018-03-11 DIAGNOSIS — I10 Essential (primary) hypertension: Secondary | ICD-10-CM

## 2018-03-11 DIAGNOSIS — C772 Secondary and unspecified malignant neoplasm of intra-abdominal lymph nodes: Principal | ICD-10-CM

## 2018-03-11 DIAGNOSIS — C182 Malignant neoplasm of ascending colon: Secondary | ICD-10-CM

## 2018-03-11 DIAGNOSIS — E876 Hypokalemia: Secondary | ICD-10-CM

## 2018-03-11 DIAGNOSIS — Z79899 Other long term (current) drug therapy: Secondary | ICD-10-CM

## 2018-03-11 DIAGNOSIS — Z87891 Personal history of nicotine dependence: Secondary | ICD-10-CM

## 2018-03-11 DIAGNOSIS — M549 Dorsalgia, unspecified: Secondary | ICD-10-CM

## 2018-03-11 LAB — CBC WITH DIFFERENTIAL/PLATELET
Abs Immature Granulocytes: 0.03 10*3/uL (ref 0.00–0.07)
Basophils Absolute: 0 10*3/uL (ref 0.0–0.1)
Basophils Relative: 1 %
EOS ABS: 0.1 10*3/uL (ref 0.0–0.5)
EOS PCT: 1 %
HEMATOCRIT: 34.5 % — AB (ref 36.0–46.0)
HEMOGLOBIN: 11.1 g/dL — AB (ref 12.0–15.0)
Immature Granulocytes: 1 %
Lymphocytes Relative: 17 %
Lymphs Abs: 1.1 10*3/uL (ref 0.7–4.0)
MCH: 30.1 pg (ref 26.0–34.0)
MCHC: 32.2 g/dL (ref 30.0–36.0)
MCV: 93.5 fL (ref 80.0–100.0)
Monocytes Absolute: 0.7 10*3/uL (ref 0.1–1.0)
Monocytes Relative: 10 %
NEUTROS PCT: 70 %
NRBC: 0 % (ref 0.0–0.2)
Neutro Abs: 4.6 10*3/uL (ref 1.7–7.7)
Platelets: 167 10*3/uL (ref 150–400)
RBC: 3.69 MIL/uL — ABNORMAL LOW (ref 3.87–5.11)
RDW: 17.4 % — AB (ref 11.5–15.5)
WBC: 6.5 10*3/uL (ref 4.0–10.5)

## 2018-03-11 LAB — COMPREHENSIVE METABOLIC PANEL
ALK PHOS: 84 U/L (ref 38–126)
ALT: 9 U/L (ref 0–44)
ANION GAP: 5 (ref 5–15)
AST: 14 U/L — ABNORMAL LOW (ref 15–41)
Albumin: 3.4 g/dL — ABNORMAL LOW (ref 3.5–5.0)
BILIRUBIN TOTAL: 0.3 mg/dL (ref 0.3–1.2)
BUN: 11 mg/dL (ref 6–20)
CALCIUM: 9.2 mg/dL (ref 8.9–10.3)
CO2: 22 mmol/L (ref 22–32)
Chloride: 112 mmol/L — ABNORMAL HIGH (ref 98–111)
Creatinine, Ser: 0.79 mg/dL (ref 0.44–1.00)
GFR calc Af Amer: >60 mL/min (ref >60)
GLUCOSE: 94 mg/dL (ref 70–99)
Potassium: 4 mmol/L (ref 3.5–5.1)
Sodium: 139 mmol/L (ref 135–145)
TOTAL PROTEIN: 6.4 g/dL — AB (ref 6.5–8.1)

## 2018-03-11 LAB — URINALYSIS, COMPLETE (UACMP) WITH MICROSCOPIC
Bilirubin Urine: NEGATIVE
Glucose, UA: NEGATIVE mg/dL
Hgb urine dipstick: NEGATIVE
Ketones, ur: NEGATIVE mg/dL
Leukocytes, UA: NEGATIVE
Nitrite: NEGATIVE
PROTEIN: NEGATIVE mg/dL
SQUAMOUS EPITHELIAL / LPF: NONE SEEN (ref 0–5)
Specific Gravity, Urine: 1.011 (ref 1.005–1.030)
pH: 5 (ref 5.0–8.0)

## 2018-03-11 MED ORDER — SODIUM CHLORIDE 0.9 % IV SOLN: 8.0000 mg | Freq: Once | INTRAVENOUS | Status: DC

## 2018-03-11 MED ORDER — SODIUM CHLORIDE 0.9 % IV SOLN
2400.0000 mg/m2 | INTRAVENOUS | Status: DC
  Administered 2018-03-11: 3500 mg via INTRAVENOUS
  Filled 2018-03-11: qty 20

## 2018-03-11 MED ORDER — DEXAMETHASONE SODIUM PHOSPHATE 10 MG/ML IJ SOLN
10.0000 mg | Freq: Once | INTRAMUSCULAR | Status: AC
  Administered 2018-03-11: 10 mg via INTRAVENOUS
  Filled 2018-03-11: qty 1

## 2018-03-11 MED ORDER — SODIUM CHLORIDE 0.9 % IV SOLN
5.0000 mg/kg | Freq: Once | INTRAVENOUS | Status: AC
  Administered 2018-03-11: 250 mg via INTRAVENOUS
  Filled 2018-03-11: qty 10

## 2018-03-11 MED ORDER — ONDANSETRON HCL 4 MG/2ML IJ SOLN
8.0000 mg | Freq: Once | INTRAMUSCULAR | Status: AC
  Administered 2018-03-11: 8 mg via INTRAVENOUS
  Filled 2018-03-11: qty 4

## 2018-03-11 MED ORDER — SODIUM CHLORIDE 0.9 % IV SOLN
Freq: Once | INTRAVENOUS | Status: AC
  Administered 2018-03-11: 10:00:00 via INTRAVENOUS
  Filled 2018-03-11: qty 250

## 2018-03-11 NOTE — Progress Notes (Signed)
[facility] OFFICE PROGRESS NOTE  Patient Care Team: Cook, Jayce G, DO as PCP - General (Family Medicine)  Cancer Staging No matching staging information was found for the patient.   Oncology History   # FEB-MARCH 2017- COLON CANCER- STAGE IV [s/p right hemi-colectomy; pT4apN2 (7/19LN)]; Pre-op CEA- 7.April2017-  PET-  mesenteric adenopathy/mediastinal adenopathy/right-sided subclavicular lymph node.    April 17th -START FOLFOX; May 1st Add Avastin;   Aug 16th- Disc OX [sec PN]; con 5FU-Avastin; NOV 27th CT C/A/P- CR; except for 4mm LUL; 5FU-Avastin q 3 W [Poor tolerance to FOLFOX].  # June/July 2019- Progression of Liver/Abd LN; July 2019- START FOLFIRI + Avastin; STOP iri 02/25/2018- sec to diarrhea; cont Avastin + 5FU  # March 2018- L2 uptake MET s/p RT [May 2018]; mid April X-geva   # MOLECULAR TESTING: K-ras-EXON 2- MUTATED; MSI- STABLE.   DIAGNOSIS: colon ca  STAGE:  IV ;GOALS: pallaitive  CURRENT/MOST RECENT THERAPY: 5FU+Avastin      Cancer of ascending colon metastatic to intra-abdominal lymph node (HCC)      INTERVAL HISTORY:  Nancy Clark 57 y.o.  female pleasant patient above history of metastatic adenocarcinoma of the colon currently on FOLFIRI plus Avastin is here for follow-up.  Patient continues to complain of diarrhea 3-4 loose stools/each meal.  She continues to be on Lomotil/Imodium.  No significant nausea vomiting. Tingling and numbness is stable.  Not any worse.  Chronic back pain stable.   Review of Systems  Constitutional: Positive for malaise/fatigue. Negative for chills, diaphoresis, fever and weight loss.  HENT: Negative for nosebleeds and sore throat.   Eyes: Negative for double vision.  Respiratory: Negative for cough, hemoptysis, sputum production, shortness of breath and wheezing.   Cardiovascular: Negative for chest pain, palpitations, orthopnea and leg swelling.  Gastrointestinal: Positive for diarrhea. Negative for  abdominal pain, blood in stool, constipation, heartburn, melena, nausea and vomiting.  Genitourinary: Negative for dysuria, frequency and urgency.  Musculoskeletal: Positive for back pain. Negative for joint pain.  Skin: Negative.  Negative for itching and rash.  Neurological: Positive for tingling. Negative for dizziness, focal weakness, weakness and headaches.  Endo/Heme/Allergies: Does not bruise/bleed easily.  Psychiatric/Behavioral: Negative for depression. The patient is not nervous/anxious and does not have insomnia.       PAST MEDICAL HISTORY :  Past Medical History:  Diagnosis Date  . Chicken pox   . Colon cancer (HCC)    Partial colectomy 07/2015 + chemo tx's  . Hypertension   . Hypokalemia   . Menopause    > 5 yrs  . Peripheral neuropathy due to chemotherapy (HCC)     PAST SURGICAL HISTORY :   Past Surgical History:  Procedure Laterality Date  . BREAST BIOPSY Right    2007? pt unsure done by [facility]  . COLONOSCOPY    . COLONOSCOPY WITH PROPOFOL N/A 10/14/2015   Procedure: COLONOSCOPY WITH PROPOFOL;  Surgeon: Paul Y Oh, MD;  Location: [facility] ENDOSCOPY;  Service: Endoscopy;  Laterality: N/A;  . COLOSTOMY REVISION Right 08/09/2015   Procedure: COLON RESECTION RIGHT;  Surgeon: Richard E Cooper, MD;  Location: [facility] ORS;  Service: General;  Laterality: Right;  . ECTOPIC PREGNANCY SURGERY    . PORTACATH PLACEMENT N/A 09/01/2015   Procedure: INSERTION PORT-A-CATH;  Surgeon: Richard E Cooper, MD;  Location: [facility] ORS;  Service: General;  Laterality: N/A;  . SHOULDER SURGERY Left     FAMILY HISTORY :   Family History  Problem Relation Age of Onset  .   Hypertension Mother   . Arthritis Father   . Prostate cancer Father   . Diabetes Maternal Grandmother   . Colon cancer Maternal Grandfather        colon    SOCIAL HISTORY:   Social History   Tobacco Use  . Smoking status: Former Smoker    Packs/day: 0.25    Years: 2.00    Pack years: 0.50  . Smokeless tobacco: Never Used   . Tobacco comment: recently quit  Substance Use Topics  . Alcohol use: No    Alcohol/week: 0.0 standard drinks  . Drug use: No    ALLERGIES:  has No Known Allergies.  MEDICATIONS:  Current Outpatient Medications  Medication Sig Dispense Refill  . acetaminophen (TYLENOL) 325 MG tablet Take 2 tablets (650 mg total) by mouth every 6 (six) hours as needed for mild pain (or Fever >/= 101).    . benazepril (LOTENSIN) 20 MG tablet TAKE 1 TABLET DAILY 90 tablet 1  . diphenoxylate-atropine (LOMOTIL) 2.5-0.025 MG tablet Take 1 tablet by mouth 4 (four) times daily as needed for diarrhea or loose stools. Take it along with immodium 40 tablet 4  . DULoxetine (CYMBALTA) 20 MG capsule Take 2 capsules (40 mg total) by mouth daily. 180 capsule 0  . feeding supplement (BOOST HIGH PROTEIN) LIQD Take 1 Container by mouth daily.    . ferrous sulfate (FERROUSUL) 325 (65 FE) MG tablet Take 1 tablet (325 mg total) by mouth daily with breakfast. 90 tablet 3  . gabapentin (NEURONTIN) 300 MG capsule One pill in am; and 2 pills at night prior to sleep. 90 capsule 3  . hydrochlorothiazide (MICROZIDE) 12.5 MG capsule TAKE 1 CAPSULE DAILY 30 capsule 6  . HYDROcodone-acetaminophen (NORCO/VICODIN) 5-325 MG tablet Take 1 tablet by mouth every 12 (twelve) hours as needed for moderate pain. 60 tablet 0  . lidocaine (LIDODERM) 5 % Place 1 patch onto the skin daily. Remove & Discard patch within 12 hours or as directed by MD 30 patch 2  . lidocaine-prilocaine (EMLA) cream Apply 1 application topically as needed. Apply generously over the Mediport 45 minutes prior to chemotherapy. 30 g 6  . loperamide (IMODIUM) 2 MG capsule Take 2 mg by mouth as needed for diarrhea or loose stools.    . Multiple Vitamins-Minerals (MULTIVITAMIN ADULT PO) Take 1 tablet by mouth daily.    . ondansetron (ZOFRAN) 8 MG tablet Take 1 tablet (8 mg total) by mouth every 8 (eight) hours as needed for nausea or vomiting (start 3 days; after chemo). 40  tablet 0  . potassium chloride SA (K-DUR,KLOR-CON) 20 MEQ tablet 1 pill twice a day 60 tablet 3  . prochlorperazine (COMPAZINE) 10 MG tablet Take 1 tablet (10 mg total) by mouth every 6 (six) hours as needed for nausea or vomiting. 90 tablet 1   No current facility-administered medications for this visit.    Facility-Administered Medications Ordered in Other Visits  Medication Dose Route Frequency Provider Last Rate Last Dose  . sodium chloride flush (NS) 0.9 % injection 10 mL  10 mL Intravenous PRN Cammie Sickle, MD   10 mL at 10/04/15 0901  . sodium chloride flush (NS) 0.9 % injection 10 mL  10 mL Intravenous PRN Cammie Sickle, MD   10 mL at 07/02/17 1020    PHYSICAL EXAMINATION: ECOG PERFORMANCE STATUS: 1 - Symptomatic but completely ambulatory  BP 106/70 (BP Location: Left Arm, Patient Position: Sitting)   Pulse 72   Temp (!) 96.2 F (  35.7 C) (Tympanic)   Resp 18   Wt 100 lb 6 oz (45.5 kg)   LMP  (LMP Unknown) Comment: LMP MORE THAN 5 YRS  BMI 19.60 kg/m   Filed Weights   03/11/18 0859  Weight: 100 lb 6 oz (45.5 kg)    Physical Exam  Constitutional: She is oriented to person, place, and time and well-developed, well-nourished, and in no distress.  She is accompanied by husband.  She is walking herself.  HENT:  Head: Normocephalic and atraumatic.  Mouth/Throat: Oropharynx is clear and moist. No oropharyngeal exudate.  Eyes: Pupils are equal, round, and reactive to light.  Neck: Normal range of motion. Neck supple.  Cardiovascular: Normal rate and regular rhythm.  Pulmonary/Chest: No respiratory distress. She has no wheezes.  Abdominal: Soft. Bowel sounds are normal. She exhibits no distension and no mass. There is no tenderness. There is no rebound and no guarding.  Musculoskeletal: Normal range of motion. She exhibits no edema or tenderness.  Neurological: She is alert and oriented to person, place, and time.  Skin: Skin is warm.  Psychiatric: Affect  normal.       LABORATORY DATA:  I have reviewed the data as listed    Component Value Date/Time   NA 139 03/11/2018 0835   K 4.0 03/11/2018 0835   CL 112 (H) 03/11/2018 0835   CO2 22 03/11/2018 0835   GLUCOSE 94 03/11/2018 0835   BUN 11 03/11/2018 0835   CREATININE 0.79 03/11/2018 0835   CALCIUM 9.2 03/11/2018 0835   PROT 6.4 (L) 03/11/2018 0835   ALBUMIN 3.4 (L) 03/11/2018 0835   AST 14 (L) 03/11/2018 0835   ALT 9 03/11/2018 0835   ALKPHOS 84 03/11/2018 0835   BILITOT 0.3 03/11/2018 0835   GFRNONAA >60 03/11/2018 0835   GFRAA >60 03/11/2018 0835    No results found for: SPEP, UPEP  Lab Results  Component Value Date   WBC 6.5 03/11/2018   NEUTROABS 4.6 03/11/2018   HGB 11.1 (L) 03/11/2018   HCT 34.5 (L) 03/11/2018   MCV 93.5 03/11/2018   PLT 167 03/11/2018      Chemistry      Component Value Date/Time   NA 139 03/11/2018 0835   K 4.0 03/11/2018 0835   CL 112 (H) 03/11/2018 0835   CO2 22 03/11/2018 0835   BUN 11 03/11/2018 0835   CREATININE 0.79 03/11/2018 0835      Component Value Date/Time   CALCIUM 9.2 03/11/2018 0835   ALKPHOS 84 03/11/2018 0835   AST 14 (L) 03/11/2018 0835   ALT 9 03/11/2018 0835   BILITOT 0.3 03/11/2018 0835       RADIOGRAPHIC STUDIES: I have personally reviewed the radiological images as listed and agreed with the findings in the report. No results found.   ASSESSMENT & PLAN:  Cancer of ascending colon metastatic to intra-abdominal lymph node (HCC) # Colon cancer- cecal/ right-sided; STAGE IV; currently  on FOLFIRI + avastin [since July 2019]. OCT 3rd 2019- CT- slight increase liver lesion ~1.2 x1.2cm; stable mesenteric/L-2 disease.  CEA -10; stable.  #Proceed with 5-FU plus Avastin; discontinue irinotecan because of ongoing diarrhea. Labs today reviewed;  acceptable for treatment today. We will  plan to get a MRI in 2 or 3 months; and then consider liver ablation.   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   Recommend discontinuing irinotecan at this time.  # Hypokalemia-improved.  Continue potassium 20 mg once a day.  # back pain-continue norco prn.    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 S stable  DISPOSITION: # Treatment today- BUT 5FU+ avastin ONLY; discontinue- irinotecan # Follow up in - 2 weeks; MD/ labs- cbc/cmp/cea/UA/ chemo-5FU+ avastin; pump  off in 2days-Dr.B      No orders of the defined types were placed in this encounter.  All questions were answered. The patient knows to call the clinic with any problems, questions or concerns.      Govinda R Brahmanday, MD 03/11/2018 4:46 PM  

## 2018-03-11 NOTE — Assessment & Plan Note (Addendum)
#   Colon cancer- cecal/ right-sided; STAGE IV; currently  on FOLFIRI + avastin [since July 2019]. OCT 3rd 2019- CT- slight increase liver lesion ~1.2 x1.2cm; stable mesenteric/L-2 disease.  CEA -10; stable.  #Proceed with 5-FU plus Avastin; discontinue irinotecan because of ongoing diarrhea. Labs today reviewed;  acceptable for treatment today. We will  plan to get a MRI in 2 or 3 months; and then consider liver ablation.   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  Recommend discontinuing irinotecan at this time.  # Hypokalemia-improved.  Continue potassium 20 mg once a day.  # back pain-continue norco prn.  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 S stable  DISPOSITION: # Treatment today- BUT 5FU+ avastin ONLY; discontinue- irinotecan # Follow up in - 2 weeks; MD/ labs- cbc/cmp/cea/UA/ chemo-5FU+ avastin; pump  off in 2days-Dr.B

## 2018-03-11 NOTE — Progress Notes (Signed)
Pt in for follow up and treatment today. Reports decreased appetite, nausea and diarrhea.  Using nausea meds and Imodium and lomotil.

## 2018-03-12 LAB — CEA: CEA1: 15 ng/mL — AB (ref 0.0–4.7)

## 2018-03-13 ENCOUNTER — Inpatient Hospital Stay: Payer: BLUE CROSS/BLUE SHIELD

## 2018-03-13 VITALS — BP 108/68 | HR 69 | Resp 20

## 2018-03-13 DIAGNOSIS — C182 Malignant neoplasm of ascending colon: Secondary | ICD-10-CM

## 2018-03-13 DIAGNOSIS — C772 Secondary and unspecified malignant neoplasm of intra-abdominal lymph nodes: Principal | ICD-10-CM

## 2018-03-13 MED ORDER — SODIUM CHLORIDE 0.9% FLUSH
10.0000 mL | INTRAVENOUS | Status: DC | PRN
  Administered 2018-03-13: 10 mL
  Filled 2018-03-13: qty 10

## 2018-03-13 MED ORDER — HEPARIN SOD (PORK) LOCK FLUSH 100 UNIT/ML IV SOLN
500.0000 [IU] | Freq: Once | INTRAVENOUS | Status: AC | PRN
  Administered 2018-03-13: 500 [IU]
  Filled 2018-03-13: qty 5

## 2018-03-25 ENCOUNTER — Inpatient Hospital Stay: Payer: BLUE CROSS/BLUE SHIELD | Attending: Internal Medicine

## 2018-03-25 ENCOUNTER — Inpatient Hospital Stay (HOSPITAL_BASED_OUTPATIENT_CLINIC_OR_DEPARTMENT_OTHER): Payer: BLUE CROSS/BLUE SHIELD | Admitting: Internal Medicine

## 2018-03-25 ENCOUNTER — Encounter: Payer: Self-pay | Admitting: Internal Medicine

## 2018-03-25 ENCOUNTER — Inpatient Hospital Stay: Payer: BLUE CROSS/BLUE SHIELD

## 2018-03-25 DIAGNOSIS — C787 Secondary malignant neoplasm of liver and intrahepatic bile duct: Secondary | ICD-10-CM | POA: Diagnosis not present

## 2018-03-25 DIAGNOSIS — Z5112 Encounter for antineoplastic immunotherapy: Secondary | ICD-10-CM | POA: Insufficient documentation

## 2018-03-25 DIAGNOSIS — C772 Secondary and unspecified malignant neoplasm of intra-abdominal lymph nodes: Secondary | ICD-10-CM | POA: Insufficient documentation

## 2018-03-25 DIAGNOSIS — I10 Essential (primary) hypertension: Secondary | ICD-10-CM | POA: Diagnosis not present

## 2018-03-25 DIAGNOSIS — T451X5A Adverse effect of antineoplastic and immunosuppressive drugs, initial encounter: Principal | ICD-10-CM

## 2018-03-25 DIAGNOSIS — E876 Hypokalemia: Secondary | ICD-10-CM | POA: Insufficient documentation

## 2018-03-25 DIAGNOSIS — R112 Nausea with vomiting, unspecified: Secondary | ICD-10-CM

## 2018-03-25 DIAGNOSIS — C182 Malignant neoplasm of ascending colon: Secondary | ICD-10-CM

## 2018-03-25 DIAGNOSIS — Z5111 Encounter for antineoplastic chemotherapy: Secondary | ICD-10-CM | POA: Insufficient documentation

## 2018-03-25 DIAGNOSIS — Z79899 Other long term (current) drug therapy: Secondary | ICD-10-CM | POA: Diagnosis not present

## 2018-03-25 DIAGNOSIS — G629 Polyneuropathy, unspecified: Secondary | ICD-10-CM | POA: Diagnosis not present

## 2018-03-25 DIAGNOSIS — Z87891 Personal history of nicotine dependence: Secondary | ICD-10-CM | POA: Insufficient documentation

## 2018-03-25 LAB — COMPREHENSIVE METABOLIC PANEL
ALBUMIN: 3.6 g/dL (ref 3.5–5.0)
ALT: 10 U/L (ref 0–44)
ANION GAP: 7 (ref 5–15)
AST: 15 U/L (ref 15–41)
Alkaline Phosphatase: 81 U/L (ref 38–126)
BILIRUBIN TOTAL: 0.5 mg/dL (ref 0.3–1.2)
BUN: 14 mg/dL (ref 6–20)
CALCIUM: 8.8 mg/dL — AB (ref 8.9–10.3)
CO2: 24 mmol/L (ref 22–32)
CREATININE: 0.88 mg/dL (ref 0.44–1.00)
Chloride: 106 mmol/L (ref 98–111)
GFR calc non Af Amer: >60 mL/min (ref >60)
GLUCOSE: 101 mg/dL — AB (ref 70–99)
Potassium: 3.6 mmol/L (ref 3.5–5.1)
Sodium: 137 mmol/L (ref 135–145)
Total Protein: 6.5 g/dL (ref 6.5–8.1)

## 2018-03-25 LAB — URINALYSIS, COMPLETE (UACMP) WITH MICROSCOPIC
BILIRUBIN URINE: NEGATIVE
Bacteria, UA: NONE SEEN
Glucose, UA: NEGATIVE mg/dL
Hgb urine dipstick: NEGATIVE
KETONES UR: NEGATIVE mg/dL
Leukocytes, UA: NEGATIVE
NITRITE: NEGATIVE
PH: 5 (ref 5.0–8.0)
Protein, ur: NEGATIVE mg/dL
Specific Gravity, Urine: 1.012 (ref 1.005–1.030)

## 2018-03-25 LAB — CBC WITH DIFFERENTIAL/PLATELET
Abs Immature Granulocytes: 0.02 10*3/uL (ref 0.00–0.07)
BASOS ABS: 0.1 10*3/uL (ref 0.0–0.1)
Basophils Relative: 1 %
EOS ABS: 0.1 10*3/uL (ref 0.0–0.5)
EOS PCT: 2 %
HEMATOCRIT: 34.9 % — AB (ref 36.0–46.0)
Hemoglobin: 11.4 g/dL — ABNORMAL LOW (ref 12.0–15.0)
Immature Granulocytes: 0 %
LYMPHS ABS: 1.6 10*3/uL (ref 0.7–4.0)
Lymphocytes Relative: 22 %
MCH: 30.6 pg (ref 26.0–34.0)
MCHC: 32.7 g/dL (ref 30.0–36.0)
MCV: 93.8 fL (ref 80.0–100.0)
Monocytes Absolute: 0.7 10*3/uL (ref 0.1–1.0)
Monocytes Relative: 10 %
NRBC: 0 % (ref 0.0–0.2)
Neutro Abs: 4.6 10*3/uL (ref 1.7–7.7)
Neutrophils Relative %: 65 %
Platelets: 148 10*3/uL — ABNORMAL LOW (ref 150–400)
RBC: 3.72 MIL/uL — ABNORMAL LOW (ref 3.87–5.11)
RDW: 17.4 % — AB (ref 11.5–15.5)
WBC: 7.1 10*3/uL (ref 4.0–10.5)

## 2018-03-25 MED ORDER — SODIUM CHLORIDE 0.9 % IV SOLN: 8.0000 mg | Freq: Once | INTRAVENOUS | Status: DC

## 2018-03-25 MED ORDER — SODIUM CHLORIDE 0.9 % IV SOLN
Freq: Once | INTRAVENOUS | Status: AC
  Administered 2018-03-25: 10:00:00 via INTRAVENOUS
  Filled 2018-03-25: qty 250

## 2018-03-25 MED ORDER — ONDANSETRON HCL 4 MG/2ML IJ SOLN
8.0000 mg | Freq: Once | INTRAMUSCULAR | Status: AC
  Administered 2018-03-25: 8 mg via INTRAVENOUS
  Filled 2018-03-25: qty 4

## 2018-03-25 MED ORDER — SODIUM CHLORIDE 0.9 % IV SOLN
5.0000 mg/kg | Freq: Once | INTRAVENOUS | Status: AC
  Administered 2018-03-25: 250 mg via INTRAVENOUS
  Filled 2018-03-25: qty 10

## 2018-03-25 MED ORDER — ONDANSETRON HCL 8 MG PO TABS: ORAL_TABLET | ORAL | 0 refills | Status: DC

## 2018-03-25 MED ORDER — SODIUM CHLORIDE 0.9 % IV SOLN
2400.0000 mg/m2 | INTRAVENOUS | Status: DC
  Administered 2018-03-25: 3500 mg via INTRAVENOUS
  Filled 2018-03-25: qty 20

## 2018-03-25 MED ORDER — DEXAMETHASONE SODIUM PHOSPHATE 10 MG/ML IJ SOLN
10.0000 mg | Freq: Once | INTRAMUSCULAR | Status: AC
  Administered 2018-03-25: 10 mg via INTRAVENOUS
  Filled 2018-03-25: qty 1

## 2018-03-25 NOTE — Assessment & Plan Note (Addendum)
#   Colon cancer- cecal/ right-sided; STAGE IV; currently  on FOLFIRI + avastin [since July 2019]. OCT 3rd 2019- CT- slight increase liver lesion ~1.2 x1.2cm; stable mesenteric/L-2 disease.  CEA -15; rising.   #Proceed with 5-FU plus Avastin; Labs today reviewed;  acceptable for treatment today.  Will plan MRI of liver given rising  CEA- for possible local therapies. Will discuss re: NGS/omniseq at next visit.   #Diarrhea grade 1/sec to iri; improved.  Continue Lomotil/Imodium as needed.  # Hypokalemia-i stable continue potassium 20 mg once a day.  # back pain-continue norco prn.  Stable.   #Peripheral neuropathy grade 2-continue gabapentin plus Cymbalta. Stable.   # Treatment today-  5FU+ avastin; # Follow up in - 2 weeks; MD/ labs- cbc/cmp/cea/UA/ chemo-5FU+ avastin; pump  off in 2days; MRI liver asap-Dr.B

## 2018-03-25 NOTE — Progress Notes (Signed)
[place] OFFICE PROGRESS NOTE  Patient Care Team: Coral Spikes, DO as PCP - General (Family Medicine)  Cancer Staging No matching staging information was found for the patient.   Oncology History   # [facility] 2017- COLON CANCER- STAGE IV [s/p right hemi-colectomy; pT4apN2 (7/19LN)]; Pre-op CEA- [id number]-  PET-  mesenteric adenopathy/mediastinal adenopathy/right-sided subclavicular lymph node.    April 17th -START FOLFOX; May 1st Add Avastin;   Aug 16th- Disc OX [sec PN]; con 5FU-Avastin; NOV 27th CT C/A/P- CR; except for 29m LUL; 5FU-Avastin q 3 W [Poor tolerance to FOLFOX].  # June/July 2019- Progression of Liver/Abd LN; July 2019- START FOLFIRI + Avastin; STOP iri 02/25/2018- sec to diarrhea; cont Avastin + 5FU  # March 2018- L2 uptake MET s/p RT [[facility]2018]; mid April X-geva   # MOLECULAR TESTING: K-ras-EXON 2- MUTATED; MSI- STABLE.   DIAGNOSIS: colon ca  STAGE:  IV ;GOALS: pallaitive  CURRENT/MOST RECENT THERAPY: 5FU+Avastin      Cancer of ascending colon metastatic to intra-abdominal lymph node (HCC)      INTERVAL HISTORY:  Nancy RAHIMI89y.o.  female pleasant patient above history of metastatic adenocarcinoma of the colon currently on 5-FU plus Avastin  Since discontinuation of irinotecan 2 weeks ago patient's diarrhea is improved.   Patient continues to have intermittent nausea.  Otherwise no vomiting.  Chronic mild tingling and numbness in the extremities.  Not any worse.  Chronic mild back pain.  Review of Systems  Constitutional: Positive for malaise/fatigue. Negative for chills, diaphoresis, fever and weight loss.  HENT: Negative for nosebleeds and sore throat.   Eyes: Negative for double vision.  Respiratory: Negative for cough, hemoptysis, sputum production, shortness of breath and wheezing.   Cardiovascular: Negative for chest pain, palpitations, orthopnea and leg swelling.  Gastrointestinal: Positive for diarrhea. Negative for  abdominal pain, blood in stool, constipation, heartburn, melena, nausea and vomiting.  Genitourinary: Negative for dysuria, frequency and urgency.  Musculoskeletal: Positive for back pain. Negative for joint pain.  Skin: Negative.  Negative for itching and rash.  Neurological: Positive for tingling. Negative for dizziness, focal weakness, weakness and headaches.  Endo/Heme/Allergies: Does not bruise/bleed easily.  Psychiatric/Behavioral: Negative for depression. The patient is not nervous/anxious and does not have insomnia.       PAST MEDICAL HISTORY :  Past Medical History:  Diagnosis Date  . [place] pox   . [place]    Partial colectomy 07/2015 + chemo tx's  . Hypertension   . Hypokalemia   . Menopause    > 5 yrs  . Peripheral neuropathy due to chemotherapy ([facility]     PAST SURGICAL HISTORY :   Past Surgical History:  Procedure Laterality Date  . BREAST BIOPSY Right    2007? pt unsure done by [facility]  . COLONOSCOPY    . COLONOSCOPY WITH PROPOFOL N/A 10/14/2015   Procedure: COLONOSCOPY WITH PROPOFOL;  Surgeon: PHulen Luster MD;  [facility]  Service: Endoscopy;  Laterality: N/A;  . COLOSTOMY REVISION Right 08/09/2015   Procedure: COLON RESECTION RIGHT;  Surgeon: RFlorene Glen MD;  Location: [facility] ORS;  Service: General;  Laterality: Right;  . ECTOPIC PREGNANCY SURGERY    . PORTACATH PLACEMENT N/A 09/01/2015   Procedure: INSERTION PORT-A-CATH;  Surgeon: RFlorene Glen MD;  Location: [facility] ORS;  Service: General;  Laterality: N/A;  . SHOULDER SURGERY Left     FAMILY HISTORY :   Family History  Problem Relation Age of Onset  .  Hypertension Mother   . Arthritis Father   . Prostate cancer Father   . Diabetes Maternal Grandmother   . Colon cancer Maternal Grandfather        colon    SOCIAL HISTORY:   Social History   Tobacco Use  . Smoking status: Former Smoker    Packs/day: 0.25    Years: 2.00    Pack years: 0.50  . Smokeless tobacco: Never Used   . Tobacco comment: recently quit  Substance Use Topics  . Alcohol use: No    Alcohol/week: 0.0 standard drinks  . Drug use: No    ALLERGIES:  has No Known Allergies.  MEDICATIONS:  Current Outpatient Medications  Medication Sig Dispense Refill  . acetaminophen (TYLENOL) 325 MG tablet Take 2 tablets (650 mg total) by mouth every 6 (six) hours as needed for mild pain (or Fever >/= 101).    . benazepril (LOTENSIN) 20 MG tablet TAKE 1 TABLET DAILY 90 tablet 1  . diphenoxylate-atropine (LOMOTIL) 2.5-0.025 MG tablet Take 1 tablet by mouth 4 (four) times daily as needed for diarrhea or loose stools. Take it along with immodium 40 tablet 4  . DULoxetine (CYMBALTA) 20 MG capsule Take 2 capsules (40 mg total) by mouth daily. 180 capsule 0  . feeding supplement (BOOST HIGH PROTEIN) LIQD Take 1 Container by mouth daily.    . ferrous sulfate (FERROUSUL) 325 (65 FE) MG tablet Take 1 tablet (325 mg total) by mouth daily with breakfast. 90 tablet 3  . gabapentin (NEURONTIN) 300 MG capsule One pill in am; and 2 pills at night prior to sleep. 90 capsule 3  . hydrochlorothiazide (MICROZIDE) 12.5 MG capsule TAKE 1 CAPSULE DAILY 30 capsule 6  . HYDROcodone-acetaminophen (NORCO/VICODIN) 5-325 MG tablet Take 1 tablet by mouth every 12 (twelve) hours as needed for moderate pain. 60 tablet 0  . lidocaine (LIDODERM) 5 % Place 1 patch onto the skin daily. Remove & Discard patch within 12 hours or as directed by MD 30 patch 2  . lidocaine-prilocaine (EMLA) cream Apply 1 application topically as needed. Apply generously over the Mediport 45 minutes prior to chemotherapy. 30 g 6  . loperamide (IMODIUM) 2 MG capsule Take 2 mg by mouth as needed for diarrhea or loose stools.    . Multiple Vitamins-Minerals (MULTIVITAMIN ADULT PO) Take 1 tablet by mouth daily.    . potassium chloride SA (K-DUR,KLOR-CON) 20 MEQ tablet 1 pill twice a day 60 tablet 3  . prochlorperazine (COMPAZINE) 10 MG tablet Take 1 tablet (10 mg total)  by mouth every 6 (six) hours as needed for nausea or vomiting. 90 tablet 1  . ondansetron (ZOFRAN) 8 MG tablet One pill every 8 hours as needed for nausea/vomitting. 60 tablet 0   No current facility-administered medications for this visit.    Facility-Administered Medications Ordered in Other Visits  Medication Dose Route Frequency Provider Last Rate Last Dose  . fluorouracil (ADRUCIL) 3,500 mg in sodium chloride 0.9 % 80 mL chemo infusion  2,400 mg/m2 (Treatment Plan Recorded) Intravenous 1 day or 1 dose Charlaine Dalton R, MD   3,500 mg at 03/25/18 1114  . sodium chloride flush (NS) 0.9 % injection 10 mL  10 mL Intravenous PRN Cammie Sickle, MD   10 mL at 10/04/15 0901  . sodium chloride flush (NS) 0.9 % injection 10 mL  10 mL Intravenous PRN Cammie Sickle, MD   10 mL at 07/02/17 1020    PHYSICAL EXAMINATION: ECOG PERFORMANCE STATUS: 1 -  Symptomatic but completely ambulatory  BP 96/67 (BP Location: Left Arm, Patient Position: Sitting)   Pulse 71   Temp (!) 97.4 F (36.3 C) (Tympanic)   Resp 16   Wt 99 lb 9.6 oz (45.2 kg)   LMP  (LMP Unknown) Comment: LMP MORE THAN 5 YRS  BMI 19.45 kg/m   Filed Weights   03/25/18 0843  Weight: 99 lb 9.6 oz (45.2 kg)    Physical Exam  Constitutional: She is oriented to person, place, and time and well-developed, well-nourished, and in no distress.  She is accompanied by her mother.  She is walking herself.  HENT:  Head: Normocephalic and atraumatic.  Mouth/Throat: Oropharynx is clear and moist. No oropharyngeal exudate.  Eyes: Pupils are equal, round, and reactive to light.  Neck: Normal range of motion. Neck supple.  Cardiovascular: Normal rate and regular rhythm.  Pulmonary/Chest: No respiratory distress. She has no wheezes.  Abdominal: Soft. Bowel sounds are normal. She exhibits no distension and no mass. There is no tenderness. There is no rebound and no guarding.  Musculoskeletal: Normal range of motion. She exhibits  no edema or tenderness.  Neurological: She is alert and oriented to person, place, and time.  Skin: Skin is warm.  Psychiatric: Affect normal.       LABORATORY DATA:  I have reviewed the data as listed    Component Value Date/Time   NA 137 03/25/2018 0825   K 3.6 03/25/2018 0825   CL 106 03/25/2018 0825   CO2 24 03/25/2018 0825   GLUCOSE 101 (H) 03/25/2018 0825   BUN 14 03/25/2018 0825   CREATININE 0.88 03/25/2018 0825   CALCIUM 8.8 (L) 03/25/2018 0825   PROT 6.5 03/25/2018 0825   ALBUMIN 3.6 03/25/2018 0825   AST 15 03/25/2018 0825   ALT 10 03/25/2018 0825   ALKPHOS 81 03/25/2018 0825   BILITOT 0.5 03/25/2018 0825   GFRNONAA >60 03/25/2018 0825   GFRAA >60 03/25/2018 0825    No results found for: SPEP, UPEP  Lab Results  Component Value Date   WBC 7.1 03/25/2018   NEUTROABS 4.6 03/25/2018   HGB 11.4 (L) 03/25/2018   HCT 34.9 (L) 03/25/2018   MCV 93.8 03/25/2018   PLT 148 (L) 03/25/2018      Chemistry      Component Value Date/Time   NA 137 03/25/2018 0825   K 3.6 03/25/2018 0825   CL 106 03/25/2018 0825   CO2 24 03/25/2018 0825   BUN 14 03/25/2018 0825   CREATININE 0.88 03/25/2018 0825      Component Value Date/Time   CALCIUM 8.8 (L) 03/25/2018 0825   ALKPHOS 81 03/25/2018 0825   AST 15 03/25/2018 0825   ALT 10 03/25/2018 0825   BILITOT 0.5 03/25/2018 0825       RADIOGRAPHIC STUDIES: I have personally reviewed the radiological images as listed and agreed with the findings in the report. No results found.   ASSESSMENT & PLAN:  Cancer of ascending colon metastatic to intra-abdominal lymph node [facility]) # Colon cancer- cecal/ right-sided; STAGE IV; currently  on FOLFIRI + avastin [since July 2019]. OCT 3rd 2019- CT- slight increase liver lesion ~1.2 x1.2cm; stable mesenteric/L-2 disease.  CEA -15; rising.   #Proceed with 5-FU plus Avastin; Labs today reviewed;  acceptable for treatment today.  Will plan MRI of liver given rising  CEA- for possible  local therapies. Will discuss re: NGS/omniseq at next visit.   #Diarrhea grade 1/sec to iri; improved.  Continue Lomotil/Imodium as  needed.  # Hypokalemia-i stable continue potassium 20 mg once a day.  # back pain-continue norco prn.  Stable.   #Peripheral neuropathy grade 2-continue gabapentin plus Cymbalta. Stable.   # Treatment today-  5FU+ avastin; # Follow up in - 2 weeks; MD/ labs- cbc/cmp/cea/UA/ chemo-5FU+ avastin; pump  off in 2days; MRI liver asap-Dr.B   Orders Placed This Encounter  Procedures  . MR LIVER W WO CONTRAST    Standing Status:   Future    Standing Expiration Date:   05/26/2019    Order Specific Question:   ** REASON FOR EXAM (FREE TEXT)    Answer:   colon cancer to liver metastases    Order Specific Question:   If indicated for the ordered procedure, I authorize the administration of contrast media per Radiology protocol    Answer:   Yes    Order Specific Question:   What is the patient's sedation requirement?    Answer:   No Sedation    Order Specific Question:   Does the patient have a pacemaker or implanted devices?    Answer:   No    Order Specific Question:   Radiology Contrast Protocol - do NOT remove file path    Answer:   [email or web address]    Order Specific Question:   Preferred imaging location?    Answer:   [facility] (table limit-300lbs)  . CBC with Differential/Platelet    Standing Status:   Future    Standing Expiration Date:   03/26/2019  . Comprehensive metabolic panel    Standing Status:   Future    Standing Expiration Date:   03/26/2019  . CEA    Standing Status:   Future    Standing Expiration Date:   03/26/2019  . Urinalysis, Complete w Microscopic    Standing Status:   Future    Standing Expiration Date:   03/26/2019   All questions were answered. The patient knows to call the clinic with any problems, questions or concerns.      Cammie Sickle, MD 03/25/2018 1:00 PM

## 2018-03-26 LAB — CEA: CEA: 14.3 ng/mL — ABNORMAL HIGH (ref 0.0–4.7)

## 2018-03-27 ENCOUNTER — Inpatient Hospital Stay: Payer: BLUE CROSS/BLUE SHIELD

## 2018-03-27 VITALS — BP 106/67 | HR 80 | Resp 18

## 2018-03-27 DIAGNOSIS — C772 Secondary and unspecified malignant neoplasm of intra-abdominal lymph nodes: Principal | ICD-10-CM

## 2018-03-27 DIAGNOSIS — C182 Malignant neoplasm of ascending colon: Secondary | ICD-10-CM

## 2018-03-27 MED ORDER — HEPARIN SOD (PORK) LOCK FLUSH 100 UNIT/ML IV SOLN
500.0000 [IU] | Freq: Once | INTRAVENOUS | Status: AC | PRN
  Administered 2018-03-27: 500 [IU]

## 2018-03-28 ENCOUNTER — Ambulatory Visit
Admission: RE | Admit: 2018-03-28 | Discharge: 2018-03-28 | Disposition: A | Discharge status: Home / Self Care | Payer: BLUE CROSS/BLUE SHIELD | Source: Ambulatory Visit | Attending: Internal Medicine | Admitting: Internal Medicine

## 2018-03-28 DIAGNOSIS — R590 Localized enlarged lymph nodes: Secondary | ICD-10-CM | POA: Insufficient documentation

## 2018-03-28 DIAGNOSIS — K7689 Other specified diseases of liver: Secondary | ICD-10-CM | POA: Diagnosis not present

## 2018-03-28 DIAGNOSIS — C772 Secondary and unspecified malignant neoplasm of intra-abdominal lymph nodes: Secondary | ICD-10-CM | POA: Diagnosis not present

## 2018-03-28 DIAGNOSIS — C182 Malignant neoplasm of ascending colon: Secondary | ICD-10-CM

## 2018-03-28 DIAGNOSIS — C7951 Secondary malignant neoplasm of bone: Secondary | ICD-10-CM | POA: Insufficient documentation

## 2018-03-28 MED ORDER — GADOBUTROL 1 MMOL/ML IV SOLN
4.5000 mL | Freq: Once | INTRAVENOUS | Status: AC | PRN
  Administered 2018-03-28: 4.5 mL via INTRAVENOUS

## 2018-04-02 ENCOUNTER — Other Ambulatory Visit: Payer: Self-pay | Admitting: Internal Medicine

## 2018-04-05 ENCOUNTER — Other Ambulatory Visit: Payer: Self-pay | Admitting: Internal Medicine

## 2018-04-08 ENCOUNTER — Inpatient Hospital Stay (HOSPITAL_BASED_OUTPATIENT_CLINIC_OR_DEPARTMENT_OTHER): Payer: BLUE CROSS/BLUE SHIELD | Admitting: Internal Medicine

## 2018-04-08 ENCOUNTER — Inpatient Hospital Stay: Payer: BLUE CROSS/BLUE SHIELD

## 2018-04-08 ENCOUNTER — Other Ambulatory Visit: Payer: Self-pay

## 2018-04-08 ENCOUNTER — Encounter: Payer: Self-pay | Admitting: Internal Medicine

## 2018-04-08 VITALS — BP 97/67 | HR 77 | Temp 98.1°F | Resp 20 | Ht 60.0 in | Wt 98.0 lb

## 2018-04-08 DIAGNOSIS — R197 Diarrhea, unspecified: Secondary | ICD-10-CM | POA: Diagnosis not present

## 2018-04-08 DIAGNOSIS — C772 Secondary and unspecified malignant neoplasm of intra-abdominal lymph nodes: Principal | ICD-10-CM

## 2018-04-08 DIAGNOSIS — C182 Malignant neoplasm of ascending colon: Secondary | ICD-10-CM

## 2018-04-08 DIAGNOSIS — G629 Polyneuropathy, unspecified: Secondary | ICD-10-CM

## 2018-04-08 DIAGNOSIS — C7951 Secondary malignant neoplasm of bone: Secondary | ICD-10-CM

## 2018-04-08 DIAGNOSIS — E876 Hypokalemia: Secondary | ICD-10-CM

## 2018-04-08 DIAGNOSIS — C180 Malignant neoplasm of cecum: Secondary | ICD-10-CM | POA: Diagnosis not present

## 2018-04-08 DIAGNOSIS — M549 Dorsalgia, unspecified: Secondary | ICD-10-CM

## 2018-04-08 LAB — COMPREHENSIVE METABOLIC PANEL
ALBUMIN: 3.5 g/dL (ref 3.5–5.0)
ALK PHOS: 81 U/L (ref 38–126)
ALT: 11 U/L (ref 0–44)
ANION GAP: 8 (ref 5–15)
AST: 16 U/L (ref 15–41)
BUN: 9 mg/dL (ref 6–20)
CALCIUM: 9.1 mg/dL (ref 8.9–10.3)
CHLORIDE: 105 mmol/L (ref 98–111)
CO2: 24 mmol/L (ref 22–32)
Creatinine, Ser: 0.83 mg/dL (ref 0.44–1.00)
GFR calc Af Amer: >60 mL/min (ref >60)
GFR calc non Af Amer: >60 mL/min (ref >60)
GLUCOSE: 103 mg/dL — AB (ref 70–99)
Potassium: 3.2 mmol/L — ABNORMAL LOW (ref 3.5–5.1)
Sodium: 137 mmol/L (ref 135–145)
Total Bilirubin: 0.4 mg/dL (ref 0.3–1.2)
Total Protein: 6.6 g/dL (ref 6.5–8.1)

## 2018-04-08 LAB — CBC WITH DIFFERENTIAL/PLATELET
ABS IMMATURE GRANULOCYTES: 0.03 10*3/uL (ref 0.00–0.07)
Basophils Absolute: 0.1 10*3/uL (ref 0.0–0.1)
Basophils Relative: 1 %
EOS ABS: 0.2 10*3/uL (ref 0.0–0.5)
Eosinophils Relative: 3 %
HEMATOCRIT: 35.9 % — AB (ref 36.0–46.0)
HEMOGLOBIN: 11.7 g/dL — AB (ref 12.0–15.0)
Immature Granulocytes: 0 %
LYMPHS ABS: 1.3 10*3/uL (ref 0.7–4.0)
LYMPHS PCT: 18 %
MCH: 30.5 pg (ref 26.0–34.0)
MCHC: 32.6 g/dL (ref 30.0–36.0)
MCV: 93.5 fL (ref 80.0–100.0)
MONOS PCT: 9 %
Monocytes Absolute: 0.7 10*3/uL (ref 0.1–1.0)
NEUTROS PCT: 69 %
Neutro Abs: 5.2 10*3/uL (ref 1.7–7.7)
Platelets: 144 10*3/uL — ABNORMAL LOW (ref 150–400)
RBC: 3.84 MIL/uL — ABNORMAL LOW (ref 3.87–5.11)
RDW: 17.2 % — AB (ref 11.5–15.5)
WBC: 7.5 10*3/uL (ref 4.0–10.5)
nRBC: 0 % (ref 0.0–0.2)

## 2018-04-08 LAB — URINALYSIS, COMPLETE (UACMP) WITH MICROSCOPIC
Bacteria, UA: NONE SEEN
Bilirubin Urine: NEGATIVE
Glucose, UA: NEGATIVE mg/dL
Hgb urine dipstick: NEGATIVE
Ketones, ur: NEGATIVE mg/dL
LEUKOCYTES UA: NEGATIVE
Nitrite: NEGATIVE
Protein, ur: NEGATIVE mg/dL
SPECIFIC GRAVITY, URINE: 1.008 (ref 1.005–1.030)
pH: 5 (ref 5.0–8.0)

## 2018-04-08 MED ORDER — DEXAMETHASONE SODIUM PHOSPHATE 10 MG/ML IJ SOLN
10.0000 mg | Freq: Once | INTRAMUSCULAR | Status: AC
  Administered 2018-04-08: 10 mg via INTRAVENOUS
  Filled 2018-04-08: qty 1

## 2018-04-08 MED ORDER — ATROPINE SULFATE 1 MG/ML IJ SOLN
0.5000 mg | Freq: Once | INTRAMUSCULAR | Status: AC | PRN
  Administered 2018-04-08: 0.5 mg via INTRAVENOUS
  Filled 2018-04-08: qty 1

## 2018-04-08 MED ORDER — SODIUM CHLORIDE 0.9 % IV SOLN
Freq: Once | INTRAVENOUS | Status: AC
  Administered 2018-04-08: 10:00:00 via INTRAVENOUS
  Filled 2018-04-08: qty 250

## 2018-04-08 MED ORDER — SODIUM CHLORIDE 0.9 % IV SOLN
2400.0000 mg/m2 | INTRAVENOUS | Status: DC
  Administered 2018-04-08: 3500 mg via INTRAVENOUS
  Filled 2018-04-08: qty 70

## 2018-04-08 MED ORDER — SODIUM CHLORIDE 0.9 % IV SOLN: 8.0000 mg | Freq: Once | INTRAVENOUS | Status: DC

## 2018-04-08 MED ORDER — SODIUM CHLORIDE 0.9% FLUSH
10.0000 mL | INTRAVENOUS | Status: DC | PRN
  Administered 2018-04-08: 10 mL via INTRAVENOUS
  Filled 2018-04-08: qty 10

## 2018-04-08 MED ORDER — ONDANSETRON HCL 4 MG/2ML IJ SOLN
8.0000 mg | Freq: Once | INTRAMUSCULAR | Status: AC
  Administered 2018-04-08: 8 mg via INTRAVENOUS
  Filled 2018-04-08: qty 4

## 2018-04-08 MED ORDER — HEPARIN SOD (PORK) LOCK FLUSH 100 UNIT/ML IV SOLN: 500.0000 [IU] | Freq: Once | INTRAVENOUS | Status: DC

## 2018-04-08 NOTE — Assessment & Plan Note (Addendum)
#   Colon cancer- cecal/ right-sided; STAGE IV; OCT 3rd 2019- CT- slight increase liver lesion ~1.2 x1.2cm; stable mesenteric/L-2 disease; MRI NOV 2019- Stable/improved liver lesion; increasing L-2 lesion.  CEA -12-15; STABLE.  Currently on 5-FU plus Avastin.  # Continue with 5-FU; HOLD Avastin today [given rectal bleeding/ in anticipation of L-2 ablation/osteocool].   # worsening back pain- see above/ on hydrocodone 1-2 day. Recommend x-geva SQ in 2 weeks.   # Hypokalemia--3.2 potassium continue potassium 20 mg once a day.  Stable.  #Peripheral neuropathy grade 2-continue gabapentin plus Cymbalta.stable  # Treatment today-  5FU; HOLD avastin.  # referral to IR re: ablation to L2- # Follow up in - 2 weeks; MD/ labs- cbc/cmp/cea chemo-5FU pump; X-geva; pump off in 2 days- Dr.B

## 2018-04-08 NOTE — Progress Notes (Signed)
[place] OFFICE PROGRESS NOTE  Patient Care Team: Coral Spikes, DO as PCP - General (Family Medicine)  Cancer Staging No matching staging information was found for the patient.   Oncology History   # [facility] 2017- COLON CANCER- STAGE IV [s/p right hemi-colectomy; pT4apN2 (7/19LN)]; Pre-op CEA- [id number]  PET-  mesenteric adenopathy/mediastinal adenopathy/right-sided subclavicular lymph node.    April 17th -START FOLFOX; May 1st Add Avastin;   Aug 16th- Disc OX [sec PN]; con 5FU-Avastin; NOV 27th CT C/A/P- CR; except for 22m LUL; 5FU-Avastin q 3 W [Poor tolerance to FOLFOX].  # June/July 2019- Progression of Liver/Abd LN; July 2019- START FOLFIRI + Avastin; STOP iri 02/25/2018- sec to diarrhea; cont Avastin + 5FU  # March 2018- L2 uptake MET s/p RT [[facility]2018]; mid April X-geva   # MOLECULAR TESTING: K-ras-EXON 2- MUTATED; MSI- STABLE.   DIAGNOSIS: colon ca  STAGE:  IV ;GOALS: pallaitive  CURRENT/MOST RECENT THERAPY: 5FU+Avastin      Cancer of ascending colon metastatic to intra-abdominal lymph node (HCC)      INTERVAL HISTORY:  MBRITANNI YARDE59y.o.  female pleasant patient above history of metastatic adenocarcinoma of the colon currently on 5-FU plus Avastin/is here to review the results of the MRI of the liver.  Patient denies any worsening diarrhea.  Admits to poor appetite.  Positive weight loss.  Chronic mild tingling and numbness in extremities.  Continues to complain of back pain.  However she has been taking narcotic pain medication 1 to 2 pills a day as needed.  Patient had episode of rectal bleeding x1 episode last week on wiping.  Otherwise no profuse rectal bleeding.  Review of Systems  Constitutional: Positive for malaise/fatigue. Negative for chills, diaphoresis, fever and weight loss.  HENT: Negative for nosebleeds and sore throat.   Eyes: Negative for double vision.  Respiratory: Negative for cough, hemoptysis, sputum production,  shortness of breath and wheezing.   Cardiovascular: Negative for chest pain, palpitations, orthopnea and leg swelling.  Gastrointestinal: Positive for blood in stool. Negative for abdominal pain, constipation, heartburn, melena, nausea and vomiting.  Genitourinary: Negative for dysuria, frequency and urgency.  Musculoskeletal: Positive for back pain. Negative for joint pain.  Skin: Negative.  Negative for itching and rash.  Neurological: Positive for tingling. Negative for dizziness, focal weakness, weakness and headaches.  Endo/Heme/Allergies: Does not bruise/bleed easily.  Psychiatric/Behavioral: Negative for depression. The patient is not nervous/anxious and does not have insomnia.       PAST MEDICAL HISTORY :  Past Medical History:  Diagnosis Date  . Chicken pox   . Colon cancer ([place]    Partial colectomy 07/2015 + chemo tx's  . Hypertension   . Hypokalemia   . Menopause    > 5 yrs  . Peripheral neuropathy due to chemotherapy ([facility]     PAST SURGICAL HISTORY :   Past Surgical History:  Procedure Laterality Date  . BREAST BIOPSY Right    2007? pt unsure done by [facility]  . COLONOSCOPY    . COLONOSCOPY WITH PROPOFOL N/A 10/14/2015   Procedure: COLONOSCOPY WITH PROPOFOL;  Surgeon: PHulen Luster MD;  [facility]  Service: Endoscopy;  Laterality: N/A;  . COLOSTOMY REVISION Right 08/09/2015   Procedure: COLON RESECTION RIGHT;  Surgeon: RFlorene Glen MD;  Location: [facility] ORS;  Service: General;  Laterality: Right;  . ECTOPIC PREGNANCY SURGERY    . PORTACATH PLACEMENT N/A 09/01/2015   Procedure: INSERTION PORT-A-CATH;  Surgeon: RFlorene Glen  MD;  Location: [facility] ORS;  Service: General;  Laterality: N/A;  . SHOULDER SURGERY Left     FAMILY HISTORY :   Family History  Problem Relation Age of Onset  . Hypertension Mother   . Arthritis Father   . Prostate cancer Father   . Diabetes Maternal Grandmother   . Colon cancer Maternal Grandfather        colon    SOCIAL  HISTORY:   Social History   Tobacco Use  . Smoking status: Former Smoker    Packs/day: 0.25    Years: 2.00    Pack years: 0.50  . Smokeless tobacco: Never Used  . Tobacco comment: recently quit  Substance Use Topics  . Alcohol use: No    Alcohol/week: 0.0 standard drinks  . Drug use: No    ALLERGIES:  has No Known Allergies.  MEDICATIONS:  Current Outpatient Medications  Medication Sig Dispense Refill  . acetaminophen (TYLENOL) 325 MG tablet Take 2 tablets (650 mg total) by mouth every 6 (six) hours as needed for mild pain (or Fever >/= 101).    . benazepril (LOTENSIN) 20 MG tablet TAKE 1 TABLET DAILY 90 tablet 4  . diphenoxylate-atropine (LOMOTIL) 2.5-0.025 MG tablet Take 1 tablet by mouth 4 (four) times daily as needed for diarrhea or loose stools. Take it along with immodium 40 tablet 4  . DULoxetine (CYMBALTA) 20 MG capsule Take 2 capsules (40 mg total) by mouth daily. 180 capsule 0  . feeding supplement (BOOST HIGH PROTEIN) LIQD Take 1 Container by mouth daily.    . ferrous sulfate (FERROUSUL) 325 (65 FE) MG tablet Take 1 tablet (325 mg total) by mouth daily with breakfast. 90 tablet 3  . gabapentin (NEURONTIN) 300 MG capsule One pill in am; and 2 pills at night prior to sleep. 90 capsule 3  . hydrochlorothiazide (MICROZIDE) 12.5 MG capsule TAKE 1 CAPSULE DAILY 30 capsule 6  . HYDROcodone-acetaminophen (NORCO/VICODIN) 5-325 MG tablet Take 1 tablet by mouth every 12 (twelve) hours as needed for moderate pain. 60 tablet 0  . lidocaine (LIDODERM) 5 % Place 1 patch onto the skin daily. Remove & Discard patch within 12 hours or as directed by MD 30 patch 2  . lidocaine-prilocaine (EMLA) cream Apply 1 application topically as needed. Apply generously over the Mediport 45 minutes prior to chemotherapy. 30 g 6  . loperamide (IMODIUM) 2 MG capsule Take 2 mg by mouth as needed for diarrhea or loose stools.    . Multiple Vitamins-Minerals (MULTIVITAMIN ADULT PO) Take 1 tablet by mouth  daily.    . ondansetron (ZOFRAN) 8 MG tablet One pill every 8 hours as needed for nausea/vomitting. 60 tablet 0  . potassium chloride SA (K-DUR,KLOR-CON) 20 MEQ tablet 1 pill twice a day 60 tablet 3  . prochlorperazine (COMPAZINE) 10 MG tablet Take 1 tablet (10 mg total) by mouth every 6 (six) hours as needed for nausea or vomiting. 90 tablet 1   No current facility-administered medications for this visit.    Facility-Administered Medications Ordered in Other Visits  Medication Dose Route Frequency Provider Last Rate Last Dose  . fluorouracil (ADRUCIL) 3,500 mg in sodium chloride 0.9 % 80 mL chemo infusion  2,400 mg/m2 (Treatment Plan Recorded) Intravenous 1 day or 1 dose Charlaine Dalton R, MD   3,500 mg at 04/08/18 1135  . heparin lock flush 100 unit/mL  500 Units Intravenous Once Charlaine Dalton R, MD      . sodium chloride flush (NS) 0.9 % injection  10 mL  10 mL Intravenous PRN Cammie Sickle, MD   10 mL at 10/04/15 0901  . sodium chloride flush (NS) 0.9 % injection 10 mL  10 mL Intravenous PRN Cammie Sickle, MD   10 mL at 07/02/17 1020  . sodium chloride flush (NS) 0.9 % injection 10 mL  10 mL Intravenous PRN Cammie Sickle, MD   10 mL at 04/08/18 0836    PHYSICAL EXAMINATION: ECOG PERFORMANCE STATUS: 1 - Symptomatic but completely ambulatory  BP 97/67   Pulse 77   Temp 98.1 F (36.7 C) (Oral)   Resp 20   Ht 5' (1.524 m)   Wt 98 lb (44.5 kg)   LMP  (LMP Unknown) Comment: LMP MORE THAN 5 YRS  BMI 19.14 kg/m   Filed Weights   04/08/18 0901  Weight: 98 lb (44.5 kg)    Physical Exam  Constitutional: She is oriented to person, place, and time and well-developed, well-nourished, and in no distress.  She is accompanied by her mother.  She is walking herself.  HENT:  Head: Normocephalic and atraumatic.  Mouth/Throat: Oropharynx is clear and moist. No oropharyngeal exudate.  Eyes: Pupils are equal, round, and reactive to light.  Neck: Normal range of  motion. Neck supple.  Cardiovascular: Normal rate and regular rhythm.  Pulmonary/Chest: No respiratory distress. She has no wheezes.  Abdominal: Soft. Bowel sounds are normal. She exhibits no distension and no mass. There is no tenderness. There is no rebound and no guarding.  Musculoskeletal: Normal range of motion. She exhibits no edema or tenderness.  Neurological: She is alert and oriented to person, place, and time.  Skin: Skin is warm.  Psychiatric: Affect normal.       LABORATORY DATA:  I have reviewed the data as listed    Component Value Date/Time   NA 137 04/08/2018 0830   K 3.2 (L) 04/08/2018 0830   CL 105 04/08/2018 0830   CO2 24 04/08/2018 0830   GLUCOSE 103 (H) 04/08/2018 0830   BUN 9 04/08/2018 0830   CREATININE 0.83 04/08/2018 0830   CALCIUM 9.1 04/08/2018 0830   PROT 6.6 04/08/2018 0830   ALBUMIN 3.5 04/08/2018 0830   AST 16 04/08/2018 0830   ALT 11 04/08/2018 0830   ALKPHOS 81 04/08/2018 0830   BILITOT 0.4 04/08/2018 0830   GFRNONAA >60 04/08/2018 0830   GFRAA >60 04/08/2018 0830    No results found for: SPEP, UPEP  Lab Results  Component Value Date   WBC 7.5 04/08/2018   NEUTROABS 5.2 04/08/2018   HGB 11.7 (L) 04/08/2018   HCT 35.9 (L) 04/08/2018   MCV 93.5 04/08/2018   PLT 144 (L) 04/08/2018      Chemistry      Component Value Date/Time   NA 137 04/08/2018 0830   K 3.2 (L) 04/08/2018 0830   CL 105 04/08/2018 0830   CO2 24 04/08/2018 0830   BUN 9 04/08/2018 0830   CREATININE 0.83 04/08/2018 0830      Component Value Date/Time   CALCIUM 9.1 04/08/2018 0830   ALKPHOS 81 04/08/2018 0830   AST 16 04/08/2018 0830   ALT 11 04/08/2018 0830   BILITOT 0.4 04/08/2018 0830       RADIOGRAPHIC STUDIES: I have personally reviewed the radiological images as listed and agreed with the findings in the report. No results found.   ASSESSMENT & PLAN:  Cancer of ascending colon metastatic to intra-abdominal lymph node [facility]) # Colon cancer- cecal/  right-sided;  STAGE IV; OCT 3rd 2019- CT- slight increase liver lesion ~1.2 x1.2cm; stable mesenteric/L-2 disease; MRI NOV 2019- Stable/improved liver lesion; increasing L-2 lesion.  CEA -12-15; STABLE.  Currently on 5-FU plus Avastin.  # Continue with 5-FU; HOLD Avastin today [given rectal bleeding/ in anticipation of L-2 ablation/osteocool].   # worsening back pain- see above/ on hydrocodone 1-2 day. Recommend x-geva SQ in 2 weeks.   # Hypokalemia--3.2 potassium continue potassium 20 mg once a day.  Stable.  #Peripheral neuropathy grade 2-continue gabapentin plus Cymbalta.stable  # Treatment today-  5FU; HOLD avastin.  # referral to IR re: ablation to L2- # Follow up in - 2 weeks; MD/ labs- cbc/cmp/cea chemo-5FU pump; X-geva; pump off in 2 days- Dr.B   Orders Placed This Encounter  Procedures  . Ambulatory referral to Interventional Radiology    Referral Priority:   Urgent    Referral Type:   Consultation    Referral Reason:   Specialty Services Required    Requested Specialty:   Interventional Radiology    Number of Visits Requested:   1   All questions were answered. The patient knows to call the clinic with any problems, questions or concerns.      Cammie Sickle, MD 04/08/2018 1:12 PM

## 2018-04-08 NOTE — Progress Notes (Signed)
Potassium: 3.2. MD, Dr. Rogue Bussing, notified and already aware. No new orders.

## 2018-04-08 NOTE — Progress Notes (Signed)
Patient reports an episode of rectal bleeding last evening.

## 2018-04-09 LAB — CEA: CEA1: 13.2 ng/mL — AB (ref 0.0–4.7)

## 2018-04-10 ENCOUNTER — Inpatient Hospital Stay: Payer: BLUE CROSS/BLUE SHIELD

## 2018-04-10 VITALS — BP 96/64 | HR 76 | Temp 95.0°F | Resp 18

## 2018-04-10 DIAGNOSIS — C182 Malignant neoplasm of ascending colon: Secondary | ICD-10-CM

## 2018-04-10 DIAGNOSIS — C772 Secondary and unspecified malignant neoplasm of intra-abdominal lymph nodes: Principal | ICD-10-CM

## 2018-04-10 MED ORDER — HEPARIN SOD (PORK) LOCK FLUSH 100 UNIT/ML IV SOLN
500.0000 [IU] | Freq: Once | INTRAVENOUS | Status: AC | PRN
  Administered 2018-04-10: 500 [IU]

## 2018-04-10 MED ORDER — HEPARIN SOD (PORK) LOCK FLUSH 100 UNIT/ML IV SOLN
INTRAVENOUS | Status: AC
  Filled 2018-04-10: qty 5

## 2018-04-11 ENCOUNTER — Other Ambulatory Visit: Payer: BLUE CROSS/BLUE SHIELD

## 2018-04-11 NOTE — Progress Notes (Signed)
Tumor Board Documentation  Nancy Clark was presented by *Dr Rogue Bussing at our Tumor Board on 04/11/2018, which included representatives from medical oncology, radiation oncology, surgical, radiology, pathology, navigation, palliative care, research, genetics, pharmacy, pulmonology.  Nancy Clark currently presents as a current patient with history of the following treatments: adjuvant chemotherapy, surgical intervention(s).  Additionally, we reviewed previous medical and familial history, history of present illness, and recent lab results along with all available histopathologic and imaging studies. The tumor board considered available treatment options and made the following recommendations:   Dedicated MRI Lumbar Spine, then Interventional Radiology for the L2 lesion  The following procedures/referrals were also placed: No orders of the defined types were placed in this encounter.   Clinical Trial Status: not discussed   Staging used: AJCC Stage Group  National site-specific guidelines NCCN were discussed with respect to the case.  Tumor board is a meeting of clinicians from various specialty areas who evaluate and discuss patients for whom a multidisciplinary approach is being considered. Final determinations in the plan of care are those of the provider(s). The responsibility for follow up of recommendations given during tumor board is that of the provider.   Today's extended care, comprehensive team conference, Nancy Clark was not present for the discussion and was not examined.   Multidisciplinary Tumor Board is a multidisciplinary case peer review process.  Decisions discussed in the Multidisciplinary Tumor Board reflect the opinions of the specialists present at the conference without having examined the patient.  Ultimately, treatment and diagnostic decisions rest with the primary provider(s) and the patient.

## 2018-04-15 ENCOUNTER — Telehealth: Payer: Self-pay | Admitting: Internal Medicine

## 2018-04-15 ENCOUNTER — Other Ambulatory Visit: Payer: Self-pay | Admitting: Internal Medicine

## 2018-04-15 DIAGNOSIS — C7951 Secondary malignant neoplasm of bone: Secondary | ICD-10-CM

## 2018-04-15 DIAGNOSIS — C182 Malignant neoplasm of ascending colon: Secondary | ICD-10-CM

## 2018-04-15 DIAGNOSIS — S32000A Wedge compression fracture of unspecified lumbar vertebra, initial encounter for closed fracture: Secondary | ICD-10-CM

## 2018-04-15 DIAGNOSIS — C772 Secondary and unspecified malignant neoplasm of intra-abdominal lymph nodes: Principal | ICD-10-CM

## 2018-04-15 NOTE — Telephone Encounter (Signed)
Heather/Brooke- Please inform patient that case was discussed with the tumor conference; and felt that she would be a candidate for ablation/IR as discussed at last visit.  However recommend dedicated MRI of the lumbar spine  #Please schedule MRI ASAP.  Also please check into IR appointment /not scheduled yet.  Thank you.  GB

## 2018-04-15 NOTE — Telephone Encounter (Signed)
[facility] Jacqualyn [facility]osey (office phone: [phone]). Arrival at 1245 pm for a 1 pm. Pt will need to go to [facility] at [address]/ [place], [place]

## 2018-04-15 NOTE — Addendum Note (Signed)
Addended by: Sabino Gasser on: 04/15/2018 09:08 AM   Modules accepted: Orders

## 2018-04-16 NOTE — Telephone Encounter (Signed)
Apt 12/18 with IR

## 2018-04-16 NOTE — Telephone Encounter (Signed)
Left vm for patient to return my phone call to discuss future apts.

## 2018-04-17 NOTE — Telephone Encounter (Signed)
Call attempt to reach pt to discuss IR apts. Patient did not answer phone. Pt has an apt on Monday. Will discuss at patient's apt on Monday.

## 2018-04-22 ENCOUNTER — Inpatient Hospital Stay: Payer: BLUE CROSS/BLUE SHIELD | Attending: Internal Medicine

## 2018-04-22 ENCOUNTER — Inpatient Hospital Stay (HOSPITAL_BASED_OUTPATIENT_CLINIC_OR_DEPARTMENT_OTHER): Payer: BLUE CROSS/BLUE SHIELD | Admitting: Internal Medicine

## 2018-04-22 ENCOUNTER — Inpatient Hospital Stay: Payer: BLUE CROSS/BLUE SHIELD

## 2018-04-22 ENCOUNTER — Encounter: Payer: Self-pay | Admitting: Internal Medicine

## 2018-04-22 VITALS — BP 106/72 | HR 79 | Resp 16 | Wt 99.6 lb

## 2018-04-22 DIAGNOSIS — C7951 Secondary malignant neoplasm of bone: Secondary | ICD-10-CM

## 2018-04-22 DIAGNOSIS — C182 Malignant neoplasm of ascending colon: Secondary | ICD-10-CM

## 2018-04-22 DIAGNOSIS — E876 Hypokalemia: Secondary | ICD-10-CM | POA: Insufficient documentation

## 2018-04-22 DIAGNOSIS — K625 Hemorrhage of anus and rectum: Secondary | ICD-10-CM

## 2018-04-22 DIAGNOSIS — C772 Secondary and unspecified malignant neoplasm of intra-abdominal lymph nodes: Secondary | ICD-10-CM

## 2018-04-22 DIAGNOSIS — G629 Polyneuropathy, unspecified: Secondary | ICD-10-CM

## 2018-04-22 DIAGNOSIS — Z87891 Personal history of nicotine dependence: Secondary | ICD-10-CM

## 2018-04-22 DIAGNOSIS — I10 Essential (primary) hypertension: Secondary | ICD-10-CM

## 2018-04-22 DIAGNOSIS — Z5111 Encounter for antineoplastic chemotherapy: Secondary | ICD-10-CM | POA: Insufficient documentation

## 2018-04-22 DIAGNOSIS — Z79899 Other long term (current) drug therapy: Secondary | ICD-10-CM | POA: Insufficient documentation

## 2018-04-22 DIAGNOSIS — C180 Malignant neoplasm of cecum: Secondary | ICD-10-CM

## 2018-04-22 DIAGNOSIS — G620 Drug-induced polyneuropathy: Secondary | ICD-10-CM | POA: Diagnosis not present

## 2018-04-22 LAB — COMPREHENSIVE METABOLIC PANEL
ALT: 9 U/L (ref 0–44)
AST: 16 U/L (ref 15–41)
Albumin: 3.3 g/dL — ABNORMAL LOW (ref 3.5–5.0)
Alkaline Phosphatase: 81 U/L (ref 38–126)
Anion gap: 9 (ref 5–15)
BILIRUBIN TOTAL: 0.3 mg/dL (ref 0.3–1.2)
BUN: 13 mg/dL (ref 6–20)
CO2: 25 mmol/L (ref 22–32)
Calcium: 8.7 mg/dL — ABNORMAL LOW (ref 8.9–10.3)
Chloride: 103 mmol/L (ref 98–111)
Creatinine, Ser: 0.88 mg/dL (ref 0.44–1.00)
GFR calc non Af Amer: >60 mL/min (ref >60)
Glucose, Bld: 107 mg/dL — ABNORMAL HIGH (ref 70–99)
POTASSIUM: 3.3 mmol/L — AB (ref 3.5–5.1)
Sodium: 137 mmol/L (ref 135–145)
TOTAL PROTEIN: 6.4 g/dL — AB (ref 6.5–8.1)

## 2018-04-22 LAB — CBC WITH DIFFERENTIAL/PLATELET
Abs Immature Granulocytes: 0.05 10*3/uL (ref 0.00–0.07)
BASOS PCT: 0 %
Basophils Absolute: 0 10*3/uL (ref 0.0–0.1)
EOS ABS: 0.1 10*3/uL (ref 0.0–0.5)
EOS PCT: 1 %
HEMATOCRIT: 34.9 % — AB (ref 36.0–46.0)
Hemoglobin: 11 g/dL — ABNORMAL LOW (ref 12.0–15.0)
Immature Granulocytes: 1 %
LYMPHS ABS: 1 10*3/uL (ref 0.7–4.0)
Lymphocytes Relative: 11 %
MCH: 30.6 pg (ref 26.0–34.0)
MCHC: 31.5 g/dL (ref 30.0–36.0)
MCV: 97.2 fL (ref 80.0–100.0)
MONOS PCT: 8 %
Monocytes Absolute: 0.8 10*3/uL (ref 0.1–1.0)
NRBC: 0 % (ref 0.0–0.2)
Neutro Abs: 7.3 10*3/uL (ref 1.7–7.7)
Neutrophils Relative %: 79 %
PLATELETS: 143 10*3/uL — AB (ref 150–400)
RBC: 3.59 MIL/uL — ABNORMAL LOW (ref 3.87–5.11)
RDW: 17.2 % — AB (ref 11.5–15.5)
WBC: 9.3 10*3/uL (ref 4.0–10.5)

## 2018-04-22 LAB — URINALYSIS, COMPLETE (UACMP) WITH MICROSCOPIC
BACTERIA UA: NONE SEEN
BILIRUBIN URINE: NEGATIVE
GLUCOSE, UA: NEGATIVE mg/dL
Hgb urine dipstick: NEGATIVE
KETONES UR: NEGATIVE mg/dL
Leukocytes, UA: NEGATIVE
Nitrite: NEGATIVE
Protein, ur: NEGATIVE mg/dL
SPECIFIC GRAVITY, URINE: 1.01 (ref 1.005–1.030)
pH: 5 (ref 5.0–8.0)

## 2018-04-22 MED ORDER — SODIUM CHLORIDE 0.9 % IV SOLN
2400.0000 mg/m2 | INTRAVENOUS | Status: DC
  Administered 2018-04-22: 3500 mg via INTRAVENOUS
  Filled 2018-04-22: qty 70

## 2018-04-22 MED ORDER — ATROPINE SULFATE 1 MG/ML IJ SOLN: 0.5000 mg | Freq: Once | INTRAMUSCULAR | Status: DC | PRN

## 2018-04-22 MED ORDER — ONDANSETRON HCL 4 MG/2ML IJ SOLN
8.0000 mg | Freq: Once | INTRAMUSCULAR | Status: AC
  Administered 2018-04-22: 8 mg via INTRAVENOUS
  Filled 2018-04-22: qty 4

## 2018-04-22 MED ORDER — SODIUM CHLORIDE 0.9 % IV SOLN
Freq: Once | INTRAVENOUS | Status: AC
  Administered 2018-04-22: 11:00:00 via INTRAVENOUS
  Filled 2018-04-22: qty 250

## 2018-04-22 MED ORDER — DENOSUMAB 120 MG/1.7ML ~~LOC~~ SOLN
120.0000 mg | Freq: Once | SUBCUTANEOUS | Status: AC
  Administered 2018-04-22: 120 mg via SUBCUTANEOUS
  Filled 2018-04-22: qty 1.7

## 2018-04-22 MED ORDER — DEXAMETHASONE SODIUM PHOSPHATE 10 MG/ML IJ SOLN
10.0000 mg | Freq: Once | INTRAMUSCULAR | Status: AC
  Administered 2018-04-22: 10 mg via INTRAVENOUS
  Filled 2018-04-22: qty 1

## 2018-04-22 NOTE — Progress Notes (Signed)
[place] OFFICE PROGRESS NOTE  Patient Care Team: Nancy Spikes, DO as PCP - General (Family Medicine)  Cancer Staging No matching staging information was found for the patient.   Oncology History   # [facility] 2017- COLON CANCER- STAGE IV [s/p right hemi-colectomy; pT4apN2 (7/19LN)]; Pre-op CEA- [id number]  PET-  mesenteric adenopathy/mediastinal adenopathy/right-sided subclavicular lymph node.    April 17th -START FOLFOX; May 1st Add Avastin;   Aug 16th- Disc OX [sec PN]; con 5FU-Avastin; NOV 27th CT C/A/P- CR; except for 85m LUL; 5FU-Avastin q 3 W [Poor tolerance to FOLFOX].  # June/July 2019- Progression of Liver/Abd LN; July 2019- START FOLFIRI + Avastin; STOP iri 02/25/2018- sec to diarrhea; cont Avastin + 5FU  # March 2018- L2 uptake MET s/p RT [[facility]2018]; mid April X-geva   # MOLECULAR TESTING: K-ras-EXON 2- MUTATED; MSI- STABLE.   DIAGNOSIS: colon ca  STAGE:  IV ;GOALS: pallaitive  CURRENT/MOST RECENT THERAPY: 5FU+Avastin      Cancer of ascending colon metastatic to intra-abdominal lymph node (HCC)      INTERVAL HISTORY:  Nancy CORAL89y.o.  female pleasant patient above history of metastatic adenocarcinoma of the colon currently on 5-FU plus Avastin is here for follow-up.  Patient is Avastin was held at last visit because of rectal bleeding/need for IR procedure.  Patient denies any nausea vomiting diarrhea.  Appetite is fair.  No weight loss.  Continues to complain tingling and numbness in the feet.  Patient has been taking pain medication about 1 to 2 pills a day.   Her blood in stools has resolved.  She has been using hemorrhoidal cream.  Review of Systems  Constitutional: Positive for malaise/fatigue. Negative for chills, diaphoresis, fever and weight loss.  HENT: Negative for nosebleeds and sore throat.   Eyes: Negative for double vision.  Respiratory: Negative for cough, hemoptysis, sputum production, shortness of breath and  wheezing.   Cardiovascular: Negative for chest pain, palpitations, orthopnea and leg swelling.  Gastrointestinal: Positive for blood in stool. Negative for abdominal pain, constipation, heartburn, melena, nausea and vomiting.  Genitourinary: Negative for dysuria, frequency and urgency.  Musculoskeletal: Positive for back pain. Negative for joint pain.  Skin: Negative.  Negative for itching and rash.  Neurological: Positive for tingling. Negative for dizziness, focal weakness, weakness and headaches.  Endo/Heme/Allergies: Does not bruise/bleed easily.  Psychiatric/Behavioral: Negative for depression. The patient is not nervous/anxious and does not have insomnia.       PAST MEDICAL HISTORY :  Past Medical History:  Diagnosis Date  . Chicken pox   . [place] [facility] [place]    Partial colectomy 07/2015 + chemo tx's  . Hypertension   . Hypokalemia   . Menopause    > 5 yrs  . Peripheral neuropathy due to chemotherapy ([facility]     PAST SURGICAL HISTORY :   Past Surgical History:  Procedure Laterality Date  . BREAST BIOPSY Right    2007? pt unsure done by [facility]  . COLONOSCOPY    . COLONOSCOPY WITH PROPOFOL N/A 10/14/2015   Procedure: COLONOSCOPY WITH PROPOFOL;  Surgeon: PHulen Luster MD;  [facility];  Service: Endoscopy;  Laterality: N/A;  . COLOSTOMY REVISION Right 08/09/2015   Procedure: COLON RESECTION RIGHT;  Surgeon: RFlorene Glen MD;  Location: [facility] ORS;  Service: General;  Laterality: Right;  . ECTOPIC PREGNANCY SURGERY    . PORTACATH PLACEMENT N/A 09/01/2015   Procedure: INSERTION PORT-A-CATH;  Surgeon: RFlorene Glen MD;  Location:  [facility] ORS;  Service: General;  Laterality: N/A;  . SHOULDER SURGERY Left     FAMILY HISTORY :   Family History  Problem Relation Age of Onset  . Hypertension Mother   . Arthritis Father   . Prostate cancer Father   . Diabetes Maternal Grandmother   . Colon cancer Maternal Grandfather        colon    SOCIAL HISTORY:   Social History    Tobacco Use  . Smoking status: Former Smoker    Packs/day: 0.25    Years: 2.00    Pack years: 0.50  . Smokeless tobacco: Never Used  . Tobacco comment: recently quit  Substance Use Topics  . Alcohol use: No    Alcohol/week: 0.0 standard drinks  . Drug use: No    ALLERGIES:  has No Known Allergies.  MEDICATIONS:  Current Outpatient Medications  Medication Sig Dispense Refill  . acetaminophen (TYLENOL) 325 MG tablet Take 2 tablets (650 mg total) by mouth every 6 (six) hours as needed for mild pain (or Fever >/= 101).    . benazepril (LOTENSIN) 20 MG tablet TAKE 1 TABLET DAILY 90 tablet 4  . diphenoxylate-atropine (LOMOTIL) 2.5-0.025 MG tablet Take 1 tablet by mouth 4 (four) times daily as needed for diarrhea or loose stools. Take it along with immodium 40 tablet 4  . DULoxetine (CYMBALTA) 20 MG capsule Take 2 capsules (40 mg total) by mouth daily. 180 capsule 0  . feeding supplement (BOOST HIGH PROTEIN) LIQD Take 1 Container by mouth daily.    . ferrous sulfate (FERROUSUL) 325 (65 FE) MG tablet Take 1 tablet (325 mg total) by mouth daily with breakfast. 90 tablet 3  . gabapentin (NEURONTIN) 300 MG capsule One pill in am; and 2 pills at night prior to sleep. 90 capsule 3  . hydrochlorothiazide (MICROZIDE) 12.5 MG capsule TAKE 1 CAPSULE DAILY 30 capsule 6  . HYDROcodone-acetaminophen (NORCO/VICODIN) 5-325 MG tablet Take 1 tablet by mouth every 12 (twelve) hours as needed for moderate pain. 60 tablet 0  . lidocaine (LIDODERM) 5 % Place 1 patch onto the skin daily. Remove & Discard patch within 12 hours or as directed by MD 30 patch 2  . lidocaine-prilocaine (EMLA) cream Apply 1 application topically as needed. Apply generously over the Mediport 45 minutes prior to chemotherapy. 30 g 6  . loperamide (IMODIUM) 2 MG capsule Take 2 mg by mouth as needed for diarrhea or loose stools.    . Multiple Vitamins-Minerals (MULTIVITAMIN ADULT PO) Take 1 tablet by mouth daily.    . ondansetron  (ZOFRAN) 8 MG tablet One pill every 8 hours as needed for nausea/vomitting. 60 tablet 0  . potassium chloride SA (K-DUR,KLOR-CON) 20 MEQ tablet 1 pill twice a day 60 tablet 3  . prochlorperazine (COMPAZINE) 10 MG tablet Take 1 tablet (10 mg total) by mouth every 6 (six) hours as needed for nausea or vomiting. 90 tablet 1   No current facility-administered medications for this visit.    Facility-Administered Medications Ordered in Other Visits  Medication Dose Route Frequency Provider Last Rate Last Dose  . sodium chloride flush (NS) 0.9 % injection 10 mL  10 mL Intravenous PRN Cammie Sickle, MD   10 mL at 10/04/15 0901  . sodium chloride flush (NS) 0.9 % injection 10 mL  10 mL Intravenous PRN Cammie Sickle, MD   10 mL at 07/02/17 1020    PHYSICAL EXAMINATION: ECOG PERFORMANCE STATUS: 1 - Symptomatic but completely ambulatory  BP  106/72 (BP Location: Left Arm, Patient Position: Sitting)   Pulse 79   Resp 16   Wt 99 lb 9.6 oz (45.2 kg)   LMP  (LMP Unknown) Comment: LMP MORE THAN 5 YRS  BMI 19.45 kg/m   Filed Weights   04/22/18 0948  Weight: 99 lb 9.6 oz (45.2 kg)    Physical Exam  Constitutional: She is oriented to person, place, and time and well-developed, well-nourished, and in no distress.  She is accompanied by her mother.  She is walking herself.  HENT:  Head: Normocephalic and atraumatic.  Mouth/Throat: Oropharynx is clear and moist. No oropharyngeal exudate.  Eyes: Pupils are equal, round, and reactive to light.  Neck: Normal range of motion. Neck supple.  Cardiovascular: Normal rate and regular rhythm.  Pulmonary/Chest: No respiratory distress. She has no wheezes.  Abdominal: Soft. Bowel sounds are normal. She exhibits no distension and no mass. There is no tenderness. There is no rebound and no guarding.  Musculoskeletal: Normal range of motion. She exhibits no edema or tenderness.  Neurological: She is alert and oriented to person, place, and time.   Skin: Skin is warm.  Psychiatric: Affect normal.       LABORATORY DATA:  I have reviewed the data as listed    Component Value Date/Time   NA 137 04/22/2018 0920   K 3.3 (L) 04/22/2018 0920   CL 103 04/22/2018 0920   CO2 25 04/22/2018 0920   GLUCOSE 107 (H) 04/22/2018 0920   BUN 13 04/22/2018 0920   CREATININE 0.88 04/22/2018 0920   CALCIUM 8.7 (L) 04/22/2018 0920   PROT 6.4 (L) 04/22/2018 0920   ALBUMIN 3.3 (L) 04/22/2018 0920   AST 16 04/22/2018 0920   ALT 9 04/22/2018 0920   ALKPHOS 81 04/22/2018 0920   BILITOT 0.3 04/22/2018 0920   GFRNONAA >60 04/22/2018 0920   GFRAA >60 04/22/2018 0920    No results found for: SPEP, UPEP  Lab Results  Component Value Date   WBC 9.3 04/22/2018   NEUTROABS 7.3 04/22/2018   HGB 11.0 (L) 04/22/2018   HCT 34.9 (L) 04/22/2018   MCV 97.2 04/22/2018   PLT 143 (L) 04/22/2018      Chemistry      Component Value Date/Time   NA 137 04/22/2018 0920   K 3.3 (L) 04/22/2018 0920   CL 103 04/22/2018 0920   CO2 25 04/22/2018 0920   BUN 13 04/22/2018 0920   CREATININE 0.88 04/22/2018 0920      Component Value Date/Time   CALCIUM 8.7 (L) 04/22/2018 0920   ALKPHOS 81 04/22/2018 0920   AST 16 04/22/2018 0920   ALT 9 04/22/2018 0920   BILITOT 0.3 04/22/2018 0920       RADIOGRAPHIC STUDIES: I have personally reviewed the radiological images as listed and agreed with the findings in the report. No results found.   ASSESSMENT & PLAN:  Cancer of ascending colon metastatic to intra-abdominal lymph node ([place]) # Colon cancer- cecal/ right-sided; STAGE IV; OCT 3rd 2019- CT- slight increase liver lesion ~1.2 x1.2cm; stable mesenteric/L-2 disease; MRI NOV 2019- Stable/improved liver lesion; increasing L-2 lesion.  CEA -12-15; stable.  Currently on 5-FU plus Avastin.  # Continue with 5-FU; HOLD Avastin today [in anticipation of L-2 ablation/osteocool]. MRI of back on 12/14; IR eval on 12/18. Labs today reviewed;  acceptable for treatment  today.   # worsening back pain- see above/ on hydrocodone 1-2 day.  Xgeva subcu today.  Labs normal.  Discussed with tumor  conference.  Awaiting IR evaluation.  # Hypokalemia--3.3 potassium continue potassium 20 mg once a day.  Stable.   # Rectal bleeding-likely hemorrhoidal resolved with prep-H.   #Peripheral neuropathy grade 2-continue gabapentin plus Cymbalta-stable  # DISPOSITION:  # Treatment today-  5FU;  # Follow up in - 2 weeks; MD/ labs- cbc/cmp/cea chemo-5FU pump;pump off in 2 days- Dr.B   Orders Placed This Encounter  Procedures  . CBC with Differential/Platelet    Standing Status:   Standing    Number of Occurrences:   10    Standing Expiration Date:   04/23/2019  . Comprehensive metabolic panel    Standing Status:   Standing    Number of Occurrences:   10    Standing Expiration Date:   04/23/2019  . CEA    Standing Status:   Standing    Number of Occurrences:   10    Standing Expiration Date:   04/23/2019   All questions were answered. The patient knows to call the clinic with any problems, questions or concerns.      Cammie Sickle, MD 04/22/2018 4:58 PM

## 2018-04-22 NOTE — Assessment & Plan Note (Addendum)
#   Colon cancer- cecal/ right-sided; STAGE IV; OCT 3rd 2019- CT- slight increase liver lesion ~1.2 x1.2cm; stable mesenteric/L-2 disease; MRI NOV 2019- Stable/improved liver lesion; increasing L-2 lesion.  CEA -12-15; stable.  Currently on 5-FU plus Avastin.  # Continue with 5-FU; HOLD Avastin today [in anticipation of L-2 ablation/osteocool]. MRI of back on 12/14; IR eval on 12/18. Labs today reviewed;  acceptable for treatment today.   # worsening back pain- see above/ on hydrocodone 1-2 day.  Xgeva subcu today.  Labs normal.  Discussed with tumor conference.  Awaiting IR evaluation.  # Hypokalemia--3.3 potassium continue potassium 20 mg once a day.  Stable.   # Rectal bleeding-likely hemorrhoidal resolved with prep-H.   #Peripheral neuropathy grade 2-continue gabapentin plus Cymbalta-stable  # DISPOSITION:  # Treatment today-  5FU;  # Follow up in - 2 weeks; MD/ labs- cbc/cmp/cea chemo-5FU pump;pump off in 2 days- Dr.B

## 2018-04-23 LAB — CEA: CEA1: 15.1 ng/mL — AB (ref 0.0–4.7)

## 2018-04-24 ENCOUNTER — Inpatient Hospital Stay: Payer: BLUE CROSS/BLUE SHIELD

## 2018-04-24 DIAGNOSIS — C772 Secondary and unspecified malignant neoplasm of intra-abdominal lymph nodes: Principal | ICD-10-CM

## 2018-04-24 DIAGNOSIS — C182 Malignant neoplasm of ascending colon: Secondary | ICD-10-CM

## 2018-04-24 DIAGNOSIS — C180 Malignant neoplasm of cecum: Secondary | ICD-10-CM | POA: Diagnosis not present

## 2018-04-24 MED ORDER — SODIUM CHLORIDE 0.9% FLUSH
10.0000 mL | INTRAVENOUS | Status: DC | PRN
  Administered 2018-04-24: 10 mL
  Filled 2018-04-24: qty 10

## 2018-04-24 MED ORDER — HEPARIN SOD (PORK) LOCK FLUSH 100 UNIT/ML IV SOLN
500.0000 [IU] | Freq: Once | INTRAVENOUS | Status: AC | PRN
  Administered 2018-04-24: 500 [IU]

## 2018-05-04 ENCOUNTER — Ambulatory Visit
Admission: RE | Admit: 2018-05-04 | Discharge: 2018-05-04 | Disposition: A | Discharge status: Home / Self Care | Payer: BLUE CROSS/BLUE SHIELD | Source: Ambulatory Visit | Attending: Internal Medicine | Admitting: Internal Medicine

## 2018-05-04 DIAGNOSIS — C182 Malignant neoplasm of ascending colon: Secondary | ICD-10-CM

## 2018-05-04 DIAGNOSIS — M5136 Other intervertebral disc degeneration, lumbar region: Secondary | ICD-10-CM | POA: Diagnosis not present

## 2018-05-04 DIAGNOSIS — M1288 Other specific arthropathies, not elsewhere classified, other specified site: Secondary | ICD-10-CM | POA: Diagnosis not present

## 2018-05-04 DIAGNOSIS — C7951 Secondary malignant neoplasm of bone: Secondary | ICD-10-CM | POA: Diagnosis not present

## 2018-05-04 DIAGNOSIS — C772 Secondary and unspecified malignant neoplasm of intra-abdominal lymph nodes: Secondary | ICD-10-CM | POA: Diagnosis not present

## 2018-05-04 MED ORDER — GADOBUTROL 1 MMOL/ML IV SOLN
4.5000 mL | Freq: Once | INTRAVENOUS | Status: AC | PRN
  Administered 2018-05-04: 4.5 mL via INTRAVENOUS

## 2018-05-06 ENCOUNTER — Inpatient Hospital Stay: Payer: BLUE CROSS/BLUE SHIELD

## 2018-05-06 ENCOUNTER — Inpatient Hospital Stay (HOSPITAL_BASED_OUTPATIENT_CLINIC_OR_DEPARTMENT_OTHER): Payer: BLUE CROSS/BLUE SHIELD | Admitting: Internal Medicine

## 2018-05-06 VITALS — BP 101/68 | HR 90 | Resp 16 | Wt 99.0 lb

## 2018-05-06 DIAGNOSIS — C7951 Secondary malignant neoplasm of bone: Secondary | ICD-10-CM

## 2018-05-06 DIAGNOSIS — C772 Secondary and unspecified malignant neoplasm of intra-abdominal lymph nodes: Secondary | ICD-10-CM

## 2018-05-06 DIAGNOSIS — C180 Malignant neoplasm of cecum: Secondary | ICD-10-CM

## 2018-05-06 DIAGNOSIS — I10 Essential (primary) hypertension: Secondary | ICD-10-CM

## 2018-05-06 DIAGNOSIS — E876 Hypokalemia: Secondary | ICD-10-CM

## 2018-05-06 DIAGNOSIS — C182 Malignant neoplasm of ascending colon: Secondary | ICD-10-CM

## 2018-05-06 DIAGNOSIS — Z95828 Presence of other vascular implants and grafts: Secondary | ICD-10-CM

## 2018-05-06 DIAGNOSIS — G620 Drug-induced polyneuropathy: Secondary | ICD-10-CM

## 2018-05-06 LAB — COMPREHENSIVE METABOLIC PANEL
ALBUMIN: 3.6 g/dL (ref 3.5–5.0)
ALT: 11 U/L (ref 0–44)
AST: 16 U/L (ref 15–41)
Alkaline Phosphatase: 85 U/L (ref 38–126)
Anion gap: 9 (ref 5–15)
BUN: 10 mg/dL (ref 6–20)
CO2: 22 mmol/L (ref 22–32)
Calcium: 8.8 mg/dL — ABNORMAL LOW (ref 8.9–10.3)
Chloride: 107 mmol/L (ref 98–111)
Creatinine, Ser: 0.69 mg/dL (ref 0.44–1.00)
GFR calc Af Amer: >60 mL/min (ref >60)
GFR calc non Af Amer: >60 mL/min (ref >60)
GLUCOSE: 94 mg/dL (ref 70–99)
POTASSIUM: 3.3 mmol/L — AB (ref 3.5–5.1)
SODIUM: 138 mmol/L (ref 135–145)
Total Bilirubin: 0.4 mg/dL (ref 0.3–1.2)
Total Protein: 6.8 g/dL (ref 6.5–8.1)

## 2018-05-06 LAB — URINALYSIS, COMPLETE (UACMP) WITH MICROSCOPIC
Bacteria, UA: NONE SEEN
Bilirubin Urine: NEGATIVE
GLUCOSE, UA: NEGATIVE mg/dL
Hgb urine dipstick: NEGATIVE
Ketones, ur: NEGATIVE mg/dL
Leukocytes, UA: NEGATIVE
Nitrite: NEGATIVE
Protein, ur: NEGATIVE mg/dL
Specific Gravity, Urine: 1.017 (ref 1.005–1.030)
pH: 5 (ref 5.0–8.0)

## 2018-05-06 LAB — CBC WITH DIFFERENTIAL/PLATELET
ABS IMMATURE GRANULOCYTES: 0.03 10*3/uL (ref 0.00–0.07)
Basophils Absolute: 0.1 10*3/uL (ref 0.0–0.1)
Basophils Relative: 1 %
Eosinophils Absolute: 0.1 10*3/uL (ref 0.0–0.5)
Eosinophils Relative: 2 %
HCT: 37.2 % (ref 36.0–46.0)
Hemoglobin: 11.8 g/dL — ABNORMAL LOW (ref 12.0–15.0)
Immature Granulocytes: 0 %
LYMPHS PCT: 21 %
Lymphs Abs: 1.4 10*3/uL (ref 0.7–4.0)
MCH: 30.5 pg (ref 26.0–34.0)
MCHC: 31.7 g/dL (ref 30.0–36.0)
MCV: 96.1 fL (ref 80.0–100.0)
Monocytes Absolute: 0.6 10*3/uL (ref 0.1–1.0)
Monocytes Relative: 9 %
NEUTROS ABS: 4.6 10*3/uL (ref 1.7–7.7)
Neutrophils Relative %: 67 %
Platelets: 197 10*3/uL (ref 150–400)
RBC: 3.87 MIL/uL (ref 3.87–5.11)
RDW: 17.3 % — ABNORMAL HIGH (ref 11.5–15.5)
WBC: 6.8 10*3/uL (ref 4.0–10.5)
nRBC: 0 % (ref 0.0–0.2)

## 2018-05-06 MED ORDER — SODIUM CHLORIDE 0.9% FLUSH
10.0000 mL | Freq: Once | INTRAVENOUS | Status: AC
  Administered 2018-05-06: 10 mL via INTRAVENOUS
  Filled 2018-05-06: qty 10

## 2018-05-06 MED ORDER — DEXAMETHASONE SODIUM PHOSPHATE 10 MG/ML IJ SOLN
10.0000 mg | Freq: Once | INTRAMUSCULAR | Status: AC
  Administered 2018-05-06: 10 mg via INTRAVENOUS
  Filled 2018-05-06: qty 1

## 2018-05-06 MED ORDER — ONDANSETRON HCL 4 MG/2ML IJ SOLN
8.0000 mg | Freq: Once | INTRAMUSCULAR | Status: AC
  Administered 2018-05-06: 8 mg via INTRAVENOUS
  Filled 2018-05-06: qty 4

## 2018-05-06 MED ORDER — SODIUM CHLORIDE 0.9 % IV SOLN
Freq: Once | INTRAVENOUS | Status: AC
  Administered 2018-05-06: 15:00:00 via INTRAVENOUS
  Filled 2018-05-06: qty 250

## 2018-05-06 MED ORDER — SODIUM CHLORIDE 0.9 % IV SOLN
2400.0000 mg/m2 | INTRAVENOUS | Status: DC
  Administered 2018-05-06: 3500 mg via INTRAVENOUS
  Filled 2018-05-06: qty 70

## 2018-05-06 NOTE — Assessment & Plan Note (Addendum)
#   Colon cancer- cecal/ right-sided; STAGE IV; OCT 3rd 2019- CT- slight STABLE liver lesion; but MRI NOV 2019- Stable/improved liver lesion; increasing L-2 lesion; DEC 2019- MRI increasing lesion in L-2 [see below].  CEA -12-15; stable.  Currently on 5-FU; avastin on HOLD.  # proceed with 5FU infusion today; Labs today reviewed;  acceptable for treatment today; will try to finish sooner given appt in [place].   # Worsening back pain- see above/ on hydrocodone 1-2 day.  Missy Sabins W. Awaiting IR evaluation on 12/18 for osteocool/ablation.   # Hypokalemia-3.3 potassium continue potassium 20 mg once a day.  Stable.   #Peripheral neuropathy grade 2-continue gabapentin plus Cymbalta-stable.   # DISPOSITION:  # Treatment today-  5FU;will need to move sooner [11am] on 12/18 sec to appt in [place].   # Follow up in - 3 weeks; MD/ labs- cbc/cmp/cea chemo-5FU pump; pump off in 2 days- Dr.B

## 2018-05-06 NOTE — Progress Notes (Signed)
[place] OFFICE PROGRESS NOTE  Patient Care Team: Coral Spikes, DO as PCP - General (Family Medicine)  Cancer Staging No matching staging information was found for the patient.   Oncology History   # [facility] 2017- COLON CANCER- STAGE IV [s/p right hemi-colectomy; pT4apN2 (7/19LN)]; Pre-op CEA- [id number]-  PET-  mesenteric adenopathy/mediastinal adenopathy/right-sided subclavicular lymph node.    April 17th -START FOLFOX; May 1st Add Avastin;   Aug 16th- Disc OX [sec PN]; con 5FU-Avastin; NOV 27th CT C/A/P- CR; except for 65m LUL; 5FU-Avastin q 3 W [Poor tolerance to FOLFOX].  # June/July 2019- Progression of Liver/Abd LN; July 2019- START FOLFIRI + Avastin; STOP iri 02/25/2018- sec to diarrhea; cont Avastin + 5FU  # March 2018- L2 uptake MET s/p RT [[facility]2018]; mid April X-geva   # MOLECULAR TESTING: K-ras-EXON 2- MUTATED; MSI- STABLE.   DIAGNOSIS: colon ca  STAGE:  IV ;GOALS: pallaitive  CURRENT/MOST RECENT THERAPY: 5FU+Avastin      Cancer of ascending colon metastatic to intra-abdominal lymph node (HCC)      INTERVAL HISTORY:  Nancy MATLACK89y.o.  female pleasant patient above history of metastatic adenocarcinoma of the colon currently on 5-FU plus Avastin is here for follow-up.  Avastin is on hold because of possible upcoming procedure.  She is here to review the MRI.  Continues to have pain in the back.  However she is taking narcotic pain medication only as needed 1-2 hydrocodone's a day.  Denies any blood in stools black or stools.  Denies any unusual shortness of breath or cough.  No significant diarrhea.   Review of Systems  Constitutional: Positive for malaise/fatigue. Negative for chills, diaphoresis, fever and weight loss.  HENT: Negative for nosebleeds and sore throat.   Eyes: Negative for double vision.  Respiratory: Negative for cough, hemoptysis, sputum production, shortness of breath and wheezing.   Cardiovascular: Negative for  chest pain, palpitations, orthopnea and leg swelling.  Gastrointestinal: Positive for blood in stool. Negative for abdominal pain, constipation, heartburn, melena, nausea and vomiting.  Genitourinary: Negative for dysuria, frequency and urgency.  Musculoskeletal: Positive for back pain. Negative for joint pain.  Skin: Negative.  Negative for itching and rash.  Neurological: Positive for tingling. Negative for dizziness, focal weakness, weakness and headaches.  Endo/Heme/Allergies: Does not bruise/bleed easily.  Psychiatric/Behavioral: Negative for depression. The patient is not nervous/anxious and does not have insomnia.       PAST MEDICAL HISTORY :  Past Medical History:  Diagnosis Date  . Chicken pox   . [facility] ([facility]    Partial colectomy 07/2015 + chemo tx's  . Hypertension   . Hypokalemia   . Menopause    > 5 yrs  . Peripheral neuropathy due to chemotherapy ([facility]     PAST SURGICAL HISTORY :   Past Surgical History:  Procedure Laterality Date  . BREAST BIOPSY Right    2007? pt unsure done by [facility]  . COLONOSCOPY    . COLONOSCOPY WITH PROPOFOL N/A 10/14/2015   Procedure: COLONOSCOPY WITH PROPOFOL;  Surgeon: PHulen Luster MD;  [facility];  Service: Endoscopy;  Laterality: N/A;  . COLOSTOMY REVISION Right 08/09/2015   Procedure: COLON RESECTION RIGHT;  Surgeon: RFlorene Glen MD;  Location: [facility] ORS;  Service: General;  Laterality: Right;  . ECTOPIC PREGNANCY SURGERY    . PORTACATH PLACEMENT N/A 09/01/2015   Procedure: INSERTION PORT-A-CATH;  Surgeon: RFlorene Glen MD;  Location: [facility] ORS;  Service: General;  Laterality: N/A;  . SHOULDER SURGERY Left     FAMILY HISTORY :   Family History  Problem Relation Age of Onset  . Hypertension Mother   . Arthritis Father   . Prostate cancer Father   . Diabetes Maternal Grandmother   . Colon cancer Maternal Grandfather        colon    SOCIAL HISTORY:   Social History   Tobacco Use  . Smoking status: Former  Smoker    Packs/day: 0.25    Years: 2.00    Pack years: 0.50  . Smokeless tobacco: Never Used  . Tobacco comment: recently quit  Substance Use Topics  . Alcohol use: No    Alcohol/week: 0.0 standard drinks  . Drug use: No    ALLERGIES:  has No Known Allergies.  MEDICATIONS:  Current Outpatient Medications  Medication Sig Dispense Refill  . acetaminophen (TYLENOL) 325 MG tablet Take 2 tablets (650 mg total) by mouth every 6 (six) hours as needed for mild pain (or Fever >/= 101).    . benazepril (LOTENSIN) 20 MG tablet TAKE 1 TABLET DAILY 90 tablet 4  . diphenoxylate-atropine (LOMOTIL) 2.5-0.025 MG tablet Take 1 tablet by mouth 4 (four) times daily as needed for diarrhea or loose stools. Take it along with immodium 40 tablet 4  . DULoxetine (CYMBALTA) 20 MG capsule Take 2 capsules (40 mg total) by mouth daily. 180 capsule 0  . feeding supplement (BOOST HIGH PROTEIN) LIQD Take 1 Container by mouth daily.    . ferrous sulfate (FERROUSUL) 325 (65 FE) MG tablet Take 1 tablet (325 mg total) by mouth daily with breakfast. 90 tablet 3  . gabapentin (NEURONTIN) 300 MG capsule One pill in am; and 2 pills at night prior to sleep. 90 capsule 3  . hydrochlorothiazide (MICROZIDE) 12.5 MG capsule TAKE 1 CAPSULE DAILY 30 capsule 6  . HYDROcodone-acetaminophen (NORCO/VICODIN) 5-325 MG tablet Take 1 tablet by mouth every 12 (twelve) hours as needed for moderate pain. 60 tablet 0  . lidocaine (LIDODERM) 5 % Place 1 patch onto the skin daily. Remove & Discard patch within 12 hours or as directed by MD 30 patch 2  . lidocaine-prilocaine (EMLA) cream Apply 1 application topically as needed. Apply generously over the Mediport 45 minutes prior to chemotherapy. 30 g 6  . loperamide (IMODIUM) 2 MG capsule Take 2 mg by mouth as needed for diarrhea or loose stools.    . Multiple Vitamins-Minerals (MULTIVITAMIN ADULT PO) Take 1 tablet by mouth daily.    . ondansetron (ZOFRAN) 8 MG tablet One pill every 8 hours as  needed for nausea/vomitting. 60 tablet 0  . potassium chloride SA (K-DUR,KLOR-CON) 20 MEQ tablet 1 pill twice a day 60 tablet 3  . prochlorperazine (COMPAZINE) 10 MG tablet Take 1 tablet (10 mg total) by mouth every 6 (six) hours as needed for nausea or vomiting. 90 tablet 1   No current facility-administered medications for this visit.    Facility-Administered Medications Ordered in Other Visits  Medication Dose Route Frequency Provider Last Rate Last Dose  . fluorouracil (ADRUCIL) 3,500 mg in sodium chloride 0.9 % 80 mL chemo infusion  2,400 mg/m2 (Treatment Plan Recorded) Intravenous 1 day or 1 dose Charlaine Dalton R, MD   3,500 mg at 05/06/18 1510  . sodium chloride flush (NS) 0.9 % injection 10 mL  10 mL Intravenous PRN Cammie Sickle, MD   10 mL at 10/04/15 0901  . sodium chloride flush (NS) 0.9 % injection 10 mL  10 mL Intravenous PRN Cammie Sickle, MD   10 mL at 07/02/17 1020    PHYSICAL EXAMINATION: ECOG PERFORMANCE STATUS: 1 - Symptomatic but completely ambulatory  BP 101/68 (BP Location: Right Arm, Patient Position: Sitting)   Pulse 90   Resp 16   Wt 99 lb (44.9 kg)   LMP  (LMP Unknown) Comment: LMP MORE THAN 5 YRS  BMI 19.33 kg/m   Filed Weights   05/06/18 1348  Weight: 99 lb (44.9 kg)    Physical Exam  Constitutional: She is oriented to person, place, and time and well-developed, well-nourished, and in no distress.  She is accompanied by her mother.  She is walking herself.  HENT:  Head: Normocephalic and atraumatic.  Mouth/Throat: Oropharynx is clear and moist. No oropharyngeal exudate.  Eyes: Pupils are equal, round, and reactive to light.  Neck: Normal range of motion. Neck supple.  Cardiovascular: Normal rate and regular rhythm.  Pulmonary/Chest: No respiratory distress. She has no wheezes.  Abdominal: Soft. Bowel sounds are normal. She exhibits no distension and no mass. There is no abdominal tenderness. There is no rebound and no guarding.   Musculoskeletal: Normal range of motion.        General: No tenderness or edema.  Neurological: She is alert and oriented to person, place, and time.  Skin: Skin is warm.  Psychiatric: Affect normal.       LABORATORY DATA:  I have reviewed the data as listed    Component Value Date/Time   NA 138 05/06/2018 1253   K 3.3 (L) 05/06/2018 1253   CL 107 05/06/2018 1253   CO2 22 05/06/2018 1253   GLUCOSE 94 05/06/2018 1253   BUN 10 05/06/2018 1253   CREATININE 0.69 05/06/2018 1253   CALCIUM 8.8 (L) 05/06/2018 1253   PROT 6.8 05/06/2018 1253   ALBUMIN 3.6 05/06/2018 1253   AST 16 05/06/2018 1253   ALT 11 05/06/2018 1253   ALKPHOS 85 05/06/2018 1253   BILITOT 0.4 05/06/2018 1253   GFRNONAA >60 05/06/2018 1253   GFRAA >60 05/06/2018 1253    No results found for: SPEP, UPEP  Lab Results  Component Value Date   WBC 6.8 05/06/2018   NEUTROABS 4.6 05/06/2018   HGB 11.8 (L) 05/06/2018   HCT 37.2 05/06/2018   MCV 96.1 05/06/2018   PLT 197 05/06/2018      Chemistry      Component Value Date/Time   NA 138 05/06/2018 1253   K 3.3 (L) 05/06/2018 1253   CL 107 05/06/2018 1253   CO2 22 05/06/2018 1253   BUN 10 05/06/2018 1253   CREATININE 0.69 05/06/2018 1253      Component Value Date/Time   CALCIUM 8.8 (L) 05/06/2018 1253   ALKPHOS 85 05/06/2018 1253   AST 16 05/06/2018 1253   ALT 11 05/06/2018 1253   BILITOT 0.4 05/06/2018 1253       RADIOGRAPHIC STUDIES: I have personally reviewed the radiological images as listed and agreed with the findings in the report. No results found.   ASSESSMENT & PLAN:  Cancer of ascending colon metastatic to intra-abdominal lymph node ([place]) # Colon cancer- cecal/ right-sided; STAGE IV; OCT 3rd 2019- CT- slight STABLE liver lesion; but MRI NOV 2019- Stable/improved liver lesion; increasing L-2 lesion; DEC 2019- MRI increasing lesion in L-2 [see below].  CEA -12-15; stable.  Currently on 5-FU; avastin on HOLD.  # proceed with 5FU  infusion today; Labs today reviewed;  acceptable for treatment today; will try to  finish sooner given appt in [place].   # Worsening back pain- see above/ on hydrocodone 1-2 day.  Missy Sabins W. Awaiting IR evaluation on 12/18 for osteocool/ablation.   # Hypokalemia-3.3 potassium continue potassium 20 mg once a day.  Stable.   #Peripheral neuropathy grade 2-continue gabapentin plus Cymbalta-stable.   # DISPOSITION:  # Treatment today-  5FU;will need to move sooner [11am] on 12/18 sec to appt in [place].   # Follow up in - 3 weeks; MD/ labs- cbc/cmp/cea chemo-5FU pump; pump off in 2 days- Dr.B   No orders of the defined types were placed in this encounter.  All questions were answered. The patient knows to call the clinic with any problems, questions or concerns.      Cammie Sickle, MD 05/06/2018 7:11 PM

## 2018-05-07 LAB — CEA: CEA: 10.8 ng/mL — ABNORMAL HIGH (ref 0.0–4.7)

## 2018-05-08 ENCOUNTER — Ambulatory Visit
Admission: RE | Admit: 2018-05-08 | Discharge: 2018-05-08 | Disposition: A | Discharge status: Home / Self Care | Payer: BLUE CROSS/BLUE SHIELD | Source: Ambulatory Visit | Attending: Internal Medicine | Admitting: Internal Medicine

## 2018-05-08 ENCOUNTER — Inpatient Hospital Stay: Payer: BLUE CROSS/BLUE SHIELD

## 2018-05-08 DIAGNOSIS — S32000A Wedge compression fracture of unspecified lumbar vertebra, initial encounter for closed fracture: Secondary | ICD-10-CM

## 2018-05-08 DIAGNOSIS — C7951 Secondary malignant neoplasm of bone: Secondary | ICD-10-CM

## 2018-05-08 DIAGNOSIS — C182 Malignant neoplasm of ascending colon: Secondary | ICD-10-CM

## 2018-05-08 DIAGNOSIS — C180 Malignant neoplasm of cecum: Secondary | ICD-10-CM | POA: Diagnosis not present

## 2018-05-08 DIAGNOSIS — C772 Secondary and unspecified malignant neoplasm of intra-abdominal lymph nodes: Principal | ICD-10-CM

## 2018-05-08 HISTORY — PX: IR RADIOLOGIST EVAL & MGMT: IMG5224

## 2018-05-08 MED ORDER — HEPARIN SOD (PORK) LOCK FLUSH 100 UNIT/ML IV SOLN
500.0000 [IU] | Freq: Once | INTRAVENOUS | Status: AC | PRN
  Administered 2018-05-08: 500 [IU]
  Filled 2018-05-08: qty 5

## 2018-05-08 MED ORDER — SODIUM CHLORIDE 0.9% FLUSH
10.0000 mL | INTRAVENOUS | Status: DC | PRN
  Administered 2018-05-08: 10 mL
  Filled 2018-05-08: qty 10

## 2018-05-08 NOTE — Consult Note (Signed)
Chief Complaint: Symptomatic pathologic fracture of L2  Referring Physician(s): Brahmanday,Govinda R  History of Present Illness: Nancy Clark is a 57 y.o. female presenting today to [place] as a scheduled consultation, kindly referred by Dr. Rogue Bussing of Heme/Onc, for evaluation of symptomatic L2 pathologic fracture, and possible candidacy for treatment.   Nancy Clark is here today with her mother for the interview.  She is a young with unfortunate diagnosis of metastatic colon cancer.  Currently she is receiving 5FU.  She has a recent MRI showing progression of disease involving the L2 vertebral body.    She tells me that she has persisting, daily pain at the lower back, which is consistently in the range of 8-10/10 intensity, with achy, sharp quality, that is resistant to any treatment.  She currently takes vicodin for her pain, and she says it does not help very much with the pain.  She does have secondary symptoms of the pain medication of constipation.     She did have prior treatment of the lumbar spine in summer of 2018 with rad onc, last note was 11/20/2016.    Past Medical History:  Diagnosis Date  . Chicken pox   . Colon cancer ([place])    Partial colectomy 07/2015 + chemo tx's  . Hypertension   . Hypokalemia   . Menopause    > 5 yrs  . Peripheral neuropathy due to chemotherapy [facility])     Past Surgical History:  Procedure Laterality Date  . BREAST BIOPSY Right    2007? pt unsure done by [facility]  . COLONOSCOPY    . COLONOSCOPY WITH PROPOFOL N/A 10/14/2015   Procedure: COLONOSCOPY WITH PROPOFOL;  Surgeon: Hulen Luster, MD;  Location: [facility] ENDOSCOPY;  Service: Endoscopy;  Laterality: N/A;  . COLOSTOMY REVISION Right 08/09/2015   Procedure: COLON RESECTION RIGHT;  Surgeon: Florene Glen, MD;  Location: [facility] ORS;  Service: General;  Laterality: Right;  . ECTOPIC PREGNANCY SURGERY    . PORTACATH PLACEMENT N/A 09/01/2015   Procedure: INSERTION PORT-A-CATH;  Surgeon: Florene Glen, MD;  Location: [facility] ORS;  Service: General;  Laterality: N/A;  . SHOULDER SURGERY Left     Allergies: Patient has no known allergies.  Medications: Prior to Admission medications   Medication Sig Start Date End Date Taking? Authorizing Provider  acetaminophen (TYLENOL) 325 MG tablet Take 2 tablets (650 mg total) by mouth every 6 (six) hours as needed for mild pain (or Fever >/= 101). 10/14/15   Nicholes Mango, MD  benazepril (LOTENSIN) 20 MG tablet TAKE 1 TABLET DAILY 04/05/18   Cammie Sickle, MD  diphenoxylate-atropine (LOMOTIL) 2.5-0.025 MG tablet Take 1 tablet by mouth 4 (four) times daily as needed for diarrhea or loose stools. Take it along with immodium 12/17/17   Cammie Sickle, MD  DULoxetine (CYMBALTA) 20 MG capsule Take 2 capsules (40 mg total) by mouth daily. 02/11/18   Cammie Sickle, MD  feeding supplement (BOOST HIGH PROTEIN) LIQD Take 1 Container by mouth daily.    [provider]  ferrous sulfate (FERROUSUL) 325 (65 FE) MG tablet Take 1 tablet (325 mg total) by mouth daily with breakfast. 10/14/15   Gouru, Illene Silver, MD  gabapentin (NEURONTIN) 300 MG capsule One pill in am; and 2 pills at night prior to sleep. 02/11/18   Cammie Sickle, MD  hydrochlorothiazide (MICROZIDE) 12.5 MG capsule TAKE 1 CAPSULE DAILY 12/10/17   Jacquelin Hawking, NP  HYDROcodone-acetaminophen (NORCO/VICODIN) 5-325 MG tablet Take 1 tablet  by mouth every 12 (twelve) hours as needed for moderate pain. 12/31/17   Cammie Sickle, MD  lidocaine (LIDODERM) 5 % Place 1 patch onto the skin daily. Remove & Discard patch within 12 hours or as directed by MD 02/11/18   Cammie Sickle, MD  lidocaine-prilocaine (EMLA) cream Apply 1 application topically as needed. Apply generously over the Mediport 45 minutes prior to chemotherapy. 10/02/16   Cammie Sickle, MD  loperamide (IMODIUM) 2 MG capsule Take 2 mg by mouth as needed for diarrhea or loose stools.    [provider]  Multiple Vitamins-Minerals (MULTIVITAMIN ADULT PO) Take 1 tablet by mouth daily.    [provider]  ondansetron (ZOFRAN) 8 MG tablet One pill every 8 hours as needed for nausea/vomitting. 03/25/18   Cammie Sickle, MD  potassium chloride SA (K-DUR,KLOR-CON) 20 MEQ tablet 1 pill twice a day 12/17/17   Cammie Sickle, MD  prochlorperazine (COMPAZINE) 10 MG tablet Take 1 tablet (10 mg total) by mouth every 6 (six) hours as needed for nausea or vomiting. 12/17/17   Cammie Sickle, MD     Family History  Problem Relation Age of Onset  . Hypertension Mother   . Arthritis Father   . Prostate cancer Father   . Diabetes Maternal Grandmother   . Colon cancer Maternal Grandfather        colon    Social History   Socioeconomic History  . Marital status: Married    Spouse name: Not on file  . Number of children: Not on file  . Years of education: Not on file  . Highest education level: Not on file  Occupational History  . Not on file  Social Needs  . Financial resource strain: Not on file  . Food insecurity:    Worry: Not on file    Inability: Not on file  . Transportation needs:    Medical: Not on file    Non-medical: Not on file  Tobacco Use  . Smoking status: Former Smoker    Packs/day: 0.25    Years: 2.00    Pack years: 0.50  . Smokeless tobacco: Never Used  . Tobacco comment: recently quit  Substance and Sexual Activity  . Alcohol use: No    Alcohol/week: 0.0 standard drinks  . Drug use: No  . Sexual activity: Yes  Lifestyle  . Physical activity:    Days per week: Not on file    Minutes per session: Not on file  . Stress: Not on file  Relationships  . Social connections:    Talks on phone: Not on file    Gets together: Not on file    Attends religious service: Not on file    Active member of club or organization: Not on file    Attends meetings of clubs or organizations: Not on file    Relationship status: Not on file    Other Topics Concern  . Not on file  Social History Narrative  . Not on file    ECOG Status: 2 - Symptomatic, <50% confined to bed  Review of Systems: A 12 point ROS discussed and pertinent positives are indicated in the HPI above.  All other systems are negative.  Review of Systems  Vital Signs: BP 133/74   Pulse 81   Temp 97.9 F (36.6 C) (Oral)   Resp 15   Ht 5' (1.524 m)   Wt 44.9 kg   LMP  (LMP Unknown) Comment: LMP MORE  THAN 5 YRS  SpO2 100%   BMI 19.33 kg/m   Physical Exam General: 57 yo female appearing   stated age.  Well-developed, well-nourished.  No distress. HEENT: Atraumatic, normocephalic.  Conjugate gaze, extra-ocular motor intact. No scleral icterus or scleral injection. No lesions on external ears, nose, lips, or gums.  Oral mucosa moist, pink.  Neck: Symmetric with no goiter enlargement.  Chest/Lungs:  Symmetric chest with inspiration/expiration.  No labored breathing.  Clear to auscultation with no wheezes, rhonchi, or rales.  Heart:  RRR, with no third heart sounds appreciated. No JVD appreciated.  Abdomen:  Soft, NT/ND, with + bowel sounds.   Genito-urinary: Deferred Neurologic: Alert & Oriented to person, place, and time.   Normal affect and insight.  Appropriate questions.  Moving all 4 extremities with gross sensory intact.   MSK: Reproducible pain along the midline spine corresponding to the L2 level. .  Mallampati Score:     Imaging: Mr Lumbar Spine W Wo Contrast  Result Date: 05/04/2018 CLINICAL DATA:  57 year old female with metastatic colon cancer. Lumbar metastasis status post radiation in 2018. Low back pain radiating to the left thigh, leg. EXAM: MRI LUMBAR SPINE WITHOUT AND WITH CONTRAST TECHNIQUE: Multiplanar and multiecho pulse sequences of the lumbar spine were obtained without and with intravenous contrast. CONTRAST:  4.5 milliliters Gadavist COMPARISON:  Abdomen MRI 03/28/2018. CT Abdomen and Pelvis 02/22/2018, thoracic and lumbar  MRI 06/08/2017. FINDINGS: Segmentation: Normal, the same numbering system as on the January MRI. Alignment: Stable vertebral height and alignment. Preserved lordosis. Subtle anterolisthesis at L5-S1. Vertebrae: Progressed abnormal decreased T1 signal throughout the L2 vertebral body since January, involvement of the left pedicle. In the left half of the vertebral body there is a rounded masslike area of decreased T1 and T2 signal encompassing 22 millimeters (series 10, image 16). There is patchy increased STIR signal, heterogeneous abnormal enhancement throughout the vertebral body and the left pedicle. No extraosseous, epidural involvement. Stable marrow signal elsewhere in the visible lower thoracic, lumbar spine, and visible sacrum. Increased T1 signal in the L1 and L3 bodies compatible with prior radiation. No other abnormal STIR signal or osseous enhancement. Conus medullaris and cauda equina: Conus extends to the T12-L1 level. No lower spinal cord or conus signal abnormality. No abnormal intradural enhancement. No dural thickening. Paraspinal and other soft tissues: Stable, negative visible abdominal viscera. Negative visualized posterior paraspinal soft tissues. Disc levels: No degenerative lower thoracic or lumbar spinal stenosis. There is moderate lower lumbar facet arthropathy, bilaterally L3-L4 through L5-S1. Up to severe at the latter. No lateral recess stenosis.  Only mild neural foraminal stenosis. IMPRESSION: 1. Progressed abnormal signal and enhancement throughout the L2 vertebral body and left pedicle since January most compatible with progressed osseous metastatic disease. No pathologic fracture. No epidural or extraosseous tumor. 2. No other metastatic disease identified in the lower thoracic, lumbar, or upper sacral spine. 3. Degenerative lower lumbar facet arthropathy. Electronically Signed   By: Genevie Ann M.D.   On: 05/04/2018 14:04    Labs:  CBC: Recent Labs    03/25/18 0825  04/08/18 0830 04/22/18 0920 05/06/18 1253  WBC 7.1 7.5 9.3 6.8  HGB 11.4* 11.7* 11.0* 11.8*  HCT 34.9* 35.9* 34.9* 37.2  PLT 148* 144* 143* 197    COAGS: No results for input(s): INR, APTT in the last 8760 hours.  BMP: Recent Labs    03/25/18 0825 04/08/18 0830 04/22/18 0920 05/06/18 1253  NA 137 137 137 138  K 3.6 3.2* 3.3*  3.3*  CL 106 105 103 107  CO2 24 24 25 22   GLUCOSE 101* 103* 107* 94  BUN 14 9 13 10   CALCIUM 8.8* 9.1 8.7* 8.8*  CREATININE 0.88 0.83 0.88 0.69  GFRNONAA >60 >60 >60 >60  GFRAA >60 >60 >60 >60    LIVER FUNCTION TESTS: Recent Labs    03/25/18 0825 04/08/18 0830 04/22/18 0920 05/06/18 1253  BILITOT 0.5 0.4 0.3 0.4  AST 15 16 16 16   ALT 10 11 9 11   ALKPHOS 81 81 81 85  PROT 6.5 6.6 6.4* 6.8  ALBUMIN 3.6 3.5 3.3* 3.6    TUMOR MARKERS: No results for input(s): AFPTM, CEA, CA199, CHROMGRNA in the last 8760 hours.  Assessment and Plan:  Nancy Clark is a 57 year old female with known metastatic colon carcinoma, with symptomatic pathologic fracture/involvment of the L2 level.   I had a lengthy discussion with her regarding treatment options, including percutaneous RF ablation with same-session vertebral augmentation.    I do think that she is a good candidate for treatment.    Specific risks discussed include bleeding, infection, nerve injury, embolization, need for further procedure/surgery, ongoing symptoms, cardiovascular collapse, death.   After our discussion, she would like to proceeds.  Goals of therapy are to decrease her pain on the absolute pain scale, to secondarily decease her need for strong pain medications, and to increase comfort with daily activity.   Plan: - Plan to proceed with L2 Osteocool and vertebral augmentation/KP.  She does prefer to be treated ASAP.  I offered her the option of being treated with one of my partners for a better chance at treatment before January 1st, but she also prefers that I treat her being that I  did her consult.  Perhaps we can find an upcoming date with Dr. Earleen Newport.    - I have advised her to observe her other doctors appointments.   Thank you for this interesting consult.  I greatly enjoyed meeting FORTUNE TOROSIAN and look forward to participating in their care.  A copy of this report was sent to the requesting provider on this date.  Electronically Signed: Corrie Mckusick 05/08/2018, 2:08 PM   I spent a total of  40 Minutes   in face to face in clinical consultation, greater than 50% of which was counseling/coordinating care for symptomatic L2 pathologic fracture, possible Osteocool and vertebral augmentation.

## 2018-05-09 ENCOUNTER — Telehealth: Payer: Self-pay | Admitting: *Deleted

## 2018-05-09 NOTE — Telephone Encounter (Signed)
Patient saw Dr. Jacqualyn Posey on 05/08/18 - and IR procedure with Dr. Jacqualyn Posey was discussed,  Renita Papa, RN, has contacted patient to give her Dr. Leigh Aurora office number [phone] to discuss how soon IR procedure can be done.

## 2018-05-09 NOTE — Telephone Encounter (Signed)
Patient called and states she has been talking to radiologist and would like a referral to Dr Earleen Newport before the first of the year.

## 2018-05-21 ENCOUNTER — Other Ambulatory Visit (HOSPITAL_COMMUNITY): Payer: Self-pay | Admitting: Interventional Radiology

## 2018-05-21 DIAGNOSIS — C7951 Secondary malignant neoplasm of bone: Secondary | ICD-10-CM

## 2018-05-23 ENCOUNTER — Other Ambulatory Visit: Payer: Self-pay | Admitting: Radiology

## 2018-05-23 ENCOUNTER — Other Ambulatory Visit: Payer: Self-pay | Admitting: Physician Assistant

## 2018-05-24 ENCOUNTER — Other Ambulatory Visit (HOSPITAL_COMMUNITY): Payer: Self-pay | Admitting: Interventional Radiology

## 2018-05-24 ENCOUNTER — Encounter (HOSPITAL_COMMUNITY): Payer: Self-pay | Admitting: Interventional Radiology

## 2018-05-24 ENCOUNTER — Ambulatory Visit (HOSPITAL_COMMUNITY)
Admission: RE | Admit: 2018-05-24 | Discharge: 2018-05-24 | Disposition: A | Discharge status: Home / Self Care | Payer: BLUE CROSS/BLUE SHIELD | Source: Ambulatory Visit | Attending: Interventional Radiology | Admitting: Interventional Radiology

## 2018-05-24 ENCOUNTER — Other Ambulatory Visit: Payer: Self-pay

## 2018-05-24 DIAGNOSIS — Z8249 Family history of ischemic heart disease and other diseases of the circulatory system: Secondary | ICD-10-CM | POA: Diagnosis not present

## 2018-05-24 DIAGNOSIS — C7951 Secondary malignant neoplasm of bone: Secondary | ICD-10-CM | POA: Diagnosis not present

## 2018-05-24 DIAGNOSIS — Z85038 Personal history of other malignant neoplasm of large intestine: Secondary | ICD-10-CM | POA: Insufficient documentation

## 2018-05-24 DIAGNOSIS — G629 Polyneuropathy, unspecified: Secondary | ICD-10-CM | POA: Insufficient documentation

## 2018-05-24 DIAGNOSIS — Z9049 Acquired absence of other specified parts of digestive tract: Secondary | ICD-10-CM | POA: Insufficient documentation

## 2018-05-24 DIAGNOSIS — Z79899 Other long term (current) drug therapy: Secondary | ICD-10-CM | POA: Diagnosis not present

## 2018-05-24 DIAGNOSIS — Z800 Family history of malignant neoplasm of digestive organs: Secondary | ICD-10-CM | POA: Insufficient documentation

## 2018-05-24 DIAGNOSIS — I10 Essential (primary) hypertension: Secondary | ICD-10-CM | POA: Diagnosis not present

## 2018-05-24 DIAGNOSIS — Z87891 Personal history of nicotine dependence: Secondary | ICD-10-CM | POA: Insufficient documentation

## 2018-05-24 DIAGNOSIS — Z8042 Family history of malignant neoplasm of prostate: Secondary | ICD-10-CM | POA: Diagnosis not present

## 2018-05-24 HISTORY — PX: IR BONE TUMOR(S)RF ABLATION: IMG2284

## 2018-05-24 HISTORY — PX: IR KYPHO LUMBAR INC FX REDUCE BONE BX UNI/BIL CANNULATION INC/IMAGING: IMG5519

## 2018-05-24 LAB — CBC
HCT: 38.6 % (ref 36.0–46.0)
Hemoglobin: 12 g/dL (ref 12.0–15.0)
MCH: 30.7 pg (ref 26.0–34.0)
MCHC: 31.1 g/dL (ref 30.0–36.0)
MCV: 98.7 fL (ref 80.0–100.0)
Platelets: 162 10*3/uL (ref 150–400)
RBC: 3.91 MIL/uL (ref 3.87–5.11)
RDW: 17.2 % — ABNORMAL HIGH (ref 11.5–15.5)
WBC: 5.8 10*3/uL (ref 4.0–10.5)
nRBC: 0 % (ref 0.0–0.2)

## 2018-05-24 LAB — BASIC METABOLIC PANEL
Anion gap: 7 (ref 5–15)
BUN: 7 mg/dL (ref 6–20)
CHLORIDE: 110 mmol/L (ref 98–111)
CO2: 22 mmol/L (ref 22–32)
Calcium: 8.9 mg/dL (ref 8.9–10.3)
Creatinine, Ser: 0.88 mg/dL (ref 0.44–1.00)
GFR calc Af Amer: >60 mL/min (ref >60)
GFR calc non Af Amer: >60 mL/min (ref >60)
Glucose, Bld: 91 mg/dL (ref 70–99)
Potassium: 3.8 mmol/L (ref 3.5–5.1)
SODIUM: 139 mmol/L (ref 135–145)

## 2018-05-24 LAB — PROTIME-INR
INR: 1.11
Prothrombin Time: 14.2 seconds (ref 11.4–15.2)

## 2018-05-24 MED ORDER — CEFAZOLIN SODIUM-DEXTROSE 2-4 GM/100ML-% IV SOLN
2.0000 g | INTRAVENOUS | Status: AC
  Administered 2018-05-24: 2 g via INTRAVENOUS

## 2018-05-24 MED ORDER — FENTANYL CITRATE (PF) 100 MCG/2ML IJ SOLN
INTRAMUSCULAR | Status: AC | PRN
  Administered 2018-05-24: 12.5 ug via INTRAVENOUS
  Administered 2018-05-24: 50 ug via INTRAVENOUS
  Administered 2018-05-24 (×3): 12.5 ug via INTRAVENOUS

## 2018-05-24 MED ORDER — HYDROCODONE-ACETAMINOPHEN 5-325 MG PO TABS
1.0000 | ORAL_TABLET | Freq: Once | ORAL | Status: AC
  Administered 2018-05-24: 1 via ORAL
  Filled 2018-05-24: qty 1

## 2018-05-24 MED ORDER — MIDAZOLAM HCL 2 MG/2ML IJ SOLN
INTRAMUSCULAR | Status: AC | PRN
  Administered 2018-05-24 (×2): 0.5 mg via INTRAVENOUS
  Administered 2018-05-24: 1 mg via INTRAVENOUS

## 2018-05-24 MED ORDER — FENTANYL CITRATE (PF) 100 MCG/2ML IJ SOLN
INTRAMUSCULAR | Status: AC
  Filled 2018-05-24: qty 4

## 2018-05-24 MED ORDER — LIDOCAINE HCL 1 % IJ SOLN
INTRAMUSCULAR | Status: AC
  Filled 2018-05-24: qty 20

## 2018-05-24 MED ORDER — CEFAZOLIN SODIUM-DEXTROSE 2-4 GM/100ML-% IV SOLN
INTRAVENOUS | Status: AC
  Filled 2018-05-24: qty 100

## 2018-05-24 MED ORDER — HYDROMORPHONE HCL 1 MG/ML IJ SOLN
INTRAMUSCULAR | Status: AC
  Filled 2018-05-24: qty 1

## 2018-05-24 MED ORDER — LIDOCAINE HCL (PF) 1 % IJ SOLN
INTRAMUSCULAR | Status: AC | PRN
  Administered 2018-05-24: 20 mL

## 2018-05-24 MED ORDER — SODIUM CHLORIDE 0.9 % IV SOLN
INTRAVENOUS | Status: DC
  Administered 2018-05-24: 11:00:00 via INTRAVENOUS

## 2018-05-24 MED ORDER — MIDAZOLAM HCL 2 MG/2ML IJ SOLN
INTRAMUSCULAR | Status: AC
  Filled 2018-05-24: qty 4

## 2018-05-24 MED ORDER — IOPAMIDOL (ISOVUE-300) INJECTION 61%
INTRAVENOUS | Status: AC
  Filled 2018-05-24: qty 50

## 2018-05-24 NOTE — Discharge Instructions (Signed)
Moderate Conscious Sedation, Adult, Care After  These instructions provide you with information about caring for yourself after your procedure. Your health care provider may also give you more specific instructions. Your treatment has been planned according to current medical practices, but problems sometimes occur. Call your health care provider if you have any problems or questions after your procedure.  What can I expect after the procedure?  After your procedure, it is common:   To feel sleepy for several hours.   To feel clumsy and have poor balance for several hours.   To have poor judgment for several hours.   To vomit if you eat too soon.  Follow these instructions at home:  For at least 24 hours after the procedure:     Do not:  ? Participate in activities where you could fall or become injured.  ? Drive.  ? Use heavy machinery.  ? Drink alcohol.  ? Take sleeping pills or medicines that cause drowsiness.  ? Make important decisions or sign legal documents.  ? Take care of children on your own.   Rest.  Eating and drinking   Follow the diet recommended by your health care provider.   If you vomit:  ? Drink water, juice, or soup when you can drink without vomiting.  ? Make sure you have little or no nausea before eating solid foods.  General instructions   Have a responsible adult stay with you until you are awake and alert.   Take over-the-counter and prescription medicines only as told by your health care provider.   If you smoke, do not smoke without supervision.   Keep all follow-up visits as told by your health care provider. This is important.  Contact a health care provider if:   You keep feeling nauseous or you keep vomiting.   You feel light-headed.   You develop a rash.   You have a fever.  Get help right away if:   You have trouble breathing.  This information is not intended to replace advice given to you by your health care provider. Make sure you discuss any questions you have  with your health care provider.  Document Released: 02/26/2013 Document Revised: 10/11/2015 Document Reviewed: 08/28/2015  Elsevier Interactive Patient Education  2019 [employer]  Balloon Kyphoplasty, Care After  Refer to this sheet in the next few weeks. These instructions provide you with information about caring for yourself after your procedure. Your health care provider may also give you more specific instructions. Your treatment has been planned according to current medical practices, but problems sometimes occur. Call your health care provider if you have any problems or questions after your procedure.  What can I expect after the procedure?  After your procedure, it is common to have back pain.  Follow these instructions at home:  Incision care   Follow instructions from your health care provider about how to take care of your incisions. Make sure you:  ? Wash your hands with soap and water before you change your bandage (dressing). If soap and water are not available, use hand sanitizer.  ? Change your dressing as told by your health care provider.  ? Leave stitches (sutures), skin glue, or adhesive strips in place. These skin closures may need to be in place for 2 weeks or longer. If adhesive strip edges start to loosen and curl up, you may trim the loose edges. Do not remove adhesive strips completely unless your health care provider tells you to   do that.   Check your incision area every day for signs of infection. Watch for:  ? Redness, swelling, or pain.  ? Fluid, blood, or pus.   Keep your dressing dry until your health care provider says that it can be removed.  Activity     Rest your back and avoid intense physical activity for as long as told by your health care provider.   Return to your normal activities as told by your health care provider. Ask your health care provider what activities are safe for you.   Do not lift anything that is heavier than 10 lb (4.5 kg). This is about the weight  of a gallon of milk.You may need to avoid heavy lifting for several weeks.  General instructions   Take over-the-counter and prescription medicines only as told by your health care provider.   If directed, apply ice to the painful area:  ? Put ice in a plastic bag.  ? Place a towel between your skin and the bag.  ? Leave the ice on for 20 minutes, 2-3 times per day.   Do not use tobacco products, including cigarettes, chewing tobacco, or e-cigarettes. If you need help quitting, ask your health care provider.   Keep all follow-up visits as told by your health care provider. This is important.  Contact a health care provider if:   You have a fever.   You have redness, swelling, or pain at the site of your incisions.   You have fluid, blood, or pus coming from your incisions.   You have pain that gets worse or does not get better with medicine.   You develop numbness or weakness in any part of your body.  Get help right away if:   You have chest pain.   You have difficulty breathing.   You cannot move your legs.   You cannot control your bladder or bowel movements.   You suddenly become weak or numb on one side of your body.   You become very confused.   You have trouble speaking or understanding, or both.  This information is not intended to replace advice given to you by your health care provider. Make sure you discuss any questions you have with your health care provider.  Document Released: 01/27/2015 Document Revised: 10/14/2015 Document Reviewed: 08/31/2014  Elsevier Interactive Patient Education  2019 [employer]

## 2018-05-24 NOTE — Procedures (Signed)
Interventional Radiology Procedure Note  Procedure:  Image guided L2 RFA ablation/osteocool, with kyphoplasty, bipedicular Complications: None Recommendations:  - 3 hours supine - advance diet - Do not submerge for 7 days - Routine wound care - dc in 3 hours when goals met   Signed,  Dulcy Fanny. Earleen Newport, DO

## 2018-05-24 NOTE — H&P (Signed)
Chief Complaint: Patient was seen in consultation today for L2 pathologic fracture.  Referring Physician(s): Cammie Sickle  Supervising Physician: Corrie Mckusick  Patient Status: [facility] - Out-pt  History of Present Illness: Nancy Clark is a 58 y.o. female with a past medical history of hypertension, metastatic colon cancer, hypokalemia, and peripheral neuropathy. She is known to IR and has been followed by Dr. Earleen Newport since 04/2018. She first presented to our department as a referral by Dr. Rogue Bussing for management of symptomatic L2 pathologic fracture. She consulted with Dr. Earleen Newport 05/08/2018 to discuss management options. At that time, patient decided to pursue osteocool ablation with vertebral augmentation.  MR lumbar spine 05/04/2018: 1. Progressed abnormal signal and enhancement throughout the L2 vertebral body and left pedicle since January most compatible with progressed osseous metastatic disease. No pathologic fracture. No epidural or extraosseous tumor. 2. No other metastatic disease identified in the lower thoracic, lumbar, or upper sacral spine. 3. Degenerative lower lumbar facet arthropathy.  Patient presents today for possible image-guided L2 osteocool ablation with possible vertebral augmentation. Patient awake and alert laying in bed. Accompanied by husband and mother at bedside. Complains of low back pain, rated 6/10 at this time. Denies fever, chills, chest pain, dyspnea, abdominal pain, bowel/bladder incontinence, dizziness, or pain/numbness/tingling down legs.   Past Medical History:  Diagnosis Date  . Chicken pox   . Colon cancer ([place])    Partial colectomy 07/2015 + chemo tx's  . Hypertension   . Hypokalemia   . Menopause    > 5 yrs  . Peripheral neuropathy due to chemotherapy [facility])     Past Surgical History:  Procedure Laterality Date  . BREAST BIOPSY Right    2007? pt unsure done by [facility]  . COLONOSCOPY    . COLONOSCOPY WITH PROPOFOL N/A  10/14/2015   Procedure: COLONOSCOPY WITH PROPOFOL;  Surgeon: Hulen Luster, MD;  Location[facility] ENDOSCOPY;  Service: Endoscopy;  Laterality: N/A;  . COLOSTOMY REVISION Right 08/09/2015   Procedure: COLON RESECTION RIGHT;  Surgeon: Florene Glen, MD;  Location: [facility] ORS;  Service: General;  Laterality: Right;  . ECTOPIC PREGNANCY SURGERY    . IR RADIOLOGIST EVAL & MGMT  05/08/2018  . PORTACATH PLACEMENT N/A 09/01/2015   Procedure: INSERTION PORT-A-CATH;  Surgeon: Florene Glen, MD;  Location: [facility] ORS;  Service: General;  Laterality: N/A;  . SHOULDER SURGERY Left     Allergies: Patient has no known allergies.  Medications: Prior to Admission medications   Medication Sig Start Date End Date Taking? Authorizing Provider  acetaminophen (TYLENOL) 325 MG tablet Take 2 tablets (650 mg total) by mouth every 6 (six) hours as needed for mild pain (or Fever >/= 101). 10/14/15   Nicholes Mango, MD  benazepril (LOTENSIN) 20 MG tablet TAKE 1 TABLET DAILY 04/05/18   Cammie Sickle, MD  diphenoxylate-atropine (LOMOTIL) 2.5-0.025 MG tablet Take 1 tablet by mouth 4 (four) times daily as needed for diarrhea or loose stools. Take it along with immodium 12/17/17   Cammie Sickle, MD  DULoxetine (CYMBALTA) 20 MG capsule Take 2 capsules (40 mg total) by mouth daily. 02/11/18   Cammie Sickle, MD  feeding supplement (BOOST HIGH PROTEIN) LIQD Take 1 Container by mouth daily.    [provider]  ferrous sulfate (FERROUSUL) 325 (65 FE) MG tablet Take 1 tablet (325 mg total) by mouth daily with breakfast. 10/14/15   Gouru, Aruna, MD  gabapentin (NEURONTIN) 300 MG capsule One pill in am; and 2 pills  at night prior to sleep. 02/11/18   Cammie Sickle, MD  hydrochlorothiazide (MICROZIDE) 12.5 MG capsule TAKE 1 CAPSULE DAILY 12/10/17   Jacquelin Hawking, NP  HYDROcodone-acetaminophen (NORCO/VICODIN) 5-325 MG tablet Take 1 tablet by mouth every 12 (twelve) hours as needed for moderate pain.  12/31/17   Cammie Sickle, MD  lidocaine (LIDODERM) 5 % Place 1 patch onto the skin daily. Remove & Discard patch within 12 hours or as directed by MD 02/11/18   Cammie Sickle, MD  lidocaine-prilocaine (EMLA) cream Apply 1 application topically as needed. Apply generously over the Mediport 45 minutes prior to chemotherapy. 10/02/16   Cammie Sickle, MD  loperamide (IMODIUM) 2 MG capsule Take 2 mg by mouth as needed for diarrhea or loose stools.    [provider]  Multiple Vitamins-Minerals (MULTIVITAMIN ADULT PO) Take 1 tablet by mouth daily.    [provider]  ondansetron (ZOFRAN) 8 MG tablet One pill every 8 hours as needed for nausea/vomitting. 03/25/18   Cammie Sickle, MD  potassium chloride SA (K-DUR,KLOR-CON) 20 MEQ tablet 1 pill twice a day 12/17/17   Cammie Sickle, MD  prochlorperazine (COMPAZINE) 10 MG tablet Take 1 tablet (10 mg total) by mouth every 6 (six) hours as needed for nausea or vomiting. 12/17/17   Cammie Sickle, MD     Family History  Problem Relation Age of Onset  . Hypertension Mother   . Arthritis Father   . Prostate cancer Father   . Diabetes Maternal Grandmother   . Colon cancer Maternal Grandfather        colon    Social History   Socioeconomic History  . Marital status: Married    Spouse name: Not on file  . Number of children: Not on file  . Years of education: Not on file  . Highest education level: Not on file  Occupational History  . Not on file  Social Needs  . Financial resource strain: Not on file  . Food insecurity:    Worry: Not on file    Inability: Not on file  . Transportation needs:    Medical: Not on file    Non-medical: Not on file  Tobacco Use  . Smoking status: Former Smoker    Packs/day: 0.25    Years: 2.00    Pack years: 0.50  . Smokeless tobacco: Never Used  . Tobacco comment: recently quit  Substance and Sexual Activity  . Alcohol use: No    Alcohol/week: 0.0  standard drinks  . Drug use: No  . Sexual activity: Yes  Lifestyle  . Physical activity:    Days per week: Not on file    Minutes per session: Not on file  . Stress: Not on file  Relationships  . Social connections:    Talks on phone: Not on file    Gets together: Not on file    Attends religious service: Not on file    Active member of club or organization: Not on file    Attends meetings of clubs or organizations: Not on file    Relationship status: Not on file  Other Topics Concern  . Not on file  Social History Narrative  . Not on file     Review of Systems: A 12 point ROS discussed and pertinent positives are indicated in the HPI above.  All other systems are negative.  Review of Systems  Constitutional: Negative for chills and fever.  Respiratory: Negative for shortness of  breath and wheezing.   Cardiovascular: Negative for chest pain and palpitations.  Gastrointestinal: Negative for abdominal pain.       Negative for bowel incontinence.  Genitourinary:       Negative for bladder incontinence.  Musculoskeletal: Positive for back pain.  Neurological: Negative for dizziness and numbness.  Psychiatric/Behavioral: Negative for behavioral problems and confusion.    Vital Signs: BP 125/75   Pulse 85   Temp 98.1 F (36.7 C)   Resp 18   Ht 5' (1.524 m)   Wt 101 lb (45.8 kg)   LMP  (LMP Unknown) Comment: LMP MORE THAN 5 YRS  SpO2 100%   BMI 19.73 kg/m   Physical Exam Vitals signs and nursing note reviewed.  Constitutional:      General: She is not in acute distress.    Appearance: Normal appearance.  Cardiovascular:     Rate and Rhythm: Normal rate and regular rhythm.     Heart sounds: Normal heart sounds. No murmur.  Pulmonary:     Effort: Pulmonary effort is normal. No respiratory distress.     Breath sounds: Normal breath sounds. No wheezing.  Musculoskeletal:     Comments: Mild-moderate tenderness of midline spine at approximate level of L2.  Skin:     General: Skin is warm and dry.  Neurological:     Mental Status: She is alert and oriented to person, place, and time.  Psychiatric:        Mood and Affect: Mood normal.        Behavior: Behavior normal.        Thought Content: Thought content normal.        Judgment: Judgment normal.      MD Evaluation Airway: WNL Heart: WNL Abdomen: WNL Chest/ Lungs: WNL ASA  Classification: 3 Mallampati/Airway Score: One   Imaging: Mr Lumbar Spine W Wo Contrast  Result Date: 05/04/2018 CLINICAL DATA:  58 year old female with metastatic colon cancer. Lumbar metastasis status post radiation in 2018. Low back pain radiating to the left thigh, leg. EXAM: MRI LUMBAR SPINE WITHOUT AND WITH CONTRAST TECHNIQUE: Multiplanar and multiecho pulse sequences of the lumbar spine were obtained without and with intravenous contrast. CONTRAST:  4.5 milliliters Gadavist COMPARISON:  Abdomen MRI 03/28/2018. CT Abdomen and Pelvis 02/22/2018, thoracic and lumbar MRI 06/08/2017. FINDINGS: Segmentation: Normal, the same numbering system as on the January MRI. Alignment: Stable vertebral height and alignment. Preserved lordosis. Subtle anterolisthesis at L5-S1. Vertebrae: Progressed abnormal decreased T1 signal throughout the L2 vertebral body since January, involvement of the left pedicle. In the left half of the vertebral body there is a rounded masslike area of decreased T1 and T2 signal encompassing 22 millimeters (series 10, image 16). There is patchy increased STIR signal, heterogeneous abnormal enhancement throughout the vertebral body and the left pedicle. No extraosseous, epidural involvement. Stable marrow signal elsewhere in the visible lower thoracic, lumbar spine, and visible sacrum. Increased T1 signal in the L1 and L3 bodies compatible with prior radiation. No other abnormal STIR signal or osseous enhancement. Conus medullaris and cauda equina: Conus extends to the T12-L1 level. No lower spinal cord or conus signal  abnormality. No abnormal intradural enhancement. No dural thickening. Paraspinal and other soft tissues: Stable, negative visible abdominal viscera. Negative visualized posterior paraspinal soft tissues. Disc levels: No degenerative lower thoracic or lumbar spinal stenosis. There is moderate lower lumbar facet arthropathy, bilaterally L3-L4 through L5-S1. Up to severe at the latter. No lateral recess stenosis.  Only mild neural foraminal stenosis.  IMPRESSION: 1. Progressed abnormal signal and enhancement throughout the L2 vertebral body and left pedicle since January most compatible with progressed osseous metastatic disease. No pathologic fracture. No epidural or extraosseous tumor. 2. No other metastatic disease identified in the lower thoracic, lumbar, or upper sacral spine. 3. Degenerative lower lumbar facet arthropathy. Electronically Signed   By: Genevie Ann M.D.   On: 05/04/2018 14:04   Ir Radiologist Eval & Mgmt  Result Date: 05/08/2018 Please refer to notes tab for details about interventional procedure. (Op Note)   Labs:  CBC: Recent Labs    03/25/18 0825 04/08/18 0830 04/22/18 0920 05/06/18 1253  WBC 7.1 7.5 9.3 6.8  HGB 11.4* 11.7* 11.0* 11.8*  HCT 34.9* 35.9* 34.9* 37.2  PLT 148* 144* 143* 197    COAGS: No results for input(s): INR, APTT in the last 8760 hours.  BMP: Recent Labs    03/25/18 0825 04/08/18 0830 04/22/18 0920 05/06/18 1253  NA 137 137 137 138  K 3.6 3.2* 3.3* 3.3*  CL 106 105 103 107  CO2 24 24 25 22   GLUCOSE 101* 103* 107* 94  BUN 14 9 13 10   CALCIUM 8.8* 9.1 8.7* 8.8*  CREATININE 0.88 0.83 0.88 0.69  GFRNONAA >60 >60 >60 >60  GFRAA >60 >60 >60 >60    LIVER FUNCTION TESTS: Recent Labs    03/25/18 0825 04/08/18 0830 04/22/18 0920 05/06/18 1253  BILITOT 0.5 0.4 0.3 0.4  AST 15 16 16 16   ALT 10 11 9 11   ALKPHOS 81 81 81 85  PROT 6.5 6.6 6.4* 6.8  ALBUMIN 3.6 3.5 3.3* 3.6    TUMOR MARKERS: No results for input(s): AFPTM, CEA, CA199,  CHROMGRNA in the last 8760 hours.  Assessment and Plan:  L2 pathologic fracture. Plan for image-guided L2 osteocool ablation with possible vertebral augmentation/KP today with Dr. Earleen Newport. Patient is NPO. Afebrile and WBCs WNL. She does not take blood thinners. INR pending.  Risks and benefits of L2 kyphoplasty/vertebroplasty were discussed with the patient including, but not limited to education regarding the natural healing process of compression fractures without intervention, bleeding, infection, cement migration which may cause spinal cord damage, paralysis, pulmonary embolism or even death. This interventional procedure involves the use of X-rays and because of the nature of the planned procedure, it is possible that we will have prolonged use of X-ray fluoroscopy. Potential radiation risks to you include (but are not limited to) the following: - A slightly elevated risk for cancer  several years later in life. This risk is typically less than 0.5% percent. This risk is low in comparison to the normal incidence of human cancer, which is 33% for women and 50% for men according to the [employer]. - Radiation induced injury can include skin redness, resembling a rash, tissue breakdown / ulcers and hair loss (which can be temporary or permanent).  The likelihood of either of these occurring depends on the difficulty of the procedure and whether you are sensitive to radiation due to previous procedures, disease, or genetic conditions.  IF your procedure requires a prolonged use of radiation, you will be notified and given written instructions for further action.  It is your responsibility to monitor the irradiated area for the 2 weeks following the procedure and to notify your physician if you are concerned that you have suffered a radiation induced injury.   All of the patient's questions were answered, patient is agreeable to proceed. Consent signed and in chart.   Thank you  for  this interesting consult.  I greatly enjoyed meeting Nancy Clark and look forward to participating in their care.  A copy of this report was sent to the requesting provider on this date.  Electronically Signed: Earley Abide, PA-C 05/24/2018, 9:11 AM   I spent a total of 40 Minutes in face to face in clinical consultation, greater than 50% of which was counseling/coordinating care for L2 pathologic fracture.

## 2018-05-27 ENCOUNTER — Inpatient Hospital Stay (HOSPITAL_BASED_OUTPATIENT_CLINIC_OR_DEPARTMENT_OTHER): Payer: BLUE CROSS/BLUE SHIELD | Admitting: Internal Medicine

## 2018-05-27 ENCOUNTER — Inpatient Hospital Stay: Payer: BLUE CROSS/BLUE SHIELD

## 2018-05-27 ENCOUNTER — Other Ambulatory Visit: Payer: Self-pay

## 2018-05-27 ENCOUNTER — Inpatient Hospital Stay: Payer: BLUE CROSS/BLUE SHIELD | Attending: Internal Medicine

## 2018-05-27 ENCOUNTER — Encounter: Payer: Self-pay | Admitting: Internal Medicine

## 2018-05-27 VITALS — BP 122/78 | HR 97 | Temp 96.8°F | Resp 18 | Ht 60.0 in | Wt 97.8 lb

## 2018-05-27 DIAGNOSIS — C7951 Secondary malignant neoplasm of bone: Secondary | ICD-10-CM | POA: Insufficient documentation

## 2018-05-27 DIAGNOSIS — C772 Secondary and unspecified malignant neoplasm of intra-abdominal lymph nodes: Secondary | ICD-10-CM | POA: Diagnosis not present

## 2018-05-27 DIAGNOSIS — Z87891 Personal history of nicotine dependence: Secondary | ICD-10-CM | POA: Diagnosis not present

## 2018-05-27 DIAGNOSIS — I10 Essential (primary) hypertension: Secondary | ICD-10-CM

## 2018-05-27 DIAGNOSIS — T451X5A Adverse effect of antineoplastic and immunosuppressive drugs, initial encounter: Secondary | ICD-10-CM

## 2018-05-27 DIAGNOSIS — Z79899 Other long term (current) drug therapy: Secondary | ICD-10-CM | POA: Diagnosis not present

## 2018-05-27 DIAGNOSIS — E876 Hypokalemia: Secondary | ICD-10-CM

## 2018-05-27 DIAGNOSIS — R112 Nausea with vomiting, unspecified: Secondary | ICD-10-CM

## 2018-05-27 DIAGNOSIS — C180 Malignant neoplasm of cecum: Secondary | ICD-10-CM | POA: Diagnosis not present

## 2018-05-27 DIAGNOSIS — G620 Drug-induced polyneuropathy: Secondary | ICD-10-CM

## 2018-05-27 DIAGNOSIS — Z5112 Encounter for antineoplastic immunotherapy: Secondary | ICD-10-CM | POA: Diagnosis not present

## 2018-05-27 DIAGNOSIS — Z95828 Presence of other vascular implants and grafts: Secondary | ICD-10-CM

## 2018-05-27 DIAGNOSIS — Z5111 Encounter for antineoplastic chemotherapy: Secondary | ICD-10-CM | POA: Diagnosis not present

## 2018-05-27 DIAGNOSIS — C182 Malignant neoplasm of ascending colon: Secondary | ICD-10-CM

## 2018-05-27 LAB — CBC WITH DIFFERENTIAL/PLATELET
Abs Immature Granulocytes: 0.01 10*3/uL (ref 0.00–0.07)
Basophils Absolute: 0.1 10*3/uL (ref 0.0–0.1)
Basophils Relative: 1 %
Eosinophils Absolute: 0.1 10*3/uL (ref 0.0–0.5)
Eosinophils Relative: 2 %
HCT: 38 % (ref 36.0–46.0)
Hemoglobin: 12 g/dL (ref 12.0–15.0)
Immature Granulocytes: 0 %
Lymphocytes Relative: 22 %
Lymphs Abs: 1.2 10*3/uL (ref 0.7–4.0)
MCH: 30.2 pg (ref 26.0–34.0)
MCHC: 31.6 g/dL (ref 30.0–36.0)
MCV: 95.7 fL (ref 80.0–100.0)
MONOS PCT: 12 %
Monocytes Absolute: 0.7 10*3/uL (ref 0.1–1.0)
Neutro Abs: 3.5 10*3/uL (ref 1.7–7.7)
Neutrophils Relative %: 63 %
PLATELETS: 190 10*3/uL (ref 150–400)
RBC: 3.97 MIL/uL (ref 3.87–5.11)
RDW: 16.8 % — ABNORMAL HIGH (ref 11.5–15.5)
WBC: 5.5 10*3/uL (ref 4.0–10.5)
nRBC: 0 % (ref 0.0–0.2)

## 2018-05-27 LAB — COMPREHENSIVE METABOLIC PANEL
ALBUMIN: 3.6 g/dL (ref 3.5–5.0)
ALT: 10 U/L (ref 0–44)
AST: 16 U/L (ref 15–41)
Alkaline Phosphatase: 91 U/L (ref 38–126)
Anion gap: 9 (ref 5–15)
BUN: 8 mg/dL (ref 6–20)
CO2: 24 mmol/L (ref 22–32)
Calcium: 8.2 mg/dL — ABNORMAL LOW (ref 8.9–10.3)
Chloride: 109 mmol/L (ref 98–111)
Creatinine, Ser: 0.67 mg/dL (ref 0.44–1.00)
GFR calc Af Amer: >60 mL/min (ref >60)
GFR calc non Af Amer: >60 mL/min (ref >60)
Glucose, Bld: 101 mg/dL — ABNORMAL HIGH (ref 70–99)
Potassium: 3.5 mmol/L (ref 3.5–5.1)
Sodium: 142 mmol/L (ref 135–145)
Total Bilirubin: 0.5 mg/dL (ref 0.3–1.2)
Total Protein: 6.8 g/dL (ref 6.5–8.1)

## 2018-05-27 MED ORDER — SODIUM CHLORIDE 0.9 % IV SOLN
2400.0000 mg/m2 | INTRAVENOUS | Status: DC
  Administered 2018-05-27: 3500 mg via INTRAVENOUS
  Filled 2018-05-27: qty 70

## 2018-05-27 MED ORDER — ONDANSETRON HCL 4 MG/2ML IJ SOLN
8.0000 mg | Freq: Once | INTRAMUSCULAR | Status: AC
  Administered 2018-05-27: 8 mg via INTRAVENOUS
  Filled 2018-05-27: qty 4

## 2018-05-27 MED ORDER — HEPARIN SOD (PORK) LOCK FLUSH 100 UNIT/ML IV SOLN: 500.0000 [IU] | Freq: Once | INTRAVENOUS | Status: DC

## 2018-05-27 MED ORDER — HYDROCODONE-ACETAMINOPHEN 5-325 MG PO TABS: 1.0000 | ORAL_TABLET | Freq: Two times a day (BID) | ORAL | 0 refills | Status: DC | PRN

## 2018-05-27 MED ORDER — PROCHLORPERAZINE MALEATE 10 MG PO TABS: 10.0000 mg | ORAL_TABLET | Freq: Four times a day (QID) | ORAL | 3 refills | Status: AC | PRN

## 2018-05-27 MED ORDER — SODIUM CHLORIDE 0.9 % IV SOLN
Freq: Once | INTRAVENOUS | Status: AC
  Administered 2018-05-27: 10:00:00 via INTRAVENOUS
  Filled 2018-05-27: qty 250

## 2018-05-27 MED ORDER — LIDOCAINE-PRILOCAINE 2.5-2.5 % EX CREA: 1.0000 "application " | TOPICAL_CREAM | CUTANEOUS | 6 refills | Status: AC | PRN

## 2018-05-27 MED ORDER — DEXAMETHASONE SODIUM PHOSPHATE 10 MG/ML IJ SOLN
10.0000 mg | Freq: Once | INTRAMUSCULAR | Status: AC
  Administered 2018-05-27: 10 mg via INTRAVENOUS
  Filled 2018-05-27: qty 1

## 2018-05-27 MED ORDER — SODIUM CHLORIDE 0.9% FLUSH
10.0000 mL | Freq: Once | INTRAVENOUS | Status: DC
  Filled 2018-05-27: qty 10

## 2018-05-27 NOTE — Assessment & Plan Note (Addendum)
#   Colon cancer- cecal/ right-sided; STAGE IV; OCT 3rd 2019- CT- slight STABLE liver lesion; but MRI NOV 2019- Stable/improved liver lesion; increasing L-2 lesion; DEC 2019- MRI increasing lesion in L-2 [see below].  CEA -12-15; stable.  Currently on 5-FU; avastin on HOLD.  Stable  # proceed with 5FU infusion today; Labs today reviewed;  acceptable for treatment today.  Will restart Avastin at next visit/in 2 weeks.  #L2 metastases status post ablation [Jan third]; stable.  Continue hydrocodone 1 every 12 hours as needed.  New prescription given.  # Hypokalemia-stable.  Continue potassium once a day.  #Peripheral neuropathy grade 2-continue gabapentin plus Cymbalta-stable  # DISPOSITION:  # Treatment today #  Follow up in -  2 weeks; MD/ labs- cbc/cmp/cea/UA chemo-5FU pump-avastin; pump off in 2 days- Dr.B

## 2018-05-27 NOTE — Progress Notes (Signed)
[name] OFFICE PROGRESS NOTE  Patient Care Team: Nancy Spikes, DO as PCP - General (Family Medicine)  Cancer Staging No matching staging information was found for the patient.   Oncology History   # [facility] 2017- COLON CANCER- STAGE IV [s/p right hemi-colectomy; pT4apN2 (7/19LN)]; Pre-op CEA- [id number]  PET-  mesenteric adenopathy/mediastinal adenopathy/right-sided subclavicular lymph node.    April 17th -START FOLFOX; May 1st Add Avastin;   Aug 16th- Disc OX [sec PN]; con 5FU-Avastin; NOV 27th CT C/A/P- CR; except for 35m LUL; 5FU-Avastin q 3 W [Poor tolerance to FOLFOX].  # June/July 2019- Progression of Liver/Abd LN; July 2019- START FOLFIRI + Avastin; STOP iri 02/25/2018- sec to diarrhea; cont Avastin + 5FU  # March 2018- L2 uptake MET s/p RT [[facility]2018]; mid April X-geva  # L1 ablation/ostecool-  Jan 3rd 2020-    # MOLECULAR TESTING: K-ras-EXON 2- MUTATED; MSI- STABLE.   DIAGNOSIS: colon ca  STAGE:  IV ;GOALS: pallaitive  CURRENT/MOST RECENT THERAPY: 5FU+Avastin      Cancer of ascending colon metastatic to intra-abdominal lymph node (HCC)      INTERVAL HISTORY:  Nancy BRONK89y.o.  female pleasant patient above history of metastatic adenocarcinoma of the colon currently on 5-FU is here for follow-up.  Avastin is on hold because of planned L2 osteo-cool procedure.  Patient had the L2 ablation done on January 3.  She continues to have back pain needing 1-2 hydrocodone's a day.  Denies any blood in stools black or stools.  Mild fatigue.  Mild weight loss.  No significant diarrhea.  Review of Systems  Constitutional: Positive for malaise/fatigue. Negative for chills, diaphoresis, fever and weight loss.  HENT: Negative for nosebleeds and sore throat.   Eyes: Negative for double vision.  Respiratory: Negative for cough, hemoptysis, sputum production, shortness of breath and wheezing.   Cardiovascular: Negative for chest pain, palpitations,  orthopnea and leg swelling.  Gastrointestinal: Positive for blood in stool. Negative for abdominal pain, constipation, heartburn, melena, nausea and vomiting.  Genitourinary: Negative for dysuria, frequency and urgency.  Musculoskeletal: Positive for back pain. Negative for joint pain.  Skin: Negative.  Negative for itching and rash.  Neurological: Positive for tingling. Negative for dizziness, focal weakness, weakness and headaches.  Endo/Heme/Allergies: Does not bruise/bleed easily.  Psychiatric/Behavioral: Negative for depression. The patient is not nervous/anxious and does not have insomnia.       PAST MEDICAL HISTORY :  Past Medical History:  Diagnosis Date  . Chicken pox   . [place]    Partial colectomy 07/2015 + chemo tx's  . Hypertension   . Hypokalemia   . Menopause    > 5 yrs  . Peripheral neuropathy due to chemotherapy ([place]     PAST SURGICAL HISTORY :   Past Surgical History:  Procedure Laterality Date  . BREAST BIOPSY Right    2007? pt unsure done by [facility]  . COLONOSCOPY    . COLONOSCOPY WITH PROPOFOL N/A 10/14/2015   Procedure: COLONOSCOPY WITH PROPOFOL;  Surgeon: PHulen Luster MD;  [facility];  Service: Endoscopy;  Laterality: N/A;  . COLOSTOMY REVISION Right 08/09/2015   Procedure: COLON RESECTION RIGHT;  Surgeon: RFlorene Glen MD;  Location: [facility] ORS;  Service: General;  Laterality: Right;  . ECTOPIC PREGNANCY SURGERY    . IR BONE TUMOR(S)RF ABLATION  05/24/2018  . IR KYPHO LUMBAR INC FX REDUCE BONE BX UNI/BIL CANNULATION INC/IMAGING  05/24/2018  . IR RADIOLOGIST EVAL &  MGMT  05/08/2018  . PORTACATH PLACEMENT N/A 09/01/2015   Procedure: INSERTION PORT-A-CATH;  Surgeon: Florene Glen, MD;  Location: [facility] ORS;  Service: General;  Laterality: N/A;  . SHOULDER SURGERY Left     FAMILY HISTORY :   Family History  Problem Relation Age of Onset  . Hypertension Mother   . Arthritis Father   . Prostate cancer Father   . Diabetes Maternal  Grandmother   . Colon cancer Maternal Grandfather        colon    SOCIAL HISTORY:   Social History   Tobacco Use  . Smoking status: Former Smoker    Packs/day: 0.25    Years: 2.00    Pack years: 0.50  . Smokeless tobacco: Never Used  . Tobacco comment: recently quit  Substance Use Topics  . Alcohol use: No    Alcohol/week: 0.0 standard drinks  . Drug use: No    ALLERGIES:  has No Known Allergies.  MEDICATIONS:  Current Outpatient Medications  Medication Sig Dispense Refill  . acetaminophen (TYLENOL) 325 MG tablet Take 2 tablets (650 mg total) by mouth every 6 (six) hours as needed for mild pain (or Fever >/= 101).    . benazepril (LOTENSIN) 20 MG tablet TAKE 1 TABLET DAILY 90 tablet 4  . DULoxetine (CYMBALTA) 20 MG capsule Take 2 capsules (40 mg total) by mouth daily. 180 capsule 0  . feeding supplement (BOOST HIGH PROTEIN) LIQD Take 1 Container by mouth daily.    . ferrous sulfate (FERROUSUL) 325 (65 FE) MG tablet Take 1 tablet (325 mg total) by mouth daily with breakfast. 90 tablet 3  . gabapentin (NEURONTIN) 300 MG capsule One pill in am; and 2 pills at night prior to sleep. 90 capsule 3  . hydrochlorothiazide (MICROZIDE) 12.5 MG capsule TAKE 1 CAPSULE DAILY 30 capsule 6  . HYDROcodone-acetaminophen (NORCO/VICODIN) 5-325 MG tablet Take 1 tablet by mouth every 12 (twelve) hours as needed for moderate pain. 60 tablet 0  . Multiple Vitamins-Minerals (MULTIVITAMIN ADULT PO) Take 1 tablet by mouth daily.    . potassium chloride SA (K-DUR,KLOR-CON) 20 MEQ tablet 1 pill twice a day 60 tablet 3  . diphenoxylate-atropine (LOMOTIL) 2.5-0.025 MG tablet Take 1 tablet by mouth 4 (four) times daily as needed for diarrhea or loose stools. Take it along with immodium (Patient not taking: Reported on 05/27/2018) 40 tablet 4  . lidocaine (LIDODERM) 5 % Place 1 patch onto the skin daily. Remove & Discard patch within 12 hours or as directed by MD (Patient not taking: Reported on 05/27/2018) 30 patch  2  . lidocaine-prilocaine (EMLA) cream Apply 1 application topically as needed. Apply generously over the Mediport 45 minutes prior to chemotherapy. 30 g 6  . loperamide (IMODIUM) 2 MG capsule Take 2 mg by mouth as needed for diarrhea or loose stools.    . ondansetron (ZOFRAN) 8 MG tablet One pill every 8 hours as needed for nausea/vomitting. (Patient not taking: Reported on 05/27/2018) 60 tablet 0  . prochlorperazine (COMPAZINE) 10 MG tablet Take 1 tablet (10 mg total) by mouth every 6 (six) hours as needed for nausea or vomiting. 90 tablet 3   No current facility-administered medications for this visit.    Facility-Administered Medications Ordered in Other Visits  Medication Dose Route Frequency Provider Last Rate Last Dose  . fluorouracil (ADRUCIL) 3,500 mg in sodium chloride 0.9 % 80 mL chemo infusion  2,400 mg/m2 (Treatment Plan Recorded) Intravenous 1 day or 1 dose [place], Govinda R,  MD      . heparin lock flush 100 unit/mL  500 Units Intravenous Once Charlaine Dalton R, MD      . sodium chloride flush (NS) 0.9 % injection 10 mL  10 mL Intravenous PRN Cammie Sickle, MD   10 mL at 10/04/15 0901  . sodium chloride flush (NS) 0.9 % injection 10 mL  10 mL Intravenous PRN Cammie Sickle, MD   10 mL at 07/02/17 1020  . sodium chloride flush (NS) 0.9 % injection 10 mL  10 mL Intravenous Once Cammie Sickle, MD        PHYSICAL EXAMINATION: ECOG PERFORMANCE STATUS: 1 - Symptomatic but completely ambulatory  BP 122/78 (BP Location: Left Arm, Patient Position: Sitting)   Pulse 97   Temp (!) 96.8 F (36 C) (Tympanic)   Resp 18   Ht 5' (1.524 m)   Wt 97 lb 12.8 oz (44.4 kg)   LMP  (LMP Unknown) Comment: LMP MORE THAN 5 YRS  BMI 19.10 kg/m   Filed Weights   05/27/18 0933  Weight: 97 lb 12.8 oz (44.4 kg)    Physical Exam  Constitutional: She is oriented to person, place, and time and well-developed, well-nourished, and in no distress.  She is accompanied by her  mother.  She is walking herself.  HENT:  Head: Normocephalic and atraumatic.  Mouth/Throat: Oropharynx is clear and moist. No oropharyngeal exudate.  Eyes: Pupils are equal, round, and reactive to light.  Neck: Normal range of motion. Neck supple.  Cardiovascular: Normal rate and regular rhythm.  Pulmonary/Chest: No respiratory distress. She has no wheezes.  Abdominal: Soft. Bowel sounds are normal. She exhibits no distension and no mass. There is no abdominal tenderness. There is no rebound and no guarding.  Musculoskeletal: Normal range of motion.        General: No tenderness or edema.  Neurological: She is alert and oriented to person, place, and time.  Skin: Skin is warm.  Psychiatric: Affect normal.       LABORATORY DATA:  I have reviewed the data as listed    Component Value Date/Time   NA 142 05/27/2018 0906   K 3.5 05/27/2018 0906   CL 109 05/27/2018 0906   CO2 24 05/27/2018 0906   GLUCOSE 101 (H) 05/27/2018 0906   BUN 8 05/27/2018 0906   CREATININE 0.67 05/27/2018 0906   CALCIUM 8.2 (L) 05/27/2018 0906   PROT 6.8 05/27/2018 0906   ALBUMIN 3.6 05/27/2018 0906   AST 16 05/27/2018 0906   ALT 10 05/27/2018 0906   ALKPHOS 91 05/27/2018 0906   BILITOT 0.5 05/27/2018 0906   GFRNONAA >60 05/27/2018 0906   GFRAA >60 05/27/2018 0906    No results found for: SPEP, UPEP  Lab Results  Component Value Date   WBC 5.5 05/27/2018   NEUTROABS 3.5 05/27/2018   HGB 12.0 05/27/2018   HCT 38.0 05/27/2018   MCV 95.7 05/27/2018   PLT 190 05/27/2018      Chemistry      Component Value Date/Time   NA 142 05/27/2018 0906   K 3.5 05/27/2018 0906   CL 109 05/27/2018 0906   CO2 24 05/27/2018 0906   BUN 8 05/27/2018 0906   CREATININE 0.67 05/27/2018 0906      Component Value Date/Time   CALCIUM 8.2 (L) 05/27/2018 0906   ALKPHOS 91 05/27/2018 0906   AST 16 05/27/2018 0906   ALT 10 05/27/2018 0906   BILITOT 0.5 05/27/2018 0906  RADIOGRAPHIC STUDIES: I have  personally reviewed the radiological images as listed and agreed with the findings in the report. No results found.   ASSESSMENT & PLAN:  Cancer of ascending colon metastatic to intra-abdominal lymph node ([place]) # Colon cancer- cecal/ right-sided; STAGE IV; OCT 3rd 2019- CT- slight STABLE liver lesion; but MRI NOV 2019- Stable/improved liver lesion; increasing L-2 lesion; DEC 2019- MRI increasing lesion in L-2 [see below].  CEA -12-15; stable.  Currently on 5-FU; avastin on HOLD.  Stable  # proceed with 5FU infusion today; Labs today reviewed;  acceptable for treatment today.  Will restart Avastin at next visit/in 2 weeks.  #L2 metastases status post ablation [Jan third]; stable.  Continue hydrocodone 1 every 12 hours as needed.  New prescription given.  # Hypokalemia-stable.  Continue potassium once a day.  #Peripheral neuropathy grade 2-continue gabapentin plus Cymbalta-stable  # DISPOSITION:  # Treatment today #  Follow up in -  2 weeks; MD/ labs- cbc/cmp/cea/UA chemo-5FU pump-avastin; pump off in 2 days- Dr.B   Orders Placed This Encounter  Procedures  . CBC with Differential    Standing Status:   Future    Standing Expiration Date:   05/28/2019  . Comprehensive metabolic panel    Standing Status:   Future    Standing Expiration Date:   05/28/2019  . Urinalysis, Complete w Microscopic    Standing Status:   Future    Standing Expiration Date:   05/28/2019  . CEA    Standing Status:   Future    Standing Expiration Date:   05/27/2019   All questions were answered. The patient knows to call the clinic with any problems, questions or concerns.      Cammie Sickle, MD 05/27/2018 10:27 AM

## 2018-05-28 LAB — CEA: CEA: 11.1 ng/mL — ABNORMAL HIGH (ref 0.0–4.7)

## 2018-05-29 ENCOUNTER — Inpatient Hospital Stay: Payer: BLUE CROSS/BLUE SHIELD

## 2018-05-29 VITALS — BP 109/73 | HR 89 | Temp 97.7°F | Resp 18

## 2018-05-29 DIAGNOSIS — C182 Malignant neoplasm of ascending colon: Secondary | ICD-10-CM

## 2018-05-29 DIAGNOSIS — C180 Malignant neoplasm of cecum: Secondary | ICD-10-CM | POA: Diagnosis not present

## 2018-05-29 DIAGNOSIS — C772 Secondary and unspecified malignant neoplasm of intra-abdominal lymph nodes: Principal | ICD-10-CM

## 2018-05-29 MED ORDER — SODIUM CHLORIDE 0.9% FLUSH
10.0000 mL | INTRAVENOUS | Status: DC | PRN
  Administered 2018-05-29: 10 mL
  Filled 2018-05-29: qty 10

## 2018-05-29 MED ORDER — HEPARIN SOD (PORK) LOCK FLUSH 100 UNIT/ML IV SOLN
500.0000 [IU] | Freq: Once | INTRAVENOUS | Status: AC | PRN
  Administered 2018-05-29: 500 [IU]
  Filled 2018-05-29: qty 5

## 2018-06-05 ENCOUNTER — Other Ambulatory Visit: Payer: Self-pay | Admitting: Interventional Radiology

## 2018-06-05 DIAGNOSIS — S32020D Wedge compression fracture of second lumbar vertebra, subsequent encounter for fracture with routine healing: Secondary | ICD-10-CM

## 2018-06-10 ENCOUNTER — Inpatient Hospital Stay: Payer: BLUE CROSS/BLUE SHIELD

## 2018-06-10 ENCOUNTER — Inpatient Hospital Stay (HOSPITAL_BASED_OUTPATIENT_CLINIC_OR_DEPARTMENT_OTHER): Payer: BLUE CROSS/BLUE SHIELD | Admitting: Internal Medicine

## 2018-06-10 VITALS — BP 124/75 | HR 75 | Temp 97.6°F | Resp 18 | Ht 60.0 in | Wt 98.5 lb

## 2018-06-10 DIAGNOSIS — C7951 Secondary malignant neoplasm of bone: Secondary | ICD-10-CM | POA: Diagnosis not present

## 2018-06-10 DIAGNOSIS — C772 Secondary and unspecified malignant neoplasm of intra-abdominal lymph nodes: Secondary | ICD-10-CM | POA: Diagnosis not present

## 2018-06-10 DIAGNOSIS — E876 Hypokalemia: Secondary | ICD-10-CM

## 2018-06-10 DIAGNOSIS — C182 Malignant neoplasm of ascending colon: Secondary | ICD-10-CM

## 2018-06-10 DIAGNOSIS — C180 Malignant neoplasm of cecum: Secondary | ICD-10-CM | POA: Diagnosis not present

## 2018-06-10 LAB — URINALYSIS, COMPLETE (UACMP) WITH MICROSCOPIC
BILIRUBIN URINE: NEGATIVE
Bacteria, UA: NONE SEEN
GLUCOSE, UA: NEGATIVE mg/dL
Hgb urine dipstick: NEGATIVE
Ketones, ur: NEGATIVE mg/dL
Leukocytes, UA: NEGATIVE
NITRITE: NEGATIVE
Protein, ur: NEGATIVE mg/dL
Specific Gravity, Urine: 1.014 (ref 1.005–1.030)
pH: 5 (ref 5.0–8.0)

## 2018-06-10 LAB — COMPREHENSIVE METABOLIC PANEL
ALT: 11 U/L (ref 0–44)
ANION GAP: 8 (ref 5–15)
AST: 16 U/L (ref 15–41)
Albumin: 3.5 g/dL (ref 3.5–5.0)
Alkaline Phosphatase: 71 U/L (ref 38–126)
BUN: 7 mg/dL (ref 6–20)
CO2: 23 mmol/L (ref 22–32)
Calcium: 8 mg/dL — ABNORMAL LOW (ref 8.9–10.3)
Chloride: 109 mmol/L (ref 98–111)
Creatinine, Ser: 0.69 mg/dL (ref 0.44–1.00)
GFR calc Af Amer: >60 mL/min (ref >60)
GFR calc non Af Amer: >60 mL/min (ref >60)
Glucose, Bld: 96 mg/dL (ref 70–99)
Potassium: 3.8 mmol/L (ref 3.5–5.1)
Sodium: 140 mmol/L (ref 135–145)
Total Bilirubin: 0.4 mg/dL (ref 0.3–1.2)
Total Protein: 6.6 g/dL (ref 6.5–8.1)

## 2018-06-10 LAB — CBC WITH DIFFERENTIAL/PLATELET
Abs Immature Granulocytes: 0.02 10*3/uL (ref 0.00–0.07)
Basophils Absolute: 0 10*3/uL (ref 0.0–0.1)
Basophils Relative: 1 %
Eosinophils Absolute: 0.1 10*3/uL (ref 0.0–0.5)
Eosinophils Relative: 2 %
HCT: 36.5 % (ref 36.0–46.0)
HEMOGLOBIN: 11.5 g/dL — AB (ref 12.0–15.0)
IMMATURE GRANULOCYTES: 0 %
LYMPHS PCT: 20 %
Lymphs Abs: 1.3 10*3/uL (ref 0.7–4.0)
MCH: 30.3 pg (ref 26.0–34.0)
MCHC: 31.5 g/dL (ref 30.0–36.0)
MCV: 96.3 fL (ref 80.0–100.0)
Monocytes Absolute: 0.6 10*3/uL (ref 0.1–1.0)
Monocytes Relative: 9 %
NEUTROS PCT: 68 %
Neutro Abs: 4.4 10*3/uL (ref 1.7–7.7)
Platelets: 153 10*3/uL (ref 150–400)
RBC: 3.79 MIL/uL — ABNORMAL LOW (ref 3.87–5.11)
RDW: 16.6 % — ABNORMAL HIGH (ref 11.5–15.5)
WBC: 6.4 10*3/uL (ref 4.0–10.5)
nRBC: 0 % (ref 0.0–0.2)

## 2018-06-10 MED ORDER — DULOXETINE HCL 20 MG PO CPEP: 40.0000 mg | ORAL_CAPSULE | Freq: Every day | ORAL | 3 refills | Status: DC

## 2018-06-10 MED ORDER — SODIUM CHLORIDE 0.9 % IV SOLN
Freq: Once | INTRAVENOUS | Status: AC
  Administered 2018-06-10: 10:00:00 via INTRAVENOUS
  Filled 2018-06-10: qty 250

## 2018-06-10 MED ORDER — DENOSUMAB 120 MG/1.7ML ~~LOC~~ SOLN: 120.0000 mg | Freq: Once | SUBCUTANEOUS | Status: DC

## 2018-06-10 MED ORDER — GABAPENTIN 300 MG PO CAPS: ORAL_CAPSULE | ORAL | 3 refills | Status: DC

## 2018-06-10 MED ORDER — SODIUM CHLORIDE 0.9 % IV SOLN
5.0000 mg/kg | INTRAVENOUS | Status: DC
  Administered 2018-06-10: 250 mg via INTRAVENOUS
  Filled 2018-06-10: qty 10

## 2018-06-10 MED ORDER — DEXAMETHASONE SODIUM PHOSPHATE 10 MG/ML IJ SOLN
10.0000 mg | Freq: Once | INTRAMUSCULAR | Status: AC
  Administered 2018-06-10: 10 mg via INTRAVENOUS
  Filled 2018-06-10: qty 1

## 2018-06-10 MED ORDER — SODIUM CHLORIDE 0.9% FLUSH
10.0000 mL | INTRAVENOUS | Status: DC | PRN
  Administered 2018-06-10: 10 mL via INTRAVENOUS
  Filled 2018-06-10: qty 10

## 2018-06-10 MED ORDER — SODIUM CHLORIDE 0.9 % IV SOLN
2400.0000 mg/m2 | INTRAVENOUS | Status: DC
  Administered 2018-06-10: 3500 mg via INTRAVENOUS
  Filled 2018-06-10: qty 70

## 2018-06-10 MED ORDER — ONDANSETRON HCL 4 MG/2ML IJ SOLN
8.0000 mg | Freq: Once | INTRAMUSCULAR | Status: AC
  Administered 2018-06-10: 8 mg via INTRAVENOUS
  Filled 2018-06-10: qty 4

## 2018-06-10 MED ORDER — HEPARIN SOD (PORK) LOCK FLUSH 100 UNIT/ML IV SOLN: 500.0000 [IU] | Freq: Once | INTRAVENOUS | Status: DC

## 2018-06-10 NOTE — Assessment & Plan Note (Addendum)
#   Colon cancer- cecal/ right-sided; STAGE IV; OCT 3rd 2019- CT- slight STABLE liver lesion; but MRI NOV 2019- Stable/improved liver lesion; increasing L-2 lesion; DEC 2019- MRI increasing lesion in L-2 [see below].  CEA -12-15; stable.    # proceed with 5FU infusion today; Labs today reviewed;  acceptable for treatment today.    #L2 metastases status post ablation [Jan third]; improved.  Continue hydrocodone 1 veery other day. Hold x- geva ca- 8.0; reminded to take ca+vitD.   # Hypokalemia-stable.  Continue potassium once a day.  #Peripheral stable. New scripts given.   # DISPOSITION:  # Treatment today- 5FU+avatsin; Hold X-geva today #  Follow up in  2 weeks; MD/ labs- cbc/cmp/cea/UA chemo-5FU pump-avastin; pump off in 2 days- Dr.B

## 2018-06-10 NOTE — Progress Notes (Signed)
[place] OFFICE PROGRESS NOTE  Patient Care Team: Coral Spikes, DO as PCP - General (Family Medicine)  Cancer Staging No matching staging information was found for the patient.   Oncology History   # [facility] 2017- COLON CANCER- STAGE IV [s/p right hemi-colectomy; pT4apN2 (7/19LN)]; Pre-op CEA- [id number]-  PET-  mesenteric adenopathy/mediastinal adenopathy/right-sided subclavicular lymph node.    April 17th -START FOLFOX; May 1st Add Avastin;   Aug 16th- Disc OX [sec PN]; con 5FU-Avastin; NOV 27th CT C/A/P- CR; except for 40m LUL; 5FU-Avastin q 3 W [Poor tolerance to FOLFOX].  # June/July 2019- Progression of Liver/Abd LN; July 2019- START FOLFIRI + Avastin; STOP iri 02/25/2018- sec to diarrhea; cont Avastin + 5FU  # March 2018- L2 uptake MET s/p RT [[facility]2018]; mid April X-geva  # L1 ablation/ostecool-  Jan 3rd 2020-    # MOLECULAR TESTING: K-ras-EXON 2- MUTATED; MSI- STABLE.   DIAGNOSIS: colon ca  STAGE:  IV ;GOALS: pallaitive  CURRENT/MOST RECENT THERAPY: 5FU+Avastin      Cancer of ascending colon metastatic to intra-abdominal lymph node (HCC)      INTERVAL HISTORY:  MSAKIYAH SHUR89y.o.  female pleasant patient above history of metastatic adenocarcinoma of the colon currently on 5-FU is here for follow-up.   Patient notes to have improvement of her back pain since ablation procedure.  She is taking hydrocodone currently maybe 1 every other day.  Denies any blood in stools black or stools.  Mild fatigue.  Mild weight loss.  No significant diarrhea.  Review of Systems  Constitutional: Positive for malaise/fatigue. Negative for chills, diaphoresis, fever and weight loss.  HENT: Negative for nosebleeds and sore throat.   Eyes: Negative for double vision.  Respiratory: Negative for cough, hemoptysis, sputum production, shortness of breath and wheezing.   Cardiovascular: Negative for chest pain, palpitations, orthopnea and leg swelling.   Gastrointestinal: Negative for abdominal pain, constipation, heartburn, melena, nausea and vomiting.  Genitourinary: Negative for dysuria, frequency and urgency.  Musculoskeletal: Positive for back pain. Negative for joint pain.  Skin: Negative.  Negative for itching and rash.  Neurological: Positive for tingling. Negative for dizziness, focal weakness, weakness and headaches.  Endo/Heme/Allergies: Does not bruise/bleed easily.  Psychiatric/Behavioral: Negative for depression. The patient is not nervous/anxious and does not have insomnia.       PAST MEDICAL HISTORY :  Past Medical History:  Diagnosis Date  . Chicken pox   . [place] cancer ([place]    Partial colectomy 07/2015 + chemo tx's  . Hypertension   . Hypokalemia   . Menopause    > 5 yrs  . Peripheral neuropathy due to chemotherapy ([facility]     PAST SURGICAL HISTORY :   Past Surgical History:  Procedure Laterality Date  . BREAST BIOPSY Right    2007? pt unsure done by [facility]  . COLONOSCOPY    . COLONOSCOPY WITH PROPOFOL N/A 10/14/2015   Procedure: COLONOSCOPY WITH PROPOFOL;  Surgeon: PHulen Luster MD;  [facility];  Service: Endoscopy;  Laterality: N/A;  . COLOSTOMY REVISION Right 08/09/2015   Procedure: COLON RESECTION RIGHT;  Surgeon: RFlorene Glen MD;  Location: [facility] ORS;  Service: General;  Laterality: Right;  . ECTOPIC PREGNANCY SURGERY    . IR BONE TUMOR(S)RF ABLATION  05/24/2018  . IR KYPHO LUMBAR INC FX REDUCE BONE BX UNI/BIL CANNULATION INC/IMAGING  05/24/2018  . IR RADIOLOGIST EVAL & MGMT  05/08/2018  . PORTACATH PLACEMENT N/A 09/01/2015   Procedure: INSERTION  PORT-A-CATH;  Surgeon: Florene Glen, MD;  Location: [facility] ORS;  Service: General;  Laterality: N/A;  . SHOULDER SURGERY Left     FAMILY HISTORY :   Family History  Problem Relation Age of Onset  . Hypertension Mother   . Arthritis Father   . Prostate cancer Father   . Diabetes Maternal Grandmother   . Colon cancer Maternal Grandfather         colon    SOCIAL HISTORY:   Social History   Tobacco Use  . Smoking status: Former Smoker    Packs/day: 0.25    Years: 2.00    Pack years: 0.50  . Smokeless tobacco: Never Used  . Tobacco comment: recently quit  Substance Use Topics  . Alcohol use: No    Alcohol/week: 0.0 standard drinks  . Drug use: No    ALLERGIES:  has No Known Allergies.  MEDICATIONS:  Current Outpatient Medications  Medication Sig Dispense Refill  . acetaminophen (TYLENOL) 325 MG tablet Take 2 tablets (650 mg total) by mouth every 6 (six) hours as needed for mild pain (or Fever >/= 101).    . benazepril (LOTENSIN) 20 MG tablet TAKE 1 TABLET DAILY 90 tablet 4  . DULoxetine (CYMBALTA) 20 MG capsule Take 2 capsules (40 mg total) by mouth daily. 180 capsule 3  . feeding supplement (BOOST HIGH PROTEIN) LIQD Take 1 Container by mouth daily.    . ferrous sulfate (FERROUSUL) 325 (65 FE) MG tablet Take 1 tablet (325 mg total) by mouth daily with breakfast. 90 tablet 3  . gabapentin (NEURONTIN) 300 MG capsule One pill in am; and 2 pills at night prior to sleep. 90 capsule 3  . hydrochlorothiazide (MICROZIDE) 12.5 MG capsule TAKE 1 CAPSULE DAILY 30 capsule 6  . HYDROcodone-acetaminophen (NORCO/VICODIN) 5-325 MG tablet Take 1 tablet by mouth every 12 (twelve) hours as needed for moderate pain. 60 tablet 0  . lidocaine-prilocaine (EMLA) cream Apply 1 application topically as needed. Apply generously over the Mediport 45 minutes prior to chemotherapy. 30 g 6  . loperamide (IMODIUM) 2 MG capsule Take 2 mg by mouth as needed for diarrhea or loose stools.    . Multiple Vitamins-Minerals (MULTIVITAMIN ADULT PO) Take 1 tablet by mouth daily.    . ondansetron (ZOFRAN) 8 MG tablet One pill every 8 hours as needed for nausea/vomitting. 60 tablet 0  . potassium chloride SA (K-DUR,KLOR-CON) 20 MEQ tablet 1 pill twice a day 60 tablet 3  . prochlorperazine (COMPAZINE) 10 MG tablet Take 1 tablet (10 mg total) by mouth every 6 (six)  hours as needed for nausea or vomiting. 90 tablet 3  . diphenoxylate-atropine (LOMOTIL) 2.5-0.025 MG tablet Take 1 tablet by mouth 4 (four) times daily as needed for diarrhea or loose stools. Take it along with immodium (Patient not taking: Reported on 05/27/2018) 40 tablet 4  . lidocaine (LIDODERM) 5 % Place 1 patch onto the skin daily. Remove & Discard patch within 12 hours or as directed by MD (Patient not taking: Reported on 05/27/2018) 30 patch 2   No current facility-administered medications for this visit.    Facility-Administered Medications Ordered in Other Visits  Medication Dose Route Frequency Provider Last Rate Last Dose  . 0.9 %  sodium chloride infusion   Intravenous Once Charlaine Dalton R, MD      . bevacizumab (AVASTIN) 250 mg in sodium chloride 0.9 % 100 mL chemo infusion  5 mg/kg (Treatment Plan Recorded) Intravenous Q14 Days Cammie Sickle, MD      .  denosumab (XGEVA) injection 120 mg  120 mg Subcutaneous Once Charlaine Dalton R, MD      . dexamethasone (DECADRON) injection 10 mg  10 mg Intravenous Once Charlaine Dalton R, MD      . fluorouracil (ADRUCIL) 3,500 mg in sodium chloride 0.9 % 80 mL chemo infusion  2,400 mg/m2 (Treatment Plan Recorded) Intravenous 1 day or 1 dose Charlaine Dalton R, MD      . heparin lock flush 100 unit/mL  500 Units Intravenous Once Charlaine Dalton R, MD      . ondansetron [facility]) injection 8 mg  8 mg Intravenous Once Charlaine Dalton R, MD      . sodium chloride flush (NS) 0.9 % injection 10 mL  10 mL Intravenous PRN Cammie Sickle, MD   10 mL at 10/04/15 0901  . sodium chloride flush (NS) 0.9 % injection 10 mL  10 mL Intravenous PRN Cammie Sickle, MD   10 mL at 07/02/17 1020  . sodium chloride flush (NS) 0.9 % injection 10 mL  10 mL Intravenous PRN Cammie Sickle, MD   10 mL at 06/10/18 0848    PHYSICAL EXAMINATION: ECOG PERFORMANCE STATUS: 1 - Symptomatic but completely ambulatory  BP 124/75 (BP  Location: Right Arm, Patient Position: Sitting)   Pulse 75   Temp 97.6 F (36.4 C) (Tympanic)   Resp 18   Ht 5' (1.524 m)   Wt 98 lb 8 oz (44.7 kg)   LMP  (LMP Unknown) Comment: LMP MORE THAN 5 YRS  BMI 19.24 kg/m   Filed Weights   06/10/18 0910  Weight: 98 lb 8 oz (44.7 kg)    Physical Exam  Constitutional: She is oriented to person, place, and time and well-developed, well-nourished, and in no distress.  She is accompanied by her mother.  She is walking herself.  HENT:  Head: Normocephalic and atraumatic.  Mouth/Throat: Oropharynx is clear and moist. No oropharyngeal exudate.  Eyes: Pupils are equal, round, and reactive to light.  Neck: Normal range of motion. Neck supple.  Cardiovascular: Normal rate and regular rhythm.  Pulmonary/Chest: No respiratory distress. She has no wheezes.  Abdominal: Soft. Bowel sounds are normal. She exhibits no distension and no mass. There is no abdominal tenderness. There is no rebound and no guarding.  Musculoskeletal: Normal range of motion.        General: No tenderness or edema.  Neurological: She is alert and oriented to person, place, and time.  Skin: Skin is warm.  Psychiatric: Affect normal.       LABORATORY DATA:  I have reviewed the data as listed    Component Value Date/Time   NA 140 06/10/2018 0848   K 3.8 06/10/2018 0848   CL 109 06/10/2018 0848   CO2 23 06/10/2018 0848   GLUCOSE 96 06/10/2018 0848   BUN 7 06/10/2018 0848   CREATININE 0.69 06/10/2018 0848   CALCIUM 8.0 (L) 06/10/2018 0848   PROT 6.6 06/10/2018 0848   ALBUMIN 3.5 06/10/2018 0848   AST 16 06/10/2018 0848   ALT 11 06/10/2018 0848   ALKPHOS 71 06/10/2018 0848   BILITOT 0.4 06/10/2018 0848   GFRNONAA >60 06/10/2018 0848   GFRAA >60 06/10/2018 0848    No results found for: SPEP, UPEP  Lab Results  Component Value Date   WBC 6.4 06/10/2018   NEUTROABS 4.4 06/10/2018   HGB 11.5 (L) 06/10/2018   HCT 36.5 06/10/2018   MCV 96.3 06/10/2018   PLT  153 06/10/2018  Chemistry      Component Value Date/Time   NA 140 06/10/2018 0848   K 3.8 06/10/2018 0848   CL 109 06/10/2018 0848   CO2 23 06/10/2018 0848   BUN 7 06/10/2018 0848   CREATININE 0.69 06/10/2018 0848      Component Value Date/Time   CALCIUM 8.0 (L) 06/10/2018 0848   ALKPHOS 71 06/10/2018 0848   AST 16 06/10/2018 0848   ALT 11 06/10/2018 0848   BILITOT 0.4 06/10/2018 0848       RADIOGRAPHIC STUDIES: I have personally reviewed the radiological images as listed and agreed with the findings in the report. No results found.   ASSESSMENT & PLAN:  Cancer of ascending colon metastatic to intra-abdominal lymph node ([place]) # Colon cancer- cecal/ right-sided; STAGE IV; OCT 3rd 2019- CT- slight STABLE liver lesion; but MRI NOV 2019- Stable/improved liver lesion; increasing L-2 lesion; DEC 2019- MRI increasing lesion in L-2 [see below].  CEA -12-15; stable.    # proceed with 5FU infusion today; Labs today reviewed;  acceptable for treatment today.    #L2 metastases status post ablation [Jan third]; improved.  Continue hydrocodone 1 veery other day. Hold x- geva ca- 8.0; reminded to take ca+vitD.   # Hypokalemia-stable.  Continue potassium once a day.  #Peripheral stable. New scripts given.   # DISPOSITION:  # Treatment today- 5FU+avatsin; Hold X-geva today #  Follow up in  2 weeks; MD/ labs- cbc/cmp/cea/UA chemo-5FU pump-avastin; pump off in 2 days- Dr.B   No orders of the defined types were placed in this encounter.  All questions were answered. The patient knows to call the clinic with any problems, questions or concerns.      Cammie Sickle, MD 06/10/2018 10:03 AM

## 2018-06-10 NOTE — Addendum Note (Signed)
Addended by: Sandria Bales B on: 06/10/2018 11:00 AM   Modules accepted: Orders

## 2018-06-11 LAB — CEA: CEA: 9.8 ng/mL — ABNORMAL HIGH (ref 0.0–4.7)

## 2018-06-12 ENCOUNTER — Inpatient Hospital Stay: Payer: BLUE CROSS/BLUE SHIELD

## 2018-06-12 VITALS — BP 113/71 | HR 80 | Resp 20

## 2018-06-12 DIAGNOSIS — C180 Malignant neoplasm of cecum: Secondary | ICD-10-CM | POA: Diagnosis not present

## 2018-06-12 DIAGNOSIS — C772 Secondary and unspecified malignant neoplasm of intra-abdominal lymph nodes: Principal | ICD-10-CM

## 2018-06-12 DIAGNOSIS — C182 Malignant neoplasm of ascending colon: Secondary | ICD-10-CM

## 2018-06-12 MED ORDER — HEPARIN SOD (PORK) LOCK FLUSH 100 UNIT/ML IV SOLN
500.0000 [IU] | Freq: Once | INTRAVENOUS | Status: AC | PRN
  Administered 2018-06-12: 500 [IU]

## 2018-06-18 ENCOUNTER — Ambulatory Visit
Admission: RE | Admit: 2018-06-18 | Discharge: 2018-06-18 | Disposition: A | Discharge status: Home / Self Care | Payer: BLUE CROSS/BLUE SHIELD | Source: Ambulatory Visit | Attending: Interventional Radiology | Admitting: Interventional Radiology

## 2018-06-18 ENCOUNTER — Encounter: Payer: Self-pay | Admitting: Radiology

## 2018-06-18 DIAGNOSIS — S32020D Wedge compression fracture of second lumbar vertebra, subsequent encounter for fracture with routine healing: Secondary | ICD-10-CM

## 2018-06-18 HISTORY — PX: IR RADIOLOGIST EVAL & MGMT: IMG5224

## 2018-06-18 NOTE — Progress Notes (Signed)
Chief Complaint: Osseous metastases  Referring Physician(s): Dr. Rogue Bussing  History of Present Illness: Nancy Clark is a 58 y.o. female presenting as a scheduled follow up to [place] clinic today, SP Osteocool treatment of L2 lesion with same-session vertebral augmentation, performed 05/24/2018.    She is here today again with her mother for the interview.    We performed our treatment 05/24/2018 with bipedicular approach RFA ablation of the L2 level for a metastatic focus and significant 8-10/10 intensity pain.  Kyphoplasty was performed at the same time.    She was discharged home the same day.   Today she tells me that she had no problems afterwards, and remembers experiencing significant pain relief immediately.  She continues to have very nice control of her prior pain at this site.  She says currently the intensity in only 4/10 or less.  She is able to comfortably perform all of her ADL's.  She only occasionally takes hydrocodone, she tells me, just a few times per week.  She is sleeping much better.   Overall she is very pleased.   She denies fever, rigors, chills.    She has no new complaints.    Past Medical History:  Diagnosis Date  . Chicken pox   . Colon cancer ([place])    Partial colectomy 07/2015 + chemo tx's  . Hypertension   . Hypokalemia   . Menopause    > 5 yrs  . Peripheral neuropathy due to chemotherapy [facility])     Past Surgical History:  Procedure Laterality Date  . BREAST BIOPSY Right    2007? pt unsure done by [facility]  . COLONOSCOPY    . COLONOSCOPY WITH PROPOFOL N/A 10/14/2015   Procedure: COLONOSCOPY WITH PROPOFOL;  Surgeon: Hulen Luster, MD;  Location: [facility] ENDOSCOPY;  Service: Endoscopy;  Laterality: N/A;  . COLOSTOMY REVISION Right 08/09/2015   Procedure: COLON RESECTION RIGHT;  Surgeon: Florene Glen, MD;  Location: [facility] ORS;  Service: General;  Laterality: Right;  . ECTOPIC PREGNANCY SURGERY    . IR BONE TUMOR(S)RF ABLATION  05/24/2018  . IR KYPHO  LUMBAR INC FX REDUCE BONE BX UNI/BIL CANNULATION INC/IMAGING  05/24/2018  . IR RADIOLOGIST EVAL & MGMT  05/08/2018  . PORTACATH PLACEMENT N/A 09/01/2015   Procedure: INSERTION PORT-A-CATH;  Surgeon: Florene Glen, MD;  Location: [facility] ORS;  Service: General;  Laterality: N/A;  . SHOULDER SURGERY Left     Allergies: Patient has no known allergies.  Medications: Prior to Admission medications   Medication Sig Start Date End Date Taking? Authorizing Provider  acetaminophen (TYLENOL) 325 MG tablet Take 2 tablets (650 mg total) by mouth every 6 (six) hours as needed for mild pain (or Fever >/= 101). 10/14/15   Nicholes Mango, MD  benazepril (LOTENSIN) 20 MG tablet TAKE 1 TABLET DAILY 04/05/18   Cammie Sickle, MD  diphenoxylate-atropine (LOMOTIL) 2.5-0.025 MG tablet Take 1 tablet by mouth 4 (four) times daily as needed for diarrhea or loose stools. Take it along with immodium Patient not taking: Reported on 05/27/2018 12/17/17   Cammie Sickle, MD  DULoxetine (CYMBALTA) 20 MG capsule Take 2 capsules (40 mg total) by mouth daily. 06/10/18   Cammie Sickle, MD  feeding supplement (BOOST HIGH PROTEIN) LIQD Take 1 Container by mouth daily.    [provider]  ferrous sulfate (FERROUSUL) 325 (65 FE) MG tablet Take 1 tablet (325 mg total) by mouth daily with breakfast. 10/14/15   Nicholes Mango, MD  gabapentin (NEURONTIN) 300 MG capsule One pill in am; and 2 pills at night prior to sleep. 06/10/18   Cammie Sickle, MD  hydrochlorothiazide (MICROZIDE) 12.5 MG capsule TAKE 1 CAPSULE DAILY 12/10/17   Jacquelin Hawking, NP  HYDROcodone-acetaminophen (NORCO/VICODIN) 5-325 MG tablet Take 1 tablet by mouth every 12 (twelve) hours as needed for moderate pain. 05/27/18   Cammie Sickle, MD  lidocaine (LIDODERM) 5 % Place 1 patch onto the skin daily. Remove & Discard patch within 12 hours or as directed by MD Patient not taking: Reported on 05/27/2018 02/11/18   Cammie Sickle, MD   lidocaine-prilocaine (EMLA) cream Apply 1 application topically as needed. Apply generously over the Mediport 45 minutes prior to chemotherapy. 05/27/18   Cammie Sickle, MD  loperamide (IMODIUM) 2 MG capsule Take 2 mg by mouth as needed for diarrhea or loose stools.    [provider]  Multiple Vitamins-Minerals (MULTIVITAMIN ADULT PO) Take 1 tablet by mouth daily.    [provider]  ondansetron (ZOFRAN) 8 MG tablet One pill every 8 hours as needed for nausea/vomitting. 03/25/18   Cammie Sickle, MD  potassium chloride SA (K-DUR,KLOR-CON) 20 MEQ tablet 1 pill twice a day 12/17/17   Cammie Sickle, MD  prochlorperazine (COMPAZINE) 10 MG tablet Take 1 tablet (10 mg total) by mouth every 6 (six) hours as needed for nausea or vomiting. 05/27/18   Cammie Sickle, MD     Family History  Problem Relation Age of Onset  . Hypertension Mother   . Arthritis Father   . Prostate cancer Father   . Diabetes Maternal Grandmother   . Colon cancer Maternal Grandfather        colon    Social History   Socioeconomic History  . Marital status: Married    Spouse name: Not on file  . Number of children: Not on file  . Years of education: Not on file  . Highest education level: Not on file  Occupational History  . Not on file  Social Needs  . Financial resource strain: Not on file  . Food insecurity:    Worry: Not on file    Inability: Not on file  . Transportation needs:    Medical: Not on file    Non-medical: Not on file  Tobacco Use  . Smoking status: Former Smoker    Packs/day: 0.25    Years: 2.00    Pack years: 0.50  . Smokeless tobacco: Never Used  . Tobacco comment: recently quit  Substance and Sexual Activity  . Alcohol use: No    Alcohol/week: 0.0 standard drinks  . Drug use: No  . Sexual activity: Yes  Lifestyle  . Physical activity:    Days per week: Not on file    Minutes per session: Not on file  . Stress: Not on file    Relationships  . Social connections:    Talks on phone: Not on file    Gets together: Not on file    Attends religious service: Not on file    Active member of club or organization: Not on file    Attends meetings of clubs or organizations: Not on file    Relationship status: Not on file  Other Topics Concern  . Not on file  Social History Narrative  . Not on file    ECOG Status: 1 - Symptomatic but completely ambulatory  Review of Systems: A 12 point ROS discussed and pertinent positives are indicated  in the HPI above.  All other systems are negative.  Review of Systems  Vital Signs: BP 92/72   Pulse 85   Temp 98.2 F (36.8 C) (Oral)   Resp 14   Ht 5' (1.524 m)   Wt 44.5 kg   LMP  (LMP Unknown) Comment: LMP MORE THAN 5 YRS  SpO2 100%   BMI 19.14 kg/m   Physical Exam General: 58 yo female appearing older than stated age.  Well-developed, well-nourished.  No distress. HEENT: Atraumatic, normocephalic.  Conjugate gaze, extra-ocular motor intact. No scleral icterus or scleral injection. No lesions on external ears, nose, lips, or gums.  Oral mucosa moist, pink.  Neck: Symmetric with no goiter enlargement.  Chest/Lungs:  Symmetric chest with inspiration/expiration.  No labored breathing.     Heart:   No JVD appreciated.    Genito-urinary: Deferred Neurologic: Alert & Oriented to person, place, and time.   Normal affect and insight.  Appropriate questions.  Moving all 4 extremities with gross sensory intact.   Extremities: Healed incision sites at the lower lumbar back, with no erythema or sign of infection.  No new palpable abnormality.  No new TTP.  Significant reduction in TTP from pre-treatment.    Imaging: Ir Bone Tumor(s)rf Ablation  Result Date: 05/24/2018 INDICATION: 58 year old female with a history of pathologic compression fracture of L2, symptomatic. She presents today for treatment with RF ablation and vertebral augmentation EXAM: IMAGE GUIDED ABLATION OF L2  PATHOLOGIC FRACTURE WITH RF TECHNOLOGY VERTEBRAL AUGMENTATION OF L2 WITH KYPHOPLASTY COMPARISON:  MR 05/04/2018, CT 02/22/2018 MEDICATIONS: As antibiotic prophylaxis, 2 g Ancef was ordered pre-procedure and administered intravenously within 1 hour of incision. ANESTHESIA/SEDATION: Moderate (conscious) sedation was employed during this procedure. A total of Versed 2.0 mg and Fentanyl 100 mcg was administered intravenously. Moderate Sedation Time: 36 minutes. The patient's level of consciousness and vital signs were monitored continuously by radiology nursing throughout the procedure under my direct supervision. FLUOROSCOPY TIME:  Fluoroscopy Time: 10 minutes 54 seconds (616 mGy) COMPLICATIONS: None PROCEDURE: Following a full explanation of the procedure along with the potentially associated complications, a witnessed informed consent was obtained. Specific risks that were discussed included bleeding, infection, injury to adjacent structures, neurologic injury, embolization of cement within the veins, failure of the procedure to improve pain, need for further procedure/ surgery, cardiopulmonary collapse, death. The patient understands the risks and wishes to proceed. The patient was placed prone on the fluoroscopic table. Nasal oxygen was administered. Physiologic monitoring was performed throughout the duration of the procedure. The skin overlying the L2 region was prepped and draped in the usual sterile fashion. The L2 vertebral body was identified and the right pedicle was infiltrated with 1% lidocaine. This was then followed by the advancement of Medtronic Osteocool Trocar needle through the right pedicle into the anterior one-third. The left pedicle was then infiltrated with 1% lidocaine. A small incision was made with 11 blade scalpel, and then a Medtronic Osteocool Trocar needle was advanced through the left pedicle into the anterior 1/3 of the vertebral body. The bone drill/measuring tool was then advanced  through the bilateral pedicles to estimate length of the required Osteocool ablation device. 15 mm device selected for the right pedicle and 15 mm device selected for the left pedicle. Once position was confirmed, ablation was initiated for total of 11 minutes left, 11 minutes right. The RFA ablation devices were then removed. Balloon cannula were advanced bilaterally. Simultaneous inflation of the left and right pedicular cannula balloons was  performed under fluoroscopic observation. Methylmethacrylate mixture was then reconstituted. Under biplane intermittent fluoroscopy, the methylmethacrylate was then injected into the cavity of L2 vertebral body with filling of the space. No extravasation was noted posteriorly into the spinal canal. No epidural venous contamination was seen. The needles were then removed. Hemostasis was achieved at the skin entry site. There were no acute complications. Patient tolerated the procedure well. The patient was observed for 30 minutes and returned to room in good condition. IMPRESSION: Status post treatment of symptomatic L2 pathologic fracture with RF ablation and vertebral augmentation with bipedicular kyphoplasty. Signed, Dulcy Fanny. Dellia Nims, RPVI [facility] Radiology Electronically Signed   By: Corrie Mckusick D.O.   On: 05/24/2018 12:34   Ir Kypho Lumbar Inc Fx Reduce Bone Bx Uni/bil Cannulation Inc/imaging  Result Date: 05/24/2018 INDICATION: 58 year old female with a history of pathologic compression fracture of L2, symptomatic. She presents today for treatment with RF ablation and vertebral augmentation EXAM: IMAGE GUIDED ABLATION OF L2 PATHOLOGIC FRACTURE WITH RF TECHNOLOGY VERTEBRAL AUGMENTATION OF L2 WITH KYPHOPLASTY COMPARISON:  MR 05/04/2018, CT 02/22/2018 MEDICATIONS: As antibiotic prophylaxis, 2 g Ancef was ordered pre-procedure and administered intravenously within 1 hour of incision. ANESTHESIA/SEDATION: Moderate  (conscious) sedation was employed during this procedure. A total of Versed 2.0 mg and Fentanyl 100 mcg was administered intravenously. Moderate Sedation Time: 36 minutes. The patient's level of consciousness and vital signs were monitored continuously by radiology nursing throughout the procedure under my direct supervision. FLUOROSCOPY TIME:  Fluoroscopy Time: 10 minutes 54 seconds (676 mGy) COMPLICATIONS: None PROCEDURE: Following a full explanation of the procedure along with the potentially associated complications, a witnessed informed consent was obtained. Specific risks that were discussed included bleeding, infection, injury to adjacent structures, neurologic injury, embolization of cement within the veins, failure of the procedure to improve pain, need for further procedure/ surgery, cardiopulmonary collapse, death. The patient understands the risks and wishes to proceed. The patient was placed prone on the fluoroscopic table. Nasal oxygen was administered. Physiologic monitoring was performed throughout the duration of the procedure. The skin overlying the L2 region was prepped and draped in the usual sterile fashion. The L2 vertebral body was identified and the right pedicle was infiltrated with 1% lidocaine. This was then followed by the advancement of Medtronic Osteocool Trocar needle through the right pedicle into the anterior one-third. The left pedicle was then infiltrated with 1% lidocaine. A small incision was made with 11 blade scalpel, and then a Medtronic Osteocool Trocar needle was advanced through the left pedicle into the anterior 1/3 of the vertebral body. The bone drill/measuring tool was then advanced through the bilateral pedicles to estimate length of the required Osteocool ablation device. 15 mm device selected for the right pedicle and 15 mm device selected for the left pedicle. Once position was confirmed, ablation was initiated for total of 11 minutes left, 11 minutes right. The RFA  ablation devices were then removed. Balloon cannula were advanced bilaterally. Simultaneous inflation of the left and right pedicular cannula balloons was performed under fluoroscopic observation. Methylmethacrylate mixture was then reconstituted. Under biplane intermittent fluoroscopy, the methylmethacrylate was then injected into the cavity of L2 vertebral body with filling of the space. No extravasation was noted posteriorly into the spinal canal. No epidural venous contamination was seen. The needles were then removed. Hemostasis was achieved at the skin entry site. There were no acute complications. Patient tolerated the procedure well. The patient was observed for 30 minutes and  returned to room in good condition. IMPRESSION: Status post treatment of symptomatic L2 pathologic fracture with RF ablation and vertebral augmentation with bipedicular kyphoplasty. Signed, Dulcy Fanny. Dellia Nims, RPVI [facility] Radiology Electronically Signed   By: Corrie Mckusick D.O.   On: 05/24/2018 12:34    Labs:  CBC: Recent Labs    05/06/18 1253 05/24/18 0955 05/27/18 0906 06/10/18 0848  WBC 6.8 5.8 5.5 6.4  HGB 11.8* 12.0 12.0 11.5*  HCT 37.2 38.6 38.0 36.5  PLT 197 162 190 153    COAGS: Recent Labs    05/24/18 0955  INR 1.11    BMP: Recent Labs    05/06/18 1253 05/24/18 0955 05/27/18 0906 06/10/18 0848  NA 138 139 142 140  K 3.3* 3.8 3.5 3.8  CL 107 110 109 109  CO2 22 22 24 23   GLUCOSE 94 91 101* 96  BUN 10 7 8 7   CALCIUM 8.8* 8.9 8.2* 8.0*  CREATININE 0.69 0.88 0.67 0.69  GFRNONAA >60 >60 >60 >60  GFRAA >60 >60 >60 >60    LIVER FUNCTION TESTS: Recent Labs    04/22/18 0920 05/06/18 1253 05/27/18 0906 06/10/18 0848  BILITOT 0.3 0.4 0.5 0.4  AST 16 16 16 16   ALT 9 11 10 11   ALKPHOS 81 85 91 71  PROT 6.4* 6.8 6.8 6.6  ALBUMIN 3.3* 3.6 3.6 3.5    TUMOR MARKERS: No results for input(s): AFPTM, CEA, CA199, CHROMGRNA in the last  8760 hours.  Assessment and Plan:  Ms Caccavale is a 58 yo female with known osseous metastases from colon carcinoma, now SP treatment of symptomatic pathologic fx of L2, with excellent relief of symptoms.   She is very pleased with the result, and has no new complaint.    She is comfortably performing all of her ADL's, with ECOG of 1.  She is most happy about her ability to sleep more comfortably.    I answered all of her questions.    I did explain that we would not schedule her for a follow up at this time.    We are happy to have assisted with her care.   Electronically Signed: Corrie Mckusick 06/18/2018, 8:37 AM   I spent a total of    15 Minutes in face to face in clinical consultation, greater than 50% of which was counseling/coordinating care for pathologic L2 compression fracture, SP RFA and kyphoplasty.

## 2018-06-24 ENCOUNTER — Inpatient Hospital Stay: Payer: BLUE CROSS/BLUE SHIELD

## 2018-06-24 ENCOUNTER — Inpatient Hospital Stay (HOSPITAL_BASED_OUTPATIENT_CLINIC_OR_DEPARTMENT_OTHER): Payer: BLUE CROSS/BLUE SHIELD | Admitting: Internal Medicine

## 2018-06-24 ENCOUNTER — Inpatient Hospital Stay: Payer: BLUE CROSS/BLUE SHIELD | Attending: Internal Medicine

## 2018-06-24 ENCOUNTER — Encounter: Payer: Self-pay | Admitting: Internal Medicine

## 2018-06-24 VITALS — BP 108/68 | HR 74 | Temp 97.4°F | Resp 16 | Wt 100.2 lb

## 2018-06-24 DIAGNOSIS — Z5112 Encounter for antineoplastic immunotherapy: Secondary | ICD-10-CM | POA: Diagnosis present

## 2018-06-24 DIAGNOSIS — Z79899 Other long term (current) drug therapy: Secondary | ICD-10-CM | POA: Insufficient documentation

## 2018-06-24 DIAGNOSIS — Z87891 Personal history of nicotine dependence: Secondary | ICD-10-CM | POA: Insufficient documentation

## 2018-06-24 DIAGNOSIS — C182 Malignant neoplasm of ascending colon: Secondary | ICD-10-CM | POA: Diagnosis present

## 2018-06-24 DIAGNOSIS — C772 Secondary and unspecified malignant neoplasm of intra-abdominal lymph nodes: Principal | ICD-10-CM

## 2018-06-24 DIAGNOSIS — R110 Nausea: Secondary | ICD-10-CM | POA: Insufficient documentation

## 2018-06-24 DIAGNOSIS — I10 Essential (primary) hypertension: Secondary | ICD-10-CM

## 2018-06-24 DIAGNOSIS — Z5111 Encounter for antineoplastic chemotherapy: Secondary | ICD-10-CM | POA: Diagnosis present

## 2018-06-24 DIAGNOSIS — R5383 Other fatigue: Secondary | ICD-10-CM

## 2018-06-24 DIAGNOSIS — Z23 Encounter for immunization: Secondary | ICD-10-CM | POA: Insufficient documentation

## 2018-06-24 LAB — URINALYSIS, COMPLETE (UACMP) WITH MICROSCOPIC
Bacteria, UA: NONE SEEN
Bilirubin Urine: NEGATIVE
Glucose, UA: NEGATIVE mg/dL
Hgb urine dipstick: NEGATIVE
Ketones, ur: NEGATIVE mg/dL
Leukocytes, UA: NEGATIVE
Nitrite: NEGATIVE
Protein, ur: NEGATIVE mg/dL
Specific Gravity, Urine: 1.012 (ref 1.005–1.030)
pH: 5 (ref 5.0–8.0)

## 2018-06-24 LAB — COMPREHENSIVE METABOLIC PANEL
ALK PHOS: 70 U/L (ref 38–126)
ALT: 10 U/L (ref 0–44)
AST: 12 U/L — ABNORMAL LOW (ref 15–41)
Albumin: 3.3 g/dL — ABNORMAL LOW (ref 3.5–5.0)
Anion gap: 4 — ABNORMAL LOW (ref 5–15)
BUN: 14 mg/dL (ref 6–20)
CO2: 21 mmol/L — ABNORMAL LOW (ref 22–32)
Calcium: 8.8 mg/dL — ABNORMAL LOW (ref 8.9–10.3)
Chloride: 111 mmol/L (ref 98–111)
Creatinine, Ser: 0.94 mg/dL (ref 0.44–1.00)
GFR calc Af Amer: >60 mL/min (ref >60)
GFR calc non Af Amer: >60 mL/min (ref >60)
Glucose, Bld: 84 mg/dL (ref 70–99)
Potassium: 3.9 mmol/L (ref 3.5–5.1)
Sodium: 136 mmol/L (ref 135–145)
TOTAL PROTEIN: 6.1 g/dL — AB (ref 6.5–8.1)
Total Bilirubin: 0.3 mg/dL (ref 0.3–1.2)

## 2018-06-24 LAB — CBC WITH DIFFERENTIAL/PLATELET
Abs Immature Granulocytes: 0.02 10*3/uL (ref 0.00–0.07)
Basophils Absolute: 0 10*3/uL (ref 0.0–0.1)
Basophils Relative: 1 %
EOS PCT: 2 %
Eosinophils Absolute: 0.2 10*3/uL (ref 0.0–0.5)
HCT: 34.6 % — ABNORMAL LOW (ref 36.0–46.0)
Hemoglobin: 11.1 g/dL — ABNORMAL LOW (ref 12.0–15.0)
Immature Granulocytes: 0 %
Lymphocytes Relative: 27 %
Lymphs Abs: 1.8 10*3/uL (ref 0.7–4.0)
MCH: 30.6 pg (ref 26.0–34.0)
MCHC: 32.1 g/dL (ref 30.0–36.0)
MCV: 95.3 fL (ref 80.0–100.0)
Monocytes Absolute: 0.7 10*3/uL (ref 0.1–1.0)
Monocytes Relative: 10 %
Neutro Abs: 4 10*3/uL (ref 1.7–7.7)
Neutrophils Relative %: 60 %
Platelets: 123 10*3/uL — ABNORMAL LOW (ref 150–400)
RBC: 3.63 MIL/uL — ABNORMAL LOW (ref 3.87–5.11)
RDW: 16.7 % — ABNORMAL HIGH (ref 11.5–15.5)
WBC: 6.6 10*3/uL (ref 4.0–10.5)
nRBC: 0 % (ref 0.0–0.2)

## 2018-06-24 MED ORDER — SODIUM CHLORIDE 0.9 % IV SOLN
Freq: Once | INTRAVENOUS | Status: AC
  Administered 2018-06-24: 10:00:00 via INTRAVENOUS
  Filled 2018-06-24: qty 250

## 2018-06-24 MED ORDER — SODIUM CHLORIDE 0.9% FLUSH
10.0000 mL | INTRAVENOUS | Status: DC | PRN
  Administered 2018-06-24: 10 mL
  Filled 2018-06-24: qty 10

## 2018-06-24 MED ORDER — INFLUENZA VAC SPLIT HIGH-DOSE 0.5 ML IM SUSY
0.5000 mL | PREFILLED_SYRINGE | Freq: Once | INTRAMUSCULAR | Status: AC
  Administered 2018-06-24: 0.5 mL via INTRAMUSCULAR
  Filled 2018-06-24: qty 0.5

## 2018-06-24 MED ORDER — SODIUM CHLORIDE 0.9 % IV SOLN
5.0000 mg/kg | INTRAVENOUS | Status: DC
  Administered 2018-06-24: 250 mg via INTRAVENOUS
  Filled 2018-06-24: qty 10

## 2018-06-24 MED ORDER — ONDANSETRON HCL 4 MG/2ML IJ SOLN
8.0000 mg | Freq: Once | INTRAMUSCULAR | Status: AC
  Administered 2018-06-24: 8 mg via INTRAVENOUS
  Filled 2018-06-24: qty 4

## 2018-06-24 MED ORDER — DEXAMETHASONE SODIUM PHOSPHATE 10 MG/ML IJ SOLN
10.0000 mg | Freq: Once | INTRAMUSCULAR | Status: AC
  Administered 2018-06-24: 10 mg via INTRAVENOUS
  Filled 2018-06-24: qty 1

## 2018-06-24 MED ORDER — SODIUM CHLORIDE 0.9 % IV SOLN
2400.0000 mg/m2 | INTRAVENOUS | Status: DC
  Administered 2018-06-24: 3500 mg via INTRAVENOUS
  Filled 2018-06-24: qty 70

## 2018-06-24 NOTE — Assessment & Plan Note (Addendum)
#   Colon cancer- cecal/ right-sided; STAGE IV; OCT 3rd 2019- CT- slight STABLE liver lesion; but MRI NOV 2019- Stable/improved liver lesion; increasing L-2 lesion; DEC 2019- MRI increasing lesion in L-2 [see below].  CEA -10-15. STABLE.    # proceed with 5FU infusion today; Labs today reviewed;  acceptable for treatment today.  Will repeat MRI liver in end of feb/starting of march.   #L2 metastases status post ablation [Jan third];improved; hydrocodone as needed.plan x-geva at next visit.   # Hypokalemia- stable. Continue potassium once a day.  #Peripheral neuropathy stable. New scripts given.   # DISPOSITION:  # Treatment today- 5FU+avatsin;  # Flu shot today. #  Follow up in  2 weeks; MD/ labs- cbc/cmp/cea/UA chemo-5FU pump-avastin;/ x-geva; pump off in 2 days- Dr.B

## 2018-06-24 NOTE — Progress Notes (Signed)
[place] OFFICE PROGRESS NOTE  Patient Care Team: Coral Spikes, DO as PCP - General (Family Medicine)  Cancer Staging No matching staging information was found for the patient.   Oncology History   # [facility] 2017- COLON CANCER- STAGE IV [s/p right hemi-colectomy; pT4apN2 (7/19LN)]; Pre-op CEA- [id number]  PET-  mesenteric adenopathy/mediastinal adenopathy/right-sided subclavicular lymph node.    April 17th -START FOLFOX; May 1st Add Avastin;   Aug 16th- Disc OX [sec PN]; con 5FU-Avastin; NOV 27th CT C/A/P- CR; except for 54m LUL; 5FU-Avastin q 3 W [Poor tolerance to FOLFOX].  # June/July 2019- Progression of Liver/Abd LN; July 2019- START FOLFIRI + Avastin; STOP iri 02/25/2018- sec to diarrhea; cont Avastin + 5FU  # March 2018- L2 uptake MET s/p RT [[facility]2018]; mid April X-geva  # L1 ablation/ostecool-  Jan 3rd 2020-    # MOLECULAR TESTING: K-ras-EXON 2- MUTATED; MSI- STABLE.   DIAGNOSIS: colon ca  STAGE:  IV ;GOALS: pallaitive  CURRENT/MOST RECENT THERAPY: 5FU+Avastin      Cancer of ascending colon metastatic to intra-abdominal lymph node (HCC)      INTERVAL HISTORY:  Nancy HALUSKA89y.o.  female pleasant patient above history of metastatic adenocarcinoma of the colon currently on 5-FU-avastin is here for follow-up.   Patient denies any nausea vomiting.  Appetite is good.  No weight loss.  Her back pain is improved; currently taking hydrocodone maybe 1 every other day.  No significant diarrhea.  No headaches.  No nosebleeds.  Review of Systems  Constitutional: Positive for malaise/fatigue. Negative for chills, diaphoresis, fever and weight loss.  HENT: Negative for nosebleeds and sore throat.   Eyes: Negative for double vision.  Respiratory: Negative for cough, hemoptysis, sputum production, shortness of breath and wheezing.   Cardiovascular: Negative for chest pain, palpitations, orthopnea and leg swelling.  Gastrointestinal: Negative for  abdominal pain, constipation, heartburn, melena, nausea and vomiting.  Genitourinary: Negative for dysuria, frequency and urgency.  Musculoskeletal: Positive for back pain. Negative for joint pain.  Skin: Negative.  Negative for itching and rash.  Neurological: Positive for tingling. Negative for dizziness, focal weakness, weakness and headaches.  Endo/Heme/Allergies: Does not bruise/bleed easily.  Psychiatric/Behavioral: Negative for depression. The patient is not nervous/anxious and does not have insomnia.       PAST MEDICAL HISTORY :  Past Medical History:  Diagnosis Date  . Chicken pox   . [place]    Partial colectomy 07/2015 + chemo tx's  . Hypertension   . Hypokalemia   . Menopause    > 5 yrs  . Peripheral neuropathy due to chemotherapy ([facility]     PAST SURGICAL HISTORY :   Past Surgical History:  Procedure Laterality Date  . BREAST BIOPSY Right    2007? pt unsure done by [facility]  . COLONOSCOPY    . COLONOSCOPY WITH PROPOFOL N/A 10/14/2015   Procedure: COLONOSCOPY WITH PROPOFOL;  Surgeon: PHulen Luster MD;  [facility];  Service: Endoscopy;  Laterality: N/A;  . COLOSTOMY REVISION Right 08/09/2015   Procedure: COLON RESECTION RIGHT;  Surgeon: RFlorene Glen MD;  Location: [facility] ORS;  Service: General;  Laterality: Right;  . ECTOPIC PREGNANCY SURGERY    . IR BONE TUMOR(S)RF ABLATION  05/24/2018  . IR KYPHO LUMBAR INC FX REDUCE BONE BX UNI/BIL CANNULATION INC/IMAGING  05/24/2018  . IR RADIOLOGIST EVAL & MGMT  05/08/2018  . IR RADIOLOGIST EVAL & MGMT  06/18/2018  . PORTACATH PLACEMENT N/A 09/01/2015  Procedure: INSERTION PORT-A-CATH;  Surgeon: Florene Glen, MD;  Location: [facility] ORS;  Service: General;  Laterality: N/A;  . SHOULDER SURGERY Left     FAMILY HISTORY :   Family History  Problem Relation Age of Onset  . Hypertension Mother   . Arthritis Father   . Prostate cancer Father   . Diabetes Maternal Grandmother   . Colon cancer Maternal Grandfather         colon    SOCIAL HISTORY:   Social History   Tobacco Use  . Smoking status: Former Smoker    Packs/day: 0.25    Years: 2.00    Pack years: 0.50  . Smokeless tobacco: Never Used  . Tobacco comment: recently quit  Substance Use Topics  . Alcohol use: No    Alcohol/week: 0.0 standard drinks  . Drug use: No    ALLERGIES:  has No Known Allergies.  MEDICATIONS:  Current Outpatient Medications  Medication Sig Dispense Refill  . acetaminophen (TYLENOL) 325 MG tablet Take 2 tablets (650 mg total) by mouth every 6 (six) hours as needed for mild pain (or Fever >/= 101).    . benazepril (LOTENSIN) 20 MG tablet TAKE 1 TABLET DAILY 90 tablet 4  . diphenoxylate-atropine (LOMOTIL) 2.5-0.025 MG tablet Take 1 tablet by mouth 4 (four) times daily as needed for diarrhea or loose stools. Take it along with immodium 40 tablet 4  . DULoxetine (CYMBALTA) 20 MG capsule Take 2 capsules (40 mg total) by mouth daily. 180 capsule 3  . feeding supplement (BOOST HIGH PROTEIN) LIQD Take 1 Container by mouth daily.    . ferrous sulfate (FERROUSUL) 325 (65 FE) MG tablet Take 1 tablet (325 mg total) by mouth daily with breakfast. 90 tablet 3  . gabapentin (NEURONTIN) 300 MG capsule One pill in am; and 2 pills at night prior to sleep. 90 capsule 3  . hydrochlorothiazide (MICROZIDE) 12.5 MG capsule TAKE 1 CAPSULE DAILY 30 capsule 6  . HYDROcodone-acetaminophen (NORCO/VICODIN) 5-325 MG tablet Take 1 tablet by mouth every 12 (twelve) hours as needed for moderate pain. 60 tablet 0  . lidocaine (LIDODERM) 5 % Place 1 patch onto the skin daily. Remove & Discard patch within 12 hours or as directed by MD 30 patch 2  . lidocaine-prilocaine (EMLA) cream Apply 1 application topically as needed. Apply generously over the Mediport 45 minutes prior to chemotherapy. 30 g 6  . loperamide (IMODIUM) 2 MG capsule Take 2 mg by mouth as needed for diarrhea or loose stools.    . Multiple Vitamins-Minerals (MULTIVITAMIN ADULT PO)  Take 1 tablet by mouth daily.    . ondansetron (ZOFRAN) 8 MG tablet One pill every 8 hours as needed for nausea/vomitting. 60 tablet 0  . potassium chloride SA (K-DUR,KLOR-CON) 20 MEQ tablet 1 pill twice a day 60 tablet 3  . prochlorperazine (COMPAZINE) 10 MG tablet Take 1 tablet (10 mg total) by mouth every 6 (six) hours as needed for nausea or vomiting. 90 tablet 3   No current facility-administered medications for this visit.    Facility-Administered Medications Ordered in Other Visits  Medication Dose Route Frequency Provider Last Rate Last Dose  . bevacizumab (AVASTIN) 250 mg in sodium chloride 0.9 % 100 mL chemo infusion  5 mg/kg (Treatment Plan Recorded) Intravenous Q14 Days Charlaine Dalton R, MD      . dexamethasone (DECADRON) injection 10 mg  10 mg Intravenous Once Charlaine Dalton R, MD      . fluorouracil (ADRUCIL) 3,500 mg in  sodium chloride 0.9 % 80 mL chemo infusion  2,400 mg/m2 (Treatment Plan Recorded) Intravenous 1 day or 1 dose Charlaine Dalton R, MD      . Influenza vac split quadrivalent PF (FLUZONE HIGH-DOSE) injection 0.5 mL  0.5 mL Intramuscular Once Charlaine Dalton R, MD      . ondansetron [facility]) injection 8 mg  8 mg Intravenous Once Charlaine Dalton R, MD      . sodium chloride flush (NS) 0.9 % injection 10 mL  10 mL Intravenous PRN Cammie Sickle, MD   10 mL at 10/04/15 0901  . sodium chloride flush (NS) 0.9 % injection 10 mL  10 mL Intravenous PRN Cammie Sickle, MD   10 mL at 07/02/17 1020  . sodium chloride flush (NS) 0.9 % injection 10 mL  10 mL Intracatheter PRN Cammie Sickle, MD   10 mL at 06/24/18 0903    PHYSICAL EXAMINATION: ECOG PERFORMANCE STATUS: 1 - Symptomatic but completely ambulatory  BP 108/68 (BP Location: Left Arm, Patient Position: Sitting, Cuff Size: Normal)   Pulse 74   Temp (!) 97.4 F (36.3 C) (Tympanic)   Resp 16   Wt 100 lb 3.2 oz (45.5 kg)   LMP  (LMP Unknown) Comment: LMP MORE THAN 5 YRS  BMI  19.57 kg/m   Filed Weights   06/24/18 0922  Weight: 100 lb 3.2 oz (45.5 kg)    Physical Exam  Constitutional: She is oriented to person, place, and time and well-developed, well-nourished, and in no distress.  She is accompanied by her mother.  She is walking herself.  HENT:  Head: Normocephalic and atraumatic.  Mouth/Throat: Oropharynx is clear and moist. No oropharyngeal exudate.  Eyes: Pupils are equal, round, and reactive to light.  Neck: Normal range of motion. Neck supple.  Cardiovascular: Normal rate and regular rhythm.  Pulmonary/Chest: No respiratory distress. She has no wheezes.  Abdominal: Soft. Bowel sounds are normal. She exhibits no distension and no mass. There is no abdominal tenderness. There is no rebound and no guarding.  Musculoskeletal: Normal range of motion.        General: No tenderness or edema.  Neurological: She is alert and oriented to person, place, and time.  Skin: Skin is warm.  Psychiatric: Affect normal.       LABORATORY DATA:  I have reviewed the data as listed    Component Value Date/Time   NA 136 06/24/2018 0858   K 3.9 06/24/2018 0858   CL 111 06/24/2018 0858   CO2 21 (L) 06/24/2018 0858   GLUCOSE 84 06/24/2018 0858   BUN 14 06/24/2018 0858   CREATININE 0.94 06/24/2018 0858   CALCIUM 8.8 (L) 06/24/2018 0858   PROT 6.1 (L) 06/24/2018 0858   ALBUMIN 3.3 (L) 06/24/2018 0858   AST 12 (L) 06/24/2018 0858   ALT 10 06/24/2018 0858   ALKPHOS 70 06/24/2018 0858   BILITOT 0.3 06/24/2018 0858   GFRNONAA >60 06/24/2018 0858   GFRAA >60 06/24/2018 0858    No results found for: SPEP, UPEP  Lab Results  Component Value Date   WBC 6.6 06/24/2018   NEUTROABS 4.0 06/24/2018   HGB 11.1 (L) 06/24/2018   HCT 34.6 (L) 06/24/2018   MCV 95.3 06/24/2018   PLT 123 (L) 06/24/2018      Chemistry      Component Value Date/Time   NA 136 06/24/2018 0858   K 3.9 06/24/2018 0858   CL 111 06/24/2018 0858   CO2 21 (L) 06/24/2018  0858   BUN 14  06/24/2018 0858   CREATININE 0.94 06/24/2018 0858      Component Value Date/Time   CALCIUM 8.8 (L) 06/24/2018 0858   ALKPHOS 70 06/24/2018 0858   AST 12 (L) 06/24/2018 0858   ALT 10 06/24/2018 0858   BILITOT 0.3 06/24/2018 0858       RADIOGRAPHIC STUDIES: I have personally reviewed the radiological images as listed and agreed with the findings in the report. No results found.   ASSESSMENT & PLAN:  Cancer of ascending colon metastatic to intra-abdominal lymph node ([place]) # Colon cancer- cecal/ right-sided; STAGE IV; OCT 3rd 2019- CT- slight STABLE liver lesion; but MRI NOV 2019- Stable/improved liver lesion; increasing L-2 lesion; DEC 2019- MRI increasing lesion in L-2 [see below].  CEA -10-15. STABLE.    # proceed with 5FU infusion today; Labs today reviewed;  acceptable for treatment today.  Will repeat MRI liver in end of feb/starting of march.   #L2 metastases status post ablation [Jan third];improved; hydrocodone as needed.plan x-geva at next visit.   # Hypokalemia- stable. Continue potassium once a day.  #Peripheral neuropathy stable. New scripts given.   # DISPOSITION:  # Treatment today- 5FU+avatsin;  # Flu shot today. #  Follow up in  2 weeks; MD/ labs- cbc/cmp/cea/UA chemo-5FU pump-avastin;/ x-geva; pump off in 2 days- Dr.B   No orders of the defined types were placed in this encounter.  All questions were answered. The patient knows to call the clinic with any problems, questions or concerns.      Cammie Sickle, MD 06/24/2018 10:04 AM

## 2018-06-24 NOTE — Addendum Note (Signed)
Addended by: Sandria Bales B on: 06/24/2018 10:18 AM   Modules accepted: Orders

## 2018-06-25 LAB — CEA: CEA: 10 ng/mL — ABNORMAL HIGH (ref 0.0–4.7)

## 2018-06-26 ENCOUNTER — Inpatient Hospital Stay: Payer: BLUE CROSS/BLUE SHIELD

## 2018-06-26 DIAGNOSIS — C182 Malignant neoplasm of ascending colon: Secondary | ICD-10-CM

## 2018-06-26 DIAGNOSIS — C772 Secondary and unspecified malignant neoplasm of intra-abdominal lymph nodes: Principal | ICD-10-CM

## 2018-06-26 DIAGNOSIS — Z5112 Encounter for antineoplastic immunotherapy: Secondary | ICD-10-CM | POA: Diagnosis not present

## 2018-06-26 MED ORDER — HEPARIN SOD (PORK) LOCK FLUSH 100 UNIT/ML IV SOLN
INTRAVENOUS | Status: AC
  Filled 2018-06-26: qty 5

## 2018-06-26 MED ORDER — HEPARIN SOD (PORK) LOCK FLUSH 100 UNIT/ML IV SOLN
500.0000 [IU] | Freq: Once | INTRAVENOUS | Status: AC | PRN
  Administered 2018-06-26: 500 [IU]

## 2018-06-26 MED ORDER — SODIUM CHLORIDE 0.9% FLUSH
10.0000 mL | INTRAVENOUS | Status: DC | PRN
  Administered 2018-06-26: 10 mL
  Filled 2018-06-26: qty 10

## 2018-07-08 ENCOUNTER — Inpatient Hospital Stay: Payer: BLUE CROSS/BLUE SHIELD

## 2018-07-08 ENCOUNTER — Encounter: Payer: Self-pay | Admitting: Internal Medicine

## 2018-07-08 ENCOUNTER — Inpatient Hospital Stay (HOSPITAL_BASED_OUTPATIENT_CLINIC_OR_DEPARTMENT_OTHER): Payer: BLUE CROSS/BLUE SHIELD | Admitting: Internal Medicine

## 2018-07-08 VITALS — BP 104/70 | HR 74 | Temp 98.6°F | Resp 16 | Wt 98.6 lb

## 2018-07-08 DIAGNOSIS — R110 Nausea: Secondary | ICD-10-CM | POA: Diagnosis not present

## 2018-07-08 DIAGNOSIS — C772 Secondary and unspecified malignant neoplasm of intra-abdominal lymph nodes: Secondary | ICD-10-CM | POA: Diagnosis not present

## 2018-07-08 DIAGNOSIS — R5383 Other fatigue: Secondary | ICD-10-CM

## 2018-07-08 DIAGNOSIS — Z79899 Other long term (current) drug therapy: Secondary | ICD-10-CM

## 2018-07-08 DIAGNOSIS — I10 Essential (primary) hypertension: Secondary | ICD-10-CM | POA: Diagnosis not present

## 2018-07-08 DIAGNOSIS — C182 Malignant neoplasm of ascending colon: Secondary | ICD-10-CM

## 2018-07-08 DIAGNOSIS — Z5112 Encounter for antineoplastic immunotherapy: Secondary | ICD-10-CM | POA: Diagnosis not present

## 2018-07-08 DIAGNOSIS — Z87891 Personal history of nicotine dependence: Secondary | ICD-10-CM

## 2018-07-08 DIAGNOSIS — C7951 Secondary malignant neoplasm of bone: Secondary | ICD-10-CM

## 2018-07-08 LAB — URINALYSIS, COMPLETE (UACMP) WITH MICROSCOPIC
Bilirubin Urine: NEGATIVE
Glucose, UA: NEGATIVE mg/dL
HGB URINE DIPSTICK: NEGATIVE
Ketones, ur: NEGATIVE mg/dL
Nitrite: NEGATIVE
Protein, ur: NEGATIVE mg/dL
Specific Gravity, Urine: 1.011 (ref 1.005–1.030)
Squamous Epithelial / HPF: NONE SEEN (ref 0–5)
pH: 5 (ref 5.0–8.0)

## 2018-07-08 LAB — CBC WITH DIFFERENTIAL/PLATELET
Abs Immature Granulocytes: 0.02 10*3/uL (ref 0.00–0.07)
BASOS ABS: 0.1 10*3/uL (ref 0.0–0.1)
Basophils Relative: 1 %
Eosinophils Absolute: 0.2 10*3/uL (ref 0.0–0.5)
Eosinophils Relative: 2 %
HCT: 35.3 % — ABNORMAL LOW (ref 36.0–46.0)
Hemoglobin: 11.6 g/dL — ABNORMAL LOW (ref 12.0–15.0)
Immature Granulocytes: 0 %
Lymphocytes Relative: 19 %
Lymphs Abs: 1.3 10*3/uL (ref 0.7–4.0)
MCH: 31.2 pg (ref 26.0–34.0)
MCHC: 32.9 g/dL (ref 30.0–36.0)
MCV: 94.9 fL (ref 80.0–100.0)
Monocytes Absolute: 0.7 10*3/uL (ref 0.1–1.0)
Monocytes Relative: 11 %
NRBC: 0 % (ref 0.0–0.2)
Neutro Abs: 4.4 10*3/uL (ref 1.7–7.7)
Neutrophils Relative %: 67 %
Platelets: 131 10*3/uL — ABNORMAL LOW (ref 150–400)
RBC: 3.72 MIL/uL — ABNORMAL LOW (ref 3.87–5.11)
RDW: 16.5 % — ABNORMAL HIGH (ref 11.5–15.5)
WBC: 6.6 10*3/uL (ref 4.0–10.5)

## 2018-07-08 LAB — COMPREHENSIVE METABOLIC PANEL
ALT: 10 U/L (ref 0–44)
AST: 14 U/L — ABNORMAL LOW (ref 15–41)
Albumin: 3.4 g/dL — ABNORMAL LOW (ref 3.5–5.0)
Alkaline Phosphatase: 57 U/L (ref 38–126)
Anion gap: 8 (ref 5–15)
BUN: 14 mg/dL (ref 6–20)
CO2: 23 mmol/L (ref 22–32)
Calcium: 8.8 mg/dL — ABNORMAL LOW (ref 8.9–10.3)
Chloride: 105 mmol/L (ref 98–111)
Creatinine, Ser: 0.74 mg/dL (ref 0.44–1.00)
GFR calc Af Amer: >60 mL/min (ref >60)
GFR calc non Af Amer: >60 mL/min (ref >60)
Glucose, Bld: 91 mg/dL (ref 70–99)
POTASSIUM: 3.5 mmol/L (ref 3.5–5.1)
Sodium: 136 mmol/L (ref 135–145)
Total Bilirubin: 0.3 mg/dL (ref 0.3–1.2)
Total Protein: 6.4 g/dL — ABNORMAL LOW (ref 6.5–8.1)

## 2018-07-08 MED ORDER — DENOSUMAB 120 MG/1.7ML ~~LOC~~ SOLN
120.0000 mg | Freq: Once | SUBCUTANEOUS | Status: AC
  Administered 2018-07-08: 120 mg via SUBCUTANEOUS
  Filled 2018-07-08: qty 1.7

## 2018-07-08 MED ORDER — DEXAMETHASONE SODIUM PHOSPHATE 10 MG/ML IJ SOLN
10.0000 mg | Freq: Once | INTRAMUSCULAR | Status: AC
  Administered 2018-07-08: 10 mg via INTRAVENOUS
  Filled 2018-07-08: qty 1

## 2018-07-08 MED ORDER — SODIUM CHLORIDE 0.9 % IV SOLN
2400.0000 mg/m2 | INTRAVENOUS | Status: DC
  Administered 2018-07-08: 3500 mg via INTRAVENOUS
  Filled 2018-07-08: qty 70

## 2018-07-08 MED ORDER — SODIUM CHLORIDE 0.9% FLUSH
10.0000 mL | INTRAVENOUS | Status: DC | PRN
  Administered 2018-07-08: 10 mL
  Filled 2018-07-08: qty 10

## 2018-07-08 MED ORDER — SODIUM CHLORIDE 0.9 % IV SOLN
Freq: Once | INTRAVENOUS | Status: AC
  Administered 2018-07-08: 10:00:00 via INTRAVENOUS
  Filled 2018-07-08: qty 250

## 2018-07-08 MED ORDER — ONDANSETRON HCL 4 MG/2ML IJ SOLN
8.0000 mg | Freq: Once | INTRAMUSCULAR | Status: AC
  Administered 2018-07-08: 8 mg via INTRAVENOUS
  Filled 2018-07-08: qty 4

## 2018-07-08 MED ORDER — SODIUM CHLORIDE 0.9 % IV SOLN
5.0000 mg/kg | INTRAVENOUS | Status: DC
  Administered 2018-07-08: 250 mg via INTRAVENOUS
  Filled 2018-07-08: qty 10

## 2018-07-08 NOTE — Assessment & Plan Note (Addendum)
#   Colon cancer- cecal/ right-sided; STAGE IV; OCT 3rd 2019- CT- slight STABLE liver lesion; but MRI NOV 2019- Stable/improved liver lesion; increasing L-2 lesion; DEC 2019- MRI increasing lesion in L-2 [see below].  CEA -10-15. STABLE.    # proceed with 5FU infusion today; Labs today reviewed;  acceptable for treatment today.  Will order MRI today.  #L2 metastases status post ablation [Jan third];improved; hydrocodone as needed. X-geva today.   # Nausea- chronic- Zofran 8 mg every 6-8 hours.   # Hypokalemia- stable. Continue potassium once a day.  # Peripheral neuropathy stable. New scripts given.   # DISPOSITION:  # Treatment today- 5FU+avatsin; X-geva #  Follow up in  2 weeks; MD/ labs- cbc/cmp/cea/UA chemo-5FU pump-avastin; MRI liver.  pump off in 2 days- Dr.B

## 2018-07-08 NOTE — Progress Notes (Signed)
[place] OFFICE PROGRESS NOTE  Patient Care Team: Coral Spikes, DO as PCP - General (Family Medicine)  Cancer Staging No matching staging information was found for the patient.   Oncology History   # [facility] 2017- COLON CANCER- STAGE IV [s/p right hemi-colectomy; pT4apN2 (7/19LN)]; Pre-op CEA- [id number]  PET-  mesenteric adenopathy/mediastinal adenopathy/right-sided subclavicular lymph node.    April 17th -START FOLFOX; May 1st Add Avastin;   Aug 16th- Disc OX [sec PN]; con 5FU-Avastin; NOV 27th CT C/A/P- CR; except for 51m LUL; 5FU-Avastin q 3 W [Poor tolerance to FOLFOX].  # June/July 2019- Progression of Liver/Abd LN; July 2019- START FOLFIRI + Avastin; STOP iri 02/25/2018- sec to diarrhea; cont Avastin + 5FU  # March 2018- L2 uptake MET s/p RT [[facility]2018]; mid April X-geva  # L1 ablation/ostecool-  Jan 3rd 2020-    # MOLECULAR TESTING: K-ras-EXON 2- MUTATED; MSI- STABLE.   DIAGNOSIS: colon ca  STAGE:  IV ;GOALS: pallaitive  CURRENT/MOST RECENT THERAPY: 5FU+Avastin      Cancer of ascending colon metastatic to intra-abdominal lymph node (HCC)      INTERVAL HISTORY:  Nancy SCHOEPPNER89y.o.  female pleasant patient above history of metastatic adenocarcinoma of the colon currently on 5-FU-avastin is here for follow-up.   Complains of mild to moderate fatigue.  Back pain is improved.  Complains of nausea almost every day need to take Zofran every 6-8 hours.  No vomiting.  Weight stable.  No diarrhea.  No headaches or nosebleeds.  Review of Systems  Constitutional: Positive for malaise/fatigue. Negative for chills, diaphoresis, fever and weight loss.  HENT: Negative for nosebleeds and sore throat.   Eyes: Negative for double vision.  Respiratory: Negative for cough, hemoptysis, sputum production, shortness of breath and wheezing.   Cardiovascular: Negative for chest pain, palpitations, orthopnea and leg swelling.  Gastrointestinal: Negative for  abdominal pain, constipation, heartburn, melena, nausea and vomiting.  Genitourinary: Negative for dysuria, frequency and urgency.  Musculoskeletal: Positive for back pain. Negative for joint pain.  Skin: Negative.  Negative for itching and rash.  Neurological: Positive for tingling. Negative for dizziness, focal weakness, weakness and headaches.  Endo/Heme/Allergies: Does not bruise/bleed easily.  Psychiatric/Behavioral: Negative for depression. The patient is not nervous/anxious and does not have insomnia.       PAST MEDICAL HISTORY :  Past Medical History:  Diagnosis Date  . Chicken pox   . [place]    Partial colectomy 07/2015 + chemo tx's  . Hypertension   . Hypokalemia   . Menopause    > 5 yrs  . Peripheral neuropathy due to chemotherapy ([facility]     PAST SURGICAL HISTORY :   Past Surgical History:  Procedure Laterality Date  . BREAST BIOPSY Right    2007? pt unsure done by [facility]  . COLONOSCOPY    . COLONOSCOPY WITH PROPOFOL N/A 10/14/2015   Procedure: COLONOSCOPY WITH PROPOFOL;  Surgeon: PHulen Luster MD;  [facility];  Service: Endoscopy;  Laterality: N/A;  . COLOSTOMY REVISION Right 08/09/2015   Procedure: COLON RESECTION RIGHT;  Surgeon: RFlorene Glen MD;  Location: [facility] ORS;  Service: General;  Laterality: Right;  . ECTOPIC PREGNANCY SURGERY    . IR BONE TUMOR(S)RF ABLATION  05/24/2018  . IR KYPHO LUMBAR INC FX REDUCE BONE BX UNI/BIL CANNULATION INC/IMAGING  05/24/2018  . IR RADIOLOGIST EVAL & MGMT  05/08/2018  . IR RADIOLOGIST EVAL & MGMT  06/18/2018  . PORTACATH PLACEMENT N/A  09/01/2015   Procedure: INSERTION PORT-A-CATH;  Surgeon: Florene Glen, MD;  Location: [facility] ORS;  Service: General;  Laterality: N/A;  . SHOULDER SURGERY Left     FAMILY HISTORY :   Family History  Problem Relation Age of Onset  . Hypertension Mother   . Arthritis Father   . Prostate cancer Father   . Diabetes Maternal Grandmother   . Colon cancer Maternal Grandfather         colon    SOCIAL HISTORY:   Social History   Tobacco Use  . Smoking status: Former Smoker    Packs/day: 0.25    Years: 2.00    Pack years: 0.50  . Smokeless tobacco: Never Used  . Tobacco comment: recently quit  Substance Use Topics  . Alcohol use: No    Alcohol/week: 0.0 standard drinks  . Drug use: No    ALLERGIES:  has No Known Allergies.  MEDICATIONS:  Current Outpatient Medications  Medication Sig Dispense Refill  . acetaminophen (TYLENOL) 325 MG tablet Take 2 tablets (650 mg total) by mouth every 6 (six) hours as needed for mild pain (or Fever >/= 101).    . benazepril (LOTENSIN) 20 MG tablet TAKE 1 TABLET DAILY 90 tablet 4  . diphenoxylate-atropine (LOMOTIL) 2.5-0.025 MG tablet Take 1 tablet by mouth 4 (four) times daily as needed for diarrhea or loose stools. Take it along with immodium 40 tablet 4  . DULoxetine (CYMBALTA) 20 MG capsule Take 2 capsules (40 mg total) by mouth daily. 180 capsule 3  . feeding supplement (BOOST HIGH PROTEIN) LIQD Take 1 Container by mouth daily.    . ferrous sulfate (FERROUSUL) 325 (65 FE) MG tablet Take 1 tablet (325 mg total) by mouth daily with breakfast. 90 tablet 3  . gabapentin (NEURONTIN) 300 MG capsule One pill in am; and 2 pills at night prior to sleep. 90 capsule 3  . hydrochlorothiazide (MICROZIDE) 12.5 MG capsule TAKE 1 CAPSULE DAILY 30 capsule 6  . HYDROcodone-acetaminophen (NORCO/VICODIN) 5-325 MG tablet Take 1 tablet by mouth every 12 (twelve) hours as needed for moderate pain. 60 tablet 0  . lidocaine (LIDODERM) 5 % Place 1 patch onto the skin daily. Remove & Discard patch within 12 hours or as directed by MD 30 patch 2  . lidocaine-prilocaine (EMLA) cream Apply 1 application topically as needed. Apply generously over the Mediport 45 minutes prior to chemotherapy. 30 g 6  . loperamide (IMODIUM) 2 MG capsule Take 2 mg by mouth as needed for diarrhea or loose stools.    . Multiple Vitamins-Minerals (MULTIVITAMIN ADULT PO)  Take 1 tablet by mouth daily.    . ondansetron (ZOFRAN) 8 MG tablet One pill every 8 hours as needed for nausea/vomitting. 60 tablet 0  . potassium chloride SA (K-DUR,KLOR-CON) 20 MEQ tablet 1 pill twice a day 60 tablet 3  . prochlorperazine (COMPAZINE) 10 MG tablet Take 1 tablet (10 mg total) by mouth every 6 (six) hours as needed for nausea or vomiting. 90 tablet 3   No current facility-administered medications for this visit.    Facility-Administered Medications Ordered in Other Visits  Medication Dose Route Frequency Provider Last Rate Last Dose  . bevacizumab (AVASTIN) 250 mg in sodium chloride 0.9 % 100 mL chemo infusion  5 mg/kg (Treatment Plan Recorded) Intravenous Q14 Days Charlaine Dalton R, MD      . denosumab (XGEVA) injection 120 mg  120 mg Subcutaneous Once Cammie Sickle, MD      . fluorouracil (ADRUCIL)  3,500 mg in sodium chloride 0.9 % 80 mL chemo infusion  2,400 mg/m2 (Treatment Plan Recorded) Intravenous 1 day or 1 dose Charlaine Dalton R, MD      . sodium chloride flush (NS) 0.9 % injection 10 mL  10 mL Intravenous PRN Cammie Sickle, MD   10 mL at 10/04/15 0901  . sodium chloride flush (NS) 0.9 % injection 10 mL  10 mL Intravenous PRN Cammie Sickle, MD   10 mL at 07/02/17 1020  . sodium chloride flush (NS) 0.9 % injection 10 mL  10 mL Intracatheter PRN Cammie Sickle, MD   10 mL at 07/08/18 0930    PHYSICAL EXAMINATION: ECOG PERFORMANCE STATUS: 1 - Symptomatic but completely ambulatory  BP 104/70 (BP Location: Left Arm, Patient Position: Sitting, Cuff Size: Normal)   Pulse 74   Temp 98.6 F (37 C) (Tympanic)   Resp 16   Wt 98 lb 9.6 oz (44.7 kg)   LMP  (LMP Unknown) Comment: LMP MORE THAN 5 YRS  BMI 19.26 kg/m   Filed Weights   07/08/18 0947  Weight: 98 lb 9.6 oz (44.7 kg)    Physical Exam  Constitutional: She is oriented to person, place, and time and well-developed, well-nourished, and in no distress.  She is accompanied  by her mother.  She is walking herself.  HENT:  Head: Normocephalic and atraumatic.  Mouth/Throat: Oropharynx is clear and moist. No oropharyngeal exudate.  Eyes: Pupils are equal, round, and reactive to light.  Neck: Normal range of motion. Neck supple.  Cardiovascular: Normal rate and regular rhythm.  Pulmonary/Chest: No respiratory distress. She has no wheezes.  Abdominal: Soft. Bowel sounds are normal. She exhibits no distension and no mass. There is no abdominal tenderness. There is no rebound and no guarding.  Musculoskeletal: Normal range of motion.        General: No tenderness or edema.  Neurological: She is alert and oriented to person, place, and time.  Skin: Skin is warm.  Psychiatric: Affect normal.       LABORATORY DATA:  I have reviewed the data as listed    Component Value Date/Time   NA 136 07/08/2018 0920   K 3.5 07/08/2018 0920   CL 105 07/08/2018 0920   CO2 23 07/08/2018 0920   GLUCOSE 91 07/08/2018 0920   BUN 14 07/08/2018 0920   CREATININE 0.74 07/08/2018 0920   CALCIUM 8.8 (L) 07/08/2018 0920   PROT 6.4 (L) 07/08/2018 0920   ALBUMIN 3.4 (L) 07/08/2018 0920   AST 14 (L) 07/08/2018 0920   ALT 10 07/08/2018 0920   ALKPHOS 57 07/08/2018 0920   BILITOT 0.3 07/08/2018 0920   GFRNONAA >60 07/08/2018 0920   GFRAA >60 07/08/2018 0920    No results found for: SPEP, UPEP  Lab Results  Component Value Date   WBC 6.6 07/08/2018   NEUTROABS 4.4 07/08/2018   HGB 11.6 (L) 07/08/2018   HCT 35.3 (L) 07/08/2018   MCV 94.9 07/08/2018   PLT 131 (L) 07/08/2018      Chemistry      Component Value Date/Time   NA 136 07/08/2018 0920   K 3.5 07/08/2018 0920   CL 105 07/08/2018 0920   CO2 23 07/08/2018 0920   BUN 14 07/08/2018 0920   CREATININE 0.74 07/08/2018 0920      Component Value Date/Time   CALCIUM 8.8 (L) 07/08/2018 0920   ALKPHOS 57 07/08/2018 0920   AST 14 (L) 07/08/2018 0920   ALT 10  07/08/2018 0920   BILITOT 0.3 07/08/2018 0920        RADIOGRAPHIC STUDIES: I have personally reviewed the radiological images as listed and agreed with the findings in the report. No results found.   ASSESSMENT & PLAN:  Cancer of ascending colon metastatic to intra-abdominal lymph node ([place]) # Colon cancer- cecal/ right-sided; STAGE IV; OCT 3rd 2019- CT- slight STABLE liver lesion; but MRI NOV 2019- Stable/improved liver lesion; increasing L-2 lesion; DEC 2019- MRI increasing lesion in L-2 [see below].  CEA -10-15. STABLE.    # proceed with 5FU infusion today; Labs today reviewed;  acceptable for treatment today.  Will order MRI today.  #L2 metastases status post ablation [Jan third];improved; hydrocodone as needed. X-geva today.   # Nausea- chronic- Zofran 8 mg every 6-8 hours.   # Hypokalemia- stable. Continue potassium once a day.  # Peripheral neuropathy stable. New scripts given.   # DISPOSITION:  # Treatment today- 5FU+avatsin; X-geva #  Follow up in  2 weeks; MD/ labs- cbc/cmp/cea/UA chemo-5FU pump-avastin; MRI liver.  pump off in 2 days- Dr.B   Orders Placed This Encounter  Procedures  . MR LIVER W WO CONTRAST    Standing Status:   Future    Standing Expiration Date:   09/06/2019    Order Specific Question:   ** REASON FOR EXAM (FREE TEXT)    Answer:   colon cancer with liver mets    Order Specific Question:   If indicated for the ordered procedure, I authorize the administration of contrast media per Radiology protocol    Answer:   Yes    Order Specific Question:   What is the patient's sedation requirement?    Answer:   No Sedation    Order Specific Question:   Does the patient have a pacemaker or implanted devices?    Answer:   No    Order Specific Question:   Radiology Contrast Protocol - do NOT remove file path    Answer:   [email or web address]    Order Specific Question:   Preferred imaging location?    Answer:   [facility] (table limit-400lbs)   All questions were answered.  The patient knows to call the clinic with any problems, questions or concerns.      Cammie Sickle, MD 07/08/2018 10:59 AM

## 2018-07-09 LAB — CEA: CEA: 11.4 ng/mL — ABNORMAL HIGH (ref 0.0–4.7)

## 2018-07-10 ENCOUNTER — Inpatient Hospital Stay: Payer: BLUE CROSS/BLUE SHIELD

## 2018-07-10 DIAGNOSIS — C772 Secondary and unspecified malignant neoplasm of intra-abdominal lymph nodes: Principal | ICD-10-CM

## 2018-07-10 DIAGNOSIS — C182 Malignant neoplasm of ascending colon: Secondary | ICD-10-CM

## 2018-07-10 DIAGNOSIS — Z5112 Encounter for antineoplastic immunotherapy: Secondary | ICD-10-CM | POA: Diagnosis not present

## 2018-07-10 MED ORDER — SODIUM CHLORIDE 0.9% FLUSH
10.0000 mL | INTRAVENOUS | Status: DC | PRN
  Administered 2018-07-10: 10 mL
  Filled 2018-07-10: qty 10

## 2018-07-10 MED ORDER — HEPARIN SOD (PORK) LOCK FLUSH 100 UNIT/ML IV SOLN
500.0000 [IU] | Freq: Once | INTRAVENOUS | Status: AC | PRN
  Administered 2018-07-10: 500 [IU]

## 2018-07-10 MED ORDER — HEPARIN SOD (PORK) LOCK FLUSH 100 UNIT/ML IV SOLN
INTRAVENOUS | Status: AC
  Filled 2018-07-10: qty 5

## 2018-07-18 ENCOUNTER — Ambulatory Visit
Admission: RE | Admit: 2018-07-18 | Discharge: 2018-07-18 | Disposition: A | Discharge status: Home / Self Care | Payer: BLUE CROSS/BLUE SHIELD | Source: Ambulatory Visit | Attending: Internal Medicine | Admitting: Internal Medicine

## 2018-07-18 DIAGNOSIS — C182 Malignant neoplasm of ascending colon: Secondary | ICD-10-CM | POA: Diagnosis present

## 2018-07-18 DIAGNOSIS — C772 Secondary and unspecified malignant neoplasm of intra-abdominal lymph nodes: Secondary | ICD-10-CM | POA: Insufficient documentation

## 2018-07-18 MED ORDER — GADOBUTROL 1 MMOL/ML IV SOLN
4.0000 mL | Freq: Once | INTRAVENOUS | Status: AC | PRN
  Administered 2018-07-18: 4 mL via INTRAVENOUS

## 2018-07-22 ENCOUNTER — Inpatient Hospital Stay: Payer: BLUE CROSS/BLUE SHIELD | Attending: Internal Medicine

## 2018-07-22 ENCOUNTER — Other Ambulatory Visit: Payer: Self-pay

## 2018-07-22 ENCOUNTER — Encounter: Payer: Self-pay | Admitting: Internal Medicine

## 2018-07-22 ENCOUNTER — Other Ambulatory Visit: Payer: Self-pay | Admitting: Internal Medicine

## 2018-07-22 ENCOUNTER — Inpatient Hospital Stay: Payer: BLUE CROSS/BLUE SHIELD

## 2018-07-22 ENCOUNTER — Inpatient Hospital Stay (HOSPITAL_BASED_OUTPATIENT_CLINIC_OR_DEPARTMENT_OTHER): Payer: BLUE CROSS/BLUE SHIELD | Admitting: Internal Medicine

## 2018-07-22 VITALS — BP 122/80 | HR 67 | Temp 97.6°F | Resp 18 | Ht 60.0 in | Wt 99.0 lb

## 2018-07-22 DIAGNOSIS — I10 Essential (primary) hypertension: Secondary | ICD-10-CM | POA: Insufficient documentation

## 2018-07-22 DIAGNOSIS — E876 Hypokalemia: Secondary | ICD-10-CM | POA: Insufficient documentation

## 2018-07-22 DIAGNOSIS — G8929 Other chronic pain: Secondary | ICD-10-CM | POA: Diagnosis not present

## 2018-07-22 DIAGNOSIS — C7951 Secondary malignant neoplasm of bone: Secondary | ICD-10-CM

## 2018-07-22 DIAGNOSIS — G620 Drug-induced polyneuropathy: Secondary | ICD-10-CM | POA: Insufficient documentation

## 2018-07-22 DIAGNOSIS — C787 Secondary malignant neoplasm of liver and intrahepatic bile duct: Secondary | ICD-10-CM | POA: Diagnosis not present

## 2018-07-22 DIAGNOSIS — Z87891 Personal history of nicotine dependence: Secondary | ICD-10-CM | POA: Insufficient documentation

## 2018-07-22 DIAGNOSIS — R110 Nausea: Secondary | ICD-10-CM

## 2018-07-22 DIAGNOSIS — M549 Dorsalgia, unspecified: Secondary | ICD-10-CM | POA: Diagnosis not present

## 2018-07-22 DIAGNOSIS — C182 Malignant neoplasm of ascending colon: Secondary | ICD-10-CM

## 2018-07-22 DIAGNOSIS — Z5112 Encounter for antineoplastic immunotherapy: Secondary | ICD-10-CM | POA: Insufficient documentation

## 2018-07-22 DIAGNOSIS — R197 Diarrhea, unspecified: Secondary | ICD-10-CM | POA: Diagnosis not present

## 2018-07-22 DIAGNOSIS — C772 Secondary and unspecified malignant neoplasm of intra-abdominal lymph nodes: Secondary | ICD-10-CM | POA: Diagnosis not present

## 2018-07-22 DIAGNOSIS — Z5111 Encounter for antineoplastic chemotherapy: Secondary | ICD-10-CM | POA: Insufficient documentation

## 2018-07-22 DIAGNOSIS — Z79899 Other long term (current) drug therapy: Secondary | ICD-10-CM | POA: Insufficient documentation

## 2018-07-22 LAB — CBC WITH DIFFERENTIAL/PLATELET
Abs Immature Granulocytes: 0.02 10*3/uL (ref 0.00–0.07)
BASOS ABS: 0.1 10*3/uL (ref 0.0–0.1)
Basophils Relative: 1 %
Eosinophils Absolute: 0.2 10*3/uL (ref 0.0–0.5)
Eosinophils Relative: 2 %
HCT: 33.5 % — ABNORMAL LOW (ref 36.0–46.0)
Hemoglobin: 10.8 g/dL — ABNORMAL LOW (ref 12.0–15.0)
Immature Granulocytes: 0 %
Lymphocytes Relative: 22 %
Lymphs Abs: 1.4 10*3/uL (ref 0.7–4.0)
MCH: 30.6 pg (ref 26.0–34.0)
MCHC: 32.2 g/dL (ref 30.0–36.0)
MCV: 94.9 fL (ref 80.0–100.0)
Monocytes Absolute: 0.7 10*3/uL (ref 0.1–1.0)
Monocytes Relative: 11 %
NEUTROS ABS: 4.1 10*3/uL (ref 1.7–7.7)
Neutrophils Relative %: 64 %
PLATELETS: 123 10*3/uL — AB (ref 150–400)
RBC: 3.53 MIL/uL — ABNORMAL LOW (ref 3.87–5.11)
RDW: 16.7 % — ABNORMAL HIGH (ref 11.5–15.5)
WBC: 6.6 10*3/uL (ref 4.0–10.5)
nRBC: 0 % (ref 0.0–0.2)

## 2018-07-22 LAB — URINALYSIS, COMPLETE (UACMP) WITH MICROSCOPIC
Bacteria, UA: NONE SEEN
Bilirubin Urine: NEGATIVE
GLUCOSE, UA: NEGATIVE mg/dL
HGB URINE DIPSTICK: NEGATIVE
Ketones, ur: NEGATIVE mg/dL
Nitrite: NEGATIVE
Protein, ur: NEGATIVE mg/dL
Specific Gravity, Urine: 1.018 (ref 1.005–1.030)
pH: 5 (ref 5.0–8.0)

## 2018-07-22 LAB — COMPREHENSIVE METABOLIC PANEL
ALBUMIN: 3.2 g/dL — AB (ref 3.5–5.0)
ALT: 11 U/L (ref 0–44)
AST: 14 U/L — ABNORMAL LOW (ref 15–41)
Alkaline Phosphatase: 45 U/L (ref 38–126)
Anion gap: 8 (ref 5–15)
BILIRUBIN TOTAL: 0.5 mg/dL (ref 0.3–1.2)
BUN: 10 mg/dL (ref 6–20)
CO2: 23 mmol/L (ref 22–32)
Calcium: 7.8 mg/dL — ABNORMAL LOW (ref 8.9–10.3)
Chloride: 105 mmol/L (ref 98–111)
Creatinine, Ser: 0.9 mg/dL (ref 0.44–1.00)
GFR calc Af Amer: >60 mL/min (ref >60)
GFR calc non Af Amer: >60 mL/min (ref >60)
Glucose, Bld: 93 mg/dL (ref 70–99)
Potassium: 2.9 mmol/L — ABNORMAL LOW (ref 3.5–5.1)
SODIUM: 136 mmol/L (ref 135–145)
Total Protein: 6.1 g/dL — ABNORMAL LOW (ref 6.5–8.1)

## 2018-07-22 MED ORDER — ONDANSETRON HCL 4 MG/2ML IJ SOLN
8.0000 mg | Freq: Once | INTRAMUSCULAR | Status: AC
  Administered 2018-07-22: 8 mg via INTRAVENOUS
  Filled 2018-07-22: qty 4

## 2018-07-22 MED ORDER — DEXAMETHASONE SODIUM PHOSPHATE 10 MG/ML IJ SOLN
10.0000 mg | Freq: Once | INTRAMUSCULAR | Status: AC
  Administered 2018-07-22: 10 mg via INTRAVENOUS
  Filled 2018-07-22: qty 1

## 2018-07-22 MED ORDER — HYDROCHLOROTHIAZIDE 12.5 MG PO CAPS: 12.5000 mg | ORAL_CAPSULE | Freq: Every day | ORAL | 6 refills | Status: DC

## 2018-07-22 MED ORDER — POTASSIUM CHLORIDE 20 MEQ/100ML IV SOLN
20.0000 meq | Freq: Once | INTRAVENOUS | Status: AC
  Administered 2018-07-22: 20 meq via INTRAVENOUS

## 2018-07-22 MED ORDER — SODIUM CHLORIDE 0.9 % IV SOLN
5.0000 mg/kg | INTRAVENOUS | Status: DC
  Administered 2018-07-22: 250 mg via INTRAVENOUS
  Filled 2018-07-22: qty 10

## 2018-07-22 MED ORDER — SODIUM CHLORIDE 0.9 % IV SOLN
Freq: Once | INTRAVENOUS | Status: AC
  Administered 2018-07-22: 11:00:00 via INTRAVENOUS
  Filled 2018-07-22: qty 250

## 2018-07-22 MED ORDER — HEPARIN SOD (PORK) LOCK FLUSH 100 UNIT/ML IV SOLN: 500.0000 [IU] | Freq: Once | INTRAVENOUS | Status: DC

## 2018-07-22 MED ORDER — HYDROCODONE-ACETAMINOPHEN 5-325 MG PO TABS: 1.0000 | ORAL_TABLET | Freq: Two times a day (BID) | ORAL | 0 refills | Status: DC | PRN

## 2018-07-22 MED ORDER — SODIUM CHLORIDE 0.9% FLUSH
10.0000 mL | Freq: Once | INTRAVENOUS | Status: AC
  Administered 2018-07-22: 10 mL via INTRAVENOUS
  Filled 2018-07-22: qty 10

## 2018-07-22 MED ORDER — SODIUM CHLORIDE 0.9 % IV SOLN
2000.0000 mg/m2 | INTRAVENOUS | Status: DC
  Administered 2018-07-22: 2900 mg via INTRAVENOUS
  Filled 2018-07-22: qty 58

## 2018-07-22 NOTE — Assessment & Plan Note (Addendum)
#  Colon cancer- cecal/ right-sided; STAGE IV; OCT 3rd 2019- CT- slight STABLE liver lesion; but MRI NOV 2019- Stable/improved liver lesion; increasing L-2 lesion; Feb 2020- MRI increasing lesion in liver ~1.8 cm [see below].  CEA -10-15. Stable.   # proceed with 5FU infusion+ avatsin today; Labs today reviewed;  acceptable for treatment today; cut dose of 5FU to 2000 mg  # Liver met- increasing- recommend ablation; will discuss in tumor conference.   # diarrhea- G-1-2; continue lomotil/ imodium; cut dose of 5FU 2000 mg/m/46 hours.Marland Kitchen   #L2 metastases status post ablation [Jan third];improved; hydrocodone as needed. X-geva today.   # Nausea- chronic- Zofran 8 mg every 6-8 hours.   # Hypokalemia- worsened/ sec to diarrhea [see above-decrease dose of 5-FU]Continue potassium twice a day.  20 of IV KCl today.  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 New scripts given.   # DISPOSITION:  # Treatment today- 5FU+avastin; add Kcl 20 IV today # referral to IR re: Liver met- ablation #  Follow up in  2 weeks; MD/ labs- cbc/cmp/cea/UA chemo-5FU pump-avastin;pump off in 2 days- Dr.B

## 2018-07-22 NOTE — Progress Notes (Signed)
[facility] OFFICE PROGRESS NOTE  Patient Care Team: Cook, Jayce G, DO as PCP - General (Family Medicine)  Cancer Staging No matching staging information was found for the patient.   Oncology History   # FEB-MARCH 2017- COLON CANCER- STAGE IV [s/p right hemi-colectomy; pT4apN2 (7/19LN)]; Pre-op CEA- 7.April2017-  PET-  mesenteric adenopathy/mediastinal adenopathy/right-sided subclavicular lymph node.    April 17th -START FOLFOX; May 1st Add Avastin;   Aug 16th- Disc OX [sec PN]; con 5FU-Avastin; NOV 27th CT C/A/P- CR; except for 4mm LUL; 5FU-Avastin q 3 W [Poor tolerance to FOLFOX].  # June/July 2019- Progression of Liver/Abd LN; July 2019- START FOLFIRI + Avastin; STOP iri 02/25/2018- sec to diarrhea; cont Avastin + 5FU  # March 2018- L2 uptake MET s/p RT [May 2018]; mid April X-geva  # L1 ablation/ostecool-  Jan 3rd 2020-   # march 2nd 2020- decrease 5FU LET US 5 CUV  to 2000mg/m2/   # MOLECULAR TESTING: K-ras-EXON 2- MUTATED; MSI- STABLE.   DIAGNOSIS: colon ca  STAGE:  IV ;GOALS: pallaitive  CURRENT/MOST RECENT THERAPY: 5FU+Avastin      Cancer of ascending colon metastatic to intra-abdominal lymph node (HCC)      INTERVAL HISTORY:  Nancy Clark 57 y.o.  female pleasant patient above history of metastatic adenocarcinoma of the colon currently on 5-FU-avastin is here for follow-up/review results of MRI.  Patient continues to have mild fatigue.  Patient complains to have mild to moderate diarrhea up to 2-3 loose stools a day.  Taking Imodium/Lomotil every day.  Chronic mild nausea.  Appetite is good.  No significant weight loss.  Chronic back pain for which she is taking hydrocodone 2 pills a day.   Review of Systems  Constitutional: Positive for malaise/fatigue. Negative for chills, diaphoresis, fever and weight loss.  HENT: Negative for nosebleeds and sore throat.   Eyes: Negative for double vision.  Respiratory: Negative for cough, hemoptysis,  sputum production, shortness of breath and wheezing.   Cardiovascular: Negative for chest pain, palpitations, orthopnea and leg swelling.  Gastrointestinal: Positive for diarrhea. Negative for abdominal pain, constipation, heartburn, melena, nausea and vomiting.  Genitourinary: Negative for dysuria, frequency and urgency.  Musculoskeletal: Positive for back pain. Negative for joint pain.  Skin: Negative.  Negative for itching and rash.  Neurological: Positive for tingling. Negative for dizziness, focal weakness, weakness and headaches.  Endo/Heme/Allergies: Does not bruise/bleed easily.  Psychiatric/Behavioral: Negative for depression. The patient is not nervous/anxious and does not have insomnia.       PAST MEDICAL HISTORY :  Past Medical History:  Diagnosis Date  . Chicken pox   . Colon cancer (HCC)    Partial colectomy 07/2015 + chemo tx's  . Hypertension   . Hypokalemia   . Menopause    > 5 yrs  . Peripheral neuropathy due to chemotherapy (HCC)     PAST SURGICAL HISTORY :   Past Surgical History:  Procedure Laterality Date  . BREAST BIOPSY Right    2007? pt unsure done by [facility]  . COLONOSCOPY    . COLONOSCOPY WITH PROPOFOL N/A 10/14/2015   Procedure: COLONOSCOPY WITH PROPOFOL;  Surgeon: Paul Y Oh, MD;  Location: [facility] END[facility]COPY;  Service: Endoscopy;  Laterality: N/A;  . COLOSTOMY REVISION Right 08/09/2015   Procedure: COLON RESECTION RIGHT;  Surgeon: Richard E Cooper, MD;  Location: [facility] ORS;  Service: General;  Laterality: Right;  . ECTOPIC PREGNANCY SURGERY    . IR BONE TUMOR(S)RF ABLATION  05/24/2018  . IR   KYPHO LUMBAR INC FX REDUCE BONE BX UNI/BIL CANNULATION INC/IMAGING  05/24/2018  . IR RADIOLOGIST EVAL & MGMT  05/08/2018  . IR RADIOLOGIST EVAL & MGMT  06/18/2018  . PORTACATH PLACEMENT N/A 09/01/2015   Procedure: INSERTION PORT-A-CATH;  Surgeon: Florene Glen, MD;  Location: [facility] ORS;  Service: General;  Laterality: N/A;  . SHOULDER SURGERY Left     FAMILY HISTORY :    Family History  Problem Relation Age of Onset  . Hypertension Mother   . Arthritis Father   . Prostate cancer Father   . Diabetes Maternal Grandmother   . Colon cancer Maternal Grandfather        colon    SOCIAL HISTORY:   Social History   Tobacco Use  . Smoking status: Former Smoker    Packs/day: 0.25    Years: 2.00    Pack years: 0.50  . Smokeless tobacco: Never Used  . Tobacco comment: recently quit  Substance Use Topics  . Alcohol use: No    Alcohol/week: 0.0 standard drinks  . Drug use: No    ALLERGIES:  has No Known Allergies.  MEDICATIONS:  Current Outpatient Medications  Medication Sig Dispense Refill  . acetaminophen (TYLENOL) 325 MG tablet Take 2 tablets (650 mg total) by mouth every 6 (six) hours as needed for mild pain (or Fever >/= 101).    . benazepril (LOTENSIN) 20 MG tablet TAKE 1 TABLET DAILY 90 tablet 4  . diphenoxylate-atropine (LOMOTIL) 2.5-0.025 MG tablet Take 1 tablet by mouth 4 (four) times daily as needed for diarrhea or loose stools. Take it along with immodium 40 tablet 4  . DULoxetine (CYMBALTA) 20 MG capsule Take 2 capsules (40 mg total) by mouth daily. 180 capsule 3  . feeding supplement (BOOST HIGH PROTEIN) LIQD Take 1 Container by mouth daily.    . ferrous sulfate (FERROUSUL) 325 (65 FE) MG tablet Take 1 tablet (325 mg total) by mouth daily with breakfast. 90 tablet 3  . gabapentin (NEURONTIN) 300 MG capsule One pill in am; and 2 pills at night prior to sleep. 90 capsule 3  . hydrochlorothiazide (MICROZIDE) 12.5 MG capsule Take 1 capsule (12.5 mg total) by mouth daily. 30 capsule 6  . HYDROcodone-acetaminophen (NORCO/VICODIN) 5-325 MG tablet Take 1 tablet by mouth every 12 (twelve) hours as needed for moderate pain. 60 tablet 0  . lidocaine-prilocaine (EMLA) cream Apply 1 application topically as needed. Apply generously over the Mediport 45 minutes prior to chemotherapy. 30 g 6  . loperamide (IMODIUM) 2 MG capsule Take 2 mg by mouth as needed  for diarrhea or loose stools.    . Multiple Vitamins-Minerals (MULTIVITAMIN ADULT PO) Take 1 tablet by mouth daily.    . ondansetron (ZOFRAN) 8 MG tablet One pill every 8 hours as needed for nausea/vomitting. 60 tablet 0  . potassium chloride SA (K-DUR,KLOR-CON) 20 MEQ tablet 1 pill twice a day 60 tablet 3  . prochlorperazine (COMPAZINE) 10 MG tablet Take 1 tablet (10 mg total) by mouth every 6 (six) hours as needed for nausea or vomiting. 90 tablet 3  . lidocaine (LIDODERM) 5 % Place 1 patch onto the skin daily. Remove & Discard patch within 12 hours or as directed by MD (Patient not taking: Reported on 07/22/2018) 30 patch 2   No current facility-administered medications for this visit.    Facility-Administered Medications Ordered in Other Visits  Medication Dose Route Frequency Provider Last Rate Last Dose  . heparin lock flush 100 unit/mL  500 Units  Intravenous Once Charlaine Dalton R, MD      . sodium chloride flush (NS) 0.9 % injection 10 mL  10 mL Intravenous PRN Cammie Sickle, MD   10 mL at 10/04/15 0901  . sodium chloride flush (NS) 0.9 % injection 10 mL  10 mL Intravenous PRN Cammie Sickle, MD   10 mL at 07/02/17 1020    PHYSICAL EXAMINATION: ECOG PERFORMANCE STATUS: 1 - Symptomatic but completely ambulatory  BP 122/80 (BP Location: Left Arm, Patient Position: Sitting)   Pulse 67   Temp 97.6 F (36.4 C) (Tympanic)   Resp 18   Ht 5' (1.524 m)   Wt 99 lb (44.9 kg)   LMP  (LMP Unknown) Comment: LMP MORE THAN 5 YRS  BMI 19.33 kg/m   Filed Weights   07/22/18 0925  Weight: 99 lb (44.9 kg)    Physical Exam  Constitutional: She is oriented to person, place, and time and well-developed, well-nourished, and in no distress.  She is accompanied by her mother.  She is walking herself.  HENT:  Head: Normocephalic and atraumatic.  Mouth/Throat: Oropharynx is clear and moist. No oropharyngeal exudate.  Eyes: Pupils are equal, round, and reactive to light.  Neck:  Normal range of motion. Neck supple.  Cardiovascular: Normal rate and regular rhythm.  Pulmonary/Chest: No respiratory distress. She has no wheezes.  Abdominal: Soft. Bowel sounds are normal. She exhibits no distension and no mass. There is no abdominal tenderness. There is no rebound and no guarding.  Musculoskeletal: Normal range of motion.        General: No tenderness or edema.  Neurological: She is alert and oriented to person, place, and time.  Skin: Skin is warm.  Psychiatric: Affect normal.       LABORATORY DATA:  I have reviewed the data as listed    Component Value Date/Time   NA 136 07/22/2018 0911   K 2.9 (L) 07/22/2018 0911   CL 105 07/22/2018 0911   CO2 23 07/22/2018 0911   GLUCOSE 93 07/22/2018 0911   BUN 10 07/22/2018 0911   CREATININE 0.90 07/22/2018 0911   CALCIUM 7.8 (L) 07/22/2018 0911   PROT 6.1 (L) 07/22/2018 0911   ALBUMIN 3.2 (L) 07/22/2018 0911   AST 14 (L) 07/22/2018 0911   ALT 11 07/22/2018 0911   ALKPHOS 45 07/22/2018 0911   BILITOT 0.5 07/22/2018 0911   GFRNONAA >60 07/22/2018 0911   GFRAA >60 07/22/2018 0911    No results found for: SPEP, UPEP  Lab Results  Component Value Date   WBC 6.6 07/22/2018   NEUTROABS 4.1 07/22/2018   HGB 10.8 (L) 07/22/2018   HCT 33.5 (L) 07/22/2018   MCV 94.9 07/22/2018   PLT 123 (L) 07/22/2018      Chemistry      Component Value Date/Time   NA 136 07/22/2018 0911   K 2.9 (L) 07/22/2018 0911   CL 105 07/22/2018 0911   CO2 23 07/22/2018 0911   BUN 10 07/22/2018 0911   CREATININE 0.90 07/22/2018 0911      Component Value Date/Time   CALCIUM 7.8 (L) 07/22/2018 0911   ALKPHOS 45 07/22/2018 0911   AST 14 (L) 07/22/2018 0911   ALT 11 07/22/2018 0911   BILITOT 0.5 07/22/2018 0911       RADIOGRAPHIC STUDIES: I have personally reviewed the radiological images as listed and agreed with the findings in the report. No results found.   ASSESSMENT & PLAN:  Cancer of ascending colon metastatic  to  intra-abdominal lymph node [facility]) # Colon cancer- cecal/ right-sided; STAGE IV; OCT 3rd 2019- CT- slight STABLE liver lesion; but MRI NOV 2019- Stable/improved liver lesion; increasing L-2 lesion; Feb 2020- MRI increasing lesion in liver ~1.8 cm [see below].  CEA -10-15. Stable.   # proceed with 5FU infusion+ avatsin today; Labs today reviewed;  acceptable for treatment today; cut dose of 5FU to 2000 mg  # Liver met- increasing- recommend ablation; will discuss in tumor conference.   # diarrhea- G-1-2; continue lomotil/ imodium; cut dose of 5FU 2000 mg/m/46 hours.Marland Kitchen   #L2 metastases status post ablation [Jan third];improved; hydrocodone as needed. X-geva today.   # Nausea- chronic- Zofran 8 mg every 6-8 hours.   # Hypokalemia- worsened/ sec to diarrhea [see above-decrease dose of 5-FU]Continue potassium twice a day.  20 of IV KCl today.  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 New scripts given.   # DISPOSITION:  # Treatment today- 5FU+avastin; add Kcl 20 IV today # referral to IR re: Liver met- ablation #  Follow up in  2 weeks; MD/ labs- cbc/cmp/cea/UA chemo-5FU pump-avastin;pump off in 2 days- Dr.B   Orders Placed This Encounter  Procedures  . Ambulatory referral to Interventional Radiology    Referral Priority:   Routine    Referral Type:   Consultation    Referral Reason:   Specialty Services Required    Referred to Provider:   Arne Cleveland, MD    Requested Specialty:   Interventional Radiology    Number of Visits Requested:   1   All questions were answered. The patient knows to call the clinic with any problems, questions or concerns.      Cammie Sickle, MD 07/22/2018 10:16 AM

## 2018-07-23 ENCOUNTER — Other Ambulatory Visit: Payer: Self-pay | Admitting: Internal Medicine

## 2018-07-23 LAB — CEA: CEA: 11.3 ng/mL — ABNORMAL HIGH (ref 0.0–4.7)

## 2018-07-23 NOTE — Progress Notes (Signed)
mdt  

## 2018-07-24 ENCOUNTER — Inpatient Hospital Stay: Payer: BLUE CROSS/BLUE SHIELD

## 2018-07-24 DIAGNOSIS — C772 Secondary and unspecified malignant neoplasm of intra-abdominal lymph nodes: Principal | ICD-10-CM

## 2018-07-24 DIAGNOSIS — C182 Malignant neoplasm of ascending colon: Secondary | ICD-10-CM

## 2018-07-24 DIAGNOSIS — Z5112 Encounter for antineoplastic immunotherapy: Secondary | ICD-10-CM | POA: Diagnosis not present

## 2018-07-24 MED ORDER — HEPARIN SOD (PORK) LOCK FLUSH 100 UNIT/ML IV SOLN
500.0000 [IU] | Freq: Once | INTRAVENOUS | Status: AC | PRN
  Administered 2018-07-24: 500 [IU]
  Filled 2018-07-24: qty 5

## 2018-07-24 MED ORDER — SODIUM CHLORIDE 0.9% FLUSH
10.0000 mL | INTRAVENOUS | Status: DC | PRN
  Administered 2018-07-24: 10 mL
  Filled 2018-07-24: qty 10

## 2018-07-25 ENCOUNTER — Other Ambulatory Visit: Payer: BLUE CROSS/BLUE SHIELD

## 2018-07-25 NOTE — Progress Notes (Signed)
Tumor Board Documentation  Nancy Clark was presented by Dr Grayland Ormond at our Tumor Board on 07/25/2018, which included representatives from medical oncology, radiation oncology, radiology, pathology, surgical, navigation, internal medicine, pulmonology, research.  Nancy Clark currently presents as a current patient, for discussion, for new tumor(s) with history of the following treatments: surgical intervention(s), adjuvant chemotherapy, active survellience.  Additionally, we reviewed previous medical and familial history, history of present illness, and recent lab results along with all available histopathologic and imaging studies. The tumor board considered available treatment options and made the following recommendations:   Refer to Interventional Radiology for Ablation  The following procedures/referrals were also placed: No orders of the defined types were placed in this encounter.   Clinical Trial Status: not discussed   Staging used: AJCC Stage Group AJCC Staging: T: pT4 N: pN2   Group: Stage 4  National site-specific guidelines NCCN were discussed with respect to the case.  Tumor board is a meeting of clinicians from various specialty areas who evaluate and discuss patients for whom a multidisciplinary approach is being considered. Final determinations in the plan of care are those of the provider(s). The responsibility for follow up of recommendations given during tumor board is that of the provider.   Today's extended care, comprehensive team conference, Nancy Clark was not present for the discussion and was not examined.   Multidisciplinary Tumor Board is a multidisciplinary case peer review process.  Decisions discussed in the Multidisciplinary Tumor Board reflect the opinions of the specialists present at the conference without having examined the patient.  Ultimately, treatment and diagnostic decisions rest with the primary provider(s) and the patient.

## 2018-07-26 ENCOUNTER — Other Ambulatory Visit: Payer: Self-pay | Admitting: Internal Medicine

## 2018-07-26 DIAGNOSIS — C189 Malignant neoplasm of colon, unspecified: Secondary | ICD-10-CM

## 2018-07-26 DIAGNOSIS — C787 Secondary malignant neoplasm of liver and intrahepatic bile duct: Principal | ICD-10-CM

## 2018-07-31 ENCOUNTER — Encounter: Payer: Self-pay | Admitting: Radiology

## 2018-07-31 ENCOUNTER — Ambulatory Visit
Admission: RE | Admit: 2018-07-31 | Discharge: 2018-07-31 | Disposition: A | Discharge status: Home / Self Care | Payer: BLUE CROSS/BLUE SHIELD | Source: Ambulatory Visit | Attending: Internal Medicine | Admitting: Internal Medicine

## 2018-07-31 ENCOUNTER — Other Ambulatory Visit: Payer: Self-pay

## 2018-07-31 DIAGNOSIS — C189 Malignant neoplasm of colon, unspecified: Secondary | ICD-10-CM

## 2018-07-31 DIAGNOSIS — C787 Secondary malignant neoplasm of liver and intrahepatic bile duct: Principal | ICD-10-CM

## 2018-07-31 HISTORY — PX: IR RADIOLOGIST EVAL & MGMT: IMG5224

## 2018-07-31 NOTE — Progress Notes (Signed)
Chief Complaint: Metastatic Colon Cancer  Referring Physician(s): Brahmanday,Govinda R  History of Present Illness: Nancy Clark is a 58 y.o. female presenting today as a scheduled consultation to [place] [facility], kindly referred by Dr. Rogue Bussing, for evaluation of oligometastatic disease of colon cancer, and candidacy for possible treatment of liver lesion.   Nancy Clark is here today with her husband for today's appointment.  We have met her previously in December, when she was referred to [place] to discuss and ultimately treat a symptomatic pathologic fracture of L2.  We performed Osteocool RFA and KP of the site 05/24/2018 with bipedicular approach.  She is very satisfied after the treatment, and is symptom free today.    The diagnosis was made February of 2017, with her first clinic visit with Dr. Rogue Bussing 07/20/2015.  Right colectomy was performed for the right sided CA on 08/09/2015 by Dr. Burt Knack.    Surgical Path: Adenocarcinoma KRAS mutation positive.   PT4apN2.   Dr. Aletha Halim notes indicate her initial therapy was FOLFOX, with avastin, with initial augmentation in august, as poor tolerance.  5FU was then continued with avastin in November 2017.  Summer of 2019 started FOLFIRI and avastin, stopped in October.  Then continued 5FU and avastin.    Recently her case was discussed at tumor board, given that she has evidence of growth of a <2cm lesion in the right liver.   ________________________________________ Tumor Board Documentation  Nancy Clark was presented by Dr Grayland Ormond at our Tumor Board on 07/25/2018, which included representatives from medical oncology, radiation oncology, radiology, pathology, surgical, navigation, internal medicine, pulmonology, research.  Sadee currently presents as a current patient, for discussion, for new tumor(s) with history of the following treatments: surgical intervention(s), adjuvant chemotherapy, active survellience.   Additionally, we reviewed previous medical and familial history, history of present illness, and recent lab results along with all available histopathologic and imaging studies. The tumor board considered available treatment options and made the following recommendations: Refer to Interventional Radiology for Ablation  The following procedures/referrals were also placed: No orders of the defined types were placed in this encounter.   Clinical Trial Status: not discussed   Staging used: AJCC Stage Group AJCC Staging: T: pT4 N: pN2 Group: Stage 4  National site-specific guidelines NCCN were discussed with respect to the case.  __________________________________________________  Today, Nancy Maffeo tells me that she feels very well, and is up all day doing all of her ADL's independently.  My impression is ECOG of 0.   She denies any abdominal pain, GI symptoms, GU symptoms, constitutional symptoms.  She denies any back pain, and is quite satisified with her result from the [place] of L2.    MRI imaging of the abdomen performed 07/18/2018 shows only 1 liver lesion, with slight interval growth from comparison studies.    Past Medical History:  Diagnosis Date  . Chicken pox   . Colon cancer ([place])    Partial colectomy 07/2015 + chemo tx's  . Hypertension   . Hypokalemia   . Menopause    > 5 yrs  . Peripheral neuropathy due to chemotherapy [facility])     Past Surgical History:  Procedure Laterality Date  . BREAST BIOPSY Right    2007? pt unsure done by [facility]  . COLONOSCOPY    . COLONOSCOPY WITH PROPOFOL N/A 10/14/2015   Procedure: COLONOSCOPY WITH PROPOFOL;  Surgeon: Hulen Luster, MD;  Location: [facility] ENDOSCOPY;  Service: Endoscopy;  Laterality: N/A;  . COLOSTOMY REVISION Right  08/09/2015   Procedure: COLON RESECTION RIGHT;  Surgeon: Florene Glen, MD;  Location: [facility] ORS;  Service: General;  Laterality: Right;  . ECTOPIC PREGNANCY SURGERY    . IR BONE TUMOR(S)RF ABLATION  05/24/2018  .  IR KYPHO LUMBAR INC FX REDUCE BONE BX UNI/BIL CANNULATION INC/IMAGING  05/24/2018  . IR RADIOLOGIST EVAL & MGMT  05/08/2018  . IR RADIOLOGIST EVAL & MGMT  06/18/2018  . PORTACATH PLACEMENT N/A 09/01/2015   Procedure: INSERTION PORT-A-CATH;  Surgeon: Florene Glen, MD;  Location: [facility] ORS;  Service: General;  Laterality: N/A;  . SHOULDER SURGERY Left     Allergies: Patient has no known allergies.  Medications: Prior to Admission medications   Medication Sig Start Date End Date Taking? Authorizing Provider  acetaminophen (TYLENOL) 325 MG tablet Take 2 tablets (650 mg total) by mouth every 6 (six) hours as needed for mild pain (or Fever >/= 101). 10/14/15   Nicholes Mango, MD  benazepril (LOTENSIN) 20 MG tablet TAKE 1 TABLET DAILY 04/05/18   Cammie Sickle, MD  diphenoxylate-atropine (LOMOTIL) 2.5-0.025 MG tablet Take 1 tablet by mouth 4 (four) times daily as needed for diarrhea or loose stools. Take it along with immodium 12/17/17   Cammie Sickle, MD  DULoxetine (CYMBALTA) 20 MG capsule Take 2 capsules (40 mg total) by mouth daily. 06/10/18   Cammie Sickle, MD  feeding supplement (BOOST HIGH PROTEIN) LIQD Take 1 Container by mouth daily.    [provider]  ferrous sulfate (FERROUSUL) 325 (65 FE) MG tablet Take 1 tablet (325 mg total) by mouth daily with breakfast. 10/14/15   Gouru, Illene Silver, MD  gabapentin (NEURONTIN) 300 MG capsule One pill in am; and 2 pills at night prior to sleep. 06/10/18   Cammie Sickle, MD  hydrochlorothiazide (MICROZIDE) 12.5 MG capsule Take 1 capsule (12.5 mg total) by mouth daily. 07/22/18   Cammie Sickle, MD  HYDROcodone-acetaminophen (NORCO/VICODIN) 5-325 MG tablet Take 1 tablet by mouth every 12 (twelve) hours as needed for moderate pain. 07/22/18   Cammie Sickle, MD  lidocaine (LIDODERM) 5 % Place 1 patch onto the skin daily. Remove & Discard patch within 12 hours or as directed by MD Patient not taking: Reported on  07/22/2018 02/11/18   Cammie Sickle, MD  lidocaine-prilocaine (EMLA) cream Apply 1 application topically as needed. Apply generously over the Mediport 45 minutes prior to chemotherapy. 05/27/18   Cammie Sickle, MD  loperamide (IMODIUM) 2 MG capsule Take 2 mg by mouth as needed for diarrhea or loose stools.    [provider]  Multiple Vitamins-Minerals (MULTIVITAMIN ADULT PO) Take 1 tablet by mouth daily.    [provider]  ondansetron (ZOFRAN) 8 MG tablet One pill every 8 hours as needed for nausea/vomitting. 03/25/18   Cammie Sickle, MD  potassium chloride SA (K-DUR,KLOR-CON) 20 MEQ tablet 1 pill twice a day 12/17/17   Cammie Sickle, MD  prochlorperazine (COMPAZINE) 10 MG tablet Take 1 tablet (10 mg total) by mouth every 6 (six) hours as needed for nausea or vomiting. 05/27/18   Cammie Sickle, MD     Family History  Problem Relation Age of Onset  . Hypertension Mother   . Arthritis Father   . Prostate cancer Father   . Diabetes Maternal Grandmother   . Colon cancer Maternal Grandfather        colon    Social History   Socioeconomic History  . Marital status: Married  Spouse name: Not on file  . Number of children: Not on file  . Years of education: Not on file  . Highest education level: Not on file  Occupational History  . Not on file  Social Needs  . Financial resource strain: Not on file  . Food insecurity:    Worry: Not on file    Inability: Not on file  . Transportation needs:    Medical: Not on file    Non-medical: Not on file  Tobacco Use  . Smoking status: Former Smoker    Packs/day: 0.25    Years: 2.00    Pack years: 0.50  . Smokeless tobacco: Never Used  . Tobacco comment: recently quit  Substance and Sexual Activity  . Alcohol use: No    Alcohol/week: 0.0 standard drinks  . Drug use: No  . Sexual activity: Yes  Lifestyle  . Physical activity:    Days per week: Not on file    Minutes per session: Not  on file  . Stress: Not on file  Relationships  . Social connections:    Talks on phone: Not on file    Gets together: Not on file    Attends religious service: Not on file    Active member of club or organization: Not on file    Attends meetings of clubs or organizations: Not on file    Relationship status: Not on file  Other Topics Concern  . Not on file  Social History Narrative  . Not on file    ECOG Status: 0 - Asymptomatic  Review of Systems: A 12 point ROS discussed and pertinent positives are indicated in the HPI above.  All other systems are negative.  Review of Systems  Vital Signs: BP 125/70   Pulse 63   Temp 98 F (36.7 C) (Oral)   Resp 14   Ht 5' (1.524 m)   Wt 45.4 kg   LMP  (LMP Unknown) Comment: LMP MORE THAN 5 YRS  SpO2 100%   BMI 19.53 kg/m   Physical Exam General: 58 yo female appearing stated age.  Well-developed, well-nourished.  No distress. HEENT: Atraumatic, normocephalic.  Conjugate gaze, extra-ocular motor intact. No scleral icterus or scleral injection. No lesions on external ears, nose, lips, or gums.  Oral mucosa moist, pink.  Neck: Symmetric with no goiter enlargement.  Chest/Lungs:  Symmetric chest with inspiration/expiration.  No labored breathing.  Clear to auscultation with no wheezes, rhonchi, or rales.  Heart:  RRR, with no third heart sounds appreciated. No JVD appreciated.  Abdomen:  Soft, NT/ND, with + bowel sounds.   Genito-urinary: Deferred Neurologic: Alert & Oriented to person, place, and time.   Normal affect and insight.  Appropriate questions.  Moving all 4 extremities with gross sensory intact.    Mallampati Score:  Imaging: Mr Liver W Wo Contrast  Result Date: 07/18/2018 CLINICAL DATA:  Follow-up metastatic colon carcinoma. EXAM: MRI ABDOMEN WITHOUT AND WITH CONTRAST TECHNIQUE: Multiplanar multisequence MR imaging of the abdomen was performed both before and after the administration of intravenous contrast. CONTRAST:  4  mL Gadavist COMPARISON:  03/28/2018 FINDINGS: Lower chest: No acute findings. Hepatobiliary: Subcentimeter cysts are again seen in the right and left lobes. A sub-cm flash-filling hemangioma is seen in segment 8, also stable. A heterogeneously enhancing mass is seen in segment 8 which measures 1.8 x 1.5 cm on image 25/14. This shows mild increase in size compared to 1.0 x 0.8 cm previously, consistent with liver metastasis. No other  hepatic masses are identified. Gallbladder is unremarkable. No evidence of biliary ductal dilatation. Pancreas:  No mass or inflammatory changes. Spleen:  Within normal limits in size and appearance. Adrenals/Urinary Tract: No masses identified. No evidence of hydronephrosis. Stomach/Bowel: Visualized portion unremarkable. Vascular/Lymphatic: No pathologically enlarged lymph nodes identified. No abdominal aortic aneurysm. Other:  L2 vertebral body bone metastasis is again noted. Musculoskeletal:  No suspicious bone lesions identified. IMPRESSION: Mild increase in size of 1.8 cm liver mass in segment 8, suspicious for liver metastasis. No other progressive or new sites of metastatic disease identified. L2 bone metastasis again noted. Stable tiny hepatic cysts and benign hemangioma. Electronically Signed   By: Earle Gell M.D.   On: 07/18/2018 14:39    Labs:  CBC: Recent Labs    06/10/18 0848 06/24/18 0858 07/08/18 0920 07/22/18 0911  WBC 6.4 6.6 6.6 6.6  HGB 11.5* 11.1* 11.6* 10.8*  HCT 36.5 34.6* 35.3* 33.5*  PLT 153 123* 131* 123*    COAGS: Recent Labs    05/24/18 0955  INR 1.11    BMP: Recent Labs    06/10/18 0848 06/24/18 0858 07/08/18 0920 07/22/18 0911  NA 140 136 136 136  K 3.8 3.9 3.5 2.9*  CL 109 111 105 105  CO2 23 21* 23 23  GLUCOSE 96 84 91 93  BUN 7 14 14 10   CALCIUM 8.0* 8.8* 8.8* 7.8*  CREATININE 0.69 0.94 0.74 0.90  GFRNONAA >60 >60 >60 >60  GFRAA >60 >60 >60 >60    LIVER FUNCTION TESTS: Recent Labs    06/10/18 0848 06/24/18  0858 07/08/18 0920 07/22/18 0911  BILITOT 0.4 0.3 0.3 0.5  AST 16 12* 14* 14*  ALT 11 10 10 11   ALKPHOS 71 70 57 45  PROT 6.6 6.1* 6.4* 6.1*  ALBUMIN 3.5 3.3* 3.4* 3.2*    TUMOR MARKERS: No results for input(s): AFPTM, CEA, CA199, CHROMGRNA in the last 8760 hours.  Assessment and Plan:  Nancy Debruler is a very pleasant 58 year old female with a history of metastatic colon adenocarcinoma, with oligometastatic disease of the liver.   She has a single lesion that is <2cm of the right liver, which has demonstrated growth over time.  Though no pathology of the lesion has been performed, it seems most likely to be a metastasis from her known cancer.   She has been recently treated for a symptomatic pathologic compression fracture of L2 with Osteocool, and is now symptomatic, with ECOG of 0.    She continues on maintenance chemotherapy.    I had a lengthy discussion with her regarding the principles behind treating oligometastatic disease of the liver with colon cancer.  Generally, patients have limited longevity with mets secondary to the liver becoming overwhelmed, and that by reducing the burden of disease we stand to improve overall survival.  Given the location of the lesion, she is likely not a surgical candidate, as was decided at tumor board.    I was specific that we are offering palliation only with ablation, but can sometimes provide durable result with overall survival, especially when the lesions are treated early.   Regarding risks/benefit, specific risks discussed included: bleeding, infection, injury to adjacent structures, need for further surgery/procedure, abscess, hospitalization, risk of anesthesia, cardiopulmonary collapse, death.    After our discussion, she would like to proceed with CT guided ablation.   Plan: - plan to proceed with CT guided tissue ablation with microwave, at [facility], for single right liver metastasis of known colon  cancer, with Dr. Earleen Newport - I have  advised her to observe all of her other follow up appointments.   Thank you for this interesting consult.  I greatly enjoyed meeting PAHOUA SCHREINER and look forward to participating in their care.  A copy of this report was sent to the requesting provider on this date.  Electronically Signed: Corrie Mckusick 07/31/2018, 11:49 AM    I spent a total of  40 Minutes   in face to face in clinical consultation, greater than 50% of which was counseling/coordinating care for oligometastatic disease of liver, colon cancer, possible CT guided tissue ablation with microwave technology.

## 2018-08-02 ENCOUNTER — Other Ambulatory Visit (HOSPITAL_COMMUNITY): Payer: Self-pay | Admitting: Interventional Radiology

## 2018-08-02 DIAGNOSIS — C189 Malignant neoplasm of colon, unspecified: Secondary | ICD-10-CM

## 2018-08-02 DIAGNOSIS — C787 Secondary malignant neoplasm of liver and intrahepatic bile duct: Principal | ICD-10-CM

## 2018-08-05 ENCOUNTER — Encounter: Payer: Self-pay | Admitting: Internal Medicine

## 2018-08-05 ENCOUNTER — Inpatient Hospital Stay (HOSPITAL_BASED_OUTPATIENT_CLINIC_OR_DEPARTMENT_OTHER): Payer: BLUE CROSS/BLUE SHIELD | Admitting: Internal Medicine

## 2018-08-05 ENCOUNTER — Inpatient Hospital Stay: Payer: BLUE CROSS/BLUE SHIELD

## 2018-08-05 ENCOUNTER — Other Ambulatory Visit: Payer: Self-pay

## 2018-08-05 VITALS — BP 135/55 | HR 66 | Temp 97.4°F | Resp 16 | Wt 101.0 lb

## 2018-08-05 DIAGNOSIS — Z5112 Encounter for antineoplastic immunotherapy: Secondary | ICD-10-CM | POA: Diagnosis not present

## 2018-08-05 DIAGNOSIS — C182 Malignant neoplasm of ascending colon: Secondary | ICD-10-CM | POA: Diagnosis not present

## 2018-08-05 DIAGNOSIS — I10 Essential (primary) hypertension: Secondary | ICD-10-CM | POA: Diagnosis not present

## 2018-08-05 DIAGNOSIS — C772 Secondary and unspecified malignant neoplasm of intra-abdominal lymph nodes: Principal | ICD-10-CM

## 2018-08-05 DIAGNOSIS — Z87891 Personal history of nicotine dependence: Secondary | ICD-10-CM

## 2018-08-05 DIAGNOSIS — C787 Secondary malignant neoplasm of liver and intrahepatic bile duct: Secondary | ICD-10-CM

## 2018-08-05 DIAGNOSIS — E876 Hypokalemia: Secondary | ICD-10-CM

## 2018-08-05 DIAGNOSIS — G620 Drug-induced polyneuropathy: Secondary | ICD-10-CM

## 2018-08-05 LAB — CBC WITH DIFFERENTIAL/PLATELET
Abs Immature Granulocytes: 0.03 10*3/uL (ref 0.00–0.07)
BASOS PCT: 1 %
Basophils Absolute: 0 10*3/uL (ref 0.0–0.1)
Eosinophils Absolute: 0.1 10*3/uL (ref 0.0–0.5)
Eosinophils Relative: 2 %
HCT: 35.2 % — ABNORMAL LOW (ref 36.0–46.0)
Hemoglobin: 11.2 g/dL — ABNORMAL LOW (ref 12.0–15.0)
Immature Granulocytes: 1 %
Lymphocytes Relative: 19 %
Lymphs Abs: 1.2 10*3/uL (ref 0.7–4.0)
MCH: 30.6 pg (ref 26.0–34.0)
MCHC: 31.8 g/dL (ref 30.0–36.0)
MCV: 96.2 fL (ref 80.0–100.0)
Monocytes Absolute: 0.7 10*3/uL (ref 0.1–1.0)
Monocytes Relative: 12 %
Neutro Abs: 3.9 10*3/uL (ref 1.7–7.7)
Neutrophils Relative %: 65 %
Platelets: 112 10*3/uL — ABNORMAL LOW (ref 150–400)
RBC: 3.66 MIL/uL — ABNORMAL LOW (ref 3.87–5.11)
RDW: 17.8 % — ABNORMAL HIGH (ref 11.5–15.5)
WBC: 6 10*3/uL (ref 4.0–10.5)
nRBC: 0 % (ref 0.0–0.2)

## 2018-08-05 LAB — URINALYSIS, COMPLETE (UACMP) WITH MICROSCOPIC
BACTERIA UA: NONE SEEN
Bilirubin Urine: NEGATIVE
Glucose, UA: NEGATIVE mg/dL
Hgb urine dipstick: NEGATIVE
Ketones, ur: NEGATIVE mg/dL
Nitrite: NEGATIVE
PROTEIN: NEGATIVE mg/dL
Specific Gravity, Urine: 1.012 (ref 1.005–1.030)
pH: 5 (ref 5.0–8.0)

## 2018-08-05 LAB — COMPREHENSIVE METABOLIC PANEL
ALT: 20 U/L (ref 0–44)
AST: 18 U/L (ref 15–41)
Albumin: 3.3 g/dL — ABNORMAL LOW (ref 3.5–5.0)
Alkaline Phosphatase: 64 U/L (ref 38–126)
Anion gap: 4 — ABNORMAL LOW (ref 5–15)
BILIRUBIN TOTAL: 0.5 mg/dL (ref 0.3–1.2)
BUN: 10 mg/dL (ref 6–20)
CO2: 22 mmol/L (ref 22–32)
Calcium: 8.5 mg/dL — ABNORMAL LOW (ref 8.9–10.3)
Chloride: 110 mmol/L (ref 98–111)
Creatinine, Ser: 0.79 mg/dL (ref 0.44–1.00)
GFR calc Af Amer: >60 mL/min (ref >60)
GFR calc non Af Amer: >60 mL/min (ref >60)
Glucose, Bld: 96 mg/dL (ref 70–99)
Potassium: 3.3 mmol/L — ABNORMAL LOW (ref 3.5–5.1)
Sodium: 136 mmol/L (ref 135–145)
TOTAL PROTEIN: 6 g/dL — AB (ref 6.5–8.1)

## 2018-08-05 MED ORDER — SODIUM CHLORIDE 0.9 % IV SOLN
5.0000 mg/kg | INTRAVENOUS | Status: DC
  Administered 2018-08-05: 250 mg via INTRAVENOUS
  Filled 2018-08-05: qty 10

## 2018-08-05 MED ORDER — ONDANSETRON HCL 4 MG/2ML IJ SOLN
8.0000 mg | Freq: Once | INTRAMUSCULAR | Status: AC
  Administered 2018-08-05: 8 mg via INTRAVENOUS
  Filled 2018-08-05: qty 4

## 2018-08-05 MED ORDER — DEXAMETHASONE SODIUM PHOSPHATE 10 MG/ML IJ SOLN
INTRAMUSCULAR | Status: AC
  Filled 2018-08-05: qty 1

## 2018-08-05 MED ORDER — DEXAMETHASONE SODIUM PHOSPHATE 10 MG/ML IJ SOLN
10.0000 mg | Freq: Once | INTRAMUSCULAR | Status: AC
  Administered 2018-08-05: 10 mg via INTRAVENOUS
  Filled 2018-08-05: qty 1

## 2018-08-05 MED ORDER — ATROPINE SULFATE 1 MG/ML IJ SOLN
0.5000 mg | Freq: Once | INTRAMUSCULAR | Status: AC | PRN
  Administered 2018-08-05: 0.5 mg via INTRAVENOUS
  Filled 2018-08-05: qty 1

## 2018-08-05 MED ORDER — SODIUM CHLORIDE 0.9% FLUSH
10.0000 mL | INTRAVENOUS | Status: DC | PRN
  Administered 2018-08-05: 10 mL
  Filled 2018-08-05: qty 10

## 2018-08-05 MED ORDER — SODIUM CHLORIDE 0.9 % IV SOLN
2000.0000 mg/m2 | INTRAVENOUS | Status: DC
  Administered 2018-08-05: 2900 mg via INTRAVENOUS
  Filled 2018-08-05: qty 50

## 2018-08-05 MED ORDER — SODIUM CHLORIDE 0.9 % IV SOLN
Freq: Once | INTRAVENOUS | Status: AC
  Administered 2018-08-05: 11:00:00 via INTRAVENOUS
  Filled 2018-08-05: qty 250

## 2018-08-05 NOTE — Assessment & Plan Note (Addendum)
#  Colon cancer- cecal/ right-sided; STAGE IV; OCT 3rd 2019- CT- slight STABLE liver lesion; but MRI NOV 2019- Stable/improved liver lesion; increasing L-2 lesion; Feb 2020- MRI increasing lesion in liver ~1.8 cm [see below].  CEA -10-15. STABLE.   # proceed with 5FU infusion+ avatsin today; Labs today reviewed;  acceptable for treatment today-tolerating better after cutting down the dose of 5-FU.  # Liver met- increasing-s/p eval with IR- awaiting ablation on 4/15; will plan holding Avastin next visit.  # diarrhea- G-1-2; continue lomotil/ imodium- improved   #L2 metastases status post ablation [Jan third];improved; hydrocodone as needed. X-geva today. STABLE.   # Hypokalemia- 3.1- improved; recommdn continue Kdur one day.  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    # Peripheral neuropathy -STABLE.   # # Educated the patient regarding novel coronavirus-modes of transmission/risks; and measures to avoid infection.   # DISPOSITION:  # Treatment today.  #  Follow up in  2 weeks; MD/ labs- cbc/cmp/cea/UA chemo-5FU pump-avastin;pump off in 2 days- Dr.B

## 2018-08-05 NOTE — Progress Notes (Signed)
[employer] OFFICE PROGRESS NOTE  Patient Care Team: Coral Spikes, DO as PCP - General (Family Medicine)  Cancer Staging No matching staging information was found for the patient.   Oncology History   # [facility] 2017- COLON CANCER- STAGE IV [s/p right hemi-colectomy; pT4apN2 (7/19LN)]; Pre-op CEA- [id number]-  PET-  mesenteric adenopathy/mediastinal adenopathy/right-sided subclavicular lymph node.    April 17th -START FOLFOX; May 1st Add Avastin;   Aug 16th- Disc OX [sec PN]; con 5FU-Avastin; NOV 27th CT C/A/P- CR; except for 69m LUL; 5FU-Avastin q 3 W [Poor tolerance to FOLFOX].  # June/July 2019- Progression of Liver/Abd LN; July 2019- START FOLFIRI + Avastin; STOP iri 02/25/2018- sec to diarrhea; cont Avastin + 5FU  # March 2018- L2 uptake MET s/p RT [[facility]]; mid April X-geva  # L1 ablation/ostecool-  Jan 3rd 2020-   # march 2nd 2020- decrease 5FU LET [place]5 CUV  to 20073mm2/   # MOLECULAR TESTING: K-ras-EXON 2- MUTATED; MSI- STABLE.   DIAGNOSIS: colon ca  STAGE:  IV ;GOALS: pallaitive  CURRENT/MOST RECENT THERAPY: 5FU+Avastin      Cancer of ascending colon metastatic to intra-abdominal lymph node (HCC)      INTERVAL HISTORY:  Nancy BOYETT89.o.  female pleasant patient above history of metastatic adenocarcinoma of the colon currently on 5-FU-avastin is here for follow-up.  Since decreasing the dose of 5-FU pump/continues infusion patient's diarrhea is improved.  Mild to moderate fatigue.  Up to 1 loose stool a day.  Back pain is stable.  No fevers or chills.  No headaches.   Review of Systems  Constitutional: Positive for malaise/fatigue. Negative for chills, diaphoresis, fever and weight loss.  HENT: Negative for nosebleeds and sore throat.   Eyes: Negative for double vision.  Respiratory: Negative for cough, hemoptysis, sputum production, shortness of breath and wheezing.   Cardiovascular: Negative for chest pain, palpitations,  orthopnea and leg swelling.  Gastrointestinal: Positive for diarrhea. Negative for abdominal pain, constipation, heartburn, melena, nausea and vomiting.  Genitourinary: Negative for dysuria, frequency and urgency.  Musculoskeletal: Positive for back pain. Negative for joint pain.  Skin: Negative.  Negative for itching and rash.  Neurological: Positive for tingling. Negative for dizziness, focal weakness, weakness and headaches.  Endo/Heme/Allergies: Does not bruise/bleed easily.  Psychiatric/Behavioral: Negative for depression. The patient is not nervous/anxious and does not have insomnia.       PAST MEDICAL HISTORY :  Past Medical History:  Diagnosis Date  . Chicken pox   . [place] [place]   Partial colectomy 07/2015 + chemo tx's  . Hypertension   . Hypokalemia   . Menopause    > 5 yrs  . [facility]    PAST SURGICAL HISTORY :   Past Surgical History:  Procedure Laterality Date  . BREAST BIOPSY Right    2007? pt unsure done by [facility]  . COLONOSCOPY    . COLONOSCOPY WITH PROPOFOL N/A 10/14/2015   Procedure: COLONOSCOPY WITH PROPOFOL;  Surgeon: PaHulen LusterMD;  [facility];  Service: Endoscopy;  Laterality: N/A;  . COLOSTOMY REVISION Right 08/09/2015   Procedure: COLON RESECTION RIGHT;  Surgeon: RiFlorene GlenMD;  Location: [facility] ORS;  Service: General;  Laterality: Right;  . ECTOPIC PREGNANCY SURGERY    . IR BONE TUMOR(S)RF ABLATION  05/24/2018  . IR KYPHO LUMBAR INC FX REDUCE BONE BX UNI/BIL CANNULATION INC/IMAGING  05/24/2018  . IR RADIOLOGIST EVAL & MGMT  05/08/2018  . IR RADIOLOGIST EVAL & MGMT  06/18/2018  . IR RADIOLOGIST EVAL & MGMT  07/31/2018  . PORTACATH PLACEMENT N/A 09/01/2015   Procedure: INSERTION PORT-A-CATH;  Surgeon: Florene Glen, MD;  Location: [facility] ORS;  Service: General;  Laterality: N/A;  . SHOULDER SURGERY Left     FAMILY HISTORY :   Family History  Problem Relation Age of Onset  . Hypertension Mother    . Arthritis Father   . Prostate cancer Father   . Diabetes Maternal Grandmother   . Colon cancer Maternal Grandfather        colon    SOCIAL HISTORY:   Social History   Tobacco Use  . Smoking status: Former Smoker    Packs/day: 0.25    Years: 2.00    Pack years: 0.50  . Smokeless tobacco: Never Used  . Tobacco comment: recently quit  Substance Use Topics  . Alcohol use: No    Alcohol/week: 0.0 standard drinks  . Drug use: No    ALLERGIES:  has No Known Allergies.  MEDICATIONS:  Current Outpatient Medications  Medication Sig Dispense Refill  . acetaminophen (TYLENOL) 325 MG tablet Take 2 tablets (650 mg total) by mouth every 6 (six) hours as needed for mild pain (or Fever >/= 101).    . benazepril (LOTENSIN) 20 MG tablet TAKE 1 TABLET DAILY 90 tablet 4  . diphenoxylate-atropine (LOMOTIL) 2.5-0.025 MG tablet Take 1 tablet by mouth 4 (four) times daily as needed for diarrhea or loose stools. Take it along with immodium 40 tablet 4  . DULoxetine (CYMBALTA) 20 MG capsule Take 2 capsules (40 mg total) by mouth daily. 180 capsule 3  . feeding supplement (BOOST HIGH PROTEIN) LIQD Take 1 Container by mouth daily.    . ferrous sulfate (FERROUSUL) 325 (65 FE) MG tablet Take 1 tablet (325 mg total) by mouth daily with breakfast. 90 tablet 3  . gabapentin (NEURONTIN) 300 MG capsule One pill in am; and 2 pills at night prior to sleep. 90 capsule 3  . hydrochlorothiazide (MICROZIDE) 12.5 MG capsule Take 1 capsule (12.5 mg total) by mouth daily. 30 capsule 6  . HYDROcodone-acetaminophen (NORCO/VICODIN) 5-325 MG tablet Take 1 tablet by mouth every 12 (twelve) hours as needed for moderate pain. 60 tablet 0  . lidocaine (LIDODERM) 5 % Place 1 patch onto the skin daily. Remove & Discard patch within 12 hours or as directed by MD 30 patch 2  . lidocaine-prilocaine (EMLA) cream Apply 1 application topically as needed. Apply generously over the Mediport 45 minutes prior to chemotherapy. 30 g 6  .  loperamide (IMODIUM) 2 MG capsule Take 2 mg by mouth as needed for diarrhea or loose stools.    . Multiple Vitamins-Minerals (MULTIVITAMIN ADULT PO) Take 1 tablet by mouth daily.    . ondansetron (ZOFRAN) 8 MG tablet One pill every 8 hours as needed for nausea/vomitting. 60 tablet 0  . potassium chloride SA (K-DUR,KLOR-CON) 20 MEQ tablet 1 pill twice a day 60 tablet 3  . prochlorperazine (COMPAZINE) 10 MG tablet Take 1 tablet (10 mg total) by mouth every 6 (six) hours as needed for nausea or vomiting. 90 tablet 3   No current facility-administered medications for this visit.    Facility-Administered Medications Ordered in Other Visits  Medication Dose Route Frequency Provider Last Rate Last Dose  . bevacizumab (AVASTIN) 250 mg in sodium chloride 0.9 % 100 mL chemo infusion  5 mg/kg (Treatment Plan Recorded) Intravenous Q14 Days Cammie Sickle,  MD 660 mL/hr at 08/05/18 1131 250 mg at 08/05/18 1131  . fluorouracil (ADRUCIL) 2,900 mg in sodium chloride 0.9 % 92 mL chemo infusion  2,000 mg/m2 (Treatment Plan Recorded) Intravenous 1 day or 1 dose Charlaine Dalton R, MD      . sodium chloride flush (NS) 0.9 % injection 10 mL  10 mL Intravenous PRN Cammie Sickle, MD   10 mL at 10/04/15 0901  . sodium chloride flush (NS) 0.9 % injection 10 mL  10 mL Intravenous PRN Cammie Sickle, MD   10 mL at 07/02/17 1020  . sodium chloride flush (NS) 0.9 % injection 10 mL  10 mL Intracatheter PRN Cammie Sickle, MD        PHYSICAL EXAMINATION: ECOG PERFORMANCE STATUS: 1 - Symptomatic but completely ambulatory  BP (!) 135/55 (BP Location: Left Arm, Patient Position: Sitting, Cuff Size: Normal)   Pulse 66   Temp (!) 97.4 F (36.3 C) (Tympanic)   Resp 16   Wt 101 lb (45.8 kg)   LMP  (LMP Unknown) Comment: LMP MORE THAN 5 YRS  BMI 19.73 kg/m   Filed Weights   08/05/18 1008  Weight: 101 lb (45.8 kg)    Physical Exam  Constitutional: She is oriented to person, place, and time  and well-developed, well-nourished, and in no distress.  She is accompanied by her mother.  She is walking herself.  HENT:  Head: Normocephalic and atraumatic.  Mouth/Throat: Oropharynx is clear and moist. No oropharyngeal exudate.  Eyes: Pupils are equal, round, and reactive to light.  Neck: Normal range of motion. Neck supple.  Cardiovascular: Normal rate and regular rhythm.  Pulmonary/Chest: No respiratory distress. She has no wheezes.  Abdominal: Soft. Bowel sounds are normal. She exhibits no distension and no mass. There is no abdominal tenderness. There is no rebound and no guarding.  Musculoskeletal: Normal range of motion.        General: No tenderness or edema.  Neurological: She is alert and oriented to person, place, and time.  Skin: Skin is warm.  Psychiatric: Affect normal.       LABORATORY DATA:  I have reviewed the data as listed    Component Value Date/Time   NA 136 08/05/2018 0917   K 3.3 (L) 08/05/2018 0917   CL 110 08/05/2018 0917   CO2 22 08/05/2018 0917   GLUCOSE 96 08/05/2018 0917   BUN 10 08/05/2018 0917   CREATININE 0.79 08/05/2018 0917   CALCIUM 8.5 (L) 08/05/2018 0917   PROT 6.0 (L) 08/05/2018 0917   ALBUMIN 3.3 (L) 08/05/2018 0917   AST 18 08/05/2018 0917   ALT 20 08/05/2018 0917   ALKPHOS 64 08/05/2018 0917   BILITOT 0.5 08/05/2018 0917   GFRNONAA >60 08/05/2018 0917   GFRAA >60 08/05/2018 0917    No results found for: SPEP, UPEP  Lab Results  Component Value Date   WBC 6.0 08/05/2018   NEUTROABS 3.9 08/05/2018   HGB 11.2 (L) 08/05/2018   HCT 35.2 (L) 08/05/2018   MCV 96.2 08/05/2018   PLT 112 (L) 08/05/2018      Chemistry      Component Value Date/Time   NA 136 08/05/2018 0917   K 3.3 (L) 08/05/2018 0917   CL 110 08/05/2018 0917   CO2 22 08/05/2018 0917   BUN 10 08/05/2018 0917   CREATININE 0.79 08/05/2018 0917      Component Value Date/Time   CALCIUM 8.5 (L) 08/05/2018 0917   ALKPHOS 64 08/05/2018 0917  AST 18  08/05/2018 0917   ALT 20 08/05/2018 0917   BILITOT 0.5 08/05/2018 5102       RADIOGRAPHIC STUDIES: I have personally reviewed the radiological images as listed and agreed with the findings in the report. No results found.   ASSESSMENT & PLAN:  Cancer of ascending colon metastatic to intra-abdominal lymph node ([place]) # Colon cancer- cecal/ right-sided; STAGE IV; OCT 3rd 2019- CT- slight STABLE liver lesion; but MRI NOV 2019- Stable/improved liver lesion; increasing L-2 lesion; Feb 2020- MRI increasing lesion in liver ~1.8 cm [see below].  CEA -10-15. STABLE.   # proceed with 5FU infusion+ avatsin today; Labs today reviewed;  acceptable for treatment today-tolerating better after cutting down the dose of 5-FU.  # Liver met- increasing-s/p eval with IR- awaiting ablation on 4/15; will plan holding Avastin next visit.  # diarrhea- G-1-2; continue lomotil/ imodium- improved   #L2 metastases status post ablation [Jan third];improved; hydrocodone as needed. X-geva today. STABLE.   # Hypokalemia- 3.1- improved; recommdn continue Kdur one day.  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    # Peripheral neuropathy -STABLE.   # # Educated the patient regarding novel coronavirus-modes of transmission/risks; and measures to avoid infection.   # DISPOSITION:  # Treatment today.  #  Follow up in  2 weeks; MD/ labs- cbc/cmp/cea/UA chemo-5FU pump-avastin;pump off in 2 days- Dr.B   No orders of the defined types were placed in this encounter.  All questions were answered. The patient knows to call the clinic with any problems, questions or concerns.      Cammie Sickle, MD 08/05/2018 11:50 AM

## 2018-08-06 LAB — CEA: CEA: 12.5 ng/mL — ABNORMAL HIGH (ref 0.0–4.7)

## 2018-08-07 ENCOUNTER — Inpatient Hospital Stay: Payer: BLUE CROSS/BLUE SHIELD

## 2018-08-07 ENCOUNTER — Other Ambulatory Visit: Payer: Self-pay

## 2018-08-07 DIAGNOSIS — C182 Malignant neoplasm of ascending colon: Secondary | ICD-10-CM

## 2018-08-07 DIAGNOSIS — Z5112 Encounter for antineoplastic immunotherapy: Secondary | ICD-10-CM | POA: Diagnosis not present

## 2018-08-07 DIAGNOSIS — C772 Secondary and unspecified malignant neoplasm of intra-abdominal lymph nodes: Principal | ICD-10-CM

## 2018-08-07 MED ORDER — HEPARIN SOD (PORK) LOCK FLUSH 100 UNIT/ML IV SOLN
500.0000 [IU] | Freq: Once | INTRAVENOUS | Status: AC
  Administered 2018-08-07: 500 [IU] via INTRAVENOUS

## 2018-08-07 MED ORDER — HEPARIN SOD (PORK) LOCK FLUSH 100 UNIT/ML IV SOLN
INTRAVENOUS | Status: AC
  Filled 2018-08-07: qty 5

## 2018-08-07 MED ORDER — SODIUM CHLORIDE 0.9% FLUSH
10.0000 mL | INTRAVENOUS | Status: DC | PRN
  Administered 2018-08-07: 10 mL via INTRAVENOUS
  Filled 2018-08-07: qty 10

## 2018-08-14 ENCOUNTER — Other Ambulatory Visit (HOSPITAL_COMMUNITY): Payer: Self-pay | Admitting: Interventional Radiology

## 2018-08-14 DIAGNOSIS — C787 Secondary malignant neoplasm of liver and intrahepatic bile duct: Principal | ICD-10-CM

## 2018-08-14 DIAGNOSIS — C189 Malignant neoplasm of colon, unspecified: Secondary | ICD-10-CM

## 2018-08-16 ENCOUNTER — Other Ambulatory Visit: Payer: Self-pay

## 2018-08-19 ENCOUNTER — Encounter: Payer: Self-pay | Admitting: Internal Medicine

## 2018-08-19 ENCOUNTER — Other Ambulatory Visit: Payer: Self-pay

## 2018-08-19 ENCOUNTER — Inpatient Hospital Stay: Payer: BLUE CROSS/BLUE SHIELD

## 2018-08-19 ENCOUNTER — Inpatient Hospital Stay (HOSPITAL_BASED_OUTPATIENT_CLINIC_OR_DEPARTMENT_OTHER): Payer: BLUE CROSS/BLUE SHIELD | Admitting: Internal Medicine

## 2018-08-19 VITALS — BP 134/84 | HR 86 | Temp 96.9°F | Resp 16 | Wt 97.2 lb

## 2018-08-19 DIAGNOSIS — G620 Drug-induced polyneuropathy: Secondary | ICD-10-CM

## 2018-08-19 DIAGNOSIS — C772 Secondary and unspecified malignant neoplasm of intra-abdominal lymph nodes: Principal | ICD-10-CM

## 2018-08-19 DIAGNOSIS — Z5112 Encounter for antineoplastic immunotherapy: Secondary | ICD-10-CM | POA: Diagnosis not present

## 2018-08-19 DIAGNOSIS — C787 Secondary malignant neoplasm of liver and intrahepatic bile duct: Secondary | ICD-10-CM

## 2018-08-19 DIAGNOSIS — C7951 Secondary malignant neoplasm of bone: Secondary | ICD-10-CM

## 2018-08-19 DIAGNOSIS — C182 Malignant neoplasm of ascending colon: Secondary | ICD-10-CM | POA: Diagnosis not present

## 2018-08-19 DIAGNOSIS — E876 Hypokalemia: Secondary | ICD-10-CM

## 2018-08-19 DIAGNOSIS — I10 Essential (primary) hypertension: Secondary | ICD-10-CM

## 2018-08-19 DIAGNOSIS — Z87891 Personal history of nicotine dependence: Secondary | ICD-10-CM

## 2018-08-19 LAB — COMPREHENSIVE METABOLIC PANEL
ALK PHOS: 61 U/L (ref 38–126)
ALT: 11 U/L (ref 0–44)
ANION GAP: 9 (ref 5–15)
AST: 17 U/L (ref 15–41)
Albumin: 3.7 g/dL (ref 3.5–5.0)
BUN: 12 mg/dL (ref 6–20)
CO2: 19 mmol/L — ABNORMAL LOW (ref 22–32)
Calcium: 8.7 mg/dL — ABNORMAL LOW (ref 8.9–10.3)
Chloride: 107 mmol/L (ref 98–111)
Creatinine, Ser: 0.84 mg/dL (ref 0.44–1.00)
GFR calc Af Amer: >60 mL/min (ref >60)
GFR calc non Af Amer: >60 mL/min (ref >60)
Glucose, Bld: 92 mg/dL (ref 70–99)
Potassium: 3.7 mmol/L (ref 3.5–5.1)
Sodium: 135 mmol/L (ref 135–145)
Total Bilirubin: 0.4 mg/dL (ref 0.3–1.2)
Total Protein: 6.8 g/dL (ref 6.5–8.1)

## 2018-08-19 LAB — CBC WITH DIFFERENTIAL/PLATELET
Abs Immature Granulocytes: 0.02 10*3/uL (ref 0.00–0.07)
Basophils Absolute: 0.1 10*3/uL (ref 0.0–0.1)
Basophils Relative: 1 %
Eosinophils Absolute: 0.1 10*3/uL (ref 0.0–0.5)
Eosinophils Relative: 2 %
HCT: 39.4 % (ref 36.0–46.0)
Hemoglobin: 12.7 g/dL (ref 12.0–15.0)
Immature Granulocytes: 0 %
Lymphocytes Relative: 22 %
Lymphs Abs: 1.3 10*3/uL (ref 0.7–4.0)
MCH: 30.8 pg (ref 26.0–34.0)
MCHC: 32.2 g/dL (ref 30.0–36.0)
MCV: 95.6 fL (ref 80.0–100.0)
Monocytes Absolute: 0.8 10*3/uL (ref 0.1–1.0)
Monocytes Relative: 13 %
Neutro Abs: 3.6 10*3/uL (ref 1.7–7.7)
Neutrophils Relative %: 62 %
Platelets: 134 10*3/uL — ABNORMAL LOW (ref 150–400)
RBC: 4.12 MIL/uL (ref 3.87–5.11)
RDW: 17.5 % — ABNORMAL HIGH (ref 11.5–15.5)
WBC: 6 10*3/uL (ref 4.0–10.5)
nRBC: 0 % (ref 0.0–0.2)

## 2018-08-19 LAB — URINALYSIS, COMPLETE (UACMP) WITH MICROSCOPIC
BACTERIA UA: NONE SEEN
Bilirubin Urine: NEGATIVE
Glucose, UA: NEGATIVE mg/dL
Hgb urine dipstick: NEGATIVE
Ketones, ur: NEGATIVE mg/dL
Leukocytes,Ua: NEGATIVE
Nitrite: NEGATIVE
Protein, ur: NEGATIVE mg/dL
Specific Gravity, Urine: 1.014 (ref 1.005–1.030)
pH: 5 (ref 5.0–8.0)

## 2018-08-19 MED ORDER — SODIUM CHLORIDE 0.9 % IV SOLN
2000.0000 mg/m2 | INTRAVENOUS | Status: AC
  Administered 2018-08-19: 2900 mg via INTRAVENOUS
  Filled 2018-08-19: qty 50

## 2018-08-19 MED ORDER — SODIUM CHLORIDE 0.9% FLUSH
10.0000 mL | Freq: Once | INTRAVENOUS | Status: AC
  Administered 2018-08-19: 10 mL via INTRAVENOUS
  Filled 2018-08-19: qty 10

## 2018-08-19 MED ORDER — DEXAMETHASONE SODIUM PHOSPHATE 10 MG/ML IJ SOLN
10.0000 mg | Freq: Once | INTRAMUSCULAR | Status: AC
  Administered 2018-08-19: 10 mg via INTRAVENOUS
  Filled 2018-08-19: qty 1

## 2018-08-19 MED ORDER — HEPARIN SOD (PORK) LOCK FLUSH 100 UNIT/ML IV SOLN: 500.0000 [IU] | Freq: Once | INTRAVENOUS | Status: DC

## 2018-08-19 MED ORDER — ONDANSETRON HCL 4 MG/2ML IJ SOLN
8.0000 mg | Freq: Once | INTRAMUSCULAR | Status: AC
  Administered 2018-08-19: 8 mg via INTRAVENOUS
  Filled 2018-08-19: qty 4

## 2018-08-19 MED ORDER — SODIUM CHLORIDE 0.9 % IV SOLN
Freq: Once | INTRAVENOUS | Status: AC
  Administered 2018-08-19: 10:00:00 via INTRAVENOUS
  Filled 2018-08-19: qty 250

## 2018-08-19 NOTE — Progress Notes (Signed)
[employer] OFFICE PROGRESS NOTE  Patient Care Team: Nancy Spikes, DO as PCP - General (Family Medicine)  Cancer Staging No matching staging information was found for the patient.   Oncology History   # [facility] 2017- COLON CANCER- STAGE IV [s/p right hemi-colectomy; pT4apN2 (7/19LN)]; Pre-op CEA- [id number]-  PET-  mesenteric adenopathy/mediastinal adenopathy/right-sided subclavicular lymph node.    April 17th -START FOLFOX; May 1st Add Avastin;   Aug 16th- Disc OX [sec PN]; con 5FU-Avastin; NOV 27th CT C/A/P- CR; except for 15m LUL; 5FU-Avastin q 3 W [Poor tolerance to FOLFOX].  # June/July 2019- Progression of Liver/Abd LN; July 2019- START FOLFIRI + Avastin; STOP iri 02/25/2018- sec to diarrhea; cont Avastin + 5FU  # March 2018- L2 uptake MET s/p RT [[facility]2018]; mid April X-geva  # L1 ablation/ostecool-  Jan 3rd 2020-   # march 2nd 2020- decrease 5FU LET [place]5 CUV  to 20066mm2/   # MOLECULAR TESTING: K-ras-EXON 2- MUTATED; MSI- STABLE.   DIAGNOSIS: colon ca  STAGE:  IV ;GOALS: pallaitive  CURRENT/MOST RECENT THERAPY: 5FU+Avastin      Cancer of ascending colon metastatic to intra-abdominal lymph node (HCC)      INTERVAL HISTORY:  MaMONTEZ STRYKER89.o.  female pleasant patient above history of metastatic adenocarcinoma of the colon currently on 5-FU-avastin is here for follow-up.  Patient denies any nausea vomiting but appetite is fair.  Diarrhea improved.  Chronic mild back pain.  Chronic mild tingling and numbness.  Review of Systems  Constitutional: Positive for malaise/fatigue. Negative for chills, diaphoresis, fever and weight loss.  HENT: Negative for nosebleeds and sore throat.   Eyes: Negative for double vision.  Respiratory: Negative for cough, hemoptysis, sputum production, shortness of breath and wheezing.   Cardiovascular: Negative for chest pain, palpitations, orthopnea and leg swelling.  Gastrointestinal: Negative for abdominal  pain, constipation, heartburn, melena, nausea and vomiting.  Genitourinary: Negative for dysuria, frequency and urgency.  Musculoskeletal: Positive for back pain. Negative for joint pain.  Skin: Negative.  Negative for itching and rash.  Neurological: Positive for tingling. Negative for dizziness, focal weakness, weakness and headaches.  Endo/Heme/Allergies: Does not bruise/bleed easily.  Psychiatric/Behavioral: Negative for depression. The patient is not nervous/anxious and does not have insomnia.       PAST MEDICAL HISTORY :  Past Medical History:  Diagnosis Date  . Chicken pox   . [place]   Partial colectomy 07/2015 + chemo tx's  . Hypertension   . Hypokalemia   . Menopause    > 5 yrs  . [facility]    PAST SURGICAL HISTORY :   Past Surgical History:  Procedure Laterality Date  . BREAST BIOPSY Right    2007? pt unsure done by [facility]  . COLONOSCOPY    . COLONOSCOPY WITH PROPOFOL N/A 10/14/2015   Procedure: COLONOSCOPY WITH PROPOFOL;  Surgeon: PaHulen LusterMD;  [facility];  Service: Endoscopy;  Laterality: N/A;  . COLOSTOMY REVISION Right 08/09/2015   Procedure: COLON RESECTION RIGHT;  Surgeon: RiFlorene GlenMD;  Location: [facility] ORS;  Service: General;  Laterality: Right;  . ECTOPIC PREGNANCY SURGERY    . IR BONE TUMOR(S)RF ABLATION  05/24/2018  . IR KYPHO LUMBAR INC FX REDUCE BONE BX UNI/BIL CANNULATION INC/IMAGING  05/24/2018  . IR RADIOLOGIST EVAL & MGMT  05/08/2018  . IR RADIOLOGIST EVAL & MGMT  06/18/2018  . IR RADIOLOGIST EVAL & MGMT  07/31/2018  . PORTACATH PLACEMENT N/A 09/01/2015   Procedure: INSERTION PORT-A-CATH;  Surgeon: Florene Glen, MD;  Location: [facility] ORS;  Service: General;  Laterality: N/A;  . SHOULDER SURGERY Left     FAMILY HISTORY :   Family History  Problem Relation Age of Onset  . Hypertension Mother   . Arthritis Father   . Prostate cancer Father   . Diabetes Maternal Grandmother   .  Colon cancer Maternal Grandfather        colon    SOCIAL HISTORY:   Social History   Tobacco Use  . Smoking status: Former Smoker    Packs/day: 0.25    Years: 2.00    Pack years: 0.50  . Smokeless tobacco: Never Used  . Tobacco comment: recently quit  Substance Use Topics  . Alcohol use: No    Alcohol/week: 0.0 standard drinks  . Drug use: No    ALLERGIES:  has No Known Allergies.  MEDICATIONS:  Current Outpatient Medications  Medication Sig Dispense Refill  . acetaminophen (TYLENOL) 325 MG tablet Take 2 tablets (650 mg total) by mouth every 6 (six) hours as needed for mild pain (or Fever >/= 101).    . benazepril (LOTENSIN) 20 MG tablet TAKE 1 TABLET DAILY (Patient taking differently: Take 20 mg by mouth daily. ) 90 tablet 4  . Calcium Carbonate-Vitamin D (CALCIUM 600/VITAMIN D PO) Take 1 tablet by mouth daily.    . diphenoxylate-atropine (LOMOTIL) 2.5-0.025 MG tablet Take 1 tablet by mouth 4 (four) times daily as needed for diarrhea or loose stools. Take it along with immodium 40 tablet 4  . DULoxetine (CYMBALTA) 20 MG capsule Take 2 capsules (40 mg total) by mouth daily. (Patient taking differently: Take 20 mg by mouth 2 (two) times daily. ) 180 capsule 3  . feeding supplement (BOOST HIGH PROTEIN) LIQD Take 1 Container by mouth daily.     . ferrous sulfate (FERROUSUL) 325 (65 FE) MG tablet Take 1 tablet (325 mg total) by mouth daily with breakfast. 90 tablet 3  . gabapentin (NEURONTIN) 300 MG capsule One pill in am; and 2 pills at night prior to sleep. (Patient taking differently: Take 300-600 mg by mouth See admin instructions. Take 300 mg by mouth in the morning and 600 mg at bedtime) 90 capsule 3  . hydrochlorothiazide (MICROZIDE) 12.5 MG capsule Take 1 capsule (12.5 mg total) by mouth daily. 30 capsule 6  . HYDROcodone-acetaminophen (NORCO/VICODIN) 5-325 MG tablet Take 1 tablet by mouth every 12 (twelve) hours as needed for moderate pain. 60 tablet 0  . lidocaine (LIDODERM)  5 % Place 1 patch onto the skin daily. Remove & Discard patch within 12 hours or as directed by MD 30 patch 2  . lidocaine-prilocaine (EMLA) cream Apply 1 application topically as needed. Apply generously over the Mediport 45 minutes prior to chemotherapy. 30 g 6  . loperamide (IMODIUM) 2 MG capsule Take 2 mg by mouth as needed for diarrhea or loose stools.    . Multiple Vitamins-Minerals (MULTIVITAMIN ADULT PO) Take 1 tablet by mouth daily.    . ondansetron (ZOFRAN) 8 MG tablet One pill every 8 hours as needed for nausea/vomitting. (Patient taking differently: Take 8 mg by mouth every 8 (eight) hours as needed for nausea or vomiting. One pill every 8 hours as needed for nausea/vomitting.) 60 tablet 0  . potassium chloride SA (K-DUR,KLOR-CON) 20 MEQ tablet 1 pill twice a day (Patient taking differently: Take 20 mEq by mouth daily. ) 60 tablet  3  . prochlorperazine (COMPAZINE) 10 MG tablet Take 1 tablet (10 mg total) by mouth every 6 (six) hours as needed for nausea or vomiting. 90 tablet 3   No current facility-administered medications for this visit.    Facility-Administered Medications Ordered in Other Visits  Medication Dose Route Frequency Provider Last Rate Last Dose  . heparin lock flush 100 unit/mL  500 Units Intravenous Once Charlaine Dalton R, MD      . sodium chloride flush (NS) 0.9 % injection 10 mL  10 mL Intravenous PRN Cammie Sickle, MD   10 mL at 10/04/15 0901  . sodium chloride flush (NS) 0.9 % injection 10 mL  10 mL Intravenous PRN Cammie Sickle, MD   10 mL at 07/02/17 1020    PHYSICAL EXAMINATION: ECOG PERFORMANCE STATUS: 1 - Symptomatic but completely ambulatory  BP 134/84 (BP Location: Left Arm, Patient Position: Sitting, Cuff Size: Normal)   Pulse 86   Temp (!) 96.9 F (36.1 C) (Tympanic)   Resp 16   Wt 97 lb 3.2 oz (44.1 kg)   LMP  (LMP Unknown) Comment: LMP MORE THAN 5 YRS  BMI 18.98 kg/m   Filed Weights   08/19/18 0921  Weight: 97 lb 3.2 oz  (44.1 kg)    Physical Exam  Constitutional: She is oriented to person, place, and time and well-developed, well-nourished, and in no distress.  She is alone. She is walking herself.  HENT:  Head: Normocephalic and atraumatic.  Mouth/Throat: Oropharynx is clear and moist. No oropharyngeal exudate.  Eyes: Pupils are equal, round, and reactive to light.  Neck: Normal range of motion. Neck supple.  Cardiovascular: Normal rate and regular rhythm.  Pulmonary/Chest: No respiratory distress. She has no wheezes.  Abdominal: Soft. Bowel sounds are normal. She exhibits no distension and no mass. There is no abdominal tenderness. There is no rebound and no guarding.  Musculoskeletal: Normal range of motion.        General: No tenderness or edema.  Neurological: She is alert and oriented to person, place, and time.  Skin: Skin is warm.  Psychiatric: Affect normal.       LABORATORY DATA:  I have reviewed the data as listed    Component Value Date/Time   NA 135 08/19/2018 0847   K 3.7 08/19/2018 0847   CL 107 08/19/2018 0847   CO2 19 (L) 08/19/2018 0847   GLUCOSE 92 08/19/2018 0847   BUN 12 08/19/2018 0847   CREATININE 0.84 08/19/2018 0847   CALCIUM 8.7 (L) 08/19/2018 0847   PROT 6.8 08/19/2018 0847   ALBUMIN 3.7 08/19/2018 0847   AST 17 08/19/2018 0847   ALT 11 08/19/2018 0847   ALKPHOS 61 08/19/2018 0847   BILITOT 0.4 08/19/2018 0847   GFRNONAA >60 08/19/2018 0847   GFRAA >60 08/19/2018 0847    No results found for: SPEP, UPEP  Lab Results  Component Value Date   WBC 6.0 08/19/2018   NEUTROABS 3.6 08/19/2018   HGB 12.7 08/19/2018   HCT 39.4 08/19/2018   MCV 95.6 08/19/2018   PLT 134 (L) 08/19/2018      Chemistry      Component Value Date/Time   NA 135 08/19/2018 0847   K 3.7 08/19/2018 0847   CL 107 08/19/2018 0847   CO2 19 (L) 08/19/2018 0847   BUN 12 08/19/2018 0847   CREATININE 0.84 08/19/2018 0847      Component Value Date/Time   CALCIUM 8.7 (L) 08/19/2018  0847   ALKPHOS  61 08/19/2018 0847   AST 17 08/19/2018 0847   ALT 11 08/19/2018 0847   BILITOT 0.4 08/19/2018 0847       RADIOGRAPHIC STUDIES: I have personally reviewed the radiological images as listed and agreed with the findings in the report. No results found.   ASSESSMENT & PLAN:  Cancer of ascending colon metastatic to intra-abdominal lymph node ([place]) # Colon cancer- cecal/ right-sided; STAGE IV; OCT 3rd 2019- CT- slight STABLE liver lesion; but MRI NOV 2019- Stable/improved liver lesion; increasing L-2 lesion; Feb 2020- MRI increasing lesion in liver ~1.8 cm [see below].  CEA -10-12. STABLE.   # proceed with 5FU infusion only today; Labs today reviewed;  acceptable for treatment toda. HOLD avastin sec to planned ablation on April 15th.   # Liver met- increasing-s/p eval with IR- awaiting ablation on 4/15;  # diarrhea- G-1-2; continue lomotil/ imodium-Improved.   #L2 metastases status post ablation [Jan third];  hydrocodone as needed. X-geva today.stable.   # Hypokalemia- 3.1- improved; recommdn continue Kdur one day.  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     # Peripheral neuropathy -stable.   # re- Educated the patient regarding novel coronavirus-modes of transmission/risks; and measures to avoid infection.   # DISPOSITION:  # Treatment today; only 5FU; NO avastin.   #  Follow up in  4 weeks; MD/ labs- cbc/cmp/cea/UA chemo-5FU pump-avastin;pump off in 2 days- Dr.B   No orders of the defined types were placed in this encounter.  All questions were answered. The patient knows to call the clinic with any problems, questions or concerns.      Cammie Sickle, MD 08/19/2018 9:55 AM

## 2018-08-19 NOTE — Assessment & Plan Note (Addendum)
#  Colon cancer- cecal/ right-sided; STAGE IV; OCT 3rd 2019- CT- slight STABLE liver lesion; but MRI NOV 2019- Stable/improved liver lesion; increasing L-2 lesion; Feb 2020- MRI increasing lesion in liver ~1.8 cm [see below].  CEA -10-12. STABLE.   # proceed with 5FU infusion only today; Labs today reviewed;  acceptable for treatment toda. HOLD avastin sec to planned ablation on April 15th.   # Liver met- increasing-s/p eval with IR- awaiting ablation on 4/15;  # diarrhea- G-1-2; continue lomotil/ imodium-Improved.   #L2 metastases status post ablation [Jan third];  hydrocodone as needed. X-geva today.stable.   # Hypokalemia- 3.1- improved; recommdn continue Kdur one day.  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     # Peripheral neuropathy -stable.   # re- Educated the patient regarding novel coronavirus-modes of transmission/risks; and measures to avoid infection.   # DISPOSITION:  # Treatment today; only 5FU; NO avastin.   #  Follow up in  4 weeks; MD/ labs- cbc/cmp/cea/UA chemo-5FU pump-avastin;pump off in 2 days- Dr.B

## 2018-08-20 ENCOUNTER — Other Ambulatory Visit: Payer: Self-pay

## 2018-08-20 LAB — CEA: CEA: 15.2 ng/mL — ABNORMAL HIGH (ref 0.0–4.7)

## 2018-08-21 ENCOUNTER — Inpatient Hospital Stay: Payer: BLUE CROSS/BLUE SHIELD | Attending: Internal Medicine

## 2018-08-21 ENCOUNTER — Other Ambulatory Visit: Payer: Self-pay

## 2018-08-21 DIAGNOSIS — C7951 Secondary malignant neoplasm of bone: Secondary | ICD-10-CM | POA: Insufficient documentation

## 2018-08-21 DIAGNOSIS — G629 Polyneuropathy, unspecified: Secondary | ICD-10-CM | POA: Diagnosis not present

## 2018-08-21 DIAGNOSIS — Z87891 Personal history of nicotine dependence: Secondary | ICD-10-CM | POA: Insufficient documentation

## 2018-08-21 DIAGNOSIS — Z79899 Other long term (current) drug therapy: Secondary | ICD-10-CM | POA: Diagnosis not present

## 2018-08-21 DIAGNOSIS — C772 Secondary and unspecified malignant neoplasm of intra-abdominal lymph nodes: Secondary | ICD-10-CM | POA: Diagnosis not present

## 2018-08-21 DIAGNOSIS — Z5111 Encounter for antineoplastic chemotherapy: Secondary | ICD-10-CM | POA: Insufficient documentation

## 2018-08-21 DIAGNOSIS — C787 Secondary malignant neoplasm of liver and intrahepatic bile duct: Secondary | ICD-10-CM | POA: Diagnosis not present

## 2018-08-21 DIAGNOSIS — I10 Essential (primary) hypertension: Secondary | ICD-10-CM | POA: Insufficient documentation

## 2018-08-21 DIAGNOSIS — C182 Malignant neoplasm of ascending colon: Secondary | ICD-10-CM | POA: Diagnosis not present

## 2018-08-21 DIAGNOSIS — Z5112 Encounter for antineoplastic immunotherapy: Secondary | ICD-10-CM | POA: Diagnosis not present

## 2018-08-21 DIAGNOSIS — E876 Hypokalemia: Secondary | ICD-10-CM | POA: Insufficient documentation

## 2018-08-21 MED ORDER — HEPARIN SOD (PORK) LOCK FLUSH 100 UNIT/ML IV SOLN
500.0000 [IU] | Freq: Once | INTRAVENOUS | Status: AC | PRN
  Administered 2018-08-21: 12:00:00 500 [IU]

## 2018-08-21 MED ORDER — HEPARIN SOD (PORK) LOCK FLUSH 100 UNIT/ML IV SOLN
INTRAVENOUS | Status: AC
  Filled 2018-08-21: qty 5

## 2018-08-21 MED ORDER — SODIUM CHLORIDE 0.9% FLUSH
10.0000 mL | INTRAVENOUS | Status: DC | PRN
  Administered 2018-08-21: 10 mL
  Filled 2018-08-21: qty 10

## 2018-08-28 ENCOUNTER — Inpatient Hospital Stay (HOSPITAL_COMMUNITY): Admission: RE | Admit: 2018-08-28 | Payer: BLUE CROSS/BLUE SHIELD | Source: Ambulatory Visit

## 2018-09-04 ENCOUNTER — Ambulatory Visit (HOSPITAL_COMMUNITY): Admission: RE | Admit: 2018-09-04 | Payer: BLUE CROSS/BLUE SHIELD | Source: Ambulatory Visit

## 2018-09-04 ENCOUNTER — Encounter (HOSPITAL_COMMUNITY): Admission: RE | Payer: Self-pay | Source: Home / Self Care

## 2018-09-04 ENCOUNTER — Ambulatory Visit (HOSPITAL_COMMUNITY)
Admission: RE | Admit: 2018-09-04 | Payer: BLUE CROSS/BLUE SHIELD | Source: Home / Self Care | Admitting: Interventional Radiology

## 2018-09-04 SURGERY — CT WITH ANESTHESIA
Anesthesia: General | Laterality: Right

## 2018-09-06 ENCOUNTER — Other Ambulatory Visit: Payer: Self-pay | Admitting: *Deleted

## 2018-09-06 ENCOUNTER — Telehealth: Payer: Self-pay | Admitting: *Deleted

## 2018-09-06 DIAGNOSIS — R112 Nausea with vomiting, unspecified: Secondary | ICD-10-CM

## 2018-09-06 DIAGNOSIS — C772 Secondary and unspecified malignant neoplasm of intra-abdominal lymph nodes: Principal | ICD-10-CM

## 2018-09-06 DIAGNOSIS — C182 Malignant neoplasm of ascending colon: Secondary | ICD-10-CM

## 2018-09-06 DIAGNOSIS — R197 Diarrhea, unspecified: Secondary | ICD-10-CM

## 2018-09-06 DIAGNOSIS — T451X5A Adverse effect of antineoplastic and immunosuppressive drugs, initial encounter: Secondary | ICD-10-CM

## 2018-09-06 NOTE — Telephone Encounter (Signed)
Call returned to patient regarding refill requests left on triage vm. Prescriptions for zofran, lomoti, gabapentin and hydrocodone have all been sent to Dr. Jacinto Reap for approval.

## 2018-09-09 MED ORDER — HYDROCODONE-ACETAMINOPHEN 5-325 MG PO TABS: 1.0000 | ORAL_TABLET | Freq: Two times a day (BID) | ORAL | 0 refills | Status: DC | PRN

## 2018-09-09 MED ORDER — ONDANSETRON HCL 8 MG PO TABS: ORAL_TABLET | ORAL | 0 refills | Status: AC

## 2018-09-09 MED ORDER — DIPHENOXYLATE-ATROPINE 2.5-0.025 MG PO TABS: 1.0000 | ORAL_TABLET | Freq: Four times a day (QID) | ORAL | 4 refills | Status: DC | PRN

## 2018-09-09 MED ORDER — GABAPENTIN 300 MG PO CAPS: ORAL_CAPSULE | ORAL | 3 refills | Status: DC

## 2018-09-15 ENCOUNTER — Other Ambulatory Visit: Payer: Self-pay

## 2018-09-16 ENCOUNTER — Inpatient Hospital Stay: Payer: BLUE CROSS/BLUE SHIELD

## 2018-09-16 ENCOUNTER — Other Ambulatory Visit: Payer: Self-pay

## 2018-09-16 ENCOUNTER — Inpatient Hospital Stay (HOSPITAL_BASED_OUTPATIENT_CLINIC_OR_DEPARTMENT_OTHER): Payer: BLUE CROSS/BLUE SHIELD | Admitting: Internal Medicine

## 2018-09-16 VITALS — BP 138/82 | HR 79 | Temp 96.9°F | Resp 18

## 2018-09-16 VITALS — Ht 60.0 in | Wt 96.9 lb

## 2018-09-16 DIAGNOSIS — C7951 Secondary malignant neoplasm of bone: Secondary | ICD-10-CM

## 2018-09-16 DIAGNOSIS — C772 Secondary and unspecified malignant neoplasm of intra-abdominal lymph nodes: Principal | ICD-10-CM

## 2018-09-16 DIAGNOSIS — E876 Hypokalemia: Secondary | ICD-10-CM

## 2018-09-16 DIAGNOSIS — C182 Malignant neoplasm of ascending colon: Secondary | ICD-10-CM | POA: Diagnosis not present

## 2018-09-16 DIAGNOSIS — C787 Secondary malignant neoplasm of liver and intrahepatic bile duct: Secondary | ICD-10-CM | POA: Diagnosis not present

## 2018-09-16 DIAGNOSIS — Z87891 Personal history of nicotine dependence: Secondary | ICD-10-CM

## 2018-09-16 DIAGNOSIS — G629 Polyneuropathy, unspecified: Secondary | ICD-10-CM

## 2018-09-16 DIAGNOSIS — R197 Diarrhea, unspecified: Secondary | ICD-10-CM

## 2018-09-16 DIAGNOSIS — Z95828 Presence of other vascular implants and grafts: Secondary | ICD-10-CM

## 2018-09-16 LAB — URINALYSIS, COMPLETE (UACMP) WITH MICROSCOPIC
Bacteria, UA: NONE SEEN
Bilirubin Urine: NEGATIVE
Glucose, UA: NEGATIVE mg/dL
Hgb urine dipstick: NEGATIVE
Ketones, ur: NEGATIVE mg/dL
Leukocytes,Ua: NEGATIVE
Nitrite: NEGATIVE
Protein, ur: NEGATIVE mg/dL
Specific Gravity, Urine: 1.013 (ref 1.005–1.030)
pH: 5 (ref 5.0–8.0)

## 2018-09-16 LAB — COMPREHENSIVE METABOLIC PANEL
ALT: 11 U/L (ref 0–44)
AST: 17 U/L (ref 15–41)
Albumin: 3.7 g/dL (ref 3.5–5.0)
Alkaline Phosphatase: 60 U/L (ref 38–126)
Anion gap: 8 (ref 5–15)
BUN: 9 mg/dL (ref 6–20)
CO2: 25 mmol/L (ref 22–32)
Calcium: 8.2 mg/dL — ABNORMAL LOW (ref 8.9–10.3)
Chloride: 106 mmol/L (ref 98–111)
Creatinine, Ser: 0.75 mg/dL (ref 0.44–1.00)
GFR calc Af Amer: >60 mL/min (ref >60)
GFR calc non Af Amer: >60 mL/min (ref >60)
Glucose, Bld: 102 mg/dL — ABNORMAL HIGH (ref 70–99)
Potassium: 3.3 mmol/L — ABNORMAL LOW (ref 3.5–5.1)
Sodium: 139 mmol/L (ref 135–145)
Total Bilirubin: 0.3 mg/dL (ref 0.3–1.2)
Total Protein: 6.6 g/dL (ref 6.5–8.1)

## 2018-09-16 LAB — CBC WITH DIFFERENTIAL/PLATELET
Abs Immature Granulocytes: 0.03 10*3/uL (ref 0.00–0.07)
Basophils Absolute: 0.1 10*3/uL (ref 0.0–0.1)
Basophils Relative: 1 %
Eosinophils Absolute: 0.2 10*3/uL (ref 0.0–0.5)
Eosinophils Relative: 2 %
HCT: 37.2 % (ref 36.0–46.0)
Hemoglobin: 12 g/dL (ref 12.0–15.0)
Immature Granulocytes: 0 %
Lymphocytes Relative: 19 %
Lymphs Abs: 1.4 10*3/uL (ref 0.7–4.0)
MCH: 30.8 pg (ref 26.0–34.0)
MCHC: 32.3 g/dL (ref 30.0–36.0)
MCV: 95.4 fL (ref 80.0–100.0)
Monocytes Absolute: 0.8 10*3/uL (ref 0.1–1.0)
Monocytes Relative: 11 %
Neutro Abs: 4.9 10*3/uL (ref 1.7–7.7)
Neutrophils Relative %: 67 %
Platelets: 166 10*3/uL (ref 150–400)
RBC: 3.9 MIL/uL (ref 3.87–5.11)
RDW: 16.6 % — ABNORMAL HIGH (ref 11.5–15.5)
WBC: 7.3 10*3/uL (ref 4.0–10.5)
nRBC: 0 % (ref 0.0–0.2)

## 2018-09-16 MED ORDER — ONDANSETRON HCL 4 MG/2ML IJ SOLN
8.0000 mg | Freq: Once | INTRAMUSCULAR | Status: AC
  Administered 2018-09-16: 12:00:00 8 mg via INTRAVENOUS
  Filled 2018-09-16: qty 4

## 2018-09-16 MED ORDER — SODIUM CHLORIDE 0.9 % IV SOLN
5.0000 mg/kg | INTRAVENOUS | Status: DC
  Administered 2018-09-16: 250 mg via INTRAVENOUS
  Filled 2018-09-16: qty 10

## 2018-09-16 MED ORDER — SODIUM CHLORIDE 0.9 % IV SOLN
2000.0000 mg/m2 | INTRAVENOUS | Status: DC
  Administered 2018-09-16: 12:00:00 2900 mg via INTRAVENOUS
  Filled 2018-09-16: qty 50

## 2018-09-16 MED ORDER — DEXAMETHASONE SODIUM PHOSPHATE 10 MG/ML IJ SOLN
10.0000 mg | Freq: Once | INTRAMUSCULAR | Status: AC
  Administered 2018-09-16: 10 mg via INTRAVENOUS
  Filled 2018-09-16: qty 1

## 2018-09-16 MED ORDER — SODIUM CHLORIDE 0.9% FLUSH
10.0000 mL | Freq: Once | INTRAVENOUS | Status: AC
  Administered 2018-09-16: 10 mL via INTRAVENOUS
  Filled 2018-09-16: qty 10

## 2018-09-16 MED ORDER — SODIUM CHLORIDE 0.9 % IV SOLN
Freq: Once | INTRAVENOUS | Status: AC
  Administered 2018-09-16: 11:00:00 via INTRAVENOUS
  Filled 2018-09-16: qty 250

## 2018-09-16 MED ORDER — FENTANYL 25 MCG/HR TD PT72: 1.0000 | MEDICATED_PATCH | TRANSDERMAL | 0 refills | Status: DC

## 2018-09-16 NOTE — Progress Notes (Signed)
[facility] OFFICE PROGRESS NOTE  Patient Care Team: Coral Spikes, DO as PCP - General (Family Medicine)  Cancer Staging No matching staging information was found for the patient.   Oncology History   # [facility] 2017- COLON CANCER- STAGE IV [s/p right hemi-colectomy; pT4apN2 (7/19LN)]; Pre-op CEA- [id number]  PET-  mesenteric adenopathy/mediastinal adenopathy/right-sided subclavicular lymph node.    April 17th -START FOLFOX; May 1st Add Avastin;   Aug 16th- Disc OX [sec PN]; con 5FU-Avastin; NOV 27th CT C/A/P- CR; except for 56m LUL; 5FU-Avastin q 3 W [Poor tolerance to FOLFOX].  # June/July 2019- Progression of Liver/Abd LN; July 2019- START FOLFIRI + Avastin; STOP iri 02/25/2018- sec to diarrhea; cont Avastin + 5FU  # March 2018- L2 uptake MET s/p RT [[facility]2018]; mid April X-geva  # L1 ablation/ostecool-  Jan 3rd 2020-   # march 2nd 2020- decrease 5FU LET [place]5 CUV  to 20035mm2/   # MOLECULAR TESTING: K-ras-EXON 2- MUTATED; MSI- STABLE.   DIAGNOSIS: colon ca  STAGE:  IV ;GOALS: pallaitive  CURRENT/MOST RECENT THERAPY: 5FU+Avastin      Cancer of ascending colon metastatic to intra-abdominal lymph node (HCC)      INTERVAL HISTORY:  Nancy [id number].  female pleasant patient above history of metastatic adenocarcinoma of the colon currently on 5-FU-avastin is here for follow-up.  Patient's liver ablation was held because of COVID pandemic.  Complains of worsening pain in the back.  Patient is currently on hydrocodone twice a day.  No new fevers or chills.   Review of Systems  Constitutional: Positive for malaise/fatigue. Negative for chills, diaphoresis, fever and weight loss.  HENT: Negative for nosebleeds and sore throat.   Eyes: Negative for double vision.  Respiratory: Negative for cough, hemoptysis, sputum production, shortness of breath and wheezing.   Cardiovascular: Negative for chest pain, palpitations, orthopnea and leg swelling.   Gastrointestinal: Negative for abdominal pain, constipation, heartburn, melena, nausea and vomiting.  Genitourinary: Negative for dysuria, frequency and urgency.  Musculoskeletal: Positive for back pain. Negative for joint pain.  Skin: Negative.  Negative for itching and rash.  Neurological: Positive for tingling. Negative for dizziness, focal weakness, weakness and headaches.  Endo/Heme/Allergies: Does not bruise/bleed easily.  Psychiatric/Behavioral: Negative for depression. The patient is not nervous/anxious and does not have insomnia.       PAST MEDICAL HISTORY :  Past Medical History:  Diagnosis Date  . Chicken pox   . [place] cancer [place]   Partial colectomy 07/2015 + chemo tx's  . Hypertension   . Hypokalemia   . Menopause    > 5 yrs  . [facility]    PAST SURGICAL HISTORY :   Past Surgical History:  Procedure Laterality Date  . BREAST BIOPSY Right    2007? pt unsure done by [facility]  . COLONOSCOPY    . COLONOSCOPY WITH PROPOFOL N/A 10/14/2015   Procedure: COLONOSCOPY WITH PROPOFOL;  Surgeon: PaHulen LusterMD;  [facility];  Service: Endoscopy;  Laterality: N/A;  . COLOSTOMY REVISION Right 08/09/2015   Procedure: COLON RESECTION RIGHT;  Surgeon: RiFlorene GlenMD;  Location: [facility] ORS;  Service: General;  Laterality: Right;  . ECTOPIC PREGNANCY SURGERY    . IR BONE TUMOR(S)RF ABLATION  05/24/2018  . IR KYPHO LUMBAR INC FX REDUCE BONE BX UNI/BIL CANNULATION INC/IMAGING  05/24/2018  . IR RADIOLOGIST EVAL & MGMT  05/08/2018  . IR RADIOLOGIST EVAL & MGMT  06/18/2018  . IR RADIOLOGIST EVAL & MGMT  07/31/2018  . PORTACATH PLACEMENT N/A 09/01/2015   Procedure: INSERTION PORT-A-CATH;  Surgeon: Florene Glen, MD;  Location: [facility] ORS;  Service: General;  Laterality: N/A;  . SHOULDER SURGERY Left     FAMILY HISTORY :   Family History  Problem Relation Age of Onset  . Hypertension Mother   . Arthritis Father   . Prostate cancer Father    . Diabetes Maternal Grandmother   . Colon cancer Maternal Grandfather        colon    SOCIAL HISTORY:   Social History   Tobacco Use  . Smoking status: Former Smoker    Packs/day: 0.25    Years: 2.00    Pack years: 0.50  . Smokeless tobacco: Never Used  . Tobacco comment: recently quit  Substance Use Topics  . Alcohol use: No    Alcohol/week: 0.0 standard drinks  . Drug use: No    ALLERGIES:  has No Known Allergies.  MEDICATIONS:  Current Outpatient Medications  Medication Sig Dispense Refill  . acetaminophen (TYLENOL) 325 MG tablet Take 2 tablets (650 mg total) by mouth every 6 (six) hours as needed for mild pain (or Fever >/= 101).    . benazepril (LOTENSIN) 20 MG tablet TAKE 1 TABLET DAILY (Patient taking differently: Take 20 mg by mouth daily. ) 90 tablet 4  . Calcium Carbonate-Vitamin D (CALCIUM 600/VITAMIN D PO) Take 1 tablet by mouth daily.    . diphenoxylate-atropine (LOMOTIL) 2.5-0.025 MG tablet Take 1 tablet by mouth 4 (four) times daily as needed for diarrhea or loose stools. Take it along with immodium 40 tablet 4  . DULoxetine (CYMBALTA) 20 MG capsule Take 2 capsules (40 mg total) by mouth daily. (Patient taking differently: Take 20 mg by mouth 2 (two) times daily. ) 180 capsule 3  . feeding supplement (BOOST HIGH PROTEIN) LIQD Take 1 Container by mouth daily.     . fentaNYL (DURAGESIC) 25 MCG/HR Place 1 patch onto the skin every 3 (three) days. 10 patch 0  . ferrous sulfate (FERROUSUL) 325 (65 FE) MG tablet Take 1 tablet (325 mg total) by mouth daily with breakfast. 90 tablet 3  . gabapentin (NEURONTIN) 300 MG capsule One pill in am; and 2 pills at night prior to sleep. 90 capsule 3  . hydrochlorothiazide (MICROZIDE) 12.5 MG capsule Take 1 capsule (12.5 mg total) by mouth daily. 30 capsule 6  . HYDROcodone-acetaminophen (NORCO/VICODIN) 5-325 MG tablet Take 1 tablet by mouth every 12 (twelve) hours as needed for moderate pain. 60 tablet 0  . lidocaine (LIDODERM) 5  % Place 1 patch onto the skin daily. Remove & Discard patch within 12 hours or as directed by MD 30 patch 2  . lidocaine-prilocaine (EMLA) cream Apply 1 application topically as needed. Apply generously over the Mediport 45 minutes prior to chemotherapy. 30 g 6  . loperamide (IMODIUM) 2 MG capsule Take 2 mg by mouth as needed for diarrhea or loose stools.    . Multiple Vitamins-Minerals (MULTIVITAMIN ADULT PO) Take 1 tablet by mouth daily.    . ondansetron (ZOFRAN) 8 MG tablet One pill every 8 hours as needed for nausea/vomitting. 60 tablet 0  . potassium chloride SA (K-DUR,KLOR-CON) 20 MEQ tablet 1 pill twice a day (Patient taking differently: Take 20 mEq by mouth daily. ) 60 tablet 3  . prochlorperazine (COMPAZINE) 10 MG tablet Take 1 tablet (10 mg total) by mouth every 6 (six) hours as needed for  nausea or vomiting. 90 tablet 3   No current facility-administered medications for this visit.    Facility-Administered Medications Ordered in Other Visits  Medication Dose Route Frequency Provider Last Rate Last Dose  . heparin lock flush 100 unit/mL  500 Units Intravenous Once Charlaine Dalton R, MD      . sodium chloride flush (NS) 0.9 % injection 10 mL  10 mL Intravenous PRN Cammie Sickle, MD   10 mL at 10/04/15 0901  . sodium chloride flush (NS) 0.9 % injection 10 mL  10 mL Intravenous PRN Cammie Sickle, MD   10 mL at 07/02/17 1020    PHYSICAL EXAMINATION: ECOG PERFORMANCE STATUS: 1 - Symptomatic but completely ambulatory  BP 138/82   Pulse 79   Temp (!) 96.9 F (36.1 C) (Tympanic)   Resp 18   LMP  (LMP Unknown) Comment: LMP MORE THAN 5 YRS  There were no vitals filed for this visit.  Physical Exam  Constitutional: She is oriented to person, place, and time and well-developed, well-nourished, and in no distress.  She is alone. She is walking herself.  HENT:  Head: Normocephalic and atraumatic.  Mouth/Throat: Oropharynx is clear and moist. No oropharyngeal exudate.   Eyes: Pupils are equal, round, and reactive to light.  Neck: Normal range of motion. Neck supple.  Cardiovascular: Normal rate and regular rhythm.  Pulmonary/Chest: No respiratory distress. She has no wheezes.  Abdominal: Soft. Bowel sounds are normal. She exhibits no distension and no mass. There is no abdominal tenderness. There is no rebound and no guarding.  Musculoskeletal: Normal range of motion.        General: No tenderness or edema.  Neurological: She is alert and oriented to person, place, and time.  Skin: Skin is warm.  Psychiatric: Affect normal.       LABORATORY DATA:  I have reviewed the data as listed    Component Value Date/Time   NA 139 09/16/2018 0908   K 3.3 (L) 09/16/2018 0908   CL 106 09/16/2018 0908   CO2 25 09/16/2018 0908   GLUCOSE 102 (H) 09/16/2018 0908   BUN 9 09/16/2018 0908   CREATININE 0.75 09/16/2018 0908   CALCIUM 8.2 (L) 09/16/2018 0908   PROT 6.6 09/16/2018 0908   ALBUMIN 3.7 09/16/2018 0908   AST 17 09/16/2018 0908   ALT 11 09/16/2018 0908   ALKPHOS 60 09/16/2018 0908   BILITOT 0.3 09/16/2018 0908   GFRNONAA >60 09/16/2018 0908   GFRAA >60 09/16/2018 0908    No results found for: SPEP, UPEP  Lab Results  Component Value Date   WBC 7.3 09/16/2018   NEUTROABS 4.9 09/16/2018   HGB 12.0 09/16/2018   HCT 37.2 09/16/2018   MCV 95.4 09/16/2018   PLT 166 09/16/2018      Chemistry      Component Value Date/Time   NA 139 09/16/2018 0908   K 3.3 (L) 09/16/2018 0908   CL 106 09/16/2018 0908   CO2 25 09/16/2018 0908   BUN 9 09/16/2018 0908   CREATININE 0.75 09/16/2018 0908      Component Value Date/Time   CALCIUM 8.2 (L) 09/16/2018 0908   ALKPHOS 60 09/16/2018 0908   AST 17 09/16/2018 0908   ALT 11 09/16/2018 0908   BILITOT 0.3 09/16/2018 0908       RADIOGRAPHIC STUDIES: I have personally reviewed the radiological images as listed and agreed with the findings in the report. No results found.   ASSESSMENT & PLAN:  Cancer  of ascending colon metastatic to intra-abdominal lymph node ([place]) # Colon cancer- cecal/ right-sided; STAGE IV; OCT 3rd 2019- CT- slight STABLE liver lesion; but MRI NOV 2019- Stable/improved liver lesion; increasing L-2 lesion; Feb 2020- MRI increasing lesion in liver ~1.8 cm [see below].  CEA -10-12. STABLE.   # proceed with 5FU infusion only today; Labs today reviewed;  acceptable for treatment today.   # Liver met- increasing-s/p eval with IR- Ablation planned on 4/15 - sec sec to COVID pandemic.   # diarrhea- G-1-2; continue lomotil/ imodium-Improved.   #L2 metastases status post ablation [Jan third]; worse; add fenatnyl patch 25;  hydrocodone as needed.  # Hypokalemia- 3.3- STABLE>.  # Dysphagia- ? Intermittent- monitor for now. If wose- will get barium esophagogram.   # Hypocalcima- stable.    # Peripheral neuropathy -stable.   # DISPOSITION:  # Treatment today; CIV 5FU; avastin    #  Follow up in  2 weeks; MD/ labs- cbc/cmp/cea/UA chemo-5FU pump-avastin;pump off in 2 days- Dr.B   No orders of the defined types were placed in this encounter.  All questions were answered. The patient knows to call the clinic with any problems, questions or concerns.      Cammie Sickle, MD 09/16/2018 10:24 AM

## 2018-09-16 NOTE — Assessment & Plan Note (Addendum)
#  Colon cancer- cecal/ right-sided; STAGE IV; OCT 3rd 2019- CT- slight STABLE liver lesion; but MRI NOV 2019- Stable/improved liver lesion; increasing L-2 lesion; Feb 2020- MRI increasing lesion in liver ~1.8 cm [see below].  CEA -10-12. STABLE.   # proceed with 5FU infusion only today; Labs today reviewed;  acceptable for treatment today.   # Liver met- increasing-s/p eval with IR- Ablation planned on 4/15 - sec sec to COVID pandemic.   # diarrhea- G-1-2; continue lomotil/ imodium-Improved.   #L2 metastases status post ablation [Jan third]; worse; add fenatnyl patch 25;  hydrocodone as needed.  # Hypokalemia- 3.3- STABLE>.  # Dysphagia- ? Intermittent- monitor for now. If wose- will get barium esophagogram.   # Hypocalcima- stable.    # Peripheral neuropathy -stable.   # DISPOSITION:  # Treatment today; CIV 5FU; avastin    #  Follow up in  2 weeks; MD/ labs- cbc/cmp/cea/UA chemo-5FU pump-avastin;pump off in 2 days- Dr.B

## 2018-09-17 LAB — CEA: CEA: 18 ng/mL — ABNORMAL HIGH (ref 0.0–4.7)

## 2018-09-18 ENCOUNTER — Inpatient Hospital Stay: Payer: BLUE CROSS/BLUE SHIELD

## 2018-09-18 ENCOUNTER — Other Ambulatory Visit: Payer: Self-pay

## 2018-09-18 DIAGNOSIS — C801 Malignant (primary) neoplasm, unspecified: Secondary | ICD-10-CM

## 2018-09-18 DIAGNOSIS — C182 Malignant neoplasm of ascending colon: Secondary | ICD-10-CM | POA: Diagnosis not present

## 2018-09-18 MED ORDER — SODIUM CHLORIDE 0.9% FLUSH
10.0000 mL | INTRAVENOUS | Status: DC | PRN
  Administered 2018-09-18: 10 mL via INTRAVENOUS
  Filled 2018-09-18: qty 10

## 2018-09-18 MED ORDER — HEPARIN SOD (PORK) LOCK FLUSH 100 UNIT/ML IV SOLN
500.0000 [IU] | Freq: Once | INTRAVENOUS | Status: AC
  Administered 2018-09-18: 12:00:00 500 [IU] via INTRAVENOUS

## 2018-09-29 ENCOUNTER — Other Ambulatory Visit: Payer: Self-pay

## 2018-09-30 ENCOUNTER — Telehealth: Payer: Self-pay | Admitting: Internal Medicine

## 2018-09-30 ENCOUNTER — Inpatient Hospital Stay: Payer: BC Managed Care – PPO

## 2018-09-30 ENCOUNTER — Inpatient Hospital Stay (HOSPITAL_BASED_OUTPATIENT_CLINIC_OR_DEPARTMENT_OTHER): Payer: BC Managed Care – PPO | Admitting: Internal Medicine

## 2018-09-30 ENCOUNTER — Encounter: Payer: Self-pay | Admitting: Internal Medicine

## 2018-09-30 ENCOUNTER — Inpatient Hospital Stay: Payer: BC Managed Care – PPO | Attending: Internal Medicine

## 2018-09-30 ENCOUNTER — Other Ambulatory Visit: Payer: Self-pay

## 2018-09-30 VITALS — BP 132/81 | HR 73 | Temp 97.0°F | Resp 18 | Ht 60.0 in | Wt 100.4 lb

## 2018-09-30 DIAGNOSIS — C182 Malignant neoplasm of ascending colon: Secondary | ICD-10-CM

## 2018-09-30 DIAGNOSIS — Z87891 Personal history of nicotine dependence: Secondary | ICD-10-CM | POA: Diagnosis not present

## 2018-09-30 DIAGNOSIS — Z5112 Encounter for antineoplastic immunotherapy: Secondary | ICD-10-CM | POA: Insufficient documentation

## 2018-09-30 DIAGNOSIS — C772 Secondary and unspecified malignant neoplasm of intra-abdominal lymph nodes: Secondary | ICD-10-CM

## 2018-09-30 DIAGNOSIS — I10 Essential (primary) hypertension: Secondary | ICD-10-CM | POA: Diagnosis not present

## 2018-09-30 DIAGNOSIS — C7951 Secondary malignant neoplasm of bone: Secondary | ICD-10-CM | POA: Insufficient documentation

## 2018-09-30 DIAGNOSIS — Z79899 Other long term (current) drug therapy: Secondary | ICD-10-CM | POA: Insufficient documentation

## 2018-09-30 DIAGNOSIS — C787 Secondary malignant neoplasm of liver and intrahepatic bile duct: Secondary | ICD-10-CM | POA: Diagnosis not present

## 2018-09-30 DIAGNOSIS — D649 Anemia, unspecified: Secondary | ICD-10-CM | POA: Diagnosis not present

## 2018-09-30 DIAGNOSIS — Z923 Personal history of irradiation: Secondary | ICD-10-CM | POA: Diagnosis not present

## 2018-09-30 DIAGNOSIS — R197 Diarrhea, unspecified: Secondary | ICD-10-CM | POA: Insufficient documentation

## 2018-09-30 DIAGNOSIS — Z5111 Encounter for antineoplastic chemotherapy: Secondary | ICD-10-CM | POA: Insufficient documentation

## 2018-09-30 DIAGNOSIS — G629 Polyneuropathy, unspecified: Secondary | ICD-10-CM | POA: Diagnosis not present

## 2018-09-30 DIAGNOSIS — E876 Hypokalemia: Secondary | ICD-10-CM | POA: Diagnosis not present

## 2018-09-30 DIAGNOSIS — G893 Neoplasm related pain (acute) (chronic): Secondary | ICD-10-CM | POA: Diagnosis not present

## 2018-09-30 LAB — URINALYSIS, COMPLETE (UACMP) WITH MICROSCOPIC
Bacteria, UA: NONE SEEN
Bilirubin Urine: NEGATIVE
Glucose, UA: NEGATIVE mg/dL
Hgb urine dipstick: NEGATIVE
Ketones, ur: NEGATIVE mg/dL
Leukocytes,Ua: NEGATIVE
Nitrite: NEGATIVE
Protein, ur: NEGATIVE mg/dL
Specific Gravity, Urine: 1.009 (ref 1.005–1.030)
pH: 5 (ref 5.0–8.0)

## 2018-09-30 LAB — COMPREHENSIVE METABOLIC PANEL
ALT: 8 U/L (ref 0–44)
AST: 15 U/L (ref 15–41)
Albumin: 3.3 g/dL — ABNORMAL LOW (ref 3.5–5.0)
Alkaline Phosphatase: 49 U/L (ref 38–126)
Anion gap: 8 (ref 5–15)
BUN: 11 mg/dL (ref 6–20)
CO2: 24 mmol/L (ref 22–32)
Calcium: 8.4 mg/dL — ABNORMAL LOW (ref 8.9–10.3)
Chloride: 103 mmol/L (ref 98–111)
Creatinine, Ser: 0.84 mg/dL (ref 0.44–1.00)
GFR calc Af Amer: >60 mL/min (ref >60)
GFR calc non Af Amer: >60 mL/min (ref >60)
Glucose, Bld: 121 mg/dL — ABNORMAL HIGH (ref 70–99)
Potassium: 3.7 mmol/L (ref 3.5–5.1)
Sodium: 135 mmol/L (ref 135–145)
Total Bilirubin: 0.4 mg/dL (ref 0.3–1.2)
Total Protein: 6.2 g/dL — ABNORMAL LOW (ref 6.5–8.1)

## 2018-09-30 LAB — CBC WITH DIFFERENTIAL/PLATELET
Abs Immature Granulocytes: 0.04 10*3/uL (ref 0.00–0.07)
Basophils Absolute: 0 10*3/uL (ref 0.0–0.1)
Basophils Relative: 0 %
Eosinophils Absolute: 0.1 10*3/uL (ref 0.0–0.5)
Eosinophils Relative: 2 %
HCT: 33.4 % — ABNORMAL LOW (ref 36.0–46.0)
Hemoglobin: 10.7 g/dL — ABNORMAL LOW (ref 12.0–15.0)
Immature Granulocytes: 1 %
Lymphocytes Relative: 11 %
Lymphs Abs: 1 10*3/uL (ref 0.7–4.0)
MCH: 30.8 pg (ref 26.0–34.0)
MCHC: 32 g/dL (ref 30.0–36.0)
MCV: 96.3 fL (ref 80.0–100.0)
Monocytes Absolute: 0.9 10*3/uL (ref 0.1–1.0)
Monocytes Relative: 10 %
Neutro Abs: 6.8 10*3/uL (ref 1.7–7.7)
Neutrophils Relative %: 76 %
Platelets: 115 10*3/uL — ABNORMAL LOW (ref 150–400)
RBC: 3.47 MIL/uL — ABNORMAL LOW (ref 3.87–5.11)
RDW: 16 % — ABNORMAL HIGH (ref 11.5–15.5)
WBC: 8.8 10*3/uL (ref 4.0–10.5)
nRBC: 0 % (ref 0.0–0.2)

## 2018-09-30 MED ORDER — SODIUM CHLORIDE 0.9% FLUSH
10.0000 mL | INTRAVENOUS | Status: DC | PRN
  Filled 2018-09-30: qty 10

## 2018-09-30 MED ORDER — ONDANSETRON HCL 4 MG/2ML IJ SOLN
8.0000 mg | Freq: Once | INTRAMUSCULAR | Status: AC
  Administered 2018-09-30: 8 mg via INTRAVENOUS
  Filled 2018-09-30: qty 4

## 2018-09-30 MED ORDER — SODIUM CHLORIDE 0.9 % IV SOLN
5.0000 mg/kg | INTRAVENOUS | Status: DC
  Administered 2018-09-30: 250 mg via INTRAVENOUS
  Filled 2018-09-30: qty 10

## 2018-09-30 MED ORDER — HEPARIN SOD (PORK) LOCK FLUSH 100 UNIT/ML IV SOLN: 500.0000 [IU] | Freq: Once | INTRAVENOUS | Status: DC

## 2018-09-30 MED ORDER — SODIUM CHLORIDE 0.9 % IV SOLN
Freq: Once | INTRAVENOUS | Status: AC
  Administered 2018-09-30: 12:00:00 via INTRAVENOUS
  Filled 2018-09-30: qty 250

## 2018-09-30 MED ORDER — SODIUM CHLORIDE 0.9 % IV SOLN
2000.0000 mg/m2 | INTRAVENOUS | Status: DC
  Administered 2018-09-30: 13:00:00 2900 mg via INTRAVENOUS
  Filled 2018-09-30: qty 50

## 2018-09-30 MED ORDER — DEXAMETHASONE SODIUM PHOSPHATE 10 MG/ML IJ SOLN
10.0000 mg | Freq: Once | INTRAMUSCULAR | Status: AC
  Administered 2018-09-30: 10 mg via INTRAVENOUS
  Filled 2018-09-30: qty 1

## 2018-09-30 NOTE — Assessment & Plan Note (Addendum)
#   Colon cancer- cecal/ right-sided; STAGE IV; OCT 3rd 2019- CT- slight STABLE liver lesion; but MRI NOV 2019- Stable/improved liver lesion; increasing L-2 lesion; Feb 2020- MRI increasing lesion in liver ~1.8 cm [see below].  CEA -18; slowly rising.  # proceed with 5FU infusion only today; Labs today reviewed;  acceptable for treatment today.  Discussed with the patient that has a tumor marker slightly rising and concerned of continued progression of disease.  I have reached out to Dr. Earleen Newport interventional radiology regarding rescheduling for liver ablation.  Because that in general patient systemic therapy options are limited-reintroduction of irinotecan/oxaliplatin; TAS- 102/Stivarga.   # diarrhea- G-1-2; continue lomotil/ imodium-stable  #L2 metastases status post ablation [Jan third]-on fentanyl/hydrocodone as needed stable.  Delton See currently on hold because of ongoing hypocalcemia.  # Hypokalemia- 3.7 stable.   # Hypocalcima-stable  # Peripheral neuropathy -stable  # DISPOSITION:  # Treatment today; CIV 5FU; avastin    #  Follow up in  2 weeks; X- MD/ labs- cbc/cmp/cea/UA chemo-5FU pump-avastin; pump off in 2 days- Dr.B  Addendum: # Discussed with Dr.Wagner; will order CT scan a/p for re-evaluation of liver metastases. NO contraindications with Avastin. Dr.Wagner plans to schedule the liver ablation in next few weeks. IR will contact for her liver ablation.

## 2018-09-30 NOTE — Progress Notes (Signed)
Nancy Clark  Patient Care Team: Nancy Clark, Nancy Clark as PCP - General (Family Medicine)  Cancer Staging No matching staging information was found for the patient.   Oncology History   # [facility] 2017- COLON CANCER- STAGE IV [s/p right hemi-colectomy; pT4apN2 (7/19LN)]; Pre-op CEA- [id number]  PET-  mesenteric adenopathy/mediastinal adenopathy/right-sided subclavicular lymph node.    April 17th -START FOLFOX; May 1st Add Avastin;   Aug 16th- Disc OX [sec PN]; con 5FU-Avastin; NOV 27th CT C/A/P- CR; except for 49m LUL; 5FU-Avastin q 3 W [Poor tolerance to FOLFOX].  # June/July 2019- Progression of Liver/Abd LN; July 2019- START FOLFIRI + Avastin; STOP iri 02/25/2018- sec to diarrhea; cont Avastin + 5FU  # March 2018- L2 uptake MET s/p RT [[facility]2018]; mid April X-geva  # L1 ablation/ostecool-  Jan 3rd 2020-   # march 2nd 2020- decrease 5FU LET [place]5 CUV  to 20021mm2/   # MOLECULAR TESTING: K-ras-EXON 2- MUTATED; MSI- STABLE.   DIAGNOSIS: colon ca  STAGE:  IV ;GOALS: pallaitive  CURRENT/MOST RECENT THERAPY: 5FU+Avastin      Cancer of ascending colon metastatic to intra-abdominal lymph node (HCC)      INTERVAL HISTORY:  Nancy [id number].  female pleasant patient above history of metastatic adenocarcinoma of the colon currently on 5-FU-avastin is here for follow-up.  Patient appetite is fair.  Diarrhea is controlled.  Continues to have tingling and numbness of the extremities not any worse.  Back pain is improved since being on the fentanyl patches.  No nausea no vomiting.  She has not heard back yet from interventional radiology regarding her liver ablation.  Review of Systems  Constitutional: Positive for malaise/fatigue. Negative for chills, diaphoresis, fever and weight loss.  HENT: Negative for nosebleeds and sore throat.   Eyes: Negative for double vision.  Respiratory: Negative for cough, hemoptysis, sputum production,  shortness of breath and wheezing.   Cardiovascular: Negative for chest pain, palpitations, orthopnea and leg swelling.  Gastrointestinal: Negative for abdominal pain, constipation, heartburn, melena, nausea and vomiting.  Genitourinary: Negative for dysuria, frequency and urgency.  Musculoskeletal: Positive for back pain. Negative for joint pain.  Skin: Negative.  Negative for itching and rash.  Neurological: Positive for tingling. Negative for dizziness, focal weakness, weakness and headaches.  Endo/Heme/Allergies: Does not bruise/bleed easily.  Psychiatric/Behavioral: Negative for depression. The patient is not nervous/anxious and does not have insomnia.       PAST MEDICAL HISTORY :  Past Medical History:  Diagnosis Date  . Chicken pox   . [place]   Partial colectomy 07/2015 + chemo tx's  . Hypertension   . Hypokalemia   . Menopause    > 5 yrs  . [facility]    PAST SURGICAL HISTORY :   Past Surgical History:  Procedure Laterality Date  . BREAST BIOPSY Right    2007? pt unsure done by [facility]  . COLONOSCOPY    . COLONOSCOPY WITH PROPOFOL N/A 10/14/2015   Procedure: COLONOSCOPY WITH PROPOFOL;  Surgeon: PaHulen LusterMD;  [facility];  Service: Endoscopy;  Laterality: N/A;  . COLOSTOMY REVISION Right 08/09/2015   Procedure: COLON RESECTION RIGHT;  Surgeon: RiFlorene GlenMD;  Location: [facility] ORS;  Service: General;  Laterality: Right;  . ECTOPIC PREGNANCY SURGERY    . IR BONE TUMOR(S)RF ABLATION  05/24/2018  . IR KYPHO LUMBAR INC FX REDUCE BONE BX UNI/BIL CANNULATION INC/IMAGING  05/24/2018  . IR RADIOLOGIST EVAL & MGMT  05/08/2018  . IR RADIOLOGIST EVAL & MGMT  06/18/2018  . IR RADIOLOGIST EVAL & MGMT  07/31/2018  . PORTACATH PLACEMENT N/A 09/01/2015   Procedure: INSERTION PORT-A-CATH;  Surgeon: Florene Glen, Nancy Clark;  Location: [facility] ORS;  Service: General;  Laterality: N/A;  . SHOULDER SURGERY Left     FAMILY HISTORY :    Family History  Problem Relation Age of Onset  . Hypertension Mother   . Arthritis Father   . Prostate cancer Father   . Diabetes Maternal Grandmother   . Colon cancer Maternal Grandfather        colon    SOCIAL HISTORY:   Social History   Tobacco Use  . Smoking status: Former Smoker    Packs/day: 0.25    Years: 2.00    Pack years: 0.50  . Smokeless tobacco: Never Used  . Tobacco comment: recently quit  Substance Use Topics  . Alcohol use: No    Alcohol/week: 0.0 standard drinks  . Drug use: No    ALLERGIES:  has No Known Allergies.  MEDICATIONS:  Current Outpatient Medications  Medication Sig Dispense Refill  . acetaminophen (TYLENOL) 325 MG tablet Take 2 tablets (650 mg total) by mouth every 6 (six) hours as needed for mild pain (or Fever >/= 101).    . benazepril (LOTENSIN) 20 MG tablet TAKE 1 TABLET DAILY (Patient taking differently: Take 20 mg by mouth daily. ) 90 tablet 4  . Calcium Carbonate-Vitamin D (CALCIUM 600/VITAMIN D PO) Take 1 tablet by mouth daily.    . diphenoxylate-atropine (LOMOTIL) 2.5-0.025 MG tablet Take 1 tablet by mouth 4 (four) times daily as needed for diarrhea or loose stools. Take it along with immodium 40 tablet 4  . DULoxetine (CYMBALTA) 20 MG capsule Take 2 capsules (40 mg total) by mouth daily. (Patient taking differently: Take 20 mg by mouth 2 (two) times daily. ) 180 capsule 3  . feeding supplement (BOOST HIGH PROTEIN) LIQD Take 1 Container by mouth daily.     . fentaNYL (DURAGESIC) 25 MCG/HR Place 1 patch onto the skin every 3 (three) days. 10 patch 0  . ferrous sulfate (FERROUSUL) 325 (65 FE) MG tablet Take 1 tablet (325 mg total) by mouth daily with breakfast. 90 tablet 3  . gabapentin (NEURONTIN) 300 MG capsule One pill in am; and 2 pills at night prior to sleep. 90 capsule 3  . hydrochlorothiazide (MICROZIDE) 12.5 MG capsule Take 1 capsule (12.5 mg total) by mouth daily. 30 capsule 6  . HYDROcodone-acetaminophen (NORCO/VICODIN) 5-325  MG tablet Take 1 tablet by mouth every 12 (twelve) hours as needed for moderate pain. 60 tablet 0  . lidocaine (LIDODERM) 5 % Place 1 patch onto the skin daily. Remove & Discard patch within 12 hours or as directed by Nancy Clark 30 patch 2  . lidocaine-prilocaine (EMLA) cream Apply 1 application topically as needed. Apply generously over the Mediport 45 minutes prior to chemotherapy. 30 g 6  . loperamide (IMODIUM) 2 MG capsule Take 2 mg by mouth as needed for diarrhea or loose stools.    . Multiple Vitamins-Minerals (MULTIVITAMIN ADULT PO) Take 1 tablet by mouth daily.    . ondansetron (ZOFRAN) 8 MG tablet One pill every 8 hours as needed for nausea/vomitting. 60 tablet 0  . potassium chloride SA (K-DUR,KLOR-CON) 20 MEQ tablet 1 pill twice a day (Patient taking differently: Take 20 mEq by mouth daily. ) 60 tablet 3  . prochlorperazine (COMPAZINE)  10 MG tablet Take 1 tablet (10 mg total) by mouth every 6 (six) hours as needed for nausea or vomiting. 90 tablet 3   No current facility-administered medications for this visit.    Facility-Administered Medications Ordered in Other Visits  Medication Dose Route Frequency Provider Last Rate Last Dose  . bevacizumab (AVASTIN) 250 mg in sodium chloride 0.9 % 100 mL chemo infusion  5 mg/kg (Treatment Plan Recorded) Intravenous Q14 Days Nancy Sickle, Nancy Clark      . dexamethasone (DECADRON) injection 10 mg  10 mg Intravenous Once Nancy Dalton Clark, Nancy Clark      . fluorouracil (ADRUCIL) 2,900 mg in sodium chloride 0.9 % 92 mL chemo infusion  2,000 mg/m2 (Treatment Plan Recorded) Intravenous 1 day or 1 dose Nancy Dalton Clark, Nancy Clark      . heparin lock flush 100 unit/mL  500 Units Intravenous Once Nancy Dalton Clark, Nancy Clark      . heparin lock flush 100 unit/mL  500 Units Intravenous Once Nancy Dalton Clark, Nancy Clark      . ondansetron [facility]) injection 8 mg  8 mg Intravenous Once Nancy Dalton Clark, Nancy Clark      . sodium chloride flush (NS) 0.9 % injection 10 mL  10 mL  Intravenous PRN Nancy Sickle, Nancy Clark   10 mL at 10/04/15 0901  . sodium chloride flush (NS) 0.9 % injection 10 mL  10 mL Intravenous PRN Nancy Sickle, Nancy Clark   10 mL at 07/02/17 1020  . sodium chloride flush (NS) 0.9 % injection 10 mL  10 mL Intravenous PRN Nancy Sickle, Nancy Clark        PHYSICAL EXAMINATION: ECOG PERFORMANCE STATUS: 1 - Symptomatic but completely ambulatory  BP 132/81 (BP Location: Left Arm, Patient Position: Sitting, Cuff Size: Small)   Pulse 73   Temp (!) 97 F (36.1 C) (Tympanic)   Resp 18   Ht 5' (1.524 m)   Wt 100 lb 6.4 oz (45.5 kg)   LMP  (LMP Unknown) Comment: LMP MORE THAN 5 YRS  BMI 19.61 kg/m   Filed Weights   09/30/18 1128  Weight: 100 lb 6.4 oz (45.5 kg)    Physical Exam  Constitutional: She is oriented to person, place, and time and well-developed, well-nourished, and in no distress.  She is alone. She is walking herself.  HENT:  Head: Normocephalic and atraumatic.  Mouth/Throat: Oropharynx is clear and moist. No oropharyngeal exudate.  Eyes: Pupils are equal, round, and reactive to light.  Neck: Normal range of motion. Neck supple.  Cardiovascular: Normal rate and regular rhythm.  Pulmonary/Chest: No respiratory distress. She has no wheezes.  Abdominal: Soft. Bowel sounds are normal. She exhibits no distension and no mass. There is no abdominal tenderness. There is no rebound and no guarding.  Musculoskeletal: Normal range of motion.        General: No tenderness or edema.  Neurological: She is alert and oriented to person, place, and time.  Skin: Skin is warm.  Psychiatric: Affect normal.       LABORATORY DATA:  I have reviewed the data as listed    Component Value Date/Time   NA 135 09/30/2018 1112   K 3.7 09/30/2018 1112   CL 103 09/30/2018 1112   CO2 24 09/30/2018 1112   GLUCOSE 121 (H) 09/30/2018 1112   BUN 11 09/30/2018 1112   CREATININE 0.84 09/30/2018 1112   CALCIUM 8.4 (L) 09/30/2018 1112   PROT 6.2 (L)  09/30/2018 1112   ALBUMIN 3.3 (L)  09/30/2018 1112   AST 15 09/30/2018 1112   ALT 8 09/30/2018 1112   ALKPHOS 49 09/30/2018 1112   BILITOT 0.4 09/30/2018 1112   GFRNONAA >60 09/30/2018 1112   GFRAA >60 09/30/2018 1112    No results found for: SPEP, UPEP  Lab Results  Component Value Date   WBC 8.8 09/30/2018   NEUTROABS 6.8 09/30/2018   HGB 10.7 (L) 09/30/2018   HCT 33.4 (L) 09/30/2018   MCV 96.3 09/30/2018   PLT 115 (L) 09/30/2018      Chemistry      Component Value Date/Time   NA 135 09/30/2018 1112   K 3.7 09/30/2018 1112   CL 103 09/30/2018 1112   CO2 24 09/30/2018 1112   BUN 11 09/30/2018 1112   CREATININE 0.84 09/30/2018 1112      Component Value Date/Time   CALCIUM 8.4 (L) 09/30/2018 1112   ALKPHOS 49 09/30/2018 1112   AST 15 09/30/2018 1112   ALT 8 09/30/2018 1112   BILITOT 0.4 09/30/2018 1112       RADIOGRAPHIC STUDIES: I have personally reviewed the radiological images as listed and agreed with the findings in the report. No results found.   ASSESSMENT & PLAN:  Cancer of ascending colon metastatic to intra-abdominal lymph node ([place]) # Colon cancer- cecal/ right-sided; STAGE IV; OCT 3rd 2019- CT- slight STABLE liver lesion; but MRI NOV 2019- Stable/improved liver lesion; increasing L-2 lesion; Feb 2020- MRI increasing lesion in liver ~1.8 cm [see below].  CEA -18; slowly rising.  # proceed with 5FU infusion only today; Labs today reviewed;  acceptable for treatment today.  Discussed with the patient that has a tumor marker slightly rising and concerned of continued progression of disease.  I have reached out to Dr. Earleen Newport interventional radiology regarding rescheduling for liver ablation.  Because that in general patient systemic therapy options are limited-reintroduction of irinotecan/oxaliplatin; TAS- 102/Stivarga.   # diarrhea- G-1-2; continue lomotil/ imodium-stable  #L2 metastases status post ablation [Jan third]-on fentanyl/hydrocodone as needed  stable.  Delton See currently on hold because of ongoing hypocalcemia.  # Hypokalemia- 3.7 stable.   # Hypocalcima-stable  # Peripheral neuropathy -stable  # DISPOSITION:  # Treatment today; CIV 5FU; avastin    #  Follow up in  2 weeks; X- Nancy Clark/ labs- cbc/cmp/cea/UA chemo-5FU pump-avastin; pump off in 2 days- Dr.B   No orders of the defined types were placed in this encounter.  All questions were answered. The patient knows to call the clinic with any problems, questions or concerns.      Nancy Sickle, Nancy Clark 09/30/2018 12:23 PM

## 2018-09-30 NOTE — Telephone Encounter (Signed)
Left vm for the patient - [id number]

## 2018-09-30 NOTE — Addendum Note (Signed)
Addended by: Sandria Bales B on: 09/30/2018 01:06 PM   Modules accepted: Orders

## 2018-09-30 NOTE — Telephone Encounter (Signed)
#   Discussed with Dr.Wagner; will order CT scan a/p for re-evaluation of liver metastases. NO contraindications with Avastin. Dr.Wagner plans to schedule the liver ablation in next few weeks. IR will contact for her liver ablation.   # Heather/brooke- please inform pt of the above plan.   # follow up as planned.   GB

## 2018-10-01 LAB — CEA: CEA: 20 ng/mL — ABNORMAL HIGH (ref 0.0–4.7)

## 2018-10-02 ENCOUNTER — Other Ambulatory Visit: Payer: Self-pay

## 2018-10-02 ENCOUNTER — Inpatient Hospital Stay: Payer: BC Managed Care – PPO

## 2018-10-02 DIAGNOSIS — C182 Malignant neoplasm of ascending colon: Secondary | ICD-10-CM | POA: Diagnosis not present

## 2018-10-02 DIAGNOSIS — C772 Secondary and unspecified malignant neoplasm of intra-abdominal lymph nodes: Secondary | ICD-10-CM

## 2018-10-02 MED ORDER — SODIUM CHLORIDE 0.9% FLUSH
10.0000 mL | INTRAVENOUS | Status: DC | PRN
  Administered 2018-10-02: 10 mL
  Filled 2018-10-02: qty 10

## 2018-10-02 MED ORDER — HEPARIN SOD (PORK) LOCK FLUSH 100 UNIT/ML IV SOLN
500.0000 [IU] | Freq: Once | INTRAVENOUS | Status: AC | PRN
  Administered 2018-10-02: 500 [IU]

## 2018-10-15 ENCOUNTER — Encounter: Payer: Self-pay | Admitting: Oncology

## 2018-10-15 ENCOUNTER — Inpatient Hospital Stay: Payer: BC Managed Care – PPO

## 2018-10-15 ENCOUNTER — Inpatient Hospital Stay (HOSPITAL_BASED_OUTPATIENT_CLINIC_OR_DEPARTMENT_OTHER): Payer: BC Managed Care – PPO | Admitting: Oncology

## 2018-10-15 ENCOUNTER — Other Ambulatory Visit: Payer: Self-pay

## 2018-10-15 VITALS — BP 97/65 | HR 77 | Temp 96.3°F | Resp 18 | Wt 97.3 lb

## 2018-10-15 VITALS — BP 130/83 | HR 75

## 2018-10-15 DIAGNOSIS — C182 Malignant neoplasm of ascending colon: Secondary | ICD-10-CM | POA: Diagnosis not present

## 2018-10-15 DIAGNOSIS — R197 Diarrhea, unspecified: Secondary | ICD-10-CM

## 2018-10-15 DIAGNOSIS — C7951 Secondary malignant neoplasm of bone: Secondary | ICD-10-CM

## 2018-10-15 DIAGNOSIS — E876 Hypokalemia: Secondary | ICD-10-CM | POA: Diagnosis not present

## 2018-10-15 DIAGNOSIS — Z87891 Personal history of nicotine dependence: Secondary | ICD-10-CM

## 2018-10-15 DIAGNOSIS — C772 Secondary and unspecified malignant neoplasm of intra-abdominal lymph nodes: Secondary | ICD-10-CM

## 2018-10-15 DIAGNOSIS — D649 Anemia, unspecified: Secondary | ICD-10-CM

## 2018-10-15 DIAGNOSIS — G893 Neoplasm related pain (acute) (chronic): Secondary | ICD-10-CM | POA: Diagnosis not present

## 2018-10-15 DIAGNOSIS — Z5111 Encounter for antineoplastic chemotherapy: Secondary | ICD-10-CM

## 2018-10-15 LAB — COMPREHENSIVE METABOLIC PANEL
ALT: 8 U/L (ref 0–44)
AST: 15 U/L (ref 15–41)
Albumin: 3.5 g/dL (ref 3.5–5.0)
Alkaline Phosphatase: 50 U/L (ref 38–126)
Anion gap: 7 (ref 5–15)
BUN: 12 mg/dL (ref 6–20)
CO2: 24 mmol/L (ref 22–32)
Calcium: 8.3 mg/dL — ABNORMAL LOW (ref 8.9–10.3)
Chloride: 106 mmol/L (ref 98–111)
Creatinine, Ser: 1.01 mg/dL — ABNORMAL HIGH (ref 0.44–1.00)
GFR calc Af Amer: >60 mL/min (ref >60)
GFR calc non Af Amer: >60 mL/min (ref >60)
Glucose, Bld: 93 mg/dL (ref 70–99)
Potassium: 3.4 mmol/L — ABNORMAL LOW (ref 3.5–5.1)
Sodium: 137 mmol/L (ref 135–145)
Total Bilirubin: 0.4 mg/dL (ref 0.3–1.2)
Total Protein: 6.3 g/dL — ABNORMAL LOW (ref 6.5–8.1)

## 2018-10-15 LAB — URINALYSIS, COMPLETE (UACMP) WITH MICROSCOPIC
Bacteria, UA: NONE SEEN
Bilirubin Urine: NEGATIVE
Glucose, UA: NEGATIVE mg/dL
Hgb urine dipstick: NEGATIVE
Ketones, ur: NEGATIVE mg/dL
Leukocytes,Ua: NEGATIVE
Nitrite: NEGATIVE
Protein, ur: NEGATIVE mg/dL
Specific Gravity, Urine: 1.012 (ref 1.005–1.030)
pH: 5 (ref 5.0–8.0)

## 2018-10-15 LAB — CBC WITH DIFFERENTIAL/PLATELET
Abs Immature Granulocytes: 0.03 10*3/uL (ref 0.00–0.07)
Basophils Absolute: 0.1 10*3/uL (ref 0.0–0.1)
Basophils Relative: 1 %
Eosinophils Absolute: 0.2 10*3/uL (ref 0.0–0.5)
Eosinophils Relative: 2 %
HCT: 33.9 % — ABNORMAL LOW (ref 36.0–46.0)
Hemoglobin: 10.8 g/dL — ABNORMAL LOW (ref 12.0–15.0)
Immature Granulocytes: 0 %
Lymphocytes Relative: 16 %
Lymphs Abs: 1.1 10*3/uL (ref 0.7–4.0)
MCH: 30.3 pg (ref 26.0–34.0)
MCHC: 31.9 g/dL (ref 30.0–36.0)
MCV: 95 fL (ref 80.0–100.0)
Monocytes Absolute: 0.6 10*3/uL (ref 0.1–1.0)
Monocytes Relative: 9 %
Neutro Abs: 4.9 10*3/uL (ref 1.7–7.7)
Neutrophils Relative %: 72 %
Platelets: 138 10*3/uL — ABNORMAL LOW (ref 150–400)
RBC: 3.57 MIL/uL — ABNORMAL LOW (ref 3.87–5.11)
RDW: 16.3 % — ABNORMAL HIGH (ref 11.5–15.5)
WBC: 6.9 10*3/uL (ref 4.0–10.5)
nRBC: 0 % (ref 0.0–0.2)

## 2018-10-15 MED ORDER — SODIUM CHLORIDE 0.9% FLUSH
10.0000 mL | INTRAVENOUS | Status: DC | PRN
  Administered 2018-10-15: 10 mL via INTRAVENOUS
  Filled 2018-10-15: qty 10

## 2018-10-15 MED ORDER — SODIUM CHLORIDE 0.9 % IV SOLN
Freq: Once | INTRAVENOUS | Status: AC
  Administered 2018-10-15: 11:00:00 via INTRAVENOUS
  Filled 2018-10-15: qty 250

## 2018-10-15 MED ORDER — SODIUM CHLORIDE 0.9 % IV SOLN
2000.0000 mg/m2 | INTRAVENOUS | Status: DC
  Administered 2018-10-15: 2900 mg via INTRAVENOUS
  Filled 2018-10-15: qty 58

## 2018-10-15 MED ORDER — SODIUM CHLORIDE 0.9 % IV SOLN
220.0000 mg | INTRAVENOUS | Status: DC
  Administered 2018-10-15: 12:00:00 225 mg via INTRAVENOUS
  Filled 2018-10-15: qty 9

## 2018-10-15 MED ORDER — ONDANSETRON HCL 4 MG/2ML IJ SOLN
8.0000 mg | Freq: Once | INTRAMUSCULAR | Status: AC
  Administered 2018-10-15: 8 mg via INTRAVENOUS
  Filled 2018-10-15: qty 4

## 2018-10-15 MED ORDER — DEXAMETHASONE SODIUM PHOSPHATE 10 MG/ML IJ SOLN
10.0000 mg | Freq: Once | INTRAMUSCULAR | Status: AC
  Administered 2018-10-15: 10 mg via INTRAVENOUS
  Filled 2018-10-15: qty 1

## 2018-10-15 MED ORDER — SODIUM CHLORIDE 0.9 % IV SOLN: 5.0000 mg/kg | INTRAVENOUS | Status: DC

## 2018-10-15 MED ORDER — FENTANYL 25 MCG/HR TD PT72: 1.0000 | MEDICATED_PATCH | TRANSDERMAL | 0 refills | Status: DC

## 2018-10-15 NOTE — Progress Notes (Signed)
Per Dr. Tasia Catchings, proceed with treatment, pt to receive 1L of NS over one hour.

## 2018-10-15 NOTE — Progress Notes (Signed)
Patient here for follow up. Requesting refill on fentanyl patches, only has 1 left.

## 2018-10-15 NOTE — Progress Notes (Signed)
Hematology/Oncology Follow Up Note [facility]  [phone] [phone]  Patient Care Team: Coral Spikes, DO as PCP - General (Family Medicine)   Name of the patient: Nancy Clark  [id number]  1961/02/19   REASON FOR VISIT  follow-up for evaluation prior to chemotherapy treatment for metastatic colon cancer.  PERTINENT ONCOLOGY HISTORY Patient follows up with Dr.Brahmanday for oncology care.  I am covering Dr. B who is working remotely this week to evaluate this patient. Chart review was performed. Colon cancer was diagnosed in 2017, stage IV status post right hemicolectomy, pT4a N2,  K-ras exon 2 mutated, MSI stable. April 2017 PET scan mesenteric adenopathy/mediastinal adenopathy/right-sided subclavicular lymph node. #April 2017 first-line chemotherapy FOLFOX plus Avastin, oxaliplatin discontinued in August 2017 due to poor tolerance.  Maintenance 5-FU Avastin every 3 weeks #June July 2019 disease progression to liver/abdominal lymph node.  Started on second line FOLFIRI plus Avastin which patient did not tolerate secondary to diarrhea.  Switched back to a Avastin 5-FU. #March 2018, L2 uptake metastasis status post radiation.  Delton See #January 2020 L1 ablation/osteo-cool    INTERVAL HISTORY 58 year old female with history of metastatic adenocarcinoma of the colon present for evaluation prior to 5-FU/Avastin treatment. She reports having intermittent diarrhea average 2-3 episodes a day.  She takes Imodium as instructed. She has chronic right low back pain for which she uses fentanyl patches.  Reports overall pain is well controlled.  Today pain level at a 3 out of 10.  Occasionally she does have breakthrough pain and she asked if she can use Norco. Appetite is fair.  Patient has lost 3 pounds since last visit. No nausea or vomiting. She has liver lesion ablation scheduled on 11/06/2018.   Review of Systems  Constitutional: Positive for  fatigue and unexpected weight change. Negative for appetite change, chills and fever.  HENT:   Negative for hearing loss and voice change.   Eyes: Negative for eye problems.  Respiratory: Negative for chest tightness and cough.   Cardiovascular: Negative for chest pain.  Gastrointestinal: Positive for diarrhea. Negative for abdominal distention, abdominal pain and blood in stool.  Endocrine: Negative for hot flashes.  Genitourinary: Negative for difficulty urinating and frequency.   Musculoskeletal: Positive for back pain. Negative for arthralgias.  Skin: Negative for itching and rash.  Neurological: Negative for extremity weakness.  Hematological: Negative for adenopathy.  Psychiatric/Behavioral: Negative for confusion.      No Known Allergies   Past Medical History:  Diagnosis Date  . Chicken pox   . Colon cancer ([place])    Partial colectomy 07/2015 + chemo tx's  . Hypertension   . Hypokalemia   . Menopause    > 5 yrs  . Peripheral neuropathy due to chemotherapy [facility])      Past Surgical History:  Procedure Laterality Date  . BREAST BIOPSY Right    2007? pt unsure done by [facility]  . COLONOSCOPY    . COLONOSCOPY WITH PROPOFOL N/A 10/14/2015   Procedure: COLONOSCOPY WITH PROPOFOL;  Surgeon: Hulen Luster, MD;  Location: [facility] ENDOSCOPY;  Service: Endoscopy;  Laterality: N/A;  . COLOSTOMY REVISION Right 08/09/2015   Procedure: COLON RESECTION RIGHT;  Surgeon: Florene Glen, MD;  Location: [facility] ORS;  Service: General;  Laterality: Right;  . ECTOPIC PREGNANCY SURGERY    . IR BONE TUMOR(S)RF ABLATION  05/24/2018  . IR KYPHO LUMBAR INC FX REDUCE BONE BX UNI/BIL CANNULATION INC/IMAGING  05/24/2018  . IR RADIOLOGIST EVAL & MGMT  05/08/2018  . IR RADIOLOGIST EVAL & MGMT  06/18/2018  . IR RADIOLOGIST EVAL & MGMT  07/31/2018  . PORTACATH PLACEMENT N/A 09/01/2015   Procedure: INSERTION PORT-A-CATH;  Surgeon: Richard E Cooper, MD;  Location: [facility] ORS;  Service: General;  Laterality: N/A;  .  SHOULDER SURGERY Left     Social History   Socioeconomic History  . Marital status: Married    Spouse name: Not on file  . Number of children: Not on file  . Years of education: Not on file  . Highest education level: Not on file  Occupational History  . Not on file  Social Needs  . Financial resource strain: Not on file  . Food insecurity:    Worry: Not on file    Inability: Not on file  . Transportation needs:    Medical: Not on file    Non-medical: Not on file  Tobacco Use  . Smoking status: Former Smoker    Packs/day: 0.25    Years: 2.00    Pack years: 0.50  . Smokeless tobacco: Never Used  . Tobacco comment: recently quit  Substance and Sexual Activity  . Alcohol use: No    Alcohol/week: 0.0 standard drinks  . Drug use: No  . Sexual activity: Yes  Lifestyle  . Physical activity:    Days per week: Not on file    Minutes per session: Not on file  . Stress: Not on file  Relationships  . Social connections:    Talks on phone: Not on file    Gets together: Not on file    Attends religious service: Not on file    Active member of club or organization: Not on file    Attends meetings of clubs or organizations: Not on file    Relationship status: Not on file  . Intimate partner violence:    Fear of current or ex partner: Not on file    Emotionally abused: Not on file    Physically abused: Not on file    Forced sexual activity: Not on file  Other Topics Concern  . Not on file  Social History Narrative  . Not on file    Family History  Problem Relation Age of Onset  . Hypertension Mother   . Arthritis Father   . Prostate cancer Father   . Diabetes Maternal Grandmother   . Colon cancer Maternal Grandfather        colon     Current Outpatient Medications:  .  acetaminophen (TYLENOL) 325 MG tablet, Take 2 tablets (650 mg total) by mouth every 6 (six) hours as needed for mild pain (or Fever >/= 101)., Disp: , Rfl:  .  benazepril (LOTENSIN) 20 MG tablet,  TAKE 1 TABLET DAILY (Patient taking differently: Take 20 mg by mouth daily. ), Disp: 90 tablet, Rfl: 4 .  Calcium Carbonate-Vitamin D (CALCIUM 600/VITAMIN D PO), Take 1 tablet by mouth daily., Disp: , Rfl:  .  diphenoxylate-atropine (LOMOTIL) 2.5-0.025 MG tablet, Take 1 tablet by mouth 4 (four) times daily as needed for diarrhea or loose stools. Take it along with immodium, Disp: 40 tablet, Rfl: 4 .  DULoxetine (CYMBALTA) 20 MG capsule, Take 2 capsules (40 mg total) by mouth daily. (Patient taking differently: Take 20 mg by mouth 2 (two) times daily. ), Disp: 180 capsule, Rfl: 3 .  feeding supplement (BOOST HIGH PROTEIN) LIQD, Take 1 Container by mouth daily. , Disp: , Rfl:  .  fentaNYL (DURAGESIC) 25 MCG/HR, Place 1 patch   onto the skin every 3 (three) days., Disp: 10 patch, Rfl: 0 .  ferrous sulfate (FERROUSUL) 325 (65 FE) MG tablet, Take 1 tablet (325 mg total) by mouth daily with breakfast., Disp: 90 tablet, Rfl: 3 .  gabapentin (NEURONTIN) 300 MG capsule, One pill in am; and 2 pills at night prior to sleep., Disp: 90 capsule, Rfl: 3 .  hydrochlorothiazide (MICROZIDE) 12.5 MG capsule, Take 1 capsule (12.5 mg total) by mouth daily., Disp: 30 capsule, Rfl: 6 .  HYDROcodone-acetaminophen (NORCO/VICODIN) 5-325 MG tablet, Take 1 tablet by mouth every 12 (twelve) hours as needed for moderate pain., Disp: 60 tablet, Rfl: 0 .  lidocaine (LIDODERM) 5 %, Place 1 patch onto the skin daily. Remove & Discard patch within 12 hours or as directed by MD, Disp: 30 patch, Rfl: 2 .  lidocaine-prilocaine (EMLA) cream, Apply 1 application topically as needed. Apply generously over the Mediport 45 minutes prior to chemotherapy., Disp: 30 g, Rfl: 6 .  loperamide (IMODIUM) 2 MG capsule, Take 2 mg by mouth as needed for diarrhea or loose stools., Disp: , Rfl:  .  Multiple Vitamins-Minerals (MULTIVITAMIN ADULT PO), Take 1 tablet by mouth daily., Disp: , Rfl:  .  ondansetron (ZOFRAN) 8 MG tablet, One pill every 8 hours as  needed for nausea/vomitting., Disp: 60 tablet, Rfl: 0 .  potassium chloride SA (K-DUR,KLOR-CON) 20 MEQ tablet, 1 pill twice a day (Patient taking differently: Take 20 mEq by mouth daily. ), Disp: 60 tablet, Rfl: 3 .  prochlorperazine (COMPAZINE) 10 MG tablet, Take 1 tablet (10 mg total) by mouth every 6 (six) hours as needed for nausea or vomiting., Disp: 90 tablet, Rfl: 3 No current facility-administered medications for this visit.   Facility-Administered Medications Ordered in Other Visits:  .  heparin lock flush 100 unit/mL, 500 Units, Intravenous, Once, Brahmanday, Govinda R, MD .  sodium chloride flush (NS) 0.9 % injection 10 mL, 10 mL, Intravenous, PRN, Brahmanday, Govinda R, MD, 10 mL at 10/04/15 0901 .  sodium chloride flush (NS) 0.9 % injection 10 mL, 10 mL, Intravenous, PRN, Brahmanday, Govinda R, MD, 10 mL at 07/02/17 1020  Physical exam:  Vitals:   10/15/18 0952  BP: 97/65  Pulse: 77  Resp: 18  Temp: (!) 96.3 F (35.7 C)  TempSrc: Tympanic  Weight: 97 lb 4.8 oz (44.1 kg)   Physical Exam Constitutional:      General: She is not in acute distress.    Comments: Thin, walk in   HENT:     Head: Normocephalic and atraumatic.  Eyes:     General: No scleral icterus.    Pupils: Pupils are equal, round, and reactive to light.  Neck:     Musculoskeletal: Normal range of motion and neck supple.  Cardiovascular:     Rate and Rhythm: Normal rate and regular rhythm.     Heart sounds: Normal heart sounds.  Pulmonary:     Effort: Pulmonary effort is normal. No respiratory distress.     Breath sounds: No wheezing.  Abdominal:     General: Bowel sounds are normal. There is no distension.     Palpations: Abdomen is soft. There is no mass.     Tenderness: There is no abdominal tenderness.  Musculoskeletal: Normal range of motion.        General: No deformity.  Skin:    General: Skin is warm and dry.     Findings: No erythema or rash.  Neurological:     Mental Status: She is    alert and oriented to person, place, and time.     Cranial Nerves: No cranial nerve deficit.     Coordination: Coordination normal.  Psychiatric:        Behavior: Behavior normal.        Thought Content: Thought content normal.     CMP Latest Ref Rng & Units 10/15/2018  Glucose 70 - 99 mg/dL 93  BUN 6 - 20 mg/dL 12  Creatinine 0.44 - 1.00 mg/dL 1.01(H)  Sodium 135 - 145 mmol/L 137  Potassium 3.5 - 5.1 mmol/L 3.4(L)  Chloride 98 - 111 mmol/L 106  CO2 22 - 32 mmol/L 24  Calcium 8.9 - 10.3 mg/dL 8.3(L)  Total Protein 6.5 - 8.1 g/dL 6.3(L)  Total Bilirubin 0.3 - 1.2 mg/dL 0.4  Alkaline Phos 38 - 126 U/L 50  AST 15 - 41 U/L 15  ALT 0 - 44 U/L 8   CBC Latest Ref Rng & Units 10/15/2018  WBC 4.0 - 10.5 K/uL 6.9  Hemoglobin 12.0 - 15.0 g/dL 10.8(L)  Hematocrit 36.0 - 46.0 % 33.9(L)  Platelets 150 - 400 K/uL 138(L)    RADIOGRAPHIC STUDIES: I have personally reviewed the radiological images as listed and agreed with the findings in the report. 02/22/2018, CT abdomen pelvis with contrast 1 slight increase in size of the worrisome rim-enhancing hypodense lesion in the right hepatic lobe currently 1.2 x 1.2 cm previously 1.0 x 0.9 cm.  2 stable indistinct density along the reviewed of the mesentery, probably from prior treated tumor, not appreciate any changed from 11/02/2017.  Posterior to this is some hazy periodic density which is likewise probably result of prior regional radiation. 3 left side L2 vertebral body which is similar to prior exam which could be from radiation osteonecrosis or prior metastatic lesion.   Assessment and plan Patient is a 57 y.o. female who has stage IV colon cancer present for evaluation prior to maintenance 5-FU and Avastin. 1. Cancer of ascending colon metastatic to intra-abdominal lymph node (HCC)   2. Diarrhea, unspecified type   3. Encounter for antineoplastic chemotherapy   4. Neoplasm related pain   5. Hypokalemia   6. Anemia, unspecified type     Stage IV colon cancer, Labs are reviewed and discussed with patient. Counts acceptable to proceed with 5-FU and Avastin. She has an appointment with IR for liver lesion ablation in June 2020.  Chronic diarrhea, advised patient to continue utilize Imodium as instructed. Encourage oral hydration. Patient's creatinine slightly above her baseline.  Likely due to mild dehydration.  I will give 1 L of IV fluid along with her chemotherapy treatment today. Neoplasm related pain, on fentanyl patch 25 MCG every 72 hours.  Reports symptoms well controlled except sometimes she will have breakthrough pain.  She may use Norco 1 tablet every 12 hours as needed for breakthrough pain. Fentanyl refill prescription was sent to pharmacy.  Dispense 10 patches.  Hypokalemia, advised patient to continue potassium supplementation.  Also encourage patient to eat potassium rich food. Chronic anemia, hemoglobin stable at 10.8.  Continue to monitor.  Orders Placed This Encounter  Procedures  . SCHEDULING COMMUNICATION    Chemotherapy Appointment - 4 hours    Follow-up in 2 weeks with Dr. Brahmanday for lab MD assessment prior to next cycle of 5-FU/Avastin.   Zhou Yu, MD, PhD Hematology Oncology [facility] Pager- [phone] 10/15/2018  

## 2018-10-16 LAB — CEA: CEA: 25.2 ng/mL — ABNORMAL HIGH (ref 0.0–4.7)

## 2018-10-17 ENCOUNTER — Inpatient Hospital Stay: Payer: BC Managed Care – PPO

## 2018-10-17 ENCOUNTER — Other Ambulatory Visit: Payer: Self-pay

## 2018-10-17 VITALS — BP 129/78 | HR 61 | Resp 17

## 2018-10-17 DIAGNOSIS — C182 Malignant neoplasm of ascending colon: Secondary | ICD-10-CM

## 2018-10-17 DIAGNOSIS — C772 Secondary and unspecified malignant neoplasm of intra-abdominal lymph nodes: Secondary | ICD-10-CM

## 2018-10-17 MED ORDER — SODIUM CHLORIDE 0.9% FLUSH
10.0000 mL | INTRAVENOUS | Status: DC | PRN
  Administered 2018-10-17: 10 mL
  Filled 2018-10-17: qty 10

## 2018-10-17 MED ORDER — HEPARIN SOD (PORK) LOCK FLUSH 100 UNIT/ML IV SOLN
500.0000 [IU] | Freq: Once | INTRAVENOUS | Status: AC | PRN
  Administered 2018-10-17: 500 [IU]
  Filled 2018-10-17: qty 5

## 2018-10-19 ENCOUNTER — Encounter: Payer: Self-pay | Admitting: Oncology

## 2018-10-19 DIAGNOSIS — Z7189 Other specified counseling: Secondary | ICD-10-CM | POA: Insufficient documentation

## 2018-10-28 ENCOUNTER — Inpatient Hospital Stay: Payer: BC Managed Care – PPO | Attending: Internal Medicine

## 2018-10-28 ENCOUNTER — Inpatient Hospital Stay: Payer: BC Managed Care – PPO

## 2018-10-28 ENCOUNTER — Inpatient Hospital Stay (HOSPITAL_BASED_OUTPATIENT_CLINIC_OR_DEPARTMENT_OTHER): Payer: BC Managed Care – PPO | Admitting: Internal Medicine

## 2018-10-28 ENCOUNTER — Other Ambulatory Visit: Payer: Self-pay

## 2018-10-28 ENCOUNTER — Other Ambulatory Visit: Payer: Self-pay | Admitting: Internal Medicine

## 2018-10-28 VITALS — BP 99/66 | HR 72 | Temp 97.2°F | Resp 18 | Ht 60.0 in | Wt 95.6 lb

## 2018-10-28 DIAGNOSIS — C7951 Secondary malignant neoplasm of bone: Secondary | ICD-10-CM

## 2018-10-28 DIAGNOSIS — C787 Secondary malignant neoplasm of liver and intrahepatic bile duct: Secondary | ICD-10-CM

## 2018-10-28 DIAGNOSIS — C772 Secondary and unspecified malignant neoplasm of intra-abdominal lymph nodes: Secondary | ICD-10-CM

## 2018-10-28 DIAGNOSIS — Z5112 Encounter for antineoplastic immunotherapy: Secondary | ICD-10-CM | POA: Diagnosis present

## 2018-10-28 DIAGNOSIS — Z87891 Personal history of nicotine dependence: Secondary | ICD-10-CM | POA: Diagnosis not present

## 2018-10-28 DIAGNOSIS — G8929 Other chronic pain: Secondary | ICD-10-CM | POA: Insufficient documentation

## 2018-10-28 DIAGNOSIS — C182 Malignant neoplasm of ascending colon: Secondary | ICD-10-CM | POA: Diagnosis not present

## 2018-10-28 DIAGNOSIS — Z5111 Encounter for antineoplastic chemotherapy: Secondary | ICD-10-CM | POA: Insufficient documentation

## 2018-10-28 DIAGNOSIS — I10 Essential (primary) hypertension: Secondary | ICD-10-CM | POA: Diagnosis not present

## 2018-10-28 DIAGNOSIS — Z95828 Presence of other vascular implants and grafts: Secondary | ICD-10-CM

## 2018-10-28 DIAGNOSIS — Z79899 Other long term (current) drug therapy: Secondary | ICD-10-CM

## 2018-10-28 LAB — CBC WITH DIFFERENTIAL/PLATELET
Abs Immature Granulocytes: 0.03 10*3/uL (ref 0.00–0.07)
Basophils Absolute: 0 10*3/uL (ref 0.0–0.1)
Basophils Relative: 1 %
Eosinophils Absolute: 0.2 10*3/uL (ref 0.0–0.5)
Eosinophils Relative: 3 %
HCT: 34.6 % — ABNORMAL LOW (ref 36.0–46.0)
Hemoglobin: 11 g/dL — ABNORMAL LOW (ref 12.0–15.0)
Immature Granulocytes: 0 %
Lymphocytes Relative: 18 %
Lymphs Abs: 1.3 10*3/uL (ref 0.7–4.0)
MCH: 30.2 pg (ref 26.0–34.0)
MCHC: 31.8 g/dL (ref 30.0–36.0)
MCV: 95.1 fL (ref 80.0–100.0)
Monocytes Absolute: 0.7 10*3/uL (ref 0.1–1.0)
Monocytes Relative: 10 %
Neutro Abs: 4.9 10*3/uL (ref 1.7–7.7)
Neutrophils Relative %: 68 %
Platelets: 110 10*3/uL — ABNORMAL LOW (ref 150–400)
RBC: 3.64 MIL/uL — ABNORMAL LOW (ref 3.87–5.11)
RDW: 16 % — ABNORMAL HIGH (ref 11.5–15.5)
WBC: 7.2 10*3/uL (ref 4.0–10.5)
nRBC: 0 % (ref 0.0–0.2)

## 2018-10-28 LAB — URINALYSIS, COMPLETE (UACMP) WITH MICROSCOPIC
Bacteria, UA: NONE SEEN
Bilirubin Urine: NEGATIVE
Glucose, UA: NEGATIVE mg/dL
Hgb urine dipstick: NEGATIVE
Ketones, ur: NEGATIVE mg/dL
Leukocytes,Ua: NEGATIVE
Nitrite: NEGATIVE
Protein, ur: NEGATIVE mg/dL
Specific Gravity, Urine: 1.01 (ref 1.005–1.030)
pH: 5 (ref 5.0–8.0)

## 2018-10-28 LAB — COMPREHENSIVE METABOLIC PANEL
ALT: 9 U/L (ref 0–44)
AST: 12 U/L — ABNORMAL LOW (ref 15–41)
Albumin: 3.4 g/dL — ABNORMAL LOW (ref 3.5–5.0)
Alkaline Phosphatase: 42 U/L (ref 38–126)
Anion gap: 8 (ref 5–15)
BUN: 9 mg/dL (ref 6–20)
CO2: 21 mmol/L — ABNORMAL LOW (ref 22–32)
Calcium: 8.6 mg/dL — ABNORMAL LOW (ref 8.9–10.3)
Chloride: 109 mmol/L (ref 98–111)
Creatinine, Ser: 0.95 mg/dL (ref 0.44–1.00)
GFR calc Af Amer: >60 mL/min (ref >60)
GFR calc non Af Amer: >60 mL/min (ref >60)
Glucose, Bld: 94 mg/dL (ref 70–99)
Potassium: 3.9 mmol/L (ref 3.5–5.1)
Sodium: 138 mmol/L (ref 135–145)
Total Bilirubin: 0.4 mg/dL (ref 0.3–1.2)
Total Protein: 6.2 g/dL — ABNORMAL LOW (ref 6.5–8.1)

## 2018-10-28 MED ORDER — SODIUM CHLORIDE 0.9 % IV SOLN: 5.0000 mg/kg | INTRAVENOUS | Status: DC

## 2018-10-28 MED ORDER — SODIUM CHLORIDE 0.9 % IV SOLN
225.0000 mg | Freq: Once | INTRAVENOUS | Status: AC
  Administered 2018-10-28: 225 mg via INTRAVENOUS
  Filled 2018-10-28: qty 9

## 2018-10-28 MED ORDER — SODIUM CHLORIDE 0.9 % IV SOLN
Freq: Once | INTRAVENOUS | Status: AC
  Administered 2018-10-28: 11:00:00 via INTRAVENOUS
  Filled 2018-10-28: qty 250

## 2018-10-28 MED ORDER — SODIUM CHLORIDE 0.9% FLUSH
10.0000 mL | Freq: Once | INTRAVENOUS | Status: AC
  Administered 2018-10-28: 10 mL via INTRAVENOUS
  Filled 2018-10-28: qty 10

## 2018-10-28 MED ORDER — ATROPINE SULFATE 1 MG/ML IJ SOLN
0.5000 mg | Freq: Once | INTRAMUSCULAR | Status: AC | PRN
  Administered 2018-10-28: 0.5 mg via INTRAVENOUS
  Filled 2018-10-28: qty 1

## 2018-10-28 MED ORDER — SODIUM CHLORIDE 0.9 % IV SOLN
2000.0000 mg/m2 | INTRAVENOUS | Status: DC
  Administered 2018-10-28: 2900 mg via INTRAVENOUS
  Filled 2018-10-28: qty 58

## 2018-10-28 MED ORDER — DEXAMETHASONE SODIUM PHOSPHATE 10 MG/ML IJ SOLN
10.0000 mg | Freq: Once | INTRAMUSCULAR | Status: AC
  Administered 2018-10-28: 10 mg via INTRAVENOUS
  Filled 2018-10-28: qty 1

## 2018-10-28 MED ORDER — ONDANSETRON HCL 4 MG/2ML IJ SOLN
8.0000 mg | Freq: Once | INTRAMUSCULAR | Status: AC
  Administered 2018-10-28: 8 mg via INTRAVENOUS
  Filled 2018-10-28: qty 4

## 2018-10-28 NOTE — Progress Notes (Signed)
Weldon Inches today per MD. Kennith Center, Pharm.D., CPP 10/28/2018@11 :08 AM

## 2018-10-28 NOTE — Progress Notes (Signed)
[place] OFFICE PROGRESS NOTE  Patient Care Team: Coral Spikes, DO as PCP - General (Family Medicine)  Cancer Staging No matching staging information was found for the patient.   Oncology History   # [facility] 2017- COLON CANCER- STAGE IV [s/p right hemi-colectomy; pT4apN2 (7/19LN)]; Pre-op CEA- [id number]  PET-  mesenteric adenopathy/mediastinal adenopathy/right-sided subclavicular lymph node.    April 17th -START FOLFOX; May 1st Add Avastin;   Aug 16th- Disc OX [sec PN]; con 5FU-Avastin; NOV 27th CT C/A/P- CR; except for 63m LUL; 5FU-Avastin q 3 W [Poor tolerance to FOLFOX].  # June/July 2019- Progression of Liver/Abd LN; July 2019- START FOLFIRI + Avastin; STOP iri 02/25/2018- sec to diarrhea; cont Avastin + 5FU  # March 2018- L2 uptake MET s/p RT [[facility]]; mid April X-geva  # L1 ablation/[facility]-  Jan 3rd 2020-   # march 2nd 2020- decrease 5FU LET [place] CUV  to 20052mm2/   # MOLECULAR TESTING: K-ras-EXON 2- MUTATED; MSI- STABLE.   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  female pleasant patient above history of metastatic adenocarcinoma of the colon currently on 5-FU-avastin is here for follow-up.  Denies any nausea vomiting.  Denies abdominal pain.  Denies any significant weight loss.  Continues to chronic back pain.  No worsening tingling and numbness in extremities.  Patient complains of mild to moderate hoarseness of voice.  Denies any cough or any fevers.  Denies any sinus infection.  We try to reach her at last visit regarding getting a CT scan prior [place]  Patient states that she had some difficulty with her phone-did not check a message.  Review of Systems  Constitutional: Positive for malaise/fatigue. Negative  for chills, diaphoresis, fever and weight loss.  HENT: Negative for nosebleeds and sore throat.   Eyes: Negative for double vision.  Respiratory: Negative for cough, hemoptysis, sputum production, shortness of breath and wheezing.   Cardiovascular: Negative for chest pain, palpitations, orthopnea and leg swelling.  Gastrointestinal: Negative for abdominal pain, constipation, heartburn, melena, nausea and vomiting.  Genitourinary: Negative for dysuria, frequency and urgency.  Musculoskeletal: Positive for back pain. Negative for joint pain.  Skin: Negative.  Negative for itching and rash.  Neurological: Positive for tingling. Negative for dizziness, focal weakness, weakness and headaches.  Endo/Heme/Allergies: Does not bruise/bleed easily.  Psychiatric/Behavioral: Negative for depression. The patient is not nervous/anxious and does not have insomnia.       PAST MEDICAL HISTORY :  Past Medical History:  Diagnosis Date  . Chicken pox   . [facility] [place]   Partial colectomy 07/2015 + chemo tx's  . Hypertension   . Hypokalemia   . Menopause    > 5 yrs  . [facility]    PAST SURGICAL HISTORY :   Past Surgical History:  Procedure Laterality Date  . BREAST BIOPSY Right    2007? pt unsure done by [facility]  . COLONOSCOPY    . COLONOSCOPY WITH PROPOFOL N/A 10/14/2015   Procedure: COLONOSCOPY WITH PROPOFOL;  Surgeon: PaHulen LusterMD;  [facility];  Service: Endoscopy;  Laterality: N/A;  . COLOSTOMY REVISION Right 08/09/2015   Procedure: COLON RESECTION RIGHT;  Surgeon: RiFlorene GlenMD;  Location: [facility] ORS;  Service: General;  Laterality: Right;  . ECTOPIC PREGNANCY SURGERY    . IR BONE TUMOR(S)RF ABLATION  05/24/2018  . IR KYPHO LUMBAR INC FX REDUCE BONE BX UNI/BIL CANNULATION INC/IMAGING  05/24/2018  . IR RADIOLOGIST EVAL & MGMT  05/08/2018  . IR RADIOLOGIST EVAL & MGMT  06/18/2018  . IR RADIOLOGIST EVAL & MGMT  07/31/2018  . PORTACATH  PLACEMENT N/A 09/01/2015   Procedure: INSERTION PORT-A-CATH;  Surgeon: Florene Glen, MD;  Location: [facility] ORS;  Service: General;  Laterality: N/A;  . SHOULDER SURGERY Left     FAMILY HISTORY :   Family History  Problem Relation Age of Onset  . Hypertension Mother   . Arthritis Father   . Prostate cancer Father   . Diabetes Maternal Grandmother   . Colon cancer Maternal Grandfather        colon    SOCIAL HISTORY:   Social History   Tobacco Use  . Smoking status: Former Smoker    Packs/day: 0.25    Years: 2.00    Pack years: 0.50  . Smokeless tobacco: Never Used  . Tobacco comment: recently quit  Substance Use Topics  . Alcohol use: No    Alcohol/week: 0.0 standard drinks  . Drug use: No    ALLERGIES:  has No Known Allergies.  MEDICATIONS:  Current Outpatient Medications  Medication Sig Dispense Refill  . acetaminophen (TYLENOL) 325 MG tablet Take 2 tablets (650 mg total) by mouth every 6 (six) hours as needed for mild pain (or Fever >/= 101).    . benazepril (LOTENSIN) 20 MG tablet TAKE 1 TABLET DAILY (Patient taking differently: Take 20 mg by mouth daily. ) 90 tablet 4  . Calcium Carbonate-Vitamin D (CALCIUM 600/VITAMIN D PO) Take 1 tablet by mouth daily.    . diphenoxylate-atropine (LOMOTIL) 2.5-0.025 MG tablet Take 1 tablet by mouth 4 (four) times daily as needed for diarrhea or loose stools. Take it along with immodium 40 tablet 4  . DULoxetine (CYMBALTA) 20 MG capsule Take 2 capsules (40 mg total) by mouth daily. (Patient taking differently: Take 20 mg by mouth 2 (two) times daily. ) 180 capsule 3  . feeding supplement (BOOST HIGH PROTEIN) LIQD Take 1 Container by mouth daily.     . fentaNYL (DURAGESIC) 25 MCG/HR Place 1 patch onto the skin every 3 (three) days. 10 patch 0  . ferrous sulfate (FERROUSUL) 325 (65 FE) MG tablet Take 1 tablet (325 mg total) by mouth daily with breakfast. 90 tablet 3  . gabapentin (NEURONTIN) 300 MG capsule One pill in am; and 2 pills  at night prior to sleep. 90 capsule 3  . hydrochlorothiazide (MICROZIDE) 12.5 MG capsule Take 1 capsule (12.5 mg total) by mouth daily. 30 capsule 6  . lidocaine-prilocaine (EMLA) cream Apply 1 application topically as needed. Apply generously over the Mediport 45 minutes prior to chemotherapy. 30 g 6  . loperamide (IMODIUM) 2 MG capsule Take 2 mg by mouth as needed for diarrhea or loose stools.    . Multiple Vitamins-Minerals (MULTIVITAMIN ADULT PO) Take 1 tablet by mouth daily.    . ondansetron (ZOFRAN) 8 MG tablet One pill every 8 hours as needed for nausea/vomitting. 60 tablet 0  . potassium chloride SA (K-DUR,KLOR-CON) 20 MEQ tablet 1 pill twice a day (Patient taking differently: Take 20 mEq by mouth daily. ) 60 tablet 3  . prochlorperazine (COMPAZINE) 10 MG tablet Take 1 tablet (10 mg total) by mouth every 6 (six)  hours as needed for nausea or vomiting. 90 tablet 3  . HYDROcodone-acetaminophen (NORCO/VICODIN) 5-325 MG tablet Take 1 tablet by mouth every 12 (twelve) hours as needed for moderate pain. (Patient not taking: Reported on 10/28/2018) 60 tablet 0   No current facility-administered medications for this visit.    Facility-Administered Medications Ordered in Other Visits  Medication Dose Route Frequency Provider Last Rate Last Dose  . fluorouracil (ADRUCIL) 2,900 mg in sodium chloride 0.9 % 92 mL chemo infusion  2,000 mg/m2 (Treatment Plan Recorded) Intravenous 1 day or 1 dose Charlaine Dalton R, MD   2,900 mg at 10/28/18 1206  . heparin lock flush 100 unit/mL  500 Units Intravenous Once Charlaine Dalton R, MD      . sodium chloride flush (NS) 0.9 % injection 10 mL  10 mL Intravenous PRN Cammie Sickle, MD   10 mL at 10/04/15 0901  . sodium chloride flush (NS) 0.9 % injection 10 mL  10 mL Intravenous PRN Cammie Sickle, MD   10 mL at 07/02/17 1020    PHYSICAL EXAMINATION: ECOG PERFORMANCE STATUS: 1 - Symptomatic but completely ambulatory  BP 99/66 (BP Location:  Right Arm, Patient Position: Sitting, Cuff Size: Normal)   Pulse 72   Temp (!) 97.2 F (36.2 C) (Tympanic)   Resp 18   Ht 5' (1.524 m)   Wt 95 lb 9.6 oz (43.4 kg)   LMP  (LMP Unknown) Comment: LMP MORE THAN 5 YRS  BMI 18.67 kg/m   Filed Weights   10/28/18 0954  Weight: 95 lb 9.6 oz (43.4 kg)    Physical Exam  Constitutional: She is oriented to person, place, and time and well-developed, well-nourished, and in no distress.  She is alone. She is walking herself.  HENT:  Head: Normocephalic and atraumatic.  Mouth/Throat: Oropharynx is clear and moist. No oropharyngeal exudate.  Eyes: Pupils are equal, round, and reactive to light.  Neck: Normal range of motion. Neck supple.  Cardiovascular: Normal rate and regular rhythm.  Pulmonary/Chest: No respiratory distress. She has no wheezes.  Abdominal: Soft. Bowel sounds are normal. She exhibits no distension and no mass. There is no abdominal tenderness. There is no rebound and no guarding.  Musculoskeletal: Normal range of motion.        General: No tenderness or edema.  Neurological: She is alert and oriented to person, place, and time.  Skin: Skin is warm.  Psychiatric: Affect normal.       LABORATORY DATA:  I have reviewed the data as listed    Component Value Date/Time   NA 138 10/28/2018 0909   K 3.9 10/28/2018 0909   CL 109 10/28/2018 0909   CO2 21 (L) 10/28/2018 0909   GLUCOSE 94 10/28/2018 0909   BUN 9 10/28/2018 0909   CREATININE 0.95 10/28/2018 0909   CALCIUM 8.6 (L) 10/28/2018 0909   PROT 6.2 (L) 10/28/2018 0909   ALBUMIN 3.4 (L) 10/28/2018 0909   AST 12 (L) 10/28/2018 0909   ALT 9 10/28/2018 0909   ALKPHOS 42 10/28/2018 0909   BILITOT 0.4 10/28/2018 0909   GFRNONAA >60 10/28/2018 0909   GFRAA >60 10/28/2018 0909    No results found for: SPEP, UPEP  Lab Results  Component Value Date   WBC 7.2 10/28/2018   NEUTROABS 4.9 10/28/2018   HGB 11.0 (L) 10/28/2018   HCT 34.6 (L) 10/28/2018   MCV 95.1  10/28/2018   PLT 110 (L) 10/28/2018      Chemistry  Component Value Date/Time   NA 138 10/28/2018 0909   K 3.9 10/28/2018 0909   CL 109 10/28/2018 0909   CO2 21 (L) 10/28/2018 0909   BUN 9 10/28/2018 0909   CREATININE 0.95 10/28/2018 0909      Component Value Date/Time   CALCIUM 8.6 (L) 10/28/2018 0909   ALKPHOS 42 10/28/2018 0909   AST 12 (L) 10/28/2018 0909   ALT 9 10/28/2018 0909   BILITOT 0.4 10/28/2018 0909       RADIOGRAPHIC STUDIES: I have personally reviewed the radiological images as listed and agreed with the findings in the report. No results found.   ASSESSMENT & PLAN:  Cancer of ascending colon metastatic to intra-abdominal lymph node [facility]) # Colon cancer- cecal/ right-sided; STAGE IV; Feb 2020- MRI increasing lesion in liver ~1.8 cm [see below].  CEA -25 slowly rising; at 25.   # proceed with 5FU infusion only today; Labs today reviewed;  acceptable for treatment today.  Discussed with the patient that has a tumor marker slightly rising and concerned of continued progression of disease.  Will plan CT A/P ASAP; prior to IR ablation. If multiple lesions noted; would recommend change in systemic therapy TAS 102/ stivarga.   # diarrhea- G-1-2; continue lomotil/ imodium-stable  #L2 metastases status post ablation [Jan third]-on fentanyl/hydrocodone as needed stable.  # Hypokalemia- 3.7 stable.   # Hypocalcima stable.   # Peripheral neuropathy -stable.   # hoarseness of voive- try anti-histamine..   # DISPOSITION:  # Treatment today; CIV 5FU; avastin    # Schedule CT A/P asap.  #  Follow up in  2 weeks; MD/ labs- cbc/cmp/cea/UA chemo-5FU pump-avastin; pump off in 2 days- Dr.B     Orders Placed This Encounter  Procedures  . CBC with Differential    Standing Status:   Future    Standing Expiration Date:   10/28/2019  . Comprehensive metabolic panel    Standing Status:   Future    Standing Expiration Date:   10/28/2019  . CEA    Standing Status:    Future    Standing Expiration Date:   10/28/2019  . Urinalysis, Complete w Microscopic    Standing Status:   Future    Standing Expiration Date:   10/28/2019   All questions were answered. The patient knows to call the clinic with any problems, questions or concerns.      Cammie Sickle, MD 10/28/2018 1:53 PM

## 2018-10-28 NOTE — Assessment & Plan Note (Addendum)
#   Colon cancer- cecal/ right-sided; STAGE IV; Feb 2020- MRI increasing lesion in liver ~1.8 cm [see below].  CEA -25 slowly rising; at 25.   # proceed with 5FU infusion only today; Labs today reviewed;  acceptable for treatment today.  Discussed with the patient that has a tumor marker slightly rising and concerned of continued progression of disease.  Will plan CT A/P ASAP; prior to IR ablation. If multiple lesions noted; would recommend change in systemic therapy TAS 102/ stivarga.   # diarrhea- G-1-2; continue lomotil/ imodium-stable  #L2 metastases status post ablation [Jan third]-on fentanyl/hydrocodone as needed stable.  # Hypokalemia- 3.7 stable.   # Hypocalcima stable.   # Peripheral neuropathy -stable.   # hoarseness of voive- try anti-histamine..   # DISPOSITION:  # Treatment today; CIV 5FU; avastin    # Schedule CT A/P asap.  #  Follow up in  2 weeks; MD/ labs- cbc/cmp/cea/UA chemo-5FU pump-avastin; pump off in 2 days- Dr.B

## 2018-10-28 NOTE — Progress Notes (Signed)
I just re-ordered the CT a/p asap- Thx

## 2018-10-29 ENCOUNTER — Ambulatory Visit: Payer: BLUE CROSS/BLUE SHIELD | Admitting: Internal Medicine

## 2018-10-29 ENCOUNTER — Ambulatory Visit: Payer: BLUE CROSS/BLUE SHIELD

## 2018-10-29 ENCOUNTER — Other Ambulatory Visit: Payer: BLUE CROSS/BLUE SHIELD

## 2018-10-29 LAB — CEA: CEA: 22.7 ng/mL — ABNORMAL HIGH (ref 0.0–4.7)

## 2018-10-30 ENCOUNTER — Inpatient Hospital Stay: Payer: BC Managed Care – PPO

## 2018-10-30 ENCOUNTER — Other Ambulatory Visit: Payer: Self-pay

## 2018-10-30 VITALS — BP 124/69 | HR 86

## 2018-10-30 DIAGNOSIS — C182 Malignant neoplasm of ascending colon: Secondary | ICD-10-CM

## 2018-10-30 MED ORDER — HEPARIN SOD (PORK) LOCK FLUSH 100 UNIT/ML IV SOLN
500.0000 [IU] | Freq: Once | INTRAVENOUS | Status: AC | PRN
  Administered 2018-10-30: 500 [IU]

## 2018-10-30 MED ORDER — SODIUM CHLORIDE 0.9% FLUSH
10.0000 mL | INTRAVENOUS | Status: DC | PRN
  Administered 2018-10-30: 10 mL
  Filled 2018-10-30: qty 10

## 2018-11-01 ENCOUNTER — Other Ambulatory Visit: Payer: Self-pay

## 2018-11-01 ENCOUNTER — Ambulatory Visit
Admission: RE | Admit: 2018-11-01 | Discharge: 2018-11-01 | Disposition: A | Discharge status: Home / Self Care | Payer: BC Managed Care – PPO | Source: Ambulatory Visit | Attending: Internal Medicine | Admitting: Internal Medicine

## 2018-11-01 ENCOUNTER — Other Ambulatory Visit: Payer: Self-pay | Admitting: Radiology

## 2018-11-01 DIAGNOSIS — C182 Malignant neoplasm of ascending colon: Secondary | ICD-10-CM | POA: Insufficient documentation

## 2018-11-01 DIAGNOSIS — C772 Secondary and unspecified malignant neoplasm of intra-abdominal lymph nodes: Secondary | ICD-10-CM | POA: Diagnosis present

## 2018-11-01 MED ORDER — IOHEXOL 300 MG/ML  SOLN
100.0000 mL | Freq: Once | INTRAMUSCULAR | Status: AC | PRN
  Administered 2018-11-01: 100 mL via INTRAVENOUS

## 2018-11-01 NOTE — Patient Instructions (Addendum)
Nancy Clark    Your procedure is scheduled on      Wednesday: 6-17-202  Report to [facility] Main  Entrance Report to RADIOLOGY at  [place] 19 TEST ON Saturday 11-02-2018 @1020  AM, THIS TEST MUST BE DONE BEFORE SURGERY, COME TO [place] ENTRANCE. ONCE YOUR COVID TEST IS COMPLETED, PLEASE BEGIN THE QUARANTINE INSTRUCTIONS AS OUTLINED IN YOUR HANDOUT.    Call this number if you have problems the morning of surgery [phone]     Remember: Do not eat food or drink liquids :After Midnight          . BRUSH YOUR TEETH MORNING OF SURGERY AND RINSE YOUR MOUTH OUT, NO CHEWING GUM CANDY OR MINTS.     Take these medicines the morning of surgery with A SIP OF WATER:               FENTANYL PATCH, GABAPENTIN (NEURONTIN)                                You may not have any metal on your body including hair pins and              piercings            Do not wear jewelry, make-up, lotions, powders or perfumes, deodorant             Do not wear nail polish.  Do not shave  48 hours prior to surgery.     Do not bring valuables to the hospital. [place].  Contacts, dentures or bridgework may not be worn into surgery.      ___________________________________________________________________________________________________             [facility] - Preparing for Surgery  Before surgery, you can play an important role .  Because skin is not sterile, your skin needs to be as free of germs as possible .  You can reduce the number of germs on your skin by washing with CHG (chlorahexidine gluconate) soap before surgery.   CHG is an antiseptic cleaner which kills germs and bonds with the skin to continue killing germs even after washing. Please DO NOT use if you have an allergy to CHG or antibacterial soaps.   If your skin becomes reddened/irritated stop using the  CHG and inform your nurse when you arrive at Short Stay. Do not shave (including legs and underarms) for at least 48 hours prior to the first CHG shower.    Please follow these instructions carefully:   1.  Shower with CHG Soap the night before surgery and the  morning of Surgery.  2.  If you choose to wash your hair, wash your hair first as usual with your  normal  shampoo.  3.  After you shampoo, rinse your hair and body thoroughly to remove the  shampoo.                                        4.  Use CHG as you would any other liquid soap.  You can  apply chg directly  to the skin and wash                       Gently with a scrungie or clean washcloth.  5.  Apply the CHG Soap to your body ONLY FROM THE NECK DOWN.   Do not use on face/ open                           Wound or open sores. Avoid contact with eyes, ears mouth and genitals (private parts).                       Wash face,  Genitals (private parts) with your normal soap.             6.  Wash thoroughly, paying special attention to the area where your surgery  will be performed.  7.  Thoroughly rinse your body with warm water from the neck down.  8.  DO NOT shower/wash with your normal soap after using and rinsing off  the CHG Soap.             9.  Pat yourself dry with a clean towel.            10.  Wear clean pajamas.            11.  Place clean sheets on your bed the night of your first shower and do not  sleep with pets.  Day of Surgery : Do not apply any lotions/deodorants the morning of surgery.  Please wear clean clothes to the hospital/surgery center.    FAILURE TO FOLLOW THESE INSTRUCTIONS MAY RESULT IN THE CANCELLATION OF YOUR SURGERY PATIENT SIGNATURE_________________________________  NURSE SIGNATURE__________________________________  ________________________________________________________________________

## 2018-11-02 ENCOUNTER — Other Ambulatory Visit (HOSPITAL_COMMUNITY)
Admission: RE | Admit: 2018-11-02 | Discharge: 2018-11-02 | Disposition: A | Discharge status: Home / Self Care | Payer: BC Managed Care – PPO | Source: Ambulatory Visit | Attending: Interventional Radiology | Admitting: Interventional Radiology

## 2018-11-02 DIAGNOSIS — Z1159 Encounter for screening for other viral diseases: Secondary | ICD-10-CM | POA: Insufficient documentation

## 2018-11-04 ENCOUNTER — Encounter (HOSPITAL_COMMUNITY): Payer: Self-pay

## 2018-11-04 ENCOUNTER — Ambulatory Visit (HOSPITAL_COMMUNITY)
Admission: RE | Admit: 2018-11-04 | Discharge: 2018-11-04 | Disposition: A | Discharge status: Home / Self Care | Payer: BC Managed Care – PPO | Source: Ambulatory Visit | Attending: Radiology | Admitting: Radiology

## 2018-11-04 ENCOUNTER — Other Ambulatory Visit: Payer: Self-pay

## 2018-11-04 ENCOUNTER — Encounter (HOSPITAL_COMMUNITY)
Admission: RE | Admit: 2018-11-04 | Discharge: 2018-11-04 | Disposition: A | Discharge status: Home / Self Care | Payer: BC Managed Care – PPO | Source: Ambulatory Visit | Attending: Interventional Radiology | Admitting: Interventional Radiology

## 2018-11-04 DIAGNOSIS — I10 Essential (primary) hypertension: Secondary | ICD-10-CM | POA: Diagnosis not present

## 2018-11-04 DIAGNOSIS — Z01818 Encounter for other preprocedural examination: Secondary | ICD-10-CM | POA: Diagnosis present

## 2018-11-04 DIAGNOSIS — C772 Secondary and unspecified malignant neoplasm of intra-abdominal lymph nodes: Secondary | ICD-10-CM | POA: Insufficient documentation

## 2018-11-04 DIAGNOSIS — Z87891 Personal history of nicotine dependence: Secondary | ICD-10-CM | POA: Diagnosis not present

## 2018-11-04 DIAGNOSIS — C182 Malignant neoplasm of ascending colon: Secondary | ICD-10-CM | POA: Diagnosis not present

## 2018-11-04 DIAGNOSIS — Z79899 Other long term (current) drug therapy: Secondary | ICD-10-CM | POA: Diagnosis not present

## 2018-11-04 LAB — COMPREHENSIVE METABOLIC PANEL
ALT: 10 U/L (ref 0–44)
AST: 14 U/L — ABNORMAL LOW (ref 15–41)
Albumin: 4 g/dL (ref 3.5–5.0)
Alkaline Phosphatase: 51 U/L (ref 38–126)
Anion gap: 12 (ref 5–15)
BUN: 12 mg/dL (ref 6–20)
CO2: 23 mmol/L (ref 22–32)
Calcium: 8.7 mg/dL — ABNORMAL LOW (ref 8.9–10.3)
Chloride: 102 mmol/L (ref 98–111)
Creatinine, Ser: 0.86 mg/dL (ref 0.44–1.00)
GFR calc Af Amer: >60 mL/min (ref >60)
GFR calc non Af Amer: >60 mL/min (ref >60)
Glucose, Bld: 84 mg/dL (ref 70–99)
Potassium: 3.8 mmol/L (ref 3.5–5.1)
Sodium: 137 mmol/L (ref 135–145)
Total Bilirubin: 0.4 mg/dL (ref 0.3–1.2)
Total Protein: 7.1 g/dL (ref 6.5–8.1)

## 2018-11-04 LAB — CBC WITH DIFFERENTIAL/PLATELET
Abs Immature Granulocytes: 0.03 10*3/uL (ref 0.00–0.07)
Basophils Absolute: 0 10*3/uL (ref 0.0–0.1)
Basophils Relative: 1 %
Eosinophils Absolute: 0.1 10*3/uL (ref 0.0–0.5)
Eosinophils Relative: 2 %
HCT: 40.5 % (ref 36.0–46.0)
Hemoglobin: 12.7 g/dL (ref 12.0–15.0)
Immature Granulocytes: 1 %
Lymphocytes Relative: 19 %
Lymphs Abs: 1.1 10*3/uL (ref 0.7–4.0)
MCH: 31 pg (ref 26.0–34.0)
MCHC: 31.4 g/dL (ref 30.0–36.0)
MCV: 98.8 fL (ref 80.0–100.0)
Monocytes Absolute: 0.7 10*3/uL (ref 0.1–1.0)
Monocytes Relative: 12 %
Neutro Abs: 4 10*3/uL (ref 1.7–7.7)
Neutrophils Relative %: 65 %
Platelets: 161 10*3/uL (ref 150–400)
RBC: 4.1 MIL/uL (ref 3.87–5.11)
RDW: 16 % — ABNORMAL HIGH (ref 11.5–15.5)
WBC: 6 10*3/uL (ref 4.0–10.5)
nRBC: 0 % (ref 0.0–0.2)

## 2018-11-04 LAB — NOVEL CORONAVIRUS, NAA (HOSP ORDER, SEND-OUT TO REF LAB; TAT 18-24 HRS): SARS-CoV-2, NAA: NOT DETECTED

## 2018-11-04 LAB — PROTIME-INR
INR: 1 (ref 0.8–1.2)
Prothrombin Time: 12.7 seconds (ref 11.4–15.2)

## 2018-11-04 LAB — ABO/RH: ABO/RH(D): O POS

## 2018-11-05 ENCOUNTER — Other Ambulatory Visit: Payer: Self-pay | Admitting: Radiology

## 2018-11-05 NOTE — Anesthesia Preprocedure Evaluation (Addendum)
Anesthesia Evaluation  Patient identified by MRN, date of birth, ID band Patient awake    Reviewed: Allergy & Precautions, NPO status , Patient's Chart, lab work & pertinent test results  Airway Mallampati: II  TM Distance: >3 FB     Dental   Pulmonary former smoker,    breath sounds clear to auscultation       Cardiovascular hypertension,  Rhythm:Regular Rate:Normal     Neuro/Psych    GI/Hepatic negative GI ROS, Neg liver ROS,   Endo/Other    Renal/GU negative Renal ROS     Musculoskeletal   Abdominal   Peds  Hematology   Anesthesia Other Findings   Reproductive/Obstetrics                            Anesthesia Physical Anesthesia Plan  ASA: III  Anesthesia Plan: General   Post-op Pain Management:    Induction: Intravenous  PONV Risk Score and Plan: Ondansetron and Midazolam  Airway Management Planned: Oral ETT  Additional Equipment:   Intra-op Plan:   Post-operative Plan: Possible Post-op intubation/ventilation  Informed Consent: I have reviewed the patients History and Physical, chart, labs and discussed the procedure including the risks, benefits and alternatives for the proposed anesthesia with the patient or authorized representative who has indicated his/her understanding and acceptance.     Dental advisory given  Plan Discussed with: Anesthesiologist and CRNA  Anesthesia Plan Comments:       Anesthesia Quick Evaluation

## 2018-11-06 ENCOUNTER — Ambulatory Visit (HOSPITAL_COMMUNITY): Payer: BC Managed Care – PPO | Admitting: Physician Assistant

## 2018-11-06 ENCOUNTER — Encounter (HOSPITAL_COMMUNITY): Payer: Self-pay

## 2018-11-06 ENCOUNTER — Encounter (HOSPITAL_COMMUNITY): Payer: Self-pay | Admitting: General Practice

## 2018-11-06 ENCOUNTER — Observation Stay (HOSPITAL_COMMUNITY)
Admission: RE | Admit: 2018-11-06 | Discharge: 2018-11-07 | Disposition: A | Discharge status: Home / Self Care | Payer: BC Managed Care – PPO | Attending: Interventional Radiology | Admitting: Interventional Radiology

## 2018-11-06 ENCOUNTER — Other Ambulatory Visit: Payer: Self-pay

## 2018-11-06 ENCOUNTER — Telehealth (HOSPITAL_COMMUNITY): Payer: Self-pay | Admitting: *Deleted

## 2018-11-06 ENCOUNTER — Ambulatory Visit (HOSPITAL_COMMUNITY)
Admission: RE | Admit: 2018-11-06 | Discharge: 2018-11-06 | Disposition: A | Discharge status: Home / Self Care | Payer: BC Managed Care – PPO | Source: Ambulatory Visit | Attending: Interventional Radiology | Admitting: Interventional Radiology

## 2018-11-06 ENCOUNTER — Encounter (HOSPITAL_COMMUNITY)
Admission: RE | Disposition: A | Discharge status: Home / Self Care | Payer: Self-pay | Source: Home / Self Care | Attending: Interventional Radiology

## 2018-11-06 ENCOUNTER — Ambulatory Visit (HOSPITAL_COMMUNITY): Payer: BC Managed Care – PPO | Admitting: Certified Registered"

## 2018-11-06 DIAGNOSIS — Z9049 Acquired absence of other specified parts of digestive tract: Secondary | ICD-10-CM | POA: Insufficient documentation

## 2018-11-06 DIAGNOSIS — C189 Malignant neoplasm of colon, unspecified: Secondary | ICD-10-CM

## 2018-11-06 DIAGNOSIS — Z8249 Family history of ischemic heart disease and other diseases of the circulatory system: Secondary | ICD-10-CM | POA: Diagnosis not present

## 2018-11-06 DIAGNOSIS — G620 Drug-induced polyneuropathy: Secondary | ICD-10-CM | POA: Insufficient documentation

## 2018-11-06 DIAGNOSIS — Z87891 Personal history of nicotine dependence: Secondary | ICD-10-CM | POA: Diagnosis not present

## 2018-11-06 DIAGNOSIS — T451X5A Adverse effect of antineoplastic and immunosuppressive drugs, initial encounter: Secondary | ICD-10-CM | POA: Diagnosis not present

## 2018-11-06 DIAGNOSIS — R160 Hepatomegaly, not elsewhere classified: Secondary | ICD-10-CM | POA: Diagnosis present

## 2018-11-06 DIAGNOSIS — I10 Essential (primary) hypertension: Secondary | ICD-10-CM | POA: Insufficient documentation

## 2018-11-06 DIAGNOSIS — C787 Secondary malignant neoplasm of liver and intrahepatic bile duct: Secondary | ICD-10-CM | POA: Diagnosis not present

## 2018-11-06 DIAGNOSIS — Z79899 Other long term (current) drug therapy: Secondary | ICD-10-CM | POA: Insufficient documentation

## 2018-11-06 HISTORY — PX: RADIOLOGY WITH ANESTHESIA: SHX6223

## 2018-11-06 LAB — TYPE AND SCREEN
ABO/RH(D): O POS
Antibody Screen: NEGATIVE

## 2018-11-06 SURGERY — CT WITH ANESTHESIA
Anesthesia: General

## 2018-11-06 MED ORDER — SODIUM CHLORIDE (PF) 0.9 % IJ SOLN
INTRAMUSCULAR | Status: AC
  Filled 2018-11-06: qty 50

## 2018-11-06 MED ORDER — SUCCINYLCHOLINE CHLORIDE 200 MG/10ML IV SOSY
PREFILLED_SYRINGE | INTRAVENOUS | Status: DC | PRN
  Administered 2018-11-06: 120 mg via INTRAVENOUS

## 2018-11-06 MED ORDER — ENSURE ENLIVE PO LIQD: 237.0000 mL | ORAL | Status: DC

## 2018-11-06 MED ORDER — FENTANYL CITRATE (PF) 100 MCG/2ML IJ SOLN
INTRAMUSCULAR | Status: AC
  Filled 2018-11-06: qty 2

## 2018-11-06 MED ORDER — HYDROCODONE-ACETAMINOPHEN 5-325 MG PO TABS
1.0000 | ORAL_TABLET | ORAL | Status: DC | PRN
  Administered 2018-11-06: 1 via ORAL
  Filled 2018-11-06: qty 1

## 2018-11-06 MED ORDER — GABAPENTIN 300 MG PO CAPS
600.0000 mg | ORAL_CAPSULE | Freq: Every day | ORAL | Status: DC
  Administered 2018-11-06: 600 mg via ORAL
  Filled 2018-11-06: qty 2

## 2018-11-06 MED ORDER — SODIUM CHLORIDE (PF) 0.9 % IJ SOLN
INTRAMUSCULAR | Status: AC
  Filled 2018-11-06: qty 100

## 2018-11-06 MED ORDER — PIPERACILLIN-TAZOBACTAM 3.375 G IVPB
3.3750 g | Freq: Once | INTRAVENOUS | Status: AC
  Administered 2018-11-06: 3.375 g via INTRAVENOUS
  Filled 2018-11-06: qty 50

## 2018-11-06 MED ORDER — DOCUSATE SODIUM 100 MG PO CAPS
100.0000 mg | ORAL_CAPSULE | Freq: Two times a day (BID) | ORAL | Status: DC
  Administered 2018-11-06 – 2018-11-07 (×2): 100 mg via ORAL
  Filled 2018-11-06 (×2): qty 1

## 2018-11-06 MED ORDER — LIDOCAINE 2% (20 MG/ML) 5 ML SYRINGE
INTRAMUSCULAR | Status: DC | PRN
  Administered 2018-11-06: 60 mg via INTRAVENOUS

## 2018-11-06 MED ORDER — IOHEXOL 350 MG/ML SOLN
100.0000 mL | Freq: Once | INTRAVENOUS | Status: AC | PRN
  Administered 2018-11-06: 75 mL via INTRAVENOUS

## 2018-11-06 MED ORDER — ESMOLOL HCL 100 MG/10ML IV SOLN
INTRAVENOUS | Status: DC | PRN
  Administered 2018-11-06: 30 mg via INTRAVENOUS

## 2018-11-06 MED ORDER — MIDAZOLAM HCL 5 MG/5ML IJ SOLN
INTRAMUSCULAR | Status: DC | PRN
  Administered 2018-11-06: 2 mg via INTRAVENOUS

## 2018-11-06 MED ORDER — SODIUM CHLORIDE 0.9 % IV SOLN
INTRAVENOUS | Status: DC | PRN
  Administered 2018-11-06: 40 ug/min via INTRAVENOUS

## 2018-11-06 MED ORDER — GABAPENTIN 300 MG PO CAPS: 300.0000 mg | ORAL_CAPSULE | ORAL | Status: DC

## 2018-11-06 MED ORDER — DIPHENOXYLATE-ATROPINE 2.5-0.025 MG PO TABS: 1.0000 | ORAL_TABLET | Freq: Four times a day (QID) | ORAL | Status: DC | PRN

## 2018-11-06 MED ORDER — PROCHLORPERAZINE MALEATE 10 MG PO TABS: 10.0000 mg | ORAL_TABLET | Freq: Four times a day (QID) | ORAL | Status: DC | PRN

## 2018-11-06 MED ORDER — POTASSIUM CHLORIDE CRYS ER 20 MEQ PO TBCR
20.0000 meq | EXTENDED_RELEASE_TABLET | Freq: Two times a day (BID) | ORAL | Status: DC
  Administered 2018-11-06 – 2018-11-07 (×2): 20 meq via ORAL
  Filled 2018-11-06 (×2): qty 1

## 2018-11-06 MED ORDER — ONDANSETRON HCL 4 MG/2ML IJ SOLN: 4.0000 mg | Freq: Four times a day (QID) | INTRAMUSCULAR | Status: DC | PRN

## 2018-11-06 MED ORDER — FENTANYL CITRATE (PF) 100 MCG/2ML IJ SOLN
INTRAMUSCULAR | Status: DC | PRN
  Administered 2018-11-06 (×3): 25 ug via INTRAVENOUS
  Administered 2018-11-06: 50 ug via INTRAVENOUS
  Administered 2018-11-06: 25 ug via INTRAVENOUS
  Administered 2018-11-06 (×2): 50 ug via INTRAVENOUS

## 2018-11-06 MED ORDER — IOHEXOL 300 MG/ML  SOLN: 100.0000 mL | Freq: Once | INTRAMUSCULAR | Status: DC | PRN

## 2018-11-06 MED ORDER — IOHEXOL 350 MG/ML SOLN
100.0000 mL | Freq: Once | INTRAVENOUS | Status: AC | PRN
  Administered 2018-11-06: 11:00:00 50 mL via INTRAVENOUS

## 2018-11-06 MED ORDER — FENTANYL CITRATE (PF) 100 MCG/2ML IJ SOLN: 25.0000 ug | INTRAMUSCULAR | Status: DC | PRN

## 2018-11-06 MED ORDER — PROPOFOL 10 MG/ML IV BOLUS
INTRAVENOUS | Status: DC | PRN
  Administered 2018-11-06: 130 mg via INTRAVENOUS

## 2018-11-06 MED ORDER — DEXAMETHASONE SODIUM PHOSPHATE 10 MG/ML IJ SOLN
INTRAMUSCULAR | Status: DC | PRN
  Administered 2018-11-06: 8 mg via INTRAVENOUS

## 2018-11-06 MED ORDER — BENAZEPRIL HCL 20 MG PO TABS
20.0000 mg | ORAL_TABLET | Freq: Every day | ORAL | Status: DC
  Administered 2018-11-06: 20 mg via ORAL
  Filled 2018-11-06: qty 2
  Filled 2018-11-06: qty 1

## 2018-11-06 MED ORDER — ROCURONIUM BROMIDE 10 MG/ML (PF) SYRINGE
PREFILLED_SYRINGE | INTRAVENOUS | Status: DC | PRN
  Administered 2018-11-06: 20 mg via INTRAVENOUS
  Administered 2018-11-06: 30 mg via INTRAVENOUS
  Administered 2018-11-06: 10 mg via INTRAVENOUS

## 2018-11-06 MED ORDER — DULOXETINE HCL 20 MG PO CPEP
20.0000 mg | ORAL_CAPSULE | Freq: Two times a day (BID) | ORAL | Status: DC
  Administered 2018-11-06 – 2018-11-07 (×2): 20 mg via ORAL
  Filled 2018-11-06 (×2): qty 1

## 2018-11-06 MED ORDER — ALBUTEROL SULFATE HFA 108 (90 BASE) MCG/ACT IN AERS
INHALATION_SPRAY | RESPIRATORY_TRACT | Status: AC
  Filled 2018-11-06: qty 6.7

## 2018-11-06 MED ORDER — SENNOSIDES-DOCUSATE SODIUM 8.6-50 MG PO TABS: 1.0000 | ORAL_TABLET | Freq: Every day | ORAL | Status: DC | PRN

## 2018-11-06 MED ORDER — LOPERAMIDE HCL 2 MG PO CAPS: 2.0000 mg | ORAL_CAPSULE | Freq: Four times a day (QID) | ORAL | Status: DC | PRN

## 2018-11-06 MED ORDER — FENTANYL 25 MCG/HR TD PT72: 1.0000 | MEDICATED_PATCH | TRANSDERMAL | Status: DC

## 2018-11-06 MED ORDER — FENTANYL CITRATE (PF) 250 MCG/5ML IJ SOLN
INTRAMUSCULAR | Status: AC
  Filled 2018-11-06: qty 5

## 2018-11-06 MED ORDER — SUGAMMADEX SODIUM 200 MG/2ML IV SOLN
INTRAVENOUS | Status: DC | PRN
  Administered 2018-11-06: 200 mg via INTRAVENOUS

## 2018-11-06 MED ORDER — EPHEDRINE SULFATE-NACL 50-0.9 MG/10ML-% IV SOSY
PREFILLED_SYRINGE | INTRAVENOUS | Status: DC | PRN
  Administered 2018-11-06 (×2): 5 mg via INTRAVENOUS

## 2018-11-06 MED ORDER — ONDANSETRON HCL 4 MG/2ML IJ SOLN
INTRAMUSCULAR | Status: DC | PRN
  Administered 2018-11-06: 4 mg via INTRAVENOUS

## 2018-11-06 MED ORDER — HYDROCHLOROTHIAZIDE 12.5 MG PO CAPS
12.5000 mg | ORAL_CAPSULE | Freq: Every day | ORAL | Status: DC
  Administered 2018-11-06: 12.5 mg via ORAL
  Filled 2018-11-06: qty 1

## 2018-11-06 MED ORDER — LACTATED RINGERS IV SOLN
INTRAVENOUS | Status: DC
  Administered 2018-11-06 – 2018-11-07 (×3): via INTRAVENOUS

## 2018-11-06 MED ORDER — GABAPENTIN 300 MG PO CAPS
300.0000 mg | ORAL_CAPSULE | Freq: Every day | ORAL | Status: DC
  Filled 2018-11-06: qty 1

## 2018-11-06 MED ORDER — MIDAZOLAM HCL 2 MG/2ML IJ SOLN
INTRAMUSCULAR | Status: AC
  Filled 2018-11-06: qty 4

## 2018-11-06 MED ORDER — PHENYLEPHRINE 40 MCG/ML (10ML) SYRINGE FOR IV PUSH (FOR BLOOD PRESSURE SUPPORT)
PREFILLED_SYRINGE | INTRAVENOUS | Status: DC | PRN
  Administered 2018-11-06 (×3): 80 ug via INTRAVENOUS
  Administered 2018-11-06 (×3): 120 ug via INTRAVENOUS

## 2018-11-06 NOTE — H&P (Signed)
Chief Complaint: Colon cancer with mets to liver  Referring Physician(s): Dr. Rogue Bussing  Supervising Physician: Corrie Mckusick  Patient Status: [facility] - Out-pt  History of Present Illness: Nancy Clark is a 58 y.o. female was seen by Dr. Earleen Newport in March of this year to discuss image guided ablation of metastatic liver lesion. Her procedure had to be postponed secondary to the [place] pandemic, but she has been finally scheduled for procedure today. She has otherwise been doing well.  PMHx, meds, labs, imaging, allergies reviewed. Feels well, no recent fevers, chills, illness. Has been NPO today as directed.   Past Medical History:  Diagnosis Date   Chicken pox    Colon cancer ([place])    Partial colectomy 07/2015 + chemo tx's   Hypertension    Hypokalemia    Menopause    > 5 yrs   Peripheral neuropathy due to chemotherapy ([place])    bi lat feet    Past Surgical History:  Procedure Laterality Date   BREAST BIOPSY Right    2007? pt unsure done by [facility]   COLONOSCOPY     COLONOSCOPY WITH PROPOFOL N/A 10/14/2015   Procedure: COLONOSCOPY WITH PROPOFOL;  Surgeon: Hulen Luster, MD;  Location: [facility] ENDOSCOPY;  Service: Endoscopy;  Laterality: N/A;   COLOSTOMY REVISION Right 08/09/2015   Procedure: COLON RESECTION RIGHT;  Surgeon: Florene Glen, MD;  Location: [facility] ORS;  Service: General;  Laterality: Right;   ECTOPIC PREGNANCY SURGERY     IR BONE TUMOR(S)RF ABLATION  05/24/2018   IR KYPHO LUMBAR INC FX REDUCE BONE BX UNI/BIL CANNULATION INC/IMAGING  05/24/2018   IR RADIOLOGIST EVAL & MGMT  05/08/2018   IR RADIOLOGIST EVAL & MGMT  06/18/2018   IR RADIOLOGIST EVAL & MGMT  07/31/2018   PORTACATH PLACEMENT N/A 09/01/2015   Procedure: INSERTION PORT-A-CATH;  Surgeon: Florene Glen, MD;  Location: [facility] ORS;  Service: General;  Laterality: N/A;   SHOULDER SURGERY Left     Allergies: Patient has no known allergies.  Medications: Prior to Admission medications     Medication Sig Start Date End Date Taking? Authorizing Provider  acetaminophen (TYLENOL) 325 MG tablet Take 2 tablets (650 mg total) by mouth every 6 (six) hours as needed for mild pain (or Fever >/= 101). 10/14/15   Gouru, Illene Silver, MD  benazepril (LOTENSIN) 20 MG tablet TAKE 1 TABLET DAILY Patient taking differently: Take 20 mg by mouth at bedtime.  04/05/18   Cammie Sickle, MD  Calcium Carbonate-Vitamin D (CALCIUM 600/VITAMIN D PO) Take 1 tablet by mouth every evening.     [provider]  diphenoxylate-atropine (LOMOTIL) 2.5-0.025 MG tablet Take 1 tablet by mouth 4 (four) times daily as needed for diarrhea or loose stools. Take it along with immodium 09/09/18   Cammie Sickle, MD  DULoxetine (CYMBALTA) 20 MG capsule Take 2 capsules (40 mg total) by mouth daily. Patient taking differently: Take 20 mg by mouth 2 (two) times daily.  06/10/18   Cammie Sickle, MD  feeding supplement (BOOST HIGH PROTEIN) LIQD Take 1 Container by mouth daily.     [provider]  fentaNYL (DURAGESIC) 25 MCG/HR Place 1 patch onto the skin every 3 (three) days. 10/15/18   Earlie Server, MD  ferrous sulfate (FERROUSUL) 325 (65 FE) MG tablet Take 1 tablet (325 mg total) by mouth daily with breakfast. 10/14/15   Gouru, Illene Silver, MD  gabapentin (NEURONTIN) 300 MG capsule One pill in am; and 2 pills at night prior  to sleep. Patient taking differently: Take 300-600 mg by mouth See admin instructions. Take 1 capsule (300 mg) by mouth daily in the morning & take 2 capsules (600 mg) by mouth at night. 09/09/18   Cammie Sickle, MD  hydrochlorothiazide (MICROZIDE) 12.5 MG capsule Take 1 capsule (12.5 mg total) by mouth daily. Patient taking differently: Take 12.5 mg by mouth at bedtime.  07/22/18   Cammie Sickle, MD  HYDROcodone-acetaminophen (NORCO/VICODIN) 5-325 MG tablet Take 1 tablet by mouth every 12 (twelve) hours as needed for moderate pain. Patient taking differently: Take 1 tablet by  mouth 2 (two) times daily as needed (pain.).  09/09/18   Cammie Sickle, MD  lidocaine-prilocaine (EMLA) cream Apply 1 application topically as needed. Apply generously over the Mediport 45 minutes prior to chemotherapy. Patient taking differently: Apply 1 application topically as needed (prior to chemotherapy.).  05/27/18   Cammie Sickle, MD  loperamide (IMODIUM) 2 MG capsule Take 2 mg by mouth 4 (four) times daily as needed for diarrhea or loose stools.     [provider]  Multiple Vitamin (MULTIVITAMIN WITH MINERALS) TABS tablet Take 1 tablet by mouth daily.    [provider]  ondansetron (ZOFRAN) 8 MG tablet One pill every 8 hours as needed for nausea/vomitting. Patient taking differently: Take 8 mg by mouth every 8 (eight) hours as needed for nausea or vomiting.  09/09/18   Cammie Sickle, MD  potassium chloride SA (K-DUR,KLOR-CON) 20 MEQ tablet 1 pill twice a day Patient taking differently: Take 20 mEq by mouth 2 (two) times daily.  12/17/17   Cammie Sickle, MD  prochlorperazine (COMPAZINE) 10 MG tablet Take 1 tablet (10 mg total) by mouth every 6 (six) hours as needed for nausea or vomiting. 05/27/18   Cammie Sickle, MD     Family History  Problem Relation Age of Onset   Hypertension Mother    Arthritis Father    Prostate cancer Father    Diabetes Maternal Grandmother    Colon cancer Maternal Grandfather        colon    Social History   Socioeconomic History   Marital status: Married    Spouse name: Not on file   Number of children: Not on file   Years of education: Not on file   Highest education level: Not on file  Occupational History   Not on file  Social Needs   Financial resource strain: Not on file   Food insecurity    Worry: Not on file    Inability: Not on file   Transportation needs    Medical: Not on file    Non-medical: Not on file  Tobacco Use   Smoking status: Former Smoker    Packs/day:  0.25    Years: 2.00    Pack years: 0.50   Smokeless tobacco: Never Used   Tobacco comment: recently quit  Substance and Sexual Activity   Alcohol use: No    Alcohol/week: 0.0 standard drinks   Drug use: No   Sexual activity: Yes  Lifestyle   Physical activity    Days per week: Not on file    Minutes per session: Not on file   Stress: Not on file  Relationships   Social connections    Talks on phone: Not on file    Gets together: Not on file    Attends religious service: Not on file    Active member of club or organization: Not on file  Attends meetings of clubs or organizations: Not on file    Relationship status: Not on file  Other Topics Concern   Not on file  Social History Narrative   Not on file     Review of Systems: A 12 point ROS discussed and pertinent positives are indicated in the HPI above.  All other systems are negative.  Review of Systems  Vital Signs: LMP  (LMP Unknown) Comment: LMP MORE THAN 5 YRS  Physical Exam Constitutional:      General: She is not in acute distress.    Appearance: Normal appearance. She is not ill-appearing.  HENT:     Mouth/Throat:     Mouth: Mucous membranes are moist.     Pharynx: Oropharynx is clear.  Cardiovascular:     Rate and Rhythm: Normal rate and regular rhythm.     Heart sounds: Normal heart sounds.  Pulmonary:     Effort: Pulmonary effort is normal. No respiratory distress.     Breath sounds: Normal breath sounds.  Abdominal:     General: Abdomen is flat. There is no distension.     Palpations: Abdomen is soft. There is no mass.     Tenderness: There is no abdominal tenderness.  Skin:    General: Skin is warm and dry.     Coloration: Skin is not jaundiced.  Neurological:     General: No focal deficit present.     Mental Status: She is alert and oriented to person, place, and time.  Psychiatric:        Mood and Affect: Mood normal.        Judgment: Judgment normal.      Imaging: Dg  Chest 2 View  Result Date: 11/04/2018 CLINICAL DATA:  Pt for liver ablation x 2 days from now, denies chest complaints. EXAM: CHEST - 2 VIEW COMPARISON:  Chest x-ray dated 09/01/2015. FINDINGS: Heart size and mediastinal contours are within normal limits. LEFT chest wall Port-A-Cath is adequately positioned with tip at the level of the upper SVC. Lungs are clear. No pleural effusions seen. No acute appearing osseous abnormality. IMPRESSION: No active cardiopulmonary disease. Electronically Signed   By: Franki Cabot M.D.   On: 11/04/2018 15:10   Ct Abdomen Pelvis W Contrast  Result Date: 11/01/2018 CLINICAL DATA:  58 year old female with history of metastatic adenocarcinoma of the colon scheduled for upcoming liver ablation. EXAM: CT ABDOMEN AND PELVIS WITH CONTRAST TECHNIQUE: Multidetector CT imaging of the abdomen and pelvis was performed using the standard protocol following bolus administration of intravenous contrast. CONTRAST:  155mL OMNIPAQUE IOHEXOL 300 MG/ML  SOLN COMPARISON:  MRI of the abdomen 07/18/2018. CT the abdomen and pelvis 02/22/2018. FINDINGS: Lower chest: Unremarkable. Hepatobiliary: The previously noted metastatic lesion in the right lobe of the liver is located between segments 7 and 8 (axial image 15 of series 2 and coronal image 39 of series 5) in currently measures 3.1 x 2.5 x 2.6 cm. Several other tiny subcentimeter low-attenuation lesions are scattered throughout the liver, too small to characterize, but similar in size and number to prior examinations, statistically likely to represent tiny cysts. No new suspicious appearing hepatic lesions. No intra or extrahepatic biliary ductal dilatation. Gallbladder is normal in appearance. Pancreas: No pancreatic mass. No pancreatic ductal dilatation. No pancreatic or peripancreatic fluid or inflammatory changes. Spleen: Unremarkable. Adrenals/Urinary Tract: Bilateral kidneys and bilateral adrenal glands are normal in appearance. No  hydroureteronephrosis. Urinary bladder is normal in appearance. Stomach/Bowel: Normal appearance of the stomach. No pathologic  dilatation of small bowel or colon. The appendix is not confidently identified and may be surgically absent. Regardless, there are no inflammatory changes noted adjacent to the cecum to suggest the presence of an acute appendicitis at this time. Vascular/Lymphatic: Aortic atherosclerosis, without evidence of aneurysm or dissection in the abdominal or pelvic vasculature. No lymphadenopathy noted in the abdomen or pelvis. Reproductive: Uterus and ovaries are unremarkable in appearance. Other: No significant volume of ascites.  No pneumoperitoneum. Musculoskeletal: Sclerotic lesion in L2 with post vertebroplasty changes, similar to prior examinations. No new suspicious appearing lytic or blastic lesions. IMPRESSION: 1. Interval enlargement of metastatic lesion in segment 7/8 of the liver, which currently measures 3.1 x 2.5 x 2.6 cm. 2. Similar sclerotic lesion in L2 vertebral body with post vertebroplasty changes. No new osseous lesions are noted. 3. No other signs of metastatic disease elsewhere in the abdomen or pelvis. Electronically Signed   By: Vinnie Langton M.D.   On: 11/01/2018 10:53    Labs:  CBC: Recent Labs    09/30/18 1112 10/15/18 0928 10/28/18 0909 11/04/18 0946  WBC 8.8 6.9 7.2 6.0  HGB 10.7* 10.8* 11.0* 12.7  HCT 33.4* 33.9* 34.6* 40.5  PLT 115* 138* 110* 161    COAGS: Recent Labs    05/24/18 0955 11/04/18 0946  INR 1.11 1.0    BMP: Recent Labs    09/30/18 1112 10/15/18 0928 10/28/18 0909 11/04/18 0946  NA 135 137 138 137  K 3.7 3.4* 3.9 3.8  CL 103 106 109 102  CO2 24 24 21* 23  GLUCOSE 121* 93 94 84  BUN 11 12 9 12   CALCIUM 8.4* 8.3* 8.6* 8.7*  CREATININE 0.84 1.01* 0.95 0.86  GFRNONAA >60 >60 >60 >60  GFRAA >60 >60 >60 >60    LIVER FUNCTION TESTS: Recent Labs    09/30/18 1112 10/15/18 0928 10/28/18 0909 11/04/18 0946    BILITOT 0.4 0.4 0.4 0.4  AST 15 15 12* 14*  ALT 8 8 9 10   ALKPHOS 49 50 42 51  PROT 6.2* 6.3* 6.2* 7.1  ALBUMIN 3.3* 3.5 3.4* 4.0    TUMOR MARKERS: No results for input(s): AFPTM, CEA, CA199, CHROMGRNA in the last 8760 hours.  Assessment and Plan: Colon cancer with mets to the liver Plan for CT guided thermal/RF ablation of solitary right liver lesion. Labs reviewed, COVID Negative Risks and benefits of liver lesion ablation was discussed with the patient and/or patient's family including, but not limited to bleeding, infection, damage to adjacent structures or low yield requiring additional tests.  All of the questions were answered and there is agreement to proceed.  Consent signed and in chart.  Pt to be admitted overnight for observation    Thank you for this interesting consult.  I greatly enjoyed meeting CARMELIA TINER and look forward to participating in their care.  A copy of this report was sent to the requesting provider on this date.  Electronically Signed: Ascencion Dike, PA-C 11/06/2018, 8:15 AM

## 2018-11-06 NOTE — Transfer of Care (Signed)
Immediate Anesthesia Transfer of Care Note  Patient: Nancy Clark  Procedure(s) Performed: CT-MICROWAVE THERMAL ABLATION/LIVER (N/A )  Patient Location: PACU  Anesthesia Type:General  Level of Consciousness: drowsy, patient cooperative and responds to stimulation  Airway & Oxygen Therapy: Patient Spontanous Breathing and Patient connected to face mask oxygen  Post-op Assessment: Report given to RN and Post -op Vital signs reviewed and stable  Post vital signs: Reviewed and stable  Last Vitals:  Vitals Value Taken Time  BP 125/73 11/06/18 1149  Temp    Pulse 68 11/06/18 1150  Resp 11 11/06/18 1150  SpO2 100 % 11/06/18 1150  Vitals shown include unvalidated device data.  Last Pain:  Vitals:   11/06/18 0654  TempSrc: Oral  PainSc: 0-No pain         Complications: No apparent anesthesia complications

## 2018-11-06 NOTE — Procedures (Signed)
Interventional Radiology Procedure Note  Procedure: ablation of right hepatic lesion.  2x microwave probe @65W  for 28min, then single probe, 65W for 5 minutes.     Complications: None EBL: none Recommendations:  - to pacu - admit overnight for observation - Do not submerge for 7 days - Routine care - VIR to follow - advance diet as tolerated   Signed,  Dulcy Fanny. Earleen Newport, DO

## 2018-11-06 NOTE — Anesthesia Procedure Notes (Signed)
Procedure Name: Intubation Date/Time: 11/06/2018 8:49 AM Performed by: Silas Sacramento, CRNA Pre-anesthesia Checklist: Patient identified, Emergency Drugs available, Suction available and Patient being monitored Patient Re-evaluated:Patient Re-evaluated prior to induction Oxygen Delivery Method: Circle system utilized Preoxygenation: Pre-oxygenation with 100% oxygen Induction Type: IV induction and Rapid sequence Laryngoscope Size: Mac and 3 Grade View: Grade I Tube type: Oral Tube size: 7.0 mm Number of attempts: 1 Airway Equipment and Method: Stylet and Oral airway Placement Confirmation: ETT inserted through vocal cords under direct vision,  positive ETCO2 and breath sounds checked- equal and bilateral Secured at: 23 cm Tube secured with: Tape Dental Injury: Teeth and Oropharynx as per pre-operative assessment

## 2018-11-06 NOTE — Sedation Documentation (Signed)
Anesthesia in to sedate and monitor. 

## 2018-11-06 NOTE — Anesthesia Postprocedure Evaluation (Signed)
Anesthesia Post Note  Patient: Nancy Clark  Procedure(s) Performed: CT-MICROWAVE THERMAL ABLATION/LIVER (N/A )     Patient location during evaluation: PACU Anesthesia Type: General Level of consciousness: awake Pain management: pain level controlled Vital Signs Assessment: post-procedure vital signs reviewed and stable Postop Assessment: no apparent nausea or vomiting    Last Vitals:  Vitals:   11/06/18 0654  BP: 106/75  Pulse: 76  Resp: 16  Temp: 36.7 C  SpO2: 100%    Last Pain:  Vitals:   11/06/18 0654  TempSrc: Oral  PainSc: 0-No pain                 Jessicia Napolitano

## 2018-11-07 ENCOUNTER — Encounter (HOSPITAL_COMMUNITY): Payer: Self-pay | Admitting: Interventional Radiology

## 2018-11-07 DIAGNOSIS — C189 Malignant neoplasm of colon, unspecified: Secondary | ICD-10-CM | POA: Diagnosis not present

## 2018-11-07 NOTE — Discharge Summary (Signed)
Patient ID: Nancy Clark MRN: [id number] DOB/AGE: July 08, 1960 58 y.o.  Admit date: 11/06/2018 Discharge date: 11/07/2018  Supervising Physician: Corrie Mckusick  Patient Status: [facility] - In-pt  Admission Diagnoses: Metastatic colon cancer to liver  Discharge Diagnoses: Metastatic colon cancer to liver, status post CT-guided microwave ablation of 3 cm segment 7/8  liver lesion on 11/06/2018 Active Problems:   Liver mass, right lobe  Past Medical History:  Diagnosis Date  . Chicken pox   . Colon cancer ([place])    Partial colectomy 07/2015 + chemo tx's  . Hypertension   . Hypokalemia   . Menopause    > 5 yrs  . Peripheral neuropathy due to chemotherapy ([place])    bi lat feet   Past Surgical History:  Procedure Laterality Date  . BREAST BIOPSY Right    2007? pt unsure done by [facility]  . COLONOSCOPY    . COLONOSCOPY WITH PROPOFOL N/A 10/14/2015   Procedure: COLONOSCOPY WITH PROPOFOL;  Surgeon: Hulen Luster, MD;  Location: [facility] ENDOSCOPY;  Service: Endoscopy;  Laterality: N/A;  . COLOSTOMY REVISION Right 08/09/2015   Procedure: COLON RESECTION RIGHT;  Surgeon: Florene Glen, MD;  Location: [facility] ORS;  Service: General;  Laterality: Right;  . ECTOPIC PREGNANCY SURGERY    . IR BONE TUMOR(S)RF ABLATION  05/24/2018  . IR KYPHO LUMBAR INC FX REDUCE BONE BX UNI/BIL CANNULATION INC/IMAGING  05/24/2018  . IR RADIOLOGIST EVAL & MGMT  05/08/2018  . IR RADIOLOGIST EVAL & MGMT  06/18/2018  . IR RADIOLOGIST EVAL & MGMT  07/31/2018  . PORTACATH PLACEMENT N/A 09/01/2015   Procedure: INSERTION PORT-A-CATH;  Surgeon: Florene Glen, MD;  Location: [facility] ORS;  Service: General;  Laterality: N/A;  . SHOULDER SURGERY Left      Discharged Condition: good  Hospital Course: Nancy Clark is a 58 year old female with history of metastatic colon cancer who was seen in consultation by Dr. Earleen Newport on 07/31/2018 to discuss treatment options for an enlarging 3 cm segment 7/8 liver lesion consistent with metastasis.  She  also has a history of prior L2 radiofrequency ablation/osteo-cool/kyphoplasty on 05/24/2018 secondary to pathologic fracture.  Following recent consultation, she was deemed an appropriate candidate for CT-guided microwave ablation of the liver lesion and underwent the procedure at [facility] on 11/06/2018 via general anesthesia.  Following the procedure she was admitted to the hospital for overnight observation.  Overnight she did well without fever, respiratory problems ,significant abdominal pain, nausea, vomiting or bleeding.  On the morning of discharge she was stable. She was able to void, ambulate and tolerate her diet without significant difficulty.  She was deemed stable for discharge at this time.  She will follow-up with Dr. Earleen Newport in the [place] clinic in 3 to 4 weeks.  She will continue current oncologic care with Dr. Rogue Bussing.  She was told to contact our service with any additional questions or concerns.  She can resume her usual home medications.  Consults: none  Significant Diagnostic Studies:  Results for orders placed or performed during the hospital encounter of 11/04/18  ABO/Rh  Result Value Ref Range   ABO/RH(D)      O POS Performed at [facility], [place] [address], [place], [place] [address]   Laboratory studies from 6/15 revealed WBC 6, hemoglobin 12.7, platelets 161k, creatinine 0.86, total bilirubin 0.4, PT 12.7  INR 1.0.  COVID-19 negative.   Treatments: CT-guided microwave ablation of right liver (seg 7/8) lesion via general anesthesia on 11/06/2018  Discharge Exam: Blood pressure (!) 98/59, pulse 67, temperature 98 F (36.7 C), temperature source Oral, resp. rate 18, height 5' (1.524 m), weight 96 lb 3.2 oz (43.6 kg), SpO2 100 %. Awake, alert.  Chest clear to auscultation bilaterally.  Intact left chest wall Port-A-Cath.  Heart with regular rate and rhythm.  Abdomen soft, positive bowel sounds, puncture site right upper quadrant nontender.  Clean  dressing intact.  No lower extremity edema.  Disposition: Discharge disposition: 01-Home or Self Care       Discharge Instructions    Call MD for:  difficulty breathing, headache or visual disturbances   Complete by: As directed    Call MD for:  extreme fatigue   Complete by: As directed    Call MD for:  hives   Complete by: As directed    Call MD for:  persistant dizziness or light-headedness   Complete by: As directed    Call MD for:  persistant nausea and vomiting   Complete by: As directed    Call MD for:  redness, tenderness, or signs of infection (pain, swelling, redness, odor or green/yellow discharge around incision site)   Complete by: As directed    Call MD for:  severe uncontrolled pain   Complete by: As directed    Call MD for:  temperature >100.4   Complete by: As directed    Change dressing (specify)   Complete by: As directed    May apply bandage to puncture site right upper abdominal region for the next 2 to 3 days.  May wash site with soap and water.   Diet - low sodium heart healthy   Complete by: As directed    Discharge instructions   Complete by: As directed    Stay well-hydrated,  current home medications   Driving Restrictions   Complete by: As directed    No driving for next 24 hours or after taking narcotic medication   Increase activity slowly   Complete by: As directed    Lifting restrictions   Complete by: As directed    Avoid heavy lifting for the next 3 to 4 days   May shower / Bathe   Complete by: As directed    May walk up steps   Complete by: As directed      Allergies as of 11/07/2018   No Known Allergies     Medication List    TAKE these medications   acetaminophen 325 MG tablet Commonly known as: TYLENOL Take 2 tablets (650 mg total) by mouth every 6 (six) hours as needed for mild pain (or Fever >/= 101).   benazepril 20 MG tablet Commonly known as: LOTENSIN TAKE 1 TABLET DAILY What changed: when to take this   CALCIUM  600/VITAMIN D PO Take 1 tablet by mouth every evening.   diphenoxylate-atropine 2.5-0.025 MG tablet Commonly known as: LOMOTIL Take 1 tablet by mouth 4 (four) times daily as needed for diarrhea or loose stools. Take it along with immodium   DULoxetine 20 MG capsule Commonly known as: CYMBALTA Take 2 capsules (40 mg total) by mouth daily. What changed:   how much to take  when to take this   feeding supplement Liqd Take 1 Container by mouth daily.   fentaNYL 25 MCG/HR Commonly known as: [place] 1 patch onto the skin every 3 (three) days.   ferrous sulfate 325 (65 FE) MG tablet Commonly known as: FerrouSul Take 1 tablet (325 mg total) by mouth daily with breakfast.  gabapentin 300 MG capsule Commonly known as: NEURONTIN One pill in am; and 2 pills at night prior to sleep. What changed:   how much to take  how to take this  when to take this  additional instructions   hydrochlorothiazide 12.5 MG capsule Commonly known as: MICROZIDE Take 1 capsule (12.5 mg total) by mouth daily. What changed: when to take this   HYDROcodone-acetaminophen 5-325 MG tablet Commonly known as: NORCO/VICODIN Take 1 tablet by mouth every 12 (twelve) hours as needed for moderate pain. What changed:   when to take this  reasons to take this   lidocaine-prilocaine cream Commonly known as: EMLA Apply 1 application topically as needed. Apply generously over the Mediport 45 minutes prior to chemotherapy. What changed:   reasons to take this  additional instructions   loperamide 2 MG capsule Commonly known as: IMODIUM Take 2 mg by mouth 4 (four) times daily as needed for diarrhea or loose stools.   multivitamin with minerals Tabs tablet Take 1 tablet by mouth daily.   ondansetron 8 MG tablet Commonly known as: ZOFRAN One pill every 8 hours as needed for nausea/vomitting. What changed:   how much to take  how to take this  when to take this  reasons to take this   additional instructions   potassium chloride SA 20 MEQ tablet Commonly known as: K-DUR 1 pill twice a day What changed:   how much to take  how to take this  when to take this  additional instructions   prochlorperazine 10 MG tablet Commonly known as: COMPAZINE Take 1 tablet (10 mg total) by mouth every 6 (six) hours as needed for nausea or vomiting.            Discharge Care Instructions  (From admission, onward)         Start     Ordered   11/07/18 0000  Change dressing (specify)    Comments: May apply bandage to puncture site right upper abdominal region for the next 2 to 3 days.  May wash site with soap and water.   11/07/18 1052         Follow-up Information    Cammie Sickle, MD Follow up.   Specialties: Internal Medicine, Oncology Why: Follow-up with Dr. Rogue Bussing as scheduled Contact information: [place] [address] [phone]        Corrie Mckusick, DO Follow up.   Specialties: Interventional Radiology, Radiology Why: Radiology service will call you with follow-up appointment with Dr. Earleen Newport in [place] clinic in 3 to 4 weeks. Call [phone] or [phone] with any questions Contact information: [place] [address] [phone]            Electronically Signed: D. Rowe Robert, PA-C 11/07/2018, 10:54 AM   I have spent Less Than 30 Minutes discharging Nancy Clark.

## 2018-11-07 NOTE — Progress Notes (Signed)
PIV removed, pt tolerated well. Discharge instructions reviewed with pt and printed copy provided. Pt denies any questions or concerns. Discharged to home with family

## 2018-11-07 NOTE — Discharge Instructions (Signed)
Radiofrequency /Microwave Ablation of Liver Tumors, Care After This sheet gives you information about how to care for yourself after your procedure. Your health care provider may also give you more specific instructions. If you have problems or questions, contact your health care provider. What can I expect after the procedure? After your procedure, it is common to have pain and discomfort in the upper abdomen. You will be given pain medicines to control this. Follow these instructions at home: Incision care   Follow instructions from your health care provider about how to take care of your incision. Make sure you: ? Wash your hands with soap and water before you change your bandage (dressing). If soap and water are not available, use hand sanitizer. ? Change your dressing as told by your health care provider. ? Leave stitches (sutures), skin glue, or adhesive strips in place. These skin closures may need to stay in place for 2 weeks or longer. If adhesive strip edges start to loosen and curl up, you may trim the loose edges. Do not remove adhesive strips completely unless your health care provider tells you to do that.  Check your incision area every day for signs of infection. Check for: ? Redness, swelling, or pain. ? Fluid or blood. ? Warmth. ? Pus or a bad smell. Medicines  Take over-the-counter and prescription medicines only as told by your health care provider.  If you were prescribed an antibiotic medicine, use it as told by your health care provider. Do not stop using the antibiotic even if you start to feel better.  Do not drive or use heavy machinery while taking prescription pain medicine. Activity  Rest as often as needing during the first few days of recovery.  Do not lift anything that is heavier than 10 lbs (4.5 kg), or the limit that your health care provider tells you, until he or she says that it is safe.  Do not drive for 24 hours after the procedure if you were  given a medicine to help you relax (sedative). General instructions  Do not take baths, swim, or use a hot tub until your health care provider approves.  Follow any special diet instructions as directed by your health care provider.  To prevent or treat constipation while you are taking prescription pain medicine, your health care provider may recommend that you: ? Drink enough fluid to keep your urine clear or pale yellow. ? Take over-the-counter or prescription medicines. ? Eat foods that are high in fiber, such as fresh fruits and vegetables, whole grains, and beans. ? Limit foods that are high in fat and processed sugars, such as fried and sweet foods.  Do not use any products that contain nicotine or tobacco, such as cigarettes and e-cigarettes. If you need help quitting, ask your health care provider.  Keep all follow-up visits as told by your health care provider. This is important. Contact a health care provider if:  You cannot pass gas.  You are unable to have a bowel movement within 2 days.  You have a skin rash. Get help right away if:  You have a fever.  You have severe or lasting pain in your abdomen, shoulder, or back.  You have trouble swallowing or breathing.  You have severe weakness or dizziness.  You have chest pain or shortness of breath. Summary  After your procedure, it is common to have pain and discomfort in the upper abdomen. You will be given pain medicines to control this.  Do  not take baths, swim, or use a hot tub until your health care provider approves.  Do not drive or use heavy machinery while taking prescription pain medicine. This information is not intended to replace advice given to you by your health care provider. Make sure you discuss any questions you have with your health care provider. Document Released: 02/26/2013 Document Revised: 07/27/2016 Document Reviewed: 07/27/2016 Elsevier Interactive Patient Education  2019 [employer].

## 2018-11-08 ENCOUNTER — Other Ambulatory Visit: Payer: Self-pay

## 2018-11-08 ENCOUNTER — Telehealth: Payer: Self-pay | Admitting: Internal Medicine

## 2018-11-08 NOTE — Telephone Encounter (Signed)
Spoke to pt and completed travel screen. Also explained about addl screening questions they will be asked, new guidelines about mask req, no visitors, and fever checks °

## 2018-11-11 ENCOUNTER — Telehealth: Payer: Self-pay | Admitting: Pharmacist

## 2018-11-11 ENCOUNTER — Inpatient Hospital Stay: Payer: BC Managed Care – PPO

## 2018-11-11 ENCOUNTER — Telehealth: Payer: Self-pay | Admitting: Pharmacy Technician

## 2018-11-11 ENCOUNTER — Other Ambulatory Visit: Payer: Self-pay

## 2018-11-11 ENCOUNTER — Inpatient Hospital Stay (HOSPITAL_BASED_OUTPATIENT_CLINIC_OR_DEPARTMENT_OTHER): Payer: BC Managed Care – PPO | Admitting: Internal Medicine

## 2018-11-11 VITALS — BP 106/68 | HR 71

## 2018-11-11 DIAGNOSIS — C7951 Secondary malignant neoplasm of bone: Secondary | ICD-10-CM

## 2018-11-11 DIAGNOSIS — I10 Essential (primary) hypertension: Secondary | ICD-10-CM | POA: Diagnosis not present

## 2018-11-11 DIAGNOSIS — C772 Secondary and unspecified malignant neoplasm of intra-abdominal lymph nodes: Secondary | ICD-10-CM

## 2018-11-11 DIAGNOSIS — E876 Hypokalemia: Secondary | ICD-10-CM | POA: Diagnosis not present

## 2018-11-11 DIAGNOSIS — C182 Malignant neoplasm of ascending colon: Secondary | ICD-10-CM

## 2018-11-11 DIAGNOSIS — Z95828 Presence of other vascular implants and grafts: Secondary | ICD-10-CM

## 2018-11-11 DIAGNOSIS — G620 Drug-induced polyneuropathy: Secondary | ICD-10-CM

## 2018-11-11 LAB — CBC WITH DIFFERENTIAL/PLATELET
Abs Immature Granulocytes: 0.06 10*3/uL (ref 0.00–0.07)
Basophils Absolute: 0 10*3/uL (ref 0.0–0.1)
Basophils Relative: 0 %
Eosinophils Absolute: 0.1 10*3/uL (ref 0.0–0.5)
Eosinophils Relative: 1 %
HCT: 34.7 % — ABNORMAL LOW (ref 36.0–46.0)
Hemoglobin: 11.2 g/dL — ABNORMAL LOW (ref 12.0–15.0)
Immature Granulocytes: 1 %
Lymphocytes Relative: 8 %
Lymphs Abs: 0.8 10*3/uL (ref 0.7–4.0)
MCH: 30.4 pg (ref 26.0–34.0)
MCHC: 32.3 g/dL (ref 30.0–36.0)
MCV: 94.3 fL (ref 80.0–100.0)
Monocytes Absolute: 1.1 10*3/uL — ABNORMAL HIGH (ref 0.1–1.0)
Monocytes Relative: 10 %
Neutro Abs: 8.6 10*3/uL — ABNORMAL HIGH (ref 1.7–7.7)
Neutrophils Relative %: 80 %
Platelets: 116 10*3/uL — ABNORMAL LOW (ref 150–400)
RBC: 3.68 MIL/uL — ABNORMAL LOW (ref 3.87–5.11)
RDW: 15.8 % — ABNORMAL HIGH (ref 11.5–15.5)
WBC: 10.7 10*3/uL — ABNORMAL HIGH (ref 4.0–10.5)
nRBC: 0 % (ref 0.0–0.2)

## 2018-11-11 LAB — URINALYSIS, COMPLETE (UACMP) WITH MICROSCOPIC
Bilirubin Urine: NEGATIVE
Glucose, UA: NEGATIVE mg/dL
Hgb urine dipstick: NEGATIVE
Ketones, ur: NEGATIVE mg/dL
Leukocytes,Ua: NEGATIVE
Nitrite: NEGATIVE
Protein, ur: 30 mg/dL — AB
Specific Gravity, Urine: 1.017 (ref 1.005–1.030)
pH: 5 (ref 5.0–8.0)

## 2018-11-11 LAB — COMPREHENSIVE METABOLIC PANEL
ALT: 70 U/L — ABNORMAL HIGH (ref 0–44)
AST: 28 U/L (ref 15–41)
Albumin: 3.7 g/dL (ref 3.5–5.0)
Alkaline Phosphatase: 55 U/L (ref 38–126)
Anion gap: 12 (ref 5–15)
BUN: 11 mg/dL (ref 6–20)
CO2: 25 mmol/L (ref 22–32)
Calcium: 8.7 mg/dL — ABNORMAL LOW (ref 8.9–10.3)
Chloride: 96 mmol/L — ABNORMAL LOW (ref 98–111)
Creatinine, Ser: 0.92 mg/dL (ref 0.44–1.00)
GFR calc Af Amer: >60 mL/min (ref >60)
GFR calc non Af Amer: >60 mL/min (ref >60)
Glucose, Bld: 125 mg/dL — ABNORMAL HIGH (ref 70–99)
Potassium: 3.2 mmol/L — ABNORMAL LOW (ref 3.5–5.1)
Sodium: 133 mmol/L — ABNORMAL LOW (ref 135–145)
Total Bilirubin: 0.6 mg/dL (ref 0.3–1.2)
Total Protein: 7.1 g/dL (ref 6.5–8.1)

## 2018-11-11 MED ORDER — SODIUM CHLORIDE 0.9 % IV SOLN
Freq: Once | INTRAVENOUS | Status: AC
  Administered 2018-11-11: 11:00:00 via INTRAVENOUS
  Filled 2018-11-11: qty 250

## 2018-11-11 MED ORDER — TRIFLURIDINE-TIPIRACIL 20-8.19 MG PO TABS: ORAL_TABLET | ORAL | 6 refills | Status: DC

## 2018-11-11 MED ORDER — FENTANYL 25 MCG/HR TD PT72: 1.0000 | MEDICATED_PATCH | TRANSDERMAL | 0 refills | Status: DC

## 2018-11-11 MED ORDER — SODIUM CHLORIDE 0.9 % IV SOLN
2000.0000 mg/m2 | INTRAVENOUS | Status: DC
  Administered 2018-11-11: 2900 mg via INTRAVENOUS
  Filled 2018-11-11: qty 50

## 2018-11-11 MED ORDER — SODIUM CHLORIDE 0.9% FLUSH
10.0000 mL | Freq: Once | INTRAVENOUS | Status: AC
  Administered 2018-11-11: 10:00:00 10 mL via INTRAVENOUS
  Filled 2018-11-11: qty 10

## 2018-11-11 MED ORDER — ONDANSETRON HCL 4 MG/2ML IJ SOLN
8.0000 mg | Freq: Once | INTRAMUSCULAR | Status: AC
  Administered 2018-11-11: 8 mg via INTRAVENOUS
  Filled 2018-11-11: qty 4

## 2018-11-11 MED ORDER — DEXAMETHASONE SODIUM PHOSPHATE 10 MG/ML IJ SOLN
10.0000 mg | Freq: Once | INTRAMUSCULAR | Status: AC
  Administered 2018-11-11: 10 mg via INTRAVENOUS
  Filled 2018-11-11: qty 1

## 2018-11-11 MED ORDER — SODIUM CHLORIDE 0.9 % IV SOLN
225.0000 mg | INTRAVENOUS | Status: DC
  Administered 2018-11-11: 225 mg via INTRAVENOUS
  Filled 2018-11-11: qty 9

## 2018-11-11 NOTE — Progress Notes (Signed)
Patient has lost 5 pounds since last visit and reports not having an appetite.   Pain is 5/10 on pain scale today that is located lower back and sometimes radiates to right shoulder (requesting refill on Fentanyl patch today).  Diarrhea has not improved and happens 2-3 times a day about 3 times a week.

## 2018-11-11 NOTE — Progress Notes (Signed)
Okay to proceed with treatment with urine protein from 10/28/2018 per Dr. Rogue Bussing.

## 2018-11-11 NOTE — Telephone Encounter (Signed)
Oral Oncology Pharmacist Encounter  Received new prescription for Lonsurf (trifluridine/tipiracil) for the treatment of metastatic colon cancer, planned duration until disease progression or unacceptable drug toxicity.  CBC from 11/11/2018 assessed, no relevant lab abnormalities. Prescription dose and frequency assessed.   Current medication list in Epic reviewed, no DDIs with Lonsurf identified.  Prescription has been e-scribed to the [facility] for benefits analysis and approval.  Oral Oncology Clinic will continue to follow for insurance authorization, copayment issues, initial counseling and start date.  Darl Pikes, PharmD, BCPS, [facility] Hematology/Oncology Clinical Pharmacist [facility]/HP/AP Oral [place] [facility] [phone]  11/11/2018 1:36 PM

## 2018-11-11 NOTE — Progress Notes (Signed)
Nutrition Assessment:  Verbal referral from Dr. Jacinto Reap regarding weight loss  58 year old female with stage IV colon cancer with metastatic disease to L2 and liver.  Past medical history of partial colectomy, HTN, peripheral neuropathy.   Patient receiving 5FU and avastin then planning lonsurf.    Spoke with patient during infusion today.  Patient reports that appetite is usually decreased for 2-3 days after chemotherapy and then starts picking back up.  Has nausea and takes medication to help.  Also reports diarrhea several episodes per day 2-3 times per week.  Reports that this has been ongoing since surgery.  Reports that she takes imodium  Reports that she usually eats egg, toast and bacon for breakfast, boost shake for lunch and dinner meat and vegetables.    Medications: calcium vit D, MVI, imodium zofran and compazine, lomotil, fesulfate  Labs: K 3.2, Na 133, glucose 125  Anthropometrics:   Height: 60 inches Weight: 97 lb 14.8 oz UBW: 115 lb before surgery per patient Noted 106 lb 1 year ago BMI: 19  8% weight loss in the last year.      Estimated Energy Needs  Kcals: 1320-1500 calories Protein: 66-75 g Fluid: 1.5 L  NUTRITION DIAGNOSIS: Inadequate oral intake related to cancer related treatment side effects as evidenced by nausea and decreased appetite for several days after treatment   INTERVENTION:  Discussed strategies to help with diarrhea.  Handout provided Encouraged eating every 2-3 hours Encouraged good source of protein at every meal and examples provided Encourage oral nutrition supplements.  Samples of different shakes given to patient to try today.  Contact information provided    MONITORING, EVALUATION, GOAL: Patient will consume adequate calories and protein to maintain weight   NEXT VISIT: July 6  Manpreet Strey B. Zenia Resides, [place], [place] Registered Dietitian [phone] (pager)

## 2018-11-11 NOTE — Telephone Encounter (Addendum)
Oral Oncology Patient Advocate Encounter  Received notification from [facility] that prior authorization for Lonsurf is required.  PA submitted on CoverMyMeds Key [id number] Status is pending  Oral Oncology Clinic will continue to follow.  [place] Patient [place] Phone [phone] Fax [phone] 11/12/2018 10:19 AM

## 2018-11-11 NOTE — Progress Notes (Signed)
[name], No Pcp Per as PCP - General (General Practice)  Cancer Staging No matching staging information was found for the patient.   Oncology History Overview Note  # [facility] 2017- COLON CANCER- STAGE IV [s/p right hemi-colectomy; pT4apN2 (7/19LN)]; Pre-op CEA- [id number]  PET-  mesenteric adenopathy/mediastinal adenopathy/right-sided subclavicular lymph node.    April 17th -START FOLFOX; May 1st Add Avastin;   Aug 16th- Disc OX [sec PN]; con 5FU-Avastin; NOV 27th CT C/A/P- CR; except for 59m LUL; 5FU-Avastin q 3 W [Poor tolerance to FOLFOX].  # June/July 2019- Progression of Liver/Abd LN; July 2019- START FOLFIRI + Avastin; STOP iri 02/25/2018- sec to diarrhea; cont Avastin + 5FU  # March 2018- L2 uptake MET s/p RT [[facility]]; mid April X-geva  # L1 ablation/ostecool-  Jan 3rd 2020-   # march 2nd 2020- decrease 5FU LET [place]5 CUV  to 20012mm2/  #June 17th, 2020-liver ablation; 3.5 cm lesion.  Intolerance to 5-FU plus Avastin.  Discontinue  #July second week-start Lonsurf   # MOLECULAR TESTING: K-ras-EXON 2- MUTATED; MSI- STABLE.   DIAGNOSIS: colon ca  STAGE:  IV ;GOALS: pallaitive  CURRENT/MOST RECENT THERAPY: 5FU+Avastin    Cancer of ascending colon metastatic to intra-abdominal lymph node (HCC)      INTERVAL HISTORY:  Nancy Clark.o.  female pleasant patient above history of metastatic adenocarcinoma of the colon currently on 5-FU-avastin is here for follow-up.  [facility]had ablation done of the liver lesion last week.  Procedure was uneventful.  She admits to poor appetite.  Denies abdominal pain.  Has about 5 pounds weight loss.  Continues to have intermittent diarrhea 1-2 loose stools a day.  She continues to have hoarseness of voice. No lumps or bumps in the neck.  Denies any nausea vomiting.  Denies abdominal pain.   Denies any significant weight loss.  Continues to chronic back pain.  No worsening tingling and numbness in extremities.   Review of Systems  Constitutional: Positive for malaise/fatigue and weight loss. Negative for chills, diaphoresis and fever.  HENT: Negative for nosebleeds and sore throat.   Eyes: Negative for double vision.  Respiratory: Negative for cough, hemoptysis, sputum production, shortness of breath and wheezing.   Cardiovascular: Negative for chest pain, palpitations, orthopnea and leg swelling.  Gastrointestinal: Negative for abdominal pain, constipation, heartburn, melena, nausea and vomiting.  Genitourinary: Negative for dysuria, frequency and urgency.  Musculoskeletal: Positive for back pain. Negative for joint pain.  Skin: Negative.  Negative for itching and rash.  Neurological: Positive for tingling. Negative for dizziness, focal weakness, weakness and headaches.  Endo/Heme/Allergies: Does not bruise/bleed easily.  Psychiatric/Behavioral: Negative for depression. The patient is not nervous/anxious and does not have insomnia.       PAST MEDICAL HISTORY :  Past Medical History:  Diagnosis Date  . Chicken pox   . [place]   Partial colectomy 07/2015 + chemo tx's  . Hypertension   . Hypokalemia   . Menopause    > 5 yrs  . [place]   bi [place]    PAST SURGICAL HISTORY :   Past Surgical History:  Procedure Laterality Date  . BREAST BIOPSY Right    2007? pt unsure done by [facility]  . COLONOSCOPY    . COLONOSCOPY WITH PROPOFOL N/A 10/14/2015   Procedure: COLONOSCOPY WITH PROPOFOL;  Surgeon:  Hulen Luster, MD;  Location[facility]COPY;  Service: Endoscopy;  Laterality: N/A;  . COLOSTOMY REVISION Right 08/09/2015   Procedure: COLON RESECTION RIGHT;  Surgeon: Florene Glen, MD;  Location: [facility] ORS;  Service: General;  Laterality: Right;  . ECTOPIC PREGNANCY SURGERY    . IR BONE TUMOR(S)RF ABLATION  05/24/2018  . IR KYPHO  LUMBAR INC FX REDUCE BONE BX UNI/BIL CANNULATION INC/IMAGING  05/24/2018  . IR RADIOLOGIST EVAL & MGMT  05/08/2018  . IR RADIOLOGIST EVAL & MGMT  06/18/2018  . IR RADIOLOGIST EVAL & MGMT  07/31/2018  . PORTACATH PLACEMENT N/A 09/01/2015   Procedure: INSERTION PORT-A-CATH;  Surgeon: Florene Glen, MD;  Location: [facility] ORS;  Service: General;  Laterality: N/A;  . RADIOLOGY WITH ANESTHESIA N/A 11/06/2018   Procedure: CT-MICROWAVE THERMAL ABLATION/LIVER;  Surgeon: Corrie Mckusick, DO;  Location: [facility] ORS;  Service: Anesthesiology;  Laterality: N/A;  . SHOULDER SURGERY Left     FAMILY HISTORY :   Family History  Problem Relation Age of Onset  . Hypertension Mother   . Arthritis Father   . Prostate cancer Father   . Diabetes Maternal Grandmother   . Colon cancer Maternal Grandfather        colon    SOCIAL HISTORY:   Social History   Tobacco Use  . Smoking status: Former Smoker    Packs/day: 0.25    Years: 2.00    Pack years: 0.50  . Smokeless tobacco: Never Used  . Tobacco comment: recently quit  Substance Use Topics  . Alcohol use: No    Alcohol/week: 0.0 standard drinks  . Drug use: No    ALLERGIES:  has No Known Allergies.  MEDICATIONS:  Current Outpatient Medications  Medication Sig Dispense Refill  . acetaminophen (TYLENOL) 325 MG tablet Take 2 tablets (650 mg total) by mouth every 6 (six) hours as needed for mild pain (or Fever >/= 101).    . benazepril (LOTENSIN) 20 MG tablet TAKE 1 TABLET DAILY (Patient taking differently: Take 20 mg by mouth at bedtime. ) 90 tablet 4  . Calcium Carbonate-Vitamin D (CALCIUM 600/VITAMIN D PO) Take 1 tablet by mouth every evening.     . diphenoxylate-atropine (LOMOTIL) 2.5-0.025 MG tablet Take 1 tablet by mouth 4 (four) times daily as needed for diarrhea or loose stools. Take it along with immodium 40 tablet 4  . DULoxetine (CYMBALTA) 20 MG capsule Take 2 capsules (40 mg total) by mouth daily. (Patient taking differently: Take 20 mg by mouth 2  (two) times daily. ) 180 capsule 3  . feeding supplement (BOOST HIGH PROTEIN) LIQD Take 1 Container by mouth daily.     . fentaNYL (DURAGESIC) 25 MCG/HR Place 1 patch onto the skin every 3 (three) days. 10 patch 0  . ferrous sulfate (FERROUSUL) 325 (65 FE) MG tablet Take 1 tablet (325 mg total) by mouth daily with breakfast. 90 tablet 3  . gabapentin (NEURONTIN) 300 MG capsule One pill in am; and 2 pills at night prior to sleep. (Patient taking differently: Take 300-600 mg by mouth See admin instructions. Take 1 capsule (300 mg) by mouth daily in the morning & take 2 capsules (600 mg) by mouth at night.) 90 capsule 3  . hydrochlorothiazide (MICROZIDE) 12.5 MG capsule Take 1 capsule (12.5 mg total) by mouth daily. (Patient taking differently: Take 12.5 mg by mouth at bedtime. ) 30 capsule 6  . HYDROcodone-acetaminophen (NORCO/VICODIN) 5-325 MG tablet Take 1 tablet by mouth every 12 (twelve) hours as  needed for moderate pain. (Patient taking differently: Take 1 tablet by mouth 2 (two) times daily as needed (pain.). ) 60 tablet 0  . lidocaine-prilocaine (EMLA) cream Apply 1 application topically as needed. Apply generously over the Mediport 45 minutes prior to chemotherapy. (Patient taking differently: Apply 1 application topically as needed (prior to chemotherapy.). ) 30 g 6  . loperamide (IMODIUM) 2 MG capsule Take 2 mg by mouth 4 (four) times daily as needed for diarrhea or loose stools.     . Multiple Vitamin (MULTIVITAMIN WITH MINERALS) TABS tablet Take 1 tablet by mouth daily.    . ondansetron (ZOFRAN) 8 MG tablet One pill every 8 hours as needed for nausea/vomitting. (Patient taking differently: Take 8 mg by mouth every 8 (eight) hours as needed for nausea or vomiting. ) 60 tablet 0  . potassium chloride SA (K-DUR,KLOR-CON) 20 MEQ tablet 1 pill twice a day (Patient taking differently: Take 20 mEq by mouth 2 (two) times daily. ) 60 tablet 3  . prochlorperazine (COMPAZINE) 10 MG tablet Take 1 tablet  (10 mg total) by mouth every 6 (six) hours as needed for nausea or vomiting. 90 tablet 3   No current facility-administered medications for this visit.    Facility-Administered Medications Ordered in Other Visits  Medication Dose Route Frequency Provider Last Rate Last Dose  . heparin lock flush 100 unit/mL  500 Units Intravenous Once Charlaine Dalton R, MD      . sodium chloride flush (NS) 0.9 % injection 10 mL  10 mL Intravenous PRN Cammie Sickle, MD   10 mL at 10/04/15 0901  . sodium chloride flush (NS) 0.9 % injection 10 mL  10 mL Intravenous PRN Cammie Sickle, MD   10 mL at 07/02/17 1020    PHYSICAL EXAMINATION: ECOG PERFORMANCE STATUS: 1 - Symptomatic but completely ambulatory  BP 116/76   Pulse 92   Temp 97.7 F (36.5 C)   Resp 16   Wt 97 lb 4.8 oz (44.1 kg)   LMP  (LMP Unknown) Comment: LMP MORE THAN 5 YRS  BMI 19.00 kg/m   Filed Weights   11/11/18 1021  Weight: 97 lb 4.8 oz (44.1 kg)    Physical Exam  Constitutional: She is oriented to person, place, and time and well-developed, well-nourished, and in no distress.  She is alone. She is walking herself.  HENT:  Head: Normocephalic and atraumatic.  Mouth/Throat: Oropharynx is clear and moist. No oropharyngeal exudate.  Eyes: Pupils are equal, round, and reactive to light.  Neck: Normal range of motion. Neck supple.  Cardiovascular: Normal rate and regular rhythm.  Pulmonary/Chest: No respiratory distress. She has no wheezes.  Abdominal: Soft. Bowel sounds are normal. She exhibits no distension and no mass. There is no abdominal tenderness. There is no rebound and no guarding.  Musculoskeletal: Normal range of motion.        General: No tenderness or edema.  Neurological: She is alert and oriented to person, place, and time.  Skin: Skin is warm.  Psychiatric: Affect normal.       LABORATORY DATA:  I have reviewed the data as listed    Component Value Date/Time   NA 133 (L) 11/11/2018 0930    K 3.2 (L) 11/11/2018 0930   CL 96 (L) 11/11/2018 0930   CO2 25 11/11/2018 0930   GLUCOSE 125 (H) 11/11/2018 0930   BUN 11 11/11/2018 0930   CREATININE 0.92 11/11/2018 0930   CALCIUM 8.7 (L) 11/11/2018 0930   PROT  7.1 11/11/2018 0930   ALBUMIN 3.7 11/11/2018 0930   AST 28 11/11/2018 0930   ALT 70 (H) 11/11/2018 0930   ALKPHOS 55 11/11/2018 0930   BILITOT 0.6 11/11/2018 0930   GFRNONAA >60 11/11/2018 0930   GFRAA >60 11/11/2018 0930    No results found for: SPEP, UPEP  Lab Results  Component Value Date   WBC 10.7 (H) 11/11/2018   NEUTROABS 8.6 (H) 11/11/2018   HGB 11.2 (L) 11/11/2018   HCT 34.7 (L) 11/11/2018   MCV 94.3 11/11/2018   PLT 116 (L) 11/11/2018      Chemistry      Component Value Date/Time   NA 133 (L) 11/11/2018 0930   K 3.2 (L) 11/11/2018 0930   CL 96 (L) 11/11/2018 0930   CO2 25 11/11/2018 0930   BUN 11 11/11/2018 0930   CREATININE 0.92 11/11/2018 0930      Component Value Date/Time   CALCIUM 8.7 (L) 11/11/2018 0930   ALKPHOS 55 11/11/2018 0930   AST 28 11/11/2018 0930   ALT 70 (H) 11/11/2018 0930   BILITOT 0.6 11/11/2018 0930       RADIOGRAPHIC STUDIES: I have personally reviewed the radiological images as listed and agreed with the findings in the report. No results found.   ASSESSMENT & PLAN:  Cancer of ascending colon metastatic to intra-abdominal lymph node [facility]) # Colon cancer- cecal/ right-sided; STAGE IV; JUne 2020- MRI increasing lesion in liver ~3.5cm [see below].  CEA -25 slowly rising; at 25.   # proceed with 5FU-avastin infusion only today; will plan to change therapies- to lonsurf given the poor tolerance to therapy.  Discussed the potential side effects including but not limited to diarrhea; mucositis; pain etc.  # Hoarseness of voice-unclear etiology.  Commend check CT neck/Chest CT-   # diarrhea- G-1-2; continue lomotil/ imodium-stable  #Weight loss-5 pounds the last 2 weeks.  Recommend evaluation with Almyra Free.   # L2  metastases status post ablation [Jan third]-on fentanyl/hydrocodone as needed stable.  # Hypokalemia- 3.7 stable.   # Peripheral neuropathy - stable.    # DISPOSITION:  # Treatment today; CIV 5FU; avastin    # Schedule CT Chest/neck  In 1 week # refrral to [place]-  #  Follow up in  2 weeks; MD/ labs- cbc/cmp/cea; NO chemo-Dr.B  Cc; alyson-     Orders Placed This Encounter  Procedures  . CT CHEST W CONTRAST    Standing Status:   Future    Standing Expiration Date:   11/11/2019    Order Specific Question:   If indicated for the ordered procedure, I authorize the administration of contrast media per Radiology protocol    Answer:   Yes    Order Specific Question:   Preferred imaging location?    Answer:   [facility]    Order Specific Question:   Radiology Contrast Protocol - do NOT remove file path    Answer:   \\[email or web address]    Order Specific Question:   ** REASON FOR EXAM (FREE TEXT)    Answer:   horasenss of voice; colon cancer metastatic    Order Specific Question:   Is patient pregnant?    Answer:   No  . CT SOFT TISSUE NECK W CONTRAST    Standing Status:   Future    Standing Expiration Date:   02/11/2020    Order Specific Question:   ** REASON FOR EXAM (FREE TEXT)    Answer:   hoarsenss of voce  Order Specific Question:   If indicated for the ordered procedure, I authorize the administration of contrast media per Radiology protocol    Answer:   Yes    Order Specific Question:   Is patient pregnant?    Answer:   No    Order Specific Question:   Preferred imaging location?    Answer:   [facility]    Order Specific Question:   Radiology Contrast Protocol - do NOT remove file path    Answer:   \\[email or web address]  . CBC with Differential    Standing Status:   Future    Standing Expiration Date:   11/11/2019  . Comprehensive metabolic panel    Standing Status:   Future    Standing Expiration Date:    11/11/2019  . CEA    Standing Status:   Future    Standing Expiration Date:   11/11/2019   All questions were answered. The patient knows to call the clinic with any problems, questions or concerns.      Cammie Sickle, MD 11/11/2018 1:15 PM

## 2018-11-11 NOTE — Assessment & Plan Note (Addendum)
#   Colon cancer- cecal/ right-sided; STAGE IV; JUne 2020- MRI increasing lesion in liver ~3.5cm [see below].  CEA -25 slowly rising; at 25.   # proceed with 5FU-avastin infusion only today; will plan to change therapies- to lonsurf given the poor tolerance to therapy.  Discussed the potential side effects including but not limited to diarrhea; mucositis; pain etc.  # Hoarseness of voice-unclear etiology.  Commend check CT neck/Chest CT-   # diarrhea- G-1-2; continue lomotil/ imodium-stable  #Weight loss-5 pounds the last 2 weeks.  Recommend evaluation with Almyra Free.   # L2 metastases status post ablation [Jan third]-on fentanyl/hydrocodone as needed stable.  # Hypokalemia- 3.7 stable.   # Peripheral neuropathy - stable.    # DISPOSITION:  # Treatment today; CIV 5FU; avastin    # Schedule CT Chest/neck  In 1 week # refrral to [facility]-  #  Follow up in  2 weeks; MD/ labs- cbc/cmp/cea; NO chemo-Dr.B  Cc; alyson-

## 2018-11-12 LAB — CEA: CEA: 22.5 ng/mL — ABNORMAL HIGH (ref 0.0–4.7)

## 2018-11-13 ENCOUNTER — Other Ambulatory Visit: Payer: Self-pay

## 2018-11-13 ENCOUNTER — Inpatient Hospital Stay: Payer: BC Managed Care – PPO

## 2018-11-13 VITALS — BP 105/68 | HR 96 | Temp 97.7°F | Resp 18

## 2018-11-13 DIAGNOSIS — C772 Secondary and unspecified malignant neoplasm of intra-abdominal lymph nodes: Secondary | ICD-10-CM

## 2018-11-13 DIAGNOSIS — C182 Malignant neoplasm of ascending colon: Secondary | ICD-10-CM

## 2018-11-13 MED ORDER — SODIUM CHLORIDE 0.9% FLUSH
10.0000 mL | INTRAVENOUS | Status: DC | PRN
  Administered 2018-11-13: 10 mL
  Filled 2018-11-13: qty 10

## 2018-11-13 MED ORDER — HEPARIN SOD (PORK) LOCK FLUSH 100 UNIT/ML IV SOLN
500.0000 [IU] | Freq: Once | INTRAVENOUS | Status: AC | PRN
  Administered 2018-11-13: 500 [IU]
  Filled 2018-11-13: qty 5

## 2018-11-14 MED ORDER — TRIFLURIDINE-TIPIRACIL 20-8.19 MG PO TABS: ORAL_TABLET | ORAL | 6 refills | Status: DC

## 2018-11-14 NOTE — Telephone Encounter (Signed)
Oral Oncology Patient Advocate Encounter  Prior Authorization for Nancy Clark has been approved.    PA# [id number] Effective dates: 09/14/2018 through 11/13/2019  Patient must use [facility] per insurance.    Oral Oncology Clinic will continue to follow.   Nancy Clark Patient [place] Phone [phone] Fax [phone] 11/14/2018 3:34 PM

## 2018-11-14 NOTE — Telephone Encounter (Signed)
Oral Chemotherapy Pharmacist Encounter  Due to insurance restriction the medication could not be filled at [place]. Prescription has been e-scribed to [place].  Supportive information was faxed to [place]. We will continue to follow medication access.   Darl Pikes, PharmD, BCPS, [facility] Hematology/Oncology Clinical Pharmacist [facility] [place] [facility] [phone]  11/14/2018 3:51 PM

## 2018-11-18 NOTE — Telephone Encounter (Signed)
Called to check the status of prescription at [facility].  Rx is currently in Pharmacist, community.  Representative states that I can call back tomorrow to check copay and that she will mark it as urgent.

## 2018-11-21 ENCOUNTER — Ambulatory Visit: Payer: BC Managed Care – PPO

## 2018-11-21 ENCOUNTER — Ambulatory Visit
Admission: RE | Admit: 2018-11-21 | Discharge: 2018-11-21 | Disposition: A | Discharge status: Home / Self Care | Payer: BC Managed Care – PPO | Source: Ambulatory Visit | Attending: Internal Medicine | Admitting: Internal Medicine

## 2018-11-21 ENCOUNTER — Other Ambulatory Visit: Payer: Self-pay

## 2018-11-21 DIAGNOSIS — C772 Secondary and unspecified malignant neoplasm of intra-abdominal lymph nodes: Secondary | ICD-10-CM | POA: Insufficient documentation

## 2018-11-21 DIAGNOSIS — C182 Malignant neoplasm of ascending colon: Secondary | ICD-10-CM | POA: Insufficient documentation

## 2018-11-21 MED ORDER — IOHEXOL 300 MG/ML  SOLN
75.0000 mL | Freq: Once | INTRAMUSCULAR | Status: AC | PRN
  Administered 2018-11-21: 75 mL via INTRAVENOUS

## 2018-11-21 NOTE — Telephone Encounter (Signed)
Called [facility] to check on insurance issue, and it has been resolved.  The pharmacy will be reaching out to the patient today to set up shipment of medication.

## 2018-11-25 ENCOUNTER — Inpatient Hospital Stay (HOSPITAL_BASED_OUTPATIENT_CLINIC_OR_DEPARTMENT_OTHER): Payer: BC Managed Care – PPO | Admitting: Internal Medicine

## 2018-11-25 ENCOUNTER — Encounter: Payer: Self-pay | Admitting: Internal Medicine

## 2018-11-25 ENCOUNTER — Other Ambulatory Visit: Payer: Self-pay

## 2018-11-25 ENCOUNTER — Inpatient Hospital Stay: Payer: BC Managed Care – PPO | Attending: Internal Medicine

## 2018-11-25 ENCOUNTER — Inpatient Hospital Stay: Payer: BC Managed Care – PPO

## 2018-11-25 VITALS — BP 124/78 | HR 82 | Temp 97.9°F | Resp 18 | Wt 89.2 lb

## 2018-11-25 DIAGNOSIS — E876 Hypokalemia: Secondary | ICD-10-CM

## 2018-11-25 DIAGNOSIS — C7951 Secondary malignant neoplasm of bone: Secondary | ICD-10-CM | POA: Insufficient documentation

## 2018-11-25 DIAGNOSIS — R490 Dysphonia: Secondary | ICD-10-CM

## 2018-11-25 DIAGNOSIS — C772 Secondary and unspecified malignant neoplasm of intra-abdominal lymph nodes: Secondary | ICD-10-CM

## 2018-11-25 DIAGNOSIS — R197 Diarrhea, unspecified: Secondary | ICD-10-CM | POA: Diagnosis not present

## 2018-11-25 DIAGNOSIS — G620 Drug-induced polyneuropathy: Secondary | ICD-10-CM | POA: Insufficient documentation

## 2018-11-25 DIAGNOSIS — R634 Abnormal weight loss: Secondary | ICD-10-CM | POA: Insufficient documentation

## 2018-11-25 DIAGNOSIS — C182 Malignant neoplasm of ascending colon: Secondary | ICD-10-CM

## 2018-11-25 DIAGNOSIS — Z87891 Personal history of nicotine dependence: Secondary | ICD-10-CM | POA: Insufficient documentation

## 2018-11-25 LAB — CBC WITH DIFFERENTIAL/PLATELET
Abs Immature Granulocytes: 0.02 10*3/uL (ref 0.00–0.07)
Basophils Absolute: 0 10*3/uL (ref 0.0–0.1)
Basophils Relative: 1 %
Eosinophils Absolute: 0.1 10*3/uL (ref 0.0–0.5)
Eosinophils Relative: 1 %
HCT: 35.4 % — ABNORMAL LOW (ref 36.0–46.0)
Hemoglobin: 11.4 g/dL — ABNORMAL LOW (ref 12.0–15.0)
Immature Granulocytes: 0 %
Lymphocytes Relative: 19 %
Lymphs Abs: 1.2 10*3/uL (ref 0.7–4.0)
MCH: 29.8 pg (ref 26.0–34.0)
MCHC: 32.2 g/dL (ref 30.0–36.0)
MCV: 92.7 fL (ref 80.0–100.0)
Monocytes Absolute: 0.5 10*3/uL (ref 0.1–1.0)
Monocytes Relative: 9 %
Neutro Abs: 4.4 10*3/uL (ref 1.7–7.7)
Neutrophils Relative %: 70 %
Platelets: 180 10*3/uL (ref 150–400)
RBC: 3.82 MIL/uL — ABNORMAL LOW (ref 3.87–5.11)
RDW: 16.1 % — ABNORMAL HIGH (ref 11.5–15.5)
WBC: 6.2 10*3/uL (ref 4.0–10.5)
nRBC: 0 % (ref 0.0–0.2)

## 2018-11-25 LAB — COMPREHENSIVE METABOLIC PANEL
ALT: 13 U/L (ref 0–44)
AST: 18 U/L (ref 15–41)
Albumin: 3.7 g/dL (ref 3.5–5.0)
Alkaline Phosphatase: 90 U/L (ref 38–126)
Anion gap: 11 (ref 5–15)
BUN: 11 mg/dL (ref 6–20)
CO2: 25 mmol/L (ref 22–32)
Calcium: 9.2 mg/dL (ref 8.9–10.3)
Chloride: 101 mmol/L (ref 98–111)
Creatinine, Ser: 0.84 mg/dL (ref 0.44–1.00)
GFR calc Af Amer: >60 mL/min (ref >60)
GFR calc non Af Amer: >60 mL/min (ref >60)
Glucose, Bld: 109 mg/dL — ABNORMAL HIGH (ref 70–99)
Potassium: 3.2 mmol/L — ABNORMAL LOW (ref 3.5–5.1)
Sodium: 137 mmol/L (ref 135–145)
Total Bilirubin: 0.4 mg/dL (ref 0.3–1.2)
Total Protein: 6.8 g/dL (ref 6.5–8.1)

## 2018-11-25 NOTE — Progress Notes (Signed)
Nutrition Follow-up:  Patient with stage IV colon cancer with metastatic disease to L2 and liver.  Patient followed by Dr. Jacinto Reap.   Spoke with patient following MD visit.  Patient reports that diarrhea is about the same as well as nausea.  Reports small frequent meals work better for her.  She is drinking a gatorade shake (??270 calories and 20 g protein online).  Patient could not remember amount of shake.      Medications: lomotil and imodium, lonsurf  Labs: reviewed  Anthropometrics:   Weight 89 lb decreased from 97 lb since 6/22 visit  106 lb 1 year ago   NUTRITION DIAGNOSIS: Inadequate oral intake continues   INTERVENTION:  Discussed ways to add more calories in diet.   Encouraged use of shake 2 times per day.  Provided additional shakes to try (ensure clear, Dillard [place], orgain vegan, boost soothe)    MONITORING, EVALUATION, GOAL: Patient will consume adequate calories and protein to maintain weight   NEXT VISIT: July 27  Evy Lutterman B. Zenia Resides, [place], [place] Registered Dietitian [phone] (pager)

## 2018-11-25 NOTE — Progress Notes (Signed)
[place] OFFICE PROGRESS NOTE  Patient Care Team: Patient, No Pcp Per as PCP - General (General Practice)  Cancer Staging No matching staging information was found for the patient.   Oncology History Overview Note  # [facility] 2017- COLON CANCER- STAGE IV [s/p right hemi-colectomy; pT4apN2 (7/19LN)]; Pre-op CEA- [id number]  PET-  mesenteric adenopathy/mediastinal adenopathy/right-sided subclavicular lymph node.    April 17th -START FOLFOX; May 1st Add Avastin;   Aug 16th- Disc OX [sec PN]; con 5FU-Avastin; NOV 27th CT C/A/P- CR; except for 10m LUL; 5FU-Avastin q 3 W [Poor tolerance to FOLFOX].  # June/July 2019- Progression of Liver/Abd LN; July 2019- START FOLFIRI + Avastin; STOP iri 02/25/2018- sec to diarrhea; cont Avastin + 5FU  # March 2018- L2 uptake MET s/p RT [[facility]2018]; mid April X-geva  # L1 ablation/ostecool-  Jan 3rd 2020-   # march 2nd 2020- decrease 5FU LET [place]5 CUV  to '2000mg'$ /m2/  #June 17th, 2020-liver ablation; 3.5 cm lesion.  Intolerance to 5-FU plus Avastin.  Discontinued; Side effects/weight loss/ quality of life  #July 13th -Lonsurf  # July 2nd CT scan-right laryngeal nerve palsy/hoarseness of voice; CT-chest NEG.   # MOLECULAR TESTING: K-ras-EXON 2- MUTATED; MSI- STABLE.   DIAGNOSIS: colon ca  STAGE:  IV ;GOALS: pallaitive  CURRENT/MOST RECENT THERAPY:Lonsurf    Cancer of ascending colon metastatic to intra-abdominal lymph node (HCC)      INTERVAL HISTORY:  MSHREEYA [id number].o.  female pleasant patient above history of metastatic adenocarcinoma of the colon currently on 5-FU-avastin is here for follow-up.  [facility] ablation done of the liver lesion last week.  Procedure was uneventful.  She admits to poor appetite.  Denies abdominal pain.  Has about 5 pounds weight loss.  Continues to have intermittent diarrhea 1-2 loose stools a day.  She continues to  have hoarseness of voice. No lumps or bumps in the neck.  Denies any nausea vomiting.  Denies abdominal pain.  Denies any significant weight loss.  Continues to chronic back pain.  No worsening tingling and numbness in extremities.   Review of Systems  Constitutional: Positive for malaise/fatigue and weight loss. Negative for chills, diaphoresis and fever.  HENT: Negative for nosebleeds and sore throat.   Eyes: Negative for double vision.  Respiratory: Negative for cough, hemoptysis, sputum production, shortness of breath and wheezing.   Cardiovascular: Negative for chest pain, palpitations, orthopnea and leg swelling.  Gastrointestinal: Negative for abdominal pain, constipation, heartburn, melena, nausea and vomiting.  Genitourinary: Negative for dysuria, frequency and urgency.  Musculoskeletal: Positive for back pain. Negative for joint pain.  Skin: Negative.  Negative for itching and rash.  Neurological: Positive for tingling. Negative for dizziness, focal weakness, weakness and headaches.  Endo/Heme/Allergies: Does not bruise/bleed easily.  Psychiatric/Behavioral: Negative for depression. The patient is not nervous/anxious and does not have insomnia.       PAST MEDICAL HISTORY :  Past Medical History:  Diagnosis Date  . Chicken pox   . Colon cancer ([place]    Partial colectomy 07/2015 + chemo tx's  . Hypertension   . Hypokalemia   . Menopause    > 5 yrs  . Peripheral neuropathy due to chemotherapy ([facility]    bi lat feet    PAST SURGICAL HISTORY :   Past Surgical History:  Procedure Laterality Date  . BREAST BIOPSY Right    2007? pt unsure done by [facility]  . COLONOSCOPY    .  COLONOSCOPY WITH PROPOFOL N/A 10/14/2015   Procedure: COLONOSCOPY WITH PROPOFOL;  Surgeon: Hulen Luster, MD;  [facility] ENDOSCOPY;  Service: Endoscopy;  Laterality: N/A;  . COLOSTOMY REVISION Right 08/09/2015   Procedure: COLON RESECTION RIGHT;  Surgeon: Florene Glen, MD;  Location: [facility] ORS;  Service:  General;  Laterality: Right;  . ECTOPIC PREGNANCY SURGERY    . IR BONE TUMOR(S)RF ABLATION  05/24/2018  . IR KYPHO LUMBAR INC FX REDUCE BONE BX UNI/BIL CANNULATION INC/IMAGING  05/24/2018  . IR RADIOLOGIST EVAL & MGMT  05/08/2018  . IR RADIOLOGIST EVAL & MGMT  06/18/2018  . IR RADIOLOGIST EVAL & MGMT  07/31/2018  . PORTACATH PLACEMENT N/A 09/01/2015   Procedure: INSERTION PORT-A-CATH;  Surgeon: Florene Glen, MD;  Location: [facility] ORS;  Service: General;  Laterality: N/A;  . RADIOLOGY WITH ANESTHESIA N/A 11/06/2018   Procedure: CT-MICROWAVE THERMAL ABLATION/LIVER;  Surgeon: Corrie Mckusick, DO;  Location: [facility] ORS;  Service: Anesthesiology;  Laterality: N/A;  . SHOULDER SURGERY Left     FAMILY HISTORY :   Family History  Problem Relation Age of Onset  . Hypertension Mother   . Arthritis Father   . Prostate cancer Father   . Diabetes Maternal Grandmother   . Colon cancer Maternal Grandfather        colon    SOCIAL HISTORY:   Social History   Tobacco Use  . Smoking status: Former Smoker    Packs/day: 0.25    Years: 2.00    Pack years: 0.50  . Smokeless tobacco: Never Used  . Tobacco comment: recently quit  Substance Use Topics  . Alcohol use: No    Alcohol/week: 0.0 standard drinks  . Drug use: No    ALLERGIES:  has No Known Allergies.  MEDICATIONS:  Current Outpatient Medications  Medication Sig Dispense Refill  . acetaminophen (TYLENOL) 325 MG tablet Take 2 tablets (650 mg total) by mouth every 6 (six) hours as needed for mild pain (or Fever >/= 101).    . benazepril (LOTENSIN) 20 MG tablet TAKE 1 TABLET DAILY (Patient taking differently: Take 20 mg by mouth at bedtime. ) 90 tablet 4  . Calcium Carbonate-Vitamin D (CALCIUM 600/VITAMIN D PO) Take 1 tablet by mouth every evening.     . diphenoxylate-atropine (LOMOTIL) 2.5-0.025 MG tablet Take 1 tablet by mouth 4 (four) times daily as needed for diarrhea or loose stools. Take it along with immodium 40 tablet 4  . DULoxetine  (CYMBALTA) 20 MG capsule Take 2 capsules (40 mg total) by mouth daily. (Patient taking differently: Take 20 mg by mouth 2 (two) times daily. ) 180 capsule 3  . feeding supplement (BOOST HIGH PROTEIN) LIQD Take 1 Container by mouth daily.     . fentaNYL (DURAGESIC) 25 MCG/HR Place 1 patch onto the skin every 3 (three) days. 10 patch 0  . ferrous sulfate (FERROUSUL) 325 (65 FE) MG tablet Take 1 tablet (325 mg total) by mouth daily with breakfast. 90 tablet 3  . gabapentin (NEURONTIN) 300 MG capsule One pill in am; and 2 pills at night prior to sleep. (Patient taking differently: Take 300-600 mg by mouth See admin instructions. Take 1 capsule (300 mg) by mouth daily in the morning & take 2 capsules (600 mg) by mouth at night.) 90 capsule 3  . hydrochlorothiazide (MICROZIDE) 12.5 MG capsule Take 1 capsule (12.5 mg total) by mouth daily. (Patient taking differently: Take 12.5 mg by mouth at bedtime. ) 30 capsule 6  . HYDROcodone-acetaminophen (NORCO/VICODIN)  5-325 MG tablet Take 1 tablet by mouth every 12 (twelve) hours as needed for moderate pain. (Patient taking differently: Take 1 tablet by mouth 2 (two) times daily as needed (pain.). ) 60 tablet 0  . lidocaine-prilocaine (EMLA) cream Apply 1 application topically as needed. Apply generously over the Mediport 45 minutes prior to chemotherapy. (Patient taking differently: Apply 1 application topically as needed (prior to chemotherapy.). ) 30 g 6  . loperamide (IMODIUM) 2 MG capsule Take 2 mg by mouth 4 (four) times daily as needed for diarrhea or loose stools.     . Multiple Vitamin (MULTIVITAMIN WITH MINERALS) TABS tablet Take 1 tablet by mouth daily.    . ondansetron (ZOFRAN) 8 MG tablet One pill every 8 hours as needed for nausea/vomitting. (Patient taking differently: Take 8 mg by mouth every 8 (eight) hours as needed for nausea or vomiting. ) 60 tablet 0  . potassium chloride SA (K-DUR,KLOR-CON) 20 MEQ tablet 1 pill twice a day (Patient taking  differently: Take 20 mEq by mouth 2 (two) times daily. ) 60 tablet 3  . prochlorperazine (COMPAZINE) 10 MG tablet Take 1 tablet (10 mg total) by mouth every 6 (six) hours as needed for nausea or vomiting. 90 tablet 3  . trifluridine-tipiracil (LONSURF) 20-8.19 MG tablet Take 2 tablets by mouth twice daily. Take 1 hr after AM & PM meals. Take on days 1-5, 8-12. Repeat every 28day 40 tablet 6   No current facility-administered medications for this visit.    Facility-Administered Medications Ordered in Other Visits  Medication Dose Route Frequency Provider Last Rate Last Dose  . heparin lock flush 100 unit/mL  500 Units Intravenous Once Charlaine Dalton R, MD      . sodium chloride flush (NS) 0.9 % injection 10 mL  10 mL Intravenous PRN Cammie Sickle, MD   10 mL at 10/04/15 0901  . sodium chloride flush (NS) 0.9 % injection 10 mL  10 mL Intravenous PRN Cammie Sickle, MD   10 mL at 07/02/17 1020    PHYSICAL EXAMINATION: ECOG PERFORMANCE STATUS: 1 - Symptomatic but completely ambulatory  BP 124/78 (BP Location: Left Arm, Patient Position: Sitting)   Pulse 82   Temp 97.9 F (36.6 C) (Tympanic)   Resp 18   Wt 89 lb 3.2 oz (40.5 kg)   LMP  (LMP Unknown) Comment: LMP MORE THAN 5 YRS  BMI 17.42 kg/m   Filed Weights   11/25/18 1504  Weight: 89 lb 3.2 oz (40.5 kg)    Physical Exam  Constitutional: She is oriented to person, place, and time and well-developed, well-nourished, and in no distress.  She is alone. She is walking herself.  HENT:  Head: Normocephalic and atraumatic.  Mouth/Throat: Oropharynx is clear and moist. No oropharyngeal exudate.  Eyes: Pupils are equal, round, and reactive to light.  Neck: Normal range of motion. Neck supple.  Cardiovascular: Normal rate and regular rhythm.  Pulmonary/Chest: No respiratory distress. She has no wheezes.  Abdominal: Soft. Bowel sounds are normal. She exhibits no distension and no mass. There is no abdominal tenderness.  There is no rebound and no guarding.  Musculoskeletal: Normal range of motion.        General: No tenderness or edema.  Neurological: She is alert and oriented to person, place, and time.  Skin: Skin is warm.  Psychiatric: Affect normal.       LABORATORY DATA:  I have reviewed the data as listed    Component Value Date/Time  NA 137 11/25/2018 1421   K 3.2 (L) 11/25/2018 1421   CL 101 11/25/2018 1421   CO2 25 11/25/2018 1421   GLUCOSE 109 (H) 11/25/2018 1421   BUN 11 11/25/2018 1421   CREATININE 0.84 11/25/2018 1421   CALCIUM 9.2 11/25/2018 1421   PROT 6.8 11/25/2018 1421   ALBUMIN 3.7 11/25/2018 1421   AST 18 11/25/2018 1421   ALT 13 11/25/2018 1421   ALKPHOS 90 11/25/2018 1421   BILITOT 0.4 11/25/2018 1421   GFRNONAA >60 11/25/2018 1421   GFRAA >60 11/25/2018 1421    No results found for: SPEP, UPEP  Lab Results  Component Value Date   WBC 6.2 11/25/2018   NEUTROABS 4.4 11/25/2018   HGB 11.4 (L) 11/25/2018   HCT 35.4 (L) 11/25/2018   MCV 92.7 11/25/2018   PLT 180 11/25/2018      Chemistry      Component Value Date/Time   NA 137 11/25/2018 1421   K 3.2 (L) 11/25/2018 1421   CL 101 11/25/2018 1421   CO2 25 11/25/2018 1421   BUN 11 11/25/2018 1421   CREATININE 0.84 11/25/2018 1421      Component Value Date/Time   CALCIUM 9.2 11/25/2018 1421   ALKPHOS 90 11/25/2018 1421   AST 18 11/25/2018 1421   ALT 13 11/25/2018 1421   BILITOT 0.4 11/25/2018 1421       RADIOGRAPHIC STUDIES: I have personally reviewed the radiological images as listed and agreed with the findings in the report. No results found.   ASSESSMENT & PLAN:  Cancer of ascending colon metastatic to intra-abdominal lymph node [facility]) # Colon cancer- cecal/ right-sided; STAGE IV; JUne 2020- MRI increasing lesion in liver ~3.5cm [see below].  CEA -25 slowly rising; at 25.  Status post ablation of the liver lesion.   # plan start Lonsurf starting June 13th; reviewed the schedule with the  patient.  Again reviewed the potential side effects including but not limited to diarrhea; mucositis; pain etc.   # Hoarseness of voice-July 2020 CT scan-concerning for laryngeal nerve palsy- refer to ENT.   # diarrhea- G-1-2; continue lomotil/ imodium-stable  #Weight loss-5 pounds; secondary chemotherapy.  Status post evaluation with nutrition.  # L2 metastases status post ablation [Jan third]-on fentanyl/hydrocodone as needed-stable  # Hypokalemia- 3.2; increase Kdur to 20 BID.    # Peripheral neuropathy -stable  # DISPOSITION:  # Refer to [facility] ASAP re: hoarseness of vioce/ laryngeal nerve pasly.  #  Follow up in  3 weeks; MD/ labs- cbc/cmp/cea; Dr.B  Cc; alyson-     Orders Placed This Encounter  Procedures  . Ambulatory referral to ENT    Referral Priority:   Routine    Referral Type:   Consultation    Referral Reason:   Specialty Services Required    Referred to Provider:   Carloyn Manner, MD    Requested Specialty:   Otolaryngology    Number of Visits Requested:   1   All questions were answered. The patient knows to call the clinic with any problems, questions or concerns.      Cammie Sickle, MD 11/25/2018 8:02 PM

## 2018-11-25 NOTE — Patient Instructions (Signed)
#   Start lonsurf 6/13 thru 6/17; and 6/20 thru 6/24

## 2018-11-25 NOTE — Progress Notes (Signed)
Patient is here for follow up, she is doing well no complaints.  

## 2018-11-25 NOTE — Assessment & Plan Note (Addendum)
#   Colon cancer- cecal/ right-sided; STAGE IV; JUne 2020- MRI increasing lesion in liver ~3.5cm [see below].  CEA -25 slowly rising; at 25.  Status post ablation of the liver lesion.   # plan start Lonsurf starting June 13th; reviewed the schedule with the patient.  Again reviewed the potential side effects including but not limited to diarrhea; mucositis; pain etc.   # Hoarseness of voice-July 2020 CT scan-concerning for laryngeal nerve palsy- refer to ENT.   # diarrhea- G-1-2; continue lomotil/ imodium-stable  #Weight loss-5 pounds; secondary chemotherapy.  Status post evaluation with nutrition.  # L2 metastases status post ablation [Jan third]-on fentanyl/hydrocodone as needed-stable  # Hypokalemia- 3.2; increase Kdur to 20 BID.    # Peripheral neuropathy -stable  # DISPOSITION:  # Refer to [facility] ASAP re: hoarseness of vioce/ laryngeal nerve pasly.  #  Follow up in  3 weeks; MD/ labs- cbc/cmp/cea; Dr.B  Cc; alyson-

## 2018-11-26 LAB — CEA: CEA: 17.9 ng/mL — ABNORMAL HIGH (ref 0.0–4.7)

## 2018-11-27 NOTE — Telephone Encounter (Signed)
Called [facility] to check the delivery status for medication.  Rep stated medication was delivered 11/26/2018.

## 2018-12-02 ENCOUNTER — Telehealth: Payer: Self-pay | Admitting: Internal Medicine

## 2018-12-02 DIAGNOSIS — R1310 Dysphagia, unspecified: Secondary | ICD-10-CM

## 2018-12-02 NOTE — Telephone Encounter (Signed)
MRI

## 2018-12-03 ENCOUNTER — Telehealth: Payer: Self-pay | Admitting: Gastroenterology

## 2018-12-03 ENCOUNTER — Ambulatory Visit: Admission: RE | Admit: 2018-12-03 | Payer: BC Managed Care – PPO | Source: Ambulatory Visit

## 2018-12-04 ENCOUNTER — Other Ambulatory Visit: Payer: Self-pay

## 2018-12-04 ENCOUNTER — Ambulatory Visit
Admission: RE | Admit: 2018-12-04 | Discharge: 2018-12-04 | Disposition: A | Discharge status: Home / Self Care | Payer: BC Managed Care – PPO | Source: Ambulatory Visit | Attending: Internal Medicine | Admitting: Internal Medicine

## 2018-12-04 DIAGNOSIS — R1310 Dysphagia, unspecified: Secondary | ICD-10-CM | POA: Diagnosis present

## 2018-12-04 MED ORDER — GADOBUTROL 1 MMOL/ML IV SOLN
4.0000 mL | Freq: Once | INTRAVENOUS | Status: AC | PRN
  Administered 2018-12-04: 4 mL via INTRAVENOUS

## 2018-12-12 ENCOUNTER — Other Ambulatory Visit: Payer: Medicare Other

## 2018-12-12 NOTE — Telephone Encounter (Signed)
Error

## 2018-12-16 ENCOUNTER — Inpatient Hospital Stay (HOSPITAL_BASED_OUTPATIENT_CLINIC_OR_DEPARTMENT_OTHER): Payer: BC Managed Care – PPO | Admitting: Internal Medicine

## 2018-12-16 ENCOUNTER — Other Ambulatory Visit: Payer: Self-pay

## 2018-12-16 ENCOUNTER — Inpatient Hospital Stay: Payer: BC Managed Care – PPO | Admitting: *Deleted

## 2018-12-16 DIAGNOSIS — C772 Secondary and unspecified malignant neoplasm of intra-abdominal lymph nodes: Secondary | ICD-10-CM | POA: Diagnosis not present

## 2018-12-16 DIAGNOSIS — C182 Malignant neoplasm of ascending colon: Secondary | ICD-10-CM

## 2018-12-16 DIAGNOSIS — I10 Essential (primary) hypertension: Secondary | ICD-10-CM | POA: Diagnosis not present

## 2018-12-16 DIAGNOSIS — C7951 Secondary malignant neoplasm of bone: Secondary | ICD-10-CM | POA: Diagnosis not present

## 2018-12-16 DIAGNOSIS — G620 Drug-induced polyneuropathy: Secondary | ICD-10-CM

## 2018-12-16 DIAGNOSIS — Z95828 Presence of other vascular implants and grafts: Secondary | ICD-10-CM

## 2018-12-16 LAB — CBC WITH DIFFERENTIAL/PLATELET
Abs Immature Granulocytes: 0.05 10*3/uL (ref 0.00–0.07)
Basophils Absolute: 0 10*3/uL (ref 0.0–0.1)
Basophils Relative: 0 %
Eosinophils Absolute: 0 10*3/uL (ref 0.0–0.5)
Eosinophils Relative: 1 %
HCT: 30.4 % — ABNORMAL LOW (ref 36.0–46.0)
Hemoglobin: 9.9 g/dL — ABNORMAL LOW (ref 12.0–15.0)
Immature Granulocytes: 1 %
Lymphocytes Relative: 18 %
Lymphs Abs: 0.7 10*3/uL (ref 0.7–4.0)
MCH: 29.6 pg (ref 26.0–34.0)
MCHC: 32.6 g/dL (ref 30.0–36.0)
MCV: 91 fL (ref 80.0–100.0)
Monocytes Absolute: 0.2 10*3/uL (ref 0.1–1.0)
Monocytes Relative: 4 %
Neutro Abs: 3.2 10*3/uL (ref 1.7–7.7)
Neutrophils Relative %: 76 %
Platelets: 136 10*3/uL — ABNORMAL LOW (ref 150–400)
RBC: 3.34 MIL/uL — ABNORMAL LOW (ref 3.87–5.11)
RDW: 15.9 % — ABNORMAL HIGH (ref 11.5–15.5)
WBC: 4.1 10*3/uL (ref 4.0–10.5)
nRBC: 0 % (ref 0.0–0.2)

## 2018-12-16 LAB — URINALYSIS, COMPLETE (UACMP) WITH MICROSCOPIC
Bacteria, UA: NONE SEEN
Bilirubin Urine: NEGATIVE
Glucose, UA: NEGATIVE mg/dL
Hgb urine dipstick: NEGATIVE
Ketones, ur: NEGATIVE mg/dL
Leukocytes,Ua: NEGATIVE
Nitrite: NEGATIVE
Protein, ur: 30 mg/dL — AB
Specific Gravity, Urine: 1.013 (ref 1.005–1.030)
pH: 5 (ref 5.0–8.0)

## 2018-12-16 LAB — COMPREHENSIVE METABOLIC PANEL
ALT: 9 U/L (ref 0–44)
AST: 14 U/L — ABNORMAL LOW (ref 15–41)
Albumin: 3.5 g/dL (ref 3.5–5.0)
Alkaline Phosphatase: 51 U/L (ref 38–126)
Anion gap: 8 (ref 5–15)
BUN: 12 mg/dL (ref 6–20)
CO2: 26 mmol/L (ref 22–32)
Calcium: 8.3 mg/dL — ABNORMAL LOW (ref 8.9–10.3)
Chloride: 101 mmol/L (ref 98–111)
Creatinine, Ser: 0.77 mg/dL (ref 0.44–1.00)
GFR calc Af Amer: >60 mL/min (ref >60)
GFR calc non Af Amer: >60 mL/min (ref >60)
Glucose, Bld: 88 mg/dL (ref 70–99)
Potassium: 3.3 mmol/L — ABNORMAL LOW (ref 3.5–5.1)
Sodium: 135 mmol/L (ref 135–145)
Total Bilirubin: 0.8 mg/dL (ref 0.3–1.2)
Total Protein: 6.3 g/dL — ABNORMAL LOW (ref 6.5–8.1)

## 2018-12-16 MED ORDER — DRONABINOL 5 MG PO CAPS: 5.0000 mg | ORAL_CAPSULE | Freq: Two times a day (BID) | ORAL | 4 refills | Status: DC

## 2018-12-16 MED ORDER — HEPARIN SOD (PORK) LOCK FLUSH 100 UNIT/ML IV SOLN
500.0000 [IU] | Freq: Once | INTRAVENOUS | Status: AC
  Administered 2018-12-16: 14:00:00 500 [IU] via INTRAVENOUS

## 2018-12-16 MED ORDER — SODIUM CHLORIDE 0.9% FLUSH
10.0000 mL | Freq: Once | INTRAVENOUS | Status: AC
  Administered 2018-12-16: 10 mL via INTRAVENOUS
  Filled 2018-12-16: qty 10

## 2018-12-16 NOTE — Progress Notes (Signed)
[place] OFFICE PROGRESS NOTE  Patient Care Team: Patient, No Pcp Per as PCP - General (General Practice)  Cancer Staging No matching staging information was found for the patient.   Oncology History Overview Note  # [facility] 2017- COLON CANCER- STAGE IV [s/p right hemi-colectomy; pT4apN2 (7/19LN)]; Pre-op CEA- [id number]  PET-  mesenteric adenopathy/mediastinal adenopathy/right-sided subclavicular lymph node.    April 17th -START FOLFOX; May 1st Add Avastin;   Aug 16th- Disc OX [sec PN]; con 5FU-Avastin; NOV 27th CT C/A/P- CR; except for 27m LUL; 5FU-Avastin q 3 W [Poor tolerance to FOLFOX].  # June/July 2019- Progression of Liver/Abd LN; July 2019- START FOLFIRI + Avastin; STOP iri 02/25/2018- sec to diarrhea; cont Avastin + 5FU  # March 2018- L2 uptake MET s/p RT [[facility]2018]; mid April X-geva  # L1 ablation/ostecool-  Jan 3rd 2020-   # march 2nd 2020- decrease 5FU LET [place]5 CUV  to 20039mm2/  #June 17th, 2020-liver ablation; 3.5 cm lesion.  Intolerance to 5-FU plus Avastin.  Discontinued; Side effects/weight loss/ quality of life  #July 13th -Lonsurf cycle #1  # July 2nd CT scan-right laryngeal nerve palsy/hoarseness of voice [Dr. McQueen] CT-chest NEG; MRI brain negative.   # MOLECULAR TESTING: K-ras-EXON 2- MUTATED; MSI- STABLE.   DIAGNOSIS: colon ca  STAGE:  IV ;GOALS: pallaitive  CURRENT/MOST RECENT THERAPY:Lonsurf    Cancer of ascending colon metastatic to intra-abdominal lymph node (HCC)      INTERVAL HISTORY:  Nancy WILMOTT89.o.  female pleasant patient above history of metastatic adenocarcinoma of the colon currently on  Lonsurf cycle #1 is here for follow-up.  Patient was recently evaluated by ENT for hoarseness of voice diagnosed with recurrent laryngeal nerve paralysis unclear etiology; CT chest negative for any acute process/MRI brain negative for any acute process.  Patient admits to poor appetite.  Positive for weight loss 9  pounds in the last 1 month.  Denies abdominal pain.  Diarrhea improved.    Review of Systems  Constitutional: Positive for malaise/fatigue and weight loss. Negative for chills, diaphoresis and fever.  HENT: Negative for nosebleeds and sore throat.   Eyes: Negative for double vision.  Respiratory: Negative for cough, hemoptysis, sputum production, shortness of breath and wheezing.   Cardiovascular: Negative for chest pain, palpitations, orthopnea and leg swelling.  Gastrointestinal: Negative for abdominal pain, constipation, heartburn, melena, nausea and vomiting.  Genitourinary: Negative for dysuria, frequency and urgency.  Musculoskeletal: Positive for back pain. Negative for joint pain.  Skin: Negative.  Negative for itching and rash.  Neurological: Positive for tingling. Negative for dizziness, focal weakness, weakness and headaches.  Endo/Heme/Allergies: Does not bruise/bleed easily.  Psychiatric/Behavioral: Negative for depression. The patient is not nervous/anxious and does not have insomnia.       PAST MEDICAL HISTORY :  Past Medical History:  Diagnosis Date  . Chicken pox   . [place]   Partial colectomy 07/2015 + chemo tx's  . Hypertension   . Hypokalemia   . Menopause    > 5 yrs  . Peripheral [place] due to [place] ([place]   bi lat feet    PAST SURGICAL HISTORY :   Past Surgical History:  Procedure Laterality Date  . BREAST BIOPSY Right    2007? pt unsure done by [facility]  . COLONOSCOPY    . COLONOSCOPY WITH PROPOFOL N/A 10/14/2015   Procedure: COLONOSCOPY WITH PROPOFOL;  Surgeon: PaHulen LusterMD;  [facility];  Service:  Endoscopy;  Laterality: N/A;  . COLOSTOMY REVISION Right 08/09/2015   Procedure: COLON RESECTION RIGHT;  Surgeon: Florene Glen, MD;  Location: [facility] ORS;  Service: General;  Laterality: Right;  . ECTOPIC PREGNANCY SURGERY    . IR BONE TUMOR(S)RF ABLATION  05/24/2018  . IR KYPHO LUMBAR INC FX REDUCE BONE BX UNI/BIL  CANNULATION INC/IMAGING  05/24/2018  . IR RADIOLOGIST EVAL & MGMT  05/08/2018  . IR RADIOLOGIST EVAL & MGMT  06/18/2018  . IR RADIOLOGIST EVAL & MGMT  07/31/2018  . PORTACATH PLACEMENT N/A 09/01/2015   Procedure: INSERTION PORT-A-CATH;  Surgeon: Florene Glen, MD;  Location: [facility] ORS;  Service: General;  Laterality: N/A;  . RADIOLOGY WITH ANESTHESIA N/A 11/06/2018   Procedure: CT-MICROWAVE THERMAL ABLATION/LIVER;  Surgeon: Corrie Mckusick, DO;  Location: [facility];  Service: Anesthesiology;  Laterality: N/A;  . SHOULDER SURGERY Left     FAMILY HISTORY :   Family History  Problem Relation Age of Onset  . Hypertension Mother   . Arthritis Father   . Prostate cancer Father   . Diabetes Maternal Grandmother   . Colon cancer Maternal Grandfather        colon    SOCIAL HISTORY:   Social History   Tobacco Use  . Smoking status: Former Smoker    Packs/day: 0.25    Years: 2.00    Pack years: 0.50  . Smokeless tobacco: Never Used  . Tobacco comment: recently quit  Substance Use Topics  . Alcohol use: No    Alcohol/week: 0.0 standard drinks  . Drug use: No    ALLERGIES:  has No Known Allergies.  MEDICATIONS:  Current Outpatient Medications  Medication Sig Dispense Refill  . acetaminophen (TYLENOL) 325 MG tablet Take 2 tablets (650 mg total) by mouth every 6 (six) hours as needed for mild pain (or Fever >/= 101).    . Calcium Carbonate-Vitamin D (CALCIUM 600/VITAMIN D PO) Take 1 tablet by mouth every evening.     . diphenoxylate-atropine (LOMOTIL) 2.5-0.025 MG tablet Take 1 tablet by mouth 4 (four) times daily as needed for diarrhea or loose stools. Take it along with immodium 40 tablet 4  . DULoxetine (CYMBALTA) 20 MG capsule Take 2 capsules (40 mg total) by mouth daily. (Patient taking differently: Take 20 mg by mouth 2 (two) times daily. ) 180 capsule 3  . feeding supplement (BOOST HIGH PROTEIN) LIQD Take 1 Container by mouth daily.     . fentaNYL (DURAGESIC) 25 MCG/HR Place 1 patch  onto the skin every 3 (three) days. 10 patch 0  . ferrous sulfate (FERROUSUL) 325 (65 FE) MG tablet Take 1 tablet (325 mg total) by mouth daily with breakfast. 90 tablet 3  . gabapentin (NEURONTIN) 300 MG capsule One pill in am; and 2 pills at night prior to sleep. (Patient taking differently: Take 300-600 mg by mouth See admin instructions. Take 1 capsule (300 mg) by mouth daily in the morning & take 2 capsules (600 mg) by mouth at night.) 90 capsule 3  . HYDROcodone-acetaminophen (NORCO/VICODIN) 5-325 MG tablet Take 1 tablet by mouth every 12 (twelve) hours as needed for moderate pain. (Patient taking differently: Take 1 tablet by mouth 2 (two) times daily as needed (pain.). ) 60 tablet 0  . lidocaine-prilocaine (EMLA) cream Apply 1 application topically as needed. Apply generously over the Mediport 45 minutes prior to chemotherapy. (Patient taking differently: Apply 1 application topically as needed (prior to chemotherapy.). ) 30 g 6  . loperamide (IMODIUM) 2  MG capsule Take 2 mg by mouth 4 (four) times daily as needed for diarrhea or loose stools.     . Multiple Vitamin (MULTIVITAMIN WITH MINERALS) TABS tablet Take 1 tablet by mouth daily.    . ondansetron (ZOFRAN) 8 MG tablet One pill every 8 hours as needed for nausea/vomitting. (Patient taking differently: Take 8 mg by mouth every 8 (eight) hours as needed for nausea or vomiting. ) 60 tablet 0  . potassium chloride SA (K-DUR,KLOR-CON) 20 MEQ tablet 1 pill twice a day (Patient taking differently: Take 20 mEq by mouth 2 (two) times daily. ) 60 tablet 3  . prochlorperazine (COMPAZINE) 10 MG tablet Take 1 tablet (10 mg total) by mouth every 6 (six) hours as needed for nausea or vomiting. 90 tablet 3  . trifluridine-tipiracil (LONSURF) 20-8.19 MG tablet Take 2 tablets by mouth twice daily. Take 1 hr after AM & PM meals. Take on days 1-5, 8-12. Repeat every 28day 40 tablet 6  . dronabinol (MARINOL) 5 MG capsule Take 1 capsule (5 mg total) by mouth 2  (two) times daily before lunch and supper. 60 capsule 4   No current facility-administered medications for this visit.    Facility-Administered Medications Ordered in Other Visits  Medication Dose Route Frequency Provider Last Rate Last Dose  . sodium chloride flush (NS) 0.9 % injection 10 mL  10 mL Intravenous PRN Cammie Sickle, MD   10 mL at 10/04/15 0901  . sodium chloride flush (NS) 0.9 % injection 10 mL  10 mL Intravenous PRN Cammie Sickle, MD   10 mL at 07/02/17 1020    PHYSICAL EXAMINATION: ECOG PERFORMANCE STATUS: 1 - Symptomatic but completely ambulatory  Temp 98.8 F (37.1 C)   Resp 16   Wt 88 lb (39.9 kg)   LMP  (LMP Unknown) Comment: LMP MORE THAN 5 YRS  BMI 17.19 kg/m   Filed Weights   12/16/18 1423  Weight: 88 lb (39.9 kg)    Physical Exam  Constitutional: She is oriented to person, place, and time and well-developed, well-nourished, and in no distress.  She is alone. She is walking herself.  HENT:  Head: Normocephalic and atraumatic.  Mouth/Throat: Oropharynx is clear and moist. No oropharyngeal exudate.  Eyes: Pupils are equal, round, and reactive to light.  Neck: Normal range of motion. Neck supple.  Cardiovascular: Normal rate and regular rhythm.  Pulmonary/Chest: No respiratory distress. She has no wheezes.  Abdominal: Soft. Bowel sounds are normal. She exhibits no distension and no mass. There is no abdominal tenderness. There is no rebound and no guarding.  Musculoskeletal: Normal range of motion.        General: No tenderness or edema.  Neurological: She is alert and oriented to person, place, and time.  Skin: Skin is warm.  Psychiatric: Affect normal.       LABORATORY DATA:  I have reviewed the data as listed    Component Value Date/Time   NA 135 12/16/2018 1403   K 3.3 (L) 12/16/2018 1403   CL 101 12/16/2018 1403   CO2 26 12/16/2018 1403   GLUCOSE 88 12/16/2018 1403   BUN 12 12/16/2018 1403   CREATININE 0.77 12/16/2018  1403   CALCIUM 8.3 (L) 12/16/2018 1403   PROT 6.3 (L) 12/16/2018 1403   ALBUMIN 3.5 12/16/2018 1403   AST 14 (L) 12/16/2018 1403   ALT 9 12/16/2018 1403   ALKPHOS 51 12/16/2018 1403   BILITOT 0.8 12/16/2018 1403   GFRNONAA >60 12/16/2018 1403  GFRAA >60 12/16/2018 1403    No results found for: SPEP, UPEP  Lab Results  Component Value Date   WBC 4.1 12/16/2018   NEUTROABS 3.2 12/16/2018   HGB 9.9 (L) 12/16/2018   HCT 30.4 (L) 12/16/2018   MCV 91.0 12/16/2018   PLT 136 (L) 12/16/2018      Chemistry      Component Value Date/Time   NA 135 12/16/2018 1403   K 3.3 (L) 12/16/2018 1403   CL 101 12/16/2018 1403   CO2 26 12/16/2018 1403   BUN 12 12/16/2018 1403   CREATININE 0.77 12/16/2018 1403      Component Value Date/Time   CALCIUM 8.3 (L) 12/16/2018 1403   ALKPHOS 51 12/16/2018 1403   AST 14 (L) 12/16/2018 1403   ALT 9 12/16/2018 1403   BILITOT 0.8 12/16/2018 1403       RADIOGRAPHIC STUDIES: I have personally reviewed the radiological images as listed and agreed with the findings in the report. No results found.   ASSESSMENT & PLAN:  Cancer of ascending colon metastatic to intra-abdominal lymph node [facility]) # Colon cancer- cecal/ right-sided; STAGE IV; JUne 2020- MRI increasing lesion in liver ~3.5cm [see below].  Status post ablation of the liver lesion. PSA trending at [place] likely secondary to recent ablation. Currently on Lonsurf cycle #1.    #Patient will start cycle #2 in approximately 2 weeks.  Today labs within normal limits.  # weight loss-10 pounds in the last 1 month-poor appetite s/p evaluation with Jolie; recommend addition of marinol pills.   # Hoarseness of voice- larynaglea nerve paralysis.  Unclear etiology/chest CT MRI brain negative.  Stable.  #Mild low blood pressures/dizzy-recommend discontinuation of hydrochlorothiazide/Lotensin as patient is currently not on Avastin.   # L2 metastases status post ablation [Jan third]-on  fentanyl/hydrocodone as needed-stable  # Hypokalemia- 3.3 continue ;Kdur to 20 BID.    # Peripheral neuropathy - stable.   # DISPOSITION:  #  Follow up in  2 weeks; MD/ labs- cbc/cmp/cea; Dr.B  Cc; alyson-     No orders of the defined types were placed in this encounter.  All questions were answered. The patient knows to call the clinic with any problems, questions or concerns.      Cammie Sickle, MD 12/16/2018 10:06 PM

## 2018-12-16 NOTE — Assessment & Plan Note (Addendum)
#   Colon cancer- cecal/ right-sided; STAGE IV; JUne 2020- MRI increasing lesion in liver ~3.5cm [see below].  Status post ablation of the liver lesion. PSA trending at [place] likely secondary to recent ablation. Currently on Lonsurf cycle #1.    #Patient will start cycle #2 in approximately 2 weeks.  Today labs within normal limits.  # weight loss-10 pounds in the last 1 month-poor appetite s/p evaluation with Jolie; recommend addition of marinol pills.   # Hoarseness of voice- larynaglea nerve paralysis.  Unclear etiology/chest CT MRI brain negative.  Stable.  #Mild low blood pressures/dizzy-recommend discontinuation of hydrochlorothiazide/Lotensin as patient is currently not on Avastin.   # L2 metastases status post ablation [Jan third]-on fentanyl/hydrocodone as needed-stable  # Hypokalemia- 3.3 continue ;Kdur to 20 BID.    # Peripheral neuropathy - stable.   # DISPOSITION:  #  Follow up in  2 weeks; MD/ labs- cbc/cmp/cea; Dr.B  Cc; alyson-

## 2018-12-16 NOTE — Progress Notes (Signed)
Patient has seen ENT Dr. Tami Ribas since last visit with Dr. Rogue Bussing.  She has started Lonsurf and was tired/sleepy for the first few days.  Also was having nausea that was relieved with medication.  BP was low at 96/64 and states that she does get dizzy with standing sometimes.  I retook bp after standing and bp was 90/60.  Does have trouble swallowing and ENT has referred her to GI.  Needing a refill on Fentanyl sent to [facility] in [place]

## 2018-12-16 NOTE — Patient Instructions (Signed)
#  Stop your blood pressure medications-hydrochlorothiazide/Lotensin

## 2018-12-19 ENCOUNTER — Other Ambulatory Visit: Payer: Self-pay

## 2018-12-19 ENCOUNTER — Other Ambulatory Visit: Payer: Self-pay | Admitting: Nurse Practitioner

## 2018-12-19 ENCOUNTER — Encounter: Payer: Self-pay | Admitting: *Deleted

## 2018-12-19 ENCOUNTER — Ambulatory Visit
Admission: RE | Admit: 2018-12-19 | Discharge: 2018-12-19 | Disposition: A | Discharge status: Home / Self Care | Payer: BC Managed Care – PPO | Source: Ambulatory Visit | Attending: Radiology | Admitting: Radiology

## 2018-12-19 ENCOUNTER — Other Ambulatory Visit: Payer: Self-pay | Admitting: *Deleted

## 2018-12-19 DIAGNOSIS — R160 Hepatomegaly, not elsewhere classified: Secondary | ICD-10-CM

## 2018-12-19 HISTORY — PX: IR RADIOLOGIST EVAL & MGMT: IMG5224

## 2018-12-19 MED ORDER — FENTANYL 25 MCG/HR TD PT72: 1.0000 | MEDICATED_PATCH | TRANSDERMAL | 0 refills | Status: DC

## 2018-12-19 MED ORDER — DRONABINOL 5 MG PO CAPS: 5.0000 mg | ORAL_CAPSULE | Freq: Two times a day (BID) | ORAL | 4 refills | Status: DC

## 2018-12-19 NOTE — Progress Notes (Signed)
Spoke to patient. She says that marinol was going to cost more than $200 if filled at [facility]. She requests prescription be sent to [facility] which will be fully covered.

## 2018-12-19 NOTE — Progress Notes (Signed)
Chief Complaint: Metastatic Colon Cancer  Referring Physician(s): Dr. Rogue Bussing  History of Present Illness: Nancy Clark is a 58 y.o. female presenting today as a scheduled follow up to [facility], SP CT guided tissue ablation of right hepatic lobe colon CA met.   She joins [place] today by telemedicine visit given the COVID situation.    She is a woman with a history of colon carcinoma diagnosed February of 2017, treated by Dr. Rogue Bussing.  We treated oligometastatic disease to the liver on November 06 2018 with microwave ablation of a right hepatic lobe lesion.    She was discharged after 1 night in the hospital, June 18th, and tells me that she did fine.  She experienced some soreness of the RUQ for a few days afterwards, but that has resolved.  She has been able to return to her usual daily activities without problem.    Currently she tells me she has 2-3 episodes of nausea every week. She takes PO medication prn for nausea, which helps.   Otherwise she has no complains, with no pain, GI symptoms, GU symptoms, and denies any fever, rigors, chills, or new pulmonary symptoms.    She continues treatment with Dr. Rogue Bussing.   Past Medical History:  Diagnosis Date  . Chicken pox   . Colon cancer ([place])    Partial colectomy 07/2015 + chemo tx's  . Hypertension   . Hypokalemia   . Menopause    > 5 yrs  . Peripheral neuropathy due to chemotherapy ([place])    bi lat feet    Past Surgical History:  Procedure Laterality Date  . BREAST BIOPSY Right    2007? pt unsure done by [facility]  . COLONOSCOPY    . COLONOSCOPY WITH PROPOFOL N/A 10/14/2015   Procedure: COLONOSCOPY WITH PROPOFOL;  Surgeon: Hulen Luster, MD;  Location: [facility] ENDOSCOPY;  Service: Endoscopy;  Laterality: N/A;  . COLOSTOMY REVISION Right 08/09/2015   Procedure: COLON RESECTION RIGHT;  Surgeon: Florene Glen, MD;  Location: [facility] ORS;  Service: General;  Laterality: Right;  . ECTOPIC PREGNANCY SURGERY    . IR BONE TUMOR(S)RF  ABLATION  05/24/2018  . IR KYPHO LUMBAR INC FX REDUCE BONE BX UNI/BIL CANNULATION INC/IMAGING  05/24/2018  . IR RADIOLOGIST EVAL & MGMT  05/08/2018  . IR RADIOLOGIST EVAL & MGMT  06/18/2018  . IR RADIOLOGIST EVAL & MGMT  07/31/2018  . IR RADIOLOGIST EVAL & MGMT  12/19/2018  . PORTACATH PLACEMENT N/A 09/01/2015   Procedure: INSERTION PORT-A-CATH;  Surgeon: Florene Glen, MD;  Location: [facility] ORS;  Service: General;  Laterality: N/A;  . RADIOLOGY WITH ANESTHESIA N/A 11/06/2018   Procedure: CT-MICROWAVE THERMAL ABLATION/LIVER;  Surgeon: Corrie Mckusick, DO;  Location: [facility] ORS;  Service: Anesthesiology;  Laterality: N/A;  . SHOULDER SURGERY Left     Allergies: Patient has no known allergies.  Medications: Prior to Admission medications   Medication Sig Start Date End Date Taking? Authorizing Provider  acetaminophen (TYLENOL) 325 MG tablet Take 2 tablets (650 mg total) by mouth every 6 (six) hours as needed for mild pain (or Fever >/= 101). 10/14/15   Nicholes Mango, MD  Calcium Carbonate-Vitamin D (CALCIUM 600/VITAMIN D PO) Take 1 tablet by mouth every evening.     [provider]  diphenoxylate-atropine (LOMOTIL) 2.5-0.025 MG tablet Take 1 tablet by mouth 4 (four) times daily as needed for diarrhea or loose stools. Take it along with immodium 09/09/18   Cammie Sickle, MD  dronabinol (  MARINOL) 5 MG capsule Take 1 capsule (5 mg total) by mouth 2 (two) times daily before lunch and supper. 12/16/18   Cammie Sickle, MD  DULoxetine (CYMBALTA) 20 MG capsule Take 2 capsules (40 mg total) by mouth daily. Patient taking differently: Take 20 mg by mouth 2 (two) times daily.  06/10/18   Cammie Sickle, MD  feeding supplement (BOOST HIGH PROTEIN) LIQD Take 1 Container by mouth daily.     [provider]  fentaNYL (DURAGESIC) 25 MCG/HR Place 1 patch onto the skin every 3 (three) days. 11/11/18   Cammie Sickle, MD  ferrous sulfate (FERROUSUL) 325 (65 FE) MG tablet Take 1  tablet (325 mg total) by mouth daily with breakfast. 10/14/15   Gouru, Illene Silver, MD  gabapentin (NEURONTIN) 300 MG capsule One pill in am; and 2 pills at night prior to sleep. Patient taking differently: Take 300-600 mg by mouth See admin instructions. Take 1 capsule (300 mg) by mouth daily in the morning & take 2 capsules (600 mg) by mouth at night. 09/09/18   Cammie Sickle, MD  HYDROcodone-acetaminophen (NORCO/VICODIN) 5-325 MG tablet Take 1 tablet by mouth every 12 (twelve) hours as needed for moderate pain. Patient taking differently: Take 1 tablet by mouth 2 (two) times daily as needed (pain.).  09/09/18   Cammie Sickle, MD  lidocaine-prilocaine (EMLA) cream Apply 1 application topically as needed. Apply generously over the Mediport 45 minutes prior to chemotherapy. Patient taking differently: Apply 1 application topically as needed (prior to chemotherapy.).  05/27/18   Cammie Sickle, MD  loperamide (IMODIUM) 2 MG capsule Take 2 mg by mouth 4 (four) times daily as needed for diarrhea or loose stools.     [provider]  Multiple Vitamin (MULTIVITAMIN WITH MINERALS) TABS tablet Take 1 tablet by mouth daily.    [provider]  ondansetron (ZOFRAN) 8 MG tablet One pill every 8 hours as needed for nausea/vomitting. Patient taking differently: Take 8 mg by mouth every 8 (eight) hours as needed for nausea or vomiting.  09/09/18   Cammie Sickle, MD  potassium chloride SA (K-DUR,KLOR-CON) 20 MEQ tablet 1 pill twice a day Patient taking differently: Take 20 mEq by mouth 2 (two) times daily.  12/17/17   Cammie Sickle, MD  prochlorperazine (COMPAZINE) 10 MG tablet Take 1 tablet (10 mg total) by mouth every 6 (six) hours as needed for nausea or vomiting. 05/27/18   Cammie Sickle, MD  trifluridine-tipiracil (LONSURF) 20-8.19 MG tablet Take 2 tablets by mouth twice daily. Take 1 hr after AM & PM meals. Take on days 1-5, 8-12. Repeat every 28day 11/14/18    Cammie Sickle, MD     Family History  Problem Relation Age of Onset  . Hypertension Mother   . Arthritis Father   . Prostate cancer Father   . Diabetes Maternal Grandmother   . Colon cancer Maternal Grandfather        colon    Social History   Socioeconomic History  . Marital status: Married    Spouse name: Not on file  . Number of children: Not on file  . Years of education: Not on file  . Highest education level: Not on file  Occupational History  . Not on file  Social Needs  . Financial resource strain: Not on file  . Food insecurity    Worry: Not on file    Inability: Not on file  . Transportation needs  Medical: Not on file    Non-medical: Not on file  Tobacco Use  . Smoking status: Former Smoker    Packs/day: 0.25    Years: 2.00    Pack years: 0.50  . Smokeless tobacco: Never Used  . Tobacco comment: recently quit  Substance and Sexual Activity  . Alcohol use: No    Alcohol/week: 0.0 standard drinks  . Drug use: No  . Sexual activity: Yes  Lifestyle  . Physical activity    Days per week: Not on file    Minutes per session: Not on file  . Stress: Not on file  Relationships  . Social Herbalist on phone: Not on file    Gets together: Not on file    Attends religious service: Not on file    Active member of club or organization: Not on file    Attends meetings of clubs or organizations: Not on file    Relationship status: Not on file  Other Topics Concern  . Not on file  Social History Narrative  . Not on file    ECOG Status: 1 - Symptomatic but completely ambulatory  Review of Systems  Review of Systems: A 12 point ROS discussed and pertinent positives are indicated in the HPI above.  All other systems are negative.  Physical Exam No direct physical exam was performed (except for noted visual exam findings with Video Visits).    Vital Signs: LMP  (LMP Unknown) Comment: LMP MORE THAN 5 YRS  Imaging: Ct Soft Tissue Neck  W Contrast  Result Date: 11/22/2018 CLINICAL DATA:  Patient being treated for metastatic colon cancer. Hoarseness. EXAM: CT NECK WITH CONTRAST TECHNIQUE: Multidetector CT imaging of the neck was performed using the standard protocol following the bolus administration of intravenous contrast. CONTRAST:  13m OMNIPAQUE IOHEXOL 300 MG/ML  SOLN COMPARISON:  CT chest same day FINDINGS: Pharynx and larynx: No abnormality seen in the supraglottic region or above. The glottis is abnormal with the right cord being at the midline. This could either be due to recurrent laryngeal nerve dysfunction or a laryngeal mass on the right. Direct inspection would be suggested. Salivary glands: Parotid and submandibular glands are normal. Thyroid: Normal Lymph nodes: No enlarged or low-density nodes on either side of the neck. No abnormality seen along the course the recurrent laryngeal nerve on the right to explain dysfunction. Vascular: Normal Limited intracranial: Normal Visualized orbits: Normal Mastoids and visualized paranasal sinuses: Clear Skeleton: Ordinary mid cervical spondylosis. Upper chest: See results of chest CT. Other: None IMPRESSION: The glottis is abnormal. 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 The differential diagnosis is right laryngeal mass, but I think that is less likely. None the less, direct inspection would be suggested. No cause of recurrent laryngeal nerve dysfunction is identified. No evidence of any lymphadenopathy. Electronically Signed   By: MNelson ChimesM.D.   On: 11/22/2018 07:19   Ct Chest W Contrast  Result Date: 11/21/2018 CLINICAL DATA:  Hoarseness.  Metastatic colon cancer. EXAM: CT CHEST WITH CONTRAST TECHNIQUE: Multidetector CT imaging of the chest was performed during intravenous contrast administration. CONTRAST:  735mOMNIPAQUE IOHEXOL 300 MG/ML  SOLN COMPARISON:  11/02/2017 CT.  Plain film 11/04/2018. FINDINGS: Cardiovascular:  Left Port-A-Cath tip high SVC. Aortic atherosclerosis. Normal heart size, without pericardial effusion. No central pulmonary embolism, on this non-dedicated study. Mediastinum/Nodes:  No mediastinal or hilar adenopathy. Lungs/Pleura: No pleural fluid. Left upper lobe pulmonary  nodules x2, both on the order of 2-3 mm are similar and can be presumed benign. Upper Abdomen: Interval ablation of previously described right hepatic lobe metastasis. The hypoattenuating area currently measures 3.1 x 3.0 cm versus 3.1 x 2.5 cm on the prior. A more inferior and peripheral wedge-shaped area of hypoenhancement is likely treatment related. This area is suboptimally evaluated secondary to bolus timing and the early postoperative time course. Other smaller hyperenhancing and hypoattenuating liver lesions are similar to on the prior. Normal imaged portions of the spleen, stomach, pancreas, adrenal glands, kidneys. Musculoskeletal: Lower cervical spondylosis. IMPRESSION: 1. No evidence of metastatic disease in the chest and no explanation for hoarseness. 2. Early post ablation appearance of the liver as detailed above. Electronically Signed   By: Abigail Miyamoto M.D.   On: 11/21/2018 17:06   Mr Jeri Cos TI Contrast  Result Date: 12/05/2018 CLINICAL DATA:  Dysphagia, unexplained.  Left focal cord paralysis EXAM: MRI HEAD WITHOUT AND WITH CONTRAST TECHNIQUE: Multiplanar, multiecho pulse sequences of the brain and surrounding structures were obtained without and with intravenous contrast. CONTRAST:  4 cc Gadavist intravenous COMPARISON:  Head CT 10/04/2004. FINDINGS: Brain: Mild FLAIR hyperintensity in the cerebral white matter and pons likely from microvascular ischemia given history of hypertension. Brain volume is normal and there is no infarct, mass, hydrocephalus, or collection. No explanation for history of vocal cord paresis. No mass is seen at the skull base or jugular foramen. Vascular: Major vascular enhancements are  preserved. Skull and upper cervical spine: Negative for marrow lesion Sinuses/Orbits: Negative IMPRESSION: Negative exam. No explanation for vocal cord paresis. No evidence of metastatic disease. Electronically Signed   By: Monte Fantasia M.D.   On: 12/05/2018 10:52   Ir Radiologist Eval & Mgmt  Result Date: 12/19/2018 Please refer to notes tab for details about interventional procedure. (Op Note)   Labs:  CBC: Recent Labs    11/04/18 0946 11/11/18 0930 11/25/18 1421 12/16/18 1403  WBC 6.0 10.7* 6.2 4.1  HGB 12.7 11.2* 11.4* 9.9*  HCT 40.5 34.7* 35.4* 30.4*  PLT 161 116* 180 136*    COAGS: Recent Labs    05/24/18 0955 11/04/18 0946  INR 1.11 1.0    BMP: Recent Labs    11/04/18 0946 11/11/18 0930 11/25/18 1421 12/16/18 1403  NA 137 133* 137 135  K 3.8 3.2* 3.2* 3.3*  CL 102 96* 101 101  CO2 23 25 25 26   GLUCOSE 84 125* 109* 88  BUN 12 11 11 12   CALCIUM 8.7* 8.7* 9.2 8.3*  CREATININE 0.86 0.92 0.84 0.77  GFRNONAA >60 >60 >60 >60  GFRAA >60 >60 >60 >60    LIVER FUNCTION TESTS: Recent Labs    11/04/18 0946 11/11/18 0930 11/25/18 1421 12/16/18 1403  BILITOT 0.4 0.6 0.4 0.8  AST 14* 28 18 14*  ALT 10 70* 13 9  ALKPHOS 51 55 90 51  PROT 7.1 7.1 6.8 6.3*  ALBUMIN 4.0 3.7 3.7 3.5    TUMOR MARKERS: No results for input(s): AFPTM, CEA, CA199, CHROMGRNA in the last 8760 hours.  Assessment and Plan:  Nancy Clark is a 58 yo female with metastatic colon cancer, receiving multi-disciplinary care, and most recently SP microwave ablation of oligometastatic liver disease, with single lesion treated on the right June 17th.    She has recovered completely after her treatment, and has no new complaint.   I discussed with her our typical surveillance interval, which is usually 3-4 months before any scheduled imaging.  She understands.   Plan: - Continue current care. - Follow up with Dr. Earleen Newport in 3-4 months with repeat CT abdomen/pelvis with contrast.     Electronically Signed: Corrie Mckusick 12/19/2018, 1:33 PM   I spent a total of    25 Minutes in remote  clinical consultation, greater than 50% of which was counseling/coordinating care for metastatic colon carcinoma, SP microwave ablation of single right liver lesion.    Visit type: Audio and video (Webex).   Alternative for in-person consultation at [facility], [address] [place], [place], [place]. This visit type was conducted due to national recommendations for restrictions regarding the COVID-19 Pandemic (e.g. social distancing).  This format is felt to be most appropriate for this patient at this time.  All issues noted in this document were discussed and addressed.

## 2018-12-30 ENCOUNTER — Other Ambulatory Visit: Payer: Self-pay

## 2018-12-30 ENCOUNTER — Inpatient Hospital Stay: Payer: BC Managed Care – PPO | Attending: Internal Medicine

## 2018-12-30 ENCOUNTER — Inpatient Hospital Stay (HOSPITAL_BASED_OUTPATIENT_CLINIC_OR_DEPARTMENT_OTHER): Payer: BC Managed Care – PPO | Admitting: Internal Medicine

## 2018-12-30 ENCOUNTER — Encounter: Payer: Self-pay | Admitting: Internal Medicine

## 2018-12-30 ENCOUNTER — Inpatient Hospital Stay: Payer: BC Managed Care – PPO

## 2018-12-30 DIAGNOSIS — K769 Liver disease, unspecified: Secondary | ICD-10-CM | POA: Diagnosis not present

## 2018-12-30 DIAGNOSIS — C182 Malignant neoplasm of ascending colon: Secondary | ICD-10-CM | POA: Diagnosis not present

## 2018-12-30 DIAGNOSIS — C180 Malignant neoplasm of cecum: Secondary | ICD-10-CM | POA: Insufficient documentation

## 2018-12-30 DIAGNOSIS — Z79899 Other long term (current) drug therapy: Secondary | ICD-10-CM | POA: Diagnosis not present

## 2018-12-30 DIAGNOSIS — C772 Secondary and unspecified malignant neoplasm of intra-abdominal lymph nodes: Secondary | ICD-10-CM | POA: Insufficient documentation

## 2018-12-30 DIAGNOSIS — C7951 Secondary malignant neoplasm of bone: Secondary | ICD-10-CM | POA: Insufficient documentation

## 2018-12-30 DIAGNOSIS — I10 Essential (primary) hypertension: Secondary | ICD-10-CM | POA: Insufficient documentation

## 2018-12-30 DIAGNOSIS — G629 Polyneuropathy, unspecified: Secondary | ICD-10-CM | POA: Insufficient documentation

## 2018-12-30 DIAGNOSIS — E876 Hypokalemia: Secondary | ICD-10-CM | POA: Insufficient documentation

## 2018-12-30 LAB — COMPREHENSIVE METABOLIC PANEL
ALT: 10 U/L (ref 0–44)
AST: 14 U/L — ABNORMAL LOW (ref 15–41)
Albumin: 3 g/dL — ABNORMAL LOW (ref 3.5–5.0)
Alkaline Phosphatase: 60 U/L (ref 38–126)
Anion gap: 7 (ref 5–15)
BUN: 7 mg/dL (ref 6–20)
CO2: 26 mmol/L (ref 22–32)
Calcium: 8.2 mg/dL — ABNORMAL LOW (ref 8.9–10.3)
Chloride: 109 mmol/L (ref 98–111)
Creatinine, Ser: 0.64 mg/dL (ref 0.44–1.00)
GFR calc Af Amer: >60 mL/min (ref >60)
GFR calc non Af Amer: >60 mL/min (ref >60)
Glucose, Bld: 85 mg/dL (ref 70–99)
Potassium: 3.3 mmol/L — ABNORMAL LOW (ref 3.5–5.1)
Sodium: 142 mmol/L (ref 135–145)
Total Bilirubin: 0.3 mg/dL (ref 0.3–1.2)
Total Protein: 5.7 g/dL — ABNORMAL LOW (ref 6.5–8.1)

## 2018-12-30 LAB — URINALYSIS, COMPLETE (UACMP) WITH MICROSCOPIC
Bacteria, UA: NONE SEEN
Bilirubin Urine: NEGATIVE
Glucose, UA: NEGATIVE mg/dL
Hgb urine dipstick: NEGATIVE
Ketones, ur: NEGATIVE mg/dL
Leukocytes,Ua: NEGATIVE
Nitrite: NEGATIVE
Protein, ur: NEGATIVE mg/dL
Specific Gravity, Urine: 1.025 (ref 1.005–1.030)
pH: 5 (ref 5.0–8.0)

## 2018-12-30 LAB — CBC WITH DIFFERENTIAL/PLATELET
Abs Immature Granulocytes: 0.01 10*3/uL (ref 0.00–0.07)
Basophils Absolute: 0 10*3/uL (ref 0.0–0.1)
Basophils Relative: 0 %
Eosinophils Absolute: 0 10*3/uL (ref 0.0–0.5)
Eosinophils Relative: 1 %
HCT: 33.3 % — ABNORMAL LOW (ref 36.0–46.0)
Hemoglobin: 10.4 g/dL — ABNORMAL LOW (ref 12.0–15.0)
Immature Granulocytes: 0 %
Lymphocytes Relative: 36 %
Lymphs Abs: 0.9 10*3/uL (ref 0.7–4.0)
MCH: 30.2 pg (ref 26.0–34.0)
MCHC: 31.2 g/dL (ref 30.0–36.0)
MCV: 96.8 fL (ref 80.0–100.0)
Monocytes Absolute: 0.3 10*3/uL (ref 0.1–1.0)
Monocytes Relative: 14 %
Neutro Abs: 1.2 10*3/uL — ABNORMAL LOW (ref 1.7–7.7)
Neutrophils Relative %: 49 %
Platelets: 202 10*3/uL (ref 150–400)
RBC: 3.44 MIL/uL — ABNORMAL LOW (ref 3.87–5.11)
RDW: 19.5 % — ABNORMAL HIGH (ref 11.5–15.5)
WBC: 2.5 10*3/uL — ABNORMAL LOW (ref 4.0–10.5)
nRBC: 0 % (ref 0.0–0.2)

## 2018-12-30 MED ORDER — POTASSIUM CHLORIDE CRYS ER 20 MEQ PO TBCR: 20.0000 meq | EXTENDED_RELEASE_TABLET | Freq: Two times a day (BID) | ORAL | 6 refills | Status: DC

## 2018-12-30 MED ORDER — GABAPENTIN 300 MG PO CAPS: 300.0000 mg | ORAL_CAPSULE | ORAL | 6 refills | Status: AC

## 2018-12-30 MED ORDER — HYDROCODONE-ACETAMINOPHEN 5-325 MG PO TABS: 1.0000 | ORAL_TABLET | Freq: Two times a day (BID) | ORAL | 0 refills | Status: DC | PRN

## 2018-12-30 NOTE — Progress Notes (Signed)
Nutrition  RD unable to speak with patient following MD appointment today.    Called and left message on voicemail with call back number.  Inice Sanluis B. Zenia Resides, [place], [place] Registered Dietitian [phone] (pager)

## 2018-12-30 NOTE — Assessment & Plan Note (Signed)
#   Colon cancer- cecal/ right-sided; STAGE IV; JUne 2020- MRI increasing lesion in liver ~3.5cm [see below].  Status post ablation of the liver lesion. PSA trending at [place] likely secondary to recent ablation. Currently on Lonsurf cycle #1.    #Patient will start cycle #2 today;   Today labs within normal limits except ANC 1.2.   # weight loss-gaining weight today 92 pounds.  Continue Marinol.  Tolerating well except for mild nausea.  Recommend taking Marinol with food.  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   # L2 metastases status post ablation [Jan third]-on fentanyl/hydrocodone as needed- stable  # Hypokalemia- 3.3 continue ;Kdur to 20 BID.    # Peripheral neuropathy - stable.    # DISPOSITION:  #  Follow up in  2 weeks; MD/ labs- cbc/cmp/cea; Dr.B  Cc; alyson-

## 2018-12-30 NOTE — Progress Notes (Signed)
Nancy Clark  Patient Care Team: Patient, No Pcp Per as PCP - General (General Practice)  Cancer Staging No matching staging information was found for the patient.   Oncology History Overview Clark  # [facility] 2017- COLON CANCER- STAGE IV [s/p right hemi-colectomy; pT4apN2 (7/19LN)]; Pre-op CEA- [id number]  PET-  mesenteric adenopathy/mediastinal adenopathy/right-sided subclavicular lymph node.    April 17th -START FOLFOX; May 1st Add Avastin;   Aug 16th- Disc OX [sec PN]; con 5FU-Avastin; NOV 27th CT C/A/P- CR; except for 19m LUL; 5FU-Avastin q 3 W [Poor tolerance to FOLFOX].  # June/July 2019- Progression of Liver/Abd LN; July 2019- START FOLFIRI + Avastin; STOP iri 02/25/2018- sec to diarrhea; cont Avastin + 5FU  # March 2018- L2 uptake MET s/p RT [[facility]2018]; mid April X-geva  # L1 ablation/ostecool-  Jan 3rd 2020-   # march 2nd 2020- decrease 5FU LET UK[place]5 CUV  to '2000mg'$ /m2/  #June 17th, 2020-liver ablation; 3.5 cm lesion.  Intolerance to 5-FU plus Avastin.  Discontinued; Side effects/weight loss/ quality of life  #July 13th -Lonsurf cycle #1  # July 2nd CT scan-right laryngeal nerve palsy/hoarseness of voice [Dr. McQueen] CT-chest NEG; MRI brain negative.   # MOLECULAR TESTING: K-ras-EXON 2- MUTATED; MSI- STABLE.   DIAGNOSIS: colon ca  STAGE:  IV ;GOALS: pallaitive  CURRENT/MOST RECENT THERAPY:Lonsurf    Cancer of ascending colon metastatic to intra-abdominal lymph node (HCC)    INTERVAL HISTORY:  Nancy KNOEBEL57y.o.  female pleasant patient above history of metastatic adenocarcinoma of the colon currently on  Lonsurf cycle #1 is here for follow-up.  Patient started on Marinol at last visit; she states to have improved appetite.  However notes to have nausea after taking the pills.  She has gained 4-5 pounds since the last visit.  She is a 92 pounds.  No vomiting.  No abdominal pain.  Diarrhea resolved.  Review of Systems   Constitutional: Positive for malaise/fatigue and weight loss. Negative for chills, diaphoresis and fever.  HENT: Negative for nosebleeds and sore throat.   Eyes: Negative for double vision.  Respiratory: Negative for cough, hemoptysis, sputum production, shortness of breath and wheezing.   Cardiovascular: Negative for chest pain, palpitations, orthopnea and leg swelling.  Gastrointestinal: Positive for nausea. Negative for abdominal pain, constipation, heartburn, melena and vomiting.  Genitourinary: Negative for dysuria, frequency and urgency.  Musculoskeletal: Positive for back pain. Negative for joint pain.  Skin: Negative.  Negative for itching and rash.  Neurological: Positive for tingling. Negative for dizziness, focal weakness, weakness and headaches.  Endo/Heme/Allergies: Does not bruise/bleed easily.  Psychiatric/Behavioral: Negative for depression. The patient is not nervous/anxious and does not have insomnia.       PAST MEDICAL HISTORY :  Past Medical History:  Diagnosis Date  . Chicken pox   . [place]    Partial colectomy 07/2015 + chemo tx's  . Hypertension   . Hypokalemia   . Menopause    > 5 yrs  . Peripheral [place] due to [place]    bi lat feet    PAST SURGICAL HISTORY :   Past Surgical History:  Procedure Laterality Date  . BREAST BIOPSY Right    2007? pt unsure done by [facility]  . COLONOSCOPY    . COLONOSCOPY WITH PROPOFOL N/A 10/14/2015   Procedure: COLONOSCOPY WITH PROPOFOL;  Surgeon: PHulen Luster MD;  [facility];  Service: Endoscopy;  Laterality: N/A;  . COLOSTOMY REVISION Right  08/09/2015   Procedure: COLON RESECTION RIGHT;  Surgeon: Florene Glen, MD;  Location: [facility] ORS;  Service: General;  Laterality: Right;  . ECTOPIC PREGNANCY SURGERY    . IR BONE TUMOR(S)RF ABLATION  05/24/2018  . IR KYPHO LUMBAR INC FX REDUCE BONE BX UNI/BIL CANNULATION INC/IMAGING  05/24/2018  . IR RADIOLOGIST EVAL & MGMT  05/08/2018  . IR  RADIOLOGIST EVAL & MGMT  06/18/2018  . IR RADIOLOGIST EVAL & MGMT  07/31/2018  . IR RADIOLOGIST EVAL & MGMT  12/19/2018  . PORTACATH PLACEMENT N/A 09/01/2015   Procedure: INSERTION PORT-A-CATH;  Surgeon: Florene Glen, MD;  Location: [facility] ORS;  Service: General;  Laterality: N/A;  . RADIOLOGY WITH ANESTHESIA N/A 11/06/2018   Procedure: CT-MICROWAVE THERMAL ABLATION/LIVER;  Surgeon: Corrie Mckusick, DO;  Location: [facility];  Service: Anesthesiology;  Laterality: N/A;  . SHOULDER SURGERY Left     FAMILY HISTORY :   Family History  Problem Relation Age of Onset  . Hypertension Mother   . Arthritis Father   . Prostate cancer Father   . Diabetes Maternal Grandmother   . Colon cancer Maternal Grandfather        colon    SOCIAL HISTORY:   Social History   Tobacco Use  . Smoking status: Former Smoker    Packs/day: 0.25    Years: 2.00    Pack years: 0.50  . Smokeless tobacco: Never Used  . Tobacco comment: recently quit  Substance Use Topics  . Alcohol use: No    Alcohol/week: 0.0 standard drinks  . Drug use: No    ALLERGIES:  has No Known Allergies.  MEDICATIONS:  Current Outpatient Medications  Medication Sig Dispense Refill  . acetaminophen (TYLENOL) 325 MG tablet Take 2 tablets (650 mg total) by mouth every 6 (six) hours as needed for mild pain (or Fever >/= 101).    . Calcium Carbonate-Vitamin D (CALCIUM 600/VITAMIN D PO) Take 1 tablet by mouth every evening.     . dronabinol (MARINOL) 5 MG capsule Take 1 capsule (5 mg total) by mouth 2 (two) times daily before lunch and supper. 60 capsule 4  . DULoxetine (CYMBALTA) 20 MG capsule Take 2 capsules (40 mg total) by mouth daily. (Patient taking differently: Take 20 mg by mouth 2 (two) times daily. ) 180 capsule 3  . feeding supplement (BOOST HIGH PROTEIN) LIQD Take 1 Container by mouth daily.     . fentaNYL (DURAGESIC) 25 MCG/HR Place 1 patch onto the skin every 3 (three) days. 10 patch 0  . ferrous sulfate (FERROUSUL) 325 (65 FE)  MG tablet Take 1 tablet (325 mg total) by mouth daily with breakfast. 90 tablet 3  . lidocaine-prilocaine (EMLA) cream Apply 1 application topically as needed. Apply generously over the Mediport 45 minutes prior to chemotherapy. (Patient taking differently: Apply 1 application topically as needed (prior to chemotherapy.). ) 30 g 6  . loperamide (IMODIUM) 2 MG capsule Take 2 mg by mouth 4 (four) times daily as needed for diarrhea or loose stools.     . Multiple Vitamin (MULTIVITAMIN WITH MINERALS) TABS tablet Take 1 tablet by mouth daily.    . ondansetron (ZOFRAN) 8 MG tablet One pill every 8 hours as needed for nausea/vomitting. (Patient taking differently: Take 8 mg by mouth every 8 (eight) hours as needed for nausea or vomiting. ) 60 tablet 0  . prochlorperazine (COMPAZINE) 10 MG tablet Take 1 tablet (10 mg total) by mouth every 6 (six) hours as needed for nausea  or vomiting. 90 tablet 3  . trifluridine-tipiracil (LONSURF) 20-8.19 MG tablet Take 2 tablets by mouth twice daily. Take 1 hr after AM & PM meals. Take on days 1-5, 8-12. Repeat every 28day 40 tablet 6  . diphenoxylate-atropine (LOMOTIL) 2.5-0.025 MG tablet Take 1 tablet by mouth 4 (four) times daily as needed for diarrhea or loose stools. Take it along with immodium (Patient not taking: Reported on 12/30/2018) 40 tablet 4  . gabapentin (NEURONTIN) 300 MG capsule Take 1-2 capsules (300-600 mg total) by mouth See admin instructions. Take 1 capsule (300 mg) by mouth daily in the morning & take 2 capsules (600 mg) by mouth at night. 90 capsule 6  . HYDROcodone-acetaminophen (NORCO/VICODIN) 5-325 MG tablet Take 1 tablet by mouth 2 (two) times daily as needed (pain.). 60 tablet 0  . potassium chloride SA (K-DUR) 20 MEQ tablet Take 1 tablet (20 mEq total) by mouth 2 (two) times daily. 60 tablet 6   No current facility-administered medications for this visit.    Facility-Administered Medications Ordered in Other Visits  Medication Dose Route  Frequency Provider Last Rate Last Dose  . sodium chloride flush (NS) 0.9 % injection 10 mL  10 mL Intravenous PRN Cammie Sickle, MD   10 mL at 10/04/15 0901  . sodium chloride flush (NS) 0.9 % injection 10 mL  10 mL Intravenous PRN Cammie Sickle, MD   10 mL at 07/02/17 1020    PHYSICAL EXAMINATION: ECOG PERFORMANCE STATUS: 1 - Symptomatic but completely ambulatory  BP 139/72   Pulse 66   Temp 97.7 F (36.5 C) (Tympanic)   Resp 18   Ht 5' (1.524 m)   Wt 92 lb 12.8 oz (42.1 kg)   LMP  (LMP Unknown) Comment: LMP MORE THAN 5 YRS  BMI 18.12 kg/m   Filed Weights   12/30/18 1428  Weight: 92 lb 12.8 oz (42.1 kg)    Physical Exam  Constitutional: She is oriented to person, place, and time and well-developed, well-nourished, and in no distress.  She is alone. She is walking herself.  HENT:  Head: Normocephalic and atraumatic.  Mouth/Throat: Oropharynx is clear and moist. No oropharyngeal exudate.  Eyes: Pupils are equal, round, and reactive to light.  Neck: Normal range of motion. Neck supple.  Cardiovascular: Normal rate and regular rhythm.  Pulmonary/Chest: No respiratory distress. She has no wheezes.  Abdominal: Soft. Bowel sounds are normal. She exhibits no distension and no mass. There is no abdominal tenderness. There is no rebound and no guarding.  Musculoskeletal: Normal range of motion.        General: No tenderness or edema.  Neurological: She is alert and oriented to person, place, and time.  Skin: Skin is warm.  Psychiatric: Affect normal.       LABORATORY DATA:  I have reviewed the data as listed    Component Value Date/Time   NA 142 12/30/2018 1408   K 3.3 (L) 12/30/2018 1408   CL 109 12/30/2018 1408   CO2 26 12/30/2018 1408   GLUCOSE 85 12/30/2018 1408   BUN 7 12/30/2018 1408   CREATININE 0.64 12/30/2018 1408   CALCIUM 8.2 (L) 12/30/2018 1408   PROT 5.7 (L) 12/30/2018 1408   ALBUMIN 3.0 (L) 12/30/2018 1408   AST 14 (L) 12/30/2018 1408    ALT 10 12/30/2018 1408   ALKPHOS 60 12/30/2018 1408   BILITOT 0.3 12/30/2018 1408   GFRNONAA >60 12/30/2018 1408   GFRAA >60 12/30/2018 1408    No  results found for: SPEP, UPEP  Lab Results  Component Value Date   WBC 2.5 (L) 12/30/2018   NEUTROABS 1.2 (L) 12/30/2018   HGB 10.4 (L) 12/30/2018   HCT 33.3 (L) 12/30/2018   MCV 96.8 12/30/2018   PLT 202 12/30/2018      Chemistry      Component Value Date/Time   NA 142 12/30/2018 1408   K 3.3 (L) 12/30/2018 1408   CL 109 12/30/2018 1408   CO2 26 12/30/2018 1408   BUN 7 12/30/2018 1408   CREATININE 0.64 12/30/2018 1408      Component Value Date/Time   CALCIUM 8.2 (L) 12/30/2018 1408   ALKPHOS 60 12/30/2018 1408   AST 14 (L) 12/30/2018 1408   ALT 10 12/30/2018 1408   BILITOT 0.3 12/30/2018 1408       RADIOGRAPHIC STUDIES: I have personally reviewed the radiological images as listed and agreed with the findings in the report. No results found.   ASSESSMENT & PLAN:  Cancer of ascending colon metastatic to intra-abdominal lymph node [facility]) # Colon cancer- cecal/ right-sided; STAGE IV; JUne 2020- MRI increasing lesion in liver ~3.5cm [see below].  Status post ablation of the liver lesion. PSA trending at [place] likely secondary to recent ablation. Currently on Lonsurf cycle #1.    #Patient will start cycle #2 today;   Today labs within normal limits except ANC 1.2.   # weight loss-gaining weight today 92 pounds.  Continue Marinol.  Tolerating well except for mild nausea.  Recommend taking Marinol with food.  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   # L2 metastases status post ablation [Jan third]-on fentanyl/hydrocodone as needed- stable  # Hypokalemia- 3.3 continue ;Kdur to 20 BID.    # Peripheral neuropathy - stable.    # DISPOSITION:  #  Follow up in  2 weeks; MD/ labs- cbc/cmp/cea; Dr.B  Cc; alyson-     Orders Placed This Encounter  Procedures  . CBC with Differential     Standing Status:   Future    Standing Expiration Date:   12/30/2019  . Comprehensive metabolic panel    Standing Status:   Future    Standing Expiration Date:   12/30/2019  . CEA    Standing Status:   Future    Standing Expiration Date:   12/30/2019   All questions were answered. The patient knows to call the clinic with any problems, questions or concerns.      Cammie Sickle, MD 12/30/2018 2:58 PM

## 2018-12-31 LAB — CEA: CEA: 16.4 ng/mL — ABNORMAL HIGH (ref 0.0–4.7)

## 2019-01-02 ENCOUNTER — Telehealth: Payer: Self-pay

## 2019-01-02 ENCOUNTER — Other Ambulatory Visit: Payer: Self-pay

## 2019-01-02 ENCOUNTER — Encounter: Payer: Self-pay | Admitting: Gastroenterology

## 2019-01-02 ENCOUNTER — Ambulatory Visit (INDEPENDENT_AMBULATORY_CARE_PROVIDER_SITE_OTHER): Payer: BC Managed Care – PPO | Admitting: Gastroenterology

## 2019-01-02 VITALS — BP 158/78 | HR 73 | Temp 97.5°F | Ht 60.0 in | Wt 93.0 lb

## 2019-01-02 DIAGNOSIS — R1310 Dysphagia, unspecified: Secondary | ICD-10-CM

## 2019-01-02 NOTE — Progress Notes (Signed)
Nancy Clark [address]  [place]  [place], [place] [address]  Main: [phone]  Fax: [phone]   Gastroenterology Consultation  Referring Provider:     Beverly Gust, MD Primary Care Physician:  Patient, No Pcp Per Reason for Consultation:     Dysphagia        HPI:    Chief Complaint  Patient presents with   New Patient (Initial Visit)    Patient has trouble every day when she is eating and taking his medication. Patient states he has nausea     [name] is a 58 y.o. y/o female referred for consultation & management  by Dr. Patient, No Pcp Per.  Patient seen by ENT for dysphagia and dysphonia and diagnosed with left vocal cord paralysis as etiology of her dysphasia.  Underwent MRI brain which did not show any lesions to attribute to the left vocal cord paralysis.  Patient reports dysphagia to pills and certain solid foods.  No dysphagia to liquids.  started 1 to 2 months ago.  Has not improved or worsened.  Also has History of stage IV cecal/right-sided colon cancer, on chemotherapy. Seeing Dr. Jacinto Reap of oncology. S/p ablation of liver lesion.   CT chest and CT soft tissue neck, reported laryngeal nerve dysfunction, with no mass.  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 pt unsure done by [facility]   COLONOSCOPY     COLONOSCOPY WITH PROPOFOL N/A 10/14/2015   Procedure: COLONOSCOPY WITH PROPOFOL;  Surgeon: Hulen Luster, MD;  Location: [facility]COPY;  Service: Endoscopy;  Laterality: N/A;   COLOSTOMY REVISION Right 08/09/2015   Procedure: COLON RESECTION RIGHT;  Surgeon: Florene Glen, MD;  Location: [facility] ORS;  Service: General;  Laterality: Right;   ECTOPIC  PREGNANCY SURGERY     IR BONE TUMOR(S)RF ABLATION  05/24/2018   IR KYPHO LUMBAR INC FX REDUCE BONE BX UNI/BIL CANNULATION INC/IMAGING  05/24/2018   IR RADIOLOGIST EVAL & MGMT  05/08/2018   IR RADIOLOGIST EVAL & MGMT  06/18/2018   IR RADIOLOGIST EVAL & MGMT  07/31/2018   IR RADIOLOGIST EVAL & MGMT  12/19/2018   PORTACATH PLACEMENT N/A 09/01/2015   Procedure: INSERTION PORT-A-CATH;  Surgeon: Florene Glen, MD;  Location: [facility] ORS;  Service: General;  Laterality: N/A;   RADIOLOGY WITH ANESTHESIA N/A 11/06/2018   Procedure: CT-MICROWAVE THERMAL ABLATION/LIVER;  Surgeon: Corrie Mckusick, DO;  Location: [facility];  Service: Anesthesiology;  Laterality: N/A;   SHOULDER SURGERY Left     Prior to Admission medications   Medication Sig Start Date End Date Taking? Authorizing Provider  acetaminophen (TYLENOL) 325 MG tablet Take 2 tablets (650 mg total) by mouth every 6 (six) hours as needed for mild pain (or Fever >/= 101). 10/14/15  Yes Gouru, Illene Silver, MD  Calcium Carbonate-Vitamin D (CALCIUM 600/VITAMIN D PO) Take 1 tablet by mouth every evening.    Yes [provider]  diphenoxylate-atropine (LOMOTIL) 2.5-0.025 MG tablet Take 1 tablet by mouth 4 (four) times daily as needed for diarrhea or loose stools. Take it along with immodium 09/09/18  Yes Brahmanday,  Elisha Headland, MD  dronabinol (MARINOL) 5 MG capsule Take 1 capsule (5 mg total) by mouth 2 (two) times daily before lunch and supper. 12/19/18  Yes Verlon Au, NP  DULoxetine (CYMBALTA) 20 MG capsule Take 2 capsules (40 mg total) by mouth daily. Patient taking differently: Take 20 mg by mouth 2 (two) times daily.  06/10/18  Yes Cammie Sickle, MD  feeding supplement (BOOST HIGH PROTEIN) LIQD Take 1 Container by mouth daily.    Yes [provider]  fentaNYL (DURAGESIC) 25 MCG/HR Place 1 patch onto the skin every 3 (three) days. 12/19/18  Yes Verlon Au, NP  ferrous sulfate (FERROUSUL) 325 (65 FE) MG tablet Take 1 tablet (325  mg total) by mouth daily with breakfast. 10/14/15  Yes Gouru, Aruna, MD  gabapentin (NEURONTIN) 300 MG capsule Take 1-2 capsules (300-600 mg total) by mouth See admin instructions. Take 1 capsule (300 mg) by mouth daily in the morning & take 2 capsules (600 mg) by mouth at night. 12/30/18  Yes Cammie Sickle, MD  HYDROcodone-acetaminophen (NORCO/VICODIN) 5-325 MG tablet Take 1 tablet by mouth 2 (two) times daily as needed (pain.). 12/30/18  Yes Cammie Sickle, MD  lidocaine-prilocaine (EMLA) cream Apply 1 application topically as needed. Apply generously over the Mediport 45 minutes prior to chemotherapy. Patient taking differently: Apply 1 application topically as needed (prior to chemotherapy.).  05/27/18  Yes Cammie Sickle, MD  loperamide (IMODIUM) 2 MG capsule Take 2 mg by mouth 4 (four) times daily as needed for diarrhea or loose stools.    Yes [provider]  Multiple Vitamin (MULTIVITAMIN WITH MINERALS) TABS tablet Take 1 tablet by mouth daily.   Yes [provider]  ondansetron (ZOFRAN) 8 MG tablet One pill every 8 hours as needed for nausea/vomitting. Patient taking differently: Take 8 mg by mouth every 8 (eight) hours as needed for nausea or vomiting.  09/09/18  Yes Cammie Sickle, MD  potassium chloride SA (K-DUR) 20 MEQ tablet Take 1 tablet (20 mEq total) by mouth 2 (two) times daily. 12/30/18  Yes Cammie Sickle, MD  prochlorperazine (COMPAZINE) 10 MG tablet Take 1 tablet (10 mg total) by mouth every 6 (six) hours as needed for nausea or vomiting. 05/27/18  Yes Cammie Sickle, MD  trifluridine-tipiracil (LONSURF) 20-8.19 MG tablet Take 2 tablets by mouth twice daily. Take 1 hr after AM & PM meals. Take on days 1-5, 8-12. Repeat every 28day 11/14/18  Yes Cammie Sickle, MD    Family History  Problem Relation Age of Onset   Hypertension Mother    Arthritis Father    Prostate cancer Father    Diabetes Maternal Grandmother      Colon cancer Maternal Grandfather        colon     Social History   Tobacco Use   Smoking status: Former Smoker    Packs/day: 0.25    Years: 2.00    Pack years: 0.50   Smokeless tobacco: Never Used   Tobacco comment: recently quit  Substance Use Topics   Alcohol use: No    Alcohol/week: 0.0 standard drinks   Drug use: No    Allergies as of 01/02/2019   (No Known Allergies)    Review of Systems:    All systems reviewed and negative except where noted in HPI.   Physical Exam:  BP (!) 158/78 (BP Location: Left Arm, Patient Position: Sitting)    Pulse 73    Temp Marland Kitchen)  97.5 F (36.4 C)    Ht 5' (1.524 m)    Wt 93 lb (42.2 kg)    LMP  (LMP Unknown) Comment: LMP MORE THAN 5 YRS   BMI 18.16 kg/m  No LMP recorded (lmp unknown). Patient is postmenopausal. Psych:  Alert and cooperative. Normal mood and affect. General:   Alert,  Well-developed, well-nourished, pleasant and cooperative in NAD Head:  Normocephalic and atraumatic. Eyes:  Sclera clear, no icterus.   Conjunctiva pink. Ears:  Normal auditory acuity. Nose:  No deformity, discharge, or lesions. Mouth:  No deformity or lesions,oropharynx pink & moist. Neck:  Supple; no masses or thyromegaly. Abdomen:  Normal bowel sounds.  No bruits.  Soft, non-tender and non-distended without masses, hepatosplenomegaly or hernias noted.  No guarding or rebound tenderness.    Msk:  Symmetrical without gross deformities. Good, equal movement & strength bilaterally. Pulses:  Normal pulses noted. Extremities:  No clubbing or edema.  No cyanosis. Neurologic:  Alert and oriented x3;  grossly normal neurologically. Skin:  Intact without significant lesions or rashes. No jaundice. Lymph Nodes:  No significant cervical adenopathy. Psych:  Alert and cooperative. Normal mood and affect.   Labs: CBC    Component Value Date/Time   WBC 2.5 (L) 12/30/2018 1408   RBC 3.44 (L) 12/30/2018 1408   HGB 10.4 (L) 12/30/2018 1408   HCT 33.3 (L)  12/30/2018 1408   PLT 202 12/30/2018 1408   MCV 96.8 12/30/2018 1408   MCH 30.2 12/30/2018 1408   MCHC 31.2 12/30/2018 1408   RDW 19.5 (H) 12/30/2018 1408   LYMPHSABS 0.9 12/30/2018 1408   MONOABS 0.3 12/30/2018 1408   EOSABS 0.0 12/30/2018 1408   BASOSABS 0.0 12/30/2018 1408   CMP     Component Value Date/Time   NA 142 12/30/2018 1408   K 3.3 (L) 12/30/2018 1408   CL 109 12/30/2018 1408   CO2 26 12/30/2018 1408   GLUCOSE 85 12/30/2018 1408   BUN 7 12/30/2018 1408   CREATININE 0.64 12/30/2018 1408   CALCIUM 8.2 (L) 12/30/2018 1408   PROT 5.7 (L) 12/30/2018 1408   ALBUMIN 3.0 (L) 12/30/2018 1408   AST 14 (L) 12/30/2018 1408   ALT 10 12/30/2018 1408   ALKPHOS 60 12/30/2018 1408   BILITOT 0.3 12/30/2018 1408   GFRNONAA >60 12/30/2018 1408   GFRAA >60 12/30/2018 1408    Imaging Studies: Mr Brain W Wo Contrast  Result Date: 12/05/2018 CLINICAL DATA:  Dysphagia, unexplained.  Left focal cord paralysis EXAM: MRI HEAD WITHOUT AND WITH CONTRAST TECHNIQUE: Multiplanar, multiecho pulse sequences of the brain and surrounding structures were obtained without and with intravenous contrast. CONTRAST:  4 cc Gadavist intravenous COMPARISON:  Head CT 10/04/2004. FINDINGS: Brain: Mild FLAIR hyperintensity in the cerebral white matter and pons likely from microvascular ischemia given history of hypertension. Brain volume is normal and there is no infarct, mass, hydrocephalus, or collection. No explanation for history of vocal cord paresis. No mass is seen at the skull base or jugular foramen. Vascular: Major vascular enhancements are preserved. Skull and upper cervical spine: Negative for marrow lesion Sinuses/Orbits: Negative IMPRESSION: Negative exam. No explanation for vocal cord paresis. No evidence of metastatic disease. Electronically Signed   By: Monte Fantasia M.D.   On: 12/05/2018 10:52   Ir Radiologist Eval & Mgmt  Result Date: 12/19/2018 Please refer to notes tab for details about  interventional procedure. (Op Note)   Assessment and Plan:   SABRINIA PRIEN is a 58 y.o.  y/o female has been referred for dysphagia by ENT, with left vocal cord paralysis on laryngoscopy and CT soft tissue neck, with history of stage IV colon cancer on chemotherapy  Etiology of patient's dysphagia is her vocal cord paralysis Recent CT scans have not shown any esophageal lesions  EGD in the setting of vocal cord paralysis and he has risks with sedation.  I discussed this with anesthesiology, Dr. Andree Elk, who stated that procedure can be done, but it would be higher risk of intubation than in a normal setting.  Given that her CT chest and CT soft tissue neck has not shown any esophageal lesions, an upper endoscopy would have higher risks given her more critical cord paralysis, than benefit of a diagnosis when we already have imaging studies.  However, a safer alternative would be an upper GI study which would help confirm or rule out any large esophageal or gastric lesions.  We discussed this with the patient and will order and schedule.  Spent over 50% of the time in coordinating patient care, discussing with other providers such as anesthesiology, reviewing previous chart and records.  Dr Nancy Clark  Speech recognition software was used to dictate the above note.

## 2019-01-02 NOTE — Telephone Encounter (Signed)
Nutrition  Patient returned RD's call and left message for RD.  RD tried to call patient back and no answer.  Left message with call back information.  Mathieu Schloemer B. Zenia Resides, [place], [place] Registered Dietitian [phone] (pager)

## 2019-01-10 ENCOUNTER — Other Ambulatory Visit: Payer: Self-pay

## 2019-01-13 ENCOUNTER — Other Ambulatory Visit: Payer: Self-pay

## 2019-01-13 ENCOUNTER — Encounter: Payer: Self-pay | Admitting: Internal Medicine

## 2019-01-13 ENCOUNTER — Inpatient Hospital Stay (HOSPITAL_BASED_OUTPATIENT_CLINIC_OR_DEPARTMENT_OTHER): Payer: BC Managed Care – PPO | Admitting: Internal Medicine

## 2019-01-13 ENCOUNTER — Inpatient Hospital Stay: Payer: BC Managed Care – PPO

## 2019-01-13 DIAGNOSIS — C182 Malignant neoplasm of ascending colon: Secondary | ICD-10-CM

## 2019-01-13 DIAGNOSIS — C180 Malignant neoplasm of cecum: Secondary | ICD-10-CM | POA: Diagnosis not present

## 2019-01-13 DIAGNOSIS — C772 Secondary and unspecified malignant neoplasm of intra-abdominal lymph nodes: Secondary | ICD-10-CM

## 2019-01-13 LAB — CBC WITH DIFFERENTIAL/PLATELET
Abs Immature Granulocytes: 0 10*3/uL (ref 0.00–0.07)
Band Neutrophils: 3 %
Basophils Absolute: 0 10*3/uL (ref 0.0–0.1)
Basophils Relative: 1 %
Eosinophils Absolute: 0 10*3/uL (ref 0.0–0.5)
Eosinophils Relative: 1 %
HCT: 28.3 % — ABNORMAL LOW (ref 36.0–46.0)
Hemoglobin: 9 g/dL — ABNORMAL LOW (ref 12.0–15.0)
Lymphocytes Relative: 31 %
Lymphs Abs: 0.7 10*3/uL (ref 0.7–4.0)
MCH: 29.9 pg (ref 26.0–34.0)
MCHC: 31.8 g/dL (ref 30.0–36.0)
MCV: 94 fL (ref 80.0–100.0)
Monocytes Absolute: 0.2 10*3/uL (ref 0.1–1.0)
Monocytes Relative: 7 %
Neutro Abs: 1.4 10*3/uL — ABNORMAL LOW (ref 1.7–7.7)
Neutrophils Relative %: 57 %
Platelets: 68 10*3/uL — ABNORMAL LOW (ref 150–400)
RBC: 3.01 MIL/uL — ABNORMAL LOW (ref 3.87–5.11)
RDW: 18.8 % — ABNORMAL HIGH (ref 11.5–15.5)
Smear Review: DECREASED
WBC: 2.3 10*3/uL — ABNORMAL LOW (ref 4.0–10.5)
nRBC: 0 % (ref 0.0–0.2)

## 2019-01-13 LAB — COMPREHENSIVE METABOLIC PANEL
ALT: 7 U/L (ref 0–44)
AST: 12 U/L — ABNORMAL LOW (ref 15–41)
Albumin: 3.1 g/dL — ABNORMAL LOW (ref 3.5–5.0)
Alkaline Phosphatase: 53 U/L (ref 38–126)
Anion gap: 7 (ref 5–15)
BUN: <5 mg/dL — ABNORMAL LOW (ref 6–20)
CO2: 27 mmol/L (ref 22–32)
Calcium: 7.5 mg/dL — ABNORMAL LOW (ref 8.9–10.3)
Chloride: 106 mmol/L (ref 98–111)
Creatinine, Ser: 0.59 mg/dL (ref 0.44–1.00)
GFR calc Af Amer: >60 mL/min (ref >60)
GFR calc non Af Amer: >60 mL/min (ref >60)
Glucose, Bld: 91 mg/dL (ref 70–99)
Potassium: 3.3 mmol/L — ABNORMAL LOW (ref 3.5–5.1)
Sodium: 140 mmol/L (ref 135–145)
Total Bilirubin: 0.4 mg/dL (ref 0.3–1.2)
Total Protein: 5.9 g/dL — ABNORMAL LOW (ref 6.5–8.1)

## 2019-01-13 MED ORDER — FENTANYL 25 MCG/HR TD PT72: 1.0000 | MEDICATED_PATCH | TRANSDERMAL | 0 refills | Status: DC

## 2019-01-13 NOTE — Assessment & Plan Note (Addendum)
#   Colon cancer- cecal/ right-sided; STAGE IV; JUne 2020- MRI increasing lesion in liver ~3.5cm [see below].  Status post ablation of the liver lesion. PSA trending at [place] likely secondary to recent ablation. Currently on Lonsurf.  # s/p cycle #2 finished 3 days ago. WBC 2.3; Hb 9; platelets- 68 from Lonsurf- monitor for now.   # weight loss-gaining weight today 92 pounds. STABLE.   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   # L2 metastases status post ablation [Jan third]-on fentanyl/hydrocodone as needed-  STABLE.  New prescription sent.  # Hypokalemia- 3.3 continue ;Kdur to 20 BID.    # Peripheral neuropathy - STABLE.   # DISPOSITION:  #  Follow up in  2 weeks; MD/ labs- cbc/cmp/cea; Dr.B  Cc; alyson-

## 2019-01-14 LAB — CEA: CEA: 13.4 ng/mL — ABNORMAL HIGH (ref 0.0–4.7)

## 2019-01-14 NOTE — Progress Notes (Signed)
[place] OFFICE PROGRESS NOTE  Patient Care Team: Patient, No Pcp Per as PCP - General (General Practice)  Cancer Staging No matching staging information was found for the patient.   Oncology History Overview Note  # [facility] 2017- COLON CANCER- STAGE IV [s/p right hemi-colectomy; pT4apN2 (7/19LN)]; Pre-op CEA- [id number]  PET-  mesenteric adenopathy/mediastinal adenopathy/right-sided subclavicular lymph node.    April 17th -START FOLFOX; May 1st Add Avastin;   Aug 16th- Disc OX [sec PN]; con 5FU-Avastin; NOV 27th CT C/A/P- CR; except for 93m LUL; 5FU-Avastin q 3 W [Poor tolerance to FOLFOX].  # June/July 2019- Progression of Liver/Abd LN; July 2019- START FOLFIRI + Avastin; STOP iri 02/25/2018- sec to diarrhea; cont Avastin + 5FU  # March 2018- L2 uptake MET s/p RT [[facility]2018]; mid April X-geva  # L1 ablation/ostecool-  Jan 3rd 2020-   # march 2nd 2020- decrease 5FU LET [place]5 CUV  to 20026mm2/  #June 17th, 2020-liver ablation; 3.5 cm lesion.  Intolerance to 5-FU plus Avastin.  Discontinued; Side effects/weight loss/ quality of life  #July 13th -Lonsurf cycle #1  # July 2nd CT scan-right laryngeal nerve palsy/hoarseness of voice [Dr. McQueen] CT-chest NEG; MRI brain negative.   # MOLECULAR TESTING: K-ras-EXON 2- MUTATED; MSI- STABLE.   DIAGNOSIS: colon ca  STAGE:  IV ;GOALS: pallaitive  CURRENT/MOST RECENT THERAPY:Lonsurf    Cancer of ascending colon metastatic to intra-abdominal lymph node (HCC)    INTERVAL HISTORY:  Nancy TAMASHIRO89.o.  female pleasant patient above history of metastatic adenocarcinoma of the colon currently on  Lonsurf cycle #2 is here for follow-up.    Patient notes denies any fevers or fevers or chills.  Patient appetite is fair.  She has not lost weight.  She continues with Marinol.  She is stable weight at 92 pounds.  Review of Systems  Constitutional: Positive for malaise/fatigue. Negative for chills, diaphoresis and  fever.  HENT: Negative for nosebleeds and sore throat.   Eyes: Negative for double vision.  Respiratory: Negative for cough, hemoptysis, sputum production, shortness of breath and wheezing.   Cardiovascular: Negative for chest pain, palpitations, orthopnea and leg swelling.  Gastrointestinal: Positive for nausea. Negative for abdominal pain, constipation, heartburn, melena and vomiting.  Genitourinary: Negative for dysuria, frequency and urgency.  Musculoskeletal: Positive for back pain. Negative for joint pain.  Skin: Negative.  Negative for itching and rash.  Neurological: Positive for tingling. Negative for dizziness, focal weakness, weakness and headaches.  Endo/Heme/Allergies: Does not bruise/bleed easily.  Psychiatric/Behavioral: Negative for depression. The patient is not nervous/anxious and does not have insomnia.       PAST MEDICAL HISTORY :  Past Medical History:  Diagnosis Date  . Chicken pox   . [place] cancer ([place]   Partial colectomy 07/2015 + chemo tx's  . Hypertension   . Hypokalemia   . Menopause    > 5 yrs  . Peripheral neuropathy due to chemotherapy ([facility]   bi lat feet    PAST SURGICAL HISTORY :   Past Surgical History:  Procedure Laterality Date  . BREAST BIOPSY Right    2007? pt unsure done by [facility]  . COLONOSCOPY    . COLONOSCOPY WITH PROPOFOL N/A 10/14/2015   Procedure: COLONOSCOPY WITH PROPOFOL;  Surgeon: PaHulen LusterMD;  [facility];  Service: Endoscopy;  Laterality: N/A;  . COLOSTOMY REVISION Right 08/09/2015   Procedure: COLON RESECTION RIGHT;  Surgeon: RiFlorene GlenMD;  Location: [facility] ORS;  Service: General;  Laterality: Right;  . ECTOPIC PREGNANCY SURGERY    . IR BONE TUMOR(S)RF ABLATION  05/24/2018  . IR KYPHO LUMBAR INC FX REDUCE BONE BX UNI/BIL CANNULATION INC/IMAGING  05/24/2018  . IR RADIOLOGIST EVAL & MGMT  05/08/2018  . IR RADIOLOGIST EVAL & MGMT  06/18/2018  . IR RADIOLOGIST EVAL & MGMT  07/31/2018  . IR RADIOLOGIST EVAL & MGMT   12/19/2018  . PORTACATH PLACEMENT N/A 09/01/2015   Procedure: INSERTION PORT-A-CATH;  Surgeon: Florene Glen, MD;  Location: [facility] ORS;  Service: General;  Laterality: N/A;  . RADIOLOGY WITH ANESTHESIA N/A 11/06/2018   Procedure: CT-MICROWAVE THERMAL ABLATION/LIVER;  Surgeon: Corrie Mckusick, DO;  Location: [facility];  Service: Anesthesiology;  Laterality: N/A;  . SHOULDER SURGERY Left     FAMILY HISTORY :   Family History  Problem Relation Age of Onset  . Hypertension Mother   . Arthritis Father   . Prostate cancer Father   . Diabetes Maternal Grandmother   . Colon cancer Maternal Grandfather        colon    SOCIAL HISTORY:   Social History   Tobacco Use  . Smoking status: Former Smoker    Packs/day: 0.25    Years: 2.00    Pack years: 0.50  . Smokeless tobacco: Never Used  . Tobacco comment: recently quit  Substance Use Topics  . Alcohol use: No    Alcohol/week: 0.0 standard drinks  . Drug use: No    ALLERGIES:  has No Known Allergies.  MEDICATIONS:  Current Outpatient Medications  Medication Sig Dispense Refill  . acetaminophen (TYLENOL) 325 MG tablet Take 2 tablets (650 mg total) by mouth every 6 (six) hours as needed for mild pain (or Fever >/= 101).    . Calcium Carbonate-Vitamin D (CALCIUM 600/VITAMIN D PO) Take 1 tablet by mouth every evening.     . diphenoxylate-atropine (LOMOTIL) 2.5-0.025 MG tablet Take 1 tablet by mouth 4 (four) times daily as needed for diarrhea or loose stools. Take it along with immodium 40 tablet 4  . dronabinol (MARINOL) 5 MG capsule Take 1 capsule (5 mg total) by mouth 2 (two) times daily before lunch and supper. 60 capsule 4  . DULoxetine (CYMBALTA) 20 MG capsule Take 2 capsules (40 mg total) by mouth daily. (Patient taking differently: Take 20 mg by mouth 2 (two) times daily. ) 180 capsule 3  . feeding supplement (BOOST HIGH PROTEIN) LIQD Take 1 Container by mouth daily.     . fentaNYL (DURAGESIC) 25 MCG/HR Place 1 patch onto the skin every  3 (three) days. 10 patch 0  . ferrous sulfate (FERROUSUL) 325 (65 FE) MG tablet Take 1 tablet (325 mg total) by mouth daily with breakfast. 90 tablet 3  . gabapentin (NEURONTIN) 300 MG capsule Take 1-2 capsules (300-600 mg total) by mouth See admin instructions. Take 1 capsule (300 mg) by mouth daily in the morning & take 2 capsules (600 mg) by mouth at night. 90 capsule 6  . HYDROcodone-acetaminophen (NORCO/VICODIN) 5-325 MG tablet Take 1 tablet by mouth 2 (two) times daily as needed (pain.). 60 tablet 0  . lidocaine-prilocaine (EMLA) cream Apply 1 application topically as needed. Apply generously over the Mediport 45 minutes prior to chemotherapy. (Patient taking differently: Apply 1 application topically as needed (prior to chemotherapy.). ) 30 g 6  . loperamide (IMODIUM) 2 MG capsule Take 2 mg by mouth 4 (four) times daily as needed for diarrhea or loose stools.     Marland Kitchen  Multiple Vitamin (MULTIVITAMIN WITH MINERALS) TABS tablet Take 1 tablet by mouth daily.    . ondansetron (ZOFRAN) 8 MG tablet One pill every 8 hours as needed for nausea/vomitting. (Patient taking differently: Take 8 mg by mouth every 8 (eight) hours as needed for nausea or vomiting. ) 60 tablet 0  . potassium chloride SA (K-DUR) 20 MEQ tablet Take 1 tablet (20 mEq total) by mouth 2 (two) times daily. 60 tablet 6  . prochlorperazine (COMPAZINE) 10 MG tablet Take 1 tablet (10 mg total) by mouth every 6 (six) hours as needed for nausea or vomiting. 90 tablet 3  . trifluridine-tipiracil (LONSURF) 20-8.19 MG tablet Take 2 tablets by mouth twice daily. Take 1 hr after AM & PM meals. Take on days 1-5, 8-12. Repeat every 28day 40 tablet 6   No current facility-administered medications for this visit.    Facility-Administered Medications Ordered in Other Visits  Medication Dose Route Frequency Provider Last Rate Last Dose  . sodium chloride flush (NS) 0.9 % injection 10 mL  10 mL Intravenous PRN Cammie Sickle, MD   10 mL at 10/04/15  0901  . sodium chloride flush (NS) 0.9 % injection 10 mL  10 mL Intravenous PRN Cammie Sickle, MD   10 mL at 07/02/17 1020    PHYSICAL EXAMINATION: ECOG PERFORMANCE STATUS: 1 - Symptomatic but completely ambulatory  BP (!) 154/79 (BP Location: Left Arm, Patient Position: Sitting, Cuff Size: Normal)   Pulse (!) 59   Temp 98.5 F (36.9 C) (Tympanic)   Resp 16   Wt 93 lb 12.8 oz (42.5 kg)   LMP  (LMP Unknown) Comment: LMP MORE THAN 5 YRS  BMI 18.32 kg/m   Filed Weights   01/13/19 1451  Weight: 93 lb 12.8 oz (42.5 kg)    Physical Exam  Constitutional: She is oriented to person, place, and time and well-developed, well-nourished, and in no distress.  She is alone. She is walking herself.  HENT:  Head: Normocephalic and atraumatic.  Mouth/Throat: Oropharynx is clear and moist. No oropharyngeal exudate.  Eyes: Pupils are equal, round, and reactive to light.  Neck: Normal range of motion. Neck supple.  Cardiovascular: Normal rate and regular rhythm.  Pulmonary/Chest: No respiratory distress. She has no wheezes.  Abdominal: Soft. Bowel sounds are normal. She exhibits no distension and no mass. There is no abdominal tenderness. There is no rebound and no guarding.  Musculoskeletal: Normal range of motion.        General: No tenderness or edema.  Neurological: She is alert and oriented to person, place, and time.  Skin: Skin is warm.  Psychiatric: Affect normal.       LABORATORY DATA:  I have reviewed the data as listed    Component Value Date/Time   NA 140 01/13/2019 1433   K 3.3 (L) 01/13/2019 1433   CL 106 01/13/2019 1433   CO2 27 01/13/2019 1433   GLUCOSE 91 01/13/2019 1433   BUN <5 (L) 01/13/2019 1433   CREATININE 0.59 01/13/2019 1433   CALCIUM 7.5 (L) 01/13/2019 1433   PROT 5.9 (L) 01/13/2019 1433   ALBUMIN 3.1 (L) 01/13/2019 1433   AST 12 (L) 01/13/2019 1433   ALT 7 01/13/2019 1433   ALKPHOS 53 01/13/2019 1433   BILITOT 0.4 01/13/2019 1433   GFRNONAA  >60 01/13/2019 1433   GFRAA >60 01/13/2019 1433    No results found for: SPEP, UPEP  Lab Results  Component Value Date   WBC 2.3 (L) 01/13/2019  NEUTROABS 1.4 (L) 01/13/2019   HGB 9.0 (L) 01/13/2019   HCT 28.3 (L) 01/13/2019   MCV 94.0 01/13/2019   PLT 68 (L) 01/13/2019      Chemistry      Component Value Date/Time   NA 140 01/13/2019 1433   K 3.3 (L) 01/13/2019 1433   CL 106 01/13/2019 1433   CO2 27 01/13/2019 1433   BUN <5 (L) 01/13/2019 1433   CREATININE 0.59 01/13/2019 1433      Component Value Date/Time   CALCIUM 7.5 (L) 01/13/2019 1433   ALKPHOS 53 01/13/2019 1433   AST 12 (L) 01/13/2019 1433   ALT 7 01/13/2019 1433   BILITOT 0.4 01/13/2019 1433       RADIOGRAPHIC STUDIES: I have personally reviewed the radiological images as listed and agreed with the findings in the report. No results found.   ASSESSMENT & PLAN:  Cancer of ascending colon metastatic to intra-abdominal lymph node [facility]) # Colon cancer- cecal/ right-sided; STAGE IV; JUne 2020- MRI increasing lesion in liver ~3.5cm [see below].  Status post ablation of the liver lesion. PSA trending at [place] likely secondary to recent ablation. Currently on Lonsurf.  # s/p cycle #2 finished 3 days ago. WBC 2.3; Hb 9; platelets- 68 from Lonsurf- monitor for now.   # weight loss-gaining weight today 92 pounds. STABLE.   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   # L2 metastases status post ablation [Jan third]-on fentanyl/hydrocodone as needed-  STABLE.  New prescription sent.  # Hypokalemia- 3.3 continue ;Kdur to 20 BID.    # Peripheral neuropathy - STABLE.   # DISPOSITION:  #  Follow up in  2 weeks; MD/ labs- cbc/cmp/cea; Dr.B  Cc; alyson-     Orders Placed This Encounter  Procedures  . CBC with Differential/Platelet    Standing Status:   Future    Standing Expiration Date:   01/13/2020  . Comprehensive metabolic panel    Standing Status:   Future    Standing  Expiration Date:   01/13/2020  . CEA    Standing Status:   Future    Standing Expiration Date:   01/13/2020   All questions were answered. The patient knows to call the clinic with any problems, questions or concerns.      Cammie Sickle, MD 01/14/2019 7:31 AM

## 2019-01-28 NOTE — Progress Notes (Signed)
Patient is coming in for follow up, she mentions back pain taking pain meds 6/10. She mentions trouble swallowing liquids and solids. She also has a little diarrhea.

## 2019-01-29 ENCOUNTER — Inpatient Hospital Stay: Payer: BC Managed Care – PPO

## 2019-01-29 ENCOUNTER — Inpatient Hospital Stay (HOSPITAL_BASED_OUTPATIENT_CLINIC_OR_DEPARTMENT_OTHER): Payer: BC Managed Care – PPO | Admitting: Internal Medicine

## 2019-01-29 ENCOUNTER — Inpatient Hospital Stay: Payer: BC Managed Care – PPO | Attending: Internal Medicine

## 2019-01-29 ENCOUNTER — Other Ambulatory Visit: Payer: Self-pay

## 2019-01-29 VITALS — BP 170/76 | HR 66 | Temp 98.1°F | Resp 20 | Ht 60.0 in | Wt 93.3 lb

## 2019-01-29 DIAGNOSIS — C772 Secondary and unspecified malignant neoplasm of intra-abdominal lymph nodes: Secondary | ICD-10-CM

## 2019-01-29 DIAGNOSIS — G893 Neoplasm related pain (acute) (chronic): Secondary | ICD-10-CM | POA: Diagnosis not present

## 2019-01-29 DIAGNOSIS — G620 Drug-induced polyneuropathy: Secondary | ICD-10-CM | POA: Diagnosis not present

## 2019-01-29 DIAGNOSIS — I10 Essential (primary) hypertension: Secondary | ICD-10-CM | POA: Insufficient documentation

## 2019-01-29 DIAGNOSIS — Z87891 Personal history of nicotine dependence: Secondary | ICD-10-CM | POA: Diagnosis not present

## 2019-01-29 DIAGNOSIS — R110 Nausea: Secondary | ICD-10-CM | POA: Diagnosis not present

## 2019-01-29 DIAGNOSIS — Z79899 Other long term (current) drug therapy: Secondary | ICD-10-CM | POA: Insufficient documentation

## 2019-01-29 DIAGNOSIS — R109 Unspecified abdominal pain: Secondary | ICD-10-CM | POA: Diagnosis not present

## 2019-01-29 DIAGNOSIS — C7951 Secondary malignant neoplasm of bone: Secondary | ICD-10-CM

## 2019-01-29 DIAGNOSIS — E876 Hypokalemia: Secondary | ICD-10-CM | POA: Insufficient documentation

## 2019-01-29 DIAGNOSIS — C182 Malignant neoplasm of ascending colon: Secondary | ICD-10-CM | POA: Insufficient documentation

## 2019-01-29 LAB — CBC WITH DIFFERENTIAL/PLATELET
Abs Immature Granulocytes: 0.01 10*3/uL (ref 0.00–0.07)
Basophils Absolute: 0 10*3/uL (ref 0.0–0.1)
Basophils Relative: 0 %
Eosinophils Absolute: 0 10*3/uL (ref 0.0–0.5)
Eosinophils Relative: 0 %
HCT: 28.6 % — ABNORMAL LOW (ref 36.0–46.0)
Hemoglobin: 9 g/dL — ABNORMAL LOW (ref 12.0–15.0)
Immature Granulocytes: 1 %
Lymphocytes Relative: 29 %
Lymphs Abs: 0.6 10*3/uL — ABNORMAL LOW (ref 0.7–4.0)
MCH: 31.5 pg (ref 26.0–34.0)
MCHC: 31.5 g/dL (ref 30.0–36.0)
MCV: 100 fL (ref 80.0–100.0)
Monocytes Absolute: 0.4 10*3/uL (ref 0.1–1.0)
Monocytes Relative: 19 %
Neutro Abs: 1.1 10*3/uL — ABNORMAL LOW (ref 1.7–7.7)
Neutrophils Relative %: 51 %
Platelets: 206 10*3/uL (ref 150–400)
RBC: 2.86 MIL/uL — ABNORMAL LOW (ref 3.87–5.11)
RDW: 21.7 % — ABNORMAL HIGH (ref 11.5–15.5)
WBC: 2.1 10*3/uL — ABNORMAL LOW (ref 4.0–10.5)
nRBC: 0 % (ref 0.0–0.2)

## 2019-01-29 LAB — COMPREHENSIVE METABOLIC PANEL
ALT: 14 U/L (ref 0–44)
AST: 19 U/L (ref 15–41)
Albumin: 3.5 g/dL (ref 3.5–5.0)
Alkaline Phosphatase: 86 U/L (ref 38–126)
Anion gap: 10 (ref 5–15)
BUN: <5 mg/dL — ABNORMAL LOW (ref 6–20)
CO2: 28 mmol/L (ref 22–32)
Calcium: 8.2 mg/dL — ABNORMAL LOW (ref 8.9–10.3)
Chloride: 105 mmol/L (ref 98–111)
Creatinine, Ser: 0.65 mg/dL (ref 0.44–1.00)
GFR calc Af Amer: >60 mL/min (ref >60)
GFR calc non Af Amer: >60 mL/min (ref >60)
Glucose, Bld: 89 mg/dL (ref 70–99)
Potassium: 2.5 mmol/L — CL (ref 3.5–5.1)
Sodium: 143 mmol/L (ref 135–145)
Total Bilirubin: 0.3 mg/dL (ref 0.3–1.2)
Total Protein: 6.2 g/dL — ABNORMAL LOW (ref 6.5–8.1)

## 2019-01-29 LAB — MAGNESIUM: Magnesium: 1.9 mg/dL (ref 1.7–2.4)

## 2019-01-29 MED ORDER — SODIUM CHLORIDE 0.9% FLUSH
10.0000 mL | Freq: Once | INTRAVENOUS | Status: AC
  Administered 2019-01-29: 10 mL via INTRAVENOUS
  Filled 2019-01-29: qty 10

## 2019-01-29 MED ORDER — DEXAMETHASONE SODIUM PHOSPHATE 10 MG/ML IJ SOLN
8.0000 mg | Freq: Once | INTRAMUSCULAR | Status: AC
  Administered 2019-01-29: 8 mg via INTRAVENOUS
  Filled 2019-01-29: qty 1

## 2019-01-29 MED ORDER — HEPARIN SOD (PORK) LOCK FLUSH 100 UNIT/ML IV SOLN
500.0000 [IU] | Freq: Once | INTRAVENOUS | Status: AC
  Administered 2019-01-29: 500 [IU]

## 2019-01-29 MED ORDER — POTASSIUM CHLORIDE 20 MEQ/100ML IV SOLN
20.0000 meq | Freq: Once | INTRAVENOUS | Status: AC
  Administered 2019-01-29: 11:00:00 20 meq via INTRAVENOUS

## 2019-01-29 MED ORDER — SODIUM CHLORIDE 0.9 % IV SOLN
Freq: Once | INTRAVENOUS | Status: AC
  Administered 2019-01-29: 11:00:00 via INTRAVENOUS
  Filled 2019-01-29: qty 250

## 2019-01-29 NOTE — Assessment & Plan Note (Addendum)
#   Colon cancer- cecal/ right-sided; STAGE IV; JUne 2020- MRI increasing lesion in liver ~3.5cm [see below].  Status post ablation of the liver lesion. PSA trending at [place] likely secondary to recent ablation. Currently on Lonsurf.  #Currently on Lonsurf cycle #3-started 2 days ago.  Recommend stopping Lonsurf at this time [feeling poorly ; see discussion below]  #Bilateral subcostal/abdominal discomfort-unclear etiology.  Not suspicious for any acute process.  Question musculoskeletal symptoms from severe hypokalemia.  Discontinue iron.  See below  #Severe hypokalemia- 2.5; sec to diarrhea/Lonsurf; recommend KCL IV ;Kdur to 20 BID.    #Nausea poor p.o. intake-secondary to Lonsurf. recommend dexamethasone.   # weight loss-currently stable at 92 pounds.  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   #Severe hypokalemia- 2.5; sec to diarrhea/Lonsurf; recommend KCL IV ;Kdur to 20 BID.  Recommend calling [facility] for more refills.  # Peripheral neuropathy -stable  #Reminded the patient to call us back-by the end of the week if symptoms do not improve.   # DISPOSITION:  # Today- KCL 20/Dex IV #  Follow up in  2 weeks; MD/ labs- cbc/cmp/cea; Dr.B   Cc; alyson-

## 2019-01-29 NOTE — Patient Instructions (Addendum)
#   STOP LONSURF # STOP IRON pill # Take Kdur 20 twice day.

## 2019-01-29 NOTE — Progress Notes (Signed)
[place] OFFICE PROGRESS NOTE  Patient Care Team: Patient, No Pcp Per as PCP - General (General Practice)  Cancer Staging No matching staging information was found for the patient.   Oncology History Overview Note  # [facility] 2017- COLON CANCER- STAGE IV [s/p right hemi-colectomy; pT4apN2 (7/19LN)]; Pre-op CEA- [id number]  PET-  mesenteric adenopathy/mediastinal adenopathy/right-sided subclavicular lymph node.    April 17th -START FOLFOX; May 1st Add Avastin;   Aug 16th- Disc OX [sec PN]; con 5FU-Avastin; NOV 27th CT C/A/P- CR; except for 1m LUL; 5FU-Avastin q 3 W [Poor tolerance to FOLFOX].  # June/July 2019- Progression of Liver/Abd LN; July 2019- START FOLFIRI + Avastin; STOP iri 02/25/2018- sec to diarrhea; cont Avastin + 5FU  # March 2018- L2 uptake MET s/p RT [[facility]2018]; mid April X-geva  # L1 ablation/ostecool-  Jan 3rd 2020-   # march 2nd 2020- decrease 5FU LET [place]5 CUV  to '2000mg'$ /m2/  #June 17th, 2020-liver ablation; 3.5 cm lesion.  Intolerance to 5-FU plus Avastin.  Discontinued; Side effects/weight loss/ quality of life  #July 13th -Lonsurf cycle #1  # July 2nd CT scan-right laryngeal nerve palsy/hoarseness of voice [Dr. McQueen] CT-chest NEG; MRI brain negative.   # MOLECULAR TESTING: K-ras-EXON 2- MUTATED; MSI- STABLE.   DIAGNOSIS: colon ca  STAGE:  IV ;GOALS: pallaitive  CURRENT/MOST RECENT THERAPY:Lonsurf    Cancer of ascending colon metastatic to intra-abdominal lymph node (HCC)    INTERVAL HISTORY:  Nancy CHARNLEY89y.o.  female pleasant patient above history of metastatic adenocarcinoma of the colon currently on  Lonsurf cycle #3 [started 2 days ago] is here for follow-up.    Patient states that she has been feeling poorly for the last week or so.  Complains of bilateral subcostal pain cramping radiating to the back.  Subjective warmth but no fevers or rigors.  Patient also noted to have diarrhea 2-3 loose stools a day.   Otherwise appetite is fair.  Not lost any weight.  She has been taking Marinol every other day.  Positive for nausea no vomiting.  Patient states to be compliant with her potassium.  Review of Systems  Constitutional: Positive for malaise/fatigue. Negative for chills, diaphoresis and fever.  HENT: Negative for nosebleeds and sore throat.   Eyes: Negative for double vision.  Respiratory: Negative for cough, hemoptysis, sputum production, shortness of breath and wheezing.   Cardiovascular: Negative for chest pain, palpitations, orthopnea and leg swelling.  Gastrointestinal: Positive for abdominal pain and nausea. Negative for constipation, heartburn, melena and vomiting.  Genitourinary: Negative for dysuria, frequency and urgency.  Musculoskeletal: Positive for back pain. Negative for joint pain.  Skin: Negative.  Negative for itching and rash.  Neurological: Positive for tingling. Negative for dizziness, focal weakness, weakness and headaches.  Endo/Heme/Allergies: Does not bruise/bleed easily.  Psychiatric/Behavioral: Negative for depression. The patient is not nervous/anxious and does not have insomnia.       PAST MEDICAL HISTORY :  Past Medical History:  Diagnosis Date  . Chicken pox   . Colon cancer ([facility]    Partial colectomy 07/2015 + chemo tx's  . Hypertension   . Hypokalemia   . Menopause    > 5 yrs  . Peripheral neuropathy due to chemotherapy ([facility]    bi lat feet    PAST SURGICAL HISTORY :   Past Surgical History:  Procedure Laterality Date  . BREAST BIOPSY Right    2007? pt unsure done by [facility]  . COLONOSCOPY    .  COLONOSCOPY WITH PROPOFOL N/A 10/14/2015   Procedure: COLONOSCOPY WITH PROPOFOL;  Surgeon: Hulen Luster, MD;  Location: [facility] ENDOSCOPY;  Service: Endoscopy;  Laterality: N/A;  . COLOSTOMY REVISION Right 08/09/2015   Procedure: COLON RESECTION RIGHT;  Surgeon: Florene Glen, MD;  Location: [facility] ORS;  Service: General;  Laterality: Right;  . ECTOPIC PREGNANCY  SURGERY    . IR BONE TUMOR(S)RF ABLATION  05/24/2018  . IR KYPHO LUMBAR INC FX REDUCE BONE BX UNI/BIL CANNULATION INC/IMAGING  05/24/2018  . IR RADIOLOGIST EVAL & MGMT  05/08/2018  . IR RADIOLOGIST EVAL & MGMT  06/18/2018  . IR RADIOLOGIST EVAL & MGMT  07/31/2018  . IR RADIOLOGIST EVAL & MGMT  12/19/2018  . PORTACATH PLACEMENT N/A 09/01/2015   Procedure: INSERTION PORT-A-CATH;  Surgeon: Florene Glen, MD;  Location: [facility] ORS;  Service: General;  Laterality: N/A;  . RADIOLOGY WITH ANESTHESIA N/A 11/06/2018   Procedure: CT-MICROWAVE THERMAL ABLATION/LIVER;  Surgeon: Corrie Mckusick, DO;  Location: [facility] ORS;  Service: Anesthesiology;  Laterality: N/A;  . SHOULDER SURGERY Left     FAMILY HISTORY :   Family History  Problem Relation Age of Onset  . Hypertension Mother   . Arthritis Father   . Prostate cancer Father   . Diabetes Maternal Grandmother   . Colon cancer Maternal Grandfather        colon    SOCIAL HISTORY:   Social History   Tobacco Use  . Smoking status: Former Smoker    Packs/day: 0.25    Years: 2.00    Pack years: 0.50  . Smokeless tobacco: Never Used  . Tobacco comment: recently quit  Substance Use Topics  . Alcohol use: No    Alcohol/week: 0.0 standard drinks  . Drug use: No    ALLERGIES:  has No Known Allergies.  MEDICATIONS:  Current Outpatient Medications  Medication Sig Dispense Refill  . acetaminophen (TYLENOL) 325 MG tablet Take 2 tablets (650 mg total) by mouth every 6 (six) hours as needed for mild pain (or Fever >/= 101).    . Calcium Carbonate-Vitamin D (CALCIUM 600/VITAMIN D PO) Take 1 tablet by mouth every evening.     . diphenoxylate-atropine (LOMOTIL) 2.5-0.025 MG tablet Take 1 tablet by mouth 4 (four) times daily as needed for diarrhea or loose stools. Take it along with immodium 40 tablet 4  . dronabinol (MARINOL) 5 MG capsule Take 1 capsule (5 mg total) by mouth 2 (two) times daily before lunch and supper. 60 capsule 4  . DULoxetine (CYMBALTA) 20 MG  capsule Take 2 capsules (40 mg total) by mouth daily. (Patient taking differently: Take 20 mg by mouth 2 (two) times daily. ) 180 capsule 3  . feeding supplement (BOOST HIGH PROTEIN) LIQD Take 1 Container by mouth daily.     . fentaNYL (DURAGESIC) 25 MCG/HR Place 1 patch onto the skin every 3 (three) days. 10 patch 0  . ferrous sulfate (FERROUSUL) 325 (65 FE) MG tablet Take 1 tablet (325 mg total) by mouth daily with breakfast. 90 tablet 3  . gabapentin (NEURONTIN) 300 MG capsule Take 1-2 capsules (300-600 mg total) by mouth See admin instructions. Take 1 capsule (300 mg) by mouth daily in the morning & take 2 capsules (600 mg) by mouth at night. 90 capsule 6  . HYDROcodone-acetaminophen (NORCO/VICODIN) 5-325 MG tablet Take 1 tablet by mouth 2 (two) times daily as needed (pain.). 60 tablet 0  . lidocaine-prilocaine (EMLA) cream Apply 1 application topically as needed. Apply generously over  the Mediport 45 minutes prior to chemotherapy. (Patient taking differently: Apply 1 application topically as needed (prior to chemotherapy.). ) 30 g 6  . loperamide (IMODIUM) 2 MG capsule Take 2 mg by mouth 4 (four) times daily as needed for diarrhea or loose stools.     . Multiple Vitamin (MULTIVITAMIN WITH MINERALS) TABS tablet Take 1 tablet by mouth daily.    . ondansetron (ZOFRAN) 8 MG tablet One pill every 8 hours as needed for nausea/vomitting. (Patient taking differently: Take 8 mg by mouth every 8 (eight) hours as needed for nausea or vomiting. ) 60 tablet 0  . potassium chloride SA (K-DUR) 20 MEQ tablet Take 1 tablet (20 mEq total) by mouth 2 (two) times daily. 60 tablet 6  . prochlorperazine (COMPAZINE) 10 MG tablet Take 1 tablet (10 mg total) by mouth every 6 (six) hours as needed for nausea or vomiting. 90 tablet 3  . trifluridine-tipiracil (LONSURF) 20-8.19 MG tablet Take 2 tablets by mouth twice daily. Take 1 hr after AM & PM meals. Take on days 1-5, 8-12. Repeat every 28day 40 tablet 6   No current  facility-administered medications for this visit.    Facility-Administered Medications Ordered in Other Visits  Medication Dose Route Frequency Provider Last Rate Last Dose  . sodium chloride flush (NS) 0.9 % injection 10 mL  10 mL Intravenous PRN Cammie Sickle, MD   10 mL at 10/04/15 0901  . sodium chloride flush (NS) 0.9 % injection 10 mL  10 mL Intravenous PRN Cammie Sickle, MD   10 mL at 07/02/17 1020    PHYSICAL EXAMINATION: ECOG PERFORMANCE STATUS: 1 - Symptomatic but completely ambulatory  BP (!) 170/76   Pulse 66   Temp 98.1 F (36.7 C) (Tympanic)   Resp 20   Ht 5' (1.524 m)   Wt 93 lb 4.8 oz (42.3 kg)   LMP  (LMP Unknown) Comment: LMP MORE THAN 5 YRS  BMI 18.22 kg/m   Filed Weights   01/29/19 0950  Weight: 93 lb 4.8 oz (42.3 kg)    Physical Exam  Constitutional: She is oriented to person, place, and time and well-developed, well-nourished, and in no distress.  She is alone. She is walking herself.  HENT:  Head: Normocephalic and atraumatic.  Mouth/Throat: Oropharynx is clear and moist. No oropharyngeal exudate.  Eyes: Pupils are equal, round, and reactive to light.  Neck: Normal range of motion. Neck supple.  Cardiovascular: Normal rate and regular rhythm.  Pulmonary/Chest: Effort normal and breath sounds normal. No respiratory distress. She has no wheezes.  Abdominal: Soft. Bowel sounds are normal. She exhibits no distension and no mass. There is no abdominal tenderness. There is no rebound and no guarding.  Musculoskeletal: Normal range of motion.        General: No tenderness or edema.  Neurological: She is alert and oriented to person, place, and time.  Skin: Skin is warm.  Psychiatric: Affect normal.       LABORATORY DATA:  I have reviewed the data as listed    Component Value Date/Time   NA 143 01/29/2019 0933   K 2.5 (LL) 01/29/2019 0933   CL 105 01/29/2019 0933   CO2 28 01/29/2019 0933   GLUCOSE 89 01/29/2019 0933   BUN <5 (L)  01/29/2019 0933   CREATININE 0.65 01/29/2019 0933   CALCIUM 8.2 (L) 01/29/2019 0933   PROT 6.2 (L) 01/29/2019 0933   ALBUMIN 3.5 01/29/2019 0933   AST 19 01/29/2019 0933  ALT 14 01/29/2019 0933   ALKPHOS 86 01/29/2019 0933   BILITOT 0.3 01/29/2019 0933   GFRNONAA >60 01/29/2019 0933   GFRAA >60 01/29/2019 0933    No results found for: SPEP, UPEP  Lab Results  Component Value Date   WBC 2.1 (L) 01/29/2019   NEUTROABS 1.1 (L) 01/29/2019   HGB 9.0 (L) 01/29/2019   HCT 28.6 (L) 01/29/2019   MCV 100.0 01/29/2019   PLT 206 01/29/2019      Chemistry      Component Value Date/Time   NA 143 01/29/2019 0933   K 2.5 (LL) 01/29/2019 0933   CL 105 01/29/2019 0933   CO2 28 01/29/2019 0933   BUN <5 (L) 01/29/2019 0933   CREATININE 0.65 01/29/2019 0933      Component Value Date/Time   CALCIUM 8.2 (L) 01/29/2019 0933   ALKPHOS 86 01/29/2019 0933   AST 19 01/29/2019 0933   ALT 14 01/29/2019 0933   BILITOT 0.3 01/29/2019 0933       RADIOGRAPHIC STUDIES: I have personally reviewed the radiological images as listed and agreed with the findings in the report. No results found.   ASSESSMENT & PLAN:  Cancer of ascending colon metastatic to intra-abdominal lymph node [facility]) # Colon cancer- cecal/ right-sided; STAGE IV; JUne 2020- MRI increasing lesion in liver ~3.5cm [see below].  Status post ablation of the liver lesion. PSA trending at [place] likely secondary to recent ablation. Currently on Lonsurf.  #Currently on Lonsurf cycle #3-started 2 days ago.  Recommend stopping Lonsurf at this time [feeling poorly ; see discussion below]  #Bilateral subcostal/abdominal discomfort-unclear etiology.  Not suspicious for any acute process.  Question musculoskeletal symptoms from severe hypokalemia.  Discontinue iron.  See below  #Severe hypokalemia- 2.5; sec to diarrhea/Lonsurf; recommend KCL IV ;Kdur to 20 BID.    #Nausea poor p.o. intake-secondary to Lonsurf. recommend dexamethasone.    # weight loss-currently stable at 92 pounds.  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   #Severe hypokalemia- 2.5; sec to diarrhea/Lonsurf; recommend KCL IV ;Kdur to 20 BID.  Recommend calling [facility] for more refills.  # Peripheral neuropathy -stable  #Reminded the patient to call us back-by the end of the week if symptoms do not improve.   # DISPOSITION:  # Today- KCL 20/Dex IV #  Follow up in  2 weeks; MD/ labs- cbc/cmp/cea; Dr.B   Cc; alyson-     Orders Placed This Encounter  Procedures  . Magnesium    Standing Status:   Future    Number of Occurrences:   1    Standing Expiration Date:   01/29/2020  . CBC with Differential    Standing Status:   Future    Standing Expiration Date:   01/29/2020  . Comprehensive metabolic panel    Standing Status:   Future    Standing Expiration Date:   01/29/2020  . CEA    Standing Status:   Future    Standing Expiration Date:   01/29/2020   All questions were answered. The patient knows to call the clinic with any problems, questions or concerns.      Cammie Sickle, MD 01/29/2019 5:08 PM

## 2019-01-30 LAB — CEA: CEA: 19.7 ng/mL — ABNORMAL HIGH (ref 0.0–4.7)

## 2019-02-12 ENCOUNTER — Ambulatory Visit: Payer: BC Managed Care – PPO | Admitting: Internal Medicine

## 2019-02-12 ENCOUNTER — Other Ambulatory Visit: Payer: BC Managed Care – PPO

## 2019-02-13 ENCOUNTER — Encounter: Payer: Self-pay | Admitting: Internal Medicine

## 2019-02-14 ENCOUNTER — Inpatient Hospital Stay: Payer: BC Managed Care – PPO

## 2019-02-14 ENCOUNTER — Inpatient Hospital Stay (HOSPITAL_BASED_OUTPATIENT_CLINIC_OR_DEPARTMENT_OTHER): Payer: BC Managed Care – PPO | Admitting: Internal Medicine

## 2019-02-14 ENCOUNTER — Other Ambulatory Visit: Payer: Self-pay

## 2019-02-14 VITALS — BP 173/79 | HR 76 | Temp 97.8°F | Resp 16 | Wt 95.0 lb

## 2019-02-14 DIAGNOSIS — E8809 Other disorders of plasma-protein metabolism, not elsewhere classified: Secondary | ICD-10-CM

## 2019-02-14 DIAGNOSIS — E876 Hypokalemia: Secondary | ICD-10-CM

## 2019-02-14 DIAGNOSIS — C772 Secondary and unspecified malignant neoplasm of intra-abdominal lymph nodes: Secondary | ICD-10-CM | POA: Diagnosis not present

## 2019-02-14 DIAGNOSIS — C182 Malignant neoplasm of ascending colon: Secondary | ICD-10-CM

## 2019-02-14 LAB — CBC WITH DIFFERENTIAL/PLATELET
Abs Immature Granulocytes: 0.01 10*3/uL (ref 0.00–0.07)
Basophils Absolute: 0 10*3/uL (ref 0.0–0.1)
Basophils Relative: 0 %
Eosinophils Absolute: 0 10*3/uL (ref 0.0–0.5)
Eosinophils Relative: 1 %
HCT: 27.8 % — ABNORMAL LOW (ref 36.0–46.0)
Hemoglobin: 8.7 g/dL — ABNORMAL LOW (ref 12.0–15.0)
Immature Granulocytes: 0 %
Lymphocytes Relative: 22 %
Lymphs Abs: 0.8 10*3/uL (ref 0.7–4.0)
MCH: 31.5 pg (ref 26.0–34.0)
MCHC: 31.3 g/dL (ref 30.0–36.0)
MCV: 100.7 fL — ABNORMAL HIGH (ref 80.0–100.0)
Monocytes Absolute: 0.4 10*3/uL (ref 0.1–1.0)
Monocytes Relative: 11 %
Neutro Abs: 2.4 10*3/uL (ref 1.7–7.7)
Neutrophils Relative %: 66 %
Platelets: 212 10*3/uL (ref 150–400)
RBC: 2.76 MIL/uL — ABNORMAL LOW (ref 3.87–5.11)
RDW: 21.7 % — ABNORMAL HIGH (ref 11.5–15.5)
WBC: 3.7 10*3/uL — ABNORMAL LOW (ref 4.0–10.5)
nRBC: 0 % (ref 0.0–0.2)

## 2019-02-14 LAB — COMPREHENSIVE METABOLIC PANEL
ALT: 12 U/L (ref 0–44)
AST: 19 U/L (ref 15–41)
Albumin: 3.1 g/dL — ABNORMAL LOW (ref 3.5–5.0)
Alkaline Phosphatase: 112 U/L (ref 38–126)
Anion gap: 9 (ref 5–15)
BUN: 6 mg/dL (ref 6–20)
CO2: 24 mmol/L (ref 22–32)
Calcium: 8 mg/dL — ABNORMAL LOW (ref 8.9–10.3)
Chloride: 108 mmol/L (ref 98–111)
Creatinine, Ser: 0.69 mg/dL (ref 0.44–1.00)
GFR calc Af Amer: >60 mL/min (ref >60)
GFR calc non Af Amer: >60 mL/min (ref >60)
Glucose, Bld: 88 mg/dL (ref 70–99)
Potassium: 2.7 mmol/L — CL (ref 3.5–5.1)
Sodium: 141 mmol/L (ref 135–145)
Total Bilirubin: 0.4 mg/dL (ref 0.3–1.2)
Total Protein: 5.8 g/dL — ABNORMAL LOW (ref 6.5–8.1)

## 2019-02-14 LAB — PROTEIN / CREATININE RATIO, URINE
Creatinine, Urine: 103 mg/dL
Protein Creatinine Ratio: 0.15 mg/mg{Cre} (ref 0.00–0.15)
Total Protein, Urine: 15 mg/dL

## 2019-02-14 MED ORDER — FENTANYL 25 MCG/HR TD PT72: 1.0000 | MEDICATED_PATCH | TRANSDERMAL | 0 refills | Status: DC

## 2019-02-14 NOTE — Patient Instructions (Signed)
#  Take potassium pills 3 times a day./Eat bananas/raisins/tomatoes

## 2019-02-14 NOTE — Progress Notes (Signed)
[place] OFFICE PROGRESS NOTE  Patient Care Team: Patient, No Pcp Per as PCP - General (General Practice)  Cancer Staging No matching staging information was found for the patient.   Oncology History Overview Note  # [facility] 2017- COLON CANCER- STAGE IV [s/p right hemi-colectomy; pT4apN2 (7/19LN)]; Pre-op CEA- [id number]  PET-  mesenteric adenopathy/mediastinal adenopathy/right-sided subclavicular lymph node.    April 17th -START FOLFOX; May 1st Add Avastin;   Aug 16th- Disc OX [sec PN]; con 5FU-Avastin; NOV 27th CT C/A/P- CR; except for 34m LUL; 5FU-Avastin q 3 W [Poor tolerance to FOLFOX].  # June/July 2019- Progression of Liver/Abd LN; July 2019- START FOLFIRI + Avastin; STOP iri 02/25/2018- sec to diarrhea; cont Avastin + 5FU  # March 2018- L2 uptake MET s/p RT [[facility]2018]; mid April X-geva  # L1 ablation/ostecool-  Jan 3rd 2020-   # march 2nd 2020- decrease 5FU LET [place]5 CUV  to '2000mg'$ /m2/  #June 17th, 2020-liver ablation; 3.5 cm lesion.  Intolerance to 5-FU plus Avastin.  Discontinued; Side effects/weight loss/ quality of life  #July 13th -Lonsurf cycle #1  # July 2nd CT scan-right laryngeal nerve palsy/hoarseness of voice [Dr. McQueen] CT-chest NEG; MRI brain negative.   # MOLECULAR TESTING: K-ras-EXON 2- MUTATED; MSI- STABLE.   DIAGNOSIS: colon ca  STAGE:  IV ;GOALS: pallaitive  CURRENT/MOST RECENT THERAPY:Lonsurf    Cancer of ascending colon metastatic to intra-abdominal lymph node (HCC)    INTERVAL HISTORY:  Nancy TISDELL89y.[id number].  female pleasant patient above history of metastatic adenocarcinoma of the colon-most recently on Lonsurf status post 2 cycles.  Third cycle of Lonsurf was held 2 weeks ago because of poor tolerance.  Patient continues to have 1-2 loose stools a day.  Otherwise she has not lost weight.  No nausea no vomiting.  She feels fatigued.  No fever chills.  Clinically slightly improved from last visit.  Continues to have  intermittent abdominal pain with nausea.  Review of Systems  Constitutional: Positive for malaise/fatigue. Negative for chills, diaphoresis and fever.  HENT: Negative for nosebleeds and sore throat.   Eyes: Negative for double vision.  Respiratory: Negative for cough, hemoptysis, sputum production, shortness of breath and wheezing.   Cardiovascular: Negative for chest pain, palpitations, orthopnea and leg swelling.  Gastrointestinal: Positive for abdominal pain and nausea. Negative for constipation, heartburn, melena and vomiting.  Genitourinary: Negative for dysuria, frequency and urgency.  Musculoskeletal: Positive for back pain. Negative for joint pain.  Skin: Negative.  Negative for itching and rash.  Neurological: Positive for tingling. Negative for dizziness, focal weakness, weakness and headaches.  Endo/Heme/Allergies: Does not bruise/bleed easily.  Psychiatric/Behavioral: Negative for depression. The patient is not nervous/anxious and does not have insomnia.       PAST MEDICAL HISTORY :  Past Medical History:  Diagnosis Date  . Chicken pox   . [place]    Partial colectomy 07/2015 + chemo tx's  . Hypertension   . Hypokalemia   . Menopause    > 5 yrs  . Peripheral [place] due to [place] ([place]    bi lat feet    PAST SURGICAL HISTORY :   Past Surgical History:  Procedure Laterality Date  . BREAST BIOPSY Right    2007? pt unsure done by [facility]  . COLONOSCOPY    . COLONOSCOPY WITH PROPOFOL N/A 10/14/2015   Procedure: COLONOSCOPY WITH PROPOFOL;  Surgeon: PHulen Luster MD;  [facility];  Service: Endoscopy;  Laterality: N/A;  .  COLOSTOMY REVISION Right 08/09/2015   Procedure: COLON RESECTION RIGHT;  Surgeon: Florene Glen, MD;  Location: [facility] ORS;  Service: General;  Laterality: Right;  . ECTOPIC PREGNANCY SURGERY    . IR BONE TUMOR(S)RF ABLATION  05/24/2018  . IR KYPHO LUMBAR INC FX REDUCE BONE BX UNI/BIL CANNULATION INC/IMAGING  05/24/2018  . IR  RADIOLOGIST EVAL & MGMT  05/08/2018  . IR RADIOLOGIST EVAL & MGMT  06/18/2018  . IR RADIOLOGIST EVAL & MGMT  07/31/2018  . IR RADIOLOGIST EVAL & MGMT  12/19/2018  . PORTACATH PLACEMENT N/A 09/01/2015   Procedure: INSERTION PORT-A-CATH;  Surgeon: Florene Glen, MD;  Location: [facility] ORS;  Service: General;  Laterality: N/A;  . RADIOLOGY WITH ANESTHESIA N/A 11/06/2018   Procedure: CT-MICROWAVE THERMAL ABLATION/LIVER;  Surgeon: Corrie Mckusick, DO;  Location: [facility] ORS;  Service: Anesthesiology;  Laterality: N/A;  . SHOULDER SURGERY Left     FAMILY HISTORY :   Family History  Problem Relation Age of Onset  . Hypertension Mother   . Arthritis Father   . Prostate cancer Father   . Diabetes Maternal Grandmother   . Colon cancer Maternal Grandfather        colon    SOCIAL HISTORY:   Social History   Tobacco Use  . Smoking status: Former Smoker    Packs/day: 0.25    Years: 2.00    Pack years: 0.50  . Smokeless tobacco: Never Used  . Tobacco comment: recently quit  Substance Use Topics  . Alcohol use: No    Alcohol/week: 0.0 standard drinks  . Drug use: No    ALLERGIES:  has No Known Allergies.  MEDICATIONS:  Current Outpatient Medications  Medication Sig Dispense Refill  . acetaminophen (TYLENOL) 325 MG tablet Take 2 tablets (650 mg total) by mouth every 6 (six) hours as needed for mild pain (or Fever >/= 101).    . Calcium Carbonate-Vitamin D (CALCIUM 600/VITAMIN D PO) Take 1 tablet by mouth every evening.     . diphenoxylate-atropine (LOMOTIL) 2.5-0.025 MG tablet Take 1 tablet by mouth 4 (four) times daily as needed for diarrhea or loose stools. Take it along with immodium 40 tablet 4  . dronabinol (MARINOL) 5 MG capsule Take 1 capsule (5 mg total) by mouth 2 (two) times daily before lunch and supper. 60 capsule 4  . DULoxetine (CYMBALTA) 20 MG capsule Take 2 capsules (40 mg total) by mouth daily. (Patient taking differently: Take 20 mg by mouth 2 (two) times daily. ) 180 capsule 3   . feeding supplement (BOOST HIGH PROTEIN) LIQD Take 1 Container by mouth daily.     . fentaNYL (DURAGESIC) 25 MCG/HR Place 1 patch onto the skin every 3 (three) days. 10 patch 0  . ferrous sulfate (FERROUSUL) 325 (65 FE) MG tablet Take 1 tablet (325 mg total) by mouth daily with breakfast. 90 tablet 3  . gabapentin (NEURONTIN) 300 MG capsule Take 1-2 capsules (300-600 mg total) by mouth See admin instructions. Take 1 capsule (300 mg) by mouth daily in the morning & take 2 capsules (600 mg) by mouth at night. 90 capsule 6  . HYDROcodone-acetaminophen (NORCO/VICODIN) 5-325 MG tablet Take 1 tablet by mouth 2 (two) times daily as needed (pain.). 60 tablet 0  . lidocaine-prilocaine (EMLA) cream Apply 1 application topically as needed. Apply generously over the Mediport 45 minutes prior to chemotherapy. (Patient taking differently: Apply 1 application topically as needed (prior to chemotherapy.). ) 30 g 6  . loperamide (IMODIUM) 2 MG  capsule Take 2 mg by mouth 4 (four) times daily as needed for diarrhea or loose stools.     . Multiple Vitamin (MULTIVITAMIN WITH MINERALS) TABS tablet Take 1 tablet by mouth daily.    . ondansetron (ZOFRAN) 8 MG tablet One pill every 8 hours as needed for nausea/vomitting. (Patient taking differently: Take 8 mg by mouth every 8 (eight) hours as needed for nausea or vomiting. ) 60 tablet 0  . potassium chloride SA (K-DUR) 20 MEQ tablet Take 1 tablet (20 mEq total) by mouth 2 (two) times daily. 60 tablet 6  . prochlorperazine (COMPAZINE) 10 MG tablet Take 1 tablet (10 mg total) by mouth every 6 (six) hours as needed for nausea or vomiting. 90 tablet 3  . trifluridine-tipiracil (LONSURF) 20-8.19 MG tablet Take 2 tablets by mouth twice daily. Take 1 hr after AM & PM meals. Take on days 1-5, 8-12. Repeat every 28day 40 tablet 6   No current facility-administered medications for this visit.    Facility-Administered Medications Ordered in Other Visits  Medication Dose Route  Frequency Provider Last Rate Last Dose  . sodium chloride flush (NS) 0.9 % injection 10 mL  10 mL Intravenous PRN Cammie Sickle, MD   10 mL at 10/04/15 0901  . sodium chloride flush (NS) 0.9 % injection 10 mL  10 mL Intravenous PRN Cammie Sickle, MD   10 mL at 07/02/17 1020    PHYSICAL EXAMINATION: ECOG PERFORMANCE STATUS: 1 - Symptomatic but completely ambulatory  BP (!) 173/79 (BP Location: Left Arm, Patient Position: Sitting, Cuff Size: Normal)   Pulse 76   Temp 97.8 F (36.6 C) (Tympanic)   Resp 16   Wt 95 lb (43.1 kg)   LMP  (LMP Unknown) Comment: LMP MORE THAN 5 YRS  BMI 18.55 kg/m   Filed Weights   02/13/19 1532  Weight: 95 lb (43.1 kg)    Physical Exam  Constitutional: She is oriented to person, place, and time and well-developed, well-nourished, and in no distress.  She is alone. She is walking herself.  HENT:  Head: Normocephalic and atraumatic.  Mouth/Throat: Oropharynx is clear and moist. No oropharyngeal exudate.  Eyes: Pupils are equal, round, and reactive to light.  Neck: Normal range of motion. Neck supple.  Cardiovascular: Normal rate and regular rhythm.  Pulmonary/Chest: Effort normal and breath sounds normal. No respiratory distress. She has no wheezes.  Abdominal: Soft. Bowel sounds are normal. She exhibits no distension and no mass. There is no abdominal tenderness. There is no rebound and no guarding.  Musculoskeletal: Normal range of motion.        General: No tenderness or edema.  Neurological: She is alert and oriented to person, place, and time.  Skin: Skin is warm.  Psychiatric: Affect normal.       LABORATORY DATA:  I have reviewed the data as listed    Component Value Date/Time   NA 141 02/14/2019 0852   K 2.7 (LL) 02/14/2019 0852   CL 108 02/14/2019 0852   CO2 24 02/14/2019 0852   GLUCOSE 88 02/14/2019 0852   BUN 6 02/14/2019 0852   CREATININE 0.69 02/14/2019 0852   CALCIUM 8.0 (L) 02/14/2019 0852   PROT 5.8 (L)  02/14/2019 0852   ALBUMIN 3.1 (L) 02/14/2019 0852   AST 19 02/14/2019 0852   ALT 12 02/14/2019 0852   ALKPHOS 112 02/14/2019 0852   BILITOT 0.4 02/14/2019 0852   GFRNONAA >60 02/14/2019 0852   GFRAA >60 02/14/2019 1062  No results found for: SPEP, UPEP  Lab Results  Component Value Date   WBC 3.7 (L) 02/14/2019   NEUTROABS 2.4 02/14/2019   HGB 8.7 (L) 02/14/2019   HCT 27.8 (L) 02/14/2019   MCV 100.7 (H) 02/14/2019   PLT 212 02/14/2019      Chemistry      Component Value Date/Time   NA 141 02/14/2019 0852   K 2.7 (LL) 02/14/2019 0852   CL 108 02/14/2019 0852   CO2 24 02/14/2019 0852   BUN 6 02/14/2019 0852   CREATININE 0.69 02/14/2019 0852      Component Value Date/Time   CALCIUM 8.0 (L) 02/14/2019 0852   ALKPHOS 112 02/14/2019 0852   AST 19 02/14/2019 0852   ALT 12 02/14/2019 0852   BILITOT 0.4 02/14/2019 0852       RADIOGRAPHIC STUDIES: I have personally reviewed the radiological images as listed and agreed with the findings in the report. No results found.   ASSESSMENT & PLAN:  Cancer of ascending colon metastatic to intra-abdominal lymph node [facility]) # Colon cancer- cecal/ right-sided; STAGE IV; JUne 2020- MRI increasing lesion in liver ~3.5cm [see below].  Status post ablation of the liver lesion.  Most recent CEA up to 19 slightly rising.  Clinically stable  #Given the poor tolerance to Lonsurf status post 2 cycles; recommend holding further Lonsurf.  Recommend CT the abdomen pelvis to evaluate the disease status.  If improvement noted I would recommend holding off with therapy given the poor tolerance/chemo break.  However if progressive disease noted I would recommend subsequent end of therapy- ? Rego+nivo.   #Bilateral subcostal/abdominal discomfort slight improvement.  Question from hypokalemia.  Also await above CT scan.  #Severe hypokalemia- 2.7; sec to diarrhea/Lonsurf; recommend increasing ; Kdur to 20 TID.     #Nausea poor p.o. intake-secondary to  Lonsurf. recommend dexamethasone.   #Low albumin/suspect secondary to malnutrition- check urine protein.  # Peripheral neuropathy -stable  #Back pain stable/for malignancy.  Refilled fentanyl.  # DISPOSITION:  # Follow up in  2 weeks; MD/ labs- cbc/cmp/cea; CT ab/pelvis prior- Dr.B      Orders Placed This Encounter  Procedures  . CT ABDOMEN PELVIS W CONTRAST    Standing Status:   Future    Standing Expiration Date:   02/14/2020    Order Specific Question:   If indicated for the ordered procedure, I authorize the administration of contrast media per Radiology protocol    Answer:   Yes    Order Specific Question:   Preferred imaging location?    Answer:   [facility]    Order Specific Question:   Radiology Contrast Protocol - do NOT remove file path    Answer:   [email or web address]    Order Specific Question:   ** REASON FOR EXAM (FREE TEXT)    Answer:   colon cancer with liver metaastses    Order Specific Question:   Is patient pregnant?    Answer:   No  . Protein / creatinine ratio, urine    Standing Status:   Future    Number of Occurrences:   1    Standing Expiration Date:   02/14/2020   All questions were answered. The patient knows to call the clinic with any problems, questions or concerns.      Cammie Sickle, MD 02/14/2019 1:03 PM

## 2019-02-14 NOTE — Assessment & Plan Note (Addendum)
#   Colon cancer- cecal/ right-sided; STAGE IV; JUne 2020- MRI increasing lesion in liver ~3.5cm [see below].  Status post ablation of the liver lesion.  Most recent CEA up to 19 slightly rising.  Clinically stable  #Given the poor tolerance to Lonsurf status post 2 cycles; recommend holding further Lonsurf.  Recommend CT the abdomen pelvis to evaluate the disease status.  If improvement noted I would recommend holding off with therapy given the poor tolerance/chemo break.  However if progressive disease noted I would recommend subsequent end of therapy- ? Rego+nivo.   #Bilateral subcostal/abdominal discomfort slight improvement.  Question from hypokalemia.  Also await above CT scan.  #Severe hypokalemia- 2.7; sec to diarrhea/Lonsurf; recommend increasing ; Kdur to 20 TID.     #Nausea poor p.o. intake-secondary to Lonsurf. recommend dexamethasone.   #Low albumin/suspect secondary to malnutrition- check urine protein.  # Peripheral neuropathy -stable  #Back pain stable/for malignancy.  Refilled fentanyl.  # DISPOSITION:  # Follow up in  2 weeks; MD/ labs- cbc/cmp/cea; CT ab/pelvis prior- Dr.B

## 2019-02-15 LAB — CEA: CEA: 17.4 ng/mL — ABNORMAL HIGH (ref 0.0–4.7)

## 2019-02-18 ENCOUNTER — Other Ambulatory Visit: Payer: Self-pay | Admitting: Interventional Radiology

## 2019-02-18 DIAGNOSIS — R160 Hepatomegaly, not elsewhere classified: Secondary | ICD-10-CM

## 2019-02-20 ENCOUNTER — Telehealth: Payer: Self-pay

## 2019-02-20 NOTE — Telephone Encounter (Signed)
Nutrition Follow-up:  Patient with stage IV colon cancer with metastatic disease to L2 and liver.  Patient followed by Dr Rogue Bussing. Noted currently off lonsurf  Spoke with patient via phone this am.  Patient reports that appetite has improved.  She is trying to eat 3 meals per day including good sources of protein (eggs, meats, beans).  Reports that she is drinking 2 boost plus shakes per day.  Still has issues with loose stools and takes imodium.      Medications: marinol, KCL  Labs: reviewed  Anthropometrics:   Weight has increased to 95 lb on 9/25 from 89 lb on 7/6.    NUTRITION DIAGNOSIS: Inadequate oral intake improved   INTERVENTION:  Encouraged patient to continue to focus on high calorie, high protein foods to continue weight gain Encouraged oral nutrition supplements and will mail coupons.  Patient declined follow-up appointment with RD at this time.  Will reach out to RD if needed in the future. Contact information provided    MONITORING, EVALUATION, GOAL: Patient will consume adequate calories and protein to meet nutritional needs   NEXT VISIT: patient to reach out as needed  Freddy Spadafora B. Zenia Resides, [place], [place] Registered Dietitian [phone] (pager)

## 2019-02-24 ENCOUNTER — Other Ambulatory Visit: Payer: Self-pay

## 2019-02-24 ENCOUNTER — Ambulatory Visit
Admission: RE | Admit: 2019-02-24 | Discharge: 2019-02-24 | Disposition: A | Discharge status: Home / Self Care | Payer: BC Managed Care – PPO | Source: Ambulatory Visit | Attending: Internal Medicine | Admitting: Internal Medicine

## 2019-02-24 DIAGNOSIS — C772 Secondary and unspecified malignant neoplasm of intra-abdominal lymph nodes: Secondary | ICD-10-CM | POA: Insufficient documentation

## 2019-02-24 DIAGNOSIS — C182 Malignant neoplasm of ascending colon: Secondary | ICD-10-CM | POA: Insufficient documentation

## 2019-02-24 MED ORDER — IOHEXOL 300 MG/ML  SOLN
60.0000 mL | Freq: Once | INTRAMUSCULAR | Status: AC | PRN
  Administered 2019-02-24: 11:00:00 60 mL via INTRAVENOUS

## 2019-02-27 ENCOUNTER — Encounter: Payer: Self-pay | Admitting: *Deleted

## 2019-02-27 ENCOUNTER — Other Ambulatory Visit: Payer: Self-pay

## 2019-02-27 ENCOUNTER — Ambulatory Visit
Admission: RE | Admit: 2019-02-27 | Discharge: 2019-02-27 | Disposition: A | Discharge status: Home / Self Care | Payer: BC Managed Care – PPO | Source: Ambulatory Visit | Attending: Interventional Radiology | Admitting: Interventional Radiology

## 2019-02-27 ENCOUNTER — Telehealth: Payer: Self-pay | Admitting: Internal Medicine

## 2019-02-27 DIAGNOSIS — R160 Hepatomegaly, not elsewhere classified: Secondary | ICD-10-CM

## 2019-02-27 DIAGNOSIS — C182 Malignant neoplasm of ascending colon: Secondary | ICD-10-CM

## 2019-02-27 DIAGNOSIS — C772 Secondary and unspecified malignant neoplasm of intra-abdominal lymph nodes: Secondary | ICD-10-CM

## 2019-02-27 HISTORY — PX: IR RADIOLOGIST EVAL & MGMT: IMG5224

## 2019-02-27 NOTE — Progress Notes (Signed)
Chief Complaint: Metastatic Colon Cancer  Referring Physician(s): Dr. Rogue Bussing  History of Present Illness: Nancy Clark is a 58 y.o. female presenting today as a scheduled follow up to [facility], SP CT guided tissue ablation of right hepatic lobe colon CA met, performed 11/06/2018.   She joins [place] again today by telemedicine visit given the COVID situation.    Previous hx: She is a woman with a history of colon carcinoma diagnosed February of 2017, treated by Dr. Rogue Bussing.  We treated oligometastatic disease to the liver on November 06 2018 with microwave ablation of a right hepatic lobe lesion.    She continues to do well, and tells me that she enjoys getting out of the house to do some shopping and other activities, and completes her usual daily activities without problem.    On her last visit recently with Dr. Rogue Bussing, she has new onset back pain of about 1 month.  She has been taking her usual medications, with some relief.    She denies any other symptoms.    CT that was performed as her 3 month post ablation CT was completed 02/24/2019.  This shows excellent imaging result of the treated lesion of the right liver, with no concerning features.  There is evidence, however, of lymphatic progression in the hilum of the liver, with tissue surrounding the celiac artery.    Currently her CEA is 17.4.   She continues treatment with Dr. Rogue Bussing, and has upcoming appointment.   Past Medical History:  Diagnosis Date   Chicken pox    Colon cancer ([place])    Partial colectomy 07/2015 + chemo tx's   Hypertension    Hypokalemia    Menopause    > 5 yrs   Peripheral neuropathy due to chemotherapy ([place])    bi lat feet    Past Surgical History:  Procedure Laterality Date   BREAST BIOPSY Right    2007? pt unsure done by [facility]   COLONOSCOPY     COLONOSCOPY WITH PROPOFOL N/A 10/14/2015   Procedure: COLONOSCOPY WITH PROPOFOL;  Surgeon: Hulen Luster, MD;  Location:  [facility] ENDOSCOPY;  Service: Endoscopy;  Laterality: N/A;   COLOSTOMY REVISION Right 08/09/2015   Procedure: COLON RESECTION RIGHT;  Surgeon: Florene Glen, MD;  Location: [facility] ORS;  Service: General;  Laterality: Right;   ECTOPIC PREGNANCY SURGERY     IR BONE TUMOR(S)RF ABLATION  05/24/2018   IR KYPHO LUMBAR INC FX REDUCE BONE BX UNI/BIL CANNULATION INC/IMAGING  05/24/2018   IR RADIOLOGIST EVAL & MGMT  05/08/2018   IR RADIOLOGIST EVAL & MGMT  06/18/2018   IR RADIOLOGIST EVAL & MGMT  07/31/2018   IR RADIOLOGIST EVAL & MGMT  12/19/2018   PORTACATH PLACEMENT N/A 09/01/2015   Procedure: INSERTION PORT-A-CATH;  Surgeon: Florene Glen, MD;  Location: [facility] ORS;  Service: General;  Laterality: N/A;   RADIOLOGY WITH ANESTHESIA N/A 11/06/2018   Procedure: CT-MICROWAVE THERMAL ABLATION/LIVER;  Surgeon: Corrie Mckusick, DO;  Location: [facility] ORS;  Service: Anesthesiology;  Laterality: N/A;   SHOULDER SURGERY Left     Allergies: Patient has no known allergies.  Medications: Prior to Admission medications   Medication Sig Start Date End Date Taking? Authorizing Provider  acetaminophen (TYLENOL) 325 MG tablet Take 2 tablets (650 mg total) by mouth every 6 (six) hours as needed for mild pain (or Fever >/= 101). 10/14/15   Nicholes Mango, MD  Calcium Carbonate-Vitamin D (CALCIUM 600/VITAMIN D PO) Take 1 tablet by mouth  every evening.     [provider]  diphenoxylate-atropine (LOMOTIL) 2.5-0.025 MG tablet Take 1 tablet by mouth 4 (four) times daily as needed for diarrhea or loose stools. Take it along with immodium 09/09/18   Cammie Sickle, MD  dronabinol (MARINOL) 5 MG capsule Take 1 capsule (5 mg total) by mouth 2 (two) times daily before lunch and supper. 12/19/18   Verlon Au, NP  DULoxetine (CYMBALTA) 20 MG capsule Take 2 capsules (40 mg total) by mouth daily. Patient taking differently: Take 20 mg by mouth 2 (two) times daily.  06/10/18   Cammie Sickle, MD  feeding  supplement (BOOST HIGH PROTEIN) LIQD Take 1 Container by mouth daily.     [provider]  fentaNYL (DURAGESIC) 25 MCG/HR Place 1 patch onto the skin every 3 (three) days. 02/14/19   Cammie Sickle, MD  ferrous sulfate (FERROUSUL) 325 (65 FE) MG tablet Take 1 tablet (325 mg total) by mouth daily with breakfast. 10/14/15   Gouru, Illene Silver, MD  gabapentin (NEURONTIN) 300 MG capsule Take 1-2 capsules (300-600 mg total) by mouth See admin instructions. Take 1 capsule (300 mg) by mouth daily in the morning & take 2 capsules (600 mg) by mouth at night. 12/30/18   Cammie Sickle, MD  HYDROcodone-acetaminophen (NORCO/VICODIN) 5-325 MG tablet Take 1 tablet by mouth 2 (two) times daily as needed (pain.). 12/30/18   Cammie Sickle, MD  lidocaine-prilocaine (EMLA) cream Apply 1 application topically as needed. Apply generously over the Mediport 45 minutes prior to chemotherapy. Patient taking differently: Apply 1 application topically as needed (prior to chemotherapy.).  05/27/18   Cammie Sickle, MD  loperamide (IMODIUM) 2 MG capsule Take 2 mg by mouth 4 (four) times daily as needed for diarrhea or loose stools.     [provider]  Multiple Vitamin (MULTIVITAMIN WITH MINERALS) TABS tablet Take 1 tablet by mouth daily.    [provider]  ondansetron (ZOFRAN) 8 MG tablet One pill every 8 hours as needed for nausea/vomitting. Patient taking differently: Take 8 mg by mouth every 8 (eight) hours as needed for nausea or vomiting.  09/09/18   Cammie Sickle, MD  potassium chloride SA (K-DUR) 20 MEQ tablet Take 1 tablet (20 mEq total) by mouth 2 (two) times daily. 12/30/18   Cammie Sickle, MD  prochlorperazine (COMPAZINE) 10 MG tablet Take 1 tablet (10 mg total) by mouth every 6 (six) hours as needed for nausea or vomiting. 05/27/18   Cammie Sickle, MD  trifluridine-tipiracil (LONSURF) 20-8.19 MG tablet Take 2 tablets by mouth twice daily. Take 1 hr after  AM & PM meals. Take on days 1-5, 8-12. Repeat every 28day 11/14/18   Cammie Sickle, MD     Family History  Problem Relation Age of Onset   Hypertension Mother    Arthritis Father    Prostate cancer Father    Diabetes Maternal Grandmother    Colon cancer Maternal Grandfather        colon    Social History   Socioeconomic History   Marital status: Married    Spouse name: Not on file   Number of children: Not on file   Years of education: Not on file   Highest education level: Not on file  Occupational History   Not on file  Social Needs   Financial resource strain: Not on file   Food insecurity    Worry: Not on file    Inability: Not  on file   Transportation needs    Medical: Not on file    Non-medical: Not on file  Tobacco Use   Smoking status: Former Smoker    Packs/day: 0.25    Years: 2.00    Pack years: 0.50   Smokeless tobacco: Never Used   Tobacco comment: recently quit  Substance and Sexual Activity   Alcohol use: No    Alcohol/week: 0.0 standard drinks   Drug use: No   Sexual activity: Yes  Lifestyle   Physical activity    Days per week: Not on file    Minutes per session: Not on file   Stress: Not on file  Relationships   Social connections    Talks on phone: Not on file    Gets together: Not on file    Attends religious service: Not on file    Active member of club or organization: Not on file    Attends meetings of clubs or organizations: Not on file    Relationship status: Not on file  Other Topics Concern   Not on file  Social History Narrative   Not on file    ECOG Status: 1 - Symptomatic but completely ambulatory  Review of Systems  Review of Systems: A 12 point ROS discussed and pertinent positives are indicated in the HPI above.  All other systems are negative.  Physical Exam No direct physical exam was performed (except for noted visual exam findings with Video Visits).    Vital Signs: LMP  (LMP  Unknown) Comment: LMP MORE THAN 5 YRS  Imaging: Ct Abdomen Pelvis W Contrast  Result Date: 02/24/2019 CLINICAL DATA:  Patient with metastatic colon cancer. Follow-up exam. EXAM: CT ABDOMEN AND PELVIS WITH CONTRAST TECHNIQUE: Multidetector CT imaging of the abdomen and pelvis was performed using the standard protocol following bolus administration of intravenous contrast. CONTRAST:  25m OMNIPAQUE IOHEXOL 300 MG/ML  SOLN COMPARISON:  CT abdomen pelvis 11/01/2018 FINDINGS: Lower chest: Normal heart size. Dependent atelectasis left lower lobe. No pleural effusion. Hepatobiliary: Similar-appearing 2-4 mm low-attenuation lesions within the left and right hepatic lobes (image 10; series 2) (image 7; series 2). Fatty deposition adjacent to the falciform ligament. Redemonstrated post ablation cavity within the right hepatic lobe measuring 3.2 x 2.5 cm, previously 3.3 x 2.9 cm. Overall this is slightly decreased in size when compared to prior chest CT. Gallbladder is unremarkable. No intrahepatic or extrahepatic biliary ductal dilatation. Patchy heterogeneous low attenuation within the central liver left hepatic lobe may be perfusional in etiology. Pancreas: Unremarkable Spleen: Unremarkable Adrenals/Urinary Tract: Normal adrenal glands. Kidneys enhance symmetrically with contrast. Urinary bladder is unremarkable. Stomach/Bowel: Stool throughout the colon. Normal morphology of the stomach. No evidence for small bowel obstruction. Trace free fluid in the pelvis. No free intraperitoneal air. Vascular/Lymphatic: Tortuous yet normal caliber abdominal aorta with peripheral calcified atherosclerotic plaque. There is increased amorphous soft tissue within the central upper abdomen at the level of the celiac axis and superior mesenteric artery (image 16; series 2) measuring approximately 3.6 x 3.4 cm. There is soft tissue coursing along the celiac branches. There has been some residual soft tissue within this location suspected  to represent prior treated disease. Reproductive: Unremarkable Other: None. Musculoskeletal: Lower thoracic and lumbar spine degenerative changes. Similar-appearing patchy sclerotic lesion involving the L2 vertebral body status post kyphoplasty. IMPRESSION: 1. Interval increase in soft tissue within the hepatic hilum at the level of the celiac axis and superior mesenteric artery which may represent recurrent/metastatic disease. 2. Slight  interval decrease in size of post ablation cavity within the right hepatic lobe. 3. Similar-appearing sclerotic lesion within the L2 vertebral body status post kyphoplasty. 4. Interval development small volume ascites. Electronically Signed   By: Lovey Newcomer M.D.   On: 02/24/2019 12:01    Labs:  CBC: Recent Labs    12/30/18 1408 01/13/19 1433 01/29/19 0933 02/14/19 0852  WBC 2.5* 2.3* 2.1* 3.7*  HGB 10.4* 9.0* 9.0* 8.7*  HCT 33.3* 28.3* 28.6* 27.8*  PLT 202 68* 206 212    COAGS: Recent Labs    05/24/18 0955 11/04/18 0946  INR 1.11 1.0    BMP: Recent Labs    12/30/18 1408 01/13/19 1433 01/29/19 0933 02/14/19 0852  NA 142 140 143 141  K 3.3* 3.3* 2.5* 2.7*  CL 109 106 105 108  CO2 26 27 28 24   GLUCOSE 85 91 89 88  BUN 7 <5* <5* 6  CALCIUM 8.2* 7.5* 8.2* 8.0*  CREATININE 0.64 0.59 0.65 0.69  GFRNONAA >60 >60 >60 >60  GFRAA >60 >60 >60 >60    LIVER FUNCTION TESTS: Recent Labs    12/30/18 1408 01/13/19 1433 01/29/19 0933 02/14/19 0852  BILITOT 0.3 0.4 0.3 0.4  AST 14* 12* 19 19  ALT 10 7 14 12   ALKPHOS 60 53 86 112  PROT 5.7* 5.9* 6.2* 5.8*  ALBUMIN 3.0* 3.1* 3.5 3.1*    TUMOR MARKERS: No results for input(s): AFPTM, CEA, CA199, CHROMGRNA in the last 8760 hours.  Assessment and Plan:  Ms Dittmer is a 58 yo female with history of metastatic colon cancer, now status post CT guided ablation of oligometastatic liver disease (right liver), and of a lesion in the L1 level, treated with osteocool and vertebral augmentation.   She  has recovered completely from our liver treatment and the CT done 10/5 shows good imaging result of the lesion, essentially compete response (CR) by RECIST.    The CT does shows evidence of lymph progression at the hepatic hilum, which I did discuss with her briefly.  She has upcoming appointment with Dr. Rogue Bussing.  I also discussed her imaging with Dr. Rogue Bussing, and he will be seeing her soon.    Plan: - I have encouraged her to follow up with Dr. Rogue Bussing on schedule. - We will schedule a 6 month follow up with her, with a flexible expectation for imaging, as it seems her care is going to further intensify in the short future.    Electronically Signed: Corrie Mckusick 02/27/2019, 11:26 AM   I spent a total of    15 Minutes in remote  clinical consultation, greater than 50% of which was counseling/coordinating care for oligometastatic colon carcinoma, SP liver ablation.    Visit type: Audio only (telephone). Audio (no video) only due to no access to video. Alternative for in-person consultation at [facility], [address] [place], [place], [place]. This visit type was conducted due to national recommendations for restrictions regarding the COVID-19 Pandemic (e.g. social distancing).  This format is felt to be most appropriate for this patient at this time.  All issues noted in this document were discussed and addressed.

## 2019-02-27 NOTE — Telephone Encounter (Signed)
Spoke to patient regarding the results concerning for progressive mesenteric mass/noted on CT scan.  Recommend follow-up tomorrow as planned.  Also discussed with Dr. Damita Dunnings this morning-currently no interventional treatment options recommended.

## 2019-02-28 ENCOUNTER — Inpatient Hospital Stay: Payer: BC Managed Care – PPO | Attending: Internal Medicine | Admitting: *Deleted

## 2019-02-28 ENCOUNTER — Inpatient Hospital Stay (HOSPITAL_BASED_OUTPATIENT_CLINIC_OR_DEPARTMENT_OTHER): Payer: BC Managed Care – PPO | Admitting: Internal Medicine

## 2019-02-28 ENCOUNTER — Other Ambulatory Visit: Payer: BC Managed Care – PPO

## 2019-02-28 ENCOUNTER — Other Ambulatory Visit: Payer: Self-pay

## 2019-02-28 ENCOUNTER — Ambulatory Visit: Payer: BC Managed Care – PPO | Admitting: Internal Medicine

## 2019-02-28 ENCOUNTER — Encounter: Payer: Self-pay | Admitting: Internal Medicine

## 2019-02-28 DIAGNOSIS — R197 Diarrhea, unspecified: Secondary | ICD-10-CM | POA: Diagnosis not present

## 2019-02-28 DIAGNOSIS — Z79899 Other long term (current) drug therapy: Secondary | ICD-10-CM | POA: Insufficient documentation

## 2019-02-28 DIAGNOSIS — C182 Malignant neoplasm of ascending colon: Secondary | ICD-10-CM | POA: Diagnosis not present

## 2019-02-28 DIAGNOSIS — I10 Essential (primary) hypertension: Secondary | ICD-10-CM | POA: Diagnosis not present

## 2019-02-28 DIAGNOSIS — G893 Neoplasm related pain (acute) (chronic): Secondary | ICD-10-CM | POA: Insufficient documentation

## 2019-02-28 DIAGNOSIS — C772 Secondary and unspecified malignant neoplasm of intra-abdominal lymph nodes: Secondary | ICD-10-CM

## 2019-02-28 DIAGNOSIS — E876 Hypokalemia: Secondary | ICD-10-CM | POA: Diagnosis not present

## 2019-02-28 DIAGNOSIS — D649 Anemia, unspecified: Secondary | ICD-10-CM | POA: Insufficient documentation

## 2019-02-28 DIAGNOSIS — Z23 Encounter for immunization: Secondary | ICD-10-CM | POA: Diagnosis not present

## 2019-02-28 DIAGNOSIS — Z66 Do not resuscitate: Secondary | ICD-10-CM | POA: Insufficient documentation

## 2019-02-28 DIAGNOSIS — Z5111 Encounter for antineoplastic chemotherapy: Secondary | ICD-10-CM | POA: Diagnosis present

## 2019-02-28 DIAGNOSIS — Z5112 Encounter for antineoplastic immunotherapy: Secondary | ICD-10-CM | POA: Insufficient documentation

## 2019-02-28 DIAGNOSIS — C180 Malignant neoplasm of cecum: Secondary | ICD-10-CM | POA: Diagnosis not present

## 2019-02-28 DIAGNOSIS — Z95828 Presence of other vascular implants and grafts: Secondary | ICD-10-CM

## 2019-02-28 DIAGNOSIS — G629 Polyneuropathy, unspecified: Secondary | ICD-10-CM | POA: Insufficient documentation

## 2019-02-28 LAB — CBC WITH DIFFERENTIAL/PLATELET
Abs Immature Granulocytes: 0.03 10*3/uL (ref 0.00–0.07)
Basophils Absolute: 0 10*3/uL (ref 0.0–0.1)
Basophils Relative: 1 %
Eosinophils Absolute: 0.1 10*3/uL (ref 0.0–0.5)
Eosinophils Relative: 2 %
HCT: 30 % — ABNORMAL LOW (ref 36.0–46.0)
Hemoglobin: 9.5 g/dL — ABNORMAL LOW (ref 12.0–15.0)
Immature Granulocytes: 1 %
Lymphocytes Relative: 19 %
Lymphs Abs: 1 10*3/uL (ref 0.7–4.0)
MCH: 30.8 pg (ref 26.0–34.0)
MCHC: 31.7 g/dL (ref 30.0–36.0)
MCV: 97.4 fL (ref 80.0–100.0)
Monocytes Absolute: 0.6 10*3/uL (ref 0.1–1.0)
Monocytes Relative: 12 %
Neutro Abs: 3.4 10*3/uL (ref 1.7–7.7)
Neutrophils Relative %: 65 %
Platelets: 173 10*3/uL (ref 150–400)
RBC: 3.08 MIL/uL — ABNORMAL LOW (ref 3.87–5.11)
RDW: 20.5 % — ABNORMAL HIGH (ref 11.5–15.5)
WBC: 5.2 10*3/uL (ref 4.0–10.5)
nRBC: 0 % (ref 0.0–0.2)

## 2019-02-28 LAB — COMPREHENSIVE METABOLIC PANEL
ALT: 10 U/L (ref 0–44)
AST: 17 U/L (ref 15–41)
Albumin: 3.3 g/dL — ABNORMAL LOW (ref 3.5–5.0)
Alkaline Phosphatase: 108 U/L (ref 38–126)
Anion gap: 8 (ref 5–15)
BUN: 9 mg/dL (ref 6–20)
CO2: 23 mmol/L (ref 22–32)
Calcium: 8.6 mg/dL — ABNORMAL LOW (ref 8.9–10.3)
Chloride: 109 mmol/L (ref 98–111)
Creatinine, Ser: 0.66 mg/dL (ref 0.44–1.00)
GFR calc Af Amer: >60 mL/min (ref >60)
GFR calc non Af Amer: >60 mL/min (ref >60)
Glucose, Bld: 99 mg/dL (ref 70–99)
Potassium: 4.1 mmol/L (ref 3.5–5.1)
Sodium: 140 mmol/L (ref 135–145)
Total Bilirubin: 0.3 mg/dL (ref 0.3–1.2)
Total Protein: 6.4 g/dL — ABNORMAL LOW (ref 6.5–8.1)

## 2019-02-28 MED ORDER — HEPARIN SOD (PORK) LOCK FLUSH 100 UNIT/ML IV SOLN
500.0000 [IU] | Freq: Once | INTRAVENOUS | Status: AC
  Administered 2019-02-28: 500 [IU] via INTRAVENOUS

## 2019-02-28 MED ORDER — HYDROCODONE-ACETAMINOPHEN 5-325 MG PO TABS: 1.0000 | ORAL_TABLET | Freq: Three times a day (TID) | ORAL | 0 refills | Status: DC | PRN

## 2019-02-28 MED ORDER — SODIUM CHLORIDE 0.9% FLUSH
10.0000 mL | Freq: Once | INTRAVENOUS | Status: AC
  Administered 2019-02-28: 10 mL via INTRAVENOUS
  Filled 2019-02-28: qty 10

## 2019-02-28 NOTE — Progress Notes (Signed)
Pt in for follow up, nurse called and did pre assessment yesterday. Pt denies any changes.

## 2019-02-28 NOTE — Assessment & Plan Note (Addendum)
#   Colon cancer- cecal/ right-sided; STAGE IV; October 2 CT scan stable liver lesions post ablation; however approximately 3 cm mesenteric lesion noted/concern for progressive disease.    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  Discussed treatments are palliative not curative.  #Ongoing diarrhea-1-2 loose stools every day.  Chronic.  Unclear etiology.  Hold off colonoscopy at this time.  Continue antidiarrheals.  # Abdominal discomfort/epigastric/ back pain - sec to malignancy; fentany/ hydocodone- increase to TID. New script given.   # Severe hypokalemia- 2.7; sec to diarrhea/  Kdur to 20 TID.     # Peripheral neuropathy -stable  # DISPOSITION: # follow up to be decided

## 2019-02-28 NOTE — Progress Notes (Signed)
[facility] OFFICE PROGRESS NOTE  Patient Care Team: Patient, No Pcp Per as PCP - General (General Practice)  Cancer Staging No matching staging information was found for the patient.   Oncology History Overview Note  # [facility] 2017- COLON CANCER- STAGE IV [s/p right hemi-colectomy; pT4apN2 (7/19LN)]; Pre-op CEA- [id number]  PET-  mesenteric adenopathy/mediastinal adenopathy/right-sided subclavicular lymph node.    April 17th -START FOLFOX; May 1st Add Avastin;   Aug 16th- Disc OX [sec PN]; con 5FU-Avastin; NOV 27th CT C/A/P- CR; except for 69m LUL; 5FU-Avastin q 3 W [Poor tolerance to FOLFOX].  # June/July 2019- Progression of Liver/Abd LN; July 2019- START FOLFIRI + Avastin; STOP iri 02/25/2018- sec to diarrhea; cont Avastin + 5FU  # March 2018- L2 uptake MET s/p RT [[facility]2018]; mid April X-geva  # L1 ablation/ostecool-  Jan 3rd 2020-   # march 2nd 2020- decrease 5FU CIV  to 20024mm2/  #June 17th, 2020-liver ablation; 3.5 cm lesion.  Intolerance to 5-FU plus Avastin.  Discontinued; Side effects/weight loss/ quality of life  #July 13th -Lonsurf cycle #1  # July 2nd CT scan-right laryngeal nerve palsy/hoarseness of voice [Dr. McQueen] CT-chest NEG; MRI brain negative.   # MOLECULAR TESTING: K-ras-EXON 2- MUTATED; MSI- STABLE.   DIAGNOSIS: colon ca  STAGE:  IV ;GOALS: pallaitive  CURRENT/MOST RECENT THERAPY:Lonsurf    Cancer of ascending colon metastatic to intra-abdominal lymph node (HCC)    INTERVAL HISTORY:  [name].  female pleasant patient above history of metastatic adenocarcinoma of the colon-most recently on Lonsurf status post 2 cycles; third cycle on hold because of poor tolerance.  Patient is here to review the results of the CT scan.  In the interim patient was evaluated by interventional radiology given her liver ablation; telephone visit.  Patient continues to have 1-2 loose stools every other day.  Complains of poor  appetite.  Complains of significant fatigue.   No fevers or chills.  Patient continues to complain of abdominal pain/back pain-continue the fentanyl patch and also on hydrocodone twice a day.  States her pain is not adequately controlled.  Review of Systems  Constitutional: Positive for malaise/fatigue. Negative for chills, diaphoresis and fever.  HENT: Negative for nosebleeds and sore throat.   Eyes: Negative for double vision.  Respiratory: Negative for cough, hemoptysis, sputum production, shortness of breath and wheezing.   Cardiovascular: Negative for chest pain, palpitations, orthopnea and leg swelling.  Gastrointestinal: Positive for abdominal pain and nausea. Negative for constipation, heartburn, melena and vomiting.  Genitourinary: Negative for dysuria, frequency and urgency.  Musculoskeletal: Positive for back pain. Negative for joint pain.  Skin: Negative.  Negative for itching and rash.  Neurological: Positive for tingling. Negative for dizziness, focal weakness, weakness and headaches.  Endo/Heme/Allergies: Does not bruise/bleed easily.  Psychiatric/Behavioral: Negative for depression. The patient is not nervous/anxious and does not have insomnia.       PAST MEDICAL HISTORY :  Past Medical History:  Diagnosis Date  . Chicken pox   . Colon cancer ([facility]   Partial colectomy 07/2015 + chemo tx's  . Hypertension   . Hypokalemia   . Menopause    > 5 yrs  . Peripheral neuropathy due to chemotherapy ([facility]   bi lat feet    PAST SURGICAL HISTORY :   Past Surgical History:  Procedure Laterality Date  . BREAST BIOPSY Right    2007? pt unsure done by [facility]  . COLONOSCOPY    .  COLONOSCOPY WITH PROPOFOL N/A 10/14/2015   Procedure: COLONOSCOPY WITH PROPOFOL;  Surgeon: Hulen Luster, MD;  Location: [facility] ENDOSCOPY;  Service: Endoscopy;  Laterality: N/A;  . COLOSTOMY REVISION Right 08/09/2015   Procedure: COLON RESECTION RIGHT;  Surgeon: Florene Glen, MD;  Location: [facility] ORS;   Service: General;  Laterality: Right;  . ECTOPIC PREGNANCY SURGERY    . IR BONE TUMOR(S)RF ABLATION  05/24/2018  . IR KYPHO LUMBAR INC FX REDUCE BONE BX UNI/BIL CANNULATION INC/IMAGING  05/24/2018  . IR RADIOLOGIST EVAL & MGMT  05/08/2018  . IR RADIOLOGIST EVAL & MGMT  06/18/2018  . IR RADIOLOGIST EVAL & MGMT  07/31/2018  . IR RADIOLOGIST EVAL & MGMT  12/19/2018  . IR RADIOLOGIST EVAL & MGMT  02/27/2019  . PORTACATH PLACEMENT N/A 09/01/2015   Procedure: INSERTION PORT-A-CATH;  Surgeon: Florene Glen, MD;  Location: [facility] ORS;  Service: General;  Laterality: N/A;  . RADIOLOGY WITH ANESTHESIA N/A 11/06/2018   Procedure: CT-MICROWAVE THERMAL ABLATION/LIVER;  Surgeon: Corrie Mckusick, DO;  Location: [facility] ORS;  Service: Anesthesiology;  Laterality: N/A;  . SHOULDER SURGERY Left     FAMILY HISTORY :   Family History  Problem Relation Age of Onset  . Hypertension Mother   . Arthritis Father   . Prostate cancer Father   . Diabetes Maternal Grandmother   . Colon cancer Maternal Grandfather        colon    SOCIAL HISTORY:   Social History   Tobacco Use  . Smoking status: Former Smoker    Packs/day: 0.25    Years: 2.00    Pack years: 0.50  . Smokeless tobacco: Never Used  . Tobacco comment: recently quit  Substance Use Topics  . Alcohol use: No    Alcohol/week: 0.0 standard drinks  . Drug use: No    ALLERGIES:  has No Known Allergies.  MEDICATIONS:  Current Outpatient Medications  Medication Sig Dispense Refill  . acetaminophen (TYLENOL) 325 MG tablet Take 2 tablets (650 mg total) by mouth every 6 (six) hours as needed for mild pain (or Fever >/= 101).    . Calcium Carbonate-Vitamin D (CALCIUM 600/VITAMIN D PO) Take 1 tablet by mouth every evening.     . diphenoxylate-atropine (LOMOTIL) 2.5-0.025 MG tablet Take 1 tablet by mouth 4 (four) times daily as needed for diarrhea or loose stools. Take it along with immodium 40 tablet 4  . dronabinol (MARINOL) 5 MG capsule Take 1 capsule (5 mg  total) by mouth 2 (two) times daily before lunch and supper. 60 capsule 4  . DULoxetine (CYMBALTA) 20 MG capsule Take 2 capsules (40 mg total) by mouth daily. (Patient taking differently: Take 20 mg by mouth 2 (two) times daily. ) 180 capsule 3  . feeding supplement (BOOST HIGH PROTEIN) LIQD Take 1 Container by mouth daily.     . fentaNYL (DURAGESIC) 25 MCG/HR Place 1 patch onto the skin every 3 (three) days. 10 patch 0  . ferrous sulfate (FERROUSUL) 325 (65 FE) MG tablet Take 1 tablet (325 mg total) by mouth daily with breakfast. 90 tablet 3  . gabapentin (NEURONTIN) 300 MG capsule Take 1-2 capsules (300-600 mg total) by mouth See admin instructions. Take 1 capsule (300 mg) by mouth daily in the morning & take 2 capsules (600 mg) by mouth at night. 90 capsule 6  . HYDROcodone-acetaminophen (NORCO/VICODIN) 5-325 MG tablet Take 1 tablet by mouth every 8 (eight) hours as needed (pain.). 90 tablet 0  . lidocaine-prilocaine (EMLA) cream  Apply 1 application topically as needed. Apply generously over the Mediport 45 minutes prior to chemotherapy. (Patient taking differently: Apply 1 application topically as needed (prior to chemotherapy.). ) 30 g 6  . loperamide (IMODIUM) 2 MG capsule Take 2 mg by mouth 4 (four) times daily as needed for diarrhea or loose stools.     . Multiple Vitamin (MULTIVITAMIN WITH MINERALS) TABS tablet Take 1 tablet by mouth daily.    . ondansetron (ZOFRAN) 8 MG tablet One pill every 8 hours as needed for nausea/vomitting. (Patient taking differently: Take 8 mg by mouth every 8 (eight) hours as needed for nausea or vomiting. ) 60 tablet 0  . potassium chloride SA (K-DUR) 20 MEQ tablet Take 1 tablet (20 mEq total) by mouth 2 (two) times daily. 60 tablet 6  . prochlorperazine (COMPAZINE) 10 MG tablet Take 1 tablet (10 mg total) by mouth every 6 (six) hours as needed for nausea or vomiting. 90 tablet 3  . trifluridine-tipiracil (LONSURF) 20-8.19 MG tablet Take 2 tablets by mouth twice  daily. Take 1 hr after AM & PM meals. Take on days 1-5, 8-12. Repeat every 28day 40 tablet 6   No current facility-administered medications for this visit.    Facility-Administered Medications Ordered in Other Visits  Medication Dose Route Frequency Provider Last Rate Last Dose  . sodium chloride flush (NS) 0.9 % injection 10 mL  10 mL Intravenous PRN Cammie Sickle, MD   10 mL at 10/04/15 0901  . sodium chloride flush (NS) 0.9 % injection 10 mL  10 mL Intravenous PRN Cammie Sickle, MD   10 mL at 07/02/17 1020    PHYSICAL EXAMINATION: ECOG PERFORMANCE STATUS: 1 - Symptomatic but completely ambulatory  BP (!) 173/94 (BP Location: Left Arm, Patient Position: Sitting)   Pulse 77   Temp 97.9 F (36.6 C) (Tympanic)   Resp 16   Wt 89 lb (40.4 kg)   LMP  (LMP Unknown) Comment: LMP MORE THAN 5 YRS  BMI 17.38 kg/m   Filed Weights   02/28/19 0949  Weight: 89 lb (40.4 kg)    Physical Exam  Constitutional: She is oriented to person, place, and time and well-developed, well-nourished, and in no distress.  She is alone. She is walking herself.  HENT:  Head: Normocephalic and atraumatic.  Mouth/Throat: Oropharynx is clear and moist. No oropharyngeal exudate.  Eyes: Pupils are equal, round, and reactive to light.  Neck: Normal range of motion. Neck supple.  Cardiovascular: Normal rate and regular rhythm.  Pulmonary/Chest: Effort normal and breath sounds normal. No respiratory distress. She has no wheezes.  Abdominal: Soft. Bowel sounds are normal. She exhibits no distension and no mass. There is no abdominal tenderness. There is no rebound and no guarding.  Musculoskeletal: Normal range of motion.        General: No tenderness or edema.  Neurological: She is alert and oriented to person, place, and time.  Skin: Skin is warm.  Psychiatric: Affect normal.   LABORATORY DATA:  I have reviewed the data as listed    Component Value Date/Time   NA 140 02/28/2019 0931   K 4.1  02/28/2019 0931   CL 109 02/28/2019 0931   CO2 23 02/28/2019 0931   GLUCOSE 99 02/28/2019 0931   BUN 9 02/28/2019 0931   CREATININE 0.66 02/28/2019 0931   CALCIUM 8.6 (L) 02/28/2019 0931   PROT 6.4 (L) 02/28/2019 0931   ALBUMIN 3.3 (L) 02/28/2019 0931   AST 17 02/28/2019 0931  ALT 10 02/28/2019 0931   ALKPHOS 108 02/28/2019 0931   BILITOT 0.3 02/28/2019 0931   GFRNONAA >60 02/28/2019 0931   GFRAA >60 02/28/2019 0931    No results found for: SPEP, UPEP  Lab Results  Component Value Date   WBC 5.2 02/28/2019   NEUTROABS 3.4 02/28/2019   HGB 9.5 (L) 02/28/2019   HCT 30.0 (L) 02/28/2019   MCV 97.4 02/28/2019   PLT 173 02/28/2019      Chemistry      Component Value Date/Time   NA 140 02/28/2019 0931   K 4.1 02/28/2019 0931   CL 109 02/28/2019 0931   CO2 23 02/28/2019 0931   BUN 9 02/28/2019 0931   CREATININE 0.66 02/28/2019 0931      Component Value Date/Time   CALCIUM 8.6 (L) 02/28/2019 0931   ALKPHOS 108 02/28/2019 0931   AST 17 02/28/2019 0931   ALT 10 02/28/2019 0931   BILITOT 0.3 02/28/2019 0931       RADIOGRAPHIC STUDIES: I have personally reviewed the radiological images as listed and agreed with the findings in the report. Ir Radiologist Eval & Mgmt  Result Date: 02/27/2019 Please refer to notes tab for details about interventional procedure. (Op Note)    ASSESSMENT & PLAN:  Cancer of ascending colon metastatic to intra-abdominal lymph node [facility]) # Colon cancer- cecal/ right-sided; STAGE IV; October 2 CT scan stable liver lesions post ablation; however approximately 3 cm mesenteric lesion noted/concern for progressive disease.    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  Discussed treatments are palliative not curative.  #Ongoing diarrhea-1-2 loose stools every day.  Chronic.  Unclear etiology.  Hold off colonoscopy at this time.  Continue  antidiarrheals.  # Abdominal discomfort/epigastric/ back pain - sec to malignancy; fentany/ hydocodone- increase to TID. New script given.   # Severe hypokalemia- 2.7; sec to diarrhea/  Kdur to 20 TID.     # Peripheral neuropathy -stable  # DISPOSITION: # follow up to be decided       No orders of the defined types were placed in this encounter.  All questions were answered. The patient knows to call the clinic with any problems, questions or concerns.      Cammie Sickle, MD 02/28/2019 5:55 PM

## 2019-03-03 ENCOUNTER — Inpatient Hospital Stay: Payer: BC Managed Care – PPO

## 2019-03-03 ENCOUNTER — Telehealth: Payer: Self-pay | Admitting: Internal Medicine

## 2019-03-03 ENCOUNTER — Emergency Department
Admission: EM | Admit: 2019-03-03 | Discharge: 2019-03-03 | Disposition: A | Discharge status: Home / Self Care | Payer: BC Managed Care – PPO | Attending: Emergency Medicine | Admitting: Emergency Medicine

## 2019-03-03 ENCOUNTER — Other Ambulatory Visit: Payer: Self-pay

## 2019-03-03 ENCOUNTER — Emergency Department: Payer: BC Managed Care – PPO

## 2019-03-03 ENCOUNTER — Inpatient Hospital Stay: Payer: BC Managed Care – PPO | Admitting: Internal Medicine

## 2019-03-03 ENCOUNTER — Inpatient Hospital Stay: Payer: BC Managed Care – PPO | Admitting: Nurse Practitioner

## 2019-03-03 DIAGNOSIS — I10 Essential (primary) hypertension: Secondary | ICD-10-CM | POA: Diagnosis not present

## 2019-03-03 DIAGNOSIS — C182 Malignant neoplasm of ascending colon: Secondary | ICD-10-CM

## 2019-03-03 DIAGNOSIS — G894 Chronic pain syndrome: Secondary | ICD-10-CM | POA: Insufficient documentation

## 2019-03-03 DIAGNOSIS — Z85038 Personal history of other malignant neoplasm of large intestine: Secondary | ICD-10-CM | POA: Diagnosis not present

## 2019-03-03 DIAGNOSIS — Z79899 Other long term (current) drug therapy: Secondary | ICD-10-CM | POA: Diagnosis not present

## 2019-03-03 DIAGNOSIS — Z87891 Personal history of nicotine dependence: Secondary | ICD-10-CM | POA: Insufficient documentation

## 2019-03-03 LAB — HEPATIC FUNCTION PANEL
ALT: 11 U/L (ref 0–44)
AST: 17 U/L (ref 15–41)
Albumin: 4 g/dL (ref 3.5–5.0)
Alkaline Phosphatase: 115 U/L (ref 38–126)
Bilirubin, Direct: <0.1 mg/dL (ref 0.0–0.2)
Total Bilirubin: 0.7 mg/dL (ref 0.3–1.2)
Total Protein: 7.5 g/dL (ref 6.5–8.1)

## 2019-03-03 LAB — CBC
HCT: 37.6 % (ref 36.0–46.0)
Hemoglobin: 11.9 g/dL — ABNORMAL LOW (ref 12.0–15.0)
MCH: 30.2 pg (ref 26.0–34.0)
MCHC: 31.6 g/dL (ref 30.0–36.0)
MCV: 95.4 fL (ref 80.0–100.0)
Platelets: 253 10*3/uL (ref 150–400)
RBC: 3.94 MIL/uL (ref 3.87–5.11)
RDW: 19.7 % — ABNORMAL HIGH (ref 11.5–15.5)
WBC: 6 10*3/uL (ref 4.0–10.5)
nRBC: 0 % (ref 0.0–0.2)

## 2019-03-03 LAB — BASIC METABOLIC PANEL
Anion gap: 10 (ref 5–15)
BUN: 9 mg/dL (ref 6–20)
CO2: 25 mmol/L (ref 22–32)
Calcium: 9.2 mg/dL (ref 8.9–10.3)
Chloride: 104 mmol/L (ref 98–111)
Creatinine, Ser: 0.67 mg/dL (ref 0.44–1.00)
GFR calc Af Amer: >60 mL/min (ref >60)
GFR calc non Af Amer: >60 mL/min (ref >60)
Glucose, Bld: 103 mg/dL — ABNORMAL HIGH (ref 70–99)
Potassium: 3.6 mmol/L (ref 3.5–5.1)
Sodium: 139 mmol/L (ref 135–145)

## 2019-03-03 LAB — LIPASE, BLOOD: Lipase: <10 U/L — ABNORMAL LOW (ref 11–51)

## 2019-03-03 MED ORDER — OXYCODONE-ACETAMINOPHEN 5-325 MG PO TABS: 1.0000 | ORAL_TABLET | Freq: Four times a day (QID) | ORAL | 0 refills | Status: AC | PRN

## 2019-03-03 MED ORDER — AMLODIPINE BESYLATE 5 MG PO TABS
5.0000 mg | ORAL_TABLET | Freq: Once | ORAL | Status: AC
  Administered 2019-03-03: 5 mg via ORAL
  Filled 2019-03-03: qty 1

## 2019-03-03 MED ORDER — HYDROMORPHONE HCL 1 MG/ML IJ SOLN
1.0000 mg | Freq: Once | INTRAMUSCULAR | Status: AC
  Administered 2019-03-03: 1 mg via INTRAVENOUS
  Filled 2019-03-03: qty 1

## 2019-03-03 MED ORDER — LABETALOL HCL 5 MG/ML IV SOLN
10.0000 mg | Freq: Once | INTRAVENOUS | Status: AC
  Administered 2019-03-03: 15:00:00 10 mg via INTRAVENOUS
  Filled 2019-03-03: qty 4

## 2019-03-03 MED ORDER — LABETALOL HCL 5 MG/ML IV SOLN
10.0000 mg | Freq: Once | INTRAVENOUS | Status: AC
  Administered 2019-03-03: 10 mg via INTRAVENOUS
  Filled 2019-03-03: qty 4

## 2019-03-03 MED ORDER — AMLODIPINE BESYLATE 5 MG PO TABS: 5.0000 mg | ORAL_TABLET | Freq: Every day | ORAL | 1 refills | Status: DC

## 2019-03-03 NOTE — ED Notes (Signed)
Resumed care from [facility].  Pt alert  Pt in hallway bed  Family with pt.  Pt has a headache.  meds given.

## 2019-03-03 NOTE — Telephone Encounter (Signed)
Spoke to patient after follow-up with the ER today.  Doing better.  Continue Norvasc.  Follow-up/chemotherapy planned on October 14.  Patient in agreement.

## 2019-03-03 NOTE — ED Triage Notes (Addendum)
Pt comes via POV from home with c/o back pain and hypertension.  Pt states the back pain has been hurting for awhile and has gotten worse since her BP was high.  Pt states she was taking BP medication but PCP took her off bc her BP was running too low.  Pt states headache. Pt denies any blurred vision. Pt denies any CP or SOB. Current BP is 200/89.

## 2019-03-03 NOTE — ED Notes (Signed)
Pt states she is feeling better blood pressure improved   Family with pt.

## 2019-03-03 NOTE — Telephone Encounter (Signed)
Spoke to Dr. Joni Fears ER; patient symptoms improved post pain medication/Dilaudid; blood pressure improved-labetalol.  Recommend Norvasc 5 mg once a day at discharge  #Chemotherapy is approved; will plan to start 5-FU-Avastin; 10/14.   # C- MD; lab- [facility]A- 5FU-Avastin-Dr.B

## 2019-03-03 NOTE — ED Provider Notes (Signed)
[facility] Emergency Department Provider Note  ____________________________________________  Time seen: Approximately 4:29 PM  I have reviewed the triage vital signs and the nursing notes.   HISTORY  Chief Complaint Hypertension    HPI Nancy Clark is a 58 y.o. female with a history of stage IV colon cancer status post resection and chemotherapy, hypertension, peripheral neuropathy, chronic abdominal and back pain  who comes the ED complaining of worsening of her chronic back pain for the last 2 days, gradual onset, no aggravating or alleviating factors.  Not controlled by her usual pain management regimen of fentanyl patch and as needed hydrocodone.  This is associated with markedly elevated blood pressure and some bilateral frontal headache as well.  Denies vision changes, paresthesias weakness or syncope.  No thunderclap headaches.  Pain is nonradiating, severe.  No vomiting diarrhea or constipation.  No fevers chills body aches or sweats  Patient had CT scan done by her doctor about a week ago which shows some interval increase in the size of a soft tissue mass in the area of the celiac axis consistent with likely recurrent/progressive neoplastic disease.  No evidence of hemodynamically significant arterial stenosis or occlusion or ischemic tissue.   Past Medical History:  Diagnosis Date  . Chicken pox   . Colon cancer ([place])    Partial colectomy 07/2015 + chemo tx's  . Hypertension   . Hypokalemia   . Menopause    > 5 yrs  . Peripheral neuropathy due to chemotherapy ([place])    bi lat feet     Patient Active Problem List   Diagnosis Date Noted  . Liver mass, right lobe 11/06/2018  . Goals of care, counseling/discussion 10/19/2018  . Cancer, metastatic to bone ([place]) 07/31/2016  . Encounter for antineoplastic chemotherapy 12/07/2015  . Malnutrition of moderate degree 10/13/2015  . Gastrointestinal bleeding 10/12/2015  . Pancytopenia due to  chemotherapy ([place]) 10/12/2015  . Cancer of ascending colon metastatic to intra-abdominal lymph node ([place]) 09/06/2015  . Preventative health care 06/08/2015     Past Surgical History:  Procedure Laterality Date  . BREAST BIOPSY Right    2007? pt unsure done by [facility]  . COLONOSCOPY    . COLONOSCOPY WITH PROPOFOL N/A 10/14/2015   Procedure: COLONOSCOPY WITH PROPOFOL;  Surgeon: Hulen Luster, MD;  Location: [facility] ENDOSCOPY;  Service: Endoscopy;  Laterality: N/A;  . COLOSTOMY REVISION Right 08/09/2015   Procedure: COLON RESECTION RIGHT;  Surgeon: Florene Glen, MD;  Location: [facility] ORS;  Service: General;  Laterality: Right;  . ECTOPIC PREGNANCY SURGERY    . IR BONE TUMOR(S)RF ABLATION  05/24/2018  . IR KYPHO LUMBAR INC FX REDUCE BONE BX UNI/BIL CANNULATION INC/IMAGING  05/24/2018  . IR RADIOLOGIST EVAL & MGMT  05/08/2018  . IR RADIOLOGIST EVAL & MGMT  06/18/2018  . IR RADIOLOGIST EVAL & MGMT  07/31/2018  . IR RADIOLOGIST EVAL & MGMT  12/19/2018  . IR RADIOLOGIST EVAL & MGMT  02/27/2019  . PORTACATH PLACEMENT N/A 09/01/2015   Procedure: INSERTION PORT-A-CATH;  Surgeon: Florene Glen, MD;  Location: [facility] ORS;  Service: General;  Laterality: N/A;  . RADIOLOGY WITH ANESTHESIA N/A 11/06/2018   Procedure: CT-MICROWAVE THERMAL ABLATION/LIVER;  Surgeon: Corrie Mckusick, DO;  Location: [facility] ORS;  Service: Anesthesiology;  Laterality: N/A;  . SHOULDER SURGERY Left      Prior to Admission medications   Medication Sig Start Date End Date Taking? Authorizing Provider  acetaminophen (TYLENOL) 325 MG tablet Take 2 tablets (650  mg total) by mouth every 6 (six) hours as needed for mild pain (or Fever >/= 101). 10/14/15   Gouru, Illene Silver, MD  amLODipine (NORVASC) 5 MG tablet Take 1 tablet (5 mg total) by mouth daily. 03/03/19   Carrie Mew, MD  Calcium Carbonate-Vitamin D (CALCIUM 600/VITAMIN D PO) Take 1 tablet by mouth every evening.     [provider]  diphenoxylate-atropine (LOMOTIL) 2.5-0.025 MG tablet  Take 1 tablet by mouth 4 (four) times daily as needed for diarrhea or loose stools. Take it along with immodium 09/09/18   Cammie Sickle, MD  dronabinol (MARINOL) 5 MG capsule Take 1 capsule (5 mg total) by mouth 2 (two) times daily before lunch and supper. 12/19/18   Verlon Au, NP  DULoxetine (CYMBALTA) 20 MG capsule Take 2 capsules (40 mg total) by mouth daily. Patient taking differently: Take 20 mg by mouth 2 (two) times daily.  06/10/18   Cammie Sickle, MD  feeding supplement (BOOST HIGH PROTEIN) LIQD Take 1 Container by mouth daily.     [provider]  fentaNYL (DURAGESIC) 25 MCG/HR Place 1 patch onto the skin every 3 (three) days. 02/14/19   Cammie Sickle, MD  ferrous sulfate (FERROUSUL) 325 (65 FE) MG tablet Take 1 tablet (325 mg total) by mouth daily with breakfast. 10/14/15   Gouru, Illene Silver, MD  gabapentin (NEURONTIN) 300 MG capsule Take 1-2 capsules (300-600 mg total) by mouth See admin instructions. Take 1 capsule (300 mg) by mouth daily in the morning & take 2 capsules (600 mg) by mouth at night. 12/30/18   Cammie Sickle, MD  HYDROcodone-acetaminophen (NORCO/VICODIN) 5-325 MG tablet Take 1 tablet by mouth every 8 (eight) hours as needed (pain.). 02/28/19   Cammie Sickle, MD  lidocaine-prilocaine (EMLA) cream Apply 1 application topically as needed. Apply generously over the Mediport 45 minutes prior to chemotherapy. Patient taking differently: Apply 1 application topically as needed (prior to chemotherapy.).  05/27/18   Cammie Sickle, MD  loperamide (IMODIUM) 2 MG capsule Take 2 mg by mouth 4 (four) times daily as needed for diarrhea or loose stools.     [provider]  Multiple Vitamin (MULTIVITAMIN WITH MINERALS) TABS tablet Take 1 tablet by mouth daily.    [provider]  ondansetron (ZOFRAN) 8 MG tablet One pill every 8 hours as needed for nausea/vomitting. Patient taking differently: Take 8 mg by mouth every 8  (eight) hours as needed for nausea or vomiting.  09/09/18   Cammie Sickle, MD  oxyCODONE-acetaminophen (PERCOCET) 5-325 MG tablet Take 1 tablet by mouth every 6 (six) hours as needed for up to 5 days for severe pain. 03/03/19 03/08/19  Carrie Mew, MD  potassium chloride SA (K-DUR) 20 MEQ tablet Take 1 tablet (20 mEq total) by mouth 2 (two) times daily. 12/30/18   Cammie Sickle, MD  prochlorperazine (COMPAZINE) 10 MG tablet Take 1 tablet (10 mg total) by mouth every 6 (six) hours as needed for nausea or vomiting. 05/27/18   Cammie Sickle, MD  trifluridine-tipiracil (LONSURF) 20-8.19 MG tablet Take 2 tablets by mouth twice daily. Take 1 hr after AM & PM meals. Take on days 1-5, 8-12. Repeat every 28day 11/14/18   Cammie Sickle, MD     Allergies Patient has no known allergies.   Family History  Problem Relation Age of Onset  . Hypertension Mother   . Arthritis Father   . Prostate cancer Father   . Diabetes Maternal Grandmother   .  Colon cancer Maternal Grandfather        colon    Social History Social History   Tobacco Use  . Smoking status: Former Smoker    Packs/day: 0.25    Years: 2.00    Pack years: 0.50  . Smokeless tobacco: Never Used  . Tobacco comment: recently quit  Substance Use Topics  . Alcohol use: No    Alcohol/week: 0.0 standard drinks  . Drug use: No    Review of Systems  Constitutional:   No fever or chills.  ENT:   No sore throat. No rhinorrhea. Cardiovascular:   No chest pain or syncope. Respiratory:   No dyspnea or cough. Gastrointestinal:   Positive as above for abdominal pain without vomiting and diarrhea.  Musculoskeletal:   Negative for focal pain or swelling All other systems reviewed and are negative except as documented above in ROS and HPI.  ____________________________________________   PHYSICAL EXAM:  VITAL SIGNS: ED Triage Vitals [03/03/19 1122]  Enc Vitals Group     BP (!) 200/89     Pulse Rate (!)  102     Resp 18     Temp 98.5 F (36.9 C)     Temp src      SpO2 98 %     Weight 89 lb (40.4 kg)     Height 5\' 2"  (1.575 m)     Head Circumference      Peak Flow      Pain Score 10     Pain Loc      Pain Edu?      Excl. in [place]?     Vital signs reviewed, nursing assessments reviewed.   Constitutional:   Alert and oriented. Non-toxic appearance. Eyes:   Conjunctivae are normal. EOMI. PERRL. ENT      Head:   Normocephalic and atraumatic.      Nose:   Wearing a mask.      Mouth/Throat:   Wearing a mask.      Neck:   No meningismus. Full ROM. Hematological/Lymphatic/Immunilogical:   No cervical lymphadenopathy. Cardiovascular:   RRR. Symmetric bilateral radial and DP pulses.  No murmurs. Cap refill less than 2 seconds. Respiratory:   Normal respiratory effort without tachypnea/retractions. Breath sounds are clear and equal bilaterally. No wheezes/rales/rhonchi. Gastrointestinal:   Soft without focal tenderness. Non distended. There is no CVA tenderness.  No rebound, rigidity, or guarding. Musculoskeletal:   Normal range of motion in all extremities. No joint effusions.  No lower extremity tenderness.  No edema. Neurologic:   Normal speech and language.  Motor grossly intact. No acute focal neurologic deficits are appreciated.  Skin:    Skin is warm, dry and intact. No rash noted.  No petechiae, purpura, or bullae.  ____________________________________________    LABS (pertinent positives/negatives) (all labs ordered are listed, but only abnormal results are displayed) Labs Reviewed  CBC - Abnormal; Notable for the following components:      Result Value   Hemoglobin 11.9 (*)    RDW 19.7 (*)    All other components within normal limits  BASIC METABOLIC PANEL - Abnormal; Notable for the following components:   Glucose, Bld 103 (*)    All other components within normal limits  LIPASE, BLOOD - Abnormal; Notable for the following components:   Lipase <10 (*)    All other  components within normal limits  HEPATIC FUNCTION PANEL   ____________________________________________   EKG    ____________________________________________    RADIOLOGY  Ct  Head Wo Contrast  Result Date: 03/03/2019 CLINICAL DATA:  Concern for cerebral hemorrhage EXAM: CT HEAD WITHOUT CONTRAST TECHNIQUE: Contiguous axial images were obtained from the base of the skull through the vertex without intravenous contrast. COMPARISON:  Head CT dated 10/04/2004 and MR brain dated 12/04/2018 FINDINGS: Brain: No evidence of acute infarction, hemorrhage, hydrocephalus, extra-axial collection or mass lesion/mass effect. Vascular: No hyperdense vessel or unexpected calcification. Skull: Normal. Negative for fracture or focal lesion. Sinuses/Orbits: No acute finding. Other: None. IMPRESSION: No acute intracranial process. Electronically Signed   By: Zerita Boers M.D.   On: 03/03/2019 14:22    ____________________________________________   PROCEDURES .Critical Care Performed by: Carrie Mew, MD Authorized by: Carrie Mew, MD   Critical care provider statement:    Critical care time (minutes):  33   Critical care time was exclusive of:  Separately billable procedures and treating other patients   Critical care was necessary to treat or prevent imminent or life-threatening deterioration of the following conditions:  Circulatory failure and CNS failure or compromise   Critical care was time spent personally by me on the following activities:  Development of treatment plan with patient or surrogate, discussions with consultants, evaluation of patient's response to treatment, examination of patient, obtaining history from patient or surrogate, ordering and performing treatments and interventions, ordering and review of laboratory studies, ordering and review of radiographic studies, pulse oximetry, re-evaluation of patient's condition and review of old  charts    ____________________________________________  DIFFERENTIAL DIAGNOSIS   Intracerebral hemorrhage, symptomatic hypertension, acute on chronic pain related to abdominal cancer.  CLINICAL IMPRESSION / ASSESSMENT AND PLAN / ED COURSE  Medications ordered in the ED: Medications  amLODipine (NORVASC) tablet 5 mg (has no administration in time range)  labetalol (NORMODYNE) injection 10 mg (10 mg Intravenous Given 03/03/19 1356)  HYDROmorphone (DILAUDID) injection 1 mg (1 mg Intravenous Given 03/03/19 1526)  labetalol (NORMODYNE) injection 10 mg (10 mg Intravenous Given 03/03/19 1527)    Pertinent labs & imaging results that were available during my care of the patient were reviewed by me and considered in my medical decision making (see chart for details).  Nancy Clark was evaluated in Emergency Department on 03/03/2019 for the symptoms described in the history of present illness. She was evaluated in the context of the global COVID-19 pandemic, which necessitated consideration that the patient might be at risk for infection with the SARS-CoV-2 virus that causes COVID-19. Institutional protocols and algorithms that pertain to the evaluation of patients at risk for COVID-19 are in a state of rapid change based on information released by regulatory bodies including the CDC and federal and state organizations. These policies and algorithms were followed during the patient's care in the ED.   Patient presents with worsening of her chronic pain syndrome, associated with increased blood pressure and headache.  No evidence of acute infection such as pneumonia or urinary tract infection or Covid.  Doubt meningitis or encephalitis.  I think her worsening cancer related pain syndrome is because the elevated blood pressures which is in turn causing the headaches.  Low suspicion for intracranial hemorrhage, CT scan obtained which is negative for acute bleeding in the brain and reassuring.  Patient  was given 2 doses of IV labetalol in the ED for acute blood pressure management.  She is also given a milligram of IV Dilaudid, after which she is comfortable, blood pressure is controlled.  Work-up here with labs is reassuring.  Case was discussed with her oncologist  Dr. Rogue Bussing who can facilitate outpatient follow-up in the next 1 to 2 days and feels that no further hospital-based work-up is needed at this time.  Will escalate her pain management regimen with a prescription of Percocet.  Also prescribed Norvasc 5 mg daily which Dr. Rogue Bussing agrees with.  Doubt mesenteric ischemia, dissection, AAA, renal artery stenosis/malignant hypertension.  She is ambulatory with steady gait, symptoms controlled, stable for discharge and outpatient follow-up.      ____________________________________________   FINAL CLINICAL IMPRESSION(S) / ED DIAGNOSES    Final diagnoses:  Hypertension, unspecified type  Chronic pain syndrome     ED Discharge Orders         Ordered    oxyCODONE-acetaminophen (PERCOCET) 5-325 MG tablet  Every 6 hours PRN     03/03/19 1627    amLODipine (NORVASC) 5 MG tablet  Daily     03/03/19 1628          Portions of this note were generated with dragon dictation software. Dictation errors may occur despite best attempts at proofreading.   Carrie Mew, MD 03/03/19 [phone]

## 2019-03-04 ENCOUNTER — Other Ambulatory Visit: Payer: Self-pay

## 2019-03-04 ENCOUNTER — Other Ambulatory Visit: Payer: Self-pay | Admitting: Pharmacist

## 2019-03-04 ENCOUNTER — Encounter: Payer: Self-pay | Admitting: Internal Medicine

## 2019-03-04 DIAGNOSIS — C182 Malignant neoplasm of ascending colon: Secondary | ICD-10-CM

## 2019-03-04 NOTE — Addendum Note (Signed)
Addended by: Sabino Gasser on: 03/04/2019 09:20 AM   Modules accepted: Orders

## 2019-03-04 NOTE — Progress Notes (Signed)
Patient stated that she went to the ED yesterday and was told that she had elevated blood pressure. Patient was given medication and then sent home.

## 2019-03-05 ENCOUNTER — Other Ambulatory Visit: Payer: Self-pay

## 2019-03-05 ENCOUNTER — Inpatient Hospital Stay: Payer: BC Managed Care – PPO

## 2019-03-05 ENCOUNTER — Telehealth: Payer: Self-pay | Admitting: Internal Medicine

## 2019-03-05 ENCOUNTER — Inpatient Hospital Stay (HOSPITAL_BASED_OUTPATIENT_CLINIC_OR_DEPARTMENT_OTHER): Payer: BC Managed Care – PPO | Admitting: Internal Medicine

## 2019-03-05 DIAGNOSIS — C772 Secondary and unspecified malignant neoplasm of intra-abdominal lymph nodes: Secondary | ICD-10-CM

## 2019-03-05 DIAGNOSIS — C180 Malignant neoplasm of cecum: Secondary | ICD-10-CM | POA: Diagnosis not present

## 2019-03-05 DIAGNOSIS — C182 Malignant neoplasm of ascending colon: Secondary | ICD-10-CM

## 2019-03-05 DIAGNOSIS — Z23 Encounter for immunization: Secondary | ICD-10-CM

## 2019-03-05 LAB — COMPREHENSIVE METABOLIC PANEL
ALT: 10 U/L (ref 0–44)
AST: 15 U/L (ref 15–41)
Albumin: 3.9 g/dL (ref 3.5–5.0)
Alkaline Phosphatase: 103 U/L (ref 38–126)
Anion gap: 10 (ref 5–15)
BUN: 10 mg/dL (ref 6–20)
CO2: 22 mmol/L (ref 22–32)
Calcium: 8.7 mg/dL — ABNORMAL LOW (ref 8.9–10.3)
Chloride: 106 mmol/L (ref 98–111)
Creatinine, Ser: 0.62 mg/dL (ref 0.44–1.00)
GFR calc Af Amer: >60 mL/min (ref >60)
GFR calc non Af Amer: >60 mL/min (ref >60)
Glucose, Bld: 120 mg/dL — ABNORMAL HIGH (ref 70–99)
Potassium: 3 mmol/L — ABNORMAL LOW (ref 3.5–5.1)
Sodium: 138 mmol/L (ref 135–145)
Total Bilirubin: 0.5 mg/dL (ref 0.3–1.2)
Total Protein: 7.2 g/dL (ref 6.5–8.1)

## 2019-03-05 LAB — CBC WITH DIFFERENTIAL/PLATELET
Abs Immature Granulocytes: 0.04 10*3/uL (ref 0.00–0.07)
Basophils Absolute: 0 10*3/uL (ref 0.0–0.1)
Basophils Relative: 1 %
Eosinophils Absolute: 0.1 10*3/uL (ref 0.0–0.5)
Eosinophils Relative: 2 %
HCT: 34.4 % — ABNORMAL LOW (ref 36.0–46.0)
Hemoglobin: 11.1 g/dL — ABNORMAL LOW (ref 12.0–15.0)
Immature Granulocytes: 1 %
Lymphocytes Relative: 16 %
Lymphs Abs: 1 10*3/uL (ref 0.7–4.0)
MCH: 31.1 pg (ref 26.0–34.0)
MCHC: 32.3 g/dL (ref 30.0–36.0)
MCV: 96.4 fL (ref 80.0–100.0)
Monocytes Absolute: 1 10*3/uL (ref 0.1–1.0)
Monocytes Relative: 16 %
Neutro Abs: 4.1 10*3/uL (ref 1.7–7.7)
Neutrophils Relative %: 64 %
Platelets: 225 10*3/uL (ref 150–400)
RBC: 3.57 MIL/uL — ABNORMAL LOW (ref 3.87–5.11)
RDW: 19.3 % — ABNORMAL HIGH (ref 11.5–15.5)
WBC: 6.3 10*3/uL (ref 4.0–10.5)
nRBC: 0 % (ref 0.0–0.2)

## 2019-03-05 LAB — URINALYSIS, COMPLETE (UACMP) WITH MICROSCOPIC
Bacteria, UA: NONE SEEN
Bilirubin Urine: NEGATIVE
Glucose, UA: NEGATIVE mg/dL
Hgb urine dipstick: NEGATIVE
Ketones, ur: 5 mg/dL — AB
Leukocytes,Ua: NEGATIVE
Nitrite: NEGATIVE
Protein, ur: 30 mg/dL — AB
Specific Gravity, Urine: 1.027 (ref 1.005–1.030)
pH: 5 (ref 5.0–8.0)

## 2019-03-05 MED ORDER — SODIUM CHLORIDE 0.9 % IV SOLN
2000.0000 mg/m2 | INTRAVENOUS | Status: DC
  Administered 2019-03-05: 12:00:00 2900 mg via INTRAVENOUS
  Filled 2019-03-05: qty 50

## 2019-03-05 MED ORDER — ONDANSETRON HCL 4 MG/2ML IJ SOLN
8.0000 mg | Freq: Once | INTRAMUSCULAR | Status: AC
  Administered 2019-03-05: 11:00:00 8 mg via INTRAVENOUS
  Filled 2019-03-05: qty 4

## 2019-03-05 MED ORDER — SODIUM CHLORIDE 0.9 % IV SOLN
Freq: Once | INTRAVENOUS | Status: AC
  Administered 2019-03-05: 11:00:00 via INTRAVENOUS
  Filled 2019-03-05: qty 250

## 2019-03-05 MED ORDER — FENTANYL 50 MCG/HR TD PT72: 1.0000 | MEDICATED_PATCH | TRANSDERMAL | 0 refills | Status: DC

## 2019-03-05 MED ORDER — ATROPINE SULFATE 1 MG/ML IJ SOLN: 0.5000 mg | Freq: Once | INTRAMUSCULAR | Status: DC | PRN

## 2019-03-05 MED ORDER — SODIUM CHLORIDE 0.9% FLUSH
10.0000 mL | Freq: Once | INTRAVENOUS | Status: DC
  Filled 2019-03-05: qty 10

## 2019-03-05 MED ORDER — INFLUENZA VAC SPLIT QUAD 0.5 ML IM SUSY
0.5000 mL | PREFILLED_SYRINGE | Freq: Once | INTRAMUSCULAR | Status: AC
  Administered 2019-03-05: 12:00:00 0.5 mL via INTRAMUSCULAR
  Filled 2019-03-05: qty 0.5

## 2019-03-05 MED ORDER — HEPARIN SOD (PORK) LOCK FLUSH 100 UNIT/ML IV SOLN: 500.0000 [IU] | Freq: Once | INTRAVENOUS | Status: DC

## 2019-03-05 MED ORDER — DEXAMETHASONE SODIUM PHOSPHATE 10 MG/ML IJ SOLN
10.0000 mg | Freq: Once | INTRAMUSCULAR | Status: AC
  Administered 2019-03-05: 11:00:00 10 mg via INTRAVENOUS
  Filled 2019-03-05: qty 1

## 2019-03-05 MED ORDER — SODIUM CHLORIDE 0.9 % IV SOLN
225.0000 mg | Freq: Once | INTRAVENOUS | Status: AC
  Administered 2019-03-05: 225 mg via INTRAVENOUS
  Filled 2019-03-05: qty 9

## 2019-03-05 NOTE — Progress Notes (Signed)
[place] [facility] OFFICE PROGRESS NOTE  Patient Care Team: Patient, No Pcp Per as PCP - General (General Practice)  Cancer Staging No matching staging information was found for the patient.   Oncology History Overview Note  [facility]- COLON [facility]CER- STAGE IV [s/p right hemi-colectomy; pT4apN2 (7/19LN)]; Pre-op CEA- [id number]  PET-  mesenteric adenopathy/mediastinal adenopathy/right-sided subclavicular lymph node.    April 17th -START FOLFOX; May 1st Add Avastin;   Aug 16th- Disc OX [sec PN]; con 5FU-Avastin; NOV 27th CT C/A/P- CR; except for 28m LUL; 5FU-Avastin q 3 W [Poor tolerance to FOLFOX].  # June/July 2019- Progression of Liver/Abd LN; July 2019- START FOLFIRI + Avastin; STOP iri 02/25/2018- sec to diarrhea; cont Avastin + 5FU  # March 2018- L2 uptake MET s/p RT [[facility]2018]; mid April X-geva  # L1 ablation/ostecool-  Jan 3rd 2020-   # march 2nd 2020- decrease 5FU CIV  to 20064mm2/  #June 17th, 2020-liver ablation; 3.5 cm lesion.  Intolerance to 5-FU plus Avastin.  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 McQueen] CT-chest NEG; MRI brain negative.   # MOLECULAR TESTING: K-ras-EXON 2- MUTATED; MSI- STABLE.   DIAGNOSIS: colon ca  STAGE:  IV ;GOALS: pallaitive  CURRENT/MOST RECENT THERAPY: 5-FU plus Avastin    Cancer of ascending colon metastatic to intra-abdominal lymph node (HCC)    INTERVAL HISTORY:  Nancy FERRINGTON89.o.  female pleasant patient above history of metastatic adenocarcinoma of the colon-most recently on Lonsurf is here for follow-up.  Patient had CT scan 2 weeks ago that showed progressive disease in the mesenteric area.  She is here to proceed with  chemotherapy-5-FU Avastin.  In the interim patient was seen in the emergency room 2 days ago for worsening abdominal pain headaches; noted to have elevated blood pressure of 20081ystolic.  Patient is currently back on amlodipine 5 mg.   Patient continues to have abdominal pain/back pain.  She is currently on 25 mcg of fentanyl patch; and also Percocet [through ER]-6 to 8 hours.  Patient's hydrocodone has not arrived/remitting pharmacy yet.  Appetite is poor.  Complains of fatigue.   Review of Systems  Constitutional: Positive for malaise/fatigue. Negative for chills, diaphoresis and fever.  HENT: Negative for nosebleeds and sore throat.   Eyes: Negative for double vision.  Respiratory: Negative for cough, hemoptysis, sputum production, shortness of breath and wheezing.   Cardiovascular: Negative for chest pain, palpitations, orthopnea and leg swelling.  Gastrointestinal: Positive for abdominal pain and nausea. Negative for constipation, heartburn, melena and vomiting.  Genitourinary: Negative for dysuria, frequency and urgency.  Musculoskeletal: Positive for back pain. Negative for joint pain.  Skin: Negative.  Negative for itching and rash.  Neurological: Positive for tingling. Negative for dizziness, focal weakness, weakness and headaches.  Endo/Heme/Allergies: Does not bruise/bleed easily.  Psychiatric/Behavioral: Negative for depression. The patient is not nervous/anxious and does not have insomnia.       PAST MEDICAL HISTORY :  Past Medical History:  Diagnosis Date  . Chicken pox   . Colon cancer ([facility]   Partial colectomy 07/2015 + chemo tx's  . Hypertension   . Hypokalemia   . Menopause    > 5 yrs  . Peripheral [facility] due to [facility] ([facility]   bi  lat feet    PAST SURGICAL HISTORY :   Past Surgical History:  Procedure Laterality Date  . BREAST BIOPSY Right    2007? pt unsure done by [facility]  . COLONOSCOPY    . COLONOSCOPY WITH PROPOFOL N/A 10/14/2015   Procedure:  COLONOSCOPY WITH PROPOFOL;  Surgeon: Hulen Luster, MD;  Location: [facility] ENDOSCOPY;  Service: Endoscopy;  Laterality: N/A;  . COLOSTOMY REVISION Right 08/09/2015   Procedure: COLON RESECTION RIGHT;  Surgeon: Florene Glen, MD;  Location: [facility] ORS;  Service: General;  Laterality: Right;  . ECTOPIC PREGNANCY SURGERY    . IR BONE TUMOR(S)RF ABLATION  05/24/2018  . IR KYPHO LUMBAR INC FX REDUCE BONE BX UNI/BIL CANNULATION INC/IMAGING  05/24/2018  . IR RADIOLOGIST EVAL & MGMT  05/08/2018  . IR RADIOLOGIST EVAL & MGMT  06/18/2018  . IR RADIOLOGIST EVAL & MGMT  07/31/2018  . IR RADIOLOGIST EVAL & MGMT  12/19/2018  . IR RADIOLOGIST EVAL & MGMT  02/27/2019  . PORTACATH PLACEMENT N/A 09/01/2015   Procedure: INSERTION PORT-A-CATH;  Surgeon: Florene Glen, MD;  Location: [facility] ORS;  Service: General;  Laterality: N/A;  . RADIOLOGY WITH ANESTHESIA N/A 11/06/2018   Procedure: CT-MICROWAVE THERMAL ABLATION/LIVER;  Surgeon: Corrie Mckusick, DO;  Location: [facility] ORS;  Service: Anesthesiology;  Laterality: N/A;  . SHOULDER SURGERY Left     FAMILY HISTORY :   Family History  Problem Relation Age of Onset  . Hypertension Mother   . Arthritis Father   . Prostate cancer Father   . Diabetes Maternal Grandmother   . Colon cancer Maternal Grandfather        colon    SOCIAL HISTORY:   Social History   Tobacco Use  . Smoking status: Former Smoker    Packs/day: 0.25    Years: 2.00    Pack years: 0.50  . Smokeless tobacco: Never Used  . Tobacco comment: recently quit  Substance Use Topics  . Alcohol use: No    Alcohol/week: 0.0 standard drinks  . Drug use: No    ALLERGIES:  has No Known Allergies.  MEDICATIONS:  Current Outpatient Medications  Medication Sig Dispense Refill  . acetaminophen (TYLENOL) 325 MG tablet Take 2 tablets (650 mg total) by mouth every 6 (six) hours as needed for mild pain (or Fever >/= 101).    Marland Kitchen amLODipine (NORVASC) 5 MG tablet Take 1 tablet (5 mg total) by mouth daily. 30 tablet 1  .  Calcium Carbonate-Vitamin D (CALCIUM 600/VITAMIN D PO) Take 1 tablet by mouth every evening.     . diphenoxylate-atropine (LOMOTIL) 2.5-0.025 MG tablet Take 1 tablet by mouth 4 (four) times daily as needed for diarrhea or loose stools. Take it along with immodium 40 tablet 4  . dronabinol (MARINOL) 5 MG capsule Take 1 capsule (5 mg total) by mouth 2 (two) times daily before lunch and supper. 60 capsule 4  . DULoxetine (CYMBALTA) 20 MG capsule Take 2 capsules (40 mg total) by mouth daily. (Patient taking differently: Take 20 mg by mouth 2 (two) times daily. ) 180 capsule 3  . feeding supplement (BOOST HIGH PROTEIN) LIQD Take 1 Container by mouth daily.     . fentaNYL (DURAGESIC) 25 MCG/HR Place 1 patch onto the skin every 3 (three) days. 10 patch 0  . ferrous sulfate (FERROUSUL) 325 (65 FE) MG tablet Take 1 tablet (325 mg total) by mouth daily with breakfast. 90 tablet 3  . gabapentin (NEURONTIN) 300 MG capsule Take 1-2 capsules (300-600  mg total) by mouth See admin instructions. Take 1 capsule (300 mg) by mouth daily in the morning & take 2 capsules (600 mg) by mouth at night. 90 capsule 6  . HYDROcodone-acetaminophen (NORCO/VICODIN) 5-325 MG tablet Take 1 tablet by mouth every 8 (eight) hours as needed (pain.). 90 tablet 0  . lidocaine-prilocaine (EMLA) cream Apply 1 application topically as needed. Apply generously over the Mediport 45 minutes prior to chemotherapy. (Patient taking differently: Apply 1 application topically as needed (prior to chemotherapy.). ) 30 g 6  . loperamide (IMODIUM) 2 MG capsule Take 2 mg by mouth 4 (four) times daily as needed for diarrhea or loose stools.     . Multiple Vitamin (MULTIVITAMIN WITH MINERALS) TABS tablet Take 1 tablet by mouth daily.    . ondansetron (ZOFRAN) 8 MG tablet One pill every 8 hours as needed for nausea/vomitting. (Patient taking differently: Take 8 mg by mouth every 8 (eight) hours as needed for nausea or vomiting. ) 60 tablet 0  .  oxyCODONE-acetaminophen (PERCOCET) 5-325 MG tablet Take 1 tablet by mouth every 6 (six) hours as needed for up to 5 days for severe pain. 15 tablet 0  . potassium chloride SA (K-DUR) 20 MEQ tablet Take 1 tablet (20 mEq total) by mouth 2 (two) times daily. 60 tablet 6  . prochlorperazine (COMPAZINE) 10 MG tablet Take 1 tablet (10 mg total) by mouth every 6 (six) hours as needed for nausea or vomiting. 90 tablet 3  . fentaNYL (DURAGESIC) 50 MCG/HR Place 1 patch onto the skin every 3 (three) days. 19 patch 0   No current facility-administered medications for this visit.    Facility-Administered Medications Ordered in Other Visits  Medication Dose Route Frequency Provider Last Rate Last Dose  . heparin lock flush 100 unit/mL  500 Units Intravenous Once Charlaine Dalton R, MD      . sodium chloride flush (NS) 0.9 % injection 10 mL  10 mL Intravenous PRN Cammie Sickle, MD   10 mL at 10/04/15 0901  . sodium chloride flush (NS) 0.9 % injection 10 mL  10 mL Intravenous PRN Cammie Sickle, MD   10 mL at 07/02/17 1020  . sodium chloride flush (NS) 0.9 % injection 10 mL  10 mL Intravenous Once Cammie Sickle, MD        PHYSICAL EXAMINATION: ECOG PERFORMANCE STATUS: 1 - Symptomatic but completely ambulatory  BP (!) 147/76 Comment: recheck  Pulse 96   Temp (!) 96.6 F (35.9 C) (Tympanic)   Resp 18   Ht '5\' 2"'  (1.575 m)   Wt 91 lb (41.3 kg)   LMP  (LMP Unknown) Comment: LMP MORE THAN 5 YRS  BMI 16.64 kg/m   Filed Weights   03/05/19 0930  Weight: 91 lb (41.3 kg)    Physical Exam  Constitutional: She is oriented to person, place, and time and well-developed, well-nourished, and in no distress.  She is alone. She is walking herself.  HENT:  Head: Normocephalic and atraumatic.  Mouth/Throat: Oropharynx is clear and moist. No oropharyngeal exudate.  Eyes: Pupils are equal, round, and reactive to light.  Neck: Normal range of motion. Neck supple.  Cardiovascular: Normal  rate and regular rhythm.  Pulmonary/Chest: Effort normal and breath sounds normal. No respiratory distress. She has no wheezes.  Abdominal: Soft. Bowel sounds are normal. She exhibits no distension and no mass. There is no abdominal tenderness. There is no rebound and no guarding.  Musculoskeletal: Normal range of motion.  General: No tenderness or edema.  Neurological: She is alert and oriented to person, place, and time.  Skin: Skin is warm.  Psychiatric: Affect normal.   LABORATORY DATA:  I have reviewed the data as listed    Component Value Date/Time   NA 138 03/05/2019 0917   K 3.0 (L) 03/05/2019 0917   CL 106 03/05/2019 0917   CO2 22 03/05/2019 0917   GLUCOSE 120 (H) 03/05/2019 0917   BUN 10 03/05/2019 0917   CREATININE 0.62 03/05/2019 0917   CALCIUM 8.7 (L) 03/05/2019 0917   PROT 7.2 03/05/2019 0917   ALBUMIN 3.9 03/05/2019 0917   AST 15 03/05/2019 0917   ALT 10 03/05/2019 0917   ALKPHOS 103 03/05/2019 0917   BILITOT 0.5 03/05/2019 0917   GFRNONAA >60 03/05/2019 0917   GFRAA >60 03/05/2019 0917    No results found for: SPEP, UPEP  Lab Results  Component Value Date   WBC 6.3 03/05/2019   NEUTROABS 4.1 03/05/2019   HGB 11.1 (L) 03/05/2019   HCT 34.4 (L) 03/05/2019   MCV 96.4 03/05/2019   PLT 225 03/05/2019      Chemistry      Component Value Date/Time   NA 138 03/05/2019 0917   K 3.0 (L) 03/05/2019 0917   CL 106 03/05/2019 0917   CO2 22 03/05/2019 0917   BUN 10 03/05/2019 0917   CREATININE 0.62 03/05/2019 0917      Component Value Date/Time   CALCIUM 8.7 (L) 03/05/2019 0917   ALKPHOS 103 03/05/2019 0917   AST 15 03/05/2019 0917   ALT 10 03/05/2019 0917   BILITOT 0.5 03/05/2019 0917       RADIOGRAPHIC STUDIES: I have personally reviewed the radiological images as listed and agreed with the findings in the report. Ct Head Wo Contrast  Result Date: 03/03/2019 CLINICAL DATA:  Concern for cerebral hemorrhage EXAM: CT HEAD WITHOUT CONTRAST  TECHNIQUE: Contiguous axial images were obtained from the base of the skull through the vertex without intravenous contrast. COMPARISON:  Head CT dated 10/04/2004 and MR brain dated 12/04/2018 FINDINGS: Brain: No evidence of acute infarction, hemorrhage, hydrocephalus, extra-axial collection or mass lesion/mass effect. Vascular: No hyperdense vessel or unexpected calcification. Skull: Normal. Negative for fracture or focal lesion. Sinuses/Orbits: No acute finding. Other: None. IMPRESSION: No acute intracranial process. Electronically Signed   By: Zerita Boers M.D.   On: 03/03/2019 14:22     ASSESSMENT & PLAN:  Cancer of ascending colon metastatic to intra-abdominal lymph node [facility]) # Colon cancer- cecal/ right-sided; STAGE IV; October 2 CT scan stable liver lesions post ablation; however approximately 3 cm mesenteric lesion noted/concern for progressive disease.  I would recommend reinitiation of systemic therapy-as patient is symptomatic.  #Proceed with 5-FU plus bevacizumab [poor tolerance to irinotecan-diarrhea/oxaliplatin-neuropathy].  Discussed treatments are palliative not curative.  Monitor her blood pressure closely.  #Ongoing diarrhea-1-2 loose stools every day.  Chronic.  Monitor closely.  Continue antidiarrheals.  # Abdominal discomfort/epigastric/ back pain -worse.  Sec to malignancy; fentany/ hydocodone- increase to TID.new script for fentanyl given/ increased to 50 mcg   #Hypertension-recently 559 systolic in the emergency room.  Currently on Norvasc 5 mg blood pressure 147/76 proceed with Avastin.  # hypokalemia- 3.0 sec to diarrhea/  Kdur to 20 TID; continue.     # Peripheral neuropathy -stable  #Patient agreement to get a flu shot today.  #Will call patient's mother later in the afternoon to discuss her care.  # Palliative care: I reviewed the goals  of care with the patient being palliative; also introduced DNR/DNI discussion today.  We will make a referral to [facility]; patient  agreement.  # DISPOSITION: # flu shot today # 5FU-Avastin today # /Josh/palliative care in 2 weeks # follow up in 2 weeks- X-MD; labs- cbc/cmp/CEA; UA;5FU-Avastin; pump off 2 days later.      Orders Placed This Encounter  Procedures  . CBC with Differential    Standing Status:   Future    Standing Expiration Date:   03/04/2020  . Comprehensive metabolic panel    Standing Status:   Future    Standing Expiration Date:   03/04/2020  . CEA    Standing Status:   Future    Standing Expiration Date:   03/04/2020  . Urinalysis, Complete w Microscopic    Standing Status:   Future    Standing Expiration Date:   03/04/2020   All questions were answered. The patient knows to call the clinic with any problems, questions or concerns.      Cammie Sickle, MD 03/05/2019 10:00 AM

## 2019-03-05 NOTE — Assessment & Plan Note (Addendum)
#   Colon cancer- cecal/ right-sided; STAGE IV; October 2 CT scan stable liver lesions post ablation; however approximately 3 cm mesenteric lesion noted/concern for progressive disease.  I would recommend reinitiation of systemic therapy-as patient is symptomatic.  #Proceed with 5-FU plus bevacizumab [poor tolerance to irinotecan-diarrhea/oxaliplatin-neuropathy].  Discussed treatments are palliative not curative.  Monitor her blood pressure closely.  #Ongoing diarrhea-1-2 loose stools every day.  Chronic.  Monitor closely.  Continue antidiarrheals.  # Abdominal discomfort/epigastric/ back pain -worse.  Sec to malignancy; fentany/ hydocodone- increase to TID.new script for fentanyl given/ increased to 50 mcg   #Hypertension-recently [id number] systolic in the emergency room.  Currently on Norvasc 5 mg blood pressure 147/76 proceed with Avastin.  # hypokalemia- 3.0 sec to diarrhea/  Kdur to 20 TID; continue.     # Peripheral neuropathy -stable  #Patient agreement to get a flu shot today.  #Will call patient's mother later in the afternoon to discuss her care.  # Palliative care: I reviewed the goals of care with the patient being palliative; also introduced DNR/DNI discussion today.  We will make a referral to [facility]; patient agreement.  # DISPOSITION: # flu shot today # 5FU-Avastin today # /Josh/palliative care in 2 weeks # follow up in 2 weeks- X-MD; labs- cbc/cmp/CEA; UA;5FU-Avastin; pump off 2 days later.

## 2019-03-05 NOTE — Telephone Encounter (Signed)
Spoke to patient's mother regarding patient's symptomatic recurrence/previous poor tolerance to therapy.   Monitor closely regarding response to therapy/tolerance to therapy.  Understands treatments are not curative/only palliative.  Mother states that patient husband and son aware of patient's condition/overall poor prognosis.  Also discussed regarding palliative care evaluation at next visit.  However we will continue palliative chemotherapy at this time.  If progression/poor tolerance noted- REGO-NIVO would be a consideration.

## 2019-03-06 LAB — CEA
CEA: 25.6 ng/mL — ABNORMAL HIGH (ref 0.0–4.7)
CEA: 25.4 ng/mL — ABNORMAL HIGH (ref 0.0–4.7)

## 2019-03-07 ENCOUNTER — Other Ambulatory Visit: Payer: Self-pay

## 2019-03-07 ENCOUNTER — Inpatient Hospital Stay: Payer: BC Managed Care – PPO

## 2019-03-07 ENCOUNTER — Other Ambulatory Visit: Payer: Self-pay | Admitting: Internal Medicine

## 2019-03-07 VITALS — BP 153/72 | HR 86 | Temp 96.8°F | Resp 18

## 2019-03-07 DIAGNOSIS — C772 Secondary and unspecified malignant neoplasm of intra-abdominal lymph nodes: Secondary | ICD-10-CM

## 2019-03-07 DIAGNOSIS — C182 Malignant neoplasm of ascending colon: Secondary | ICD-10-CM

## 2019-03-07 DIAGNOSIS — C180 Malignant neoplasm of cecum: Secondary | ICD-10-CM | POA: Diagnosis not present

## 2019-03-07 MED ORDER — HEPARIN SOD (PORK) LOCK FLUSH 100 UNIT/ML IV SOLN
500.0000 [IU] | Freq: Once | INTRAVENOUS | Status: AC | PRN
  Administered 2019-03-07: 500 [IU]
  Filled 2019-03-07: qty 5

## 2019-03-07 MED ORDER — SODIUM CHLORIDE 0.9% FLUSH
10.0000 mL | INTRAVENOUS | Status: DC | PRN
  Administered 2019-03-07: 12:00:00 10 mL
  Filled 2019-03-07: qty 10

## 2019-03-16 NOTE — Progress Notes (Signed)
[place]  [phone] [phone]) [phone]  ID: Nancy Clark OB: 1961/02/20  MR#: [id number]  [id number]  Patient Care Team: Patient, No Pcp Per as PCP - General (General Practice)  CHIEF COMPLAINT: Stage IV colon cancer.  INTERVAL HISTORY: Patient returns to clinic today for further evaluation and continuation of Mvasi and 5-FU.  She continues to have chronic abdominal and back pain, but this is currently well controlled on her current narcotic regimen.  She has chronic weakness and fatigue.  She continues to have diarrhea, but this is also well controlled on current medications.  She otherwise feels well.  She has no neurologic complaints.  She denies any recent fevers or illnesses.  She has a fair appetite, but denies weight loss.  She has no chest pain, shortness of breath, cough, or hemoptysis.  She denies any nausea, vomiting, or constipation.  She has no urinary complaints.  Patient otherwise feels well and offers no further specific complaints today.  REVIEW OF SYSTEMS:   Review of Systems  Constitutional: Positive for malaise/fatigue. Negative for fever and weight loss.  Respiratory: Negative for cough, hemoptysis and shortness of breath.   Cardiovascular: Negative.  Negative for chest pain and leg swelling.  Gastrointestinal: Positive for abdominal pain and diarrhea. Negative for blood in stool, constipation, melena and nausea.  Genitourinary: Negative.  Negative for dysuria.  Musculoskeletal: Positive for back pain.  Skin: Negative.  Negative for rash.  Neurological: Positive for weakness. Negative for dizziness, focal weakness and headaches.  Psychiatric/Behavioral: Negative.  The patient is not nervous/anxious.     As per HPI. Otherwise, a complete review of systems is negative.  PAST MEDICAL HISTORY: Past Medical History:  Diagnosis Date   Chicken pox    Colon cancer ([place])    Partial colectomy 07/2015 + chemo tx's    Hypertension    Hypokalemia    Menopause    > 5 yrs   Peripheral neuropathy due to chemotherapy ([place])    bi lat feet    PAST SURGICAL HISTORY: Past Surgical History:  Procedure Laterality Date   BREAST BIOPSY Right    2007? pt unsure done by [facility]   COLONOSCOPY     COLONOSCOPY WITH PROPOFOL N/A 10/14/2015   Procedure: COLONOSCOPY WITH PROPOFOL;  Surgeon: Hulen Luster, MD;  Location: [facility] ENDOSCOPY;  Service: Endoscopy;  Laterality: N/A;   COLOSTOMY REVISION Right 08/09/2015   Procedure: COLON RESECTION RIGHT;  Surgeon: Florene Glen, MD;  Location: [facility] ORS;  Service: General;  Laterality: Right;   ECTOPIC PREGNANCY SURGERY     IR BONE TUMOR(S)RF ABLATION  05/24/2018   IR KYPHO LUMBAR INC FX REDUCE BONE BX UNI/BIL CANNULATION INC/IMAGING  05/24/2018   IR RADIOLOGIST EVAL & MGMT  05/08/2018   IR RADIOLOGIST EVAL & MGMT  06/18/2018   IR RADIOLOGIST EVAL & MGMT  07/31/2018   IR RADIOLOGIST EVAL & MGMT  12/19/2018   IR RADIOLOGIST EVAL & MGMT  02/27/2019   PORTACATH PLACEMENT N/A 09/01/2015   Procedure: INSERTION PORT-A-CATH;  Surgeon: Florene Glen, MD;  Location: [facility] ORS;  Service: General;  Laterality: N/A;   RADIOLOGY WITH ANESTHESIA N/A 11/06/2018   Procedure: CT-MICROWAVE THERMAL ABLATION/LIVER;  Surgeon: Corrie Mckusick, DO;  Location: [facility] ORS;  Service: Anesthesiology;  Laterality: N/A;   SHOULDER SURGERY Left     FAMILY HISTORY: Family History  Problem Relation Age of Onset   Hypertension Mother    Arthritis Father    Prostate cancer Father  Diabetes Maternal Grandmother    Colon cancer Maternal Grandfather        colon    ADVANCED DIRECTIVES (Y/N):  N  HEALTH MAINTENANCE: Social History   Tobacco Use   Smoking status: Former Smoker    Packs/day: 0.25    Years: 2.00    Pack years: 0.50   Smokeless tobacco: Never Used   Tobacco comment: recently quit  Substance Use Topics   Alcohol use: No    Alcohol/week: 0.0 standard drinks   Drug use:  No     Colonoscopy:  PAP:  Bone density:  Lipid panel:  No Known Allergies  Current Outpatient Medications  Medication Sig Dispense Refill   acetaminophen (TYLENOL) 325 MG tablet Take 2 tablets (650 mg total) by mouth every 6 (six) hours as needed for mild pain (or Fever >/= 101).     Calcium Carbonate-Vitamin D (CALCIUM 600/VITAMIN D PO) Take 1 tablet by mouth every evening.      diphenoxylate-atropine (LOMOTIL) 2.5-0.025 MG tablet Take 1 tablet by mouth 4 (four) times daily as needed for diarrhea or loose stools. Take it along with immodium 40 tablet 4   dronabinol (MARINOL) 5 MG capsule Take 1 capsule (5 mg total) by mouth 2 (two) times daily before lunch and supper. 60 capsule 4   DULoxetine (CYMBALTA) 20 MG capsule Take 2 capsules (40 mg total) by mouth daily. (Patient taking differently: Take 20 mg by mouth 2 (two) times daily. ) 180 capsule 3   feeding supplement (BOOST HIGH PROTEIN) LIQD Take 1 Container by mouth daily.      fentaNYL (DURAGESIC) 25 MCG/HR Place 1 patch onto the skin every 3 (three) days. 10 patch 0   fentaNYL (DURAGESIC) 50 MCG/HR Place 1 patch onto the skin every 3 (three) days. 19 patch 0   ferrous sulfate (FERROUSUL) 325 (65 FE) MG tablet Take 1 tablet (325 mg total) by mouth daily with breakfast. 90 tablet 3   gabapentin (NEURONTIN) 300 MG capsule Take 1-2 capsules (300-600 mg total) by mouth See admin instructions. Take 1 capsule (300 mg) by mouth daily in the morning & take 2 capsules (600 mg) by mouth at night. 90 capsule 6   HYDROcodone-acetaminophen (NORCO/VICODIN) 5-325 MG tablet Take 1 tablet by mouth every 8 (eight) hours as needed (pain.). 90 tablet 0   lidocaine-prilocaine (EMLA) cream Apply 1 application topically as needed. Apply generously over the Mediport 45 minutes prior to chemotherapy. (Patient taking differently: Apply 1 application topically as needed (prior to chemotherapy.). ) 30 g 6   loperamide (IMODIUM) 2 MG capsule Take 2 mg  by mouth 4 (four) times daily as needed for diarrhea or loose stools.      Multiple Vitamin (MULTIVITAMIN WITH MINERALS) TABS tablet Take 1 tablet by mouth daily.     ondansetron (ZOFRAN) 8 MG tablet One pill every 8 hours as needed for nausea/vomitting. (Patient taking differently: Take 8 mg by mouth every 8 (eight) hours as needed for nausea or vomiting. ) 60 tablet 0   potassium chloride SA (K-DUR) 20 MEQ tablet Take 1 tablet (20 mEq total) by mouth 2 (two) times daily. 60 tablet 6   prochlorperazine (COMPAZINE) 10 MG tablet Take 1 tablet (10 mg total) by mouth every 6 (six) hours as needed for nausea or vomiting. 90 tablet 3   amLODipine (NORVASC) 5 MG tablet Take 1 tablet (5 mg total) by mouth daily. 30 tablet 6   No current facility-administered medications for this visit.  Facility-Administered Medications Ordered in Other Visits  Medication Dose Route Frequency Provider Last Rate Last Dose   sodium chloride flush (NS) 0.9 % injection 10 mL  10 mL Intravenous PRN Charlaine Dalton R, MD   10 mL at 10/04/15 0901   sodium chloride flush (NS) 0.9 % injection 10 mL  10 mL Intravenous PRN Charlaine Dalton R, MD   10 mL at 07/02/17 1020    OBJECTIVE: Vitals:   03/19/19 0926  BP: 133/82  Pulse: 67  Resp: 16  Temp: (!) 96.6 F (35.9 C)     Body mass index is 16.44 kg/m.    ECOG FS:1 - Symptomatic but completely ambulatory  General: Well-developed, well-nourished, no acute distress. Eyes: Pink conjunctiva, anicteric sclera. HEENT: Normocephalic, moist mucous membranes. Lungs: Clear to auscultation bilaterally. Heart: Regular rate and rhythm. No rubs, murmurs, or gallops. Abdomen: Soft, nontender, nondistended. No organomegaly noted, normoactive bowel sounds. Musculoskeletal: No edema, cyanosis, or clubbing. Neuro: Alert, answering all questions appropriately. Cranial nerves grossly intact. Skin: No rashes or petechiae noted. Psych: Normal affect.  LAB RESULTS:  Lab  Results  Component Value Date   NA 142 03/19/2019   K 2.7 (LL) 03/19/2019   CL 106 03/19/2019   CO2 26 03/19/2019   GLUCOSE 90 03/19/2019   BUN 7 03/19/2019   CREATININE 0.64 03/19/2019   CALCIUM 8.3 (L) 03/19/2019   PROT 5.8 (L) 03/19/2019   ALBUMIN 3.1 (L) 03/19/2019   AST 12 (L) 03/19/2019   ALT 9 03/19/2019   ALKPHOS 76 03/19/2019   BILITOT 0.4 03/19/2019   GFRNONAA >60 03/19/2019   GFRAA >60 03/19/2019    Lab Results  Component Value Date   WBC 5.1 03/19/2019   NEUTROABS 3.4 03/19/2019   HGB 9.2 (L) 03/19/2019   HCT 29.4 (L) 03/19/2019   MCV 97.0 03/19/2019   PLT 123 (L) 03/19/2019     STUDIES: Ct Head Wo Contrast  Result Date: 03/03/2019 CLINICAL DATA:  Concern for cerebral hemorrhage EXAM: CT HEAD WITHOUT CONTRAST TECHNIQUE: Contiguous axial images were obtained from the base of the skull through the vertex without intravenous contrast. COMPARISON:  Head CT dated 10/04/2004 and MR brain dated 12/04/2018 FINDINGS: Brain: No evidence of acute infarction, hemorrhage, hydrocephalus, extra-axial collection or mass lesion/mass effect. Vascular: No hyperdense vessel or unexpected calcification. Skull: Normal. Negative for fracture or focal lesion. Sinuses/Orbits: No acute finding. Other: None. IMPRESSION: No acute intracranial process. Electronically Signed   By: Zerita Boers M.D.   On: 03/03/2019 14:22   Ct Abdomen Pelvis W Contrast  Result Date: 02/24/2019 CLINICAL DATA:  Patient with metastatic colon cancer. Follow-up exam. EXAM: CT ABDOMEN AND PELVIS WITH CONTRAST TECHNIQUE: Multidetector CT imaging of the abdomen and pelvis was performed using the standard protocol following bolus administration of intravenous contrast. CONTRAST:  17mL OMNIPAQUE IOHEXOL 300 MG/ML  SOLN COMPARISON:  CT abdomen pelvis 11/01/2018 FINDINGS: Lower chest: Normal heart size. Dependent atelectasis left lower lobe. No pleural effusion. Hepatobiliary: Similar-appearing 2-4 mm low-attenuation  lesions within the left and right hepatic lobes (image 10; series 2) (image 7; series 2). Fatty deposition adjacent to the falciform ligament. Redemonstrated post ablation cavity within the right hepatic lobe measuring 3.2 x 2.5 cm, previously 3.3 x 2.9 cm. Overall this is slightly decreased in size when compared to prior chest CT. Gallbladder is unremarkable. No intrahepatic or extrahepatic biliary ductal dilatation. Patchy heterogeneous low attenuation within the central liver left hepatic lobe may be perfusional in etiology. Pancreas: Unremarkable Spleen: Unremarkable Adrenals/Urinary Tract:  Normal adrenal glands. Kidneys enhance symmetrically with contrast. Urinary bladder is unremarkable. Stomach/Bowel: Stool throughout the colon. Normal morphology of the stomach. No evidence for small bowel obstruction. Trace free fluid in the pelvis. No free intraperitoneal air. Vascular/Lymphatic: Tortuous yet normal caliber abdominal aorta with peripheral calcified atherosclerotic plaque. There is increased amorphous soft tissue within the central upper abdomen at the level of the celiac axis and superior mesenteric artery (image 16; series 2) measuring approximately 3.6 x 3.4 cm. There is soft tissue coursing along the celiac branches. There has been some residual soft tissue within this location suspected to represent prior treated disease. Reproductive: Unremarkable Other: None. Musculoskeletal: Lower thoracic and lumbar spine degenerative changes. Similar-appearing patchy sclerotic lesion involving the L2 vertebral body status post kyphoplasty. IMPRESSION: 1. Interval increase in soft tissue within the hepatic hilum at the level of the celiac axis and superior mesenteric artery which may represent recurrent/metastatic disease. 2. Slight interval decrease in size of post ablation cavity within the right hepatic lobe. 3. Similar-appearing sclerotic lesion within the L2 vertebral body status post kyphoplasty. 4. Interval  development small volume ascites. Electronically Signed   By: Lovey Newcomer M.D.   On: 02/24/2019 12:01   [facility] Radiologist Eval & Mgmt  Result Date: 02/27/2019 Please refer to notes tab for details about interventional procedure. (Op Note)   ASSESSMENT: Stage IV colon cancer.  PLAN:    1. Stage IV colon cancer: Recent CT scan on October 2 was reviewed independently and reported as above revealing progressive disease and patient was subsequently switched to treatment with Mvasi and 5-FU.  She had poor tolerance to both Irinotecan and oxaliplatin.  Proceed with her next infusion of Mvasi and 5-FU.  Return to clinic in 2 days for pump removal and then in 2 weeks for further evaluation and continuation of treatment. 2.  Diarrhea: Chronic and unchanged.  Continue current antidiarrheals as prescribed. 3.  Pain: Continue fentanyl patch and hydrocodone as prescribed.  Patient does not feel she needs a change in her medications today. 4.  Hypertension: Better controlled and within normal limits today.  Patient was given a refill of her Norvasc. 5.  Hypokalemia: Patient received 40 mEq of IV potassium today.  Continue oral supplementation as prescribed. 6.  Anemia: Patient's hemoglobin has trended down to 9.2, monitor closely. 7.  Thrombocytopenia: Patient's platelet count is trended down to 123.  Continue to monitor and proceed with treatment as above.  Patient expressed understanding and was in agreement with this plan. She also understands that She can call clinic at any time with any questions, concerns, or complaints.     Lloyd Huger, MD   03/20/2019 12:14 PM

## 2019-03-18 ENCOUNTER — Other Ambulatory Visit: Payer: Self-pay

## 2019-03-18 NOTE — Progress Notes (Signed)
Patient pre screened for office appointment, no questions or concerns today. 

## 2019-03-19 ENCOUNTER — Other Ambulatory Visit: Payer: Self-pay

## 2019-03-19 ENCOUNTER — Inpatient Hospital Stay: Payer: BC Managed Care – PPO

## 2019-03-19 ENCOUNTER — Inpatient Hospital Stay (HOSPITAL_BASED_OUTPATIENT_CLINIC_OR_DEPARTMENT_OTHER): Payer: BC Managed Care – PPO | Admitting: Oncology

## 2019-03-19 ENCOUNTER — Inpatient Hospital Stay (HOSPITAL_BASED_OUTPATIENT_CLINIC_OR_DEPARTMENT_OTHER): Payer: BC Managed Care – PPO | Admitting: Hospice and Palliative Medicine

## 2019-03-19 ENCOUNTER — Other Ambulatory Visit: Payer: Self-pay | Admitting: *Deleted

## 2019-03-19 VITALS — BP 133/82 | HR 67 | Temp 96.6°F | Resp 16 | Wt 89.9 lb

## 2019-03-19 DIAGNOSIS — Z95828 Presence of other vascular implants and grafts: Secondary | ICD-10-CM

## 2019-03-19 DIAGNOSIS — C182 Malignant neoplasm of ascending colon: Secondary | ICD-10-CM

## 2019-03-19 DIAGNOSIS — Z515 Encounter for palliative care: Secondary | ICD-10-CM

## 2019-03-19 DIAGNOSIS — C772 Secondary and unspecified malignant neoplasm of intra-abdominal lymph nodes: Secondary | ICD-10-CM

## 2019-03-19 DIAGNOSIS — E876 Hypokalemia: Secondary | ICD-10-CM

## 2019-03-19 DIAGNOSIS — C180 Malignant neoplasm of cecum: Secondary | ICD-10-CM | POA: Diagnosis not present

## 2019-03-19 LAB — COMPREHENSIVE METABOLIC PANEL
ALT: 9 U/L (ref 0–44)
AST: 12 U/L — ABNORMAL LOW (ref 15–41)
Albumin: 3.1 g/dL — ABNORMAL LOW (ref 3.5–5.0)
Alkaline Phosphatase: 76 U/L (ref 38–126)
Anion gap: 10 (ref 5–15)
BUN: 7 mg/dL (ref 6–20)
CO2: 26 mmol/L (ref 22–32)
Calcium: 8.3 mg/dL — ABNORMAL LOW (ref 8.9–10.3)
Chloride: 106 mmol/L (ref 98–111)
Creatinine, Ser: 0.64 mg/dL (ref 0.44–1.00)
GFR calc Af Amer: >60 mL/min (ref >60)
GFR calc non Af Amer: >60 mL/min (ref >60)
Glucose, Bld: 90 mg/dL (ref 70–99)
Potassium: 2.7 mmol/L — CL (ref 3.5–5.1)
Sodium: 142 mmol/L (ref 135–145)
Total Bilirubin: 0.4 mg/dL (ref 0.3–1.2)
Total Protein: 5.8 g/dL — ABNORMAL LOW (ref 6.5–8.1)

## 2019-03-19 LAB — URINALYSIS, COMPLETE (UACMP) WITH MICROSCOPIC
Bacteria, UA: NONE SEEN
Bilirubin Urine: NEGATIVE
Glucose, UA: NEGATIVE mg/dL
Hgb urine dipstick: NEGATIVE
Ketones, ur: NEGATIVE mg/dL
Nitrite: NEGATIVE
Protein, ur: NEGATIVE mg/dL
Specific Gravity, Urine: 1.017 (ref 1.005–1.030)
pH: 5 (ref 5.0–8.0)

## 2019-03-19 LAB — CBC WITH DIFFERENTIAL/PLATELET
Abs Immature Granulocytes: 0.02 10*3/uL (ref 0.00–0.07)
Basophils Absolute: 0 10*3/uL (ref 0.0–0.1)
Basophils Relative: 0 %
Eosinophils Absolute: 0.1 10*3/uL (ref 0.0–0.5)
Eosinophils Relative: 3 %
HCT: 29.4 % — ABNORMAL LOW (ref 36.0–46.0)
Hemoglobin: 9.2 g/dL — ABNORMAL LOW (ref 12.0–15.0)
Immature Granulocytes: 0 %
Lymphocytes Relative: 16 %
Lymphs Abs: 0.8 10*3/uL (ref 0.7–4.0)
MCH: 30.4 pg (ref 26.0–34.0)
MCHC: 31.3 g/dL (ref 30.0–36.0)
MCV: 97 fL (ref 80.0–100.0)
Monocytes Absolute: 0.7 10*3/uL (ref 0.1–1.0)
Monocytes Relative: 13 %
Neutro Abs: 3.4 10*3/uL (ref 1.7–7.7)
Neutrophils Relative %: 68 %
Platelets: 123 10*3/uL — ABNORMAL LOW (ref 150–400)
RBC: 3.03 MIL/uL — ABNORMAL LOW (ref 3.87–5.11)
RDW: 18.2 % — ABNORMAL HIGH (ref 11.5–15.5)
WBC: 5.1 10*3/uL (ref 4.0–10.5)
nRBC: 0 % (ref 0.0–0.2)

## 2019-03-19 MED ORDER — ONDANSETRON HCL 4 MG/2ML IJ SOLN
8.0000 mg | Freq: Once | INTRAMUSCULAR | Status: AC
  Administered 2019-03-19: 13:00:00 8 mg via INTRAVENOUS
  Filled 2019-03-19: qty 4

## 2019-03-19 MED ORDER — SODIUM CHLORIDE 0.9% FLUSH
10.0000 mL | Freq: Once | INTRAVENOUS | Status: AC
  Administered 2019-03-19: 10 mL via INTRAVENOUS
  Filled 2019-03-19: qty 10

## 2019-03-19 MED ORDER — SODIUM CHLORIDE 0.9 % IV SOLN
40.0000 meq | Freq: Once | INTRAVENOUS | Status: AC
  Administered 2019-03-19: 11:00:00 40 meq via INTRAVENOUS
  Filled 2019-03-19: qty 20

## 2019-03-19 MED ORDER — DEXAMETHASONE SODIUM PHOSPHATE 10 MG/ML IJ SOLN
10.0000 mg | Freq: Once | INTRAMUSCULAR | Status: AC
  Administered 2019-03-19: 13:00:00 10 mg via INTRAVENOUS
  Filled 2019-03-19: qty 1

## 2019-03-19 MED ORDER — SODIUM CHLORIDE 0.9 % IV SOLN
225.0000 mg | Freq: Once | INTRAVENOUS | Status: AC
  Administered 2019-03-19: 13:00:00 225 mg via INTRAVENOUS
  Filled 2019-03-19: qty 9

## 2019-03-19 MED ORDER — SODIUM CHLORIDE 0.9 % IV SOLN
2000.0000 mg/m2 | INTRAVENOUS | Status: DC
  Administered 2019-03-19: 14:00:00 2900 mg via INTRAVENOUS
  Filled 2019-03-19: qty 50

## 2019-03-19 MED ORDER — AMLODIPINE BESYLATE 5 MG PO TABS: 5.0000 mg | ORAL_TABLET | Freq: Every day | ORAL | 6 refills | Status: DC

## 2019-03-19 MED ORDER — SODIUM CHLORIDE 0.9 % IV SOLN
Freq: Once | INTRAVENOUS | Status: AC
  Administered 2019-03-19: 10:00:00 via INTRAVENOUS
  Filled 2019-03-19: qty 250

## 2019-03-19 NOTE — Progress Notes (Signed)
RN reviewed labs with MD and treatment team. Orders placed by treatment team. Verbal order to continue with chemotherapy treatment today. Pt updated and all questions answered at this time.   Marcene Laskowski [employer]

## 2019-03-19 NOTE — Progress Notes (Signed)
[facility]  Telephone:([phone] [phone]   Name: Nancy Clark Date: 03/19/2019 MRN: [id number]  DOB: 11/18/1960  Patient Care Team: Patient, No Pcp Per as PCP - General (General Practice)    REASON FOR CONSULTATION: Palliative Care consult requested for this 58 y.o. female with multiple medical problems including stage IV colorectal cancer status post right hemicolectomy and XRT.  She is also status post liver ablation to area of hepatic metastasis.  Patient is currently being treated on systemic therapy with 5-FU plus bevacizumab .  She was referred to palliative care to help discuss goals and manage ongoing symptoms.   SOCIAL HISTORY:     reports that she has quit smoking. She has a 0.50 pack-year smoking history. She has never used smokeless tobacco. She reports that she does not drink alcohol or use drugs.  Patient is married and lives at home with her husband and son.  She has another son who lives in [place].  Patient previously worked at [employer] in environmental services.  ADVANCE DIRECTIVES:  Does not have  CODE STATUS:   PAST MEDICAL HISTORY: Past Medical History:  Diagnosis Date  . Chicken pox   . Colon cancer ([place])    Partial colectomy 07/2015 + chemo tx's  . Hypertension   . Hypokalemia   . Menopause    > 5 yrs  . Peripheral neuropathy due to chemotherapy ([place])    bi lat feet    PAST SURGICAL HISTORY:  Past Surgical History:  Procedure Laterality Date  . BREAST BIOPSY Right    2007? pt unsure done by [facility]  . COLONOSCOPY    . COLONOSCOPY WITH PROPOFOL N/A 10/14/2015   Procedure: COLONOSCOPY WITH PROPOFOL;  Surgeon: Hulen Luster, MD;  Location: [facility] ENDOSCOPY;  Service: Endoscopy;  Laterality: N/A;  . COLOSTOMY REVISION Right 08/09/2015   Procedure: COLON RESECTION RIGHT;  Surgeon: Florene Glen, MD;  Location: [facility] ORS;  Service: General;  Laterality: Right;  . ECTOPIC  PREGNANCY SURGERY    . IR BONE TUMOR(S)RF ABLATION  05/24/2018  . IR KYPHO LUMBAR INC FX REDUCE BONE BX UNI/BIL CANNULATION INC/IMAGING  05/24/2018  . IR RADIOLOGIST EVAL & MGMT  05/08/2018  . IR RADIOLOGIST EVAL & MGMT  06/18/2018  . IR RADIOLOGIST EVAL & MGMT  07/31/2018  . IR RADIOLOGIST EVAL & MGMT  12/19/2018  . IR RADIOLOGIST EVAL & MGMT  02/27/2019  . PORTACATH PLACEMENT N/A 09/01/2015   Procedure: INSERTION PORT-A-CATH;  Surgeon: Florene Glen, MD;  Location: [facility] ORS;  Service: General;  Laterality: N/A;  . RADIOLOGY WITH ANESTHESIA N/A 11/06/2018   Procedure: CT-MICROWAVE THERMAL ABLATION/LIVER;  Surgeon: Corrie Mckusick, DO;  Location: [facility] ORS;  Service: Anesthesiology;  Laterality: N/A;  . SHOULDER SURGERY Left     HEMATOLOGY/ONCOLOGY HISTORY:  Oncology History Overview Note  # FEB-MARCH 2017- COLON CANCER- STAGE IV [s/p right hemi-colectomy; pT4apN2 (7/19LN)]; Pre-op CEA- [id number]  PET-  mesenteric adenopathy/mediastinal adenopathy/right-sided subclavicular lymph node.    April 17th -START FOLFOX; May 1st Add Avastin;   Aug 16th- Disc OX [sec PN]; con 5FU-Avastin; NOV 27th CT C/A/P- CR; except for 50m LUL; 5FU-Avastin q 3 W [Poor tolerance to FOLFOX].  # June/July 2019- Progression of Liver/Abd LN; July 2019- START FOLFIRI + Avastin; STOP iri 02/25/2018- sec to diarrhea; cont Avastin + 5FU  # March 2018- L2 uptake MET s/p RT [[facility]2018]; mid April X-geva  # L1 ablation/ostecool-  Jan  3rd 2020-   # march 2nd 2020- decrease 5FU CIV  to 2063m/m2/  #June 17th, 2020-liver ablation; 3.5 cm lesion.  Intolerance to 5-FU plus Avastin.  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 McQueen] CT-chest NEG; MRI  brain negative.   # MOLECULAR TESTING: K-ras-EXON 2- MUTATED; MSI- STABLE.   DIAGNOSIS: colon ca  STAGE:  IV ;GOALS: pallaitive  CURRENT/MOST RECENT THERAPY: 5-FU plus Avastin    Cancer of ascending colon metastatic to intra-abdominal lymph node (HCC)    ALLERGIES:  has No Known Allergies.  MEDICATIONS:  Current Outpatient Medications  Medication Sig Dispense Refill  . acetaminophen (TYLENOL) 325 MG tablet Take 2 tablets (650 mg total) by mouth every 6 (six) hours as needed for mild pain (or Fever >/= 101).    .Marland KitchenamLODipine (NORVASC) 5 MG tablet Take 1 tablet (5 mg total) by mouth daily. 30 tablet 6  . Calcium Carbonate-Vitamin D (CALCIUM 600/VITAMIN D PO) Take 1 tablet by mouth every evening.     . diphenoxylate-atropine (LOMOTIL) 2.5-0.025 MG tablet Take 1 tablet by mouth 4 (four) times daily as needed for diarrhea or loose stools. Take it along with immodium 40 tablet 4  . dronabinol (MARINOL) 5 MG capsule Take 1 capsule (5 mg total) by mouth 2 (two) times daily before lunch and supper. 60 capsule 4  . DULoxetine (CYMBALTA) 20 MG capsule Take 2 capsules (40 mg total) by mouth daily. (Patient taking differently: Take 20 mg by mouth 2 (two) times daily. ) 180 capsule 3  . feeding supplement (BOOST HIGH PROTEIN) LIQD Take 1 Container by mouth daily.     . fentaNYL (DURAGESIC) 25 MCG/HR Place 1 patch onto the skin every 3 (three) days. 10 patch 0  . fentaNYL (DURAGESIC) 50 MCG/HR Place 1 patch onto the skin every 3 (three) days. 19 patch 0  . ferrous sulfate (FERROUSUL) 325 (65 FE) MG tablet Take 1 tablet (325 mg total) by mouth daily with breakfast. 90 tablet 3  . gabapentin (NEURONTIN) 300 MG capsule Take 1-2 capsules (300-600 mg total) by mouth See admin instructions. Take 1 capsule (300 mg) by mouth daily in the morning & take 2 capsules (600 mg) by mouth at night. 90 capsule 6  . HYDROcodone-acetaminophen (NORCO/VICODIN) 5-325 MG tablet Take 1 tablet by mouth every 8 (eight) hours  as needed (pain.). 90 tablet 0  . lidocaine-prilocaine (EMLA) cream Apply 1 application topically as needed. Apply generously over the Mediport 45 minutes prior to chemotherapy. (Patient taking differently: Apply 1 application topically as needed (prior to chemotherapy.). ) 30 g 6  . loperamide (IMODIUM) 2 MG capsule Take 2 mg by mouth 4 (four) times daily as needed for diarrhea or loose stools.     . Multiple Vitamin (MULTIVITAMIN WITH MINERALS) TABS tablet Take 1 tablet by mouth daily.    . ondansetron (ZOFRAN) 8 MG tablet One pill every 8 hours as needed for nausea/vomitting. (Patient taking differently: Take 8 mg by mouth every 8 (eight) hours as needed for nausea or vomiting. ) 60 tablet 0  . potassium chloride SA (K-DUR) 20 MEQ tablet Take 1 tablet (20 mEq total) by mouth 2 (two) times daily. 60 tablet 6  . prochlorperazine (COMPAZINE) 10 MG tablet Take 1 tablet (10 mg total) by mouth  every 6 (six) hours as needed for nausea or vomiting. 90 tablet 3   No current facility-administered medications for this visit.    Facility-Administered Medications Ordered in Other Visits  Medication Dose Route Frequency Provider Last Rate Last Dose  . fluorouracil (ADRUCIL) 2,900 mg in sodium chloride 0.9 % 92 mL chemo infusion  2,000 mg/m2 (Treatment Plan Recorded) Intravenous 1 day or 1 dose Lloyd Huger, MD   2,900 mg at 03/19/19 1331  . sodium chloride flush (NS) 0.9 % injection 10 mL  10 mL Intravenous PRN Cammie Sickle, MD   10 mL at 10/04/15 0901  . sodium chloride flush (NS) 0.9 % injection 10 mL  10 mL Intravenous PRN Cammie Sickle, MD   10 mL at 07/02/17 1020    VITAL SIGNS: LMP  (LMP Unknown) Comment: LMP MORE THAN 5 YRS There were no vitals filed for this visit.  Estimated body mass index is 16.44 kg/m as calculated from the following:   Height as of 03/05/19: 5' 2" (1.575 m).   Weight as of an earlier encounter on 03/19/19: 89 lb 14.4 oz (40.8 kg).  LABS: CBC:     Component Value Date/Time   WBC 5.1 03/19/2019 0900   HGB 9.2 (L) 03/19/2019 0900   HCT 29.4 (L) 03/19/2019 0900   PLT 123 (L) 03/19/2019 0900   MCV 97.0 03/19/2019 0900   NEUTROABS 3.4 03/19/2019 0900   LYMPHSABS 0.8 03/19/2019 0900   MONOABS 0.7 03/19/2019 0900   EOSABS 0.1 03/19/2019 0900   BASOSABS 0.0 03/19/2019 0900   Comprehensive Metabolic Panel:    Component Value Date/Time   NA 142 03/19/2019 0900   K 2.7 (LL) 03/19/2019 0900   CL 106 03/19/2019 0900   CO2 26 03/19/2019 0900   BUN 7 03/19/2019 0900   CREATININE 0.64 03/19/2019 0900   GLUCOSE 90 03/19/2019 0900   CALCIUM 8.3 (L) 03/19/2019 0900   AST 12 (L) 03/19/2019 0900   ALT 9 03/19/2019 0900   ALKPHOS 76 03/19/2019 0900   BILITOT 0.4 03/19/2019 0900   PROT 5.8 (L) 03/19/2019 0900   ALBUMIN 3.1 (L) 03/19/2019 0900    RADIOGRAPHIC STUDIES: Ct Head Wo Contrast  Result Date: 03/03/2019 CLINICAL DATA:  Concern for cerebral hemorrhage EXAM: CT HEAD WITHOUT CONTRAST TECHNIQUE: Contiguous axial images were obtained from the base of the skull through the vertex without intravenous contrast. COMPARISON:  Head CT dated 10/04/2004 and MR brain dated 12/04/2018 FINDINGS: Brain: No evidence of acute infarction, hemorrhage, hydrocephalus, extra-axial collection or mass lesion/mass effect. Vascular: No hyperdense vessel or unexpected calcification. Skull: Normal. Negative for fracture or focal lesion. Sinuses/Orbits: No acute finding. Other: None. IMPRESSION: No acute intracranial process. Electronically Signed   By: Zerita Boers M.D.   On: 03/03/2019 14:22   Ct Abdomen Pelvis W Contrast  Result Date: 02/24/2019 CLINICAL DATA:  Patient with metastatic colon cancer. Follow-up exam. EXAM: CT ABDOMEN AND PELVIS WITH CONTRAST TECHNIQUE: Multidetector CT imaging of the abdomen and pelvis was performed using the standard protocol following bolus administration of intravenous contrast. CONTRAST:  44m OMNIPAQUE IOHEXOL 300 MG/ML  SOLN  COMPARISON:  CT abdomen pelvis 11/01/2018 FINDINGS: Lower chest: Normal heart size. Dependent atelectasis left lower lobe. No pleural effusion. Hepatobiliary: Similar-appearing 2-4 mm low-attenuation lesions within the left and right hepatic lobes (image 10; series 2) (image 7; series 2). Fatty deposition adjacent to the falciform ligament. Redemonstrated post ablation cavity within the right hepatic lobe measuring 3.2 x 2.5 cm, previously 3.3  x 2.9 cm. Overall this is slightly decreased in size when compared to prior chest CT. Gallbladder is unremarkable. No intrahepatic or extrahepatic biliary ductal dilatation. Patchy heterogeneous low attenuation within the central liver left hepatic lobe may be perfusional in etiology. Pancreas: Unremarkable Spleen: Unremarkable Adrenals/Urinary Tract: Normal adrenal glands. Kidneys enhance symmetrically with contrast. Urinary bladder is unremarkable. Stomach/Bowel: Stool throughout the colon. Normal morphology of the stomach. No evidence for small bowel obstruction. Trace free fluid in the pelvis. No free intraperitoneal air. Vascular/Lymphatic: Tortuous yet normal caliber abdominal aorta with peripheral calcified atherosclerotic plaque. There is increased amorphous soft tissue within the central upper abdomen at the level of the celiac axis and superior mesenteric artery (image 16; series 2) measuring approximately 3.6 x 3.4 cm. There is soft tissue coursing along the celiac branches. There has been some residual soft tissue within this location suspected to represent prior treated disease. Reproductive: Unremarkable Other: None. Musculoskeletal: Lower thoracic and lumbar spine degenerative changes. Similar-appearing patchy sclerotic lesion involving the L2 vertebral body status post kyphoplasty. IMPRESSION: 1. Interval increase in soft tissue within the hepatic hilum at the level of the celiac axis and superior mesenteric artery which may represent recurrent/metastatic  disease. 2. Slight interval decrease in size of post ablation cavity within the right hepatic lobe. 3. Similar-appearing sclerotic lesion within the L2 vertebral body status post kyphoplasty. 4. Interval development small volume ascites. Electronically Signed   By: Lovey Newcomer M.D.   On: 02/24/2019 12:01   Ir Radiologist Eval & Mgmt  Result Date: 02/27/2019 Please refer to notes tab for details about interventional procedure. (Op Note)   PERFORMANCE STATUS (ECOG) : 1 - Symptomatic but completely ambulatory  Review of Systems Unless otherwise noted, a complete review of systems is negative.  Physical Exam General: NAD, frail appearing, thin Pulmonary: Unlabored Extremities: no edema, no joint deformities Skin: no rashes Neurological: Weakness but otherwise nonfocal  IMPRESSION: I met with patient in the infusion area.  Introduced palliative care services and attempted to establish therapeutic rapport.  Patient says she feels she is doing reasonably well.  She denies any acute changes or concerns today.  She also denies any distressing symptoms today.  Patient says that at baseline, she lives at home with her husband and son.  She is functionally independent with her own care.  She reports good social support and feels that she is coping well with her illness so far.  I introduced decision making and sent her home with a MOST Form to discuss with her family.  We will plan to complete during a future visit.  PLAN: -Continue current scope of treatment -MOST form reviewed -RTC in 2-3 weeks   Patient expressed understanding and was in agreement with this plan. She also understands that She can call the clinic at any time with any questions, concerns, or complaints.     Time Total: 30 minutes  Visit consisted of counseling and education dealing with the complex and emotionally intense issues of symptom management and palliative care in the setting of serious and potentially  life-threatening illness.Greater than 50%  of this time was spent counseling and coordinating care related to the above assessment and plan.  Signed by: Altha Harm, PhD, NP-C

## 2019-03-19 NOTE — Progress Notes (Signed)
Pt in for follow up, reports nurse called yesterday for pre assessment questions.  Denies any changes since conversation.  Pt request refill of BP medication through [facility].

## 2019-03-20 LAB — CEA: CEA: 29.3 ng/mL — ABNORMAL HIGH (ref 0.0–4.7)

## 2019-03-21 ENCOUNTER — Inpatient Hospital Stay: Payer: BC Managed Care – PPO

## 2019-03-21 ENCOUNTER — Other Ambulatory Visit: Payer: Self-pay

## 2019-03-21 VITALS — BP 159/77 | HR 74 | Temp 98.0°F | Resp 18

## 2019-03-21 DIAGNOSIS — C182 Malignant neoplasm of ascending colon: Secondary | ICD-10-CM

## 2019-03-21 DIAGNOSIS — C772 Secondary and unspecified malignant neoplasm of intra-abdominal lymph nodes: Secondary | ICD-10-CM

## 2019-03-21 DIAGNOSIS — C180 Malignant neoplasm of cecum: Secondary | ICD-10-CM | POA: Diagnosis not present

## 2019-03-21 MED ORDER — SODIUM CHLORIDE 0.9% FLUSH
10.0000 mL | INTRAVENOUS | Status: DC | PRN
  Administered 2019-03-21: 10 mL
  Filled 2019-03-21: qty 10

## 2019-03-21 MED ORDER — HEPARIN SOD (PORK) LOCK FLUSH 100 UNIT/ML IV SOLN
500.0000 [IU] | Freq: Once | INTRAVENOUS | Status: AC | PRN
  Administered 2019-03-21: 500 [IU]
  Filled 2019-03-21: qty 5

## 2019-04-01 ENCOUNTER — Other Ambulatory Visit: Payer: Self-pay

## 2019-04-01 ENCOUNTER — Encounter: Payer: Self-pay | Admitting: Internal Medicine

## 2019-04-01 NOTE — Progress Notes (Signed)
Patient pre screened for office appointment, no questions or concerns today. Patient reminded of upcoming appointment time and date. 

## 2019-04-02 ENCOUNTER — Inpatient Hospital Stay: Payer: BC Managed Care – PPO | Attending: Internal Medicine

## 2019-04-02 ENCOUNTER — Inpatient Hospital Stay (HOSPITAL_BASED_OUTPATIENT_CLINIC_OR_DEPARTMENT_OTHER): Payer: BC Managed Care – PPO | Admitting: Hospice and Palliative Medicine

## 2019-04-02 ENCOUNTER — Other Ambulatory Visit: Payer: Self-pay

## 2019-04-02 ENCOUNTER — Inpatient Hospital Stay: Payer: BC Managed Care – PPO

## 2019-04-02 ENCOUNTER — Other Ambulatory Visit: Payer: Self-pay | Admitting: *Deleted

## 2019-04-02 ENCOUNTER — Inpatient Hospital Stay (HOSPITAL_BASED_OUTPATIENT_CLINIC_OR_DEPARTMENT_OTHER): Payer: BC Managed Care – PPO | Admitting: Internal Medicine

## 2019-04-02 VITALS — BP 160/80 | HR 61

## 2019-04-02 DIAGNOSIS — C182 Malignant neoplasm of ascending colon: Secondary | ICD-10-CM | POA: Diagnosis not present

## 2019-04-02 DIAGNOSIS — Z7189 Other specified counseling: Secondary | ICD-10-CM

## 2019-04-02 DIAGNOSIS — Z79899 Other long term (current) drug therapy: Secondary | ICD-10-CM | POA: Diagnosis not present

## 2019-04-02 DIAGNOSIS — C772 Secondary and unspecified malignant neoplasm of intra-abdominal lymph nodes: Secondary | ICD-10-CM | POA: Diagnosis not present

## 2019-04-02 DIAGNOSIS — C7951 Secondary malignant neoplasm of bone: Secondary | ICD-10-CM | POA: Diagnosis not present

## 2019-04-02 DIAGNOSIS — Z5112 Encounter for antineoplastic immunotherapy: Secondary | ICD-10-CM | POA: Diagnosis not present

## 2019-04-02 DIAGNOSIS — E876 Hypokalemia: Secondary | ICD-10-CM | POA: Insufficient documentation

## 2019-04-02 DIAGNOSIS — Z5111 Encounter for antineoplastic chemotherapy: Secondary | ICD-10-CM | POA: Insufficient documentation

## 2019-04-02 DIAGNOSIS — Z515 Encounter for palliative care: Secondary | ICD-10-CM | POA: Diagnosis not present

## 2019-04-02 DIAGNOSIS — C180 Malignant neoplasm of cecum: Secondary | ICD-10-CM | POA: Insufficient documentation

## 2019-04-02 DIAGNOSIS — Z95828 Presence of other vascular implants and grafts: Secondary | ICD-10-CM

## 2019-04-02 LAB — URINALYSIS, COMPLETE (UACMP) WITH MICROSCOPIC
Bacteria, UA: NONE SEEN
Bilirubin Urine: NEGATIVE
Glucose, UA: NEGATIVE mg/dL
Hgb urine dipstick: NEGATIVE
Ketones, ur: NEGATIVE mg/dL
Leukocytes,Ua: NEGATIVE
Nitrite: NEGATIVE
Protein, ur: NEGATIVE mg/dL
Specific Gravity, Urine: 1.019 (ref 1.005–1.030)
pH: 5 (ref 5.0–8.0)

## 2019-04-02 LAB — CBC WITH DIFFERENTIAL/PLATELET
Abs Immature Granulocytes: 0.02 10*3/uL (ref 0.00–0.07)
Basophils Absolute: 0 10*3/uL (ref 0.0–0.1)
Basophils Relative: 0 %
Eosinophils Absolute: 0.1 10*3/uL (ref 0.0–0.5)
Eosinophils Relative: 1 %
HCT: 28.5 % — ABNORMAL LOW (ref 36.0–46.0)
Hemoglobin: 8.9 g/dL — ABNORMAL LOW (ref 12.0–15.0)
Immature Granulocytes: 0 %
Lymphocytes Relative: 16 %
Lymphs Abs: 0.9 10*3/uL (ref 0.7–4.0)
MCH: 30.1 pg (ref 26.0–34.0)
MCHC: 31.2 g/dL (ref 30.0–36.0)
MCV: 96.3 fL (ref 80.0–100.0)
Monocytes Absolute: 0.7 10*3/uL (ref 0.1–1.0)
Monocytes Relative: 13 %
Neutro Abs: 4 10*3/uL (ref 1.7–7.7)
Neutrophils Relative %: 70 %
Platelets: 117 10*3/uL — ABNORMAL LOW (ref 150–400)
RBC: 2.96 MIL/uL — ABNORMAL LOW (ref 3.87–5.11)
RDW: 18.4 % — ABNORMAL HIGH (ref 11.5–15.5)
WBC: 5.8 10*3/uL (ref 4.0–10.5)
nRBC: 0 % (ref 0.0–0.2)

## 2019-04-02 LAB — COMPREHENSIVE METABOLIC PANEL
ALT: 8 U/L (ref 0–44)
AST: 13 U/L — ABNORMAL LOW (ref 15–41)
Albumin: 2.8 g/dL — ABNORMAL LOW (ref 3.5–5.0)
Alkaline Phosphatase: 78 U/L (ref 38–126)
Anion gap: 5 (ref 5–15)
BUN: 7 mg/dL (ref 6–20)
CO2: 24 mmol/L (ref 22–32)
Calcium: 8 mg/dL — ABNORMAL LOW (ref 8.9–10.3)
Chloride: 110 mmol/L (ref 98–111)
Creatinine, Ser: 0.56 mg/dL (ref 0.44–1.00)
GFR calc Af Amer: >60 mL/min (ref >60)
GFR calc non Af Amer: >60 mL/min (ref >60)
Glucose, Bld: 104 mg/dL — ABNORMAL HIGH (ref 70–99)
Potassium: 2.8 mmol/L — ABNORMAL LOW (ref 3.5–5.1)
Sodium: 139 mmol/L (ref 135–145)
Total Bilirubin: 0.5 mg/dL (ref 0.3–1.2)
Total Protein: 5.6 g/dL — ABNORMAL LOW (ref 6.5–8.1)

## 2019-04-02 MED ORDER — ONDANSETRON HCL 4 MG/2ML IJ SOLN
8.0000 mg | Freq: Once | INTRAMUSCULAR | Status: AC
  Administered 2019-04-02: 8 mg via INTRAVENOUS
  Filled 2019-04-02: qty 4

## 2019-04-02 MED ORDER — SODIUM CHLORIDE 0.9 % IV SOLN
200.0000 mg | Freq: Once | INTRAVENOUS | Status: AC
  Administered 2019-04-02: 200 mg via INTRAVENOUS
  Filled 2019-04-02: qty 8

## 2019-04-02 MED ORDER — SODIUM CHLORIDE 0.9% FLUSH
10.0000 mL | Freq: Once | INTRAVENOUS | Status: AC
  Administered 2019-04-02: 10 mL via INTRAVENOUS
  Filled 2019-04-02: qty 10

## 2019-04-02 MED ORDER — SODIUM CHLORIDE 0.9 % IV SOLN
Freq: Once | INTRAVENOUS | Status: AC
  Administered 2019-04-02: 11:00:00 via INTRAVENOUS
  Filled 2019-04-02: qty 250

## 2019-04-02 MED ORDER — SODIUM CHLORIDE 0.9 % IV SOLN: 225.0000 mg | Freq: Once | INTRAVENOUS | Status: DC

## 2019-04-02 MED ORDER — SODIUM CHLORIDE 0.9 % IV SOLN
2000.0000 mg/m2 | INTRAVENOUS | Status: DC
  Administered 2019-04-02: 2650 mg via INTRAVENOUS
  Filled 2019-04-02: qty 50

## 2019-04-02 MED ORDER — SODIUM CHLORIDE 0.9 % IV SOLN: 2000.0000 mg/m2 | INTRAVENOUS | Status: DC

## 2019-04-02 MED ORDER — DEXAMETHASONE SODIUM PHOSPHATE 10 MG/ML IJ SOLN
10.0000 mg | Freq: Once | INTRAMUSCULAR | Status: AC
  Administered 2019-04-02: 10 mg via INTRAVENOUS
  Filled 2019-04-02: qty 1

## 2019-04-02 NOTE — Assessment & Plan Note (Addendum)
#   Colon cancer- cecal/ right-sided; STAGE IV; October 2 CT scan stable liver lesions post ablation; however approximately 3 cm mesenteric lesion noted/concern for progressive disease. On 5FU+ Avastin q 2 W.  #Proceed with 5-FU plus bevacizumab #3. Labs today reviewed;  acceptable for treatment today except for elevated blood pressure/see below.  Will monitor CEA closely.  If patient has continued progression of disease-would recommend the next option- opdivo + Lonsurf.   #Ongoing diarrhea-1-2 loose stools every day; continue Lomotil. STABLE.   # Abdominal discomfort/epigastric/ back pain - STABLE.   Sec to malignancy; fentany/ hydocodone- increase to TID. Continue current pain regimen.  #Hypertension- Currently on Norvasc 5 mg blood pressure 167/80- ok to proceed with Avastin. Bring log of BPs at next visit.  Stable  # hypokalemia- 2.8; sec to diarrhea/  Kdur to 20 TID; continue supp for now. Recent mag- N.  Stable  # Peripheral neuropathy  STABLE.   # DISPOSITION: [Thx giving] # 5FU-Avastin today; pump off in 2 days.  # follow up on dec 30th MD; labs- cbc/cmp/CEA; UA;5FU-Avastin; pump off 2 days later.

## 2019-04-02 NOTE — Patient Instructions (Signed)
#  Check blood pressure once a day ; and bring a log of blood pressures at the next visit.

## 2019-04-02 NOTE — Progress Notes (Signed)
Changed doses of 5-FU and Mvasi to reflect weight change per MD

## 2019-04-02 NOTE — Progress Notes (Signed)
[place] OFFICE PROGRESS NOTE  Patient Care Team: Patient, No Pcp Per as PCP - General (General Practice)  Cancer Staging No matching staging information was found for the patient.   Oncology History Overview Note  # [facility] 2017- COLON CANCER- STAGE IV [s/p right hemi-colectomy; pT4apN2 (7/19LN)]; Pre-op CEA- [id number]  PET-  mesenteric adenopathy/mediastinal adenopathy/right-sided subclavicular lymph node.    April 17th -START FOLFOX; May 1st Add Avastin;   Aug 16th- Disc OX [sec PN]; con 5FU-Avastin; NOV 27th CT C/A/P- CR; except for 24m LUL; 5FU-Avastin q 3 W [Poor tolerance to FOLFOX].  # June/July 2019- Progression of Liver/Abd LN; July 2019- START FOLFIRI + Avastin; STOP iri 02/25/2018- sec to diarrhea; cont Avastin + 5FU  # March 2018- L2 uptake MET s/p RT [[facility]2018]; mid April X-geva  # L1 ablation/ostecool-  Jan 3rd 2020-   # march 2nd 2020- decrease 5FU CIV  to 20013mm2/  #June 17th, 2020-liver ablation; 3.5 cm lesion.  Intolerance to 5-FU plus Avastin.  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 McQueen] CT-chest NEG; MRI brain negative.   # MOLECULAR TESTING: K-ras-EXON 2- MUTATED; MSI- STABLE.   DIAGNOSIS: colon ca  STAGE:  IV ;GOALS: pallaitive  CURRENT/MOST RECENT THERAPY: 5-FU plus Avastin    Cancer of ascending colon metastatic to intra-abdominal lymph node (HCC)    INTERVAL HISTORY:  Nancy CHANDRAN89.o.  female pleasant patient above history of metastatic adenocarcinoma of the colon-currently on 5-FU plus Avastin is here for follow-up.  Patient is currently status post 2 treatment so far.  Denies any significant nausea vomiting.  Abdominal pain is  stable.  She continues to use fentanyl patch/hydrocodone for pain.  Chronic mild diarrhea.  Patient states to be taking potassium up to 3 pills a day.  Appetite is fair.  Possible fatigue.   Review of Systems  Constitutional: Positive for malaise/fatigue. Negative for chills, diaphoresis and fever.  HENT: Negative for nosebleeds and sore throat.   Eyes: Negative for double vision.  Respiratory: Negative for cough, hemoptysis, sputum production, shortness of breath and wheezing.   Cardiovascular: Negative for chest pain, palpitations, orthopnea and leg swelling.  Gastrointestinal: Positive for abdominal pain and nausea. Negative for constipation, heartburn, melena and vomiting.  Genitourinary: Negative for dysuria, frequency and urgency.  Musculoskeletal: Positive for back pain. Negative for joint pain.  Skin: Negative.  Negative for itching and rash.  Neurological: Positive for tingling. Negative for dizziness, focal weakness, weakness and headaches.  Endo/Heme/Allergies: Does not bruise/bleed easily.  Psychiatric/Behavioral: Negative for depression. The patient is not nervous/anxious and does not have insomnia.       PAST MEDICAL HISTORY :  Past Medical History:  Diagnosis Date  . Chicken pox   . [place]   Partial colectomy 07/2015 + chemo tx's  . Hypertension   . Hypokalemia   . Menopause    > 5 yrs  . [place]   bi lat feet    PAST SURGICAL HISTORY :   Past Surgical History:  Procedure Laterality Date  . BREAST BIOPSY Right    2007? pt unsure done by [facility]  . COLONOSCOPY    . COLONOSCOPY WITH PROPOFOL N/A 10/14/2015   Procedure: COLONOSCOPY WITH PROPOFOL;  Surgeon: Hulen Luster, MD;  Location: [facility] ENDOSCOPY;  Service: Endoscopy;  Laterality: N/A;  . COLOSTOMY REVISION Right 08/09/2015   Procedure: COLON RESECTION RIGHT;  Surgeon: Florene Glen, MD;  Location: [facility] ORS;  Service: General;  Laterality: Right;  . ECTOPIC  PREGNANCY SURGERY    . IR BONE TUMOR(S)RF ABLATION  05/24/2018  . IR KYPHO LUMBAR INC FX REDUCE BONE BX UNI/BIL CANNULATION INC/IMAGING  05/24/2018  . IR RADIOLOGIST EVAL & MGMT  05/08/2018  . IR RADIOLOGIST EVAL & MGMT  06/18/2018  . IR RADIOLOGIST EVAL & MGMT  07/31/2018  . IR RADIOLOGIST EVAL & MGMT  12/19/2018  . IR RADIOLOGIST EVAL & MGMT  02/27/2019  . PORTACATH PLACEMENT N/A 09/01/2015   Procedure: INSERTION PORT-A-CATH;  Surgeon: Florene Glen, MD;  Location: [facility] ORS;  Service: General;  Laterality: N/A;  . RADIOLOGY WITH ANESTHESIA N/A 11/06/2018   Procedure: CT-MICROWAVE THERMAL ABLATION/LIVER;  Surgeon: Corrie Mckusick, DO;  Location: [facility] ORS;  Service: Anesthesiology;  Laterality: N/A;  . SHOULDER SURGERY Left     FAMILY HISTORY :   Family History  Problem Relation Age of Onset  . Hypertension Mother   . Arthritis Father   . Prostate cancer Father   . Diabetes Maternal Grandmother   . Colon cancer Maternal Grandfather        colon    SOCIAL HISTORY:   Social History   Tobacco Use  . Smoking status: Former Smoker    Packs/day: 0.25    Years: 2.00    Pack years: 0.50  . Smokeless tobacco: Never Used  . Tobacco comment: recently quit  Substance Use Topics  . Alcohol use: No    Alcohol/week: 0.0 standard drinks  . Drug use: No    ALLERGIES:  has No Known Allergies.  MEDICATIONS:  Current Outpatient Medications  Medication Sig Dispense Refill  . acetaminophen (TYLENOL) 325 MG tablet Take 2 tablets (650 mg total) by mouth every 6 (six) hours as needed for mild pain (or Fever >/= 101).    Marland Kitchen amLODipine (NORVASC) 5 MG tablet Take 1 tablet (5 mg total) by mouth daily. 30 tablet 6  . Calcium Carbonate-Vitamin D (CALCIUM 600/VITAMIN D PO) Take 1 tablet by mouth every evening.     . diphenoxylate-atropine (LOMOTIL) 2.5-0.025 MG tablet Take 1 tablet by mouth 4 (four) times daily as needed for diarrhea or loose stools. Take it along with immodium 40 tablet 4  . dronabinol  (MARINOL) 5 MG capsule Take 1 capsule (5 mg total) by mouth 2 (two) times daily before lunch and supper. 60 capsule 4  . DULoxetine (CYMBALTA) 20 MG capsule Take 2 capsules (40 mg total) by mouth daily. (Patient taking differently: Take 20 mg by mouth 2 (two) times daily. ) 180 capsule 3  . feeding supplement (BOOST HIGH PROTEIN) LIQD Take 1 Container by mouth daily.     . fentaNYL (DURAGESIC) 25 MCG/HR Place 1 patch onto the skin every 3 (three) days. 10 patch 0  . fentaNYL (DURAGESIC) 50 MCG/HR Place 1 patch onto the skin every 3 (three) days. 19 patch 0  . ferrous sulfate (FERROUSUL) 325 (65 FE) MG tablet Take 1 tablet (325 mg total) by mouth daily with breakfast. 90 tablet 3  . gabapentin (NEURONTIN) 300 MG capsule Take 1-2 capsules (300-600 mg total) by mouth See admin instructions. Take 1 capsule (300 mg) by mouth daily in the morning & take 2 capsules (600 mg) by mouth at night. 90 capsule 6  .  HYDROcodone-acetaminophen (NORCO/VICODIN) 5-325 MG tablet Take 1 tablet by mouth every 8 (eight) hours as needed (pain.). 90 tablet 0  . lidocaine-prilocaine (EMLA) cream Apply 1 application topically as needed. Apply generously over the Mediport 45 minutes prior to chemotherapy. (Patient taking differently: Apply 1 application topically as needed (prior to chemotherapy.). ) 30 g 6  . loperamide (IMODIUM) 2 MG capsule Take 2 mg by mouth 4 (four) times daily as needed for diarrhea or loose stools.     . Multiple Vitamin (MULTIVITAMIN WITH MINERALS) TABS tablet Take 1 tablet by mouth daily.    . ondansetron (ZOFRAN) 8 MG tablet One pill every 8 hours as needed for nausea/vomitting. (Patient taking differently: Take 8 mg by mouth every 8 (eight) hours as needed for nausea or vomiting. ) 60 tablet 0  . potassium chloride SA (K-DUR) 20 MEQ tablet Take 1 tablet (20 mEq total) by mouth 2 (two) times daily. 60 tablet 6  . prochlorperazine (COMPAZINE) 10 MG tablet Take 1 tablet (10 mg total) by mouth every 6 (six)  hours as needed for nausea or vomiting. 90 tablet 3   No current facility-administered medications for this visit.    Facility-Administered Medications Ordered in Other Visits  Medication Dose Route Frequency Provider Last Rate Last Dose  . sodium chloride flush (NS) 0.9 % injection 10 mL  10 mL Intravenous PRN Cammie Sickle, MD   10 mL at 10/04/15 0901  . sodium chloride flush (NS) 0.9 % injection 10 mL  10 mL Intravenous PRN Cammie Sickle, MD   10 mL at 07/02/17 1020    PHYSICAL EXAMINATION: ECOG PERFORMANCE STATUS: 1 - Symptomatic but completely ambulatory  BP (!) 167/80 (BP Location: Left Arm, Patient Position: Sitting, Cuff Size: Small)   Pulse 65   Temp (!) 96.4 F (35.8 C) (Tympanic)   Wt 89 lb (40.4 kg)   LMP  (LMP Unknown) Comment: LMP MORE THAN 5 YRS  BMI 16.28 kg/m   Filed Weights   04/02/19 0945  Weight: 89 lb (40.4 kg)    Physical Exam  Constitutional: She is oriented to person, place, and time and well-developed, well-nourished, and in no distress.  She is alone. She is walking herself.  HENT:  Head: Normocephalic and atraumatic.  Mouth/Throat: Oropharynx is clear and moist. No oropharyngeal exudate.  Eyes: Pupils are equal, round, and reactive to light.  Neck: Normal range of motion. Neck supple.  Cardiovascular: Normal rate and regular rhythm.  Pulmonary/Chest: Effort normal and breath sounds normal. No respiratory distress. She has no wheezes.  Abdominal: Soft. Bowel sounds are normal. She exhibits no distension and no mass. There is no abdominal tenderness. There is no rebound and no guarding.  Musculoskeletal: Normal range of motion.        General: No tenderness or edema.  Neurological: She is alert and oriented to person, place, and time.  Skin: Skin is warm.  Psychiatric: Affect normal.   LABORATORY DATA:  I have reviewed the data as listed    Component Value Date/Time   NA 139 04/02/2019 0929   K 2.8 (L) 04/02/2019 0929   CL 110  04/02/2019 0929   CO2 24 04/02/2019 0929   GLUCOSE 104 (H) 04/02/2019 0929   BUN 7 04/02/2019 0929   CREATININE 0.56 04/02/2019 0929   CALCIUM 8.0 (L) 04/02/2019 0929   PROT 5.6 (L) 04/02/2019 0929   ALBUMIN 2.8 (L) 04/02/2019 0929   AST 13 (L) 04/02/2019 0929   ALT 8 04/02/2019 0929  ALKPHOS 78 04/02/2019 0929   BILITOT 0.5 04/02/2019 0929   GFRNONAA >60 04/02/2019 0929   GFRAA >60 04/02/2019 0929    No results found for: SPEP, UPEP  Lab Results  Component Value Date   WBC 5.8 04/02/2019   NEUTROABS 4.0 04/02/2019   HGB 8.9 (L) 04/02/2019   HCT 28.5 (L) 04/02/2019   MCV 96.3 04/02/2019   PLT 117 (L) 04/02/2019      Chemistry      Component Value Date/Time   NA 139 04/02/2019 0929   K 2.8 (L) 04/02/2019 0929   CL 110 04/02/2019 0929   CO2 24 04/02/2019 0929   BUN 7 04/02/2019 0929   CREATININE 0.56 04/02/2019 0929      Component Value Date/Time   CALCIUM 8.0 (L) 04/02/2019 0929   ALKPHOS 78 04/02/2019 0929   AST 13 (L) 04/02/2019 0929   ALT 8 04/02/2019 0929   BILITOT 0.5 04/02/2019 0929       RADIOGRAPHIC STUDIES: I have personally reviewed the radiological images as listed and agreed with the findings in the report. No results found.   ASSESSMENT & PLAN:  Cancer of ascending colon metastatic to intra-abdominal lymph node [facility]) # Colon cancer- cecal/ right-sided; STAGE IV; October 2 CT scan stable liver lesions post ablation; however approximately 3 cm mesenteric lesion noted/concern for progressive disease. On 5FU+ Avastin q 2 W.  #Proceed with 5-FU plus bevacizumab #3. Labs today reviewed;  acceptable for treatment today except for elevated blood pressure/see below.  Will monitor CEA closely.  If patient has continued progression of disease-would recommend the next option- opdivo + Lonsurf.   #Ongoing diarrhea-1-2 loose stools every day; continue Lomotil. STABLE.   # Abdominal discomfort/epigastric/ back pain - STABLE.   Sec to malignancy; fentany/  hydocodone- increase to TID. Continue current pain regimen.  #Hypertension- Currently on Norvasc 5 mg blood pressure 167/80- ok to proceed with Avastin. Bring log of BPs at next visit.  Stable  # hypokalemia- 2.8; sec to diarrhea/  Kdur to 20 TID; continue supp for now. Recent mag- N.  Stable  # Peripheral neuropathy  STABLE.   # DISPOSITION: [Thx giving] # 5FU-Avastin today; pump off in 2 days.  # follow up on dec 30th MD; labs- cbc/cmp/CEA; UA;5FU-Avastin; pump off 2 days later.      Orders Placed This Encounter  Procedures  . CBC with Differential    Standing Status:   Future    Standing Expiration Date:   04/01/2020  . Comprehensive metabolic panel    Standing Status:   Future    Standing Expiration Date:   04/01/2020  . CEA    Standing Status:   Future    Standing Expiration Date:   04/01/2020  . Urinalysis, Complete w Microscopic    Standing Status:   Future    Standing Expiration Date:   04/01/2020   All questions were answered. The patient knows to call the clinic with any problems, questions or concerns.      Cammie Sickle, MD 04/02/2019 10:24 AM

## 2019-04-02 NOTE — Progress Notes (Signed)
[place]  [phone] [phone]   Name: Nancy Clark Date: 04/02/2019 MRN: [id number]  DOB: 11/09/1960  Patient Care Team: Patient, No Pcp Per as PCP - General (General Practice)    REASON FOR CONSULTATION: Palliative Care consult requested for this 58 y.o. female with multiple medical problems including stage IV colorectal cancer status post right hemicolectomy and XRT.  She is also status post liver ablation to area of hepatic metastasis.  Patient is currently being treated on systemic therapy with 5-FU plus bevacizumab .  She was referred to palliative care to help discuss goals and manage ongoing symptoms.   SOCIAL HISTORY:     reports that she has quit smoking. She has a 0.50 pack-year smoking history. She has never used smokeless tobacco. She reports that she does not drink alcohol or use drugs.  Patient is married and lives at home with her husband and son.  She has another son who lives in [place].  Patient previously worked at [employer] in environmental services.  ADVANCE DIRECTIVES:  Does not have  CODE STATUS:   PAST MEDICAL HISTORY: Past Medical History:  Diagnosis Date  . Chicken pox   . Colon cancer ([place])    Partial colectomy 07/2015 + chemo tx's  . Hypertension   . Hypokalemia   . Menopause    > 5 yrs  . Peripheral neuropathy due to chemotherapy ([place])    bi lat feet    PAST SURGICAL HISTORY:  Past Surgical History:  Procedure Laterality Date  . BREAST BIOPSY Right    2007? pt unsure done by [facility]  . COLONOSCOPY    . COLONOSCOPY WITH PROPOFOL N/A 10/14/2015   Procedure: COLONOSCOPY WITH PROPOFOL;  Surgeon: Hulen Luster, MD;  Location: [facility] ENDOSCOPY;  Service: Endoscopy;  Laterality: N/A;  . COLOSTOMY REVISION Right 08/09/2015   Procedure: COLON RESECTION RIGHT;  Surgeon: Florene Glen, MD;  Location: [facility] ORS;  Service: General;  Laterality: Right;  . ECTOPIC  PREGNANCY SURGERY    . IR BONE TUMOR(S)RF ABLATION  05/24/2018  . IR KYPHO LUMBAR INC FX REDUCE BONE BX UNI/BIL CANNULATION INC/IMAGING  05/24/2018  . IR RADIOLOGIST EVAL & MGMT  05/08/2018  . IR RADIOLOGIST EVAL & MGMT  06/18/2018  . IR RADIOLOGIST EVAL & MGMT  07/31/2018  . IR RADIOLOGIST EVAL & MGMT  12/19/2018  . IR RADIOLOGIST EVAL & MGMT  02/27/2019  . PORTACATH PLACEMENT N/A 09/01/2015   Procedure: INSERTION PORT-A-CATH;  Surgeon: Florene Glen, MD;  Location: [facility] ORS;  Service: General;  Laterality: N/A;  . RADIOLOGY WITH ANESTHESIA N/A 11/06/2018   Procedure: CT-MICROWAVE THERMAL ABLATION/LIVER;  Surgeon: Corrie Mckusick, DO;  Location: [facility] ORS;  Service: Anesthesiology;  Laterality: N/A;  . SHOULDER SURGERY Left     HEMATOLOGY/ONCOLOGY HISTORY:  Oncology History Overview Note  # FEB-MARCH 2017- COLON CANCER- STAGE IV [s/p right hemi-colectomy; pT4apN2 (7/19LN)]; Pre-op CEA- [id number]  PET-  mesenteric adenopathy/mediastinal adenopathy/right-sided subclavicular lymph node.    April 17th -START FOLFOX; May 1st Add Avastin;   Aug 16th- Disc OX [sec PN]; con 5FU-Avastin; NOV 27th CT C/A/P- CR; except for 29m LUL; 5FU-Avastin q 3 W [Poor tolerance to FOLFOX].  # June/July 2019- Progression of Liver/Abd LN; July 2019- START FOLFIRI + Avastin; STOP iri 02/25/2018- sec to diarrhea; cont Avastin + 5FU  # March 2018- L2 uptake MET s/p RT [[facility]2018]; mid April X-geva  # L1 ablation/ostecool-  Jan  3rd 2020-   # march 2nd 2020- decrease 5FU CIV  to 2065m/m2/  #June 17th, 2020-liver ablation; 3.5 cm lesion.  Intolerance to 5-FU plus Avastin.  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 McQueen] CT-chest NEG; MRI  brain negative.   # MOLECULAR TESTING: K-ras-EXON 2- MUTATED; MSI- STABLE.   DIAGNOSIS: colon ca  STAGE:  IV ;GOALS: pallaitive  CURRENT/MOST RECENT THERAPY: 5-FU plus Avastin    Cancer of ascending colon metastatic to intra-abdominal lymph node (HCC)    ALLERGIES:  has No Known Allergies.  MEDICATIONS:  Current Outpatient Medications  Medication Sig Dispense Refill  . acetaminophen (TYLENOL) 325 MG tablet Take 2 tablets (650 mg total) by mouth every 6 (six) hours as needed for mild pain (or Fever >/= 101).    .Marland KitchenamLODipine (NORVASC) 5 MG tablet Take 1 tablet (5 mg total) by mouth daily. 30 tablet 6  . Calcium Carbonate-Vitamin D (CALCIUM 600/VITAMIN D PO) Take 1 tablet by mouth every evening.     . diphenoxylate-atropine (LOMOTIL) 2.5-0.025 MG tablet Take 1 tablet by mouth 4 (four) times daily as needed for diarrhea or loose stools. Take it along with immodium 40 tablet 4  . dronabinol (MARINOL) 5 MG capsule Take 1 capsule (5 mg total) by mouth 2 (two) times daily before lunch and supper. 60 capsule 4  . DULoxetine (CYMBALTA) 20 MG capsule Take 2 capsules (40 mg total) by mouth daily. (Patient taking differently: Take 20 mg by mouth 2 (two) times daily. ) 180 capsule 3  . feeding supplement (BOOST HIGH PROTEIN) LIQD Take 1 Container by mouth daily.     . fentaNYL (DURAGESIC) 25 MCG/HR Place 1 patch onto the skin every 3 (three) days. 10 patch 0  . fentaNYL (DURAGESIC) 50 MCG/HR Place 1 patch onto the skin every 3 (three) days. 19 patch 0  . ferrous sulfate (FERROUSUL) 325 (65 FE) MG tablet Take 1 tablet (325 mg total) by mouth daily with breakfast. 90 tablet 3  . gabapentin (NEURONTIN) 300 MG capsule Take 1-2 capsules (300-600 mg total) by mouth See admin instructions. Take 1 capsule (300 mg) by mouth daily in the morning & take 2 capsules (600 mg) by mouth at night. 90 capsule 6  . HYDROcodone-acetaminophen (NORCO/VICODIN) 5-325 MG tablet Take 1 tablet by mouth every 8 (eight) hours  as needed (pain.). 90 tablet 0  . lidocaine-prilocaine (EMLA) cream Apply 1 application topically as needed. Apply generously over the Mediport 45 minutes prior to chemotherapy. (Patient taking differently: Apply 1 application topically as needed (prior to chemotherapy.). ) 30 g 6  . loperamide (IMODIUM) 2 MG capsule Take 2 mg by mouth 4 (four) times daily as needed for diarrhea or loose stools.     . Multiple Vitamin (MULTIVITAMIN WITH MINERALS) TABS tablet Take 1 tablet by mouth daily.    . ondansetron (ZOFRAN) 8 MG tablet One pill every 8 hours as needed for nausea/vomitting. (Patient taking differently: Take 8 mg by mouth every 8 (eight) hours as needed for nausea or vomiting. ) 60 tablet 0  . potassium chloride SA (K-DUR) 20 MEQ tablet Take 1 tablet (20 mEq total) by mouth 2 (two) times daily. 60 tablet 6  . prochlorperazine (COMPAZINE) 10 MG tablet Take 1 tablet (10 mg total) by mouth  every 6 (six) hours as needed for nausea or vomiting. 90 tablet 3   No current facility-administered medications for this visit.    Facility-Administered Medications Ordered in Other Visits  Medication Dose Route Frequency Provider Last Rate Last Dose  . sodium chloride flush (NS) 0.9 % injection 10 mL  10 mL Intravenous PRN Cammie Sickle, MD   10 mL at 10/04/15 0901  . sodium chloride flush (NS) 0.9 % injection 10 mL  10 mL Intravenous PRN Cammie Sickle, MD   10 mL at 07/02/17 1020    VITAL SIGNS: LMP  (LMP Unknown) Comment: LMP MORE THAN 5 YRS There were no vitals filed for this visit.  Estimated body mass index is 16.28 kg/m as calculated from the following:   Height as of 03/05/19: _0  (1.575 m).   Weight as of an earlier encounter on 04/02/19: 89 lb (40.4 kg).  LABS: CBC:    Component Value Date/Time   WBC 5.8 04/02/2019 0929   HGB 8.9 (L) 04/02/2019 0929   HCT 28.5 (L) 04/02/2019 0929   PLT 117 (L) 04/02/2019 0929   MCV 96.3 04/02/2019 0929   NEUTROABS 4.0 04/02/2019 0929    LYMPHSABS 0.9 04/02/2019 0929   MONOABS 0.7 04/02/2019 0929   EOSABS 0.1 04/02/2019 0929   BASOSABS 0.0 04/02/2019 0929   Comprehensive Metabolic Panel:    Component Value Date/Time   NA 139 04/02/2019 0929   K 2.8 (L) 04/02/2019 0929   CL 110 04/02/2019 0929   CO2 24 04/02/2019 0929   BUN 7 04/02/2019 0929   CREATININE 0.56 04/02/2019 0929   GLUCOSE 104 (H) 04/02/2019 0929   CALCIUM 8.0 (L) 04/02/2019 0929   AST 13 (L) 04/02/2019 0929   ALT 8 04/02/2019 0929   ALKPHOS 78 04/02/2019 0929   BILITOT 0.5 04/02/2019 0929   PROT 5.6 (L) 04/02/2019 0929   ALBUMIN 2.8 (L) 04/02/2019 0929    RADIOGRAPHIC STUDIES: Ct Head Wo Contrast  Result Date: 03/03/2019 CLINICAL DATA:  Concern for cerebral hemorrhage EXAM: CT HEAD WITHOUT CONTRAST TECHNIQUE: Contiguous axial images were obtained from the base of the skull through the vertex without intravenous contrast. COMPARISON:  Head CT dated 10/04/2004 and MR brain dated 12/04/2018 FINDINGS: Brain: No evidence of acute infarction, hemorrhage, hydrocephalus, extra-axial collection or mass lesion/mass effect. Vascular: No hyperdense vessel or unexpected calcification. Skull: Normal. Negative for fracture or focal lesion. Sinuses/Orbits: No acute finding. Other: None. IMPRESSION: No acute intracranial process. Electronically Signed   By: Zerita Boers M.D.   On: 03/03/2019 14:22    PERFORMANCE STATUS (ECOG) : 1 - Symptomatic but completely ambulatory  Review of Systems Unless otherwise noted, a complete review of systems is negative.  Physical Exam General: NAD, frail appearing, thin Pulmonary: Unlabored Extremities: no edema, no joint deformities Skin: no rashes Neurological: Weakness but otherwise nonfocal  IMPRESSION: Routine follow-up visit made today.  Patient was seen in the infusion area.  Patient reports that she is doing reasonably well.  She denies any acute changes or concerns.  She denies any distressing symptoms today.  She  reports good oral intake.  Patient is underweight but weight trends have been stable over the past month.  Encouraged high caloric foods.  Patient is drinking oral supplements.  She has been followed by our dietitian.  Patient says that she is still thinking about the MOST Form.  PLAN: -Continue current scope of treatment -MOST form reviewed -Follow-up telephone visit in 3 to 4 weeks.  Patient expressed understanding and was in agreement with this plan. She also understands that She can call the clinic at any time with any questions, concerns, or complaints.     Time Total: 10 minutes  Visit consisted of counseling and education dealing with the complex and emotionally intense issues of symptom management and palliative care in the setting of serious and potentially life-threatening illness.Greater than 50%  of this time was spent counseling and coordinating care related to the above assessment and plan.  Signed by: Altha Harm, PhD, NP-C

## 2019-04-04 ENCOUNTER — Inpatient Hospital Stay: Payer: BC Managed Care – PPO

## 2019-04-04 ENCOUNTER — Other Ambulatory Visit: Payer: Self-pay

## 2019-04-04 DIAGNOSIS — C772 Secondary and unspecified malignant neoplasm of intra-abdominal lymph nodes: Secondary | ICD-10-CM

## 2019-04-04 DIAGNOSIS — C182 Malignant neoplasm of ascending colon: Secondary | ICD-10-CM

## 2019-04-04 DIAGNOSIS — C180 Malignant neoplasm of cecum: Secondary | ICD-10-CM | POA: Diagnosis not present

## 2019-04-04 MED ORDER — HEPARIN SOD (PORK) LOCK FLUSH 100 UNIT/ML IV SOLN
500.0000 [IU] | Freq: Once | INTRAVENOUS | Status: AC | PRN
  Administered 2019-04-04: 13:00:00 500 [IU]

## 2019-04-04 MED ORDER — SODIUM CHLORIDE 0.9% FLUSH
10.0000 mL | INTRAVENOUS | Status: DC | PRN
  Administered 2019-04-04: 10 mL
  Filled 2019-04-04: qty 10

## 2019-04-16 ENCOUNTER — Other Ambulatory Visit: Payer: Self-pay | Admitting: *Deleted

## 2019-04-16 MED ORDER — HYDROCODONE-ACETAMINOPHEN 5-325 MG PO TABS: 1.0000 | ORAL_TABLET | Freq: Three times a day (TID) | ORAL | 0 refills | Status: DC | PRN

## 2019-04-16 MED ORDER — FENTANYL 50 MCG/HR TD PT72: 1.0000 | MEDICATED_PATCH | TRANSDERMAL | 0 refills | Status: DC

## 2019-04-16 NOTE — Telephone Encounter (Signed)
Patient called to request a RF on fentanyl patch and norco before Monday - if possible

## 2019-04-21 ENCOUNTER — Other Ambulatory Visit: Payer: Self-pay

## 2019-04-21 ENCOUNTER — Inpatient Hospital Stay (HOSPITAL_BASED_OUTPATIENT_CLINIC_OR_DEPARTMENT_OTHER): Payer: BC Managed Care – PPO | Admitting: Internal Medicine

## 2019-04-21 ENCOUNTER — Inpatient Hospital Stay: Payer: BC Managed Care – PPO

## 2019-04-21 DIAGNOSIS — C7951 Secondary malignant neoplasm of bone: Secondary | ICD-10-CM

## 2019-04-21 DIAGNOSIS — Z95828 Presence of other vascular implants and grafts: Secondary | ICD-10-CM

## 2019-04-21 DIAGNOSIS — C182 Malignant neoplasm of ascending colon: Secondary | ICD-10-CM

## 2019-04-21 DIAGNOSIS — C180 Malignant neoplasm of cecum: Secondary | ICD-10-CM | POA: Diagnosis not present

## 2019-04-21 DIAGNOSIS — C772 Secondary and unspecified malignant neoplasm of intra-abdominal lymph nodes: Secondary | ICD-10-CM

## 2019-04-21 DIAGNOSIS — R197 Diarrhea, unspecified: Secondary | ICD-10-CM | POA: Diagnosis not present

## 2019-04-21 LAB — CBC WITH DIFFERENTIAL/PLATELET
Abs Immature Granulocytes: 0.01 10*3/uL (ref 0.00–0.07)
Basophils Absolute: 0 10*3/uL (ref 0.0–0.1)
Basophils Relative: 0 %
Eosinophils Absolute: 0.1 10*3/uL (ref 0.0–0.5)
Eosinophils Relative: 2 %
HCT: 30.5 % — ABNORMAL LOW (ref 36.0–46.0)
Hemoglobin: 9.2 g/dL — ABNORMAL LOW (ref 12.0–15.0)
Immature Granulocytes: 0 %
Lymphocytes Relative: 20 %
Lymphs Abs: 1 10*3/uL (ref 0.7–4.0)
MCH: 28.6 pg (ref 26.0–34.0)
MCHC: 30.2 g/dL (ref 30.0–36.0)
MCV: 94.7 fL (ref 80.0–100.0)
Monocytes Absolute: 0.7 10*3/uL (ref 0.1–1.0)
Monocytes Relative: 14 %
Neutro Abs: 3.2 10*3/uL (ref 1.7–7.7)
Neutrophils Relative %: 64 %
Platelets: 118 10*3/uL — ABNORMAL LOW (ref 150–400)
RBC: 3.22 MIL/uL — ABNORMAL LOW (ref 3.87–5.11)
RDW: 18.1 % — ABNORMAL HIGH (ref 11.5–15.5)
WBC: 5 10*3/uL (ref 4.0–10.5)
nRBC: 0 % (ref 0.0–0.2)

## 2019-04-21 LAB — URINALYSIS, COMPLETE (UACMP) WITH MICROSCOPIC
Bacteria, UA: NONE SEEN
Bilirubin Urine: NEGATIVE
Glucose, UA: NEGATIVE mg/dL
Hgb urine dipstick: NEGATIVE
Ketones, ur: NEGATIVE mg/dL
Leukocytes,Ua: NEGATIVE
Nitrite: NEGATIVE
Protein, ur: NEGATIVE mg/dL
Specific Gravity, Urine: 1.013 (ref 1.005–1.030)
pH: 6 (ref 5.0–8.0)

## 2019-04-21 LAB — COMPREHENSIVE METABOLIC PANEL
ALT: 23 U/L (ref 0–44)
AST: 50 U/L — ABNORMAL HIGH (ref 15–41)
Albumin: 3 g/dL — ABNORMAL LOW (ref 3.5–5.0)
Alkaline Phosphatase: 214 U/L — ABNORMAL HIGH (ref 38–126)
Anion gap: 8 (ref 5–15)
BUN: 6 mg/dL (ref 6–20)
CO2: 27 mmol/L (ref 22–32)
Calcium: 7.9 mg/dL — ABNORMAL LOW (ref 8.9–10.3)
Chloride: 101 mmol/L (ref 98–111)
Creatinine, Ser: 0.73 mg/dL (ref 0.44–1.00)
GFR calc Af Amer: >60 mL/min (ref >60)
GFR calc non Af Amer: >60 mL/min (ref >60)
Glucose, Bld: 97 mg/dL (ref 70–99)
Potassium: 2.2 mmol/L — CL (ref 3.5–5.1)
Sodium: 136 mmol/L (ref 135–145)
Total Bilirubin: 0.9 mg/dL (ref 0.3–1.2)
Total Protein: 6.1 g/dL — ABNORMAL LOW (ref 6.5–8.1)

## 2019-04-21 LAB — MAGNESIUM: Magnesium: 1.4 mg/dL — ABNORMAL LOW (ref 1.7–2.4)

## 2019-04-21 MED ORDER — DIPHENOXYLATE-ATROPINE 2.5-0.025 MG PO TABS: 1.0000 | ORAL_TABLET | Freq: Four times a day (QID) | ORAL | 4 refills | Status: AC | PRN

## 2019-04-21 MED ORDER — SODIUM CHLORIDE 0.9 % IV SOLN
2650.0000 mg | INTRAVENOUS | Status: DC
  Administered 2019-04-21: 2650 mg via INTRAVENOUS
  Filled 2019-04-21: qty 50

## 2019-04-21 MED ORDER — POTASSIUM CHLORIDE 20 MEQ/100ML IV SOLN
20.0000 meq | Freq: Once | INTRAVENOUS | Status: AC
  Administered 2019-04-21: 20 meq via INTRAVENOUS

## 2019-04-21 MED ORDER — DEXAMETHASONE SODIUM PHOSPHATE 10 MG/ML IJ SOLN
10.0000 mg | Freq: Once | INTRAMUSCULAR | Status: AC
  Administered 2019-04-21: 10 mg via INTRAVENOUS
  Filled 2019-04-21: qty 1

## 2019-04-21 MED ORDER — SODIUM CHLORIDE 0.9 % IV SOLN: 2000.0000 mg/m2 | INTRAVENOUS | Status: DC

## 2019-04-21 MED ORDER — SODIUM CHLORIDE 0.9 % IV SOLN
Freq: Once | INTRAVENOUS | Status: AC
  Administered 2019-04-21: 11:00:00 via INTRAVENOUS
  Filled 2019-04-21: qty 250

## 2019-04-21 MED ORDER — ATROPINE SULFATE 1 MG/ML IJ SOLN: 0.5000 mg | Freq: Once | INTRAMUSCULAR | Status: DC | PRN

## 2019-04-21 MED ORDER — SODIUM CHLORIDE 0.9 % IV SOLN
200.0000 mg | Freq: Once | INTRAVENOUS | Status: AC
  Administered 2019-04-21: 200 mg via INTRAVENOUS
  Filled 2019-04-21: qty 8

## 2019-04-21 MED ORDER — SODIUM CHLORIDE 0.9% FLUSH
10.0000 mL | Freq: Once | INTRAVENOUS | Status: AC
  Administered 2019-04-21: 10 mL via INTRAVENOUS
  Filled 2019-04-21: qty 10

## 2019-04-21 MED ORDER — ONDANSETRON HCL 4 MG/2ML IJ SOLN
8.0000 mg | Freq: Once | INTRAMUSCULAR | Status: AC
  Administered 2019-04-21: 8 mg via INTRAVENOUS
  Filled 2019-04-21: qty 4

## 2019-04-21 NOTE — Assessment & Plan Note (Addendum)
#   Colon cancer- cecal/ right-sided; STAGE IV; October 2nd 2020- CT scan stable liver lesions post ablation; however approximately 3 cm mesenteric lesion noted/concern for progressive disease. On 5FU+ Avastin q 2 W.  # Proceed with 5-FU plus bevacizumab #4. Labs today reviewed;  acceptable for treatment today.  Would recommend a CT scan after this cycle-especially as CEA is rising.  If patient has progressive disease-the next therapy treatment option would be Opdivo plus Rego.   #Ongoing diarrhea-2-3 loose stools/ day; continue Lomotil-refilled; continue imodium; STABLE.   # Abdominal discomfort/epigastric/ back pain -Sec to malignancy; fentany/ hydocodone TID. STABLE. Continue current pain regimen.  #Hypertension- Currently on Norvasc 5 mg blood pressure 159/80- ok to proceed with Avastin.  Stable.  # hypokalemia- 2.2; sec to diarrhea/  Kdur to 20 TID; continue supp for now. Recent mag- N.  Add IB KCL today.   # Peripheral neuropathy  Stable.   # DISPOSITION: check Mag today # KCL 20 meq today # 5FU-Avastin today; pump off in 2 days.  # in 1 week- lab-bmp; possible IV Kcl  # follow up in 2 weeks; labs- cbc/cmp/CEA; UA; 5FU-Avastin; IV KCL 20 meq; pump off 2 days later; CT scan  prior-Dr.B

## 2019-04-21 NOTE — Addendum Note (Signed)
Addended by: Oneida Arenas on: 04/21/2019 11:43 AM   Modules accepted: Orders

## 2019-04-21 NOTE — Progress Notes (Signed)
[name]  Patient Care Team: Patient, No Pcp Per as PCP - General (General Practice)  Cancer Staging No matching staging information was found for the patient.   Oncology History Overview Note  # [facility] 2017- COLON CANCER- STAGE IV [s/p right hemi-colectomy; pT4apN2 (7/19LN)]; Pre-op CEA- [id number]  PET-  mesenteric adenopathy/mediastinal adenopathy/right-sided subclavicular lymph node.    April 17th -START FOLFOX; May 1st Add Avastin;   Aug 16th- Disc OX [sec PN]; con 5FU-Avastin; NOV 27th CT C/A/P- CR; except for 42m LUL; 5FU-Avastin q 3 W [Poor tolerance to FOLFOX].  # June/July 2019- Progression of Liver/Abd LN; July 2019- START FOLFIRI + Avastin; STOP iri 02/25/2018- sec to diarrhea; cont Avastin + 5FU  # March 2018- L2 uptake MET s/p RT [[facility]]; mid April X-geva  # L1 ablation/ostecool-  Jan 3rd 2020-   # march 2nd 2020- decrease 5FU CIV  to 20027mm2/  #June 17th, 2020-liver ablation; 3.5 cm lesion.  Intolerance to 5-FU plus Avastin.  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 McQueen] CT-chest NEG; MRI brain negative.   # MOLECULAR TESTING: K-ras-EXON 2- MUTATED; MSI- STABLE.   DIAGNOSIS: colon ca  STAGE:  IV ;GOALS: pallaitive  CURRENT/MOST RECENT THERAPY: 5-FU plus Avastin    Cancer of ascending colon metastatic to intra-abdominal lymph node (HCC)    INTERVAL HISTORY:  MaROBYNN [id number].  female pleasant patient above history of metastatic adenocarcinoma of the colon-currently on 5-FU plus Avastin is here for follow-up.  Patient is currently status post 3 treatments.  Denies any significant nausea vomiting.  Abdominal pain is stable.   She continues to use fentanyl patch and hydrocodone for pain medication.  She continues to have mild to moderate diarrhea 2-3 loose stools every other day.  She is states to be taking 3 potassium pills.  She complains of cramping in the legs.   Review of Systems  Constitutional: Positive for malaise/fatigue. Negative for chills, diaphoresis and fever.  HENT: Negative for nosebleeds and sore throat.   Eyes: Negative for double vision.  Respiratory: Negative for cough, hemoptysis, sputum production, shortness of breath and wheezing.   Cardiovascular: Negative for chest pain, palpitations, orthopnea and leg swelling.  Gastrointestinal: Positive for abdominal pain and nausea. Negative for constipation, heartburn, melena and vomiting.  Genitourinary: Negative for dysuria, frequency and urgency.  Musculoskeletal: Positive for back pain. Negative for joint pain.  Skin: Negative.  Negative for itching and rash.  Neurological: Positive for tingling. Negative for dizziness, focal weakness, weakness and headaches.  Endo/Heme/Allergies: Does not bruise/bleed easily.  Psychiatric/Behavioral: Negative for depression. The patient is not nervous/anxious and does not have insomnia.       PAST MEDICAL HISTORY :  Past Medical History:  Diagnosis Date  . Chicken pox   . [place]   Partial colectomy 07/2015 + chemo tx's  . Hypertension   . Hypokalemia   . Menopause    > 5 yrs  . [place]   bi lat feet    PAST SURGICAL HISTORY :   Past Surgical History:  Procedure Laterality Date  . BREAST BIOPSY Right    2007? pt unsure done by [facility]  . COLONOSCOPY    . COLONOSCOPY  WITH PROPOFOL N/A 10/14/2015   Procedure: COLONOSCOPY WITH PROPOFOL;  Surgeon: Hulen Luster, MD;  Location: [facility] ENDOSCOPY;  Service: Endoscopy;  Laterality: N/A;  . COLOSTOMY REVISION Right 08/09/2015   Procedure: COLON RESECTION RIGHT;  Surgeon: Florene Glen, MD;  Location: [facility] ORS;   Service: General;  Laterality: Right;  . ECTOPIC PREGNANCY SURGERY    . IR BONE TUMOR(S)RF ABLATION  05/24/2018  . IR KYPHO LUMBAR INC FX REDUCE BONE BX UNI/BIL CANNULATION INC/IMAGING  05/24/2018  . IR RADIOLOGIST EVAL & MGMT  05/08/2018  . IR RADIOLOGIST EVAL & MGMT  06/18/2018  . IR RADIOLOGIST EVAL & MGMT  07/31/2018  . IR RADIOLOGIST EVAL & MGMT  12/19/2018  . IR RADIOLOGIST EVAL & MGMT  02/27/2019  . PORTACATH PLACEMENT N/A 09/01/2015   Procedure: INSERTION PORT-A-CATH;  Surgeon: Florene Glen, MD;  Location: [facility] ORS;  Service: General;  Laterality: N/A;  . RADIOLOGY WITH ANESTHESIA N/A 11/06/2018   Procedure: CT-MICROWAVE THERMAL ABLATION/LIVER;  Surgeon: Corrie Mckusick, DO;  Location: [facility] ORS;  Service: Anesthesiology;  Laterality: N/A;  . SHOULDER SURGERY Left     FAMILY HISTORY :   Family History  Problem Relation Age of Onset  . Hypertension Mother   . Arthritis Father   . Prostate cancer Father   . Diabetes Maternal Grandmother   . Colon cancer Maternal Grandfather        colon    SOCIAL HISTORY:   Social History   Tobacco Use  . Smoking status: Former Smoker    Packs/day: 0.25    Years: 2.00    Pack years: 0.50  . Smokeless tobacco: Never Used  . Tobacco comment: recently quit  Substance Use Topics  . Alcohol use: No    Alcohol/week: 0.0 standard drinks  . Drug use: No    ALLERGIES:  has No Known Allergies.  MEDICATIONS:  Current Outpatient Medications  Medication Sig Dispense Refill  . acetaminophen (TYLENOL) 325 MG tablet Take 2 tablets (650 mg total) by mouth every 6 (six) hours as needed for mild pain (or Fever >/= 101).    Marland Kitchen amLODipine (NORVASC) 5 MG tablet Take 1 tablet (5 mg total) by mouth daily. 30 tablet 6  . Calcium Carbonate-Vitamin D (CALCIUM 600/VITAMIN D PO) Take 1 tablet by mouth every evening.     . diphenoxylate-atropine (LOMOTIL) 2.5-0.025 MG tablet Take 1 tablet by mouth 4 (four) times daily as needed for diarrhea or loose stools. Take it  along with immodium 60 tablet 4  . dronabinol (MARINOL) 5 MG capsule Take 1 capsule (5 mg total) by mouth 2 (two) times daily before lunch and supper. 60 capsule 4  . DULoxetine (CYMBALTA) 20 MG capsule Take 2 capsules (40 mg total) by mouth daily. (Patient taking differently: Take 20 mg by mouth 2 (two) times daily. ) 180 capsule 3  . feeding supplement (BOOST HIGH PROTEIN) LIQD Take 1 Container by mouth daily.     Marland Kitchen gabapentin (NEURONTIN) 300 MG capsule Take 1-2 capsules (300-600 mg total) by mouth See admin instructions. Take 1 capsule (300 mg) by mouth daily in the morning & take 2 capsules (600 mg) by mouth at night. 90 capsule 6  . lidocaine-prilocaine (EMLA) cream Apply 1 application topically as needed. Apply generously over the Mediport 45 minutes prior to chemotherapy. (Patient taking differently: Apply 1 application topically as needed (prior to chemotherapy.). ) 30 g 6  . loperamide (IMODIUM) 2 MG capsule Take 2 mg by mouth 4 (four) times  daily as needed for diarrhea or loose stools.     . Multiple Vitamin (MULTIVITAMIN WITH MINERALS) TABS tablet Take 1 tablet by mouth daily.    . ondansetron (ZOFRAN) 8 MG tablet One pill every 8 hours as needed for nausea/vomitting. (Patient taking differently: Take 8 mg by mouth every 8 (eight) hours as needed for nausea or vomiting. ) 60 tablet 0  . potassium chloride SA (K-DUR) 20 MEQ tablet Take 1 tablet (20 mEq total) by mouth 2 (two) times daily. 60 tablet 6  . prochlorperazine (COMPAZINE) 10 MG tablet Take 1 tablet (10 mg total) by mouth every 6 (six) hours as needed for nausea or vomiting. 90 tablet 3  . fentaNYL (DURAGESIC) 50 MCG/HR Place 1 patch onto the skin every 3 (three) days. 10 patch 0  . ferrous sulfate (FERROUSUL) 325 (65 FE) MG tablet Take 1 tablet (325 mg total) by mouth daily with breakfast. (Patient not taking: Reported on 04/16/2019) 90 tablet 3  . HYDROcodone-acetaminophen (NORCO/VICODIN) 5-325 MG tablet Take 1 tablet by mouth every  8 (eight) hours as needed (pain.). 90 tablet 0   No current facility-administered medications for this visit.    Facility-Administered Medications Ordered in Other Visits  Medication Dose Route Frequency Provider Last Rate Last Dose  . fluorouracil (ADRUCIL) 2,650 mg in sodium chloride 0.9 % 97 mL chemo infusion  2,650 mg Intravenous 1 day or 1 dose Charlaine Dalton R, MD   2,650 mg at 04/21/19 1235  . sodium chloride flush (NS) 0.9 % injection 10 mL  10 mL Intravenous PRN Cammie Sickle, MD   10 mL at 10/04/15 0901  . sodium chloride flush (NS) 0.9 % injection 10 mL  10 mL Intravenous PRN Cammie Sickle, MD   10 mL at 07/02/17 1020    PHYSICAL EXAMINATION: ECOG PERFORMANCE STATUS: 1 - Symptomatic but completely ambulatory  BP (!) 159/80 (BP Location: Left Arm, Patient Position: Sitting, Cuff Size: Small)   Pulse 60   Temp (!) 95.6 F (35.3 C) (Tympanic)   Wt 87 lb 8 oz (39.7 kg)   LMP  (LMP Unknown) Comment: LMP MORE THAN 5 YRS  BMI 16.00 kg/m   Filed Weights   04/21/19 1011  Weight: 87 lb 8 oz (39.7 kg)    Physical Exam  Constitutional: She is oriented to person, place, and time and well-developed, well-nourished, and in no distress.  She is alone. She is walking herself.  HENT:  Head: Normocephalic and atraumatic.  Mouth/Throat: Oropharynx is clear and moist. No oropharyngeal exudate.  Eyes: Pupils are equal, round, and reactive to light.  Neck: Normal range of motion. Neck supple.  Cardiovascular: Normal rate and regular rhythm.  Pulmonary/Chest: Effort normal and breath sounds normal. No respiratory distress. She has no wheezes.  Abdominal: Soft. Bowel sounds are normal. She exhibits no distension and no mass. There is no abdominal tenderness. There is no rebound and no guarding.  Musculoskeletal: Normal range of motion.        General: No tenderness or edema.  Neurological: She is alert and oriented to person, place, and time.  Skin: Skin is warm.   Psychiatric: Affect normal.   LABORATORY DATA:  I have reviewed the data as listed    Component Value Date/Time   NA 136 04/21/2019 0947   K 2.2 (LL) 04/21/2019 0947   CL 101 04/21/2019 0947   CO2 27 04/21/2019 0947   GLUCOSE 97 04/21/2019 0947   BUN 6 04/21/2019 0947   CREATININE  0.73 04/21/2019 0947   CALCIUM 7.9 (L) 04/21/2019 0947   PROT 6.1 (L) 04/21/2019 0947   ALBUMIN 3.0 (L) 04/21/2019 0947   AST 50 (H) 04/21/2019 0947   ALT 23 04/21/2019 0947   ALKPHOS 214 (H) 04/21/2019 0947   BILITOT 0.9 04/21/2019 0947   GFRNONAA >60 04/21/2019 0947   GFRAA >60 04/21/2019 0947    No results found for: SPEP, UPEP  Lab Results  Component Value Date   WBC 5.0 04/21/2019   NEUTROABS 3.2 04/21/2019   HGB 9.2 (L) 04/21/2019   HCT 30.5 (L) 04/21/2019   MCV 94.7 04/21/2019   PLT 118 (L) 04/21/2019      Chemistry      Component Value Date/Time   NA 136 04/21/2019 0947   K 2.2 (LL) 04/21/2019 0947   CL 101 04/21/2019 0947   CO2 27 04/21/2019 0947   BUN 6 04/21/2019 0947   CREATININE 0.73 04/21/2019 0947      Component Value Date/Time   CALCIUM 7.9 (L) 04/21/2019 0947   ALKPHOS 214 (H) 04/21/2019 0947   AST 50 (H) 04/21/2019 0947   ALT 23 04/21/2019 0947   BILITOT 0.9 04/21/2019 0947       RADIOGRAPHIC STUDIES: I have personally reviewed the radiological images as listed and agreed with the findings in the report. No results found.   ASSESSMENT & PLAN:  Cancer of ascending colon metastatic to intra-abdominal lymph node [facility]) # Colon cancer- cecal/ right-sided; STAGE IV; October 2nd 2020- CT scan stable liver lesions post ablation; however approximately 3 cm mesenteric lesion noted/concern for progressive disease. On 5FU+ Avastin q 2 W.  # Proceed with 5-FU plus bevacizumab #4. Labs today reviewed;  acceptable for treatment today.  Would recommend a CT scan after this cycle-especially as CEA is rising.  If patient has progressive disease-the next therapy treatment  option would be Opdivo plus Rego.   #Ongoing diarrhea-2-3 loose stools/ day; continue Lomotil-refilled; continue imodium; STABLE.   # Abdominal discomfort/epigastric/ back pain -Sec to malignancy; fentany/ hydocodone TID. STABLE. Continue current pain regimen.  #Hypertension- Currently on Norvasc 5 mg blood pressure 159/80- ok to proceed with Avastin.  Stable.  # hypokalemia- 2.2; sec to diarrhea/  Kdur to 20 TID; continue supp for now. Recent mag- N.  Add IB KCL today.   # Peripheral neuropathy  Stable.   # DISPOSITION: check Mag today # KCL 20 meq today # 5FU-Avastin today; pump off in 2 days.  # in 1 week- lab-bmp; possible IV Kcl  # follow up in 2 weeks; labs- cbc/cmp/CEA; UA; 5FU-Avastin; IV KCL 20 meq; pump off 2 days later; CT scan  prior-Dr.B      Orders Placed This Encounter  Procedures  . CT ABDOMEN PELVIS W CONTRAST    Standing Status:   Future    Standing Expiration Date:   04/20/2020    Order Specific Question:   If indicated for the ordered procedure, I authorize the administration of contrast media per Radiology protocol    Answer:   Yes    Order Specific Question:   Preferred imaging location?    Answer:   [facility]    Order Specific Question:   Radiology Contrast Protocol - do NOT remove file path    Answer:   [email or web address]    Order Specific Question:   ** REASON FOR EXAM (FREE TEXT)    Answer:   colon cancer    Order Specific Question:   Is patient pregnant?  Answer:   No  . Magnesium    Standing Status:   Future    Number of Occurrences:   1    Standing Expiration Date:   04/20/2020  . Basic metabolic panel    Standing Status:   Future    Standing Expiration Date:   04/20/2020  . CBC with Differential    Standing Status:   Standing    Number of Occurrences:   20    Standing Expiration Date:   04/20/2020  . Comprehensive metabolic panel    Standing Status:   Standing    Number of Occurrences:   20    Standing  Expiration Date:   04/20/2020  . CEA    Standing Status:   Standing    Number of Occurrences:   20    Standing Expiration Date:   04/20/2020  . Urinalysis, Complete w Microscopic    Standing Status:   Standing    Number of Occurrences:   20    Standing Expiration Date:   04/20/2020   All questions were answered. The patient knows to call the clinic with any problems, questions or concerns.      Cammie Sickle, MD 04/21/2019 12:45 PM

## 2019-04-22 LAB — CEA: CEA: 36.2 ng/mL — ABNORMAL HIGH (ref 0.0–4.7)

## 2019-04-23 ENCOUNTER — Other Ambulatory Visit: Payer: Self-pay

## 2019-04-23 ENCOUNTER — Inpatient Hospital Stay: Payer: BC Managed Care – PPO | Attending: Internal Medicine

## 2019-04-23 VITALS — BP 178/73 | HR 56 | Resp 18

## 2019-04-23 DIAGNOSIS — C182 Malignant neoplasm of ascending colon: Secondary | ICD-10-CM

## 2019-04-23 DIAGNOSIS — C772 Secondary and unspecified malignant neoplasm of intra-abdominal lymph nodes: Secondary | ICD-10-CM | POA: Insufficient documentation

## 2019-04-23 DIAGNOSIS — R197 Diarrhea, unspecified: Secondary | ICD-10-CM | POA: Diagnosis not present

## 2019-04-23 DIAGNOSIS — I10 Essential (primary) hypertension: Secondary | ICD-10-CM | POA: Diagnosis not present

## 2019-04-23 DIAGNOSIS — C19 Malignant neoplasm of rectosigmoid junction: Secondary | ICD-10-CM | POA: Insufficient documentation

## 2019-04-23 DIAGNOSIS — Z79899 Other long term (current) drug therapy: Secondary | ICD-10-CM | POA: Diagnosis not present

## 2019-04-23 DIAGNOSIS — E876 Hypokalemia: Secondary | ICD-10-CM | POA: Insufficient documentation

## 2019-04-23 DIAGNOSIS — C787 Secondary malignant neoplasm of liver and intrahepatic bile duct: Secondary | ICD-10-CM | POA: Diagnosis not present

## 2019-04-23 MED ORDER — HEPARIN SOD (PORK) LOCK FLUSH 100 UNIT/ML IV SOLN
500.0000 [IU] | Freq: Once | INTRAVENOUS | Status: AC | PRN
  Administered 2019-04-23: 500 [IU]
  Filled 2019-04-23: qty 5

## 2019-04-23 MED ORDER — SODIUM CHLORIDE 0.9% FLUSH
10.0000 mL | INTRAVENOUS | Status: DC | PRN
  Administered 2019-04-23: 10 mL
  Filled 2019-04-23: qty 10

## 2019-04-28 ENCOUNTER — Other Ambulatory Visit: Payer: Self-pay

## 2019-04-28 ENCOUNTER — Inpatient Hospital Stay: Payer: BC Managed Care – PPO

## 2019-04-28 DIAGNOSIS — C182 Malignant neoplasm of ascending colon: Secondary | ICD-10-CM

## 2019-04-28 DIAGNOSIS — C772 Secondary and unspecified malignant neoplasm of intra-abdominal lymph nodes: Secondary | ICD-10-CM

## 2019-04-28 DIAGNOSIS — Z95828 Presence of other vascular implants and grafts: Secondary | ICD-10-CM

## 2019-04-28 DIAGNOSIS — C7951 Secondary malignant neoplasm of bone: Secondary | ICD-10-CM

## 2019-04-28 DIAGNOSIS — E876 Hypokalemia: Secondary | ICD-10-CM | POA: Diagnosis not present

## 2019-04-28 LAB — BASIC METABOLIC PANEL
Anion gap: 6 (ref 5–15)
BUN: 9 mg/dL (ref 6–20)
CO2: 30 mmol/L (ref 22–32)
Calcium: 8 mg/dL — ABNORMAL LOW (ref 8.9–10.3)
Chloride: 103 mmol/L (ref 98–111)
Creatinine, Ser: 0.58 mg/dL (ref 0.44–1.00)
GFR calc Af Amer: >60 mL/min (ref >60)
GFR calc non Af Amer: >60 mL/min (ref >60)
Glucose, Bld: 93 mg/dL (ref 70–99)
Potassium: 2.8 mmol/L — ABNORMAL LOW (ref 3.5–5.1)
Sodium: 139 mmol/L (ref 135–145)

## 2019-04-28 MED ORDER — SODIUM CHLORIDE 0.9 % IV SOLN
Freq: Once | INTRAVENOUS | Status: AC
  Administered 2019-04-28: 11:00:00 via INTRAVENOUS
  Filled 2019-04-28: qty 250

## 2019-04-28 MED ORDER — POTASSIUM CHLORIDE 20 MEQ/100ML IV SOLN
20.0000 meq | Freq: Once | INTRAVENOUS | Status: AC
  Administered 2019-04-28: 20 meq via INTRAVENOUS

## 2019-04-28 MED ORDER — HEPARIN SOD (PORK) LOCK FLUSH 100 UNIT/ML IV SOLN
INTRAVENOUS | Status: AC
  Filled 2019-04-28: qty 5

## 2019-04-28 MED ORDER — HEPARIN SOD (PORK) LOCK FLUSH 100 UNIT/ML IV SOLN
500.0000 [IU] | Freq: Once | INTRAVENOUS | Status: AC
  Administered 2019-04-28: 500 [IU] via INTRAVENOUS

## 2019-04-28 MED ORDER — SODIUM CHLORIDE 0.9% FLUSH
10.0000 mL | Freq: Once | INTRAVENOUS | Status: AC
  Administered 2019-04-28: 10 mL via INTRAVENOUS
  Filled 2019-04-28: qty 10

## 2019-04-30 ENCOUNTER — Inpatient Hospital Stay (HOSPITAL_BASED_OUTPATIENT_CLINIC_OR_DEPARTMENT_OTHER): Payer: BC Managed Care – PPO | Admitting: Hospice and Palliative Medicine

## 2019-04-30 DIAGNOSIS — Z515 Encounter for palliative care: Secondary | ICD-10-CM | POA: Diagnosis not present

## 2019-04-30 DIAGNOSIS — C7951 Secondary malignant neoplasm of bone: Secondary | ICD-10-CM | POA: Diagnosis not present

## 2019-04-30 NOTE — Progress Notes (Signed)
Virtual Visit via Telephone Note  I connected with Nancy Clark on 04/30/19 at 11:00 AM EST by telephone and verified that I am speaking with the correct person using two identifiers.   I discussed the limitations, risks, security and privacy concerns of performing an evaluation and management service by telephone and the availability of in person appointments. I also discussed with the patient that there may be a patient responsible charge related to this service. The patient expressed understanding and agreed to proceed.   History of Present Illness: Palliative Care consult requested for this 58 y.o. female with multiple medical problems including stage IV colorectal cancer status post right hemicolectomy and XRT.  She is also status post liver ablation to area of hepatic metastasis.  Patient is currently being treated on systemic therapy with 5-FU plus bevacizumab.  She was referred to palliative care to help discuss goals and manage ongoing symptoms.   Observations/Objective: I called and spoke with patient by phone.  She reports that she is doing well.  She denies any acute changes or concerns.  She denies any distressing symptoms.  She reports improved oral intake.  No concerns or issues with her medications.  She is awaiting mail order arrival of her last medications that were refilled.  Assessment and Plan: Stage IV colorectal cancer - on systemic therapy and followed by Dr. Rogue Bussing.  Patient is without distressing symptoms at present.  Will follow  Follow Up Instructions: Follow-up telephone visit in 3 to 4 weeks   I discussed the assessment and treatment plan with the patient. The patient was provided an opportunity to ask questions and all were answered. The patient agreed with the plan and demonstrated an understanding of the instructions.   The patient was advised to call back or seek an in-person evaluation if the symptoms worsen or if the condition fails to improve as  anticipated.  I provided 8 minutes of non-face-to-face time during this encounter.   Irean Hong, NP

## 2019-05-02 ENCOUNTER — Emergency Department
Admission: EM | Admit: 2019-05-02 | Discharge: 2019-05-03 | Disposition: A | Discharge status: Other Acute Inpatient Hospital | Payer: BC Managed Care – PPO | Attending: Emergency Medicine | Admitting: Emergency Medicine

## 2019-05-02 ENCOUNTER — Other Ambulatory Visit: Payer: Self-pay

## 2019-05-02 ENCOUNTER — Telehealth: Payer: Self-pay | Admitting: Internal Medicine

## 2019-05-02 ENCOUNTER — Ambulatory Visit
Admission: RE | Admit: 2019-05-02 | Discharge: 2019-05-02 | Disposition: A | Discharge status: Home / Self Care | Payer: BC Managed Care – PPO | Source: Ambulatory Visit | Attending: Internal Medicine | Admitting: Internal Medicine

## 2019-05-02 DIAGNOSIS — Z20828 Contact with and (suspected) exposure to other viral communicable diseases: Secondary | ICD-10-CM | POA: Insufficient documentation

## 2019-05-02 DIAGNOSIS — Z87891 Personal history of nicotine dependence: Secondary | ICD-10-CM | POA: Insufficient documentation

## 2019-05-02 DIAGNOSIS — R1011 Right upper quadrant pain: Secondary | ICD-10-CM

## 2019-05-02 DIAGNOSIS — Z79899 Other long term (current) drug therapy: Secondary | ICD-10-CM | POA: Insufficient documentation

## 2019-05-02 DIAGNOSIS — C772 Secondary and unspecified malignant neoplasm of intra-abdominal lymph nodes: Secondary | ICD-10-CM

## 2019-05-02 DIAGNOSIS — I10 Essential (primary) hypertension: Secondary | ICD-10-CM | POA: Insufficient documentation

## 2019-05-02 DIAGNOSIS — K831 Obstruction of bile duct: Secondary | ICD-10-CM | POA: Diagnosis not present

## 2019-05-02 DIAGNOSIS — C182 Malignant neoplasm of ascending colon: Secondary | ICD-10-CM | POA: Insufficient documentation

## 2019-05-02 LAB — COMPREHENSIVE METABOLIC PANEL
ALT: 95 U/L — ABNORMAL HIGH (ref 0–44)
AST: 116 U/L — ABNORMAL HIGH (ref 15–41)
Albumin: 3.1 g/dL — ABNORMAL LOW (ref 3.5–5.0)
Alkaline Phosphatase: 532 U/L — ABNORMAL HIGH (ref 38–126)
Anion gap: 10 (ref 5–15)
BUN: 7 mg/dL (ref 6–20)
CO2: 28 mmol/L (ref 22–32)
Calcium: 8.7 mg/dL — ABNORMAL LOW (ref 8.9–10.3)
Chloride: 102 mmol/L (ref 98–111)
Creatinine, Ser: 0.59 mg/dL (ref 0.44–1.00)
GFR calc Af Amer: >60 mL/min (ref >60)
GFR calc non Af Amer: >60 mL/min (ref >60)
Glucose, Bld: 94 mg/dL (ref 70–99)
Potassium: 2.4 mmol/L — CL (ref 3.5–5.1)
Sodium: 140 mmol/L (ref 135–145)
Total Bilirubin: 7.4 mg/dL — ABNORMAL HIGH (ref 0.3–1.2)
Total Protein: 6.4 g/dL — ABNORMAL LOW (ref 6.5–8.1)

## 2019-05-02 LAB — CBC
HCT: 33 % — ABNORMAL LOW (ref 36.0–46.0)
Hemoglobin: 11.1 g/dL — ABNORMAL LOW (ref 12.0–15.0)
MCH: 28.7 pg (ref 26.0–34.0)
MCHC: 33.6 g/dL (ref 30.0–36.0)
MCV: 85.3 fL (ref 80.0–100.0)
Platelets: 122 10*3/uL — ABNORMAL LOW (ref 150–400)
RBC: 3.87 MIL/uL (ref 3.87–5.11)
RDW: 20.2 % — ABNORMAL HIGH (ref 11.5–15.5)
WBC: 6.5 10*3/uL (ref 4.0–10.5)
nRBC: 0 % (ref 0.0–0.2)

## 2019-05-02 LAB — LIPASE, BLOOD: Lipase: 14 U/L (ref 11–51)

## 2019-05-02 LAB — BILIRUBIN, DIRECT: Bilirubin, Direct: 5 mg/dL — ABNORMAL HIGH (ref 0.0–0.2)

## 2019-05-02 MED ORDER — ONDANSETRON HCL 4 MG/2ML IJ SOLN
4.0000 mg | Freq: Once | INTRAMUSCULAR | Status: AC
  Administered 2019-05-02: 4 mg via INTRAVENOUS
  Filled 2019-05-02: qty 2

## 2019-05-02 MED ORDER — IOHEXOL 300 MG/ML  SOLN
75.0000 mL | Freq: Once | INTRAMUSCULAR | Status: AC | PRN
  Administered 2019-05-02: 75 mL via INTRAVENOUS

## 2019-05-02 MED ORDER — MORPHINE SULFATE (PF) 4 MG/ML IV SOLN
4.0000 mg | Freq: Once | INTRAVENOUS | Status: AC
  Administered 2019-05-02: 4 mg via INTRAVENOUS
  Filled 2019-05-02: qty 1

## 2019-05-02 NOTE — ED Notes (Signed)
Pt resting, spouse at bedside.  

## 2019-05-02 NOTE — ED Provider Notes (Addendum)
[facility] Emergency Department Provider Note   ____________________________________________   First MD Initiated Contact with Patient 05/02/19 1859     (approximate)  I have reviewed the triage vital signs and the nursing notes.   HISTORY  Chief Complaint Abdominal Pain and abnormal ct    HPI Nancy Clark is a 58 y.o. female patient with history of colon cancer and development of right upper quadrant pain several days ago.  She has no nausea or vomiting or fever.  The pain is deep and achy.  There is no jaundice.  Pain is currently moderate       Past Medical History:  Diagnosis Date  . Chicken pox   . Colon cancer ([place])    Partial colectomy 07/2015 + chemo tx's  . Hypertension   . Hypokalemia   . Menopause    > 5 yrs  . Peripheral neuropathy due to chemotherapy ([place])    bi lat feet    Patient Active Problem List   Diagnosis Date Noted  . Liver mass, right lobe 11/06/2018  . Goals of care, counseling/discussion 10/19/2018  . Cancer, metastatic to bone ([place]) 07/31/2016  . Encounter for antineoplastic chemotherapy 12/07/2015  . Malnutrition of moderate degree 10/13/2015  . Gastrointestinal bleeding 10/12/2015  . Pancytopenia due to chemotherapy ([place]) 10/12/2015  . Cancer of ascending colon metastatic to intra-abdominal lymph node ([place]) 09/06/2015  . Preventative health care 06/08/2015    Past Surgical History:  Procedure Laterality Date  . BREAST BIOPSY Right    2007? pt unsure done by [facility]  . COLONOSCOPY    . COLONOSCOPY WITH PROPOFOL N/A 10/14/2015   Procedure: COLONOSCOPY WITH PROPOFOL;  Surgeon: Hulen Luster, MD;  Location: [facility] ENDOSCOPY;  Service: Endoscopy;  Laterality: N/A;  . COLOSTOMY REVISION Right 08/09/2015   Procedure: COLON RESECTION RIGHT;  Surgeon: Florene Glen, MD;  Location: [facility] ORS;  Service: General;  Laterality: Right;  . ECTOPIC PREGNANCY SURGERY    . IR BONE TUMOR(S)RF ABLATION  05/24/2018  . IR KYPHO LUMBAR  INC FX REDUCE BONE BX UNI/BIL CANNULATION INC/IMAGING  05/24/2018  . IR RADIOLOGIST EVAL & MGMT  05/08/2018  . IR RADIOLOGIST EVAL & MGMT  06/18/2018  . IR RADIOLOGIST EVAL & MGMT  07/31/2018  . IR RADIOLOGIST EVAL & MGMT  12/19/2018  . IR RADIOLOGIST EVAL & MGMT  02/27/2019  . PORTACATH PLACEMENT N/A 09/01/2015   Procedure: INSERTION PORT-A-CATH;  Surgeon: Florene Glen, MD;  Location: [facility] ORS;  Service: General;  Laterality: N/A;  . RADIOLOGY WITH ANESTHESIA N/A 11/06/2018   Procedure: CT-MICROWAVE THERMAL ABLATION/LIVER;  Surgeon: Corrie Mckusick, DO;  Location: [facility] ORS;  Service: Anesthesiology;  Laterality: N/A;  . SHOULDER SURGERY Left     Prior to Admission medications   Medication Sig Start Date End Date Taking? Authorizing Provider  acetaminophen (TYLENOL) 325 MG tablet Take 2 tablets (650 mg total) by mouth every 6 (six) hours as needed for mild pain (or Fever >/= 101). 10/14/15   Gouru, Illene Silver, MD  amLODipine (NORVASC) 5 MG tablet Take 1 tablet (5 mg total) by mouth daily. 03/19/19   Lloyd Huger, MD  Calcium Carbonate-Vitamin D (CALCIUM 600/VITAMIN D PO) Take 1 tablet by mouth every evening.     [provider]  diphenoxylate-atropine (LOMOTIL) 2.5-0.025 MG tablet Take 1 tablet by mouth 4 (four) times daily as needed for diarrhea or loose stools. Take it along with immodium 04/21/19   Cammie Sickle, MD  dronabinol (  MARINOL) 5 MG capsule Take 1 capsule (5 mg total) by mouth 2 (two) times daily before lunch and supper. 12/19/18   Verlon Au, NP  DULoxetine (CYMBALTA) 20 MG capsule Take 2 capsules (40 mg total) by mouth daily. Patient taking differently: Take 20 mg by mouth 2 (two) times daily.  06/10/18   Cammie Sickle, MD  feeding supplement (BOOST HIGH PROTEIN) LIQD Take 1 Container by mouth daily.     [provider]  fentaNYL (DURAGESIC) 50 MCG/HR Place 1 patch onto the skin every 3 (three) days. 04/16/19   Cammie Sickle, MD  ferrous  sulfate (FERROUSUL) 325 (65 FE) MG tablet Take 1 tablet (325 mg total) by mouth daily with breakfast. Patient not taking: Reported on 04/16/2019 10/14/15   Nicholes Mango, MD  gabapentin (NEURONTIN) 300 MG capsule Take 1-2 capsules (300-600 mg total) by mouth See admin instructions. Take 1 capsule (300 mg) by mouth daily in the morning & take 2 capsules (600 mg) by mouth at night. 12/30/18   Cammie Sickle, MD  HYDROcodone-acetaminophen (NORCO/VICODIN) 5-325 MG tablet Take 1 tablet by mouth every 8 (eight) hours as needed (pain.). 04/16/19   Cammie Sickle, MD  lidocaine-prilocaine (EMLA) cream Apply 1 application topically as needed. Apply generously over the Mediport 45 minutes prior to chemotherapy. Patient taking differently: Apply 1 application topically as needed (prior to chemotherapy.).  05/27/18   Cammie Sickle, MD  loperamide (IMODIUM) 2 MG capsule Take 2 mg by mouth 4 (four) times daily as needed for diarrhea or loose stools.     [provider]  Multiple Vitamin (MULTIVITAMIN WITH MINERALS) TABS tablet Take 1 tablet by mouth daily.    [provider]  ondansetron (ZOFRAN) 8 MG tablet One pill every 8 hours as needed for nausea/vomitting. Patient taking differently: Take 8 mg by mouth every 8 (eight) hours as needed for nausea or vomiting.  09/09/18   Cammie Sickle, MD  potassium chloride SA (K-DUR) 20 MEQ tablet Take 1 tablet (20 mEq total) by mouth 2 (two) times daily. 12/30/18   Cammie Sickle, MD  prochlorperazine (COMPAZINE) 10 MG tablet Take 1 tablet (10 mg total) by mouth every 6 (six) hours as needed for nausea or vomiting. 05/27/18   Cammie Sickle, MD    Allergies Patient has no known allergies.  Family History  Problem Relation Age of Onset  . Hypertension Mother   . Arthritis Father   . Prostate cancer Father   . Diabetes Maternal Grandmother   . Colon cancer Maternal Grandfather        colon    Social  History Social History   Tobacco Use  . Smoking status: Former Smoker    Packs/day: 0.25    Years: 2.00    Pack years: 0.50  . Smokeless tobacco: Never Used  . Tobacco comment: recently quit  Substance Use Topics  . Alcohol use: No    Alcohol/week: 0.0 standard drinks  . Drug use: No    Review of Systems  Constitutional: No fever/chills Eyes: No visual changes. ENT: No sore throat. Cardiovascular: Denies chest pain. Respiratory: Denies shortness of breath. Gastrointestinal: Right upper quadrant abdominal pain.  No nausea, no vomiting.  No diarrhea.  No constipation. Genitourinary: Negative for dysuria. Musculoskeletal: Negative for back pain. Skin: Negative for rash. Neurological: Negative for headaches, focal weakness   ____________________________________________   PHYSICAL EXAM:  VITAL SIGNS: ED Triage Vitals  Enc Vitals Group  BP 05/02/19 1334 (!) 194/90     Pulse Rate 05/02/19 1334 77     Resp 05/02/19 1334 16     Temp 05/02/19 1334 99.3 F (37.4 C)     Temp Source 05/02/19 1334 Oral     SpO2 05/02/19 1334 100 %     Weight 05/02/19 1345 89 lb (40.4 kg)     Height 05/02/19 1345 5' (1.524 m)     Head Circumference --      Peak Flow --      Pain Score 05/02/19 1345 6     Pain Loc --      Pain Edu? --      Excl. in [place]? --     Constitutional: Alert and oriented. Well appearing and in no acute distress! Eyes: Conjunctivae are normal.  No jaundice Head: Atraumatic. Nose: No congestion/rhinnorhea. Mouth/Throat: Mucous membranes are moist.  Oropharynx non-erythematous. Neck: No stridor.  Cardiovascular: Normal rate, regular rhythm. Grossly normal heart sounds.  Good peripheral circulation. Respiratory: Normal respiratory effort.  No retractions. Lungs CTAB. Gastrointestinal: Soft and nontender except to palpation in the right upper quadrant. No distention. No abdominal bruits. No CVA tenderness. Musculoskeletal: No lower extremity tenderness nor edema.    Neurologic:  Normal speech and language. No gross focal neurologic deficits are appreciated.  Skin:  Skin is warm, dry and intact. No rash noted.   ____________________________________________   LABS (all labs ordered are listed, but only abnormal results are displayed)  Labs Reviewed  COMPREHENSIVE METABOLIC PANEL - Abnormal; Notable for the following components:      Result Value   Potassium 2.4 (*)    Calcium 8.7 (*)    Total Protein 6.4 (*)    Albumin 3.1 (*)    AST 116 (*)    ALT 95 (*)    Alkaline Phosphatase 532 (*)    Total Bilirubin 7.4 (*)    All other components within normal limits  CBC - Abnormal; Notable for the following components:   Hemoglobin 11.1 (*)    HCT 33.0 (*)    RDW 20.2 (*)    Platelets 122 (*)    All other components within normal limits  BILIRUBIN, DIRECT - Abnormal; Notable for the following components:   Bilirubin, Direct 5.0 (*)    All other components within normal limits  SARS CORONAVIRUS 2 (TAT 6-24 HRS)  LIPASE, BLOOD  URINALYSIS, COMPLETE (UACMP) WITH MICROSCOPIC   ____________________________________________  EKG   ____________________________________________  RADIOLOGY  ED MD interpretation CT read by radiology reviewed by me shows obstruction of biliary duct per radiology  Official radiology report(s): CT ABDOMEN PELVIS W CONTRAST  Result Date: 05/02/2019 CLINICAL DATA:  Colorectal carcinoma with metastasis. Patient status post RIGHT hemicolectomy and liver ablation. EXAM: CT ABDOMEN AND PELVIS WITH CONTRAST TECHNIQUE: Multidetector CT imaging of the abdomen and pelvis was performed using the standard protocol following bolus administration of intravenous contrast. CONTRAST:  71mL OMNIPAQUE IOHEXOL 300 MG/ML  SOLN COMPARISON:  CT 02/24/2019 FINDINGS: Lower chest: Lung bases are clear. Hepatobiliary: New intrahepatic and extrahepatic biliary duct dilatation. Ductal dilatation extends into the LEFT and RIGHT hepatic lobes and is  moderate in severity. The duct dilatation appears to occur to the level of the proximal common bile duct. The exact location of the obstructed is difficult to define but appears to originate just before pancreatic head (image 19/2). 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 Additionally there  is new dilatation of the gallbladder to 5 cm increased from 2 cm on prior. Moderate volume pericholecystic fluid. Within liver parenchyma there is a new low-density lesion in the caudate lobe measuring 13 mm (image 4/2.) potential new lesion adjacent to to fluid RIGHT hepatic lobe lesion measuring 14 mm (image 13/2). The third additional new lesion along the inferior aspect of the central LEFT hepatic lobe (segment 4B) measuring 13 mm on image 19/2. Pancreas: Pancreas appears grossly normal. No pancreatic duct dilatation. Spleen: Normal spleen Adrenals/urinary tract: Adrenal glands and kidneys are normal. The ureters and bladder normal. Stomach/Bowel: Stomach, duodenum and small bowel grossly normal. Post partial RIGHT hemicolectomy. No evidence of bowel obstruction. There is mild submucosal edema and mucosal enhancement through the sigmoid colon rectum which is new from prior. Vascular/Lymphatic: Abdominal aorta is normal caliber. Constriction of the RIGHT portal vein at the takeoff from the main portal vein as described the hepatic biliary findings section. No periportal or retroperitoneal adenopathy. No pelvic adenopathy. Reproductive: Uterus and necks are normal. Other: No free fluid. Musculoskeletal: No aggressive osseous lesion. IMPRESSION: 1. New biliary obstruction at the level of the mid to distal common bile duct. 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 2. Biliary obstruction involves LEFT and RIGHT hepatic lobe as well as new distension of the gallbladder.  Recommend correlation with bilirubin levels as patient may require palliative relief of obstruction. 3. Three new low-density lesions in liver concerning for hepatic metastasis. 4. Submucosal edema and mild mucosal enhancement in the LEFT colon and rectosigmoid colon is nonspecific but could represent colitis. [facility] on 05/02/2019  at12:26. Electronically Signed   By: Suzy Bouchard M.D.   On: 05/02/2019 12:26    ____________________________________________   PROCEDURES  Procedure(s) performed (including Critical Care): Critical care time 45 minutes this includes multiple calls to various consultants trying to get her accepted.  Procedures   ____________________________________________   INITIAL IMPRESSION / ASSESSMENT AND PLAN / ED COURSE  I am paging GI.  This lady looks like she may need a stent or a T-tube.  Additionally we will get her some potassium    Nancy Clark was evaluated in Emergency Department on 05/02/2019 for the symptoms described in the history of present illness. She was evaluated in the context of the global COVID-19 pandemic, which necessitated consideration that the patient might be at risk for infection with the SARS-CoV-2 virus that causes COVID-19. Institutional protocols and algorithms that pertain to the evaluation of patients at risk for COVID-19 are in a state of rapid change based on information released by regulatory bodies including the CDC and federal and state organizations. These policies and algorithms were followed during the patient's care in the ED.          ____________________________________________   FINAL CLINICAL IMPRESSION(S) / ED DIAGNOSES  Final diagnoses:  Right upper quadrant abdominal pain  Biliary obstruction     ED Discharge Orders    None       Note:  This document was prepared using Dragon voice recognition software and may include unintentional dictation errors.    Nena Polio, MD 05/02/19 1911    Nena Polio, MD 05/02/19 2023

## 2019-05-02 NOTE — ED Triage Notes (Signed)
Pt to ED via POV for chief complaint of abnormal CT of her liver, states she was told her liver was enlarged and was told to come to the ED by her primary care physician. Pt reports abdominal pain located in the RLQ that started a few days ago. Denies N/V, or fevers.  Pt in NAD at this time.

## 2019-05-02 NOTE — Telephone Encounter (Signed)
I spoke to Dr.Stewart re: significantly dilated gall bladder- likely extrinsic compression from progressive tumor.  11/30-LFTs alkaline phosphatase slightly elevated.   Patient needs urgent biliary decompression; reevaluation of LFTs.    Tried to reach IR- unable.   Recommend going to the emergency room-surgical evaluation/IR evaluation.   I spoke to patient of my above recommendations. In agreement.

## 2019-05-02 NOTE — ED Notes (Signed)
Lab called regarding potassium of 2.4

## 2019-05-03 ENCOUNTER — Inpatient Hospital Stay (HOSPITAL_COMMUNITY)
Admission: AD | Admit: 2019-05-03 | Discharge: 2019-05-06 | DRG: 445 | Disposition: A | Discharge status: Home / Self Care | Payer: BC Managed Care – PPO | Source: Other Acute Inpatient Hospital | Attending: Internal Medicine | Admitting: Internal Medicine

## 2019-05-03 ENCOUNTER — Encounter (HOSPITAL_COMMUNITY): Payer: Self-pay | Admitting: Internal Medicine

## 2019-05-03 DIAGNOSIS — E876 Hypokalemia: Secondary | ICD-10-CM | POA: Diagnosis present

## 2019-05-03 DIAGNOSIS — R64 Cachexia: Secondary | ICD-10-CM | POA: Diagnosis present

## 2019-05-03 DIAGNOSIS — C787 Secondary malignant neoplasm of liver and intrahepatic bile duct: Secondary | ICD-10-CM | POA: Diagnosis present

## 2019-05-03 DIAGNOSIS — I160 Hypertensive urgency: Secondary | ICD-10-CM | POA: Diagnosis present

## 2019-05-03 DIAGNOSIS — Z923 Personal history of irradiation: Secondary | ICD-10-CM

## 2019-05-03 DIAGNOSIS — Z9049 Acquired absence of other specified parts of digestive tract: Secondary | ICD-10-CM

## 2019-05-03 DIAGNOSIS — K8309 Other cholangitis: Secondary | ICD-10-CM | POA: Diagnosis present

## 2019-05-03 DIAGNOSIS — Z85038 Personal history of other malignant neoplasm of large intestine: Secondary | ICD-10-CM | POA: Diagnosis not present

## 2019-05-03 DIAGNOSIS — K831 Obstruction of bile duct: Principal | ICD-10-CM | POA: Diagnosis present

## 2019-05-03 DIAGNOSIS — Z9221 Personal history of antineoplastic chemotherapy: Secondary | ICD-10-CM

## 2019-05-03 DIAGNOSIS — R748 Abnormal levels of other serum enzymes: Secondary | ICD-10-CM | POA: Diagnosis present

## 2019-05-03 DIAGNOSIS — R519 Headache, unspecified: Secondary | ICD-10-CM | POA: Diagnosis not present

## 2019-05-03 DIAGNOSIS — C801 Malignant (primary) neoplasm, unspecified: Secondary | ICD-10-CM

## 2019-05-03 DIAGNOSIS — T451X5A Adverse effect of antineoplastic and immunosuppressive drugs, initial encounter: Secondary | ICD-10-CM | POA: Diagnosis present

## 2019-05-03 DIAGNOSIS — Z87891 Personal history of nicotine dependence: Secondary | ICD-10-CM | POA: Diagnosis not present

## 2019-05-03 DIAGNOSIS — Z79899 Other long term (current) drug therapy: Secondary | ICD-10-CM

## 2019-05-03 DIAGNOSIS — Z8249 Family history of ischemic heart disease and other diseases of the circulatory system: Secondary | ICD-10-CM | POA: Diagnosis not present

## 2019-05-03 DIAGNOSIS — G620 Drug-induced polyneuropathy: Secondary | ICD-10-CM | POA: Diagnosis present

## 2019-05-03 DIAGNOSIS — Z800 Family history of malignant neoplasm of digestive organs: Secondary | ICD-10-CM | POA: Diagnosis not present

## 2019-05-03 DIAGNOSIS — I10 Essential (primary) hypertension: Secondary | ICD-10-CM | POA: Diagnosis present

## 2019-05-03 DIAGNOSIS — D61818 Other pancytopenia: Secondary | ICD-10-CM | POA: Diagnosis present

## 2019-05-03 DIAGNOSIS — D696 Thrombocytopenia, unspecified: Secondary | ICD-10-CM | POA: Diagnosis present

## 2019-05-03 DIAGNOSIS — Z20828 Contact with and (suspected) exposure to other viral communicable diseases: Secondary | ICD-10-CM | POA: Diagnosis present

## 2019-05-03 DIAGNOSIS — Z681 Body mass index (BMI) 19 or less, adult: Secondary | ICD-10-CM

## 2019-05-03 DIAGNOSIS — D649 Anemia, unspecified: Secondary | ICD-10-CM | POA: Diagnosis not present

## 2019-05-03 DIAGNOSIS — R109 Unspecified abdominal pain: Secondary | ICD-10-CM | POA: Diagnosis present

## 2019-05-03 LAB — COMPREHENSIVE METABOLIC PANEL
ALT: 77 U/L — ABNORMAL HIGH (ref 0–44)
AST: 83 U/L — ABNORMAL HIGH (ref 15–41)
Albumin: 2.5 g/dL — ABNORMAL LOW (ref 3.5–5.0)
Alkaline Phosphatase: 455 U/L — ABNORMAL HIGH (ref 38–126)
Anion gap: 14 (ref 5–15)
BUN: 11 mg/dL (ref 6–20)
CO2: 27 mmol/L (ref 22–32)
Calcium: 8.4 mg/dL — ABNORMAL LOW (ref 8.9–10.3)
Chloride: 104 mmol/L (ref 98–111)
Creatinine, Ser: 0.94 mg/dL (ref 0.44–1.00)
GFR calc Af Amer: >60 mL/min (ref >60)
GFR calc non Af Amer: >60 mL/min (ref >60)
Glucose, Bld: 64 mg/dL — ABNORMAL LOW (ref 70–99)
Potassium: 2.6 mmol/L — CL (ref 3.5–5.1)
Sodium: 145 mmol/L (ref 135–145)
Total Bilirubin: 8.5 mg/dL — ABNORMAL HIGH (ref 0.3–1.2)
Total Protein: 5.3 g/dL — ABNORMAL LOW (ref 6.5–8.1)

## 2019-05-03 LAB — CBC WITH DIFFERENTIAL/PLATELET
Abs Immature Granulocytes: 0.03 10*3/uL (ref 0.00–0.07)
Basophils Absolute: 0 10*3/uL (ref 0.0–0.1)
Basophils Relative: 0 %
Eosinophils Absolute: 0 10*3/uL (ref 0.0–0.5)
Eosinophils Relative: 0 %
HCT: 18.3 % — ABNORMAL LOW (ref 36.0–46.0)
Hemoglobin: 5.6 g/dL — CL (ref 12.0–15.0)
Immature Granulocytes: 1 %
Lymphocytes Relative: 8 %
Lymphs Abs: 0.4 10*3/uL — ABNORMAL LOW (ref 0.7–4.0)
MCH: 29 pg (ref 26.0–34.0)
MCHC: 30.6 g/dL (ref 30.0–36.0)
MCV: 94.8 fL (ref 80.0–100.0)
Monocytes Absolute: 0.4 10*3/uL (ref 0.1–1.0)
Monocytes Relative: 9 %
Neutro Abs: 3.9 10*3/uL (ref 1.7–7.7)
Neutrophils Relative %: 82 %
Platelets: 75 10*3/uL — ABNORMAL LOW (ref 150–400)
RBC: 1.93 MIL/uL — ABNORMAL LOW (ref 3.87–5.11)
RDW: 20.6 % — ABNORMAL HIGH (ref 11.5–15.5)
WBC: 4.8 10*3/uL (ref 4.0–10.5)
nRBC: 0 % (ref 0.0–0.2)

## 2019-05-03 LAB — SARS CORONAVIRUS 2 (TAT 6-24 HRS): SARS Coronavirus 2: NEGATIVE

## 2019-05-03 LAB — HEMOGLOBIN AND HEMATOCRIT, BLOOD
HCT: 29.9 % — ABNORMAL LOW (ref 36.0–46.0)
HCT: 30.9 % — ABNORMAL LOW (ref 36.0–46.0)
Hemoglobin: 9.7 g/dL — ABNORMAL LOW (ref 12.0–15.0)
Hemoglobin: 10 g/dL — ABNORMAL LOW (ref 12.0–15.0)

## 2019-05-03 LAB — TYPE AND SCREEN
ABO/RH(D): O POS
Antibody Screen: NEGATIVE

## 2019-05-03 LAB — MAGNESIUM: Magnesium: 1.5 mg/dL — ABNORMAL LOW (ref 1.7–2.4)

## 2019-05-03 LAB — HIV ANTIBODY (ROUTINE TESTING W REFLEX): HIV Screen 4th Generation wRfx: NONREACTIVE

## 2019-05-03 LAB — ABO/RH: ABO/RH(D): O POS

## 2019-05-03 LAB — BILIRUBIN, DIRECT: Bilirubin, Direct: 5.4 mg/dL — ABNORMAL HIGH (ref 0.0–0.2)

## 2019-05-03 MED ORDER — POTASSIUM CHLORIDE 10 MEQ/100ML IV SOLN
INTRAVENOUS | Status: AC
  Administered 2019-05-03: 10 meq
  Filled 2019-05-03: qty 100

## 2019-05-03 MED ORDER — GABAPENTIN 300 MG PO CAPS
300.0000 mg | ORAL_CAPSULE | Freq: Every day | ORAL | Status: DC
  Administered 2019-05-04 – 2019-05-06 (×3): 300 mg via ORAL
  Filled 2019-05-03 (×3): qty 1

## 2019-05-03 MED ORDER — SODIUM CHLORIDE 0.9% IV SOLUTION: Freq: Once | INTRAVENOUS | Status: DC

## 2019-05-03 MED ORDER — DIPHENOXYLATE-ATROPINE 2.5-0.025 MG PO TABS: 1.0000 | ORAL_TABLET | Freq: Four times a day (QID) | ORAL | Status: DC | PRN

## 2019-05-03 MED ORDER — ALBUTEROL SULFATE (2.5 MG/3ML) 0.083% IN NEBU: 2.5000 mg | INHALATION_SOLUTION | Freq: Four times a day (QID) | RESPIRATORY_TRACT | Status: DC | PRN

## 2019-05-03 MED ORDER — POTASSIUM CHLORIDE CRYS ER 20 MEQ PO TBCR
40.0000 meq | EXTENDED_RELEASE_TABLET | ORAL | Status: AC
  Administered 2019-05-03: 40 meq via ORAL
  Filled 2019-05-03: qty 2

## 2019-05-03 MED ORDER — POTASSIUM CHLORIDE 10 MEQ/100ML IV SOLN
10.0000 meq | Freq: Once | INTRAVENOUS | Status: AC
  Administered 2019-05-03: 10 meq via INTRAVENOUS
  Filled 2019-05-03: qty 100

## 2019-05-03 MED ORDER — MORPHINE SULFATE (PF) 2 MG/ML IV SOLN
2.0000 mg | INTRAVENOUS | Status: DC | PRN
  Administered 2019-05-03: 2 mg via INTRAVENOUS
  Filled 2019-05-03: qty 1

## 2019-05-03 MED ORDER — POTASSIUM CHLORIDE 10 MEQ/100ML IV SOLN
10.0000 meq | INTRAVENOUS | Status: AC
  Administered 2019-05-03 (×4): 10 meq via INTRAVENOUS
  Filled 2019-05-03 (×2): qty 100

## 2019-05-03 MED ORDER — ONDANSETRON HCL 4 MG/2ML IJ SOLN
4.0000 mg | Freq: Once | INTRAMUSCULAR | Status: AC
  Administered 2019-05-03: 4 mg via INTRAVENOUS
  Filled 2019-05-03: qty 2

## 2019-05-03 MED ORDER — DRONABINOL 2.5 MG PO CAPS
5.0000 mg | ORAL_CAPSULE | Freq: Two times a day (BID) | ORAL | Status: DC
  Administered 2019-05-04 – 2019-05-06 (×4): 5 mg via ORAL
  Filled 2019-05-03 (×4): qty 2

## 2019-05-03 MED ORDER — ENSURE ENLIVE PO LIQD
237.0000 mL | Freq: Every day | ORAL | Status: DC
  Administered 2019-05-03 – 2019-05-06 (×3): 237 mL via ORAL

## 2019-05-03 MED ORDER — AMLODIPINE BESYLATE 5 MG PO TABS
5.0000 mg | ORAL_TABLET | Freq: Every day | ORAL | Status: DC
  Administered 2019-05-03 – 2019-05-06 (×4): 5 mg via ORAL
  Filled 2019-05-03 (×4): qty 1

## 2019-05-03 MED ORDER — SODIUM CHLORIDE 0.9% FLUSH: 10.0000 mL | INTRAVENOUS | Status: DC | PRN

## 2019-05-03 MED ORDER — FENTANYL 50 MCG/HR TD PT72
1.0000 | MEDICATED_PATCH | TRANSDERMAL | Status: DC
  Administered 2019-05-05: 1 via TRANSDERMAL
  Filled 2019-05-03 (×2): qty 1

## 2019-05-03 MED ORDER — SODIUM CHLORIDE 0.9 % IV SOLN
INTRAVENOUS | Status: DC
  Administered 2019-05-03 – 2019-05-05 (×2): via INTRAVENOUS

## 2019-05-03 MED ORDER — GABAPENTIN 300 MG PO CAPS
600.0000 mg | ORAL_CAPSULE | Freq: Every day | ORAL | Status: DC
  Administered 2019-05-03 – 2019-05-06 (×4): 600 mg via ORAL
  Filled 2019-05-03 (×2): qty 2
  Filled 2019-05-03: qty 6
  Filled 2019-05-03: qty 2

## 2019-05-03 MED ORDER — MAGNESIUM SULFATE 2 GM/50ML IV SOLN
2.0000 g | Freq: Once | INTRAVENOUS | Status: AC
  Administered 2019-05-03: 2 g via INTRAVENOUS
  Filled 2019-05-03: qty 50

## 2019-05-03 MED ORDER — CHLORHEXIDINE GLUCONATE CLOTH 2 % EX PADS
6.0000 | MEDICATED_PAD | Freq: Every day | CUTANEOUS | Status: DC
  Administered 2019-05-03 – 2019-05-06 (×4): 6 via TOPICAL

## 2019-05-03 MED ORDER — SODIUM CHLORIDE 0.9% FLUSH
10.0000 mL | Freq: Two times a day (BID) | INTRAVENOUS | Status: DC
  Administered 2019-05-03 – 2019-05-06 (×3): 10 mL

## 2019-05-03 MED ORDER — SODIUM CHLORIDE 0.9 % IV SOLN
Freq: Once | INTRAVENOUS | Status: AC
  Administered 2019-05-03: 02:00:00 via INTRAVENOUS

## 2019-05-03 MED ORDER — ENOXAPARIN SODIUM 30 MG/0.3ML ~~LOC~~ SOLN
30.0000 mg | SUBCUTANEOUS | Status: DC
  Administered 2019-05-03 – 2019-05-06 (×4): 30 mg via SUBCUTANEOUS
  Filled 2019-05-03 (×4): qty 0.3

## 2019-05-03 MED ORDER — PROCHLORPERAZINE MALEATE 10 MG PO TABS
10.0000 mg | ORAL_TABLET | Freq: Four times a day (QID) | ORAL | Status: DC | PRN
  Administered 2019-05-06: 10 mg via ORAL
  Filled 2019-05-03 (×3): qty 1

## 2019-05-03 MED ORDER — PIPERACILLIN-TAZOBACTAM 3.375 G IVPB 30 MIN: 3.3750 g | Freq: Three times a day (TID) | INTRAVENOUS | Status: DC

## 2019-05-03 MED ORDER — SODIUM CHLORIDE 0.9% FLUSH
3.0000 mL | Freq: Two times a day (BID) | INTRAVENOUS | Status: DC
  Administered 2019-05-03: 3 mL via INTRAVENOUS

## 2019-05-03 MED ORDER — POTASSIUM CHLORIDE CRYS ER 20 MEQ PO TBCR
20.0000 meq | EXTENDED_RELEASE_TABLET | Freq: Four times a day (QID) | ORAL | Status: DC
  Administered 2019-05-03 – 2019-05-06 (×11): 20 meq via ORAL
  Filled 2019-05-03: qty 1
  Filled 2019-05-03 (×2): qty 2
  Filled 2019-05-03 (×4): qty 1
  Filled 2019-05-03: qty 2
  Filled 2019-05-03: qty 1
  Filled 2019-05-03 (×2): qty 2

## 2019-05-03 MED ORDER — LOPERAMIDE HCL 2 MG PO CAPS: 2.0000 mg | ORAL_CAPSULE | Freq: Four times a day (QID) | ORAL | Status: DC | PRN

## 2019-05-03 MED ORDER — PIPERACILLIN-TAZOBACTAM 3.375 G IVPB
3.3750 g | Freq: Three times a day (TID) | INTRAVENOUS | Status: DC
  Administered 2019-05-03 – 2019-05-06 (×11): 3.375 g via INTRAVENOUS
  Filled 2019-05-03 (×12): qty 50

## 2019-05-03 MED ORDER — MORPHINE SULFATE (PF) 4 MG/ML IV SOLN
4.0000 mg | Freq: Once | INTRAVENOUS | Status: AC
  Administered 2019-05-03: 4 mg via INTRAVENOUS
  Filled 2019-05-03: qty 1

## 2019-05-03 MED ORDER — LIDOCAINE-PRILOCAINE 2.5-2.5 % EX CREA
1.0000 "application " | TOPICAL_CREAM | CUTANEOUS | Status: DC | PRN
  Filled 2019-05-03: qty 5

## 2019-05-03 MED ORDER — BOOST HIGH PROTEIN PO LIQD
1.0000 | Freq: Every day | ORAL | Status: DC
  Filled 2019-05-03: qty 237

## 2019-05-03 NOTE — ED Notes (Addendum)
Pt reports normally taking BP medication in the mornings, Norvasc 5mg  listed in chart

## 2019-05-03 NOTE — Progress Notes (Signed)
Patient arrived to [id number] from [facility]. A&O x 4. No complaints of pain. SR/SB on telemetry. No skin breakdown.

## 2019-05-03 NOTE — Plan of Care (Signed)
  Problem: Education: Goal: Knowledge of General Education information will improve Description: Including pain rating scale, medication(s)/side effects and non-pharmacologic comfort measures Outcome: Progressing   Problem: Clinical Measurements: Goal: Ability to maintain clinical measurements within normal limits will improve Outcome: Progressing   

## 2019-05-03 NOTE — Consult Note (Addendum)
Referring Provider: Dr. Tamala Julian Primary Care Physician:  Patient, No Pcp Per Primary Gastroenterologist:  Althia Forts  Reason for Consultation:  Jaundice; RUQ pain  HPI: Nancy Clark is a 58 y.o. female with Stage 4 colon cancer (diagnosed in 2017) with mets to the liver with previous right hemicolectomy, radiation therapy, and liver ablation on chemotherapy who was sent to [facility] ER yesterday following an abnormal CT scan that showed extrinsic biliary obstruction at the mid to distal common bile duct as well as bilateral hepatic lobes and significant distention of gallbladder. Three new liver mets seen. She has been having RUQ pain for a while and her husband noticed her yellow eyes yesterday. Denies N/V/melena/hematochezia/hematemesis. She has been having diarrhea. T 99.3 at [facility] ER. TB 7.4 (DB 5), ALP 532, AST 116, ALT 95, Lipase 14. Husband at bedside and mother on phone during my evaluation. Covid negative.    Past Medical History:  Diagnosis Date  . Chicken pox   . Colon cancer ([place])    Partial colectomy 07/2015 + chemo tx's  . Hypertension   . Hypokalemia   . Menopause    > 5 yrs  . Peripheral neuropathy due to chemotherapy ([place])    bi lat feet    Past Surgical History:  Procedure Laterality Date  . BREAST BIOPSY Right    2007? pt unsure done by [facility]  . COLONOSCOPY    . COLONOSCOPY WITH PROPOFOL N/A 10/14/2015   Procedure: COLONOSCOPY WITH PROPOFOL;  Surgeon: Hulen Luster, MD;  Location: [facility] ENDOSCOPY;  Service: Endoscopy;  Laterality: N/A;  . COLOSTOMY REVISION Right 08/09/2015   Procedure: COLON RESECTION RIGHT;  Surgeon: Florene Glen, MD;  Location: [facility] ORS;  Service: General;  Laterality: Right;  . ECTOPIC PREGNANCY SURGERY    . IR BONE TUMOR(S)RF ABLATION  05/24/2018  . IR KYPHO LUMBAR INC FX REDUCE BONE BX UNI/BIL CANNULATION INC/IMAGING  05/24/2018  . IR RADIOLOGIST EVAL & MGMT  05/08/2018  . IR RADIOLOGIST EVAL & MGMT  06/18/2018  . IR RADIOLOGIST EVAL & MGMT  07/31/2018   . IR RADIOLOGIST EVAL & MGMT  12/19/2018  . IR RADIOLOGIST EVAL & MGMT  02/27/2019  . PORTACATH PLACEMENT N/A 09/01/2015   Procedure: INSERTION PORT-A-CATH;  Surgeon: Florene Glen, MD;  Location: [facility] ORS;  Service: General;  Laterality: N/A;  . RADIOLOGY WITH ANESTHESIA N/A 11/06/2018   Procedure: CT-MICROWAVE THERMAL ABLATION/LIVER;  Surgeon: Corrie Mckusick, DO;  Location: [facility] ORS;  Service: Anesthesiology;  Laterality: N/A;  . SHOULDER SURGERY Left     Prior to Admission medications   Medication Sig Start Date End Date Taking? Authorizing Provider  acetaminophen (TYLENOL) 325 MG tablet Take 2 tablets (650 mg total) by mouth every 6 (six) hours as needed for mild pain (or Fever >/= 101). 10/14/15   Gouru, Illene Silver, MD  amLODipine (NORVASC) 5 MG tablet Take 1 tablet (5 mg total) by mouth daily. 03/19/19   Lloyd Huger, MD  Calcium Carbonate-Vitamin D (CALCIUM 600/VITAMIN D PO) Take 1 tablet by mouth every evening.     [provider]  diphenoxylate-atropine (LOMOTIL) 2.5-0.025 MG tablet Take 1 tablet by mouth 4 (four) times daily as needed for diarrhea or loose stools. Take it along with immodium 04/21/19   Cammie Sickle, MD  dronabinol (MARINOL) 5 MG capsule Take 1 capsule (5 mg total) by mouth 2 (two) times daily before lunch and supper. 12/19/18   Verlon Au, NP  DULoxetine (CYMBALTA) 20 MG capsule Take 2  capsules (40 mg total) by mouth daily. Patient taking differently: Take 20 mg by mouth 2 (two) times daily.  06/10/18   Cammie Sickle, MD  feeding supplement (BOOST HIGH PROTEIN) LIQD Take 1 Container by mouth daily.     [provider]  fentaNYL (DURAGESIC) 50 MCG/HR Place 1 patch onto the skin every 3 (three) days. 04/16/19   Cammie Sickle, MD  ferrous sulfate (FERROUSUL) 325 (65 FE) MG tablet Take 1 tablet (325 mg total) by mouth daily with breakfast. Patient not taking: Reported on 04/16/2019 10/14/15   Nicholes Mango, MD  gabapentin  (NEURONTIN) 300 MG capsule Take 1-2 capsules (300-600 mg total) by mouth See admin instructions. Take 1 capsule (300 mg) by mouth daily in the morning & take 2 capsules (600 mg) by mouth at night. 12/30/18   Cammie Sickle, MD  HYDROcodone-acetaminophen (NORCO/VICODIN) 5-325 MG tablet Take 1 tablet by mouth every 8 (eight) hours as needed (pain.). 04/16/19   Cammie Sickle, MD  lidocaine-prilocaine (EMLA) cream Apply 1 application topically as needed. Apply generously over the Mediport 45 minutes prior to chemotherapy. Patient taking differently: Apply 1 application topically as needed (prior to chemotherapy.).  05/27/18   Cammie Sickle, MD  loperamide (IMODIUM) 2 MG capsule Take 2 mg by mouth 4 (four) times daily as needed for diarrhea or loose stools.     [provider]  Multiple Vitamin (MULTIVITAMIN WITH MINERALS) TABS tablet Take 1 tablet by mouth daily.    [provider]  ondansetron (ZOFRAN) 8 MG tablet One pill every 8 hours as needed for nausea/vomitting. Patient taking differently: Take 8 mg by mouth every 8 (eight) hours as needed for nausea or vomiting.  09/09/18   Cammie Sickle, MD  potassium chloride SA (K-DUR) 20 MEQ tablet Take 1 tablet (20 mEq total) by mouth 2 (two) times daily. 12/30/18   Cammie Sickle, MD  prochlorperazine (COMPAZINE) 10 MG tablet Take 1 tablet (10 mg total) by mouth every 6 (six) hours as needed for nausea or vomiting. 05/27/18   Cammie Sickle, MD    Scheduled Meds: . sodium chloride   Intravenous Once  . enoxaparin (LOVENOX) injection  30 mg Subcutaneous Q24H  . potassium chloride  40 mEq Oral STAT  . sodium chloride flush  3 mL Intravenous Q12H   Continuous Infusions: . sodium chloride 75 mL/hr at 05/03/19 1015  . magnesium sulfate bolus IVPB    . piperacillin-tazobactam    . potassium chloride     PRN Meds:.albuterol, morphine injection  Allergies as of 05/02/2019  . (No Known Allergies)     Family History  Problem Relation Age of Onset  . Hypertension Mother   . Arthritis Father   . Prostate cancer Father   . Diabetes Maternal Grandmother   . Colon cancer Maternal Grandfather        colon    Social History   Socioeconomic History  . Marital status: Married    Spouse name: Not on file  . Number of children: Not on file  . Years of education: Not on file  . Highest education level: Not on file  Occupational History  . Not on file  Tobacco Use  . Smoking status: Former Smoker    Packs/day: 0.25    Years: 2.00    Pack years: 0.50  . Smokeless tobacco: Never Used  . Tobacco comment: recently quit  Substance and Sexual Activity  . Alcohol use: No  Alcohol/week: 0.0 standard drinks  . Drug use: No  . Sexual activity: Yes  Other Topics Concern  . Not on file  Social History Narrative  . Not on file   Social Determinants of Health   Financial Resource Strain:   . Difficulty of Paying Living Expenses: Not on file  Food Insecurity:   . Worried About Charity fundraiser in the Last Year: Not on file  . Ran Out of Food in the Last Year: Not on file  Transportation Needs:   . Lack of Transportation (Medical): Not on file  . Lack of Transportation (Non-Medical): Not on file  Physical Activity:   . Days of Exercise per Week: Not on file  . Minutes of Exercise per Session: Not on file  Stress:   . Feeling of Stress : Not on file  Social Connections:   . Frequency of Communication with Friends and Family: Not on file  . Frequency of Social Gatherings with Friends and Family: Not on file  . Attends Religious Services: Not on file  . Active Member of Clubs or Organizations: Not on file  . Attends Archivist Meetings: Not on file  . Marital Status: Not on file  Intimate Partner Violence:   . Fear of Current or Ex-Partner: Not on file  . Emotionally Abused: Not on file  . Physically Abused: Not on file  . Sexually Abused: Not on file     Review of Systems: All negative except as stated above in HPI.  Physical Exam: Vital signs: Vitals:   05/03/19 0724  BP: (!) 157/71  Pulse: 62  Temp: 98.2 F (36.8 C)  SpO2: 98%   Last BM Date: 05/03/19 General:   Cachetic, alert, pleasant, no acute distress  Head: normocephalic, atraumatic Eyes: +icteric sclera ENT: oropharynx clear Neck: supple, nontender Lungs:  Clear throughout to auscultation.   No wheezes, crackles, or rhonchi. No acute distress. Heart:  Regular rate and rhythm; no murmurs, clicks, rubs,  or gallops. Abdomen: diffuse tenderness (greatest in RUQ and epigastric) with guarding, flat, nondistended, +BS  Rectal:  Deferred Ext: no edema  GI:  Lab Results: Recent Labs    05/02/19 1358 05/03/19 1017  WBC 6.5 4.8  HGB 11.1* 5.6*  HCT 33.0* 18.3*  PLT 122* 75*   BMET Recent Labs    05/02/19 1358 05/03/19 1017  NA 140 145  K 2.4* 2.6*  CL 102 104  CO2 28 27  GLUCOSE 94 64*  BUN 7 11  CREATININE 0.59 0.94  CALCIUM 8.7* 8.4*   LFT Recent Labs    05/03/19 1017  PROT 5.3*  ALBUMIN 2.5*  AST 83*  ALT 77*  ALKPHOS 455*  BILITOT 8.5*  BILIDIR 5.4*   PT/INR No results for input(s): LABPROT, INR in the last 72 hours.   Studies/Results: CT ABDOMEN PELVIS W CONTRAST  Result Date: 05/02/2019 CLINICAL DATA:  Colorectal carcinoma with metastasis. Patient status post RIGHT hemicolectomy and liver ablation. EXAM: CT ABDOMEN AND PELVIS WITH CONTRAST TECHNIQUE: Multidetector CT imaging of the abdomen and pelvis was performed using the standard protocol following bolus administration of intravenous contrast. CONTRAST:  67mL OMNIPAQUE IOHEXOL 300 MG/ML  SOLN COMPARISON:  CT 02/24/2019 FINDINGS: Lower chest: Lung bases are clear. Hepatobiliary: New intrahepatic and extrahepatic biliary duct dilatation. Ductal dilatation extends into the LEFT and RIGHT hepatic lobes and is moderate in severity. The duct dilatation appears to occur to the level of the  proximal common bile duct. The exact location of  the obstructed is difficult to define but appears to originate just before pancreatic head (image 19/2). 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 Additionally there is new dilatation of the gallbladder to 5 cm increased from 2 cm on prior. Moderate volume pericholecystic fluid. Within liver parenchyma there is a new low-density lesion in the caudate lobe measuring 13 mm (image 4/2.) potential new lesion adjacent to to fluid RIGHT hepatic lobe lesion measuring 14 mm (image 13/2). The third additional new lesion along the inferior aspect of the central LEFT hepatic lobe (segment 4B) measuring 13 mm on image 19/2. Pancreas: Pancreas appears grossly normal. No pancreatic duct dilatation. Spleen: Normal spleen Adrenals/urinary tract: Adrenal glands and kidneys are normal. The ureters and bladder normal. Stomach/Bowel: Stomach, duodenum and small bowel grossly normal. Post partial RIGHT hemicolectomy. No evidence of bowel obstruction. There is mild submucosal edema and mucosal enhancement through the sigmoid colon rectum which is new from prior. Vascular/Lymphatic: Abdominal aorta is normal caliber. Constriction of the RIGHT portal vein at the takeoff from the main portal vein as described the hepatic biliary findings section. No periportal or retroperitoneal adenopathy. No pelvic adenopathy. Reproductive: Uterus and necks are normal. Other: No free fluid. Musculoskeletal: No aggressive osseous lesion. IMPRESSION: 1. New biliary obstruction at the level of the mid to distal common bile duct. 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 2. Biliary obstruction involves LEFT and RIGHT hepatic lobe as well as new distension of the gallbladder. Recommend correlation with bilirubin levels as patient may require palliative  relief of obstruction. 3. Three new low-density lesions in liver concerning for hepatic metastasis. 4. Submucosal edema and mild mucosal enhancement in the LEFT colon and rectosigmoid colon is nonspecific but could represent colitis. [facility] on 05/02/2019  at12:26. Electronically Signed   By: Suzy Bouchard M.D.   On: 05/02/2019 12:26    Impression/Plan: Obstructive jaundice from extrinsic bile duct compression by malignancy. Transaminases are near normal. Doubt complete biliary obstruction but high risk for further worsening due to liver lesions. Will discuss with Dr. Therisa Doyne on 05/05/19 whether metal stent placement is feasible and if so will schedule for 05/05/19. No emergent need for ERCP today or tomorrow with minimal elevation in AST/ALT, and patient is hemodynamically stable without any signs of cholangitis. TB rising with ALP starting to decrease. Soft diet. NPO p MN on Sunday night for possible ERCP on 05/05/19. Continue Zosyn.    LOS: 0 days   Lear Ng  05/03/2019, 12:05 PM  Questions please call [phone]

## 2019-05-03 NOTE — ED Notes (Signed)
Pt husband went home

## 2019-05-03 NOTE — ED Notes (Signed)
[facility] at bedside 

## 2019-05-03 NOTE — ED Notes (Signed)
Nancy Clark at [facility] call report [phone]

## 2019-05-03 NOTE — ED Notes (Signed)
Physical signature obtained for transfer consent

## 2019-05-03 NOTE — H&P (Signed)
History and Physical    Nancy Clark [id number] DOB: 1961/03/20 DOA: 05/03/2019  Referring MD/NP/PA: Jani Gravel, MD PCP: Patient, No Pcp Per  Patient coming from: Transfer from [facility]  Chief Complaint: Abdominal pain  I have personally briefly reviewed patient's old medical records in [place]   HPI: Nancy Clark is a 59 y.o. female with medical history significant of colon cancer s/p partial colectomy in 07/2015 on chemotherapy.  Presents as a transfer for right upper quadrant abdominal pain.  She reports that symptoms have been present for couple weeks, but was relieved with current pain medication regimen.  Soon thereafter she noticed a present on the right upper side and it had became tender to the touch.  Associated symptoms include nausea, diarrhea, and weight loss of approximately 6 pounds over the last 1 to 2 weeks.  Denies having any fever, cough, shortness of breath, chest pain, vomiting, blood in stool/ change in color. her oncologist had scheduled her for a surveillance CT scan of her abdomen and pelvis yesterday.  After the scan she was told that her gallbladder was enlarged and sent to the emergency department for further evaluation.  Review of records noted CT imaging revealed new biliary obstruction at the mid to distal common bile duct concerning for compression secondary to malignancy, gallbladder distention, possible colitis, and 3 new hepatic densities concerning for metastases.  Labs significant for hem potassium 2.4, alkaline phosphatase 532, albumin 3.1, AST 116, ALT 95, total bilirubin 7.4, and direct bilirubin 5.  Transfer was requested for need of GI consultative services.  Dr. Michail Sermon of [facility] was notified and agreed to see the patient in consultation.  Patient has been accepted to a medical telemetry bed.  Patient notes that she is followed by Dr. Rogue Bussing and reports that her last chemotherapy was on 12/7  ED Course: As seen above Review of Systems    Constitutional: Positive for malaise/fatigue and weight loss. Negative for chills and fever.  HENT: Negative for congestion and nosebleeds.   Eyes: Negative for photophobia and pain.  Respiratory: Negative for hemoptysis and shortness of breath.   Cardiovascular: Negative for chest pain and leg swelling.  Gastrointestinal: Positive for abdominal pain, diarrhea and nausea. Negative for blood in stool and vomiting.  Genitourinary: Negative for dysuria and hematuria.  Musculoskeletal: Negative for joint pain.  Neurological: Negative for focal weakness and loss of consciousness.  Psychiatric/Behavioral: Negative for memory loss and substance abuse.    Past Medical History:  Diagnosis Date  . Chicken pox   . Colon cancer ([place])    Partial colectomy 07/2015 + chemo tx's  . Hypertension   . Hypokalemia   . Menopause    > 5 yrs  . Peripheral neuropathy due to chemotherapy ([place])    bi lat feet    Past Surgical History:  Procedure Laterality Date  . BREAST BIOPSY Right    2007? pt unsure done by [facility]  . COLONOSCOPY    . COLONOSCOPY WITH PROPOFOL N/A 10/14/2015   Procedure: COLONOSCOPY WITH PROPOFOL;  Surgeon: Hulen Luster, MD;  Location[facility] ENDOSCOPY;  Service: Endoscopy;  Laterality: N/A;  . COLOSTOMY REVISION Right 08/09/2015   Procedure: COLON RESECTION RIGHT;  Surgeon: Florene Glen, MD;  Location: [facility] ORS;  Service: General;  Laterality: Right;  . ECTOPIC PREGNANCY SURGERY    . IR BONE TUMOR(S)RF ABLATION  05/24/2018  . IR KYPHO LUMBAR INC FX REDUCE BONE BX UNI/BIL CANNULATION INC/IMAGING  05/24/2018  . IR  RADIOLOGIST EVAL & MGMT  05/08/2018  . IR RADIOLOGIST EVAL & MGMT  06/18/2018  . IR RADIOLOGIST EVAL & MGMT  07/31/2018  . IR RADIOLOGIST EVAL & MGMT  12/19/2018  . IR RADIOLOGIST EVAL & MGMT  02/27/2019  . PORTACATH PLACEMENT N/A 09/01/2015   Procedure: INSERTION PORT-A-CATH;  Surgeon: Florene Glen, MD;  Location: [facility] ORS;  Service: General;  Laterality: N/A;  . RADIOLOGY WITH  ANESTHESIA N/A 11/06/2018   Procedure: CT-MICROWAVE THERMAL ABLATION/LIVER;  Surgeon: Corrie Mckusick, DO;  Location: [facility] ORS;  Service: Anesthesiology;  Laterality: N/A;  . SHOULDER SURGERY Left      reports that she has quit smoking. She has a 0.50 pack-year smoking history. She has never used smokeless tobacco. She reports that she does not drink alcohol or use drugs.  No Known Allergies  Family History  Problem Relation Age of Onset  . Hypertension Mother   . Arthritis Father   . Prostate cancer Father   . Diabetes Maternal Grandmother   . Colon cancer Maternal Grandfather        colon    Prior to Admission medications   Medication Sig Start Date End Date Taking? Authorizing Provider  acetaminophen (TYLENOL) 325 MG tablet Take 2 tablets (650 mg total) by mouth every 6 (six) hours as needed for mild pain (or Fever >/= 101). 10/14/15   Gouru, Illene Silver, MD  amLODipine (NORVASC) 5 MG tablet Take 1 tablet (5 mg total) by mouth daily. 03/19/19   Lloyd Huger, MD  Calcium Carbonate-Vitamin D (CALCIUM 600/VITAMIN D PO) Take 1 tablet by mouth every evening.     [provider]  diphenoxylate-atropine (LOMOTIL) 2.5-0.025 MG tablet Take 1 tablet by mouth 4 (four) times daily as needed for diarrhea or loose stools. Take it along with immodium 04/21/19   Cammie Sickle, MD  dronabinol (MARINOL) 5 MG capsule Take 1 capsule (5 mg total) by mouth 2 (two) times daily before lunch and supper. 12/19/18   Verlon Au, NP  DULoxetine (CYMBALTA) 20 MG capsule Take 2 capsules (40 mg total) by mouth daily. Patient taking differently: Take 20 mg by mouth 2 (two) times daily.  06/10/18   Cammie Sickle, MD  feeding supplement (BOOST HIGH PROTEIN) LIQD Take 1 Container by mouth daily.     [provider]  fentaNYL (DURAGESIC) 50 MCG/HR Place 1 patch onto the skin every 3 (three) days. 04/16/19   Cammie Sickle, MD  ferrous sulfate (FERROUSUL) 325 (65 FE) MG tablet Take  1 tablet (325 mg total) by mouth daily with breakfast. Patient not taking: Reported on 04/16/2019 10/14/15   Nicholes Mango, MD  gabapentin (NEURONTIN) 300 MG capsule Take 1-2 capsules (300-600 mg total) by mouth See admin instructions. Take 1 capsule (300 mg) by mouth daily in the morning & take 2 capsules (600 mg) by mouth at night. 12/30/18   Cammie Sickle, MD  HYDROcodone-acetaminophen (NORCO/VICODIN) 5-325 MG tablet Take 1 tablet by mouth every 8 (eight) hours as needed (pain.). 04/16/19   Cammie Sickle, MD  lidocaine-prilocaine (EMLA) cream Apply 1 application topically as needed. Apply generously over the Mediport 45 minutes prior to chemotherapy. Patient taking differently: Apply 1 application topically as needed (prior to chemotherapy.).  05/27/18   Cammie Sickle, MD  loperamide (IMODIUM) 2 MG capsule Take 2 mg by mouth 4 (four) times daily as needed for diarrhea or loose stools.     [provider]  Multiple Vitamin (MULTIVITAMIN WITH MINERALS)  TABS tablet Take 1 tablet by mouth daily.    [provider]  ondansetron (ZOFRAN) 8 MG tablet One pill every 8 hours as needed for nausea/vomitting. Patient taking differently: Take 8 mg by mouth every 8 (eight) hours as needed for nausea or vomiting.  09/09/18   Cammie Sickle, MD  potassium chloride SA (K-DUR) 20 MEQ tablet Take 1 tablet (20 mEq total) by mouth 2 (two) times daily. 12/30/18   Cammie Sickle, MD  prochlorperazine (COMPAZINE) 10 MG tablet Take 1 tablet (10 mg total) by mouth every 6 (six) hours as needed for nausea or vomiting. 05/27/18   Cammie Sickle, MD    Physical Exam:  Constitutional: Thin female who appears to be in no acute distress at this time. Vitals:   05/03/19 0724  BP: (!) 157/71  Pulse: 62  Temp: 98.2 F (36.8 C)  TempSrc: Oral  SpO2: 98%   Eyes: PERRL, scleral icterus present ENMT: Mucous membranes are dry. Posterior pharynx clear of any exudate or  lesions. Normal dentition.  Neck: normal, supple, no masses, no thyromegaly Respiratory: clear to auscultation bilaterally, no wheezing, no crackles. Normal respiratory effort. No accessory muscle use.  Cardiovascular: Regular rate and rhythm, no murmurs / rubs / gallops. No extremity edema. 2+ pedal pulses. No carotid bruits.  Port present of the left upper chest wall  Abdomen: Right upper quadrant tenderness to palpation.  Bowel sounds present. Musculoskeletal: no clubbing / cyanosis. No joint deformity upper and lower extremities. Good ROM, no contractures. Normal muscle tone.  Skin: no rashes, lesions, ulcers. No induration Neurologic: CN 2-12 grossly intact. Sensation intact, DTR normal. Strength 5/5 in all 4.  Psychiatric: Normal judgment and insight. Alert and oriented x 3. Normal mood.     Labs on Admission: I have personally reviewed following labs and imaging studies  CBC: Recent Labs  Lab 05/02/19 1358  WBC 6.5  HGB 11.1*  HCT 33.0*  MCV 85.3  PLT 123XX123*   Basic Metabolic Panel: Recent Labs  Lab 04/28/19 1005 05/02/19 1358  NA 139 140  K 2.8* 2.4*  CL 103 102  CO2 30 28  GLUCOSE 93 94  BUN 9 7  CREATININE 0.58 0.59  CALCIUM 8.0* 8.7*   GFR: Estimated Creatinine Clearance: 50.7 mL/min (by C-G formula based on SCr of 0.59 mg/dL). Liver Function Tests: Recent Labs  Lab 05/02/19 1358  AST 116*  ALT 95*  ALKPHOS 532*  BILITOT 7.4*  PROT 6.4*  ALBUMIN 3.1*   Recent Labs  Lab 05/02/19 1358  LIPASE 14   No results for input(s): AMMONIA in the last 168 hours. Coagulation Profile: No results for input(s): INR, PROTIME in the last 168 hours. Cardiac Enzymes: No results for input(s): CKTOTAL, CKMB, CKMBINDEX, TROPONINI in the last 168 hours. BNP (last 3 results) No results for input(s): PROBNP in the last 8760 hours. HbA1C: No results for input(s): HGBA1C in the last 72 hours. CBG: No results for input(s): GLUCAP in the last 168 hours. Lipid  Profile: No results for input(s): CHOL, HDL, LDLCALC, TRIG, CHOLHDL, LDLDIRECT in the last 72 hours. Thyroid Function Tests: No results for input(s): TSH, T4TOTAL, FREET4, T3FREE, THYROIDAB in the last 72 hours. Anemia Panel: No results for input(s): VITAMINB12, FOLATE, FERRITIN, TIBC, IRON, RETICCTPCT in the last 72 hours. Urine analysis:    Component Value Date/Time   COLORURINE YELLOW (A) 04/21/2019 1250   APPEARANCEUR CLEAR (A) 04/21/2019 1250   LABSPEC 1.013 04/21/2019 1250   PHURINE 6.0 04/21/2019  [place] 04/21/2019 1250   HGBUR NEGATIVE 04/21/2019 [place] 04/21/2019 1250   BILIRUBINUR Negative 06/03/2015 [place] 04/21/2019 1250   PROTEINUR NEGATIVE 04/21/2019 1250   UROBILINOGEN 0.2 06/03/2015 0849   NITRITE NEGATIVE 04/21/2019 1250   LEUKOCYTESUR NEGATIVE 04/21/2019 1250   Sepsis Labs: Recent Results (from the past 240 hour(s))  SARS CORONAVIRUS 2 (TAT 6-24 HRS) Nasopharyngeal Nasopharyngeal Swab     Status: None   Collection Time: 05/02/19  8:15 PM   Specimen: Nasopharyngeal Swab  Result Value Ref Range Status   SARS Coronavirus 2 NEGATIVE NEGATIVE Final    Comment: (NOTE) SARS-CoV-2 target nucleic acids are NOT DETECTED. The SARS-CoV-2 RNA is generally detectable in upper and lower respiratory specimens during the acute phase of infection. Negative results do not preclude SARS-CoV-2 infection, do not rule out co-infections with other pathogens, and should not be used as the sole basis for treatment or other patient management decisions. Negative results must be combined with clinical observations, patient history, and epidemiological information. The expected result is Negative. Fact Sheet for Patients: SugarRoll.be Fact Sheet for Healthcare Providers: https://www.woods-mathews.com/ This test is not yet approved or cleared by the [place] FDA and  has been  authorized for detection and/or diagnosis of SARS-CoV-2 by FDA under an Emergency Use Authorization (EUA). This EUA will remain  in effect (meaning this test can be used) for the duration of the COVID-19 declaration under Section 56 4(b)(1) of the Act, 21 U.S.C. section 360bbb-3(b)(1), unless the authorization is terminated or revoked sooner. Performed at [facility], [place] [address], [place], [place] [address]      Radiological Exams on Admission: CT ABDOMEN PELVIS W CONTRAST  Result Date: 05/02/2019 CLINICAL DATA:  Colorectal carcinoma with metastasis. Patient status post RIGHT hemicolectomy and liver ablation. EXAM: CT ABDOMEN AND PELVIS WITH CONTRAST TECHNIQUE: Multidetector CT imaging of the abdomen and pelvis was performed using the standard protocol following bolus administration of intravenous contrast. CONTRAST:  62mL OMNIPAQUE IOHEXOL 300 MG/ML  SOLN COMPARISON:  CT 02/24/2019 FINDINGS: Lower chest: Lung bases are clear. Hepatobiliary: New intrahepatic and extrahepatic biliary duct dilatation. Ductal dilatation extends into the LEFT and RIGHT hepatic lobes and is moderate in severity. The duct dilatation appears to occur to the level of the proximal common bile duct. The exact location of the obstructed is difficult to define but appears to originate just before pancreatic head (image 19/2). 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 Additionally there is new dilatation of the gallbladder to 5 cm increased from 2 cm on prior. Moderate volume pericholecystic fluid. Within liver parenchyma there is a new low-density lesion in the caudate lobe measuring 13 mm (image 4/2.) potential new lesion adjacent to to fluid RIGHT hepatic lobe lesion measuring 14 mm (image 13/2). The third additional new lesion along the inferior aspect of the central LEFT hepatic lobe (segment 4B) measuring 13 mm on image 19/2. Pancreas:  Pancreas appears grossly normal. No pancreatic duct dilatation. Spleen: Normal spleen Adrenals/urinary tract: Adrenal glands and kidneys are normal. The ureters and bladder normal. Stomach/Bowel: Stomach, duodenum and small bowel grossly normal. Post partial RIGHT hemicolectomy. No evidence of bowel obstruction. There is mild submucosal edema and mucosal enhancement through the sigmoid colon rectum which is new from prior. Vascular/Lymphatic: Abdominal aorta is normal caliber. Constriction of the RIGHT portal vein at the takeoff from the main portal  vein as described the hepatic biliary findings section. No periportal or retroperitoneal adenopathy. No pelvic adenopathy. Reproductive: Uterus and necks are normal. Other: No free fluid. Musculoskeletal: No aggressive osseous lesion. IMPRESSION: 1. New biliary obstruction at the level of the mid to distal common bile duct. 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 2. Biliary obstruction involves LEFT and RIGHT hepatic lobe as well as new distension of the gallbladder. Recommend correlation with bilirubin levels as patient may require palliative relief of obstruction. 3. Three new low-density lesions in liver concerning for hepatic metastasis. 4. Submucosal edema and mild mucosal enhancement in the LEFT colon and rectosigmoid colon is nonspecific but could represent colitis. Findings conveyed to[facility] on 05/02/2019  at12:26. Electronically Signed   By: Suzy Bouchard M.D.   On: 05/02/2019 12:26    EKG: Independently reviewed. Sinus rhythm 62   Assessment/Plan Right upper quadrant abdominal pain and biliary obstruction: Acute.  Patient presented and had to have her routine CT scan of the abdomen and pelvis for follow-up of history of colon cancer at [facility].  However imaging revealed extrinsic bile duct compression by malignancy.  Transferred for need of ERCP. -Admit to a  medical telemetry bed -N.p.o. after midnight -Recheck CMP and direct bilirubin stat -Morphine IV as needed for pain  -Continue fentanyl patch -Dr. Michail Sermon of [facility] notified of the patient's arrival, we will follow-up for further recommendations.  ERCP plan for in a.m. with possible need of stent placement by Dr. Therisa Doyne on 12/14.   Elevated liver enzymes and hyperbilirubinemia: Acute.  Alkaline phosphatase 532 -> 455, AST 116-> 83, ALT 95-> 77, and total bilirubin 7.4-> 8.5.  Secondary to above. -Continue to monitor  History of colon cancer with metastases to liver: As seen on CT imaging from an outside facility.  Patient status post partial colectomy in 2017 currently been on chemotherapy followed in outpatient setting by [facility].  She was scheduled a follow-up appointment next week. -Continue fentanyl patch -Oncologist notified regarding admission -May benefit from a cognitive care consult  Possible colitis: Present on admission. -Start Zosyn empirically  Essential hypertension: Blood pressures mildly elevated up to 157/71. -Continue amlodipine  Normocytic anemia: Acute on chronic.  From yesterday noted to be 11.1, but repeat check 5 today. -Type and screen for possible need of blood product -Recheck H&H stat peripherally -Check stool guaiac  Hypokalemia: Acute.  Initial potassium noted to be 2.4 at [facility] yesterday.  Patient reportedly was given potassium replacement.  Magnesium 1.5 and potassium 2.8. -Give 2 g of magnesium sulfate IV -Give potassium chloride 50 mEq IV and 40 mEq p.o. -Continue home supplement double dose of 20 mEq 4 times daily in a.m.  Thrombocytopenia: Platelet count 122.  Likely secondary to chemotherapy.  DVT prophylaxis: Lovenox Code Status: Full Family Communication: This plan of care with the patient and her husband was also present at bedside. Disposition Plan: likely discharge home in 2 to 3 days  Consults called:  Gastroenterology Admission status: Inpatient   Norval Morton MD [facility] Pager [phone]   If 7PM-7AM, please contact night-coverage [email or web address] Password [email or web address]  05/03/2019, 8:50 AM

## 2019-05-03 NOTE — ED Notes (Signed)
Spoke with dr. Cinda Quest regarding hypokalemia again, verbal orders for kcl received.

## 2019-05-03 NOTE — ED Notes (Signed)
EMTALA reviewed by charge RN 

## 2019-05-04 LAB — COMPREHENSIVE METABOLIC PANEL
ALT: 81 U/L — ABNORMAL HIGH (ref 0–44)
AST: 112 U/L — ABNORMAL HIGH (ref 15–41)
Albumin: 2.6 g/dL — ABNORMAL LOW (ref 3.5–5.0)
Alkaline Phosphatase: 465 U/L — ABNORMAL HIGH (ref 38–126)
Anion gap: 9 (ref 5–15)
BUN: 8 mg/dL (ref 6–20)
CO2: 26 mmol/L (ref 22–32)
Calcium: 8.5 mg/dL — ABNORMAL LOW (ref 8.9–10.3)
Chloride: 106 mmol/L (ref 98–111)
Creatinine, Ser: 0.93 mg/dL (ref 0.44–1.00)
GFR calc Af Amer: >60 mL/min (ref >60)
GFR calc non Af Amer: >60 mL/min (ref >60)
Glucose, Bld: 92 mg/dL (ref 70–99)
Potassium: 3.7 mmol/L (ref 3.5–5.1)
Sodium: 141 mmol/L (ref 135–145)
Total Bilirubin: 7.6 mg/dL — ABNORMAL HIGH (ref 0.3–1.2)
Total Protein: 5.8 g/dL — ABNORMAL LOW (ref 6.5–8.1)

## 2019-05-04 LAB — CBC
HCT: 32.8 % — ABNORMAL LOW (ref 36.0–46.0)
Hemoglobin: 10.6 g/dL — ABNORMAL LOW (ref 12.0–15.0)
MCH: 29.2 pg (ref 26.0–34.0)
MCHC: 32.3 g/dL (ref 30.0–36.0)
MCV: 90.4 fL (ref 80.0–100.0)
Platelets: 137 10*3/uL — ABNORMAL LOW (ref 150–400)
RBC: 3.63 MIL/uL — ABNORMAL LOW (ref 3.87–5.11)
RDW: 20.7 % — ABNORMAL HIGH (ref 11.5–15.5)
WBC: 8.4 10*3/uL (ref 4.0–10.5)
nRBC: 0 % (ref 0.0–0.2)

## 2019-05-04 LAB — SURGICAL PCR SCREEN
MRSA, PCR: NEGATIVE
Staphylococcus aureus: NEGATIVE

## 2019-05-04 MED ORDER — HYDRALAZINE HCL 20 MG/ML IJ SOLN
10.0000 mg | Freq: Four times a day (QID) | INTRAMUSCULAR | Status: DC | PRN
  Administered 2019-05-04 – 2019-05-06 (×4): 10 mg via INTRAVENOUS
  Filled 2019-05-04 (×4): qty 1

## 2019-05-04 MED ORDER — SODIUM CHLORIDE 0.9 % IV SOLN: INTRAVENOUS | Status: DC

## 2019-05-04 MED ORDER — ACETAMINOPHEN 325 MG PO TABS
650.0000 mg | ORAL_TABLET | ORAL | Status: DC | PRN
  Administered 2019-05-04 – 2019-05-06 (×3): 650 mg via ORAL
  Filled 2019-05-04 (×3): qty 2

## 2019-05-04 MED ORDER — MUPIROCIN 2 % EX OINT: 1.0000 "application " | TOPICAL_OINTMENT | Freq: Two times a day (BID) | CUTANEOUS | Status: DC

## 2019-05-04 NOTE — Progress Notes (Signed)
PROGRESS NOTE    Nancy Clark  VOH:[id number] DOB: [birth year] DOA: 05/03/2019 PCP: Patient, No Pcp Per   Brief Narrative:  Nancy Clark is a 58 y.o. female with medical history significant of colon cancer s/p partial colectomy in 07/2015 on chemotherapy.  Presents as a transfer for right upper quadrant abdominal pain.  She reports that symptoms have been present for couple weeks, but was relieved with current pain medication regimen.  Soon thereafter she noticed a present on the right upper side and it had became tender to the touch.  Associated symptoms include nausea, diarrhea, and weight loss of approximately 6 pounds over the last 1 to 2 weeks.  Denies having any fever, cough, shortness of breath, chest pain, vomiting, blood in stool/ change in color. her oncologist had scheduled her for a surveillance CT scan of her abdomen and pelvis yesterday.  After the scan she was told that her gallbladder was enlarged and sent to the emergency department for further evaluation.  Review of records noted CT imaging revealed new biliary obstruction at the mid to distal common bile duct concerning for compression secondary to malignancy, gallbladder distention, possible colitis, and 3 new hepatic densities concerning for metastases.  Labs significant for hem potassium 2.4, alkaline phosphatase 532, albumin 3.1, AST 116, ALT 95, total bilirubin 7.4, and direct bilirubin 5.  Transfer was requested for need of GI consultative services.  Dr. Michail Sermon of [facility] was notified and agreed to see the patient in consultation.  Patient has been accepted to a medical telemetry bed.  Patient notes that she is followed by Dr. Rogue Bussing and reports that her last chemotherapy was on 12/7   Assessment & Plan:   Principal Problem:   Biliary obstruction Active Problems:   Hypokalemia   Metastases to the liver [facility])   History of colon cancer   Hyperbilirubinemia   Elevated liver enzymes   Thrombocytopenia (HCC)  Normocytic anemia   Essential hypertension   Right upper quadrant abdominal pain with biliary obstruction secondary to compressive mechanics from malignancy. -Patient was seen by GI in consultation will evaluate tomorrow for possible stent placement "Dr. Michail Sermon of [facility] notified of the patient's arrival, we will follow-up for further recommendations.  ERCP plan for in a.m. with possible need of stent placement by Dr. Therisa Doyne on 12/14" -N.p.o. after midnight -Monitor LFTs alk phos and total bili are elevated  History of colon cancer with metastatic liver disease -Continue pain management -Oncology aware of admission  Possible colitis noted to be present on admission -Zosyn was started empirically will continue for now given patient's intra-abdominal pathology as noted above  Essential hypertension: -Continue amlodipine reasonably well controlled currently -Continue to monitor titrate as needed  Acute on chronic anemia: -Patient hemoglobin remained stable at 9.7> 10> 10.6 -Initially noted to be 5 -Occult fecal pending  Hypokalemia acute -Patient received magnesium, -Potassium 3.7 today  DVT prophylaxis: Lovenox SQ  Code Status: full    Code Status Orders  (From admission, onward)         Start     Ordered   05/03/19 0901  Full code  Continuous     05/03/19 0902        Code Status History    Date Active Date Inactive Code Status Order ID Comments User Context   10/12/2015 1802 10/14/2015 2058 Full Code [id number]  Theodoro Grist, MD ED   08/09/2015 1153 08/14/2015 2015 Full Code [id number]  Florene Glen, MD Inpatient   Advance  Care Planning Activity     Family Communication: family at bedside  Disposition Plan:   Patient remained inpatient for further subspecialty evaluation with plans procedural intervention in the morning, she is not yet stable medically ready for discharge Consults called: None Admission status: Inpatient   Consultants:    Gastroenterology  Procedures:  CT ABDOMEN PELVIS W CONTRAST  Result Date: 05/02/2019 CLINICAL DATA:  Colorectal carcinoma with metastasis. Patient status post RIGHT hemicolectomy and liver ablation. EXAM: CT ABDOMEN AND PELVIS WITH CONTRAST TECHNIQUE: Multidetector CT imaging of the abdomen and pelvis was performed using the standard protocol following bolus administration of intravenous contrast. CONTRAST:  16m OMNIPAQUE IOHEXOL 300 MG/ML  SOLN COMPARISON:  CT 02/24/2019 FINDINGS: Lower chest: Lung bases are clear. Hepatobiliary: New intrahepatic and extrahepatic biliary duct dilatation. Ductal dilatation extends into the LEFT and RIGHT hepatic lobes and is moderate in severity. The duct dilatation appears to occur to the level of the proximal common bile duct. The exact location of the obstructed is difficult to define but appears to originate just before pancreatic head (image 19/2). 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 Additionally there is new dilatation of the gallbladder to 5 cm increased from 2 cm on prior. Moderate volume pericholecystic fluid. Within liver parenchyma there is a new low-density lesion in the caudate lobe measuring 13 mm (image 4/2.) potential new lesion adjacent to to fluid RIGHT hepatic lobe lesion measuring 14 mm (image 13/2). The third additional new lesion along the inferior aspect of the central LEFT hepatic lobe (segment 4B) measuring 13 mm on image 19/2. Pancreas: Pancreas appears grossly normal. No pancreatic duct dilatation. Spleen: Normal spleen Adrenals/urinary tract: Adrenal glands and kidneys are normal. The ureters and bladder normal. Stomach/Bowel: Stomach, duodenum and small bowel grossly normal. Post partial RIGHT hemicolectomy. No evidence of bowel obstruction. There is mild submucosal edema and mucosal enhancement through the sigmoid colon rectum which is new from prior.  Vascular/Lymphatic: Abdominal aorta is normal caliber. Constriction of the RIGHT portal vein at the takeoff from the main portal vein as described the hepatic biliary findings section. No periportal or retroperitoneal adenopathy. No pelvic adenopathy. Reproductive: Uterus and necks are normal. Other: No free fluid. Musculoskeletal: No aggressive osseous lesion. IMPRESSION: 1. New biliary obstruction at the level of the mid to distal common bile duct. 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 2. Biliary obstruction involves LEFT and RIGHT hepatic lobe as well as new distension of the gallbladder. Recommend correlation with bilirubin levels as patient may require palliative relief of obstruction. 3. Three new low-density lesions in liver concerning for hepatic metastasis. 4. Submucosal edema and mild mucosal enhancement in the LEFT colon and rectosigmoid colon is nonspecific but could represent colitis. Findings conveyed toG[facility] 05/02/2019  at12:26. Electronically Signed   By: SSuzy BouchardM.D.   On: 05/02/2019 12:26     Antimicrobials:   Zosyn   Subjective: Patient reports mild right upper quadrant abdominal pain otherwise no acute events overnight  Objective: Vitals:   05/04/19 0345 05/04/19 0348 05/04/19 0715 05/04/19 1207  BP: 136/89  (!) 149/70 (!) 172/84  Pulse: 69  68 60  Resp: _0 Temp: 97.7 F (36.5 C)  98.2 F (36.8 C) 98 F (36.7 C)  TempSrc: Oral  Oral Oral  SpO2: 100%  98% 100%  Weight:  40.5  kg      Intake/Output Summary (Last 24 hours) at 05/04/2019 1234 Last data filed at 05/04/2019 0900 Gross per 24 hour  Intake 2388.57 ml  Output 201 ml  Net 2187.57 ml   Filed Weights   05/04/19 0348  Weight: 40.5 kg    Examination:  General exam: Appears calm and comfortable  Respiratory system: Clear to auscultation. Respiratory effort normal. Cardiovascular system:  S1 & S2 heard, RRR. No JVD, murmurs, rubs, gallops or clicks. No pedal edema. Gastrointestinal system: Abdomen is nondistended, soft and mildly tender ruq. No organomegaly or masses felt. Normal bowel sounds heard. Central nervous system: Alert and oriented. No focal neurological deficits. Extremities: Warm well perfused, no edema Skin: No rashes, lesions or ulcers Psychiatry: Judgement and insight appear normal. Mood & affect appropriate.     Data Reviewed: I have personally reviewed following labs and imaging studies  CBC: Recent Labs  Lab 05/02/19 1358 05/03/19 1017 05/03/19 1144 05/03/19 1917 05/04/19 0426  WBC 6.5 4.8  --   --  8.4  NEUTROABS  --  3.9  --   --   --   HGB 11.1* 5.6* 9.7* 10.0* 10.6*  HCT 33.0* 18.3* 29.9* 30.9* 32.8*  MCV 85.3 94.8  --   --  90.4  PLT 122* 75*  --   --  915*   Basic Metabolic Panel: Recent Labs  Lab 04/28/19 1005 05/02/19 1358 05/03/19 1017 05/04/19 0426  NA 139 140 145 141  K 2.8* 2.4* 2.6* 3.7  CL 103 102 104 106  CO2 _0 GLUCOSE 93 94 64* 92  BUN _1 CREATININE 0.58 0.59 0.94 0.93  CALCIUM 8.0* 8.7* 8.4* 8.5*  MG  --   --  1.5*  --    GFR: Estimated Creatinine Clearance: 43.7 mL/min (by C-G formula based on SCr of 0.93 mg/dL). Liver Function Tests: Recent Labs  Lab 05/02/19 1358 05/03/19 1017 05/04/19 0426  AST 116* 83* 112*  ALT 95* 77* 81*  ALKPHOS 532* 455* 465*  BILITOT 7.4* 8.5* 7.6*  PROT 6.4* 5.3* 5.8*  ALBUMIN 3.1* 2.5* 2.6*   Recent Labs  Lab 05/02/19 1358  LIPASE 14   No results for input(s): AMMONIA in the last 168 hours. Coagulation Profile: No results for input(s): INR, PROTIME in the last 168 hours. Cardiac Enzymes: No results for input(s): CKTOTAL, CKMB, CKMBINDEX, TROPONINI in the last 168 hours. BNP (last 3 results) No results for input(s): PROBNP in the last 8760 hours. HbA1C: No results for input(s): HGBA1C in the last 72 hours. CBG: No results for input(s): GLUCAP in  the last 168 hours. Lipid Profile: No results for input(s): CHOL, HDL, LDLCALC, TRIG, CHOLHDL, LDLDIRECT in the last 72 hours. Thyroid Function Tests: No results for input(s): TSH, T4TOTAL, FREET4, T3FREE, THYROIDAB in the last 72 hours. Anemia Panel: No results for input(s): VITAMINB12, FOLATE, FERRITIN, TIBC, IRON, RETICCTPCT in the last 72 hours. Sepsis Labs: No results for input(s): PROCALCITON, LATICACIDVEN in the last 168 hours.  Recent Results (from the past 240 hour(s))  SARS CORONAVIRUS 2 (TAT 6-24 HRS) Nasopharyngeal Nasopharyngeal Swab     Status: None   Collection Time: 05/02/19  8:15 PM   Specimen: Nasopharyngeal Swab  Result Value Ref Range Status   SARS Coronavirus 2 NEGATIVE NEGATIVE Final    Comment: (NOTE) SARS-CoV-2 target nucleic acids are NOT DETECTED. The SARS-CoV-2 RNA is generally detectable in upper and lower respiratory specimens during the acute phase of infection.  Negative results do not preclude SARS-CoV-2 infection, do not rule out co-infections with other pathogens, and should not be used as the sole basis for treatment or other patient management decisions. Negative results must be combined with clinical observations, patient history, and epidemiological information. The expected result is Negative. Fact Sheet for Patients: SugarRoll.be Fact Sheet for Healthcare Providers: https://www.woods-mathews.com/ This test is not yet approved or cleared by the [place] FDA and  has been authorized for detection and/or diagnosis of SARS-CoV-2 by FDA under an Emergency Use Authorization (EUA). This EUA will remain  in effect (meaning this test can be used) for the duration of the COVID-19 declaration under Section 56 4(b)(1) of the Act, 21 U.S.C. section 360bbb-3(b)(1), unless the authorization is terminated or revoked sooner. Performed at [facility], [place] [address] [place], [place] [address]           Radiology Studies: No results found.      Scheduled Meds: . sodium chloride   Intravenous Once  . amLODipine  5 mg Oral Daily  . Chlorhexidine Gluconate Cloth  6 each Topical Daily  . dronabinol  5 mg Oral BID AC  . enoxaparin (LOVENOX) injection  30 mg Subcutaneous Q24H  . feeding supplement (ENSURE ENLIVE)  237 mL Oral Q2000  . [START ON 05/05/2019] fentaNYL  1 patch Transdermal Q72H  . gabapentin  300 mg Oral Daily  . gabapentin  600 mg Oral QHS  . potassium chloride SA  20 mEq Oral QID  . sodium chloride flush  10-40 mL Intracatheter Q12H  . sodium chloride flush  3 mL Intravenous Q12H   Continuous Infusions: . sodium chloride 75 mL/hr at 05/03/19 1015  . sodium chloride    . piperacillin-tazobactam 3.375 g (05/04/19 0829)     LOS: 1 day    Time spent: 41 min     Nicolette Bang, MD [facility]  If 7PM-7AM, please contact night-coverage  05/04/2019, 12:34 PM

## 2019-05-04 NOTE — H&P (View-Only) (Signed)
[facility] Gastroenterology Progress Note  Nancy Clark 58 y.o. 06-May-1961   Subjective: Feels ok. Resting but easily arousable. Denies N/V/abdominal pain.  Objective: Vital signs: Vitals:   05/04/19 0715 05/04/19 1207  BP: (!) 149/70 (!) 172/84  Pulse: 68 60  Resp: 18 18  Temp: 98.2 F (36.8 C) 98 F (36.7 C)  SpO2: 98% 100%    Physical Exam: Gen: lethargic, thin, no acute distress  HEENT: +icteric sclera CV: RRR Chest: CTA B Abd: diffuse tenderness with guarding, thin, +BS Ext: no edema  Lab Results: Recent Labs    05/03/19 1017 05/04/19 0426  NA 145 141  K 2.6* 3.7  CL 104 106  CO2 27 26  GLUCOSE 64* 92  BUN 11 8  CREATININE 0.94 0.93  CALCIUM 8.4* 8.5*  MG 1.5*  --    Recent Labs    05/03/19 1017 05/04/19 0426  AST 83* 112*  ALT 77* 81*  ALKPHOS 455* 465*  BILITOT 8.5* 7.6*  PROT 5.3* 5.8*  ALBUMIN 2.5* 2.6*   Recent Labs    05/03/19 1017 05/03/19 1917 05/04/19 0426  WBC 4.8  --  8.4  NEUTROABS 3.9  --   --   HGB 5.6* 10.0* 10.6*  HCT 18.3* 30.9* 32.8*  MCV 94.8  --  90.4  PLT 75*  --  137*      Assessment/Plan: Obstructive jaundice with malignant extrinsic compression of bile duct in need of an ERCP for metal stent placement. Slight increase in LFTs. NPO p MN. Continue Zosyn. Supportive care.   Nancy Clark 05/04/2019, 12:49 PM  Questions please call [phone] ID: Nancy Clark, female   DOB: April 20, 1961, 58 y.o.   MRN: [id number]

## 2019-05-04 NOTE — Progress Notes (Signed)
[facility] Gastroenterology Progress Note  Nancy Clark 58 y.o. Jan 13, 1961   Subjective: Feels ok. Resting but easily arousable. Denies N/V/abdominal pain.  Objective: Vital signs: Vitals:   05/04/19 0715 05/04/19 1207  BP: (!) 149/70 (!) 172/84  Pulse: 68 60  Resp: 18 18  Temp: 98.2 F (36.8 C) 98 F (36.7 C)  SpO2: 98% 100%    Physical Exam: Gen: lethargic, thin, no acute distress  HEENT: +icteric sclera CV: RRR Chest: CTA B Abd: diffuse tenderness with guarding, thin, +BS Ext: no edema  Lab Results: Recent Labs    05/03/19 1017 05/04/19 0426  NA 145 141  K 2.6* 3.7  CL 104 106  CO2 27 26  GLUCOSE 64* 92  BUN 11 8  CREATININE 0.94 0.93  CALCIUM 8.4* 8.5*  MG 1.5*  --    Recent Labs    05/03/19 1017 05/04/19 0426  AST 83* 112*  ALT 77* 81*  ALKPHOS 455* 465*  BILITOT 8.5* 7.6*  PROT 5.3* 5.8*  ALBUMIN 2.5* 2.6*   Recent Labs    05/03/19 1017 05/03/19 1917 05/04/19 0426  WBC 4.8  --  8.4  NEUTROABS 3.9  --   --   HGB 5.6* 10.0* 10.6*  HCT 18.3* 30.9* 32.8*  MCV 94.8  --  90.4  PLT 75*  --  137*      Assessment/Plan: Obstructive jaundice with malignant extrinsic compression of bile duct in need of an ERCP for metal stent placement. Slight increase in LFTs. NPO p MN. Continue Zosyn. Supportive care.   Lear Ng 05/04/2019, 12:49 PM  Questions please call [phone] ID: Nancy Clark, female   DOB: Mar 23, 1961, 58 y.o.   MRN: [id number]

## 2019-05-05 ENCOUNTER — Inpatient Hospital Stay (HOSPITAL_COMMUNITY): Payer: BC Managed Care – PPO | Admitting: Anesthesiology

## 2019-05-05 ENCOUNTER — Encounter (HOSPITAL_COMMUNITY): Payer: Self-pay | Admitting: Internal Medicine

## 2019-05-05 ENCOUNTER — Inpatient Hospital Stay: Payer: BC Managed Care – PPO | Admitting: Internal Medicine

## 2019-05-05 ENCOUNTER — Encounter (HOSPITAL_COMMUNITY)
Admission: AD | Disposition: A | Discharge status: Home / Self Care | Payer: Self-pay | Source: Other Acute Inpatient Hospital | Attending: Internal Medicine

## 2019-05-05 ENCOUNTER — Inpatient Hospital Stay: Payer: BC Managed Care – PPO

## 2019-05-05 ENCOUNTER — Inpatient Hospital Stay (HOSPITAL_COMMUNITY): Payer: BC Managed Care – PPO

## 2019-05-05 ENCOUNTER — Telehealth: Payer: Self-pay | Admitting: Internal Medicine

## 2019-05-05 HISTORY — PX: ERCP: SHX5425

## 2019-05-05 HISTORY — PX: REMOVAL OF STONES: SHX5545

## 2019-05-05 HISTORY — PX: PANCREATIC STENT PLACEMENT: SHX5539

## 2019-05-05 HISTORY — PX: BILIARY STENT PLACEMENT: SHX5538

## 2019-05-05 HISTORY — PX: SPHINCTEROTOMY: SHX5544

## 2019-05-05 LAB — CBC WITH DIFFERENTIAL/PLATELET
Abs Immature Granulocytes: 0.11 10*3/uL — ABNORMAL HIGH (ref 0.00–0.07)
Basophils Absolute: 0 10*3/uL (ref 0.0–0.1)
Basophils Relative: 0 %
Eosinophils Absolute: 0.1 10*3/uL (ref 0.0–0.5)
Eosinophils Relative: 1 %
HCT: 30.4 % — ABNORMAL LOW (ref 36.0–46.0)
Hemoglobin: 10.1 g/dL — ABNORMAL LOW (ref 12.0–15.0)
Immature Granulocytes: 2 %
Lymphocytes Relative: 13 %
Lymphs Abs: 0.9 10*3/uL (ref 0.7–4.0)
MCH: 28.9 pg (ref 26.0–34.0)
MCHC: 33.2 g/dL (ref 30.0–36.0)
MCV: 86.9 fL (ref 80.0–100.0)
Monocytes Absolute: 0.7 10*3/uL (ref 0.1–1.0)
Monocytes Relative: 10 %
Neutro Abs: 4.8 10*3/uL (ref 1.7–7.7)
Neutrophils Relative %: 74 %
Platelets: 133 10*3/uL — ABNORMAL LOW (ref 150–400)
RBC: 3.5 MIL/uL — ABNORMAL LOW (ref 3.87–5.11)
RDW: 20.9 % — ABNORMAL HIGH (ref 11.5–15.5)
WBC: 6.6 10*3/uL (ref 4.0–10.5)
nRBC: 0 % (ref 0.0–0.2)

## 2019-05-05 LAB — COMPREHENSIVE METABOLIC PANEL
ALT: 96 U/L — ABNORMAL HIGH (ref 0–44)
AST: 141 U/L — ABNORMAL HIGH (ref 15–41)
Albumin: 2.3 g/dL — ABNORMAL LOW (ref 3.5–5.0)
Alkaline Phosphatase: 472 U/L — ABNORMAL HIGH (ref 38–126)
Anion gap: 9 (ref 5–15)
BUN: <5 mg/dL — ABNORMAL LOW (ref 6–20)
CO2: 24 mmol/L (ref 22–32)
Calcium: 8.7 mg/dL — ABNORMAL LOW (ref 8.9–10.3)
Chloride: 109 mmol/L (ref 98–111)
Creatinine, Ser: 0.82 mg/dL (ref 0.44–1.00)
GFR calc Af Amer: >60 mL/min (ref >60)
GFR calc non Af Amer: >60 mL/min (ref >60)
Glucose, Bld: 93 mg/dL (ref 70–99)
Potassium: 4.1 mmol/L (ref 3.5–5.1)
Sodium: 142 mmol/L (ref 135–145)
Total Bilirubin: 8 mg/dL — ABNORMAL HIGH (ref 0.3–1.2)
Total Protein: 5.2 g/dL — ABNORMAL LOW (ref 6.5–8.1)

## 2019-05-05 SURGERY — ERCP, WITH INTERVENTION IF INDICATED
Anesthesia: General

## 2019-05-05 MED ORDER — DULOXETINE HCL 20 MG PO CPEP
20.0000 mg | ORAL_CAPSULE | Freq: Two times a day (BID) | ORAL | Status: DC
  Administered 2019-05-06 (×2): 20 mg via ORAL
  Filled 2019-05-05 (×3): qty 1

## 2019-05-05 MED ORDER — PHENYLEPHRINE 40 MCG/ML (10ML) SYRINGE FOR IV PUSH (FOR BLOOD PRESSURE SUPPORT)
PREFILLED_SYRINGE | INTRAVENOUS | Status: DC | PRN
  Administered 2019-05-05: 180 ug via INTRAVENOUS

## 2019-05-05 MED ORDER — ESMOLOL HCL 100 MG/10ML IV SOLN
INTRAVENOUS | Status: DC | PRN
  Administered 2019-05-05 (×2): 20 mg via INTRAVENOUS
  Administered 2019-05-05 (×2): 30 mg via INTRAVENOUS

## 2019-05-05 MED ORDER — PROPOFOL 10 MG/ML IV BOLUS
INTRAVENOUS | Status: DC | PRN
  Administered 2019-05-05: 30 mg via INTRAVENOUS
  Administered 2019-05-05: 140 mg via INTRAVENOUS
  Administered 2019-05-05: 20 mg via INTRAVENOUS

## 2019-05-05 MED ORDER — LACTATED RINGERS IV SOLN
INTRAVENOUS | Status: DC | PRN
  Administered 2019-05-05: 12:00:00 via INTRAVENOUS

## 2019-05-05 MED ORDER — DEXAMETHASONE SODIUM PHOSPHATE 10 MG/ML IJ SOLN
INTRAMUSCULAR | Status: DC | PRN
  Administered 2019-05-05: 5 mg via INTRAVENOUS

## 2019-05-05 MED ORDER — HYDRALAZINE HCL 20 MG/ML IJ SOLN
INTRAMUSCULAR | Status: AC
  Filled 2019-05-05: qty 1

## 2019-05-05 MED ORDER — KETOROLAC TROMETHAMINE 15 MG/ML IJ SOLN
15.0000 mg | Freq: Once | INTRAMUSCULAR | Status: AC
  Administered 2019-05-05: 15 mg via INTRAVENOUS
  Filled 2019-05-05: qty 1

## 2019-05-05 MED ORDER — HYDROCODONE-ACETAMINOPHEN 5-325 MG PO TABS
1.0000 | ORAL_TABLET | Freq: Three times a day (TID) | ORAL | Status: DC | PRN
  Administered 2019-05-05: 1 via ORAL
  Filled 2019-05-05: qty 1

## 2019-05-05 MED ORDER — FENTANYL CITRATE (PF) 100 MCG/2ML IJ SOLN
INTRAMUSCULAR | Status: DC | PRN
  Administered 2019-05-05: 25 ug via INTRAVENOUS
  Administered 2019-05-05: 75 ug via INTRAVENOUS
  Administered 2019-05-05: 50 ug via INTRAVENOUS

## 2019-05-05 MED ORDER — METOPROLOL TARTRATE 5 MG/5ML IV SOLN
INTRAVENOUS | Status: DC | PRN
  Administered 2019-05-05: 1 mg via INTRAVENOUS
  Administered 2019-05-05: 2.5 mg via INTRAVENOUS

## 2019-05-05 MED ORDER — INDOMETHACIN 50 MG RE SUPP
RECTAL | Status: AC
  Filled 2019-05-05: qty 2

## 2019-05-05 MED ORDER — GLUCAGON HCL RDNA (DIAGNOSTIC) 1 MG IJ SOLR
INTRAMUSCULAR | Status: AC
  Filled 2019-05-05: qty 1

## 2019-05-05 MED ORDER — SUCCINYLCHOLINE CHLORIDE 200 MG/10ML IV SOSY
PREFILLED_SYRINGE | INTRAVENOUS | Status: DC | PRN
  Administered 2019-05-05: 80 mg via INTRAVENOUS

## 2019-05-05 MED ORDER — MIDAZOLAM HCL 5 MG/5ML IJ SOLN
INTRAMUSCULAR | Status: DC | PRN
  Administered 2019-05-05 (×2): 1 mg via INTRAVENOUS

## 2019-05-05 MED ORDER — HYDRALAZINE HCL 20 MG/ML IJ SOLN
5.0000 mg | INTRAMUSCULAR | Status: DC | PRN
  Administered 2019-05-05 (×2): 5 mg via INTRAVENOUS

## 2019-05-05 MED ORDER — SODIUM CHLORIDE 0.9 % IV SOLN
INTRAVENOUS | Status: DC | PRN
  Administered 2019-05-05: 35 mL

## 2019-05-05 MED ORDER — CLONIDINE HCL 0.1 MG/24HR TD PTWK
0.1000 mg | MEDICATED_PATCH | TRANSDERMAL | Status: DC
  Administered 2019-05-05: 0.1 mg via TRANSDERMAL
  Filled 2019-05-05: qty 1

## 2019-05-05 MED ORDER — ONDANSETRON HCL 4 MG/2ML IJ SOLN
INTRAMUSCULAR | Status: DC | PRN
  Administered 2019-05-05: 4 mg via INTRAVENOUS

## 2019-05-05 MED ORDER — LIDOCAINE 2% (20 MG/ML) 5 ML SYRINGE
INTRAMUSCULAR | Status: DC | PRN
  Administered 2019-05-05: 40 mg via INTRAVENOUS

## 2019-05-05 MED ORDER — PROPOFOL 500 MG/50ML IV EMUL
INTRAVENOUS | Status: DC | PRN
  Administered 2019-05-05: 50 ug/kg/min via INTRAVENOUS

## 2019-05-05 MED ORDER — ACETAMINOPHEN 325 MG PO TABS: 650.0000 mg | ORAL_TABLET | Freq: Four times a day (QID) | ORAL | Status: DC | PRN

## 2019-05-05 MED ORDER — HYDRALAZINE HCL 20 MG/ML IJ SOLN
INTRAMUSCULAR | Status: DC | PRN
  Administered 2019-05-05: 5 mg via INTRAVENOUS

## 2019-05-05 NOTE — Anesthesia Preprocedure Evaluation (Addendum)
Anesthesia Evaluation  Patient identified by MRN, date of birth, ID band Patient awake    Reviewed: Allergy & Precautions, NPO status , Patient's Chart, lab work & pertinent test results  History of Anesthesia Complications Negative for: history of anesthetic complications  Airway Mallampati: II  TM Distance: >3 FB Neck ROM: Full    Dental  (+) Dental Advisory Given, Poor Dentition, Chipped, Missing,    Pulmonary former smoker,    Pulmonary exam normal        Cardiovascular hypertension, Pt. on medications (-) anginaNormal cardiovascular exam     Neuro/Psych negative neurological ROS  negative psych ROS   GI/Hepatic Neg liver ROS,  Metastatic colon cancer Jaundice    Endo/Other  negative endocrine ROS  Renal/GU negative Renal ROS     Musculoskeletal negative musculoskeletal ROS (+)   Abdominal   Peds  Hematology  (+) anemia ,  Thrombocytopenia    Anesthesia Other Findings Covid neg 12/11   Reproductive/Obstetrics                            Anesthesia Physical Anesthesia Plan  ASA: III  Anesthesia Plan: General   Post-op Pain Management:    Induction: Intravenous  PONV Risk Score and Plan: 3 and Treatment may vary due to age or medical condition, Ondansetron and Dexamethasone  Airway Management Planned: Oral ETT  Additional Equipment: None  Intra-op Plan:   Post-operative Plan: Extubation in OR  Informed Consent: I have reviewed the patients History and Physical, chart, labs and discussed the procedure including the risks, benefits and alternatives for the proposed anesthesia with the patient or authorized representative who has indicated his/her understanding and acceptance.     Dental advisory given  Plan Discussed with: CRNA and Anesthesiologist  Anesthesia Plan Comments:        Anesthesia Quick Evaluation

## 2019-05-05 NOTE — Op Note (Signed)
ERCP was performed for obstructive jaundice thought to be related to extrinsic compression on the mid and distal CBD in a patient with metastatic colon cancer and liver masses and dilated gallbladder.   Findings: Ampulla appeared unremarkable, initial cannulation was challenging. As a wire advanced into the pancreatic duct, plastic pancreatic stent was deployed, 5 cm 4 [place]. Subsequently after multiple attempts the CBD was deeply cannulated. Cholangiogram revealed a dilated proximal CBD and intrahepatics with severe luminal narrowing of mid and distal CBD. Sphincterotomy was performed. Balloon sweep was performed with 9 mm and 12 mm balloon, however it could not be pulled out through the distal CBD, except in a deflated position. Large amount of dark bile was noted post balloon sweeps. An uncovered metal stent, 10 mm x 60 mm was deployed in the common bile duct with good flow of bile noted through the stent and in good position. The pancreatic duct stent appeared to have nearly fallen off during CBD cannulation and CBD stent placement, and was subsequently removed at the end of the procedure with a snare.  The pancreatic duct was never injected during the entire procedure. Patient was given rectal indomethacin postprocedure.  Recommendations: Clear liquid diet Follow-up liver enzymes in a.m.. Advance diet as tolerated. To follow-up with her oncologist as an outpatient. Consider percutaneous drainage of gallbladder/surgical evaluation if patient has persistent abdominal pain and gallbladder remains distended.  Gari Crown

## 2019-05-05 NOTE — Progress Notes (Signed)
Pt. not as sleepy and drowsy, ambulated to the bathroom, however BP still high.PRN given, noting else to give. Paged MD.

## 2019-05-05 NOTE — Progress Notes (Signed)
Patient back from endoscopy with high blood pressure, please see flow sheet, sleepy and drowsy. Informed  MD Kamineni. She is on the floor, will come and see the patient.

## 2019-05-05 NOTE — Plan of Care (Signed)
  Problem: Education: Goal: Knowledge of General Education information will improve Description: Including pain rating scale, medication(s)/side effects and non-pharmacologic comfort measures Outcome: Progressing   Problem: Safety: Goal: Ability to remain free from injury will improve Outcome: Progressing   

## 2019-05-05 NOTE — Progress Notes (Signed)
Patient is alert and oriented, but drowsy and sleepy. Patient is refusing any drinks at this time. Mother at beside. BP is improving slightly. Do not feel comfortable giving her any medications PO. She also has potassium ordered 4 times daily. Missed second dose while she was In procedure and third one due to not drinking and being drowsy. Paged MD and informed her.

## 2019-05-05 NOTE — Anesthesia Procedure Notes (Signed)
Procedure Name: Intubation Date/Time: 05/05/2019 11:59 AM Performed by: Imagene Riches, CRNA Pre-anesthesia Checklist: Patient identified, Emergency Drugs available, Suction available and Patient being monitored Patient Re-evaluated:Patient Re-evaluated prior to induction Oxygen Delivery Method: Circle System Utilized Preoxygenation: Pre-oxygenation with 100% oxygen Induction Type: IV induction Ventilation: Mask ventilation without difficulty Laryngoscope Size: Miller and 2 Grade View: Grade I Tube type: Oral Tube size: 7.0 mm Number of attempts: 1 Airway Equipment and Method: Stylet and Oral airway Placement Confirmation: ETT inserted through vocal cords under direct vision,  positive ETCO2 and breath sounds checked- equal and bilateral Secured at: 21 cm Tube secured with: Tape Dental Injury: Teeth and Oropharynx as per pre-operative assessment

## 2019-05-05 NOTE — Brief Op Note (Signed)
05/03/2019 - 05/05/2019  1:36 PM  PATIENT:  Nancy Clark  58 y.o. female  PRE-OPERATIVE DIAGNOSIS:  Jaundice; RUQ pain; Metastatic colon cancer  POST-OPERATIVE DIAGNOSIS:  sphincterotomy, pancreatic and metal biliary stents placed  PROCEDURE:  Procedure(s): ENDOSCOPIC RETROGRADE CHOLANGIOPANCREATOGRAPHY (ERCP) (N/A) SPHINCTEROTOMY REMOVAL OF STONES PANCREATIC STENT PLACEMENT BILIARY STENT PLACEMENT  SURGEON:  Surgeon(s) and Role:    Ronnette Juniper, MD - Primary  PHYSICIAN ASSISTANT:   ASSISTANTS: Kingsley Plan, RN, William Dalton, Tech   ANESTHESIA:   MAC  EBL:  Minimal  BLOOD ADMINISTERED:none  DRAINS: none   LOCAL MEDICATIONS USED:  NONE  SPECIMEN:  No Specimen  DISPOSITION OF SPECIMEN:  N/A  COUNTS:  YES  TOURNIQUET:  * No tourniquets in log *  DICTATION: .Dragon Dictation  PLAN OF CARE: Admit to inpatient   PATIENT DISPOSITION:  PACU - hemodynamically stable.   Delay start of Pharmacological VTE agent (>24hrs) due to surgical blood loss or risk of bleeding: no

## 2019-05-05 NOTE — Progress Notes (Signed)
New order placed. Awaiting on the patch from the pharmacy.

## 2019-05-05 NOTE — Op Note (Addendum)
[facility] Patient Name: Nancy Clark Procedure Date : 05/05/2019 MRN: [id number] Attending MD: Ronnette Juniper , MD Date of Birth: Jan 02, 1961 CSN: [id number] Age: 58 Admit Type: Inpatient Procedure:                ERCP Indications:              Biliary dilation on Computed Tomogram Scan,                            Jaundice, Elevated liver enzymes, history of                            metastatic colon cancer, GB dilation, possible mid                            to distal CBD stricture from extrinsic compression Providers:                Ronnette Juniper, MD, Carlyn Reichert, RN, William Dalton,                            Technician Referring MD:             [facility] Medicines:                Monitored Anesthesia Care Complications:            No immediate complications. Estimated blood loss:                            Minimal. Estimated Blood Loss:     Estimated blood loss was minimal. Procedure:                Pre-Anesthesia Assessment:                           - Prior to the procedure, a History and Physical                            was performed, and patient medications and                            allergies were reviewed. The patient's tolerance of                            previous anesthesia was also reviewed. The risks                            and benefits of the procedure and the sedation                            options and risks were discussed with the patient.                            All questions were answered, and informed consent                            was  obtained. Prior Anticoagulants: The patient has                            taken no previous anticoagulant or antiplatelet                            agents. ASA Grade Assessment: III - A patient with                            severe systemic disease. After reviewing the risks                            and benefits, the patient was deemed in                            satisfactory  condition to undergo the procedure.                           After obtaining informed consent, the scope was                            passed under direct vision. Throughout the                            procedure, the patient's blood pressure, pulse, and                            oxygen saturations were monitored continuously.The                            ERCP was technically difficult and complex due to                            challenging cannulation because of abnormal                            anatomy. The patient tolerated the procedure well.                            The [id number] Olympus duodenoscope was                            introduced through the mouth, and used to inject                            contrast into and used to inject contrast into the                            bile duct. Findings:      The scout film was normal. The esophagus was successfully intubated       under direct vision. The scope was advanced to a normal major papilla in       the descending duodenum without detailed examination of the pharynx,       larynx  and associated structures, and upper GI tract. The upper GI tract       was grossly normal.      Small amount of residual food was noted in the gastric cavity.      After inadvertent canulation of the pancreatic duct, a plastic 4 Fr 5 cm       stent was left in the pancreatic duct.      After multiple attempts, the bile duct was deeply cannulated with the       tapered sphincterotome. Contrast was injected. I personally interpreted       the bile duct images. There was brisk flow of contrast through the       ducts. Image quality was excellent. Contrast extended to the main bile       duct. The lower third of the main bile duct and middle third of the main       bile duct contained localized stricture. The upper third of the main       bile duct was moderately dilated. A straight Roadrunner wire was passed       into the biliary  tree. Biliary sphincterotomy was made with a braided       tapered sphincterotome using ERBE electrocautery. There was no       post-sphincterotomy bleeding. The biliary tree was swept with a 9 amm       and 12 mm balloon starting at the bifurcation. Minimal amount of sludge       was swept from the duct. The balloon could not be pulled through the       distal CBD in an inflated position.      Post balloon sweep, a large amount of dark bile was noted.      One 10 mm by 6 cm uncovered metal stent was placed 5.5 cm into the       common bile duct. Bile flowed through the stent. The stent was in good       position.      As the pancreatic stent appeared to have almost fallen off after       canulation of CBD and metal stent placement, it was removed with a snare. Impression:               - Localized biliary stricture was found in the                            lower third of the main bile duct and middle third                            of the main bile duct.                           - The upper third of the main bile duct was                            moderately dilated.                           - A biliary sphincterotomy was performed.                           - The biliary  tree was swept and sludge was found.                           - One uncovered metal stent was placed into the                            common bile duct.                           - Pancreatic stent was removed at the end of the                            procedure. Pancreatic duct was never injected and                            patient was given rectal indomethacin at the end of                            the procedure. Moderate Sedation:      Patient did not receive moderate sedation for this procedure, but       instead received monitored anesthesia care. Recommendation:           - Clear liquid diet.                           - Check liver enzymes (AST, ALT, alkaline                             phosphatase, bilirubin) tomorrow. Procedure Code(s):        --- Professional ---                           [phone], Endoscopic retrograde                            cholangiopancreatography (ERCP); with placement of                            endoscopic stent into biliary or pancreatic duct,                            including pre- and post-dilation and guide wire                            passage, when performed, including sphincterotomy,                            when performed, each stent                           [id number], Endoscopic retrograde                            cholangiopancreatography (ERCP); with removal of  calculi/debris from biliary/pancreatic duct(s) Diagnosis Code(s):        --- Professional ---                           K83.1, Obstruction of bile duct                           K83.8, Other specified diseases of biliary tract                           R17, Unspecified jaundice                           R74.8, Abnormal levels of other serum enzymes CPT copyright 2019 American Medical Association. All rights reserved. The codes documented in this report are preliminary and upon coder review may  be revised to meet current compliance requirements. Ronnette Juniper, MD 05/05/2019 2:02:33 PM This report has been signed electronically. Number of Addenda: 0

## 2019-05-05 NOTE — Interval H&P Note (Signed)
History and Physical Interval Note: 57/female with metastatic colon cancer,malignant compression of bile duct with abnormal LFTs for an ERCP and uncovered metal stent placement.  05/05/2019 11:31 AM  Nancy Clark  has presented today for ERCP, with the diagnosis of Jaundice; RUQ pain; Metastatic colon cancer.  The various methods of treatment have been discussed with the patient and family. After consideration of risks, benefits and other options for treatment, the patient has consented to  Procedure(s): ENDOSCOPIC RETROGRADE CHOLANGIOPANCREATOGRAPHY (ERCP) (N/A) as a surgical intervention.  The patient's history has been reviewed, patient examined, no change in status, stable for surgery.  I have reviewed the patient's chart and labs.  Questions were answered to the patient's satisfaction.     Ronnette Juniper

## 2019-05-05 NOTE — Progress Notes (Addendum)
PROGRESS NOTE    Nancy Clark  MRN:[id number]  DOB: 02/02/1961  PCP: Patient, No Pcp Per Admit date:05/03/2019  57 y.o.femalewith medical history ofcolon cancers/ppartial colectomy in 07/2015 on chemotherapy presented as a transfer from [facility] for evaluation of right upper quadrant abdominal pain progressively worsening over few weeks. Associated symptoms include nausea, diarrhea, and weight loss of approximately 6 pounds over the last 1 to 2 weeks.Her oncologist had scheduled her for a surveillance CT scan of her abdomen andpelvis. After the scan she was told that her gallbladder was enlarged and sent to the emergency department for further evaluation. Afebrile,Review of records noted CT imaging revealed new biliary obstruction at the mid to distal common bile duct concerning for compression secondary to malignancy, gallbladder distention, possible colitis, and 3 new hepatic densities concerning for metastases. Labs significant for hempotassium 2.4, alkaline phosphatase 532, albumin 3.1, AST 116, ALT 95, total bilirubin 7.4, anddirect bilirubin 5. Hospital course: Patient admitted to [facility] for above concerning CT findings and started on empiric abx. [facility] agreed to see the patient in consultation.ERCP with possible stent placement by Dr. Karki on 12/14.Patient is followed by Dr. Brahmandayand reports that her last chemotherapy was on 12/7. Subjective:  Patient seen this evening after she came back from ERCP.  Per bedside nurse, she has been drowsy since she came back from the procedure but appears to be more awake now and talking.  Saturating well on room air but complaining of headache.  Blood pressure has been elevated with systolic 170s to 190s in spite of hydralazine IV as needed x2 doses.  She has clear liquid diet at bedside but states is not hungry-does not want to eat.  Mother at her bedside.  Objective: Vitals:   05/04/19 2138 05/05/19 0019  05/05/19 0650 05/05/19 0843  BP: (!) 195/80 (!) 188/74 (!) 170/90 (!) 158/56  Pulse: 65 69 71   Resp:   18 18  Temp:   99 F (37.2 C)   TempSrc:   Oral   SpO2:  100% 97% 100%  Weight:   39 kg     Intake/Output Summary (Last 24 hours) at 05/05/2019 0938 Last data filed at 05/05/2019 0700 Gross per 24 hour  Intake 1942.67 ml  Output 700 ml  Net 1242.67 ml   Filed Weights   05/04/19 0348 05/05/19 0650  Weight: 40.5 kg 39 kg    Physical Examination:  General exam: Appears calm and comfortable with no respiratory distress Respiratory system: Clear to auscultation. Respiratory effort normal. Cardiovascular system: S1 & S2 heard, RRR. No JVD, murmurs, rubs, gallops or clicks. No pedal edema. Gastrointestinal system: Abdomen is nondistended, soft and nontender. Normal bowel sounds heard. Central nervous system: Alert and oriented. No new focal neurological deficits. Extremities: No contractures, edema or joint deformities.  Skin: No rashes, lesions or ulcers Psychiatry: Judgement and insight appear normal. Mood & affect appropriate.   Data Reviewed: I have personally reviewed following labs and imaging studies  CBC: Recent Labs  Lab 05/02/19 1358 05/03/19 1017 05/03/19 1144 05/03/19 1917 05/04/19 0426 05/05/19 0548  WBC 6.5 4.8  --   --  8.4 6.6  NEUTROABS  --  3.9  --   --   --  4.8  HGB 11.1* 5.6* 9.7* 10.0* 10.6* 10.1*  HCT 33.0* 18.3* 29.9* 30.9* 32.8* 30.4*  MCV 85.3 94.8  --   --  90.4 86.9  PLT 122* 75*  --   --  137* 133*     Basic Metabolic Panel: Recent Labs  Lab 04/28/19 1005 05/02/19 1358 05/03/19 1017 05/04/19 0426 05/05/19 0548  NA 139 140 145 141 142  K 2.8* 2.4* 2.6* 3.7 4.1  CL 103 102 104 106 109  CO2 30 28 27 26 24  GLUCOSE 93 94 64* 92 93  BUN 9 7 11 8 <5*  CREATININE 0.58 0.59 0.94 0.93 0.82  CALCIUM 8.0* 8.7* 8.4* 8.5* 8.7*  MG  --   --  1.5*  --   --    GFR: Estimated Creatinine Clearance: 47.7 mL/min (by C-G formula based on SCr of  0.82 mg/dL). Liver Function Tests: Recent Labs  Lab 05/02/19 1358 05/03/19 1017 05/04/19 0426 05/05/19 0548  AST 116* 83* 112* 141*  ALT 95* 77* 81* 96*  ALKPHOS 532* 455* 465* 472*  BILITOT 7.4* 8.5* 7.6* 8.0*  PROT 6.4* 5.3* 5.8* 5.2*  ALBUMIN 3.1* 2.5* 2.6* 2.3*   Recent Labs  Lab 05/02/19 1358  LIPASE 14   No results for input(s): AMMONIA in the last 168 hours. Coagulation Profile: No results for input(s): INR, PROTIME in the last 168 hours. Cardiac Enzymes: No results for input(s): CKTOTAL, CKMB, CKMBINDEX, TROPONINI in the last 168 hours. BNP (last 3 results) No results for input(s): PROBNP in the last 8760 hours. HbA1C: No results for input(s): HGBA1C in the last 72 hours. CBG: No results for input(s): GLUCAP in the last 168 hours. Lipid Profile: No results for input(s): CHOL, HDL, LDLCALC, TRIG, CHOLHDL, LDLDIRECT in the last 72 hours. Thyroid Function Tests: No results for input(s): TSH, T4TOTAL, FREET4, T3FREE, THYROIDAB in the last 72 hours. Anemia Panel: No results for input(s): VITAMINB12, FOLATE, FERRITIN, TIBC, IRON, RETICCTPCT in the last 72 hours. Sepsis Labs: No results for input(s): PROCALCITON, LATICACIDVEN in the last 168 hours.  Recent Results (from the past 240 hour(s))  SARS CORONAVIRUS 2 (TAT 6-24 HRS) Nasopharyngeal Nasopharyngeal Swab     Status: None   Collection Time: 05/02/19  8:15 PM   Specimen: Nasopharyngeal Swab  Result Value Ref Range Status   SARS Coronavirus 2 NEGATIVE NEGATIVE Final    Comment: (NOTE) SARS-CoV-2 target nucleic acids are NOT DETECTED. The SARS-CoV-2 RNA is generally detectable in upper and lower respiratory specimens during the acute phase of infection. Negative results do not preclude SARS-CoV-2 infection, do not rule out co-infections with other pathogens, and should not be used as the sole basis for treatment or other patient management decisions. Negative results must be combined with clinical  observations, patient history, and epidemiological information. The expected result is Negative. Fact Sheet for Patients: https://www.fda.gov/media/138098/download Fact Sheet for Healthcare Providers: https://www.fda.gov/media/138095/download This test is not yet approved or cleared by the [place] FDA and  has been authorized for detection and/or diagnosis of SARS-CoV-2 by FDA under an Emergency Use Authorization (EUA). This EUA will remain  in effect (meaning this test can be used) for the duration of the COVID-19 declaration under Section 56 4(b)(1) of the Act, 21 U.S.C. section 360bbb-3(b)(1), unless the authorization is terminated or revoked sooner. Performed at [facility], [address] [place], [place] [address]   Surgical PCR screen     Status: None   Collection Time: 05/04/19 12:47 PM   Specimen: Nasal Mucosa; Nasal Swab  Result Value Ref Range Status   MRSA, PCR NEGATIVE NEGATIVE Final   Staphylococcus aureus NEGATIVE NEGATIVE Final    Comment: (NOTE) The Xpert SA Assay (FDA approved for NASAL specimens in patients 22 years of age and   older), is one component of a comprehensive surveillance program. It is not intended to diagnose infection nor to guide or monitor treatment. Performed at [facility], [address] [place], [place] [address]       Radiology Studies: No results found.      Scheduled Meds: . sodium chloride   Intravenous Once  . amLODipine  5 mg Oral Daily  . Chlorhexidine Gluconate Cloth  6 each Topical Daily  . dronabinol  5 mg Oral BID AC  . enoxaparin (LOVENOX) injection  30 mg Subcutaneous Q24H  . feeding supplement (ENSURE ENLIVE)  237 mL Oral Q2000  . fentaNYL  1 patch Transdermal Q72H  . gabapentin  300 mg Oral Daily  . gabapentin  600 mg Oral QHS  . potassium chloride SA  20 mEq Oral QID  . sodium chloride flush  10-40 mL Intracatheter Q12H  . sodium chloride flush  3 mL Intravenous Q12H   Continuous  Infusions: . sodium chloride 75 mL/hr at 05/03/19 1015  . sodium chloride    . piperacillin-tazobactam 3.375 g (05/05/19 0902)    Assessment & Plan:   Acute cholangitis/biliary duct obstruction due to extrinsic compression: -Patient was seen by GI in consultation and patient .N.p.o. after midnight for ERCP.  Continue empiric antibiotics/IV fluids. -Monitor LFTs alk phos and total bili trends. ERCP done today revealed : -Localized biliary stricture was found in the lower third of the main bile duct and middle third of the main bile duct. - The upper third of the main bile duct was moderately dilated. - A biliary sphincterotomy was performed. - The biliary tree was swept and sludge was found. - One uncovered metal stent was placed into the common bile duct. - Pancreatic stent was removed at the end of the procedure. Pancreatic duct was never injected and patient was given rectal indomethacin at the end of the procedure  History of colon cancer with metastatic liver disease -Continue pain management -Oncology aware of admission  Possible colitis noted to be present on admission -Zosyn was started empirically will continue for now given patient's intra-abdominal pathology as noted above  Essential hypertension-uncontrolled:-Continue amlodipine and hydralazine as needed.  Will add clonidine if persistently hypertensive on repeat check.  Address headache with IV Toradol x1.  Advised nurse to discontinue if blood pressure drops once pain relieved.  Acute on chronic anemia: -Patient hemoglobin remained stable at 9.7>10>10.6 -Initially noted to be 5 -Occult fecal pending  Hypokalemia acute -Patient received magnesium, -Potassium 3.7 today   DVT prophylaxis: Lovenox Code Status: Full code Family / Patient Communication: Discussed with patient and mother at bedside Disposition Plan: Home likely when medically cleared     LOS: 2 days    Time spent: 35  minutes    Neelima Kamineni, MD [facility] Pager [phone]  If 7PM-7AM, please contact night-coverage [email or web address] Password [email or web address] 05/05/2019, 9:38 AM  

## 2019-05-05 NOTE — Telephone Encounter (Signed)
On 12/11; I spoke to pt's mother re: findings noted on the CT scan-gallbladder distention from progressive disease-needing further evaluation/admission to hospital.

## 2019-05-05 NOTE — Progress Notes (Signed)
Patient given some morning medication, shortly after she got nauseated and vomited. Offered nausea IV medications. She took a nap and stated that she feels better.

## 2019-05-05 NOTE — Transfer of Care (Signed)
Immediate Anesthesia Transfer of Care Note  Patient: Nancy Clark  Procedure(s) Performed: ENDOSCOPIC RETROGRADE CHOLANGIOPANCREATOGRAPHY (ERCP) (N/A ) SPHINCTEROTOMY REMOVAL OF STONES PANCREATIC STENT PLACEMENT BILIARY STENT PLACEMENT  Patient Location: Endoscopy Unit  Anesthesia Type:General  Level of Consciousness: sedated  Airway & Oxygen Therapy: Patient Spontanous Breathing and Patient connected to face mask oxygen  Post-op Assessment: Post -op Vital signs reviewed and unstable, Anesthesiologist notified  Post vital signs: unstable  Last Vitals:  Vitals Value Taken Time  BP    Temp    Pulse    Resp    SpO2      Last Pain:  Vitals:   05/05/19 1041  TempSrc: Core (Comment)  PainSc: 0-No pain         Complications: No apparent anesthesia complications

## 2019-05-06 ENCOUNTER — Telehealth: Payer: Self-pay | Admitting: Internal Medicine

## 2019-05-06 DIAGNOSIS — C772 Secondary and unspecified malignant neoplasm of intra-abdominal lymph nodes: Secondary | ICD-10-CM

## 2019-05-06 DIAGNOSIS — C182 Malignant neoplasm of ascending colon: Secondary | ICD-10-CM

## 2019-05-06 LAB — COMPREHENSIVE METABOLIC PANEL
ALT: 85 U/L — ABNORMAL HIGH (ref 0–44)
AST: 76 U/L — ABNORMAL HIGH (ref 15–41)
Albumin: 2.3 g/dL — ABNORMAL LOW (ref 3.5–5.0)
Alkaline Phosphatase: 391 U/L — ABNORMAL HIGH (ref 38–126)
Anion gap: 8 (ref 5–15)
BUN: 8 mg/dL (ref 6–20)
CO2: 26 mmol/L (ref 22–32)
Calcium: 8.6 mg/dL — ABNORMAL LOW (ref 8.9–10.3)
Chloride: 107 mmol/L (ref 98–111)
Creatinine, Ser: 0.96 mg/dL (ref 0.44–1.00)
GFR calc Af Amer: >60 mL/min (ref >60)
GFR calc non Af Amer: >60 mL/min (ref >60)
Glucose, Bld: 110 mg/dL — ABNORMAL HIGH (ref 70–99)
Potassium: 4.5 mmol/L (ref 3.5–5.1)
Sodium: 141 mmol/L (ref 135–145)
Total Bilirubin: 5.1 mg/dL — ABNORMAL HIGH (ref 0.3–1.2)
Total Protein: 5.1 g/dL — ABNORMAL LOW (ref 6.5–8.1)

## 2019-05-06 MED ORDER — HEPARIN SOD (PORK) LOCK FLUSH 100 UNIT/ML IV SOLN
500.0000 [IU] | INTRAVENOUS | Status: AC | PRN
  Administered 2019-05-06: 500 [IU]
  Filled 2019-05-06: qty 5

## 2019-05-06 MED ORDER — FENTANYL CITRATE (PF) 100 MCG/2ML IJ SOLN
INTRAMUSCULAR | Status: AC
  Filled 2019-05-06: qty 2

## 2019-05-06 MED ORDER — CLONIDINE 0.1 MG/24HR TD PTWK: 0.1000 mg | MEDICATED_PATCH | TRANSDERMAL | 12 refills | Status: AC

## 2019-05-06 MED ORDER — AMLODIPINE BESYLATE 5 MG PO TABS: 10.0000 mg | ORAL_TABLET | Freq: Every day | ORAL | 6 refills | Status: AC

## 2019-05-06 MED ORDER — AMOXICILLIN-POT CLAVULANATE 875-125 MG PO TABS: 1.0000 | ORAL_TABLET | Freq: Two times a day (BID) | ORAL | 0 refills | Status: AC

## 2019-05-06 NOTE — Discharge Summary (Addendum)
Physician Discharge Summary  Nancy Clark [id number] DOB: Jun 09, 1960 DOA: 05/03/2019  PCP: Patient, No Pcp Per  Admit date: 05/03/2019 Discharge date: 05/06/2019 Consultations: Gastroenterology Dr Nancy Clark, [place]  Admitted From: home Disposition: home  Discharge Diagnoses:  Principal Problem:   Biliary obstruction   Active Problems:   Hypertensive urgency   Hypokalemia   Metastases to the liver [facility])   History of colon cancer   Hyperbilirubinemia   Elevated liver enzymes   Thrombocytopenia (HCC)   Normocytic anemia   Essential hypertension   Hospital Course Summary: 58 y.o.femalewith medical history ofcolon cancers/ppartial colectomy in 07/2015 on [facility] progressively worsening over few weeks.Associated symptoms include nausea, diarrhea, and weight loss of approximately 6 pounds over the last 1 to 2 weeks.Her oncologist had scheduled her for a surveillance CT scan of her abdomen andpelvis. After the scan she was told that her gallbladder was enlarged and sent to the emergency department for further evaluation.Afebrile,Review of records noted CT imaging revealed new biliary obstruction at the mid to distal common bile duct concerning for compression secondary to malignancy, gallbladder distention, possible colitis, and 3 new hepatic densities concerning for metastases. Labs significant for hempotassium 2.4, alkaline phosphatase 532, albumin 3.1, AST 116, ALT 95, total bilirubin 7.4, anddirect bilirubin 5. Hospital course: [facility] for[facility] concerning CT findings and started on empiric abx.[facility] and agreed to see the patient in consultation.ERCP with possible stent placement by Dr. Therisa Clark planned for 12/14.Patient is followed by Dr. Thana Clark reports that her last chemotherapy was on 12/7.  Hospital course complicated by uncontrolled  blood pressure with systolic pressures as high as 190 to 220 during the initial hospital stay.  Acute cholangitis/biliary duct obstruction due to extrinsic compression: -Patient was seen by GI in consultationand patient .patient started on empiric antibiotics/IV fluids upon admission.Marland Kitchen ERCP done on 12/14 with findings and interventions as below : -Localized biliary stricture was found in the lower third of the main bile duct and middle third of the main bile duct. - The upper third of the main bile duct was moderately dilated. - A biliary sphincterotomy was performed. - The biliary tree was swept and sludge was found. - One uncovered metal stent was placed into the common bile duct. - Pancreatic stent was removed at the end of the procedure. Pancreatic duct was never injected and patient was given rectal indomethacin at the end of the procedure GI stated that this CBD stent will remain in and no plans for removal.  They are advised to follow-up with primary oncologist as scheduled.  Her LFTs after the procedure have trended down and her diet has been advanced as tolerated.  Her nausea is much improved today with no vomiting.  She is anxious to go home.  She may resume home medication regimen for antiemetics/pain medications.  Will transition IV Zosyn to Augmentin upon discharge to complete a 7-day course.  Possible colitis noted to be present on admission CT scan -Zosyn was started empirically which she has received for 3 days, will transition to oral Augmentin for 3-4 more days.    Hypertensive Urgency--patient systolic blood pressures were significantly elevated on presentation up to systolic [id number], peaked to [id number] during the hospital stay.  Likely related to pain, nausea as well as inability to tolerate p.o. meds.  She received multiple doses of IV hydralazine as needed yesterday in addition to home dose of Norvasc 5 mg with minimal improvement.  Clonidine patch was initiated.  SBP improved  today to systolic [id number].  Patient denies any pain or nausea today.  Will discharge with prescriptions for clonidine patch and home dose of Norvasc.  However discussed with both patient and mother in detail regarding blood pressure monitoring at home and discontinuing clonidine patch if she has episodes of hypotension or lightheadedness.  She can also increase home dose of Norvasc to 10 mg if has persistent elevated blood pressure greater than systolic [id number] as explained.    History of colon cancer with metastatic liver disease -Continue pain management including fentanyl patch/hydrocodone as needed -Oncology aware of admission, patient to reschedule follow-up appointment which was supposed to be yesterday.  Acute on chronic anemia: -Patient had labs done on 12/11 with hemoglobin level resulting at 11.1.  On initial presentation she had CBC showing pancytopenia (WBC 4.8, hemoglobin 5.6 and platelets 75)-likely erroneous with dilutional effect.  Repeat labs within an hour showed hemoglobin at 9.7 and subsequently remained stable around10.  WBC and platelets were also improved on repeat labs as shown below.  Follow-up hematology/oncology as outpatient.  Hypokalemia acute -Patient received magnesium and potassium replacement.  Likely related to GI losses.  Now normalized.    Discharge Exam:  Vitals:   05/06/19 1331 05/06/19 1519  BP: (!) 175/76 (!) 151/71  Pulse: 63 74  Resp:  16  Temp:    SpO2:  100%   Vitals:   05/06/19 0732 05/06/19 0923 05/06/19 1331 05/06/19 1519  BP: (!) 143/79 (!) 141/65 (!) 175/76 (!) 151/71  Pulse: 78 79 63 74  Resp: 16 16  16   Temp: 98.3 F (36.8 C)     TempSrc: Oral     SpO2: 98% 99%  100%  Weight:      Height:        General: Pt is alert, awake, not in acute distress, appears cachectic Cardiovascular: RRR, S1/S2 +, no rubs, no gallops Respiratory: CTA bilaterally, no wheezing, no rhonchi Abdominal: Soft, NT, ND, bowel sounds + Extremities: no edema, no  cyanosis  Discharge Condition:Stable CODE STATUS: Full code Diet recommendation: Low-salt diet Recommendations for Outpatient Follow-up:  1. Follow up with PCP: 3 to 5 days 2. Follow up with consultants: Primary oncologist in 1 week 3. Please obtain follow up labs including: CBC/BMP   Discharge Instructions:  Discharge Instructions    Call MD for:   Complete by: As directed    TOO HIGH OR TOO LOW BP   Call MD for:  difficulty breathing, headache or visual disturbances   Complete by: As directed    Call MD for:  extreme fatigue   Complete by: As directed    Call MD for:  persistant dizziness or light-headedness   Complete by: As directed    Call MD for:  persistant nausea and vomiting   Complete by: As directed    Call MD for:  severe uncontrolled pain   Complete by: As directed    Call MD for:  temperature >100.4   Complete by: As directed    Diet - low sodium heart healthy   Complete by: As directed    Increase activity slowly   Complete by: As directed      Allergies as of 05/06/2019   No Known Allergies     Medication List    TAKE these medications   acetaminophen 325 MG tablet Commonly known as: TYLENOL Take 2 tablets (650 mg total) by mouth every 6 (six) hours as needed for mild  pain (or Fever >/= 101).   amLODipine 5 MG tablet Commonly known as: NORVASC Take 1-2 tablets (5 mg with additional dose if SBP>170) by mouth daily. What changed: how much to take   amoxicillin-clavulanate 875-125 MG tablet Commonly known as: Augmentin Take 1 tablet by mouth 2 (two) times daily for 4 days.   CALCIUM 600/VITAMIN D PO Take 1 tablet by mouth every evening.   cloNIDine 0.1 mg/24hr patch Commonly known as: CATAPRES - Dosed in mg/24 hr Place 1 patch (0.1 mg total) onto the skin every Monday at 6 PM. Start taking on: May 12, 2019   diphenoxylate-atropine 2.5-0.025 MG tablet Commonly known as: LOMOTIL Take 1 tablet by mouth 4 (four) times daily as needed for  diarrhea or loose stools. Take it along with immodium   dronabinol 5 MG capsule Commonly known as: MARINOL Take 1 capsule (5 mg total) by mouth 2 (two) times daily before lunch and supper.   DULoxetine 20 MG capsule Commonly known as: CYMBALTA Take 2 capsules (40 mg total) by mouth daily. What changed:   how much to take  when to take this   feeding supplement Liqd Take 1 Container by mouth daily.   fentaNYL 50 MCG/HR Commonly known as: [place] 1 patch onto the skin every 3 (three) days.   ferrous sulfate 325 (65 FE) MG tablet Commonly known as: FerrouSul Take 1 tablet (325 mg total) by mouth daily with breakfast.   gabapentin 300 MG capsule Commonly known as: NEURONTIN Take 1-2 capsules (300-600 mg total) by mouth See admin instructions. Take 1 capsule (300 mg) by mouth daily in the morning & take 2 capsules (600 mg) by mouth at night.   HYDROcodone-acetaminophen 5-325 MG tablet Commonly known as: NORCO/VICODIN Take 1 tablet by mouth every 8 (eight) hours as needed (pain.).   lidocaine-prilocaine cream Commonly known as: EMLA Apply 1 application topically as needed. Apply generously over the Mediport 45 minutes prior to chemotherapy. What changed:   reasons to take this  additional instructions   loperamide 2 MG capsule Commonly known as: IMODIUM Take 2 mg by mouth 4 (four) times daily as needed for diarrhea or loose stools.   multivitamin with minerals Tabs tablet Take 1 tablet by mouth daily.   ondansetron 8 MG tablet Commonly known as: ZOFRAN One pill every 8 hours as needed for nausea/vomitting. What changed:   how much to take  how to take this  when to take this  reasons to take this  additional instructions   potassium chloride SA 20 MEQ tablet Commonly known as: KLOR-CON Take 1 tablet (20 mEq total) by mouth 2 (two) times daily. What changed: when to take this   prochlorperazine 10 MG tablet Commonly known as: COMPAZINE Take 1  tablet (10 mg total) by mouth every 6 (six) hours as needed for nausea or vomiting.       No Known Allergies    The results of significant diagnostics from this hospitalization (including imaging, microbiology, ancillary and laboratory) are listed below for reference.    Labs: BNP (last 3 results) No results for input(s): BNP in the last 8760 hours. Basic Metabolic Panel: Recent Labs  Lab 05/02/19 1358 05/03/19 1017 05/04/19 0426 05/05/19 0548 05/06/19 0526  NA 140 145 141 142 141  K 2.4* 2.6* 3.7 4.1 4.5  CL 102 104 106 109 107  CO2 28 27 26 24 26   GLUCOSE 94 64* 92 93 110*  BUN 7 11 8  <5* 8  CREATININE  0.59 0.94 0.93 0.82 0.96  CALCIUM 8.7* 8.4* 8.5* 8.7* 8.6*  MG  --  1.5*  --   --   --    Liver Function Tests: Recent Labs  Lab 05/02/19 1358 05/03/19 1017 05/04/19 0426 05/05/19 0548 05/06/19 0526  AST 116* 83* 112* 141* 76*  ALT 95* 77* 81* 96* 85*  ALKPHOS 532* 455* 465* 472* 391*  BILITOT 7.4* 8.5* 7.6* 8.0* 5.1*  PROT 6.4* 5.3* 5.8* 5.2* 5.1*  ALBUMIN 3.1* 2.5* 2.6* 2.3* 2.3*   Recent Labs  Lab 05/02/19 1358  LIPASE 14   No results for input(s): AMMONIA in the last 168 hours. CBC: Recent Labs  Lab 05/02/19 1358 05/03/19 1017 05/03/19 1144 05/03/19 1917 05/04/19 0426 05/05/19 0548  WBC 6.5 4.8  --   --  8.4 6.6  NEUTROABS  --  3.9  --   --   --  4.8  HGB 11.1* 5.6* 9.7* 10.0* 10.6* 10.1*  HCT 33.0* 18.3* 29.9* 30.9* 32.8* 30.4*  MCV 85.3 94.8  --   --  90.4 86.9  PLT 122* 75*  --   --  137* 133*   Cardiac Enzymes: No results for input(s): CKTOTAL, CKMB, CKMBINDEX, TROPONINI in the last 168 hours. BNP: Invalid input(s): POCBNP CBG: No results for input(s): GLUCAP in the last 168 hours. D-Dimer No results for input(s): DDIMER in the last 72 hours. Hgb A1c No results for input(s): HGBA1C in the last 72 hours. Lipid Profile No results for input(s): CHOL, HDL, LDLCALC, TRIG, CHOLHDL, LDLDIRECT in the last 72 hours. Thyroid function  studies No results for input(s): TSH, T4TOTAL, T3FREE, THYROIDAB in the last 72 hours.  Invalid input(s): FREET3 Anemia work up No results for input(s): VITAMINB12, FOLATE, FERRITIN, TIBC, IRON, RETICCTPCT in the last 72 hours. Urinalysis    Component Value Date/Time   COLORURINE YELLOW (A) 04/21/2019 1250   APPEARANCEUR CLEAR (A) 04/21/2019 1250   LABSPEC 1.013 04/21/2019 1250   PHURINE 6.0 04/21/2019 1250   GLUCOSEU NEGATIVE 04/21/2019 1250   HGBUR NEGATIVE 04/21/2019 1250   BILIRUBINUR NEGATIVE 04/21/2019 1250   BILIRUBINUR Negative 06/03/2015 0849   KETONESUR NEGATIVE 04/21/2019 1250   PROTEINUR NEGATIVE 04/21/2019 1250   UROBILINOGEN 0.2 06/03/2015 0849   NITRITE NEGATIVE 04/21/2019 1250   LEUKOCYTESUR NEGATIVE 04/21/2019 1250   Sepsis Labs Invalid input(s): PROCALCITONIN,  WBC,  LACTICIDVEN Microbiology Recent Results (from the past 240 hour(s))  SARS CORONAVIRUS 2 (TAT 6-24 HRS) Nasopharyngeal Nasopharyngeal Swab     Status: None   Collection Time: 05/02/19  8:15 PM   Specimen: Nasopharyngeal Swab  Result Value Ref Range Status   SARS Coronavirus 2 NEGATIVE NEGATIVE Final    Comment: (NOTE) SARS-CoV-2 target nucleic acids are NOT DETECTED. The SARS-CoV-2 RNA is generally detectable in upper and lower respiratory specimens during the acute phase of infection. Negative results do not preclude SARS-CoV-2 infection, do not rule out co-infections with other pathogens, and should not be used as the sole basis for treatment or other patient management decisions. Negative results must be combined with clinical observations, patient history, and epidemiological information. The expected result is Negative. Fact Sheet for Patients: SugarRoll.be Fact Sheet for Healthcare Providers: https://www.woods-mathews.com/ This test is not yet approved or cleared by the [place] FDA and  has been authorized for detection and/or diagnosis of  SARS-CoV-2 by FDA under an Emergency Use Authorization (EUA). This EUA will remain  in effect (meaning this test can be used) for the duration of the COVID-19 declaration under  Section 56 4(b)(1) of the Act, 21 U.S.C. section 360bbb-3(b)(1), unless the authorization is terminated or revoked sooner. Performed at [facility], [place] [address], [place], [place] [address]   Surgical PCR screen     Status: None   Collection Time: 05/04/19 12:47 PM   Specimen: Nasal Mucosa; Nasal Swab  Result Value Ref Range Status   MRSA, PCR NEGATIVE NEGATIVE Final   Staphylococcus aureus NEGATIVE NEGATIVE Final    Comment: (NOTE) The Xpert SA Assay (FDA approved for NASAL specimens in patients 63 years of age and older), is one component of a comprehensive surveillance program. It is not intended to diagnose infection nor to guide or monitor treatment. Performed at [facility], [place] [address], [place], [place] [address]     Procedures/Studies: CT ABDOMEN PELVIS W CONTRAST  Result Date: 05/02/2019 CLINICAL DATA:  Colorectal carcinoma with metastasis. Patient status post RIGHT hemicolectomy and liver ablation. EXAM: CT ABDOMEN AND PELVIS WITH CONTRAST TECHNIQUE: Multidetector CT imaging of the abdomen and pelvis was performed using the standard protocol following bolus administration of intravenous contrast. CONTRAST:  14mL OMNIPAQUE IOHEXOL 300 MG/ML  SOLN COMPARISON:  CT 02/24/2019 FINDINGS: Lower chest: Lung bases are clear. Hepatobiliary: New intrahepatic and extrahepatic biliary duct dilatation. Ductal dilatation extends into the LEFT and RIGHT hepatic lobes and is moderate in severity. The duct dilatation appears to occur to the level of the proximal common bile duct. The exact location of the obstructed is difficult to define but appears to originate just before pancreatic head (image 19/2). 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 Additionally there is new dilatation of the gallbladder to 5 cm increased from 2 cm on prior. Moderate volume pericholecystic fluid. Within liver parenchyma there is a new low-density lesion in the caudate lobe measuring 13 mm (image 4/2.) potential new lesion adjacent to to fluid RIGHT hepatic lobe lesion measuring 14 mm (image 13/2). The third additional new lesion along the inferior aspect of the central LEFT hepatic lobe (segment 4B) measuring 13 mm on image 19/2. Pancreas: Pancreas appears grossly normal. No pancreatic duct dilatation. Spleen: Normal spleen Adrenals/urinary tract: Adrenal glands and kidneys are normal. The ureters and bladder normal. Stomach/Bowel: Stomach, duodenum and small bowel grossly normal. Post partial RIGHT hemicolectomy. No evidence of bowel obstruction. There is mild submucosal edema and mucosal enhancement through the sigmoid colon rectum which is new from prior. Vascular/Lymphatic: Abdominal aorta is normal caliber. Constriction of the RIGHT portal vein at the takeoff from the main portal vein as described the hepatic biliary findings section. No periportal or retroperitoneal adenopathy. No pelvic adenopathy. Reproductive: Uterus and necks are normal. Other: No free fluid. Musculoskeletal: No aggressive osseous lesion. IMPRESSION: 1. New biliary obstruction at the level of the mid to distal common bile duct. 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 2. Biliary obstruction involves LEFT and RIGHT hepatic lobe as well as new distension of the gallbladder. Recommend correlation with bilirubin levels as patient may require palliative relief of obstruction. 3. Three new low-density lesions in liver concerning for hepatic metastasis. 4. Submucosal edema and mild mucosal enhancement in the LEFT colon and rectosigmoid colon is nonspecific but could represent colitis. [facility] 05/02/2019  at12:26. Electronically Signed   By: Suzy Bouchard M.D.   On: 05/02/2019 12:26   DG ERCP BILIARY &  PANCREATIC DUCTS  Result Date: 05/05/2019 CLINICAL DATA:  Biliary dilatation. History of metastatic colon cancer. EXAM: ERCP TECHNIQUE: Multiple spot images obtained with the fluoroscopic device and submitted for interpretation post-procedure. FLUOROSCOPY TIME:  Fluoroscopy Time:  2 minutes and 49 seconds Number of Acquired Spot Images: 4 COMPARISON:  Abdominal CT 05/02/2019 FINDINGS: First image demonstrates a wire in the main pancreatic duct. The biliary system was cannulated and opacified with contrast. A metallic stent was placed in the common bile duct. There is narrowing in the distal aspect of the biliary stent. IMPRESSION: Placement of a biliary stent. These images were submitted for radiologic interpretation only. Please see the procedural report for the amount of contrast and the fluoroscopy time utilized. Electronically Signed   By: Markus Daft M.D.   On: 05/05/2019 14:37     Time coordinating discharge: Over 30 minutes  SIGNED:   Guilford Shi, MD  [facility] 05/06/2019, [place] PM Pager : [phone]

## 2019-05-06 NOTE — Anesthesia Postprocedure Evaluation (Signed)
Anesthesia Post Note  Patient: Nancy Clark  Procedure(s) Performed: ENDOSCOPIC RETROGRADE CHOLANGIOPANCREATOGRAPHY (ERCP) (N/A ) SPHINCTEROTOMY REMOVAL OF STONES PANCREATIC STENT PLACEMENT BILIARY STENT PLACEMENT     Patient location during evaluation: PACU Anesthesia Type: General Level of consciousness: awake and alert Pain management: pain level controlled Vital Signs Assessment: post-procedure vital signs reviewed and stable Respiratory status: spontaneous breathing, nonlabored ventilation, respiratory function stable and patient connected to nasal cannula oxygen Cardiovascular status: blood pressure returned to baseline and stable Postop Assessment: no apparent nausea or vomiting Anesthetic complications: no    Last Vitals:  Vitals:   05/06/19 1331 05/06/19 1519  BP: (!) 175/76 (!) 151/71  Pulse: 63 74  Resp:  16  Temp:    SpO2:  100%    Last Pain:  Vitals:   05/06/19 1519  TempSrc:   PainSc: 2    Pain Goal: Patients Stated Pain Goal: 3 (05/06/19 1519)                 [name]

## 2019-05-06 NOTE — Telephone Encounter (Signed)
C- Please have pt see me on 12/23- MD; labs- cbc/cmp/CEA; no chemo.   Also, coordinate visit with Josh in the same day if possible.  Thanks, GB

## 2019-05-06 NOTE — TOC Initial Note (Signed)
Transition of Care [facility]) - Initial/Assessment Note    Patient Details  Name: Nancy Clark MRN: [id number] Date of Birth: [birth year]  Transition of Care [facility]) CM/SW Contact:    Zenon Mayo, RN Phone Number: 05/06/2019, 9:43 AM  Clinical Narrative:                 NCM spoke with patient regarding her PCP, she does not have one, NCM will give her the [facility] information to help assist her in finding a PCP in network with her insurance.  She states she has transportation at [employer] and she has no issues with affording medications.   Expected Discharge Plan: Home/Self Care Barriers to Discharge: No Barriers Identified   Patient Goals and CMS Choice Patient states their goals for this hospitalization and ongoing recovery are:: to stay well   Choice offered to / list presented to : NA  Expected Discharge Plan and Services Expected Discharge Plan: Home/Self Care In-house Referral: NA Discharge Planning Services: CM Consult Post Acute Care Choice: NA Living arrangements for the past 2 months: Single Family Home                 DME Arranged: (NA)         HH Arranged: NA          Prior Living Arrangements/Services Living arrangements for the past 2 months: Single Family Home Lives with:: Spouse Patient language and need for interpreter reviewed:: Yes Do you feel safe going back to the place where you live?: Yes      Need for Family Participation in Patient Care: No (Comment) Care giver support system in place?: No (comment)   Criminal Activity/Legal Involvement Pertinent to Current Situation/Hospitalization: No - Comment as needed  Activities of Daily Living Home Assistive Devices/Equipment: None ADL Screening (condition at time of admission) Patient's cognitive ability adequate to safely complete daily activities?: Yes Is the patient deaf or have difficulty hearing?: No Does the patient have difficulty seeing, even when wearing glasses/contacts?:  No Does the patient have difficulty concentrating, remembering, or making decisions?: No Patient able to express need for assistance with ADLs?: Yes Does the patient have difficulty dressing or bathing?: No Independently performs ADLs?: Yes (appropriate for developmental age) Does the patient have difficulty walking or climbing stairs?: No Weakness of Legs: None Weakness of Arms/Hands: None  Permission Sought/Granted                  Emotional Assessment   Attitude/Demeanor/Rapport: Engaged Affect (typically observed): Appropriate Orientation: : Oriented to Self, Oriented to Place, Oriented to  Time, Oriented to Situation      Admission diagnosis:  Abdominal pain, acute [R10.9] Biliary obstruction [K83.1] Patient Active Problem List   Diagnosis Date Noted  . Biliary obstruction 05/03/2019  . Metastases to the liver ([place]) 05/03/2019  . History of colon cancer 05/03/2019  . Hyperbilirubinemia 05/03/2019  . Elevated liver enzymes 05/03/2019  . Thrombocytopenia ([place]) 05/03/2019  . Normocytic anemia 05/03/2019  . Essential hypertension 05/03/2019  . Liver mass, right lobe 11/06/2018  . Goals of care, counseling/discussion 10/19/2018  . Cancer, metastatic to bone ([place]) 07/31/2016  . Encounter for antineoplastic chemotherapy 12/07/2015  . Malnutrition of moderate degree 10/13/2015  . Gastrointestinal bleeding 10/12/2015  . Pancytopenia due to chemotherapy ([place]) 10/12/2015  . Hypokalemia 10/12/2015  . 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 Preventative health care 06/08/2015   PCP:  Patient, No Pcp Per Pharmacy:  [id number] - [place], [place] - [address] [place] [address] Phone: [phone] Fax: [phone]  [place], [place] [id number] [place] [address] Phone: [phone] Fax: [phone]  [facility]  [place], [place] [address] [place] [address] Phone: [phone] Fax: [phone]     Social Determinants of Health (SDOH) Interventions    Readmission Risk Interventions Readmission Risk Prevention Plan 05/06/2019  Transportation Screening Complete  HRI or [place] Complete  Social Work Consult for [place] Planning/Counseling Complete  Palliative Care Screening Not Applicable  Medication Review Press photographer) Complete  Some recent data might be hidden

## 2019-05-06 NOTE — Progress Notes (Signed)
Subjective: Denies abdominal pain.  Is continued on clear liquid diet.  Denies nausea or vomiting.  Objective: Vital signs in last 24 hours: Temp:  [97.8 F (36.6 C)-98.3 F (36.8 C)] 98.3 F (36.8 C) (12/15 0732) Pulse Rate:  [63-98] 74 (12/15 1519) Resp:  [16-20] 16 (12/15 1519) BP: (141-195)/(65-100) 151/71 (12/15 1519) SpO2:  [98 %-100 %] 100 % (12/15 1519) Weight:  [39.1 kg] 39.1 kg (12/15 0417) Weight change: -0.408 kg Last BM Date: 05/04/19  PE: Cachectic appearing, frail, chronically ill-appearing GENERAL: Prominent icterus, mild pallor ABDOMEN: Soft, nondistended, nontender, normoactive bowel sounds EXTREMITIES: No deformity  Lab Results: Results for orders placed or performed during the hospital encounter of 05/03/19 (from the past 48 hour(s))  CBC with Differential/Platelet     Status: Abnormal   Collection Time: 05/05/19  5:48 AM  Result Value Ref Range   WBC 6.6 4.0 - 10.5 K/uL   RBC 3.50 (L) 3.87 - 5.11 MIL/uL   Hemoglobin 10.1 (L) 12.0 - 15.0 g/dL   HCT 30.4 (L) 36.0 - 46.0 %   MCV 86.9 80.0 - 100.0 fL   MCH 28.9 26.0 - 34.0 pg   MCHC 33.2 30.0 - 36.0 g/dL   RDW 20.9 (H) 11.5 - 15.5 %   Platelets 133 (L) 150 - 400 K/uL    Comment: REPEATED TO VERIFY CONSISTENT WITH PREVIOUS RESULT    nRBC 0.0 0.0 - 0.2 %   Neutrophils Relative % 74 %   Neutro Abs 4.8 1.7 - 7.7 K/uL   Lymphocytes Relative 13 %   Lymphs Abs 0.9 0.7 - 4.0 K/uL   Monocytes Relative 10 %   Monocytes Absolute 0.7 0.1 - 1.0 K/uL   Eosinophils Relative 1 %   Eosinophils Absolute 0.1 0.0 - 0.5 K/uL   Basophils Relative 0 %   Basophils Absolute 0.0 0.0 - 0.1 K/uL   Immature Granulocytes 2 %   Abs Immature Granulocytes 0.11 (H) 0.00 - 0.07 K/uL    Comment: Performed at [facility], [address] [place], [place] [address]  Comprehensive metabolic panel     Status: Abnormal   Collection Time: 05/05/19  5:48 AM  Result Value Ref Range   Sodium 142 135 - 145 mmol/L   Potassium 4.1  3.5 - 5.1 mmol/L   Chloride 109 98 - 111 mmol/L   CO2 24 22 - 32 mmol/L   Glucose, Bld 93 70 - 99 mg/dL   BUN <5 (L) 6 - 20 mg/dL   Creatinine, Ser 0.82 0.44 - 1.00 mg/dL   Calcium 8.7 (L) 8.9 - 10.3 mg/dL   Total Protein 5.2 (L) 6.5 - 8.1 g/dL   Albumin 2.3 (L) 3.5 - 5.0 g/dL   AST 141 (H) 15 - 41 U/L   ALT 96 (H) 0 - 44 U/L   Alkaline Phosphatase 472 (H) 38 - 126 U/L   Total Bilirubin 8.0 (H) 0.3 - 1.2 mg/dL   GFR calc non Af Amer >60 >60 mL/min   GFR calc Af Amer >60 >60 mL/min   Anion gap 9 5 - 15    Comment: Performed at [facility], [place] [address], [place], [place] [address]  Comprehensive metabolic panel     Status: Abnormal   Collection Time: 05/06/19  5:26 AM  Result Value Ref Range   Sodium 141 135 - 145 mmol/L   Potassium 4.5 3.5 - 5.1 mmol/L   Chloride 107 98 - 111 mmol/L   CO2 26 22 -  32 mmol/L   Glucose, Bld 110 (H) 70 - 99 mg/dL   BUN 8 6 - 20 mg/dL   Creatinine, Ser 0.96 0.44 - 1.00 mg/dL   Calcium 8.6 (L) 8.9 - 10.3 mg/dL   Total Protein 5.1 (L) 6.5 - 8.1 g/dL   Albumin 2.3 (L) 3.5 - 5.0 g/dL   AST 76 (H) 15 - 41 U/L   ALT 85 (H) 0 - 44 U/L   Alkaline Phosphatase 391 (H) 38 - 126 U/L   Total Bilirubin 5.1 (H) 0.3 - 1.2 mg/dL   GFR calc non Af Amer >60 >60 mL/min   GFR calc Af Amer >60 >60 mL/min   Anion gap 8 5 - 15    Comment: Performed at [facility] [address] [place], [place] [address]    Studies/Results: DG ERCP BILIARY & PANCREATIC DUCTS  Result Date: 05/05/2019 CLINICAL DATA:  Biliary dilatation. History of metastatic colon cancer. EXAM: ERCP TECHNIQUE: Multiple spot images obtained with the fluoroscopic device and submitted for interpretation post-procedure. FLUOROSCOPY TIME:  Fluoroscopy Time:  2 minutes and 49 seconds Number of Acquired Spot Images: 4 COMPARISON:  Abdominal CT 05/02/2019 FINDINGS: First image demonstrates a wire in the main pancreatic duct. The biliary system was cannulated and opacified with contrast. A  metallic stent was placed in the common bile duct. There is narrowing in the distal aspect of the biliary stent. IMPRESSION: Placement of a biliary stent. These images were submitted for radiologic interpretation only. Please see the procedural report for the amount of contrast and the fluoroscopy time utilized. Electronically Signed   By: Markus Daft M.D.   On: 05/05/2019 14:37    Medications: I have reviewed the patient's current medications.  Assessment: Status post uncovered metal stent placed in CBD, post sphincterotomy Improvement in liver enzymes, T bili/AST/ALT/ALP 5.1/76/85/391 today  Normocytic anemia Metastatic colon cancer, status post partial colectomy in 2017, maintained on chemotherapy Liver lesions  Plan: Okay to start regular diet. Discussed with patient that metal stent has been permanently placed in the CBD and will hopefully help to keep the CBD patent. Okay to DC from GI standpoint, patient to follow-up with her oncologist at [facility]. GI will sign off, please recall if needed.  Ronnette Juniper, MD 05/06/2019, 3:57 PM

## 2019-05-06 NOTE — Progress Notes (Signed)
Pt. Discharged to home via POV. Pts. Mother at bedside. Pt. Alert and stable at the time. No s/s distress or discomfort noted. VSS. Pts. Implanted left chest port deaccessed by IV team. CCMD notified of pts. Discharge. Discharge instructions given by day shift RN.

## 2019-05-06 NOTE — Plan of Care (Signed)
  Problem: Education: Goal: Knowledge of General Education information will improve Description: Including pain rating scale, medication(s)/side effects and non-pharmacologic comfort measures Outcome: Progressing   Problem: Nutrition: Goal: Adequate nutrition will be maintained Outcome: Progressing   

## 2019-05-07 ENCOUNTER — Other Ambulatory Visit: Payer: Self-pay | Admitting: Licensed Clinical Social Worker

## 2019-05-07 ENCOUNTER — Inpatient Hospital Stay: Payer: BC Managed Care – PPO

## 2019-05-07 DIAGNOSIS — C7951 Secondary malignant neoplasm of bone: Secondary | ICD-10-CM

## 2019-05-07 NOTE — Addendum Note (Signed)
Addended by: Sabino Gasser on: 05/07/2019 08:59 AM   Modules accepted: Orders

## 2019-05-07 NOTE — Telephone Encounter (Signed)
Apts have been scheduled

## 2019-05-13 ENCOUNTER — Other Ambulatory Visit: Payer: Self-pay

## 2019-05-13 ENCOUNTER — Encounter: Payer: Self-pay | Admitting: Internal Medicine

## 2019-05-13 NOTE — Progress Notes (Signed)
Patient pre screened for office appointment, no questions or concerns today. Patient reminded of upcoming appointment time and date. 

## 2019-05-14 ENCOUNTER — Inpatient Hospital Stay (HOSPITAL_BASED_OUTPATIENT_CLINIC_OR_DEPARTMENT_OTHER): Payer: BC Managed Care – PPO | Admitting: Hospice and Palliative Medicine

## 2019-05-14 ENCOUNTER — Other Ambulatory Visit: Payer: Self-pay

## 2019-05-14 ENCOUNTER — Inpatient Hospital Stay: Payer: BC Managed Care – PPO

## 2019-05-14 ENCOUNTER — Inpatient Hospital Stay (HOSPITAL_BASED_OUTPATIENT_CLINIC_OR_DEPARTMENT_OTHER): Payer: BC Managed Care – PPO | Admitting: Internal Medicine

## 2019-05-14 VITALS — BP 191/81 | HR 66 | Temp 96.6°F | Wt 83.0 lb

## 2019-05-14 DIAGNOSIS — C182 Malignant neoplasm of ascending colon: Secondary | ICD-10-CM

## 2019-05-14 DIAGNOSIS — C772 Secondary and unspecified malignant neoplasm of intra-abdominal lymph nodes: Secondary | ICD-10-CM

## 2019-05-14 DIAGNOSIS — Z515 Encounter for palliative care: Secondary | ICD-10-CM

## 2019-05-14 DIAGNOSIS — C7951 Secondary malignant neoplasm of bone: Secondary | ICD-10-CM

## 2019-05-14 DIAGNOSIS — E876 Hypokalemia: Secondary | ICD-10-CM | POA: Diagnosis not present

## 2019-05-14 LAB — COMPREHENSIVE METABOLIC PANEL
ALT: 24 U/L (ref 0–44)
AST: 18 U/L (ref 15–41)
Albumin: 2.9 g/dL — ABNORMAL LOW (ref 3.5–5.0)
Alkaline Phosphatase: 259 U/L — ABNORMAL HIGH (ref 38–126)
Anion gap: 9 (ref 5–15)
BUN: 6 mg/dL (ref 6–20)
CO2: 24 mmol/L (ref 22–32)
Calcium: 8.9 mg/dL (ref 8.9–10.3)
Chloride: 107 mmol/L (ref 98–111)
Creatinine, Ser: 0.6 mg/dL (ref 0.44–1.00)
GFR calc Af Amer: >60 mL/min (ref >60)
GFR calc non Af Amer: >60 mL/min (ref >60)
Glucose, Bld: 128 mg/dL — ABNORMAL HIGH (ref 70–99)
Potassium: 3.6 mmol/L (ref 3.5–5.1)
Sodium: 140 mmol/L (ref 135–145)
Total Bilirubin: 2.4 mg/dL — ABNORMAL HIGH (ref 0.3–1.2)
Total Protein: 6.5 g/dL (ref 6.5–8.1)

## 2019-05-14 LAB — CBC WITH DIFFERENTIAL/PLATELET
Abs Immature Granulocytes: 0.03 10*3/uL (ref 0.00–0.07)
Basophils Absolute: 0 10*3/uL (ref 0.0–0.1)
Basophils Relative: 0 %
Eosinophils Absolute: 0.1 10*3/uL (ref 0.0–0.5)
Eosinophils Relative: 1 %
HCT: 33.4 % — ABNORMAL LOW (ref 36.0–46.0)
Hemoglobin: 10.2 g/dL — ABNORMAL LOW (ref 12.0–15.0)
Immature Granulocytes: 1 %
Lymphocytes Relative: 11 %
Lymphs Abs: 0.7 10*3/uL (ref 0.7–4.0)
MCH: 28.1 pg (ref 26.0–34.0)
MCHC: 30.5 g/dL (ref 30.0–36.0)
MCV: 92 fL (ref 80.0–100.0)
Monocytes Absolute: 1 10*3/uL (ref 0.1–1.0)
Monocytes Relative: 16 %
Neutro Abs: 4.4 10*3/uL (ref 1.7–7.7)
Neutrophils Relative %: 71 %
Platelets: 194 10*3/uL (ref 150–400)
RBC: 3.63 MIL/uL — ABNORMAL LOW (ref 3.87–5.11)
RDW: 20.5 % — ABNORMAL HIGH (ref 11.5–15.5)
WBC: 6.2 10*3/uL (ref 4.0–10.5)
nRBC: 0 % (ref 0.0–0.2)

## 2019-05-14 NOTE — Assessment & Plan Note (Addendum)
#   Colon cancer- cecal/ right-sided; STAGE IV; On 5FU+ Avastin q 2 W x4 cycles; December 11th 2020-CT scan-gallbladder distention; worsening disease in the liver-up to 3 lesions new [13 to 14 mm in size].  #Given the progressive disease recommend discontinuation of Avastin/5-FU.  Recommend treatment with Opdivo plus Stivarga.  Discussed the recent data of early stage/phase 1B clinical trial-improved up to 30% response rates with combination therapy.  Discussed that regimen is not FDA approved.  Discussed the mechanism of action-of each drugs/ and potential side effects.   I discussed the mechanism of action; The goal of therapy is palliative; and length of treatments are likely ongoing/based upon the results of the scans. Discussed the potential side effects of immunotherapy including but not limited to diarrhea; skin rash; elevated LFTs/endocrine abnormalities etc.  # recommend starting to 80 mg/day dose. Discussed potential side effects including but not limited to- hand-foot syndrome and diarrhea oral ulcers; elevated blood pressures. Discussed regarding use of Imodium and Lomotil. Blood counts will be checked prior to each treatment cycle.   # Discussed option of clinical trials/tertiary centers; patient reluctant.   #Ongoing diarrhea-2-3 loose stools/ day; continue Lomotil-refilled; continue imodium; stable.    # Abdominal discomfort/epigastric/ back pain -Sec to malignancy; fentany/ hydocodone TID.  Stable.  #Hypertension- Currently on Norvasc 5 mg blood [id number] systolic.  Recommend compliance of blood pressure medications.  Monitor closely on/when started on Stivarga.  # hypokalemia- 3.6; one pill kdur a day. Continue kdur 20 meq/day.    # I offered to call the patient's family to give an update on clinical status; patient declines.   # DISPOSITION: # follow up in 2 weeks; labs;MD- - cbc/cmp/CEA;TSH- Opdivo[new]Dr.B    # 40 minutes face-to-face with the patient discussing the above  plan of care; more than 50% of time spent on prognosis/ natural history; counseling and coordination.

## 2019-05-14 NOTE — Progress Notes (Signed)
[place]  [phone] [phone]   Name: Nancy Clark Date: 05/14/2019 MRN: [id number]  DOB: Sep 16, 1960  Patient Care Team: Patient, No Pcp Per as PCP - General (General Practice)    REASON FOR CONSULTATION: Palliative Care consult requested for this 58 y.o. female with multiple medical problems including stage IV colorectal cancer status post right hemicolectomy and XRT.  She is also status post liver ablation to area of hepatic metastasis.  Patient is currently being treated on systemic therapy with 5-FU plus bevacizumab .  She was referred to palliative care to help discuss goals and manage ongoing symptoms.   SOCIAL HISTORY:     reports that she has quit smoking. She has a 0.50 pack-year smoking history. She has never used smokeless tobacco. She reports that she does not drink alcohol or use drugs.  Patient is married and lives at home with her husband and son.  She has another son who lives in [place].  Patient previously worked at [employer] in environmental services.  ADVANCE DIRECTIVES:  Does not have  CODE STATUS:   PAST MEDICAL HISTORY: Past Medical History:  Diagnosis Date  . Chicken pox   . Colon cancer ([place])    Partial colectomy 07/2015 + chemo tx's  . Hypertension   . Hypokalemia   . Menopause    > 5 yrs  . Peripheral neuropathy due to chemotherapy ([place])    bi lat feet    PAST SURGICAL HISTORY:  Past Surgical History:  Procedure Laterality Date  . BILIARY STENT PLACEMENT  05/05/2019   Procedure: BILIARY STENT PLACEMENT;  Surgeon: Ronnette Juniper, MD;  Location: [facility];  Service: Gastroenterology;;  . BREAST BIOPSY Right    2007? pt unsure done by [facility]  . COLONOSCOPY    . COLONOSCOPY WITH PROPOFOL N/A 10/14/2015   Procedure: COLONOSCOPY WITH PROPOFOL;  Surgeon: Hulen Luster, MD;  Location[facility];  Service: Endoscopy;  Laterality: N/A;  . COLOSTOMY  REVISION Right 08/09/2015   Procedure: COLON RESECTION RIGHT;  Surgeon: Florene Glen, MD;  Location: [facility] ORS;  Service: General;  Laterality: Right;  . ECTOPIC PREGNANCY SURGERY    . ERCP N/A 05/05/2019   Procedure: ENDOSCOPIC RETROGRADE CHOLANGIOPANCREATOGRAPHY (ERCP);  Surgeon: Ronnette Juniper, MD;  Location: [facility];  Service: Gastroenterology;  Laterality: N/A;  . IR BONE TUMOR(S)RF ABLATION  05/24/2018  . IR KYPHO LUMBAR INC FX REDUCE BONE BX UNI/BIL CANNULATION INC/IMAGING  05/24/2018  . IR RADIOLOGIST EVAL & MGMT  05/08/2018  . IR RADIOLOGIST EVAL & MGMT  06/18/2018  . IR RADIOLOGIST EVAL & MGMT  07/31/2018  . IR RADIOLOGIST EVAL & MGMT  12/19/2018  . IR RADIOLOGIST EVAL & MGMT  02/27/2019  . PANCREATIC STENT PLACEMENT  05/05/2019   Procedure: PANCREATIC STENT PLACEMENT;  Surgeon: Ronnette Juniper, MD;  Location: [facility] ENDOSCOPY;  Service: Gastroenterology;;  . Sol Passer PLACEMENT N/A 09/01/2015   Procedure: INSERTION PORT-A-CATH;  Surgeon: Florene Glen, MD;  Location: [facility] ORS;  Service: General;  Laterality: N/A;  . RADIOLOGY WITH ANESTHESIA N/A 11/06/2018   Procedure: CT-MICROWAVE THERMAL ABLATION/LIVER;  Surgeon: Corrie Mckusick, DO;  Location: [facility];  Service: Anesthesiology;  Laterality: N/A;  . REMOVAL OF STONES  05/05/2019   Procedure: REMOVAL OF STONES;  Surgeon: Ronnette Juniper, MD;  Location: [place];  Service: Gastroenterology;;  . SHOULDER SURGERY Left   . SPHINCTEROTOMY  05/05/2019   Procedure: SPHINCTEROTOMY;  Surgeon: Ronnette Juniper, MD;  Location: [facility] ENDOSCOPY;  Service: Gastroenterology;;    HEMATOLOGY/ONCOLOGY HISTORY:  Oncology History Overview Note  # FEB-MARCH 2017- COLON CANCER- STAGE IV [s/p right hemi-colectomy; pT4apN2 (7/19LN)]; Pre-op CEA- [id number]  PET-  mesenteric adenopathy/mediastinal adenopathy/right-sided subclavicular lymph node.    April 17th -START FOLFOX; May 1st Add Avastin;   Aug 16th- Disc OX [sec PN]; con 5FU-Avastin; NOV 27th CT C/A/P- CR; except  for 43m LUL; 5FU-Avastin q 3 W [Poor tolerance to FOLFOX].  # June/July 2019- Progression of Liver/Abd LN; July 2019- START FOLFIRI + Avastin; STOP iri 02/25/2018- sec to diarrhea; cont Avastin + 5FU  # March 2018- L2 uptake MET s/p RT [[facility]2018]; mid April X-geva  # L1 ablation/ostecool-  Jan 3rd 2020-   # march 2nd 2020- decrease 5FU CIV  to '2000mg'$ /m2/  #June 17th, 2020-liver ablation; 3.5 cm lesion.  Intolerance to 5-FU plus Avastin.  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 McQueen] CT-chest NEG; MRI brain negative.   # MOLECULAR TESTING: K-ras-EXON 2- MUTATED; MSI- STABLE.   DIAGNOSIS: colon ca  STAGE:  IV ;GOALS: pallaitive  CURRENT/MOST RECENT THERAPY: 5-FU plus Avastin    Cancer of ascending colon metastatic to intra-abdominal lymph node (HCC)    ALLERGIES:  has No Known Allergies.  MEDICATIONS:  Current Outpatient Medications  Medication Sig Dispense Refill  . acetaminophen (TYLENOL) 325 MG tablet Take 2 tablets (650 mg total) by mouth every 6 (six) hours as needed for mild pain (or Fever >/= 101).    .Marland KitchenamLODipine (NORVASC) 5 MG tablet Take 2 tablets (10 mg total) by mouth daily. 30 tablet 6  . Calcium Carbonate-Vitamin D (CALCIUM 600/VITAMIN D PO) Take 1 tablet by mouth every evening.     . cloNIDine (CATAPRES - DOSED IN MG/24 HR) 0.1 mg/24hr patch Place 1 patch (0.1 mg total) onto the skin every Monday at 6 PM. 4 patch 12  . diphenoxylate-atropine (LOMOTIL) 2.5-0.025 MG tablet Take 1 tablet by mouth 4 (four) times daily as needed for diarrhea or loose stools. Take it along with immodium 60 tablet 4  . dronabinol (MARINOL) 5 MG capsule Take 1 capsule (5 mg total)  by mouth 2 (two) times daily before lunch and supper. 60 capsule 4  . DULoxetine (CYMBALTA) 20 MG capsule Take 2 capsules (40 mg total) by mouth daily. (Patient taking differently: Take 20 mg by mouth 2 (two) times daily. ) 180 capsule 3  . feeding supplement (BOOST HIGH PROTEIN) LIQD Take 1 Container by mouth daily.     . fentaNYL (DURAGESIC) 50 MCG/HR Place 1 patch onto the skin every 3 (three) days. 10 patch 0  . ferrous sulfate (FERROUSUL) 325 (65 FE) MG tablet Take 1 tablet (325 mg total) by mouth daily with breakfast. 90 tablet 3  . gabapentin (NEURONTIN) 300 MG capsule Take 1-2 capsules (300-600 mg total) by mouth See admin instructions. Take 1 capsule (300 mg) by mouth daily in the morning & take 2 capsules (600 mg) by mouth at night. 90 capsule 6  . HYDROcodone-acetaminophen (NORCO/VICODIN) 5-325 MG tablet Take 1 tablet by mouth every 8 (eight) hours as needed (pain.). 90 tablet 0  . lidocaine-prilocaine (EMLA) cream Apply 1 application topically as needed. Apply generously over the Mediport 45 minutes prior to chemotherapy. (  Patient taking differently: Apply 1 application topically as needed (prior to chemotherapy.). ) 30 g 6  . loperamide (IMODIUM) 2 MG capsule Take 2 mg by mouth 4 (four) times daily as needed for diarrhea or loose stools.     . Multiple Vitamin (MULTIVITAMIN WITH MINERALS) TABS tablet Take 1 tablet by mouth daily.    . ondansetron (ZOFRAN) 8 MG tablet One pill every 8 hours as needed for nausea/vomitting. (Patient taking differently: Take 8 mg by mouth every 8 (eight) hours as needed for nausea or vomiting. ) 60 tablet 0  . potassium chloride SA (K-DUR) 20 MEQ tablet Take 1 tablet (20 mEq total) by mouth 2 (two) times daily. (Patient taking differently: Take 20 mEq by mouth 4 (four) times daily. ) 60 tablet 6  . prochlorperazine (COMPAZINE) 10 MG tablet Take 1 tablet (10 mg total) by mouth every 6 (six) hours as needed for nausea or vomiting. 90 tablet 3   No current  facility-administered medications for this visit.   Facility-Administered Medications Ordered in Other Visits  Medication Dose Route Frequency Provider Last Rate Last Admin  . sodium chloride flush (NS) 0.9 % injection 10 mL  10 mL Intravenous PRN Cammie Sickle, MD   10 mL at 10/04/15 0901  . sodium chloride flush (NS) 0.9 % injection 10 mL  10 mL Intravenous PRN Cammie Sickle, MD   10 mL at 07/02/17 1020    VITAL SIGNS: LMP  (LMP Unknown) Comment: LMP MORE THAN 5 YRS There were no vitals filed for this visit.  Estimated body mass index is 16.21 kg/m as calculated from the following:   Height as of 05/05/19: 5' (1.524 m).   Weight as of an earlier encounter on 05/14/19: 83 lb (37.6 kg).  LABS: CBC:    Component Value Date/Time   WBC 6.2 05/14/2019 1021   HGB 10.2 (L) 05/14/2019 1021   HCT 33.4 (L) 05/14/2019 1021   PLT 194 05/14/2019 1021   MCV 92.0 05/14/2019 1021   NEUTROABS 4.4 05/14/2019 1021   LYMPHSABS 0.7 05/14/2019 1021   MONOABS 1.0 05/14/2019 1021   EOSABS 0.1 05/14/2019 1021   BASOSABS 0.0 05/14/2019 1021   Comprehensive Metabolic Panel:    Component Value Date/Time   NA 140 05/14/2019 1021   K 3.6 05/14/2019 1021   CL 107 05/14/2019 1021   CO2 24 05/14/2019 1021   BUN 6 05/14/2019 1021   CREATININE 0.60 05/14/2019 1021   GLUCOSE 128 (H) 05/14/2019 1021   CALCIUM 8.9 05/14/2019 1021   AST 18 05/14/2019 1021   ALT 24 05/14/2019 1021   ALKPHOS 259 (H) 05/14/2019 1021   BILITOT 2.4 (H) 05/14/2019 1021   PROT 6.5 05/14/2019 1021   ALBUMIN 2.9 (L) 05/14/2019 1021    RADIOGRAPHIC STUDIES: CT ABDOMEN PELVIS W CONTRAST  Result Date: 05/02/2019 CLINICAL DATA:  Colorectal carcinoma with metastasis. Patient status post RIGHT hemicolectomy and liver ablation. EXAM: CT ABDOMEN AND PELVIS WITH CONTRAST TECHNIQUE: Multidetector CT imaging of the abdomen and pelvis was performed using the standard protocol following bolus administration of intravenous  contrast. CONTRAST:  44m OMNIPAQUE IOHEXOL 300 MG/ML  SOLN COMPARISON:  CT 02/24/2019 FINDINGS: Lower chest: Lung bases are clear. Hepatobiliary: New intrahepatic and extrahepatic biliary duct dilatation. Ductal dilatation extends into the LEFT and RIGHT hepatic lobes and is moderate in severity. The duct dilatation appears to occur to the level of the proximal common bile duct. The exact location of the obstructed is difficult to define but  appears to originate just before pancreatic head (image 19/2). 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 Additionally there is new dilatation of the gallbladder to 5 cm increased from 2 cm on prior. Moderate volume pericholecystic fluid. Within liver parenchyma there is a new low-density lesion in the caudate lobe measuring 13 mm (image 4/2.) potential new lesion adjacent to to fluid RIGHT hepatic lobe lesion measuring 14 mm (image 13/2). The third additional new lesion along the inferior aspect of the central LEFT hepatic lobe (segment 4B) measuring 13 mm on image 19/2. Pancreas: Pancreas appears grossly normal. No pancreatic duct dilatation. Spleen: Normal spleen Adrenals/urinary tract: Adrenal glands and kidneys are normal. The ureters and bladder normal. Stomach/Bowel: Stomach, duodenum and small bowel grossly normal. Post partial RIGHT hemicolectomy. No evidence of bowel obstruction. There is mild submucosal edema and mucosal enhancement through the sigmoid colon rectum which is new from prior. Vascular/Lymphatic: Abdominal aorta is normal caliber. Constriction of the RIGHT portal vein at the takeoff from the main portal vein as described the hepatic biliary findings section. No periportal or retroperitoneal adenopathy. No pelvic adenopathy. Reproductive: Uterus and necks are normal. Other: No free fluid. Musculoskeletal: No aggressive osseous lesion. IMPRESSION: 1. New biliary obstruction at the  level of the mid to distal common bile duct. 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 2. Biliary obstruction involves LEFT and RIGHT hepatic lobe as well as new distension of the gallbladder. Recommend correlation with bilirubin levels as patient may require palliative relief of obstruction. 3. Three new low-density lesions in liver concerning for hepatic metastasis. 4. Submucosal edema and mild mucosal enhancement in the LEFT colon and rectosigmoid colon is nonspecific but could represent colitis. [facility] on 05/02/2019  at12:26. Electronically Signed   By: Suzy Bouchard M.D.   On: 05/02/2019 12:26   DG ERCP BILIARY & PANCREATIC DUCTS  Result Date: 05/05/2019 CLINICAL DATA:  Biliary dilatation. History of metastatic colon cancer. EXAM: ERCP TECHNIQUE: Multiple spot images obtained with the fluoroscopic device and submitted for interpretation post-procedure. FLUOROSCOPY TIME:  Fluoroscopy Time:  2 minutes and 49 seconds Number of Acquired Spot Images: 4 COMPARISON:  Abdominal CT 05/02/2019 FINDINGS: First image demonstrates a wire in the main pancreatic duct. The biliary system was cannulated and opacified with contrast. A metallic stent was placed in the common bile duct. There is narrowing in the distal aspect of the biliary stent. IMPRESSION: Placement of a biliary stent. These images were submitted for radiologic interpretation only. Please see the procedural report for the amount of contrast and the fluoroscopy time utilized. Electronically Signed   By: Markus Daft M.D.   On: 05/05/2019 14:37    PERFORMANCE STATUS (ECOG) : 1 - Symptomatic but completely ambulatory  Review of Systems Unless otherwise noted, a complete review of systems is negative.  Physical Exam General: NAD, frail appearing, thin Pulmonary: Unlabored Extremities: no edema, no joint deformities Skin: no  rashes Neurological: Weakness but otherwise nonfocal  IMPRESSION: Routine follow-up visit made today.  Patient reports that she is doing reasonably well.  She denies any acute changes or concerns.  She does endorse fatigue but says it is not limiting her ability to engage in activities she finds meaningful.  She says she is sleeping well at night.  She denies feelings of hopelessness, anxiety, or depression.  Oral intake is still marginal but  patient says she is now taking supplements twice daily.  Weight is down another 3 pounds to current weight of 83 pounds today.  Again discussed the importance of caloric intake.  Patient is being followed by our dietitian.  Case discussed with Dr. Rogue Bussing  PLAN: -Continue current scope of treatment -MOST form previously reviewed -Follow-up telephone visit in 3 to 4 weeks.   Patient expressed understanding and was in agreement with this plan. She also understands that She can call the clinic at any time with any questions, concerns, or complaints.     Time Total: 15 minutes  Visit consisted of counseling and education dealing with the complex and emotionally intense issues of symptom management and palliative care in the setting of serious and potentially life-threatening illness.Greater than 50%  of this time was spent counseling and coordinating care related to the above assessment and plan.  Signed by: Altha Harm, PhD, NP-C

## 2019-05-14 NOTE — Progress Notes (Signed)
[place] OFFICE PROGRESS NOTE  Patient Care Team: Patient, No Pcp Per as PCP - General (General Practice)  Cancer Staging No matching staging information was found for the patient.   Oncology History Overview Note  # [facility] 2017- COLON CANCER- STAGE IV [s/p right hemi-colectomy; pT4apN2 (7/19LN)]; Pre-op CEA- [id number]  PET-  mesenteric adenopathy/mediastinal adenopathy/right-sided subclavicular lymph node.    April 17th -START FOLFOX; May 1st Add Avastin;   Aug 16th- Disc OX [sec PN]; con 5FU-Avastin; NOV 27th CT C/A/P- CR; except for 4m LUL; 5FU-Avastin q 3 W [Poor tolerance to FOLFOX].  # June/July 2019- Progression of Liver/Abd LN; July 2019- START FOLFIRI + Avastin; STOP iri 02/25/2018- sec to diarrhea; cont Avastin + 5FU  # March 2018- L2 uptake MET s/p RT [[facility]2018]; mid April X-geva  # L1 ablation/ostecool-  Jan 3rd 2020-   # march 2nd 2020- decrease 5FU CIV  to 20076mm2/  #June 17th, 2020-liver ablation; 3.5 cm lesion.  Intolerance to 5-FU plus Avastin.  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 McQueen] CT-chest NEG; MRI brain negative.   # MOLECULAR TESTING: K-ras-EXON 2- MUTATED; MSI- STABLE.   # palliative care- Nancy Clark.   DIAGNOSIS: colon ca  STAGE:  IV ;GOALS: pallaitive  CURRENT/MOST RECENT THERAPY: ? Opdivo+ Regorefenib.     Cancer of ascending colon metastatic to intra-abdominal lymph node ([facility] 05/15/2019 -  Chemotherapy   The patient had nivolumab (OPDIVO) 240 mg in sodium chloride 0.9 % 100 mL chemo infusion, 240 mg, Intravenous, Once, 0 of 6 cycles  for chemotherapy treatment.      INTERVAL HISTORY:  Nancy TERRILL89.o.  female pleasant patient above history of metastatic adenocarcinoma of the colon-currently on 5-FU plus Avastin is here for follow-up/review the results of the restaging CAT scan.  Approximate 2 weeks ago and the patient had a CAT scan-she was noted to have distended gallbladder secondary extrinsic compression.  [facility]compression of the bile ducts s/p stenting.  Poor appetite; positive weight loss.   Review of Systems  Constitutional: Positive for malaise/fatigue. Negative for chills, diaphoresis and fever.  HENT: Negative for nosebleeds and sore throat.   Eyes: Negative for double vision.  Respiratory: Negative for cough, hemoptysis, sputum production, shortness of breath and wheezing.   Cardiovascular: Negative for chest pain, palpitations, orthopnea and leg swelling.  Gastrointestinal: Positive for abdominal pain and nausea. Negative for constipation, heartburn, melena and vomiting.  Genitourinary: Negative for dysuria, frequency and urgency.  Musculoskeletal: Positive for back pain. Negative for joint pain.  Skin: Negative.  Negative for itching and rash.  Neurological: Positive for tingling. Negative for dizziness, focal weakness, weakness and headaches.  Endo/Heme/Allergies: Does not bruise/bleed easily.  Psychiatric/Behavioral: Negative for depression. The patient is not nervous/anxious and does not have insomnia.       PAST MEDICAL HISTORY :  Past Medical History:  Diagnosis Date  . Chicken pox   . Colon cancer ([facility]   Partial colectomy 07/2015 + chemo tx's  . Hypertension   . Hypokalemia   . Menopause    > 5 yrs  . Peripheral neuropathy due to chemotherapy ([facility]   bi lat feet    PAST SURGICAL HISTORY :  Past Surgical History:  Procedure Laterality Date  . BILIARY STENT PLACEMENT  05/05/2019   Procedure: BILIARY STENT PLACEMENT;  Surgeon: Nancy Juniper, MD;  Location: [place];  Service: Gastroenterology;;   . BREAST BIOPSY Right    2007? pt unsure done by [facility]  . COLONOSCOPY    . COLONOSCOPY WITH PROPOFOL N/A 10/14/2015   Procedure: COLONOSCOPY WITH PROPOFOL;  Surgeon: Nancy Luster, MD;  Location: [facility] ENDOSCOPY;  Service: Endoscopy;  Laterality: N/A;  . COLOSTOMY REVISION Right 08/09/2015   Procedure: COLON RESECTION RIGHT;  Surgeon: Nancy Glen, MD;  Location: [facility] ORS;  Service: General;  Laterality: Right;  . ECTOPIC PREGNANCY SURGERY    . ERCP N/A 05/05/2019   Procedure: ENDOSCOPIC RETROGRADE CHOLANGIOPANCREATOGRAPHY (ERCP);  Surgeon: Nancy Juniper, MD;  Location: [place];  Service: Gastroenterology;  Laterality: N/A;  . IR BONE TUMOR(S)RF ABLATION  05/24/2018  . IR KYPHO LUMBAR INC FX REDUCE BONE BX UNI/BIL CANNULATION INC/IMAGING  05/24/2018  . IR RADIOLOGIST EVAL & MGMT  05/08/2018  . IR RADIOLOGIST EVAL & MGMT  06/18/2018  . IR RADIOLOGIST EVAL & MGMT  07/31/2018  . IR RADIOLOGIST EVAL & MGMT  12/19/2018  . IR RADIOLOGIST EVAL & MGMT  02/27/2019  . PANCREATIC STENT PLACEMENT  05/05/2019   Procedure: PANCREATIC STENT PLACEMENT;  Surgeon: Nancy Juniper, MD;  Location: [facility] ENDOSCOPY;  Service: Gastroenterology;;  . Sol Passer PLACEMENT N/A 09/01/2015   Procedure: INSERTION PORT-A-CATH;  Surgeon: Nancy Glen, MD;  Location: [facility] ORS;  Service: General;  Laterality: N/A;  . RADIOLOGY WITH ANESTHESIA N/A 11/06/2018   Procedure: CT-MICROWAVE THERMAL ABLATION/LIVER;  Surgeon: Nancy Mckusick, DO;  Location: [facility];  Service: Anesthesiology;  Laterality: N/A;  . REMOVAL OF STONES  05/05/2019   Procedure: REMOVAL OF STONES;  Surgeon: Nancy Juniper, MD;  Location: [place];  Service: Gastroenterology;;  . SHOULDER SURGERY Left   . SPHINCTEROTOMY  05/05/2019   Procedure: SPHINCTEROTOMY;  Surgeon: Nancy Juniper, MD;  Location: [facility] ENDOSCOPY;  Service: Gastroenterology;;    FAMILY HISTORY :   Family History  Problem Relation Age of Onset  . Hypertension Mother   . Arthritis Father   . Prostate  cancer Father   . Diabetes Maternal Grandmother   . Colon cancer Maternal Grandfather        colon    SOCIAL HISTORY:   Social History   Tobacco Use  . Smoking status: Former Smoker    Packs/day: 0.25    Years: 2.00    Pack years: 0.50  . Smokeless tobacco: Never Used  . Tobacco comment: recently quit  Substance Use Topics  . Alcohol use: No    Alcohol/week: 0.0 standard drinks  . Drug use: No    ALLERGIES:  has No Known Allergies.  MEDICATIONS:  Current Outpatient Medications  Medication Sig Dispense Refill  . acetaminophen (TYLENOL) 325 MG tablet Take 2 tablets (650 mg total) by mouth every 6 (six) hours as needed for mild pain (or Fever >/= 101).    Marland Kitchen amLODipine (NORVASC) 5 MG tablet Take 2 tablets (10 mg total) by mouth daily. 30 tablet 6  . Calcium Carbonate-Vitamin D (CALCIUM 600/VITAMIN D PO) Take 1 tablet by mouth every evening.     . cloNIDine (CATAPRES - DOSED IN MG/24 HR) 0.1 mg/24hr patch Place 1 patch (0.1 mg total) onto the skin every Monday at 6 PM. 4 patch 12  . diphenoxylate-atropine (LOMOTIL) 2.5-0.025 MG tablet Take 1 tablet by mouth 4 (four) times daily as needed for  diarrhea or loose stools. Take it along with immodium 60 tablet 4  . dronabinol (MARINOL) 5 MG capsule Take 1 capsule (5 mg total) by mouth 2 (two) times daily before lunch and supper. 60 capsule 4  . DULoxetine (CYMBALTA) 20 MG capsule Take 2 capsules (40 mg total) by mouth daily. (Patient taking differently: Take 20 mg by mouth 2 (two) times daily. ) 180 capsule 3  . feeding supplement (BOOST HIGH PROTEIN) LIQD Take 1 Container by mouth daily.     . fentaNYL (DURAGESIC) 50 MCG/HR Place 1 patch onto the skin every 3 (three) days. 10 patch 0  . ferrous sulfate (FERROUSUL) 325 (65 FE) MG tablet Take 1 tablet (325 mg total) by mouth daily with breakfast. 90 tablet 3  . gabapentin (NEURONTIN) 300 MG capsule Take 1-2 capsules (300-600 mg total) by mouth See admin instructions. Take 1 capsule (300 mg)  by mouth daily in the morning & take 2 capsules (600 mg) by mouth at night. 90 capsule 6  . HYDROcodone-acetaminophen (NORCO/VICODIN) 5-325 MG tablet Take 1 tablet by mouth every 8 (eight) hours as needed (pain.). 90 tablet 0  . lidocaine-prilocaine (EMLA) cream Apply 1 application topically as needed. Apply generously over the Mediport 45 minutes prior to chemotherapy. (Patient taking differently: Apply 1 application topically as needed (prior to chemotherapy.). ) 30 g 6  . loperamide (IMODIUM) 2 MG capsule Take 2 mg by mouth 4 (four) times daily as needed for diarrhea or loose stools.     . Multiple Vitamin (MULTIVITAMIN WITH MINERALS) TABS tablet Take 1 tablet by mouth daily.    . ondansetron (ZOFRAN) 8 MG tablet One pill every 8 hours as needed for nausea/vomitting. (Patient taking differently: Take 8 mg by mouth every 8 (eight) hours as needed for nausea or vomiting. ) 60 tablet 0  . potassium chloride SA (K-DUR) 20 MEQ tablet Take 1 tablet (20 mEq total) by mouth 2 (two) times daily. (Patient taking differently: Take 20 mEq by mouth 4 (four) times daily. ) 60 tablet 6  . prochlorperazine (COMPAZINE) 10 MG tablet Take 1 tablet (10 mg total) by mouth every 6 (six) hours as needed for nausea or vomiting. 90 tablet 3   No current facility-administered medications for this visit.   Facility-Administered Medications Ordered in Other Visits  Medication Dose Route Frequency Provider Last Rate Last Admin  . sodium chloride flush (NS) 0.9 % injection 10 mL  10 mL Intravenous PRN Cammie Sickle, MD   10 mL at 10/04/15 0901  . sodium chloride flush (NS) 0.9 % injection 10 mL  10 mL Intravenous PRN Cammie Sickle, MD   10 mL at 07/02/17 1020    PHYSICAL EXAMINATION: ECOG PERFORMANCE STATUS: 1 - Symptomatic but completely ambulatory  BP (!) 191/81 (BP Location: Right Arm, Patient Position: Sitting, Cuff Size: Small)   Pulse 66   Temp (!) 96.6 F (35.9 C) (Tympanic)   Wt 83 lb (37.6 kg)    LMP  (LMP Unknown) Comment: LMP MORE THAN 5 YRS  BMI 16.21 kg/m   Filed Weights   05/14/19 1124  Weight: 83 lb (37.6 kg)    Physical Exam  Constitutional: She is oriented to person, place, and time and well-developed, well-nourished, and in no distress.  She is alone. She is walking herself.  HENT:  Head: Normocephalic and atraumatic.  Mouth/Throat: Oropharynx is clear and moist. No oropharyngeal exudate.  Eyes: Pupils are equal, round, and reactive to light.  Cardiovascular: Normal rate and  regular rhythm.  Pulmonary/Chest: Effort normal and breath sounds normal. No respiratory distress. She has no wheezes.  Abdominal: Soft. Bowel sounds are normal. She exhibits no distension and no mass. There is no abdominal tenderness. There is no rebound and no guarding.  Musculoskeletal:        General: No tenderness or edema. Normal range of motion.     Cervical back: Normal range of motion and neck supple.  Neurological: She is alert and oriented to person, place, and time.  Skin: Skin is warm.  Psychiatric: Affect normal.   LABORATORY DATA:  I have reviewed the data as listed    Component Value Date/Time   NA 140 05/14/2019 1021   K 3.6 05/14/2019 1021   CL 107 05/14/2019 1021   CO2 24 05/14/2019 1021   GLUCOSE 128 (H) 05/14/2019 1021   BUN 6 05/14/2019 1021   CREATININE 0.60 05/14/2019 1021   CALCIUM 8.9 05/14/2019 1021   PROT 6.5 05/14/2019 1021   ALBUMIN 2.9 (L) 05/14/2019 1021   AST 18 05/14/2019 1021   ALT 24 05/14/2019 1021   ALKPHOS 259 (H) 05/14/2019 1021   BILITOT 2.4 (H) 05/14/2019 1021   GFRNONAA >60 05/14/2019 1021   GFRAA >60 05/14/2019 1021    No results found for: SPEP, UPEP  Lab Results  Component Value Date   WBC 6.2 05/14/2019   NEUTROABS 4.4 05/14/2019   HGB 10.2 (L) 05/14/2019   HCT 33.4 (L) 05/14/2019   MCV 92.0 05/14/2019   PLT 194 05/14/2019      Chemistry      Component Value Date/Time   NA 140 05/14/2019 1021   K 3.6 05/14/2019 1021    CL 107 05/14/2019 1021   CO2 24 05/14/2019 1021   BUN 6 05/14/2019 1021   CREATININE 0.60 05/14/2019 1021      Component Value Date/Time   CALCIUM 8.9 05/14/2019 1021   ALKPHOS 259 (H) 05/14/2019 1021   AST 18 05/14/2019 1021   ALT 24 05/14/2019 1021   BILITOT 2.4 (H) 05/14/2019 1021       RADIOGRAPHIC STUDIES: I have personally reviewed the radiological images as listed and agreed with the findings in the report. No results found.   ASSESSMENT & PLAN:  Cancer of ascending colon metastatic to intra-abdominal lymph node ([place]) # Colon cancer- cecal/ right-sided; STAGE IV; On 5FU+ Avastin q 2 W x4 cycles; December 11th 2020-CT scan-gallbladder distention; worsening disease in the liver-up to 3 lesions new [13 to 14 mm in size].  #Given the progressive disease recommend discontinuation of Avastin/5-FU.  Recommend treatment with Opdivo plus Stivarga.  Discussed the recent data of early stage/phase 1B clinical trial-improved up to 30% response rates with combination therapy.  Discussed that regimen is not FDA approved.  Discussed the mechanism of action-of each drugs/ and potential side effects.   I discussed the mechanism of action; The goal of therapy is palliative; and length of treatments are likely ongoing/based upon the results of the scans. Discussed the potential side effects of immunotherapy including but not limited to diarrhea; skin rash; elevated LFTs/endocrine abnormalities etc.  # recommend starting to 80 mg/day dose. Discussed potential side effects including but not limited to- hand-foot syndrome and diarrhea oral ulcers; elevated blood pressures. Discussed regarding use of Imodium and Lomotil. Blood counts will be checked prior to each treatment cycle.   # Discussed option of clinical trials/tertiary centers; patient reluctant.   #Ongoing diarrhea-2-3 loose stools/ day; continue Lomotil-refilled; continue imodium; stable.    #  Abdominal discomfort/epigastric/ back pain  -Sec to malignancy; fentany/ hydocodone TID.  Stable.  #Hypertension- Currently on Norvasc 5 mg blood [id number] systolic.  Recommend compliance of blood pressure medications.  Monitor closely on/when started on Stivarga.  # hypokalemia- 3.6; one pill kdur a day. Continue kdur 20 meq/day.    # I offered to call the patient's family to give an update on clinical status; patient declines.   # DISPOSITION: # follow up in 2 weeks; labs;MD- - cbc/cmp/CEA;TSH- Opdivo[new]Dr.B    # 40 minutes face-to-face with the patient discussing the above plan of care; more than 50% of time spent on prognosis/ natural history; counseling and coordination.     Orders Placed This Encounter  Procedures  . TSH    Standing Status:   Future    Standing Expiration Date:   05/13/2020   All questions were answered. The patient knows to call the clinic with any problems, questions or concerns.      Cammie Sickle, MD 05/15/2019 9:25 AM

## 2019-05-15 ENCOUNTER — Telehealth: Payer: Self-pay | Admitting: Pharmacy Technician

## 2019-05-15 ENCOUNTER — Telehealth: Payer: Self-pay | Admitting: Pharmacist

## 2019-05-15 ENCOUNTER — Other Ambulatory Visit: Payer: Self-pay | Admitting: Internal Medicine

## 2019-05-15 DIAGNOSIS — C182 Malignant neoplasm of ascending colon: Secondary | ICD-10-CM

## 2019-05-15 DIAGNOSIS — C772 Secondary and unspecified malignant neoplasm of intra-abdominal lymph nodes: Secondary | ICD-10-CM

## 2019-05-15 LAB — CEA: CEA: 37.9 ng/mL — ABNORMAL HIGH (ref 0.0–4.7)

## 2019-05-15 MED ORDER — REGORAFENIB 40 MG PO TABS: 80.0000 mg | ORAL_TABLET | Freq: Every day | ORAL | 0 refills | Status: DC

## 2019-05-15 NOTE — Progress Notes (Signed)
DISCONTINUE ON PATHWAY REGIMEN - Colorectal     A cycle is every 14 days:     Irinotecan      Leucovorin      5-Fluorouracil      5-Fluorouracil      Bevacizumab   **Always confirm dose/schedule in your pharmacy ordering system**  REASON: Disease Progression PRIOR TREATMENT: [id number]: FOLFIRI + Bevacizumab q14 Days TREATMENT RESPONSE: Progressive Disease (PD)  START ON PATHWAY REGIMEN - Colorectal     A cycle is every 28 days:     Regorafenib   **Always confirm dose/schedule in your pharmacy ordering system**  Patient Characteristics: Distant Metastases, Nonsurgical Candidate, KRAS/NRAS Mutation Positive/Unknown (BRAF V600 Wild-Type/Unknown), Standard Cytotoxic Therapy, Fourth Line and Beyond Standard Cytotoxic Therapy Tumor Location: Colon Therapeutic Status: Distant Metastases Microsatellite/Mismatch Repair Status: MSS/pMMR BRAF Mutation Status: Wild-Type (no mutation) KRAS/NRAS Mutation Status: Mutation Positive Standard Cytotoxic Line of Therapy: Fourth Line and Beyond Standard Cytotoxic Therapy Intent of Therapy: Non-Curative / Palliative Intent, Discussed with Patient

## 2019-05-15 NOTE — Telephone Encounter (Signed)
Oral Oncology Pharmacist Encounter  Received new prescription for Stivarrga (regorafenib) for the treatment of metastatic colon cancer in conjunction with nivolumab, planned duration until disease progression or unacceptable drug toxicity.  CMP from 05/14/2019 assessed, no relevant lab abnormalities. BP from 05/14/2019 assessed, BP elevated. Recommend at home BP log and optimizing BP medications if BP continues to be uncontrolled. Prescription dose and frequency assessed.   Current medication list in Epic reviewed, no relevant DDIs with regorafenib identified.  Prescription has been e-scribed to the [facility] for benefits analysis and approval.  Oral Oncology Clinic will continue to follow for insurance authorization, copayment issues, initial counseling and start date.  Darl Pikes, PharmD, BCPS, [facility] Hematology/Oncology Clinical Pharmacist [facility]/HP/AP Oral [place] [facility] [phone]  05/15/2019 10:15 AM

## 2019-05-16 ENCOUNTER — Other Ambulatory Visit: Payer: Self-pay | Admitting: Internal Medicine

## 2019-05-16 DIAGNOSIS — C772 Secondary and unspecified malignant neoplasm of intra-abdominal lymph nodes: Secondary | ICD-10-CM

## 2019-05-16 DIAGNOSIS — C182 Malignant neoplasm of ascending colon: Secondary | ICD-10-CM

## 2019-05-20 NOTE — Telephone Encounter (Signed)
Oral Oncology Patient Advocate Encounter  Prior Authorization for Nancy Clark has been approved.    PA# [id number] Effective dates: 03/20/2019 through 05/18/2020  Patient must fill with [facility]. Copay card available if high copay.  Oral Oncology Clinic will continue to follow.   [place] Patient [place] Phone [phone] Fax [phone] 05/20/2019 4:14 PM

## 2019-05-20 NOTE — Telephone Encounter (Signed)
Oral Oncology Patient Advocate Encounter  Received notification from [facility]  that prior authorization for Nancy Clark is required.  PA submitted on CoverMyMeds on 05/15/2019. Key [id number]  Status is pending  Oral Oncology Clinic will continue to follow.   [place] Patient [place] Phone [phone] Fax [phone] 05/20/2019 10:27 AM

## 2019-05-21 ENCOUNTER — Encounter: Payer: BC Managed Care – PPO | Admitting: Hospice and Palliative Medicine

## 2019-05-27 MED ORDER — REGORAFENIB 40 MG PO TABS: 80.0000 mg | ORAL_TABLET | Freq: Every day | ORAL | 0 refills | Status: DC

## 2019-05-27 NOTE — Telephone Encounter (Signed)
Oral Chemotherapy Pharmacist Encounter  Due to insurance restriction the medication could not be filled at [place]. Prescription has been e-scribed to [place].  Supportive information was faxed to [place]. We will continue to follow medication access.   Darl Pikes, PharmD, BCPS, [facility] Hematology/Oncology Clinical Pharmacist [facility]/AP Oral [place] [facility] [phone]  05/27/2019 9:11 AM

## 2019-05-27 NOTE — Progress Notes (Signed)
Patient is coming in for follow up she recently was started on clonidine patch just started using this. No to all covid questions.

## 2019-05-28 ENCOUNTER — Other Ambulatory Visit: Payer: Self-pay | Admitting: Internal Medicine

## 2019-05-28 ENCOUNTER — Encounter: Payer: Self-pay | Admitting: Internal Medicine

## 2019-05-28 ENCOUNTER — Other Ambulatory Visit: Payer: Self-pay

## 2019-05-28 ENCOUNTER — Inpatient Hospital Stay: Payer: BC Managed Care – PPO | Attending: Internal Medicine | Admitting: *Deleted

## 2019-05-28 ENCOUNTER — Inpatient Hospital Stay (HOSPITAL_BASED_OUTPATIENT_CLINIC_OR_DEPARTMENT_OTHER): Payer: BC Managed Care – PPO | Admitting: Internal Medicine

## 2019-05-28 ENCOUNTER — Inpatient Hospital Stay: Payer: BC Managed Care – PPO

## 2019-05-28 VITALS — BP 126/66 | HR 68 | Resp 17

## 2019-05-28 VITALS — BP 159/76 | HR 70 | Temp 98.4°F | Wt 87.2 lb

## 2019-05-28 DIAGNOSIS — C787 Secondary malignant neoplasm of liver and intrahepatic bile duct: Secondary | ICD-10-CM | POA: Diagnosis not present

## 2019-05-28 DIAGNOSIS — C7951 Secondary malignant neoplasm of bone: Secondary | ICD-10-CM

## 2019-05-28 DIAGNOSIS — C182 Malignant neoplasm of ascending colon: Secondary | ICD-10-CM

## 2019-05-28 DIAGNOSIS — C772 Secondary and unspecified malignant neoplasm of intra-abdominal lymph nodes: Secondary | ICD-10-CM | POA: Insufficient documentation

## 2019-05-28 DIAGNOSIS — R5383 Other fatigue: Secondary | ICD-10-CM

## 2019-05-28 DIAGNOSIS — Z79899 Other long term (current) drug therapy: Secondary | ICD-10-CM | POA: Diagnosis not present

## 2019-05-28 DIAGNOSIS — E876 Hypokalemia: Secondary | ICD-10-CM | POA: Diagnosis not present

## 2019-05-28 DIAGNOSIS — C180 Malignant neoplasm of cecum: Secondary | ICD-10-CM | POA: Insufficient documentation

## 2019-05-28 DIAGNOSIS — Z95828 Presence of other vascular implants and grafts: Secondary | ICD-10-CM

## 2019-05-28 DIAGNOSIS — Z5112 Encounter for antineoplastic immunotherapy: Secondary | ICD-10-CM | POA: Insufficient documentation

## 2019-05-28 LAB — CBC WITH DIFFERENTIAL/PLATELET
Abs Immature Granulocytes: 0.02 10*3/uL (ref 0.00–0.07)
Basophils Absolute: 0 10*3/uL (ref 0.0–0.1)
Basophils Relative: 1 %
Eosinophils Absolute: 0.1 10*3/uL (ref 0.0–0.5)
Eosinophils Relative: 1 %
HCT: 29.2 % — ABNORMAL LOW (ref 36.0–46.0)
Hemoglobin: 8.8 g/dL — ABNORMAL LOW (ref 12.0–15.0)
Immature Granulocytes: 0 %
Lymphocytes Relative: 9 %
Lymphs Abs: 0.5 10*3/uL — ABNORMAL LOW (ref 0.7–4.0)
MCH: 27.2 pg (ref 26.0–34.0)
MCHC: 30.1 g/dL (ref 30.0–36.0)
MCV: 90.4 fL (ref 80.0–100.0)
Monocytes Absolute: 0.7 10*3/uL (ref 0.1–1.0)
Monocytes Relative: 13 %
Neutro Abs: 4.5 10*3/uL (ref 1.7–7.7)
Neutrophils Relative %: 76 %
Platelets: 119 10*3/uL — ABNORMAL LOW (ref 150–400)
RBC: 3.23 MIL/uL — ABNORMAL LOW (ref 3.87–5.11)
RDW: 19.4 % — ABNORMAL HIGH (ref 11.5–15.5)
WBC: 5.9 10*3/uL (ref 4.0–10.5)
nRBC: 0 % (ref 0.0–0.2)

## 2019-05-28 LAB — COMPREHENSIVE METABOLIC PANEL
ALT: 12 U/L (ref 0–44)
AST: 16 U/L (ref 15–41)
Albumin: 2.5 g/dL — ABNORMAL LOW (ref 3.5–5.0)
Alkaline Phosphatase: 230 U/L — ABNORMAL HIGH (ref 38–126)
Anion gap: 11 (ref 5–15)
BUN: 8 mg/dL (ref 6–20)
CO2: 26 mmol/L (ref 22–32)
Calcium: 8.3 mg/dL — ABNORMAL LOW (ref 8.9–10.3)
Chloride: 104 mmol/L (ref 98–111)
Creatinine, Ser: 0.53 mg/dL (ref 0.44–1.00)
GFR calc Af Amer: >60 mL/min (ref >60)
GFR calc non Af Amer: >60 mL/min (ref >60)
Glucose, Bld: 108 mg/dL — ABNORMAL HIGH (ref 70–99)
Potassium: 2.4 mmol/L — CL (ref 3.5–5.1)
Sodium: 141 mmol/L (ref 135–145)
Total Bilirubin: 1.4 mg/dL — ABNORMAL HIGH (ref 0.3–1.2)
Total Protein: 5.7 g/dL — ABNORMAL LOW (ref 6.5–8.1)

## 2019-05-28 LAB — TSH: TSH: 0.207 u[IU]/mL — ABNORMAL LOW (ref 0.350–4.500)

## 2019-05-28 MED ORDER — POTASSIUM CHLORIDE 20 MEQ/100ML IV SOLN
20.0000 meq | Freq: Once | INTRAVENOUS | Status: AC
  Administered 2019-05-28: 20 meq via INTRAVENOUS

## 2019-05-28 MED ORDER — SODIUM CHLORIDE 0.9 % IV SOLN
240.0000 mg | Freq: Once | INTRAVENOUS | Status: AC
  Administered 2019-05-28: 240 mg via INTRAVENOUS
  Filled 2019-05-28: qty 24

## 2019-05-28 MED ORDER — HEPARIN SOD (PORK) LOCK FLUSH 100 UNIT/ML IV SOLN
INTRAVENOUS | Status: AC
  Filled 2019-05-28: qty 5

## 2019-05-28 MED ORDER — SODIUM CHLORIDE 0.9 % IV SOLN
Freq: Once | INTRAVENOUS | Status: AC
  Filled 2019-05-28: qty 250

## 2019-05-28 MED ORDER — HEPARIN SOD (PORK) LOCK FLUSH 100 UNIT/ML IV SOLN
500.0000 [IU] | Freq: Once | INTRAVENOUS | Status: AC | PRN
  Administered 2019-05-28: 500 [IU]
  Filled 2019-05-28: qty 5

## 2019-05-28 MED ORDER — SODIUM CHLORIDE 0.9% FLUSH
10.0000 mL | Freq: Once | INTRAVENOUS | Status: AC
  Administered 2019-05-28: 11:00:00 10 mL via INTRAVENOUS
  Filled 2019-05-28: qty 10

## 2019-05-28 MED ORDER — FENTANYL 50 MCG/HR TD PT72: 1.0000 | MEDICATED_PATCH | TRANSDERMAL | 0 refills | Status: DC

## 2019-05-28 NOTE — Progress Notes (Signed)
Pt tolerated Opdivo infusion well with no signs of complications or reaction. RN educated pt on the importance of notifying the clinic if any complications occur at home. Pt verbalized understanding and all questions answered at this time. Pt stable for discharged.   [name] [employer]

## 2019-05-28 NOTE — Progress Notes (Signed)
Add thyroid profile as TSH is low.

## 2019-05-28 NOTE — Assessment & Plan Note (Addendum)
#   Colon cancer- cecal/ right-sided; STAGE IV; On 5FU+ Avastin q 2 W x4 cycles; December 11th 2020-CT scan-gallbladder distention; worsening disease in the liver-up to 3 lesions new [13 to 14 mm in size]. Discontinue Avastin/5-FU.   # Proceed with Opdivo plus Stivarga [started on 1/4]  Discussed the recent data of early stage/phase 1B clinical trial-improved up to 30% response rates with combination therapy. Treatments are palliative not curative.  Also reviewed regarding evaluation at tertiary center/clinical trial given limited treatment option available moving forward.   # I discussed the mechanism of action; The goal of therapy is palliative; and length of treatments are likely ongoing/based upon the results of the scans. Discussed the potential side effects of immunotherapy including but not limited to diarrhea; skin rash; elevated LFTs/endocrine abnormalities etc.  # Ongoing diarrhea-1-2 loose stools/ day; continue Lomotil-refilled; continue imodium; stable.    # Abdominal discomfort/epigastric/ back pain -Sec to malignancy; fentany/ hydocodone TID.  Stable.  Refill ankle patch.  #Hypertension- Currently on Norvasc 5 mg blood pressure-stable systolic.  Recommend compliance of blood pressure medications. Monitor closley.   # hypokalemia-2.4; currently on one pill kdur a day. Recommend kdur 20 meq TID.  Also recommend KCl 20 mEq today; again next week  # I spoke at length with the patient's mother regarding the patient's clinical status/plan of care.  Family agreement.   # DISPOSITION: # IV KCL 20 meq today; OPDIVO today # 1 week- labs- bmp/mag; possible IV KCL # follow up in 2 weeks; labs;MD-  Cbc/cmp/magCEA; Opdivo]Dr.B

## 2019-05-28 NOTE — Progress Notes (Signed)
[facility] OFFICE PROGRESS NOTE  Patient Care Team: Patient, No Pcp Per as PCP - General (General Practice)  Cancer Staging No matching staging information was found for the patient.   Oncology History Overview Note  # FEB-MARCH 2017- COLON CANCER- STAGE IV [s/p right hemi-colectomy; pT4apN2 (7/19LN)]; Pre-op CEA- 7.April2017-  PET-  mesenteric adenopathy/mediastinal adenopathy/right-sided subclavicular lymph node.    April 17th -START FOLFOX; May 1st Add Avastin;   Aug 16th- Disc OX [sec PN]; con 5FU-Avastin; NOV 27th CT C/A/P- CR; except for 4mm LUL; 5FU-Avastin q 3 W [Poor tolerance to FOLFOX].  # June/July 2019- Progression of Liver/Abd LN; July 2019- START FOLFIRI + Avastin; STOP iri 02/25/2018- sec to diarrhea; cont Avastin + 5FU  # March 2018- L2 uptake MET s/p RT [May 2018]; mid April X-geva  # L1 ablation/ostecool-  Jan 3rd 2020-   # march 2nd 2020- decrease 5FU CIV  to 2000mg/m2/  #June 17th, 2020-liver ablation; 3.5 cm lesion.  Intolerance to 5-FU plus Avastin.  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 McQueen] CT-chest NEG; MRI brain negative.   # MOLECULAR TESTING: K-ras-EXON 2- MUTATED; MSI- STABLE.   # palliative care- [name].   DIAGNOSIS: colon ca  STAGE:  IV ;GOALS: pallaitive  CURRENT/MOST RECENT THERAPY: Opdivo [C]+ Regorefenib.     Cancer of ascending colon metastatic to intra-abdominal lymph node (HCC)  05/28/2019 -  Chemotherapy   The patient had nivolumab (OPDIVO) 240 mg in sodium chloride 0.9 % 100 mL chemo infusion, 240 mg, Intravenous, Once, 1 of 6 cycles  for chemotherapy treatment.      INTERVAL HISTORY:  Shakeera S  Sabina 59 y.o.  female pleasant patient above history of metastatic adenocarcinoma of the colon-most recently on 5-FU plus Avastin -with noted progression on recent imaging is here for follow-up.  Patient is here to proceed with OPDIVO + REGO.  Patient started regorafenib on 1/04.  Denies any diarrhea.  Denies any nausea vomiting.  Abdominal pain is chronic-however stable on the fentanyl patch/hydrocodone.  Poor appetite.  Review of Systems  Constitutional: Positive for malaise/fatigue. Negative for chills, diaphoresis and fever.  HENT: Negative for nosebleeds and sore throat.   Eyes: Negative for double vision.  Respiratory: Negative for cough, hemoptysis, sputum production, shortness of breath and wheezing.   Cardiovascular: Negative for chest pain, palpitations, orthopnea and leg swelling.  Gastrointestinal: Positive for abdominal pain and nausea. Negative for constipation, heartburn, melena and vomiting.  Genitourinary: Negative for dysuria, frequency and urgency.  Musculoskeletal: Positive for back pain. Negative for joint pain.  Skin: Negative.  Negative for itching and rash.  Neurological: Positive for tingling. Negative for dizziness, focal weakness, weakness and headaches.  Endo/Heme/Allergies: Does not bruise/bleed easily.  Psychiatric/Behavioral: Negative for depression. The patient is not nervous/anxious and does not have insomnia.       PAST MEDICAL HISTORY :  Past Medical History:  Diagnosis Date  . Chicken pox   . Colon cancer (HCC)    Partial colectomy 07/2015 + chemo tx's  . Hypertension   . Hypokalemia   . Menopause    > 5 yrs  . Peripheral neuropathy due to chemotherapy (HCC)    bi lat feet    PAST SURGICAL HISTORY :   Past   Surgical History:  Procedure Laterality Date  . BILIARY STENT PLACEMENT  05/05/2019   Procedure: BILIARY STENT PLACEMENT;  Surgeon: Karki, Arya, MD;  Location: [facility];  Service: Gastroenterology;;  . BREAST BIOPSY Right    2007?  pt unsure done by [facility]  . COLONOSCOPY    . COLONOSCOPY WITH PROPOFOL N/A 10/14/2015   Procedure: COLONOSCOPY WITH PROPOFOL;  Surgeon: Paul Y Oh, MD;  Location: [facility];  Service: Endoscopy;  Laterality: N/A;  . COLOSTOMY REVISION Right 08/09/2015   Procedure: COLON RESECTION RIGHT;  Surgeon: Richard E Cooper, MD;  Location: [facility];  Service: General;  Laterality: Right;  . ECTOPIC PREGNANCY SURGERY    . ERCP N/A 05/05/2019   Procedure: ENDOSCOPIC RETROGRADE CHOLANGIOPANCREATOGRAPHY (ERCP);  Surgeon: Karki, Arya, MD;  Location: [facility];  Service: Gastroenterology;  Laterality: N/A;  . IR BONE TUMOR(S)RF ABLATION  05/24/2018  . IR KYPHO LUMBAR INC FX REDUCE BONE BX UNI/BIL CANNULATION INC/IMAGING  05/24/2018  . IR RADIOLOGIST EVAL & MGMT  05/08/2018  . IR RADIOLOGIST EVAL & MGMT  06/18/2018  . IR RADIOLOGIST EVAL & MGMT  07/31/2018  . IR RADIOLOGIST EVAL & MGMT  12/19/2018  . IR RADIOLOGIST EVAL & MGMT  02/27/2019  . PANCREATIC STENT PLACEMENT  05/05/2019   Procedure: PANCREATIC STENT PLACEMENT;  Surgeon: Karki, Arya, MD;  Location: [facility];  Service: Gastroenterology;;  . PORTACATH PLACEMENT N/A 09/01/2015   Procedure: INSERTION PORT-A-CATH;  Surgeon: Richard E Cooper, MD;  Location: [facility];  Service: General;  Laterality: N/A;  . RADIOLOGY WITH ANESTHESIA N/A 11/06/2018   Procedure: CT-MICROWAVE THERMAL ABLATION/LIVER;  Surgeon: Wagner, Jaime, DO;  Location: [facility];  Service: Anesthesiology;  Laterality: N/A;  . REMOVAL OF STONES  05/05/2019   Procedure: REMOVAL OF STONES;  Surgeon: Karki, Arya, MD;  Location: [facility];  Service: Gastroenterology;;  . SHOULDER SURGERY Left   . SPHINCTEROTOMY  05/05/2019   Procedure: SPHINCTEROTOMY;  Surgeon: Karki, Arya, MD;  Location: [facility];  Service: Gastroenterology;;    FAMILY HISTORY :   Family History  Problem Relation Age of Onset  . Hypertension Mother   . Arthritis Father   . Prostate cancer Father   . Diabetes Maternal  Grandmother   . Colon cancer Maternal Grandfather        colon    SOCIAL HISTORY:   Social History   Tobacco Use  . Smoking status: Former Smoker    Packs/day: 0.25    Years: 2.00    Pack years: 0.50  . Smokeless tobacco: Never Used  . Tobacco comment: recently quit  Substance Use Topics  . Alcohol use: No    Alcohol/week: 0.0 standard drinks  . Drug use: No    ALLERGIES:  has No Known Allergies.  MEDICATIONS:  Current Outpatient Medications  Medication Sig Dispense Refill  . acetaminophen (TYLENOL) 325 MG tablet Take 2 tablets (650 mg total) by mouth every 6 (six) hours as needed for mild pain (or Fever >/= 101).    . amLODipine (NORVASC) 5 MG tablet Take 2 tablets (10 mg total) by mouth daily. 30 tablet 6  . Calcium Carbonate-Vitamin D (CALCIUM 600/VITAMIN D PO) Take 1 tablet by mouth every evening.     . cloNIDine (CATAPRES - DOSED IN MG/24 HR) 0.1 mg/24hr patch Place 1 patch (0.1 mg total) onto the skin every Monday at 6 PM. 4 patch 12  . diphenoxylate-atropine (LOMOTIL) 2.5-0.025 MG tablet Take 1 tablet by mouth 4 (four) times daily as needed for diarrhea   or loose stools. Take it along with immodium 60 tablet 4  . dronabinol (MARINOL) 5 MG capsule Take 1 capsule (5 mg total) by mouth 2 (two) times daily before lunch and supper. 60 capsule 4  . DULoxetine (CYMBALTA) 20 MG capsule TAKE 2 CAPSULES DAILY 180 capsule 3  . feeding supplement (BOOST HIGH PROTEIN) LIQD Take 1 Container by mouth daily.     . fentaNYL (DURAGESIC) 50 MCG/HR Place 1 patch onto the skin every 3 (three) days. 10 patch 0  . ferrous sulfate (FERROUSUL) 325 (65 FE) MG tablet Take 1 tablet (325 mg total) by mouth daily with breakfast. 90 tablet 3  . gabapentin (NEURONTIN) 300 MG capsule Take 1-2 capsules (300-600 mg total) by mouth See admin instructions. Take 1 capsule (300 mg) by mouth daily in the morning & take 2 capsules (600 mg) by mouth at night. 90 capsule 6  . HYDROcodone-acetaminophen  (NORCO/VICODIN) 5-325 MG tablet Take 1 tablet by mouth every 8 (eight) hours as needed (pain.). 90 tablet 0  . lidocaine-prilocaine (EMLA) cream Apply 1 application topically as needed. Apply generously over the Mediport 45 minutes prior to chemotherapy. (Patient taking differently: Apply 1 application topically as needed (prior to chemotherapy.). ) 30 g 6  . loperamide (IMODIUM) 2 MG capsule Take 2 mg by mouth 4 (four) times daily as needed for diarrhea or loose stools.     . Multiple Vitamin (MULTIVITAMIN WITH MINERALS) TABS tablet Take 1 tablet by mouth daily.    . ondansetron (ZOFRAN) 8 MG tablet One pill every 8 hours as needed for nausea/vomitting. (Patient taking differently: Take 8 mg by mouth every 8 (eight) hours as needed for nausea or vomiting. ) 60 tablet 0  . potassium chloride SA (K-DUR) 20 MEQ tablet Take 1 tablet (20 mEq total) by mouth 2 (two) times daily. (Patient taking differently: Take 20 mEq by mouth 4 (four) times daily. ) 60 tablet 6  . prochlorperazine (COMPAZINE) 10 MG tablet Take 1 tablet (10 mg total) by mouth every 6 (six) hours as needed for nausea or vomiting. 90 tablet 3  . regorafenib (STIVARGA) 40 MG tablet Take 2 tablets (80 mg total) by mouth daily with breakfast. Take for 3 weeks; and 1 week OFF. Take with low fat meal. DO NOT START UNTIL OK by MD. 42 tablet 0   No current facility-administered medications for this visit.   Facility-Administered Medications Ordered in Other Visits  Medication Dose Route Frequency Provider Last Rate Last Admin  . heparin lock flush 100 unit/mL  500 Units Intracatheter Once PRN Cammie Sickle, MD      . nivolumab (OPDIVO) 240 mg in sodium chloride 0.9 % 100 mL chemo infusion  240 mg Intravenous Once Charlaine Dalton R, MD      . sodium chloride flush (NS) 0.9 % injection 10 mL  10 mL Intravenous PRN Cammie Sickle, MD   10 mL at 10/04/15 0901  . sodium chloride flush (NS) 0.9 % injection 10 mL  10 mL Intravenous  PRN Cammie Sickle, MD   10 mL at 07/02/17 1020    PHYSICAL EXAMINATION: ECOG PERFORMANCE STATUS: 1 - Symptomatic but completely ambulatory  BP (!) 159/76 (BP Location: Left Arm, Patient Position: Sitting, Cuff Size: Small)   Pulse 70   Temp 98.4 F (36.9 C) (Tympanic)   Wt 87 lb 3.2 oz (39.6 kg)   LMP  (LMP Unknown) Comment: LMP MORE THAN 5 YRS  BMI 17.03 kg/m   [employer]  05/28/19 1109  Weight: 87 lb 3.2 oz (39.6 kg)    Physical Exam  Constitutional: She is oriented to person, place, and time and well-developed, well-nourished, and in no distress.  She is alone. She is walking herself.  HENT:  Head: Normocephalic and atraumatic.  Mouth/Throat: Oropharynx is clear and moist. No oropharyngeal exudate.  Eyes: Pupils are equal, round, and reactive to light.  Cardiovascular: Normal rate and regular rhythm.  Pulmonary/Chest: Effort normal and breath sounds normal. No respiratory distress. She has no wheezes.  Abdominal: Soft. Bowel sounds are normal. She exhibits no distension and no mass. There is no abdominal tenderness. There is no rebound and no guarding.  Musculoskeletal:        General: No tenderness or edema. Normal range of motion.     Cervical back: Normal range of motion and neck supple.  Neurological: She is alert and oriented to person, place, and time.  Skin: Skin is warm.  Psychiatric: Affect normal.   LABORATORY DATA:  I have reviewed the data as listed    Component Value Date/Time   NA 141 05/28/2019 1036   K 2.4 (LL) 05/28/2019 1036   CL 104 05/28/2019 1036   CO2 26 05/28/2019 1036   GLUCOSE 108 (H) 05/28/2019 1036   BUN 8 05/28/2019 1036   CREATININE 0.53 05/28/2019 1036   CALCIUM 8.3 (L) 05/28/2019 1036   PROT 5.7 (L) 05/28/2019 1036   ALBUMIN 2.5 (L) 05/28/2019 1036   AST 16 05/28/2019 1036   ALT 12 05/28/2019 1036   ALKPHOS 230 (H) 05/28/2019 1036   BILITOT 1.4 (H) 05/28/2019 1036   GFRNONAA >60 05/28/2019 1036   GFRAA >60  05/28/2019 1036    No results found for: SPEP, UPEP  Lab Results  Component Value Date   WBC 5.9 05/28/2019   NEUTROABS 4.5 05/28/2019   HGB 8.8 (L) 05/28/2019   HCT 29.2 (L) 05/28/2019   MCV 90.4 05/28/2019   PLT 119 (L) 05/28/2019      Chemistry      Component Value Date/Time   NA 141 05/28/2019 1036   K 2.4 (LL) 05/28/2019 1036   CL 104 05/28/2019 1036   CO2 26 05/28/2019 1036   BUN 8 05/28/2019 1036   CREATININE 0.53 05/28/2019 1036      Component Value Date/Time   CALCIUM 8.3 (L) 05/28/2019 1036   ALKPHOS 230 (H) 05/28/2019 1036   AST 16 05/28/2019 1036   ALT 12 05/28/2019 1036   BILITOT 1.4 (H) 05/28/2019 1036       RADIOGRAPHIC STUDIES: I have personally reviewed the radiological images as listed and agreed with the findings in the report. No results found.   ASSESSMENT & PLAN:  Cancer of ascending colon metastatic to intra-abdominal lymph node (HCC) # Colon cancer- cecal/ right-sided; STAGE IV; On 5FU+ Avastin q 2 W x4 cycles; December 11th 2020-CT scan-gallbladder distention; worsening disease in the liver-up to 3 lesions new [13 to 14 mm in size]. Discontinue Avastin/5-FU.   # Proceed with Opdivo plus Stivarga [started on 1/4]  Discussed the recent data of early stage/phase 1B clinical trial-improved up to 30% response rates with combination therapy. Treatments are palliative not curative.  Also reviewed regarding evaluation at tertiary center/clinical trial given limited treatment option available moving forward.   # I discussed the mechanism of action; The goal of therapy is palliative; and length of treatments are likely ongoing/based upon the results of the scans. Discussed the potential side effects of immunotherapy including but not   limited to diarrhea; skin rash; elevated LFTs/endocrine abnormalities etc.  # Ongoing diarrhea-1-2 loose stools/ day; continue Lomotil-refilled; continue imodium; stable.    # Abdominal discomfort/epigastric/ back pain -Sec  to malignancy; fentany/ hydocodone TID.  Stable.  Refill ankle patch.  #Hypertension- Currently on Norvasc 5 mg blood pressure-stable systolic.  Recommend compliance of blood pressure medications. Monitor closley.   # hypokalemia-2.4; currently on one pill kdur a day. Recommend kdur 20 meq TID.  Also recommend KCl 20 mEq today; again next week  # I spoke at length with the patient's mother regarding the patient's clinical status/plan of care.  Family agreement.   # DISPOSITION: # IV KCL 20 meq today; OPDIVO today # 1 week- labs- bmp/mag; possible IV KCL # follow up in 2 weeks; labs;MD-  Cbc/cmp/magCEA; Opdivo]Dr.B        Orders Placed This Encounter  Procedures  . CBC with Differential    Standing Status:   Future    Standing Expiration Date:   05/27/2020  . Basic metabolic panel    Standing Status:   Future    Standing Expiration Date:   05/27/2020  . Magnesium    Standing Status:   Future    Standing Expiration Date:   05/27/2020  . Comprehensive metabolic panel    Standing Status:   Future    Standing Expiration Date:   05/27/2020  . CEA    Standing Status:   Future    Standing Expiration Date:   05/27/2020  . Magnesium    Standing Status:   Future    Standing Expiration Date:   05/27/2020   All questions were answered. The patient knows to call the clinic with any problems, questions or concerns.      Govinda R Brahmanday, MD 05/28/2019 12:59 PM  

## 2019-05-29 LAB — CEA: CEA: 42.4 ng/mL — ABNORMAL HIGH (ref 0.0–4.7)

## 2019-05-30 NOTE — Telephone Encounter (Signed)
05/29/19:  Called [facility] to check status of Stivarga.  Representative stated that patient provided new insurance information and they were in the process of a benefits investigation.  Will call back today to verify copay and shipment status.

## 2019-06-04 ENCOUNTER — Inpatient Hospital Stay (HOSPITAL_BASED_OUTPATIENT_CLINIC_OR_DEPARTMENT_OTHER): Payer: BC Managed Care – PPO | Admitting: Hospice and Palliative Medicine

## 2019-06-04 ENCOUNTER — Other Ambulatory Visit: Payer: Self-pay

## 2019-06-04 DIAGNOSIS — Z515 Encounter for palliative care: Secondary | ICD-10-CM

## 2019-06-04 NOTE — Progress Notes (Signed)
Virtual Visit via Telephone Note  I connected with [name] on 06/04/19 at  2:30 PM EST by telephone and verified that I am speaking with the correct person using two identifiers.   I discussed the limitations, risks, security and privacy concerns of performing an evaluation and management service by telephone and the availability of in person appointments. I also discussed with the patient that there may be a patient responsible charge related to this service. The patient expressed understanding and agreed to proceed.   History of Present Illness: Palliative Care consult requested for this 59 y.o. female with multiple medical problems including stage IV colorectal cancer status post right hemicolectomy and XRT.  She is also status post liver ablation to area of hepatic metastasis.  Patient is currently being treated on systemic therapy with 5-FU plus bevacizumab.  She was referred to palliative care to help discuss goals and manage ongoing symptoms.   Observations/Objective: I spoke with patient by phone.  Patient reports that she is doing well.  She denies any significant changes or concerns.  No distressing symptoms were reported.  She says that she is eating well.  She appears to be coping well with her disease and treatment so far.  Assessment and Plan: Stage IV colorectal cancer - on systemic therapy and followed by Dr. Rogue Bussing.  Patient is without distressing symptoms at present.  Will follow  Follow Up Instructions: Follow-up telephone visit in 3 to 4 weeks   I discussed the assessment and treatment plan with the patient. The patient was provided an opportunity to ask questions and all were answered. The patient agreed with the plan and demonstrated an understanding of the instructions.   The patient was advised to call back or seek an in-person evaluation if the symptoms worsen or if the condition fails to improve as anticipated.  I provided 5 minutes of non-face-to-face  time during this encounter.   Irean Hong, NP

## 2019-06-05 ENCOUNTER — Other Ambulatory Visit: Payer: Self-pay | Admitting: *Deleted

## 2019-06-05 ENCOUNTER — Other Ambulatory Visit: Payer: Medicare Other

## 2019-06-05 ENCOUNTER — Inpatient Hospital Stay: Payer: BC Managed Care – PPO | Admitting: *Deleted

## 2019-06-05 ENCOUNTER — Inpatient Hospital Stay: Payer: BC Managed Care – PPO

## 2019-06-05 ENCOUNTER — Ambulatory Visit: Payer: Medicare Other

## 2019-06-05 ENCOUNTER — Other Ambulatory Visit: Payer: Self-pay

## 2019-06-05 VITALS — BP 125/74 | HR 62 | Resp 18

## 2019-06-05 DIAGNOSIS — Z95828 Presence of other vascular implants and grafts: Secondary | ICD-10-CM

## 2019-06-05 DIAGNOSIS — C182 Malignant neoplasm of ascending colon: Secondary | ICD-10-CM

## 2019-06-05 DIAGNOSIS — E876 Hypokalemia: Secondary | ICD-10-CM

## 2019-06-05 DIAGNOSIS — C772 Secondary and unspecified malignant neoplasm of intra-abdominal lymph nodes: Secondary | ICD-10-CM

## 2019-06-05 DIAGNOSIS — C7951 Secondary malignant neoplasm of bone: Secondary | ICD-10-CM

## 2019-06-05 DIAGNOSIS — R5383 Other fatigue: Secondary | ICD-10-CM

## 2019-06-05 DIAGNOSIS — C180 Malignant neoplasm of cecum: Secondary | ICD-10-CM | POA: Diagnosis not present

## 2019-06-05 LAB — BASIC METABOLIC PANEL
Anion gap: 9 (ref 5–15)
BUN: 8 mg/dL (ref 6–20)
CO2: 27 mmol/L (ref 22–32)
Calcium: 8.1 mg/dL — ABNORMAL LOW (ref 8.9–10.3)
Chloride: 104 mmol/L (ref 98–111)
Creatinine, Ser: 0.47 mg/dL (ref 0.44–1.00)
GFR calc Af Amer: >60 mL/min (ref >60)
GFR calc non Af Amer: >60 mL/min (ref >60)
Glucose, Bld: 122 mg/dL — ABNORMAL HIGH (ref 70–99)
Potassium: 2.7 mmol/L — CL (ref 3.5–5.1)
Sodium: 140 mmol/L (ref 135–145)

## 2019-06-05 LAB — MAGNESIUM: Magnesium: 1.7 mg/dL (ref 1.7–2.4)

## 2019-06-05 MED ORDER — HEPARIN SOD (PORK) LOCK FLUSH 100 UNIT/ML IV SOLN
INTRAVENOUS | Status: AC
  Filled 2019-06-05: qty 5

## 2019-06-05 MED ORDER — HEPARIN SOD (PORK) LOCK FLUSH 100 UNIT/ML IV SOLN
500.0000 [IU] | Freq: Once | INTRAVENOUS | Status: AC
  Administered 2019-06-05: 500 [IU] via INTRAVENOUS
  Filled 2019-06-05: qty 5

## 2019-06-05 MED ORDER — POTASSIUM CHLORIDE CRYS ER 20 MEQ PO TBCR: 20.0000 meq | EXTENDED_RELEASE_TABLET | Freq: Three times a day (TID) | ORAL | 3 refills | Status: DC

## 2019-06-05 MED ORDER — SODIUM CHLORIDE 0.9 % IV SOLN
Freq: Once | INTRAVENOUS | Status: AC
  Filled 2019-06-05: qty 250

## 2019-06-05 MED ORDER — POTASSIUM CHLORIDE 20 MEQ/100ML IV SOLN
20.0000 meq | Freq: Once | INTRAVENOUS | Status: AC
  Administered 2019-06-05: 14:00:00 20 meq via INTRAVENOUS

## 2019-06-05 MED ORDER — SODIUM CHLORIDE 0.9% FLUSH
10.0000 mL | Freq: Once | INTRAVENOUS | Status: AC
  Administered 2019-06-05: 10 mL via INTRAVENOUS
  Filled 2019-06-05: qty 10

## 2019-06-05 NOTE — Telephone Encounter (Signed)
Patient potassium remains critical at 2.7. read back process performed with Maudie Mercury - lab tech and Dr. Rogue Bussing. Patient scheduled to receive iv potassium today. RN Confirmed with patient that she is currently taking her oral potassium 20 meq tablets three times daily. She states that she has enough potassium tablets currently; however, she will need [facility] Dr. Jacinto Reap - please approve RF

## 2019-06-06 LAB — THYROID PANEL WITH TSH
Free Thyroxine Index: 2.2 (ref 1.2–4.9)
T3 Uptake Ratio: 29 % (ref 24–39)
T4, Total: 7.6 ug/dL (ref 4.5–12.0)
TSH: 0.391 u[IU]/mL — ABNORMAL LOW (ref 0.450–4.500)

## 2019-06-11 ENCOUNTER — Other Ambulatory Visit: Payer: Self-pay

## 2019-06-11 ENCOUNTER — Inpatient Hospital Stay (HOSPITAL_BASED_OUTPATIENT_CLINIC_OR_DEPARTMENT_OTHER): Payer: BC Managed Care – PPO | Admitting: Internal Medicine

## 2019-06-11 ENCOUNTER — Inpatient Hospital Stay: Payer: BC Managed Care – PPO

## 2019-06-11 VITALS — BP 128/71 | HR 68 | Temp 97.2°F | Resp 20 | Ht 60.0 in | Wt 86.0 lb

## 2019-06-11 DIAGNOSIS — E876 Hypokalemia: Secondary | ICD-10-CM

## 2019-06-11 DIAGNOSIS — C182 Malignant neoplasm of ascending colon: Secondary | ICD-10-CM

## 2019-06-11 DIAGNOSIS — C7951 Secondary malignant neoplasm of bone: Secondary | ICD-10-CM

## 2019-06-11 DIAGNOSIS — C772 Secondary and unspecified malignant neoplasm of intra-abdominal lymph nodes: Secondary | ICD-10-CM | POA: Diagnosis not present

## 2019-06-11 DIAGNOSIS — C180 Malignant neoplasm of cecum: Secondary | ICD-10-CM | POA: Diagnosis not present

## 2019-06-11 LAB — CBC WITH DIFFERENTIAL/PLATELET
Abs Immature Granulocytes: 0.03 10*3/uL (ref 0.00–0.07)
Basophils Absolute: 0 10*3/uL (ref 0.0–0.1)
Basophils Relative: 0 %
Eosinophils Absolute: 0 10*3/uL (ref 0.0–0.5)
Eosinophils Relative: 1 %
HCT: 29 % — ABNORMAL LOW (ref 36.0–46.0)
Hemoglobin: 8.8 g/dL — ABNORMAL LOW (ref 12.0–15.0)
Immature Granulocytes: 0 %
Lymphocytes Relative: 12 %
Lymphs Abs: 1 10*3/uL (ref 0.7–4.0)
MCH: 26.7 pg (ref 26.0–34.0)
MCHC: 30.3 g/dL (ref 30.0–36.0)
MCV: 88.1 fL (ref 80.0–100.0)
Monocytes Absolute: 1 10*3/uL (ref 0.1–1.0)
Monocytes Relative: 13 %
Neutro Abs: 6.2 10*3/uL (ref 1.7–7.7)
Neutrophils Relative %: 74 %
Platelets: 117 10*3/uL — ABNORMAL LOW (ref 150–400)
RBC: 3.29 MIL/uL — ABNORMAL LOW (ref 3.87–5.11)
RDW: 19.2 % — ABNORMAL HIGH (ref 11.5–15.5)
WBC: 8.3 10*3/uL (ref 4.0–10.5)
nRBC: 0 % (ref 0.0–0.2)

## 2019-06-11 LAB — COMPREHENSIVE METABOLIC PANEL
ALT: 12 U/L (ref 0–44)
AST: 16 U/L (ref 15–41)
Albumin: 2.5 g/dL — ABNORMAL LOW (ref 3.5–5.0)
Alkaline Phosphatase: 205 U/L — ABNORMAL HIGH (ref 38–126)
Anion gap: 10 (ref 5–15)
BUN: 7 mg/dL (ref 6–20)
CO2: 27 mmol/L (ref 22–32)
Calcium: 8.3 mg/dL — ABNORMAL LOW (ref 8.9–10.3)
Chloride: 103 mmol/L (ref 98–111)
Creatinine, Ser: 0.57 mg/dL (ref 0.44–1.00)
GFR calc Af Amer: >60 mL/min (ref >60)
GFR calc non Af Amer: >60 mL/min (ref >60)
Glucose, Bld: 118 mg/dL — ABNORMAL HIGH (ref 70–99)
Potassium: 2.7 mmol/L — CL (ref 3.5–5.1)
Sodium: 140 mmol/L (ref 135–145)
Total Bilirubin: 0.9 mg/dL (ref 0.3–1.2)
Total Protein: 6.1 g/dL — ABNORMAL LOW (ref 6.5–8.1)

## 2019-06-11 LAB — MAGNESIUM: Magnesium: 1.7 mg/dL (ref 1.7–2.4)

## 2019-06-11 MED ORDER — SODIUM CHLORIDE 0.9% FLUSH
10.0000 mL | Freq: Once | INTRAVENOUS | Status: AC
  Administered 2019-06-11: 10:00:00 10 mL via INTRAVENOUS
  Filled 2019-06-11: qty 10

## 2019-06-11 MED ORDER — POTASSIUM CHLORIDE CRYS ER 20 MEQ PO TBCR: 20.0000 meq | EXTENDED_RELEASE_TABLET | Freq: Three times a day (TID) | ORAL | 1 refills | Status: AC

## 2019-06-11 MED ORDER — SODIUM CHLORIDE 0.9 % IV SOLN
Freq: Once | INTRAVENOUS | Status: AC
  Filled 2019-06-11: qty 250

## 2019-06-11 MED ORDER — HEPARIN SOD (PORK) LOCK FLUSH 100 UNIT/ML IV SOLN
INTRAVENOUS | Status: AC
  Filled 2019-06-11: qty 5

## 2019-06-11 MED ORDER — HEPARIN SOD (PORK) LOCK FLUSH 100 UNIT/ML IV SOLN
500.0000 [IU] | Freq: Once | INTRAVENOUS | Status: AC
  Administered 2019-06-11: 500 [IU] via INTRAVENOUS
  Filled 2019-06-11: qty 5

## 2019-06-11 MED ORDER — POTASSIUM CHLORIDE 20 MEQ/100ML IV SOLN
20.0000 meq | Freq: Once | INTRAVENOUS | Status: AC
  Administered 2019-06-11: 11:00:00 20 meq via INTRAVENOUS

## 2019-06-11 MED ORDER — HEPARIN SOD (PORK) LOCK FLUSH 100 UNIT/ML IV SOLN
500.0000 [IU] | Freq: Once | INTRAVENOUS | Status: DC | PRN
  Filled 2019-06-11: qty 5

## 2019-06-11 MED ORDER — HYDROCODONE-ACETAMINOPHEN 5-325 MG PO TABS: 1.0000 | ORAL_TABLET | Freq: Three times a day (TID) | ORAL | 0 refills | Status: DC | PRN

## 2019-06-11 MED ORDER — SODIUM CHLORIDE 0.9 % IV SOLN
240.0000 mg | Freq: Once | INTRAVENOUS | Status: AC
  Administered 2019-06-11: 12:00:00 240 mg via INTRAVENOUS
  Filled 2019-06-11: qty 24

## 2019-06-11 NOTE — Progress Notes (Signed)
[place] OFFICE PROGRESS NOTE  Patient Care Team: Patient, No Pcp Per as PCP - General (General Practice)  Cancer Staging No matching staging information was found for the patient.   Oncology History Overview Note  # [facility] 2017- COLON CANCER- STAGE IV [s/p right hemi-colectomy; pT4apN2 (7/19LN)]; Pre-op CEA- [id number]  PET-  mesenteric adenopathy/mediastinal adenopathy/right-sided subclavicular lymph node.    April 17th -START FOLFOX; May 1st Add Avastin;   Aug 16th- Disc OX [sec PN]; con 5FU-Avastin; NOV 27th CT C/A/P- CR; except for 74m LUL; 5FU-Avastin q 3 W [Poor tolerance to FOLFOX].  # June/July 2019- Progression of Liver/Abd LN; July 2019- START FOLFIRI + Avastin; STOP iri 02/25/2018- sec to diarrhea; cont Avastin + 5FU  # March 2018- L2 uptake MET s/p RT [[facility]2018]; mid April X-geva  # L1 ablation/ostecool-  Jan 3rd 2020-   # march 2nd 2020- decrease 5FU CIV  to 20061mm2/  #June 17th, 2020-liver ablation; 3.5 cm lesion.  Intolerance to 5-FU plus Avastin.  Discontinued; Side effects/weight loss/ quality of life  #July 13th -Lonsurf cycle #1; x2 cycles; September 2020-progressive disease in the mesenteric region; liver disease stable;  #March 05, 2019-5-FU plus Avastin [peripheral neuropathy/diarrhea]; DEC 2020- Progression,  # JAN 2021- OPDIVO + REGO  # July 2nd CT scan-right laryngeal nerve palsy/hoarseness of voice [Dr. McQueen] CT-chest NEG; MRI brain negative.  # MOLECULAR TESTING: K-ras-EXON 2- MUTATED; MSI- STABLE.   # palliative care- [name].   DIAGNOSIS: colon ca  STAGE:  IV ;GOALS: pallaitive  CURRENT/MOST RECENT THERAPY: Opdivo [C]+ Regorefenib.     Cancer of ascending colon metastatic to intra-abdominal lymph node ([place] 05/28/2019 -  Chemotherapy   The patient had nivolumab (OPDIVO) 240 mg in sodium chloride 0.9 % 100 mL chemo infusion, 240 mg, Intravenous, Once, 2 of 6 cycles Administration: 240 mg (05/28/2019)  for  chemotherapy treatment.      INTERVAL HISTORY:  Nancy PRINGLE89.o.  female pleasant patient above history of metastatic adenocarcinoma of the colon-currently on OPDIVO + REGO [not started yet].  Patient appetite is fair.  Weight is stable.  Not gaining any weight.  Chronic mild diarrhea not worse.  Continues abdominal pain needing hydrocodone/continues have fentanyl patch.  Patient states that she has not received the potassium refill from her mail-in pharmacy.  Unfortunately should not [place] any short-term prescription.  Muscle aches joint pains.  No heart palpitations.  No syncopal episodes.  Review of Systems  Constitutional: Positive for malaise/fatigue. Negative for chills, diaphoresis and fever.  HENT: Negative for nosebleeds and sore throat.   Eyes: Negative for double vision.  Respiratory: Negative for cough, hemoptysis, sputum production, shortness of breath and wheezing.   Cardiovascular: Negative for chest pain, palpitations, orthopnea and leg swelling.  Gastrointestinal: Positive for abdominal pain and nausea. Negative for constipation, heartburn, melena and vomiting.  Genitourinary: Negative for dysuria, frequency and urgency.  Musculoskeletal: Positive for back pain. Negative for joint pain.  Skin: Negative.  Negative for itching and rash.  Neurological: Positive for tingling. Negative for dizziness, focal weakness, weakness and headaches.  Endo/Heme/Allergies: Does not bruise/bleed easily.  Psychiatric/Behavioral: Negative for depression. The patient is not nervous/anxious and does not have insomnia.       PAST MEDICAL HISTORY :  Past Medical History:  Diagnosis Date  . Chicken pox   . Colon cancer ([place]   Partial colectomy 07/2015 + chemo tx's  . Hypertension   . Hypokalemia   .  Menopause    > 5 yrs  . Peripheral neuropathy due to chemotherapy ([place])    bi lat feet    PAST SURGICAL HISTORY :   Past Surgical History:  Procedure Laterality Date   . BILIARY STENT PLACEMENT  05/05/2019   Procedure: BILIARY STENT PLACEMENT;  Surgeon: Ronnette Juniper, MD;  Location: [place];  Service: Gastroenterology;;  . BREAST BIOPSY Right    2007? pt unsure done by [facility]  . COLONOSCOPY    . COLONOSCOPY WITH PROPOFOL N/A 10/14/2015   Procedure: COLONOSCOPY WITH PROPOFOL;  Surgeon: Hulen Luster, MD;  Location[facility] ENDOSCOPY;  Service: Endoscopy;  Laterality: N/A;  . COLOSTOMY REVISION Right 08/09/2015   Procedure: COLON RESECTION RIGHT;  Surgeon: Florene Glen, MD;  Location: [facility] ORS;  Service: General;  Laterality: Right;  . ECTOPIC PREGNANCY SURGERY    . ERCP N/A 05/05/2019   Procedure: ENDOSCOPIC RETROGRADE CHOLANGIOPANCREATOGRAPHY (ERCP);  Surgeon: Ronnette Juniper, MD;  Location: [place];  Service: Gastroenterology;  Laterality: N/A;  . IR BONE TUMOR(S)RF ABLATION  05/24/2018  . IR KYPHO LUMBAR INC FX REDUCE BONE BX UNI/BIL CANNULATION INC/IMAGING  05/24/2018  . IR RADIOLOGIST EVAL & MGMT  05/08/2018  . IR RADIOLOGIST EVAL & MGMT  06/18/2018  . IR RADIOLOGIST EVAL & MGMT  07/31/2018  . IR RADIOLOGIST EVAL & MGMT  12/19/2018  . IR RADIOLOGIST EVAL & MGMT  02/27/2019  . PANCREATIC STENT PLACEMENT  05/05/2019   Procedure: PANCREATIC STENT PLACEMENT;  Surgeon: Ronnette Juniper, MD;  [facility] ENDOSCOPY;  Service: Gastroenterology;;  . Sol Passer PLACEMENT N/A 09/01/2015   Procedure: INSERTION PORT-A-CATH;  Surgeon: Florene Glen, MD;  Location: [facility] ORS;  Service: General;  Laterality: N/A;  . RADIOLOGY WITH ANESTHESIA N/A 11/06/2018   Procedure: CT-MICROWAVE THERMAL ABLATION/LIVER;  Surgeon: Corrie Mckusick, DO;  Location: [facility] ORS;  Service: Anesthesiology;  Laterality: N/A;  . REMOVAL OF STONES  05/05/2019   Procedure: REMOVAL OF STONES;  Surgeon: Ronnette Juniper, MD;  Location: [place];  Service: Gastroenterology;;  . SHOULDER SURGERY Left   . SPHINCTEROTOMY  05/05/2019   Procedure: SPHINCTEROTOMY;  Surgeon: Ronnette Juniper, MD;  Location: [facility] ENDOSCOPY;   Service: Gastroenterology;;    FAMILY HISTORY :   Family History  Problem Relation Age of Onset  . Hypertension Mother   . Arthritis Father   . Prostate cancer Father   . Diabetes Maternal Grandmother   . Colon cancer Maternal Grandfather        colon    SOCIAL HISTORY:   Social History   Tobacco Use  . Smoking status: Former Smoker    Packs/day: 0.25    Years: 2.00    Pack years: 0.50  . Smokeless tobacco: Never Used  . Tobacco comment: recently quit  Substance Use Topics  . Alcohol use: No    Alcohol/week: 0.0 standard drinks  . Drug use: No    ALLERGIES:  has No Known Allergies.  MEDICATIONS:  Current Outpatient Medications  Medication Sig Dispense Refill  . acetaminophen (TYLENOL) 325 MG tablet Take 2 tablets (650 mg total) by mouth every 6 (six) hours as needed for mild pain (or Fever >/= 101).    Marland Kitchen amLODipine (NORVASC) 5 MG tablet Take 2 tablets (10 mg total) by mouth daily. 30 tablet 6  . Calcium Carbonate-Vitamin D (CALCIUM 600/VITAMIN D PO) Take 1 tablet by mouth every evening.     . cloNIDine (CATAPRES - DOSED IN MG/24 HR) 0.1 mg/24hr patch Place 1 patch (0.1 mg total)  onto the skin every Monday at 6 PM. 4 patch 12  . diphenoxylate-atropine (LOMOTIL) 2.5-0.025 MG tablet Take 1 tablet by mouth 4 (four) times daily as needed for diarrhea or loose stools. Take it along with immodium 60 tablet 4  . dronabinol (MARINOL) 5 MG capsule Take 1 capsule (5 mg total) by mouth 2 (two) times daily before lunch and supper. 60 capsule 4  . DULoxetine (CYMBALTA) 20 MG capsule TAKE 2 CAPSULES DAILY 180 capsule 3  . feeding supplement (BOOST HIGH PROTEIN) LIQD Take 1 Container by mouth daily.     . fentaNYL (DURAGESIC) 50 MCG/HR Place 1 patch onto the skin every 3 (three) days. 10 patch 0  . ferrous sulfate (FERROUSUL) 325 (65 FE) MG tablet Take 1 tablet (325 mg total) by mouth daily with breakfast. 90 tablet 3  . gabapentin (NEURONTIN) 300 MG capsule Take 1-2 capsules (300-600 mg  total) by mouth See admin instructions. Take 1 capsule (300 mg) by mouth daily in the morning & take 2 capsules (600 mg) by mouth at night. 90 capsule 6  . HYDROcodone-acetaminophen (NORCO/VICODIN) 5-325 MG tablet Take 1 tablet by mouth every 8 (eight) hours as needed (pain.). 90 tablet 0  . lidocaine-prilocaine (EMLA) cream Apply 1 application topically as needed. Apply generously over the Mediport 45 minutes prior to chemotherapy. (Patient taking differently: Apply 1 application topically as needed (prior to chemotherapy.). ) 30 g 6  . loperamide (IMODIUM) 2 MG capsule Take 2 mg by mouth 4 (four) times daily as needed for diarrhea or loose stools.     . Multiple Vitamin (MULTIVITAMIN WITH MINERALS) TABS tablet Take 1 tablet by mouth daily.    . ondansetron (ZOFRAN) 8 MG tablet One pill every 8 hours as needed for nausea/vomitting. (Patient taking differently: Take 8 mg by mouth every 8 (eight) hours as needed for nausea or vomiting. ) 60 tablet 0  . potassium chloride SA (KLOR-CON) 20 MEQ tablet Take 1 tablet (20 mEq total) by mouth 3 (three) times daily. 90 tablet 1  . prochlorperazine (COMPAZINE) 10 MG tablet Take 1 tablet (10 mg total) by mouth every 6 (six) hours as needed for nausea or vomiting. 90 tablet 3  . regorafenib (STIVARGA) 40 MG tablet Take 2 tablets (80 mg total) by mouth daily with breakfast. Take for 3 weeks; and 1 week OFF. Take with low fat meal. DO NOT START UNTIL OK by MD. 42 tablet 0   No current facility-administered medications for this visit.   Facility-Administered Medications Ordered in Other Visits  Medication Dose Route Frequency Provider Last Rate Last Admin  . heparin lock flush 100 unit/mL  500 Units Intravenous Once Charlaine Dalton R, MD      . heparin lock flush 100 unit/mL  500 Units Intracatheter Once PRN Cammie Sickle, MD      . nivolumab (OPDIVO) 240 mg in sodium chloride 0.9 % 100 mL chemo infusion  240 mg Intravenous Once Cammie Sickle,  MD 248 mL/hr at 06/11/19 1202 240 mg at 06/11/19 1202  . sodium chloride flush (NS) 0.9 % injection 10 mL  10 mL Intravenous PRN Cammie Sickle, MD   10 mL at 10/04/15 0901  . sodium chloride flush (NS) 0.9 % injection 10 mL  10 mL Intravenous PRN Charlaine Dalton R, MD   10 mL at 07/02/17 1020    PHYSICAL EXAMINATION: ECOG PERFORMANCE STATUS: 1 - Symptomatic but completely ambulatory  BP 128/71   Pulse 68   Temp (!)  97.2 F (36.2 C) (Tympanic)   Resp 20   Ht 5' (1.524 Nancy)   Wt 86 lb (39 kg)   LMP  (LMP Unknown) Comment: LMP MORE THAN 5 YRS  BMI 16.80 kg/Nancy   Filed Weights   06/11/19 0953  Weight: 86 lb (39 kg)    Physical Exam  Constitutional: She is oriented to person, place, and time.  She is alone. She is walking herself.  HENT:  Head: Normocephalic and atraumatic.  Mouth/Throat: Oropharynx is clear and moist. No oropharyngeal exudate.  Eyes: Pupils are equal, round, and reactive to light.  Cardiovascular: Normal rate and regular rhythm.  Pulmonary/Chest: Effort normal and breath sounds normal. No respiratory distress. She has no wheezes.  Abdominal: Soft. Bowel sounds are normal. She exhibits no distension and no mass. There is no abdominal tenderness. There is no rebound and no guarding.  Musculoskeletal:        General: No tenderness or edema. Normal range of motion.     Cervical back: Normal range of motion and neck supple.  Neurological: She is alert and oriented to person, place, and time.  Skin: Skin is warm.  Psychiatric: Affect normal.   LABORATORY DATA:  I have reviewed the data as listed    Component Value Date/Time   NA 140 06/11/2019 0924   K 2.7 (LL) 06/11/2019 0924   CL 103 06/11/2019 0924   CO2 27 06/11/2019 0924   GLUCOSE 118 (H) 06/11/2019 0924   BUN 7 06/11/2019 0924   CREATININE 0.57 06/11/2019 0924   CALCIUM 8.3 (L) 06/11/2019 0924   PROT 6.1 (L) 06/11/2019 0924   ALBUMIN 2.5 (L) 06/11/2019 0924   AST 16 06/11/2019 0924   ALT 12  06/11/2019 0924   ALKPHOS 205 (H) 06/11/2019 0924   BILITOT 0.9 06/11/2019 0924   GFRNONAA >60 06/11/2019 0924   GFRAA >60 06/11/2019 0924    No results found for: SPEP, UPEP  Lab Results  Component Value Date   WBC 8.3 06/11/2019   NEUTROABS 6.2 06/11/2019   HGB 8.8 (L) 06/11/2019   HCT 29.0 (L) 06/11/2019   MCV 88.1 06/11/2019   PLT 117 (L) 06/11/2019      Chemistry      Component Value Date/Time   NA 140 06/11/2019 0924   K 2.7 (LL) 06/11/2019 0924   CL 103 06/11/2019 0924   CO2 27 06/11/2019 0924   BUN 7 06/11/2019 0924   CREATININE 0.57 06/11/2019 0924      Component Value Date/Time   CALCIUM 8.3 (L) 06/11/2019 0924   ALKPHOS 205 (H) 06/11/2019 0924   AST 16 06/11/2019 0924   ALT 12 06/11/2019 0924   BILITOT 0.9 06/11/2019 0924       RADIOGRAPHIC STUDIES: I have personally reviewed the radiological images as listed and agreed with the findings in the report. No results found.   ASSESSMENT & PLAN:  Cancer of ascending colon metastatic to intra-abdominal lymph node [facility]) # Colon cancer- cecal/ right-sided; STAGE IV;  December 11th 2020-CT scan-gallbladder distention; worsening disease in the liver-up to 3 lesions new [13 to 14 mm in size].  Currently on opdivo+Stivarga.proceed with starting Stivarga cycle #1 today.   #  Proceed with Opdivo; Labs today reviewed;  acceptable for treatment today.   #Chronic diarrhea-1-2 loose stools/ day; continue Lomotil-refilled; continue imodium;- STABLE.   # Abdominal discomfort/epigastric/ back pain -Sec to malignancy; fentany/ hydocodone TID.  Stable; refilled hydrocodone today.   #Hypertension- Currently on Norvasc 5 mg blood pressure-stable  systolic.  Recommend compliance of blood pressure medications. Monitor closley.   # hypokalemia-2.7;  Not on supp/ out of pills.  Recommend kdur 20 meq TID/new script sent.  Also recommend KCl 20 mEq today; again next week; discussed compliance.   I spoke at length with the  patient's mother- regarding the patient's clinical status/plan of care.  Family agreement.  Given concerns for compliance recommend mother to accompany patient at subsequent visits.  # DISPOSITION: # IV KCL 20 meq today; OPDIVO today # 1 week- labs- bmp/mag; possible IV KCL # follow up in 2 weeks; labs;MD-  Cbc/cmp/magCEA;KCL 26mq IV; Opdivo-Dr.B        Orders Placed This Encounter  Procedures  . Basic metabolic panel    Standing Status:   Future    Standing Expiration Date:   06/10/2020  . Magnesium    Standing Status:   Future    Standing Expiration Date:   06/10/2020  . CBC with Differential    Standing Status:   Future    Standing Expiration Date:   06/10/2020  . Comprehensive metabolic panel    Standing Status:   Future    Standing Expiration Date:   06/10/2020  . Magnesium    Standing Status:   Future    Standing Expiration Date:   06/10/2020  . CEA    Standing Status:   Future    Standing Expiration Date:   06/10/2020   All questions were answered. The patient knows to call the clinic with any problems, questions or concerns.      GCammie Sickle MD 06/11/2019 12:21 PM

## 2019-06-11 NOTE — Assessment & Plan Note (Addendum)
#   Colon cancer- cecal/ right-sided; STAGE IV;  December 11th 2020-CT scan-gallbladder distention; worsening disease in the liver-up to 3 lesions new [13 to 14 mm in size].  Currently on opdivo+Stivarga.proceed with starting Stivarga cycle #1 today.   #  Proceed with Opdivo; Labs today reviewed;  acceptable for treatment today.   #Chronic diarrhea-1-2 loose stools/ day; continue Lomotil-refilled; continue imodium;- STABLE.   # Abdominal discomfort/epigastric/ back pain -Sec to malignancy; fentany/ hydocodone TID.  Stable; refilled hydrocodone today.   #Hypertension- Currently on Norvasc 5 mg blood pressure-stable systolic.  Recommend compliance of blood pressure medications. Monitor closley.   # hypokalemia-2.7;  Not on supp/ out of pills.  Recommend kdur 20 meq TID/new script sent.  Also recommend KCl 20 mEq today; again next week; discussed compliance.   I spoke at length with the patient's mother- regarding the patient's clinical status/plan of care.  Family agreement.  Given concerns for compliance recommend mother to accompany patient at subsequent visits.  # DISPOSITION: # IV KCL 20 meq today; OPDIVO today # 1 week- labs- bmp/mag; possible IV KCL # follow up in 2 weeks; labs;MD-  Cbc/cmp/magCEA;KCL 58meq IV; Opdivo-Dr.B

## 2019-06-11 NOTE — Progress Notes (Signed)
Patient has been completely out of her potassium since Saturday. Has not received the mail order script for potassium. Pt did not oncall any provider this weekend to inform our office that she was completely out of the potassium.   Critical potassium 2.7.  MD ordering 20 meq potassium via port a cath. Ok - to infuse potassium over 1 hour via port per v/o Dr. Rogue Bussing

## 2019-06-12 ENCOUNTER — Telehealth: Payer: Self-pay | Admitting: *Deleted

## 2019-06-12 NOTE — Telephone Encounter (Signed)
Patient and her mother were educated to the following:  Start potassium asap. Patient has been without her potassium supplement since Saturday. Pt stated at the time of the visit that she did not have any potassium supplements. Scripts were mailed to mail order, but not yet delivered to the patient by [employer]. Discussed with patient that a new script was sent to her local pharmacy to start on potassium tablets. Dr. Jacinto Reap and myself advised the patient and her mother that patient's potassium was critical. With a critical potassium, patient could have severe cardiac side effects. MD also Discussed the importance of patient continuation of her blood pressure medications. Patient and her mother was instructed to have patient start her oral chemo - Stivarga asap - yesterday.  Hassan Rowan, Please contact patient and her mother to reiterate this plan above. Please make sure patient is taking her oral potassium! Dr. Rogue Bussing strongly recommends that pt's mother attend the next apt with the patient.

## 2019-06-12 NOTE — Telephone Encounter (Signed)
Mother called stating she thought she was told to stop patient bp medicine yesterday and said something about Stivarga. Per office note yesterday, patient was to start Stivarga yesterday and she she she couldn't tell if she was told to start or stop it. Please return her call to clarify this with her. [phone]

## 2019-06-12 NOTE — Telephone Encounter (Signed)
Call returned to Nancy Clark and reiterated to her that pt is to be taking potassiums as her level is critically low, she states that she dis start it yesterday, Also advised that she needs to be taking her bp med and she states she will make sure that she does take it and also discussed that patient was to have started her Stivarga yesterday and that if she did not, be sure that she starts it today. She stated that she will have her start it today.

## 2019-06-13 LAB — CEA: CEA: 57.2 ng/mL — ABNORMAL HIGH (ref 0.0–4.7)

## 2019-06-18 ENCOUNTER — Other Ambulatory Visit: Payer: Self-pay

## 2019-06-19 ENCOUNTER — Inpatient Hospital Stay: Payer: BC Managed Care – PPO

## 2019-06-19 ENCOUNTER — Other Ambulatory Visit: Payer: Self-pay

## 2019-06-19 ENCOUNTER — Inpatient Hospital Stay: Payer: BC Managed Care – PPO | Admitting: *Deleted

## 2019-06-19 VITALS — BP 122/71 | HR 64 | Temp 98.5°F | Resp 18

## 2019-06-19 DIAGNOSIS — E876 Hypokalemia: Secondary | ICD-10-CM

## 2019-06-19 DIAGNOSIS — C180 Malignant neoplasm of cecum: Secondary | ICD-10-CM | POA: Diagnosis not present

## 2019-06-19 DIAGNOSIS — C772 Secondary and unspecified malignant neoplasm of intra-abdominal lymph nodes: Secondary | ICD-10-CM

## 2019-06-19 DIAGNOSIS — Z95828 Presence of other vascular implants and grafts: Secondary | ICD-10-CM

## 2019-06-19 DIAGNOSIS — C182 Malignant neoplasm of ascending colon: Secondary | ICD-10-CM

## 2019-06-19 DIAGNOSIS — C7951 Secondary malignant neoplasm of bone: Secondary | ICD-10-CM

## 2019-06-19 LAB — BASIC METABOLIC PANEL
Anion gap: 9 (ref 5–15)
BUN: 8 mg/dL (ref 6–20)
CO2: 23 mmol/L (ref 22–32)
Calcium: 8 mg/dL — ABNORMAL LOW (ref 8.9–10.3)
Chloride: 104 mmol/L (ref 98–111)
Creatinine, Ser: 0.57 mg/dL (ref 0.44–1.00)
GFR calc Af Amer: >60 mL/min (ref >60)
GFR calc non Af Amer: >60 mL/min (ref >60)
Glucose, Bld: 134 mg/dL — ABNORMAL HIGH (ref 70–99)
Potassium: 2.8 mmol/L — ABNORMAL LOW (ref 3.5–5.1)
Sodium: 136 mmol/L (ref 135–145)

## 2019-06-19 LAB — MAGNESIUM: Magnesium: 1.6 mg/dL — ABNORMAL LOW (ref 1.7–2.4)

## 2019-06-19 MED ORDER — SODIUM CHLORIDE 0.9 % IV SOLN
Freq: Once | INTRAVENOUS | Status: AC
  Filled 2019-06-19: qty 250

## 2019-06-19 MED ORDER — HEPARIN SOD (PORK) LOCK FLUSH 100 UNIT/ML IV SOLN
INTRAVENOUS | Status: AC
  Filled 2019-06-19: qty 5

## 2019-06-19 MED ORDER — POTASSIUM CHLORIDE 20 MEQ/100ML IV SOLN
20.0000 meq | Freq: Once | INTRAVENOUS | Status: AC
  Administered 2019-06-19: 20 meq via INTRAVENOUS

## 2019-06-19 MED ORDER — HEPARIN SOD (PORK) LOCK FLUSH 100 UNIT/ML IV SOLN
500.0000 [IU] | Freq: Once | INTRAVENOUS | Status: AC
  Administered 2019-06-19: 500 [IU] via INTRAVENOUS
  Filled 2019-06-19: qty 5

## 2019-06-19 MED ORDER — SODIUM CHLORIDE 0.9% FLUSH
10.0000 mL | Freq: Once | INTRAVENOUS | Status: AC
  Administered 2019-06-19: 10 mL via INTRAVENOUS
  Filled 2019-06-19: qty 10

## 2019-06-19 NOTE — Progress Notes (Signed)
Potassium: 2.8, Magnesium: 1.6. MD, Dr. Rogue Bussing, notified and aware. Per MD order: No Magnesium today; release order from supportive therapy plan for Potassium Chloride 20 mEq in 100 ml IVPB over 1 hour and administer at this time.

## 2019-06-24 ENCOUNTER — Other Ambulatory Visit: Payer: Self-pay

## 2019-06-24 NOTE — Progress Notes (Signed)
Patient pre screened for office appointment, no questions or concerns today. Patient reminded of upcoming appointment time and date. 

## 2019-06-25 ENCOUNTER — Other Ambulatory Visit: Payer: Self-pay

## 2019-06-25 ENCOUNTER — Inpatient Hospital Stay (HOSPITAL_BASED_OUTPATIENT_CLINIC_OR_DEPARTMENT_OTHER): Payer: BC Managed Care – PPO | Admitting: Internal Medicine

## 2019-06-25 ENCOUNTER — Inpatient Hospital Stay: Payer: BC Managed Care – PPO | Attending: Internal Medicine

## 2019-06-25 ENCOUNTER — Inpatient Hospital Stay: Payer: BC Managed Care – PPO

## 2019-06-25 ENCOUNTER — Inpatient Hospital Stay: Payer: BC Managed Care – PPO | Admitting: Hospice and Palliative Medicine

## 2019-06-25 DIAGNOSIS — E876 Hypokalemia: Secondary | ICD-10-CM | POA: Diagnosis present

## 2019-06-25 DIAGNOSIS — C772 Secondary and unspecified malignant neoplasm of intra-abdominal lymph nodes: Secondary | ICD-10-CM | POA: Diagnosis not present

## 2019-06-25 DIAGNOSIS — C180 Malignant neoplasm of cecum: Secondary | ICD-10-CM | POA: Diagnosis present

## 2019-06-25 DIAGNOSIS — K529 Noninfective gastroenteritis and colitis, unspecified: Secondary | ICD-10-CM | POA: Insufficient documentation

## 2019-06-25 DIAGNOSIS — C182 Malignant neoplasm of ascending colon: Secondary | ICD-10-CM

## 2019-06-25 DIAGNOSIS — C787 Secondary malignant neoplasm of liver and intrahepatic bile duct: Secondary | ICD-10-CM | POA: Diagnosis not present

## 2019-06-25 DIAGNOSIS — Z95828 Presence of other vascular implants and grafts: Secondary | ICD-10-CM

## 2019-06-25 LAB — CBC WITH DIFFERENTIAL/PLATELET
Abs Immature Granulocytes: 0.1 10*3/uL — ABNORMAL HIGH (ref 0.00–0.07)
Basophils Absolute: 0 10*3/uL (ref 0.0–0.1)
Basophils Relative: 0 %
Eosinophils Absolute: 0 10*3/uL (ref 0.0–0.5)
Eosinophils Relative: 0 %
HCT: 31.5 % — ABNORMAL LOW (ref 36.0–46.0)
Hemoglobin: 9.6 g/dL — ABNORMAL LOW (ref 12.0–15.0)
Immature Granulocytes: 1 %
Lymphocytes Relative: 6 %
Lymphs Abs: 0.7 10*3/uL (ref 0.7–4.0)
MCH: 25.5 pg — ABNORMAL LOW (ref 26.0–34.0)
MCHC: 30.5 g/dL (ref 30.0–36.0)
MCV: 83.8 fL (ref 80.0–100.0)
Monocytes Absolute: 1.2 10*3/uL — ABNORMAL HIGH (ref 0.1–1.0)
Monocytes Relative: 10 %
Neutro Abs: 9.2 10*3/uL — ABNORMAL HIGH (ref 1.7–7.7)
Neutrophils Relative %: 83 %
Platelets: 108 10*3/uL — ABNORMAL LOW (ref 150–400)
RBC: 3.76 MIL/uL — ABNORMAL LOW (ref 3.87–5.11)
RDW: 19.3 % — ABNORMAL HIGH (ref 11.5–15.5)
WBC: 11.2 10*3/uL — ABNORMAL HIGH (ref 4.0–10.5)
nRBC: 0 % (ref 0.0–0.2)

## 2019-06-25 LAB — COMPREHENSIVE METABOLIC PANEL
ALT: 15 U/L (ref 0–44)
AST: 16 U/L (ref 15–41)
Albumin: 2.8 g/dL — ABNORMAL LOW (ref 3.5–5.0)
Alkaline Phosphatase: 233 U/L — ABNORMAL HIGH (ref 38–126)
Anion gap: 14 (ref 5–15)
BUN: 7 mg/dL (ref 6–20)
CO2: 23 mmol/L (ref 22–32)
Calcium: 8.3 mg/dL — ABNORMAL LOW (ref 8.9–10.3)
Chloride: 100 mmol/L (ref 98–111)
Creatinine, Ser: 0.74 mg/dL (ref 0.44–1.00)
GFR calc Af Amer: >60 mL/min (ref >60)
GFR calc non Af Amer: >60 mL/min (ref >60)
Glucose, Bld: 101 mg/dL — ABNORMAL HIGH (ref 70–99)
Potassium: 2.5 mmol/L — CL (ref 3.5–5.1)
Sodium: 137 mmol/L (ref 135–145)
Total Bilirubin: 1.2 mg/dL (ref 0.3–1.2)
Total Protein: 6.8 g/dL (ref 6.5–8.1)

## 2019-06-25 LAB — MAGNESIUM: Magnesium: 1.5 mg/dL — ABNORMAL LOW (ref 1.7–2.4)

## 2019-06-25 MED ORDER — SODIUM CHLORIDE 0.9 % IV SOLN
Freq: Once | INTRAVENOUS | Status: AC
  Filled 2019-06-25: qty 10

## 2019-06-25 MED ORDER — SODIUM CHLORIDE 0.9% FLUSH
10.0000 mL | INTRAVENOUS | Status: DC | PRN
  Administered 2019-06-25: 10 mL via INTRAVENOUS
  Filled 2019-06-25: qty 10

## 2019-06-25 MED ORDER — HEPARIN SOD (PORK) LOCK FLUSH 100 UNIT/ML IV SOLN
500.0000 [IU] | Freq: Once | INTRAVENOUS | Status: AC
  Administered 2019-06-25: 500 [IU] via INTRAVENOUS
  Filled 2019-06-25: qty 5

## 2019-06-25 NOTE — Assessment & Plan Note (Addendum)
#   Colon cancer- cecal/ right-sided; STAGE IV;  December 11th 2020-CT scan-gallbladder distention; worsening disease in the liver-up to 3 lesions new [13 to 14 mm in size].  Currently on opdivo+Stivarga.proceed with starting Stivarga cycle #1 today.   #Hold Opdivo-given ongoing fevers [see discussion below].  Had a long discussion with the patient/mother regarding the serious nature of her malignancy.  Based upon current CEA levels-is unlikely patient is respond to therapy.  However it too early to assess response.  I do not think her previous therapies would be helpful at this time.  #Fever-question etiology check Covid test.  #Chronic diarrhea-1-2 loose stools/ day; continue Lomotil-refilled; continue imodium;-stable  # Abdominal discomfort/epigastric/ back pain -Sec to malignancy; fentany/ hydocodone TID.  Stable; refilled hydrocodone today.   #Hypertension- Currently on Norvasc 5 mg blood pressure-stable.    # hypokalemia-2.7;-continue potassium supplementation.  IV supplementation today.  I spoke at length with the patient's mother- regarding the patient's clinical status/plan of care.  Also recommend follow-up with palliative care next week.  # DISPOSITION: # IV KCL 20 meq today;mag 1 gm; HOLD OPDIVO; # covid-testing today # josh -in 1week # follow up in 1 weeks; labs;MD-  Cbc/cmp/magCEA;KCL 12meq IV/mag 1 gm; Opdivo-Dr.B

## 2019-06-25 NOTE — Progress Notes (Signed)
[facility] OFFICE PROGRESS NOTE  Patient Care Team: Patient, No Pcp Per as PCP - General (General Practice)  Cancer Staging No matching staging information was found for the patient.   Oncology History Overview Note  # [facility] 2017- COLON CANCER- STAGE IV [s/p right hemi-colectomy; pT4apN2 (7/19LN)]; Pre-op CEA- [id number]  PET-  mesenteric adenopathy/mediastinal adenopathy/right-sided subclavicular lymph node.    April 17th -START FOLFOX; May 1st Add Avastin;   Aug 16th- Disc OX [sec PN]; con 5FU-Avastin; NOV 27th CT C/A/P- CR; except for 10m LUL; 5FU-Avastin q 3 W [Poor tolerance to FOLFOX].  # June/July 2019- Progression of Liver/Abd LN; July 2019- START FOLFIRI + Avastin; STOP iri 02/25/2018- sec to diarrhea; cont Avastin + 5FU  # March 2018- L2 uptake MET s/p RT [[facility]2018]; mid April X-geva  # L1 ablation/ostecool-  Jan 3rd 2020-   # march 2nd 2020- decrease 5FU CIV  to '2000mg'$ /m2/  #June 17th, 2020-liver ablation; 3.5 cm lesion.  Intolerance to 5-FU plus Avastin.  Discontinued; Side effects/weight loss/ quality of life  #July 13th -Lonsurf cycle #1; x2 cycles; September 2020-progressive disease in the mesenteric region; liver disease stable;  #March 05, 2019-5-FU plus Avastin [peripheral neuropathy/diarrhea]; DEC 2020- Progression,  # JAN 2021- OPDIVO + REGO  # July 2nd CT scan-right laryngeal nerve palsy/hoarseness of voice [Dr. McQueen] CT-chest NEG; MRI brain negative.  # MOLECULAR TESTING: K-ras-EXON 2- MUTATED; MSI- STABLE.   # palliative care- [name].   DIAGNOSIS: colon ca  STAGE:  IV ;GOALS: pallaitive  CURRENT/MOST RECENT THERAPY: Opdivo [C]+ Regorefenib.     Cancer of ascending colon metastatic to intra-abdominal lymph node ([place]  05/28/2019 -  Chemotherapy   The patient had nivolumab (OPDIVO) 240 mg in sodium chloride 0.9 % 100 mL chemo infusion, 240 mg, Intravenous, Once, 2 of 6 cycles Administration: 240 mg (05/28/2019), 240 mg  (06/11/2019)  for chemotherapy treatment.      INTERVAL HISTORY:  Nancy WEISMANN89y.o.  female pleasant patient above history of metastatic adenocarcinoma of the colon-currently on OPDIVO + REGO.  Patient denies any fevers at home.  Appetite is fair.  Positive weight loss.  Continues to chronic mild diarrhea.  Not any worse.  She continues to be on hydrocodone/fentanyl patch.  She states to be compliant with her potassium prescription.  Complains of fatigue.  No shortness of the cough.  However patient noted to have a temperature of 100.8 in the clinic.  She has been seen in the negative pressure room/downstairs.  Review of Systems  Constitutional: Positive for malaise/fatigue. Negative for chills, diaphoresis and fever.  HENT: Negative for nosebleeds and sore throat.   Eyes: Negative for double vision.  Respiratory: Negative for cough, hemoptysis, sputum production, shortness of breath and wheezing.   Cardiovascular: Negative for chest pain, palpitations, orthopnea and leg swelling.  Gastrointestinal: Positive for abdominal pain and nausea. Negative for constipation, heartburn, melena and vomiting.  Genitourinary: Negative for dysuria, frequency and urgency.  Musculoskeletal: Positive for back pain. Negative for joint pain.  Skin: Negative.  Negative for itching and rash.  Neurological: Positive for tingling. Negative for dizziness, focal weakness, weakness and headaches.  Endo/Heme/Allergies: Does not bruise/bleed easily.  Psychiatric/Behavioral: Negative for depression. The patient is not nervous/anxious and does not have insomnia.       PAST MEDICAL HISTORY :  Past Medical History:  Diagnosis Date  . Chicken pox   . Colon cancer ([place]    Partial colectomy 07/2015 + chemo tx's  .  Hypertension   . Hypokalemia   . Menopause    > 5 yrs  . Peripheral neuropathy due to chemotherapy ([place])    bi lat feet    PAST SURGICAL HISTORY :   Past Surgical History:  Procedure  Laterality Date  . BILIARY STENT PLACEMENT  05/05/2019   Procedure: BILIARY STENT PLACEMENT;  Surgeon: Ronnette Juniper, MD;  Location: [place];  Service: Gastroenterology;;  . BREAST BIOPSY Right    2007? pt unsure done by [facility]  . COLONOSCOPY    . COLONOSCOPY WITH PROPOFOL N/A 10/14/2015   Procedure: COLONOSCOPY WITH PROPOFOL;  Surgeon: Hulen Luster, MD;  Location: [facility] ENDOSCOPY;  Service: Endoscopy;  Laterality: N/A;  . COLOSTOMY REVISION Right 08/09/2015   Procedure: COLON RESECTION RIGHT;  Surgeon: Florene Glen, MD;  Location: [facility] ORS;  Service: General;  Laterality: Right;  . ECTOPIC PREGNANCY SURGERY    . ERCP N/A 05/05/2019   Procedure: ENDOSCOPIC RETROGRADE CHOLANGIOPANCREATOGRAPHY (ERCP);  Surgeon: Ronnette Juniper, MD;  Location: [place];  Service: Gastroenterology;  Laterality: N/A;  . IR BONE TUMOR(S)RF ABLATION  05/24/2018  . IR KYPHO LUMBAR INC FX REDUCE BONE BX UNI/BIL CANNULATION INC/IMAGING  05/24/2018  . IR RADIOLOGIST EVAL & MGMT  05/08/2018  . IR RADIOLOGIST EVAL & MGMT  06/18/2018  . IR RADIOLOGIST EVAL & MGMT  07/31/2018  . IR RADIOLOGIST EVAL & MGMT  12/19/2018  . IR RADIOLOGIST EVAL & MGMT  02/27/2019  . PANCREATIC STENT PLACEMENT  05/05/2019   Procedure: PANCREATIC STENT PLACEMENT;  Surgeon: Ronnette Juniper, MD;  Location: [facility] ENDOSCOPY;  Service: Gastroenterology;;  . Sol Passer PLACEMENT N/A 09/01/2015   Procedure: INSERTION PORT-A-CATH;  Surgeon: Florene Glen, MD;  Location: [facility] ORS;  Service: General;  Laterality: N/A;  . RADIOLOGY WITH ANESTHESIA N/A 11/06/2018   Procedure: CT-MICROWAVE THERMAL ABLATION/LIVER;  Surgeon: Corrie Mckusick, DO;  Location: [facility] ORS;  Service: Anesthesiology;  Laterality: N/A;  . REMOVAL OF STONES  05/05/2019   Procedure: REMOVAL OF STONES;  Surgeon: Ronnette Juniper, MD;  Location: [place];  Service: Gastroenterology;;  . SHOULDER SURGERY Left   . SPHINCTEROTOMY  05/05/2019   Procedure: SPHINCTEROTOMY;  Surgeon: Ronnette Juniper, MD;  Location: [facility]  ENDOSCOPY;  Service: Gastroenterology;;    FAMILY HISTORY :   Family History  Problem Relation Age of Onset  . Hypertension Mother   . Arthritis Father   . Prostate cancer Father   . Diabetes Maternal Grandmother   . Colon cancer Maternal Grandfather        colon    SOCIAL HISTORY:   Social History   Tobacco Use  . Smoking status: Former Smoker    Packs/day: 0.25    Years: 2.00    Pack years: 0.50  . Smokeless tobacco: Never Used  . Tobacco comment: recently quit  Substance Use Topics  . Alcohol use: No    Alcohol/week: 0.0 standard drinks  . Drug use: No    ALLERGIES:  has No Known Allergies.  MEDICATIONS:  Current Outpatient Medications  Medication Sig Dispense Refill  . acetaminophen (TYLENOL) 325 MG tablet Take 2 tablets (650 mg total) by mouth every 6 (six) hours as needed for mild pain (or Fever >/= 101).    Marland Kitchen amLODipine (NORVASC) 5 MG tablet Take 2 tablets (10 mg total) by mouth daily. 30 tablet 6  . Calcium Carbonate-Vitamin D (CALCIUM 600/VITAMIN D PO) Take 1 tablet by mouth every evening.     . cloNIDine (CATAPRES - DOSED IN MG/24 HR) 0.1  mg/24hr patch Place 1 patch (0.1 mg total) onto the skin every Monday at 6 PM. 4 patch 12  . diphenoxylate-atropine (LOMOTIL) 2.5-0.025 MG tablet Take 1 tablet by mouth 4 (four) times daily as needed for diarrhea or loose stools. Take it along with immodium 60 tablet 4  . DULoxetine (CYMBALTA) 20 MG capsule TAKE 2 CAPSULES DAILY 180 capsule 3  . feeding supplement (BOOST HIGH PROTEIN) LIQD Take 1 Container by mouth daily.     . fentaNYL (DURAGESIC) 50 MCG/HR Place 1 patch onto the skin every 3 (three) days. 10 patch 0  . gabapentin (NEURONTIN) 300 MG capsule Take 1-2 capsules (300-600 mg total) by mouth See admin instructions. Take 1 capsule (300 mg) by mouth daily in the morning & take 2 capsules (600 mg) by mouth at night. 90 capsule 6  . HYDROcodone-acetaminophen (NORCO/VICODIN) 5-325 MG tablet Take 1 tablet by mouth every 8  (eight) hours as needed (pain.). 90 tablet 0  . lidocaine-prilocaine (EMLA) cream Apply 1 application topically as needed. Apply generously over the Mediport 45 minutes prior to chemotherapy. (Patient taking differently: Apply 1 application topically as needed (prior to chemotherapy.). ) 30 g 6  . loperamide (IMODIUM) 2 MG capsule Take 2 mg by mouth 4 (four) times daily as needed for diarrhea or loose stools.     . Multiple Vitamin (MULTIVITAMIN WITH MINERALS) TABS tablet Take 1 tablet by mouth daily.    . ondansetron (ZOFRAN) 8 MG tablet One pill every 8 hours as needed for nausea/vomitting. (Patient taking differently: Take 8 mg by mouth every 8 (eight) hours as needed for nausea or vomiting. ) 60 tablet 0  . potassium chloride SA (KLOR-CON) 20 MEQ tablet Take 1 tablet (20 mEq total) by mouth 3 (three) times daily. 90 tablet 1  . prochlorperazine (COMPAZINE) 10 MG tablet Take 1 tablet (10 mg total) by mouth every 6 (six) hours as needed for nausea or vomiting. 90 tablet 3  . STIVARGA 40 MG tablet TAKE 2 TABLETS (80 MG TOTAL) BY MOUTH DAILY WITH BREAKFAST. TAKE FOR 3 WEEKS; AND 1 WEEK OFF. TAKE WITH LOW FAT MEAL. DO NOT START UNTIL OK BY MD. 56 tablet 0  . dronabinol (MARINOL) 5 MG capsule Take 1 capsule (5 mg total) by mouth 2 (two) times daily before lunch and supper. (Patient not taking: Reported on 06/25/2019) 60 capsule 4  . ferrous sulfate (FERROUSUL) 325 (65 FE) MG tablet Take 1 tablet (325 mg total) by mouth daily with breakfast. (Patient not taking: Reported on 06/25/2019) 90 tablet 3   No current facility-administered medications for this visit.   Facility-Administered Medications Ordered in Other Visits  Medication Dose Route Frequency Provider Last Rate Last Admin  . sodium chloride flush (NS) 0.9 % injection 10 mL  10 mL Intravenous PRN Cammie Sickle, MD   10 mL at 10/04/15 0901  . sodium chloride flush (NS) 0.9 % injection 10 mL  10 mL Intravenous PRN Cammie Sickle, MD   10  mL at 07/02/17 1020    PHYSICAL EXAMINATION: ECOG PERFORMANCE STATUS: 1 - Symptomatic but completely ambulatory  BP 138/75 (BP Location: Right Arm, Patient Position: Sitting)   Pulse 83   Temp (!) 100.9 F (38.3 C) (Oral)   Resp 18   LMP  (LMP Unknown) Comment: LMP MORE THAN 5 YRS  There were no vitals filed for this visit.  Physical Exam  Constitutional: She is oriented to person, place, and time.  She is alone. She is  walking herself.  HENT:  Head: Normocephalic and atraumatic.  Mouth/Throat: Oropharynx is clear and moist. No oropharyngeal exudate.  Eyes: Pupils are equal, round, and reactive to light.  Cardiovascular: Normal rate and regular rhythm.  Pulmonary/Chest: Effort normal and breath sounds normal. No respiratory distress. She has no wheezes.  Abdominal: Soft. Bowel sounds are normal. She exhibits no distension and no mass. There is no abdominal tenderness. There is no rebound and no guarding.  Musculoskeletal:        General: No tenderness or edema. Normal range of motion.     Cervical back: Normal range of motion and neck supple.  Neurological: She is alert and oriented to person, place, and time.  Skin: Skin is warm.  Psychiatric: Affect normal.   LABORATORY DATA:  I have reviewed the data as listed    Component Value Date/Time   NA 137 06/25/2019 0847   K 2.5 (LL) 06/25/2019 0847   CL 100 06/25/2019 0847   CO2 23 06/25/2019 0847   GLUCOSE 101 (H) 06/25/2019 0847   BUN 7 06/25/2019 0847   CREATININE 0.74 06/25/2019 0847   CALCIUM 8.3 (L) 06/25/2019 0847   PROT 6.8 06/25/2019 0847   ALBUMIN 2.8 (L) 06/25/2019 0847   AST 16 06/25/2019 0847   ALT 15 06/25/2019 0847   ALKPHOS 233 (H) 06/25/2019 0847   BILITOT 1.2 06/25/2019 0847   GFRNONAA >60 06/25/2019 0847   GFRAA >60 06/25/2019 0847    No results found for: SPEP, UPEP  Lab Results  Component Value Date   WBC 11.2 (H) 06/25/2019   NEUTROABS 9.2 (H) 06/25/2019   HGB 9.6 (L) 06/25/2019   HCT  31.5 (L) 06/25/2019   MCV 83.8 06/25/2019   PLT 108 (L) 06/25/2019      Chemistry      Component Value Date/Time   NA 137 06/25/2019 0847   K 2.5 (LL) 06/25/2019 0847   CL 100 06/25/2019 0847   CO2 23 06/25/2019 0847   BUN 7 06/25/2019 0847   CREATININE 0.74 06/25/2019 0847      Component Value Date/Time   CALCIUM 8.3 (L) 06/25/2019 0847   ALKPHOS 233 (H) 06/25/2019 0847   AST 16 06/25/2019 0847   ALT 15 06/25/2019 0847   BILITOT 1.2 06/25/2019 0847       RADIOGRAPHIC STUDIES: I have personally reviewed the radiological images as listed and agreed with the findings in the report. No results found.   ASSESSMENT & PLAN:  Cancer of ascending colon metastatic to intra-abdominal lymph node [facility]) # Colon cancer- cecal/ right-sided; STAGE IV;  December 11th 2020-CT scan-gallbladder distention; worsening disease in the liver-up to 3 lesions new [13 to 14 mm in size].  Currently on opdivo+Stivarga.proceed with starting Stivarga cycle #1 today.   #Hold Opdivo-given ongoing fevers [see discussion below].  Had a long discussion with the patient/mother regarding the serious nature of her malignancy.  Based upon current CEA levels-is unlikely patient is respond to therapy.  However it too early to assess response.  I do not think her previous therapies would be helpful at this time.  #Fever-question etiology check Covid test.  #Chronic diarrhea-1-2 loose stools/ day; continue Lomotil-refilled; continue imodium;-stable  # Abdominal discomfort/epigastric/ back pain -Sec to malignancy; fentany/ hydocodone TID.  Stable; refilled hydrocodone today.   #Hypertension- Currently on Norvasc 5 mg blood pressure-stable.    # hypokalemia-2.7;-continue potassium supplementation.  IV supplementation today.  I spoke at length with the patient's mother- regarding the patient's clinical status/plan of  care.  Also recommend follow-up with palliative care next week.  # DISPOSITION: # IV KCL 20 meq  today;mag 1 gm; HOLD OPDIVO; # covid-testing today # josh -in 1week # follow up in 1 weeks; labs;MD-  Cbc/cmp/magCEA;KCL 6mq IV/mag 1 gm; Opdivo-Dr.B        No orders of the defined types were placed in this encounter.  All questions were answered. The patient knows to call the clinic with any problems, questions or concerns.      GCammie Sickle MD 07/01/2019 1:07 PM

## 2019-06-25 NOTE — Progress Notes (Signed)
Patient presented to clinic today with fever of 100.5 F. She has no other symptoms.  She states that she is taking her oral stivarga and potassium as directed. She has not missed any dosages of stivarga.

## 2019-06-26 ENCOUNTER — Other Ambulatory Visit: Payer: Self-pay | Admitting: Internal Medicine

## 2019-06-26 DIAGNOSIS — C182 Malignant neoplasm of ascending colon: Secondary | ICD-10-CM

## 2019-06-26 LAB — CEA: CEA: 55.4 ng/mL — ABNORMAL HIGH (ref 0.0–4.7)

## 2019-06-26 NOTE — Telephone Encounter (Signed)
CBC with Differential Order: [id number] Status:  Final result  Visible to patient:  Yes (MyChart)  Next appt:  07/02/2019 at 08:30 AM in Oncology ([facility])  Dx:  Cancer of ascending colon metastatic ...  Ref Range & Units 1 d ago  WBC 4.0 - 10.5 K/uL 11.2High    RBC 3.87 - 5.11 MIL/uL 3.76Low    Hemoglobin 12.0 - 15.0 g/dL 9.6Low    HCT 36.0 - 46.0 % 31.5Low    MCV 80.0 - 100.0 fL 83.8   MCH 26.0 - 34.0 pg 25.5Low    MCHC 30.0 - 36.0 g/dL 30.5   RDW 11.5 - 15.5 % 19.3High    Platelets 150 - 400 K/uL 108Low    nRBC 0.0 - 0.2 % 0.0   Neutrophils Relative % % 83   Neutro Abs 1.7 - 7.7 K/uL 9.2High    Lymphocytes Relative % 6   Lymphs Abs 0.7 - 4.0 K/uL 0.7   Monocytes Relative % 10   Monocytes Absolute 0.1 - 1.0 K/uL 1.2High    Eosinophils Relative % 0   Eosinophils Absolute 0.0 - 0.5 K/uL 0.0   Basophils Relative % 0   Basophils Absolute 0.0 - 0.1 K/uL 0.0   Immature Granulocytes % 1   Abs Immature Granulocytes 0.00 - 0.07 K/uL 0.10High    Comment: Performed at [facility], [address] [place], [place] [address]  Resulting Agency  [facility] CLIN LAB      Specimen Collected: 06/25/19 08:47  Last Resulted: 06/25/19 09:12     Lab Flowsheet   Order Details   View Encounter   Lab and Collection Details   Routing   Result History         Other Results from 06/25/2019  Contains critical data Comprehensive metabolic panel  Status:  Final result  Visible to patient:  Yes (MyChart)  Next appt:  07/02/2019 at 08:30 AM in Oncology ([facility])  Dx:  Hypokalemia; Cancer of ascending colo... Order: [id number]  Ref Range & Units 1 d ago  Sodium 135 - 145 mmol/L 137   Potassium 3.5 - 5.1 mmol/L 2.5Low Panic    Comment: RESULTS VERIFIED BY REPEAT TESTING  CRITICAL RESULT CALLED TO, READ BACK BY AND VERIFIED WITH TAMIKA YARBOROUGH AT 09:48 06/25/2019 KMR   Chloride 98 - 111 mmol/L 100   CO2 22 - 32 mmol/L 23   Glucose, Bld 70 - 99 mg/dL 101High    BUN 6  - 20 mg/dL 7   Creatinine, Ser 0.44 - 1.00 mg/dL 0.74   Calcium 8.9 - 10.3 mg/dL 8.3Low    Total Protein 6.5 - 8.1 g/dL 6.8   Albumin 3.5 - 5.0 g/dL 2.8Low    AST 15 - 41 U/L 16   ALT 0 - 44 U/L 15   Alkaline Phosphatase 38 - 126 U/L 233High    Total Bilirubin 0.3 - 1.2 mg/dL 1.2   GFR calc non Af Amer >60 mL/min >60   GFR calc Af Amer >60 mL/min >60   Anion gap 5 - 15 14   Comment: Performed at [facility], [address], [place], [place] [address]  Resulting Agency  [facility] CLIN LAB      Specimen Collected: 06/25/19 08:47  Last Resulted: 06/25/19 09:52     Lab Flowsheet   Order Details   View Encounter   Lab and Collection Details   Routing   Result History           Contains abnormal  data CEA  Status:  Final result  Visible to patient:  Yes (MyChart)  Next appt:  07/02/2019 at 08:30 AM in Oncology ([facility])  Dx:  Cancer of ascending colon metastatic .Marland KitchenMarland Kitchen Order: [id number]  Ref Range & Units 1 d ago  CEA 0.0 - 4.7 ng/mL 55.4High    Comment: (NOTE)                Nonsmokers     <3.9                Smokers       <5.6  [employer] Electrochemiluminescence Immunoassay  (ECLIA)  Values obtained with different assay methods or kits  cannot be used interchangeably. Results cannot be  interpreted as absolute evidence of the presence or  absence of malignant disease.  Performed At: [facility]  [place], [place] [id number]  Rush Farmer MD [id number]   Resulting Agency  [facility] CLIN LAB      Specimen Collected: 06/25/19 08:47  Last Resulted: 06/26/19 03:36     Lab Flowsheet   Order Details   View Encounter   Lab and Collection Details   Routing   Result History           Contains abnormal data Magnesium  Status:  Final result  Visible to patient:  Yes (MyChart)  Next appt:  07/02/2019 at 08:30 AM in Oncology ([facility])  Dx:  Hypokalemia; Cancer of  ascending colo... Order: [id number]  Ref Range & Units 1 d ago  Magnesium 1.7 - 2.4 mg/dL 1.5Low    Comment: Performed at [facility], [address] [place], [place] [address]  Resulting Agency  [facility] CLIN LAB      Specimen Collected: 06/25/19 08:47  Last Resulted: 06/25/19 09:52

## 2019-06-26 NOTE — Telephone Encounter (Signed)
Please advise/RF

## 2019-06-29 ENCOUNTER — Other Ambulatory Visit: Payer: Self-pay | Admitting: Internal Medicine

## 2019-07-02 ENCOUNTER — Inpatient Hospital Stay (HOSPITAL_BASED_OUTPATIENT_CLINIC_OR_DEPARTMENT_OTHER): Payer: BC Managed Care – PPO | Admitting: Hospice and Palliative Medicine

## 2019-07-02 ENCOUNTER — Inpatient Hospital Stay: Payer: BC Managed Care – PPO

## 2019-07-02 ENCOUNTER — Other Ambulatory Visit: Payer: Self-pay

## 2019-07-02 ENCOUNTER — Inpatient Hospital Stay (HOSPITAL_BASED_OUTPATIENT_CLINIC_OR_DEPARTMENT_OTHER): Payer: BC Managed Care – PPO | Admitting: Internal Medicine

## 2019-07-02 VITALS — BP 119/71 | HR 86 | Temp 97.6°F | Resp 20 | Wt 76.0 lb

## 2019-07-02 DIAGNOSIS — C772 Secondary and unspecified malignant neoplasm of intra-abdominal lymph nodes: Secondary | ICD-10-CM

## 2019-07-02 DIAGNOSIS — E876 Hypokalemia: Secondary | ICD-10-CM | POA: Diagnosis not present

## 2019-07-02 DIAGNOSIS — C182 Malignant neoplasm of ascending colon: Secondary | ICD-10-CM

## 2019-07-02 DIAGNOSIS — Z515 Encounter for palliative care: Secondary | ICD-10-CM | POA: Diagnosis not present

## 2019-07-02 DIAGNOSIS — R5383 Other fatigue: Secondary | ICD-10-CM | POA: Diagnosis not present

## 2019-07-02 DIAGNOSIS — E860 Dehydration: Secondary | ICD-10-CM

## 2019-07-02 DIAGNOSIS — Z95828 Presence of other vascular implants and grafts: Secondary | ICD-10-CM

## 2019-07-02 DIAGNOSIS — R634 Abnormal weight loss: Secondary | ICD-10-CM

## 2019-07-02 DIAGNOSIS — C180 Malignant neoplasm of cecum: Secondary | ICD-10-CM | POA: Diagnosis not present

## 2019-07-02 LAB — CBC WITH DIFFERENTIAL/PLATELET
Abs Immature Granulocytes: 0.29 10*3/uL — ABNORMAL HIGH (ref 0.00–0.07)
Basophils Absolute: 0 10*3/uL (ref 0.0–0.1)
Basophils Relative: 0 %
Eosinophils Absolute: 0 10*3/uL (ref 0.0–0.5)
Eosinophils Relative: 0 %
HCT: 30.9 % — ABNORMAL LOW (ref 36.0–46.0)
Hemoglobin: 9.8 g/dL — ABNORMAL LOW (ref 12.0–15.0)
Immature Granulocytes: 1 %
Lymphocytes Relative: 4 %
Lymphs Abs: 0.9 10*3/uL (ref 0.7–4.0)
MCH: 25.9 pg — ABNORMAL LOW (ref 26.0–34.0)
MCHC: 31.7 g/dL (ref 30.0–36.0)
MCV: 81.7 fL (ref 80.0–100.0)
Monocytes Absolute: 2.2 10*3/uL — ABNORMAL HIGH (ref 0.1–1.0)
Monocytes Relative: 11 %
Neutro Abs: 17.1 10*3/uL — ABNORMAL HIGH (ref 1.7–7.7)
Neutrophils Relative %: 84 %
Platelets: 191 10*3/uL (ref 150–400)
RBC: 3.78 MIL/uL — ABNORMAL LOW (ref 3.87–5.11)
RDW: 20.3 % — ABNORMAL HIGH (ref 11.5–15.5)
WBC: 20.5 10*3/uL — ABNORMAL HIGH (ref 4.0–10.5)
nRBC: 0 % (ref 0.0–0.2)

## 2019-07-02 LAB — COMPREHENSIVE METABOLIC PANEL
ALT: 18 U/L (ref 0–44)
AST: 28 U/L (ref 15–41)
Albumin: 2.3 g/dL — ABNORMAL LOW (ref 3.5–5.0)
Alkaline Phosphatase: 200 U/L — ABNORMAL HIGH (ref 38–126)
Anion gap: 14 (ref 5–15)
BUN: 10 mg/dL (ref 6–20)
CO2: 28 mmol/L (ref 22–32)
Calcium: 8.2 mg/dL — ABNORMAL LOW (ref 8.9–10.3)
Chloride: 99 mmol/L (ref 98–111)
Creatinine, Ser: 0.76 mg/dL (ref 0.44–1.00)
GFR calc Af Amer: >60 mL/min (ref >60)
GFR calc non Af Amer: >60 mL/min (ref >60)
Glucose, Bld: 159 mg/dL — ABNORMAL HIGH (ref 70–99)
Potassium: 2.2 mmol/L — CL (ref 3.5–5.1)
Sodium: 141 mmol/L (ref 135–145)
Total Bilirubin: 1.2 mg/dL (ref 0.3–1.2)
Total Protein: 6.7 g/dL (ref 6.5–8.1)

## 2019-07-02 MED ORDER — DEXAMETHASONE SODIUM PHOSPHATE 10 MG/ML IJ SOLN
8.0000 mg | Freq: Once | INTRAMUSCULAR | Status: AC
  Administered 2019-07-02: 8 mg via INTRAVENOUS

## 2019-07-02 MED ORDER — SODIUM CHLORIDE 0.9 % IV SOLN
Freq: Once | INTRAVENOUS | Status: AC
  Filled 2019-07-02: qty 20

## 2019-07-02 MED ORDER — SODIUM CHLORIDE 0.9% FLUSH
10.0000 mL | Freq: Once | INTRAVENOUS | Status: AC
  Administered 2019-07-02: 09:00:00 10 mL via INTRAVENOUS
  Filled 2019-07-02: qty 10

## 2019-07-02 MED ORDER — HEPARIN SOD (PORK) LOCK FLUSH 100 UNIT/ML IV SOLN
500.0000 [IU] | Freq: Once | INTRAVENOUS | Status: AC
  Administered 2019-07-02: 500 [IU] via INTRAVENOUS
  Filled 2019-07-02: qty 5

## 2019-07-02 MED ORDER — SODIUM CHLORIDE 0.9 % IV SOLN
Freq: Once | INTRAVENOUS | Status: AC
  Filled 2019-07-02: qty 250

## 2019-07-02 MED ORDER — FENTANYL 50 MCG/HR TD PT72: 1.0000 | MEDICATED_PATCH | TRANSDERMAL | 0 refills | Status: DC

## 2019-07-02 MED ORDER — HEPARIN SOD (PORK) LOCK FLUSH 100 UNIT/ML IV SOLN
INTRAVENOUS | Status: AC
  Filled 2019-07-02: qty 5

## 2019-07-02 MED ORDER — MAGIC MOUTHWASH W/LIDOCAINE: 5.0000 mL | Freq: Four times a day (QID) | ORAL | 3 refills | Status: AC

## 2019-07-02 NOTE — Progress Notes (Signed)
[place] OFFICE PROGRESS NOTE  Patient Care Team: Patient, No Pcp Per as PCP - General (General Practice)  Cancer Staging No matching staging information was found for the patient.   Oncology History Overview Note  # [facility] 2017- COLON CANCER- STAGE IV [s/p right hemi-colectomy; pT4apN2 (7/19LN)]; Pre-op CEA- [id number]  PET-  mesenteric adenopathy/mediastinal adenopathy/right-sided subclavicular lymph node.    April 17th -START FOLFOX; May 1st Add Avastin;   Aug 16th- Disc OX [sec PN]; con 5FU-Avastin; NOV 27th CT C/A/P- CR; except for 26m LUL; 5FU-Avastin q 3 W [Poor tolerance to FOLFOX].  # June/July 2019- Progression of Liver/Abd LN; July 2019- START FOLFIRI + Avastin; STOP iri 02/25/2018- sec to diarrhea; cont Avastin + 5FU  # March 2018- L2 uptake MET s/p RT [[facility]2018]; mid April X-geva  # L1 ablation/ostecool-  Jan 3rd 2020-   # march 2nd 2020- decrease 5FU CIV  to [id number]/  #June 17th, 2020-liver ablation; 3.5 cm lesion.  Intolerance to 5-FU plus Avastin.  Discontinued; Side effects/weight loss/ quality of life  #July 13th -Lonsurf cycle #1; x2 cycles; September 2020-progressive disease in the mesenteric region; liver disease stable;  #March 05, 2019-5-FU plus Avastin [peripheral neuropathy/diarrhea]; DEC 2020- Progression,  # JAN 2021- OPDIVO + REGO  # July 2nd CT scan-right laryngeal nerve palsy/hoarseness of voice [Dr. McQueen] CT-chest NEG; MRI brain negative.  # MOLECULAR TESTING: K-ras-EXON 2- MUTATED; MSI- STABLE.   # palliative care- [name].   DIAGNOSIS: colon ca  STAGE:  IV ;GOALS: pallaitive  CURRENT/MOST RECENT THERAPY: Opdivo [C]+ Regorefenib.     Cancer of ascending colon metastatic to intra-abdominal lymph node ([facility] [place] 05/28/2019 -  Chemotherapy   The patient had nivolumab (OPDIVO) 240 mg in sodium chloride 0.9 % 100 mL chemo infusion, 240 mg, Intravenous, Once, 2 of 6 cycles Administration: 240 mg (05/28/2019), 240 mg  (06/11/2019)  for chemotherapy treatment.      INTERVAL HISTORY:  Nancy SCHRADE89.o.  female pleasant patient above history of metastatic adenocarcinoma of the colon-currently on OPDIVO + REGO.  Patient complains of multiple oral ulcers.  Difficulty swallowing.  Poor p.o. intake.  She finished Stivarga cycle #1 last week.  She is currently off week.  She continues to take K. Dur at home.  Chronic mild-moderate diarrhea.  Continues to have in pain of the abdomen/back for which she has been taking hydrocodone fentanyl patch.   Complains of fatigue.  Complains of generalized weakness positive weight loss.  Denies any fevers or chills or worsening cough.  Review of Systems  Constitutional: Positive for malaise/fatigue. Negative for chills, diaphoresis and fever.  HENT: Negative for nosebleeds and sore throat.   Eyes: Negative for double vision.  Respiratory: Negative for cough, hemoptysis, sputum production, shortness of breath and wheezing.   Cardiovascular: Negative for chest pain, palpitations, orthopnea and leg swelling.  Gastrointestinal: Positive for abdominal pain and nausea. Negative for constipation, heartburn, melena and vomiting.  Genitourinary: Negative for dysuria, frequency and urgency.  Musculoskeletal: Positive for back pain. Negative for joint pain.  Skin: Negative.  Negative for itching and rash.  Neurological: Positive for tingling. Negative for dizziness, focal weakness, weakness and headaches.  Endo/Heme/Allergies: Does not bruise/bleed easily.  Psychiatric/Behavioral: Negative for depression. The patient is not nervous/anxious and does not have insomnia.       PAST MEDICAL HISTORY :  Past Medical History:  Diagnosis Date  . Chicken pox   . [place]   Partial colectomy  07/2015 + chemo tx's  . Hypertension   . Hypokalemia   . Menopause    > 5 yrs  . Peripheral neuropathy due to chemotherapy ([place])    bi lat feet    PAST SURGICAL HISTORY :    Past Surgical History:  Procedure Laterality Date  . BILIARY STENT PLACEMENT  05/05/2019   Procedure: BILIARY STENT PLACEMENT;  Surgeon: Ronnette Juniper, MD;  Location: [place];  Service: Gastroenterology;;  . BREAST BIOPSY Right    2007? pt unsure done by [facility]  . COLONOSCOPY    . COLONOSCOPY WITH PROPOFOL N/A 10/14/2015   Procedure: COLONOSCOPY WITH PROPOFOL;  Surgeon: Hulen Luster, MD;  Location: [facility] ENDOSCOPY;  Service: Endoscopy;  Laterality: N/A;  . COLOSTOMY REVISION Right 08/09/2015   Procedure: COLON RESECTION RIGHT;  Surgeon: Florene Glen, MD;  Location: [facility] ORS;  Service: General;  Laterality: Right;  . ECTOPIC PREGNANCY SURGERY    . ERCP N/A 05/05/2019   Procedure: ENDOSCOPIC RETROGRADE CHOLANGIOPANCREATOGRAPHY (ERCP);  Surgeon: Ronnette Juniper, MD;  Location: [place];  Service: Gastroenterology;  Laterality: N/A;  . IR BONE TUMOR(S)RF ABLATION  05/24/2018  . IR KYPHO LUMBAR INC FX REDUCE BONE BX UNI/BIL CANNULATION INC/IMAGING  05/24/2018  . IR RADIOLOGIST EVAL & MGMT  05/08/2018  . IR RADIOLOGIST EVAL & MGMT  06/18/2018  . IR RADIOLOGIST EVAL & MGMT  07/31/2018  . IR RADIOLOGIST EVAL & MGMT  12/19/2018  . IR RADIOLOGIST EVAL & MGMT  02/27/2019  . PANCREATIC STENT PLACEMENT  05/05/2019   Procedure: PANCREATIC STENT PLACEMENT;  Surgeon: Ronnette Juniper, MD;  Location: [facility] ENDOSCOPY;  Service: Gastroenterology;;  . Sol Passer PLACEMENT N/A 09/01/2015   Procedure: INSERTION PORT-A-CATH;  Surgeon: Florene Glen, MD;  Location: [facility] ORS;  Service: General;  Laterality: N/A;  . RADIOLOGY WITH ANESTHESIA N/A 11/06/2018   Procedure: CT-MICROWAVE THERMAL ABLATION/LIVER;  Surgeon: Corrie Mckusick, DO;  Location: [facility] ORS;  Service: Anesthesiology;  Laterality: N/A;  . REMOVAL OF STONES  05/05/2019   Procedure: REMOVAL OF STONES;  Surgeon: Ronnette Juniper, MD;  Location: [place];  Service: Gastroenterology;;  . SHOULDER SURGERY Left   . SPHINCTEROTOMY  05/05/2019   Procedure: SPHINCTEROTOMY;   Surgeon: Ronnette Juniper, MD;  Location: [facility] ENDOSCOPY;  Service: Gastroenterology;;    FAMILY HISTORY :   Family History  Problem Relation Age of Onset  . Hypertension Mother   . Arthritis Father   . Prostate cancer Father   . Diabetes Maternal Grandmother   . Colon cancer Maternal Grandfather        colon    SOCIAL HISTORY:   Social History   Tobacco Use  . Smoking status: Former Smoker    Packs/day: 0.25    Years: 2.00    Pack years: 0.50  . Smokeless tobacco: Never Used  . Tobacco comment: recently quit  Substance Use Topics  . Alcohol use: No    Alcohol/week: 0.0 standard drinks  . Drug use: No    ALLERGIES:  has No Known Allergies.  MEDICATIONS:  Current Outpatient Medications  Medication Sig Dispense Refill  . acetaminophen (TYLENOL) 325 MG tablet Take 2 tablets (650 mg total) by mouth every 6 (six) hours as needed for mild pain (or Fever >/= 101).    Marland Kitchen amLODipine (NORVASC) 5 MG tablet Take 2 tablets (10 mg total) by mouth daily. 30 tablet 6  . Calcium Carbonate-Vitamin D (CALCIUM 600/VITAMIN D PO) Take 1 tablet by mouth every evening.     . cloNIDine (CATAPRES -  DOSED IN MG/24 HR) 0.1 mg/24hr patch Place 1 patch (0.1 mg total) onto the skin every Monday at 6 PM. 4 patch 12  . diphenoxylate-atropine (LOMOTIL) 2.5-0.025 MG tablet Take 1 tablet by mouth 4 (four) times daily as needed for diarrhea or loose stools. Take it along with immodium 60 tablet 4  . DULoxetine (CYMBALTA) 20 MG capsule TAKE 2 CAPSULES DAILY 180 capsule 3  . feeding supplement (BOOST HIGH PROTEIN) LIQD Take 1 Container by mouth daily.     . fentaNYL (DURAGESIC) 50 MCG/HR Place 1 patch onto the skin every 3 (three) days. 10 patch 0  . gabapentin (NEURONTIN) 300 MG capsule Take 1-2 capsules (300-600 mg total) by mouth See admin instructions. Take 1 capsule (300 mg) by mouth daily in the morning & take 2 capsules (600 mg) by mouth at night. 90 capsule 6  . HYDROcodone-acetaminophen (NORCO/VICODIN) 5-325  MG tablet Take 1 tablet by mouth every 8 (eight) hours as needed (pain.). 90 tablet 0  . lidocaine-prilocaine (EMLA) cream Apply 1 application topically as needed. Apply generously over the Mediport 45 minutes prior to chemotherapy. (Patient taking differently: Apply 1 application topically as needed (prior to chemotherapy.). ) 30 g 6  . loperamide (IMODIUM) 2 MG capsule Take 2 mg by mouth 4 (four) times daily as needed for diarrhea or loose stools.     . Multiple Vitamin (MULTIVITAMIN WITH MINERALS) TABS tablet Take 1 tablet by mouth daily.    . ondansetron (ZOFRAN) 8 MG tablet One pill every 8 hours as needed for nausea/vomitting. (Patient taking differently: Take 8 mg by mouth every 8 (eight) hours as needed for nausea or vomiting. ) 60 tablet 0  . potassium chloride SA (KLOR-CON) 20 MEQ tablet Take 1 tablet (20 mEq total) by mouth 3 (three) times daily. 90 tablet 1  . prochlorperazine (COMPAZINE) 10 MG tablet Take 1 tablet (10 mg total) by mouth every 6 (six) hours as needed for nausea or vomiting. 90 tablet 3  . STIVARGA 40 MG tablet TAKE 2 TABLETS (80 MG TOTAL) BY MOUTH DAILY WITH BREAKFAST. TAKE FOR 3 WEEKS; AND 1 WEEK OFF. TAKE WITH LOW FAT MEAL. DO NOT START UNTIL OK BY MD. 56 tablet 0  . dronabinol (MARINOL) 5 MG capsule Take 1 capsule (5 mg total) by mouth 2 (two) times daily before lunch and supper. (Patient not taking: Reported on 06/25/2019) 60 capsule 4  . ferrous sulfate (FERROUSUL) 325 (65 FE) MG tablet Take 1 tablet (325 mg total) by mouth daily with breakfast. (Patient not taking: Reported on 06/25/2019) 90 tablet 3  . magic mouthwash w/lidocaine SOLN Take 5 mLs by mouth 4 (four) times daily. 480 mL 3   No current facility-administered medications for this visit.   Facility-Administered Medications Ordered in Other Visits  Medication Dose Route Frequency Provider Last Rate Last Admin  . sodium chloride flush (NS) 0.9 % injection 10 mL  10 mL Intravenous PRN Cammie Sickle, MD    10 mL at 10/04/15 0901  . sodium chloride flush (NS) 0.9 % injection 10 mL  10 mL Intravenous PRN Cammie Sickle, MD   10 mL at 07/02/17 1020    PHYSICAL EXAMINATION: ECOG PERFORMANCE STATUS: 1 - Symptomatic but completely ambulatory  BP 119/71 (BP Location: Left Arm, Patient Position: Sitting, Cuff Size: Small)   Pulse 86   Temp 97.6 F (36.4 C) (Tympanic)   Resp 20   Wt 76 lb (34.5 kg)   LMP  (LMP Unknown) Comment: LMP  MORE THAN 5 YRS  BMI 14.84 kg/m   Filed Weights   07/02/19 0913  Weight: 76 lb (34.5 kg)   Physical Exam  Constitutional: She is oriented to person, place, and time and well-developed, well-nourished, and in no distress.  Patient appears weak.  Appears cachectic.  Accompanied by mother.  HENT:  Head: Normocephalic and atraumatic.  Mouth/Throat: No oropharyngeal exudate.  Diffuse erythema/multiple ulcerations noted in the oral mucosa.  No thrush.  Eyes: Pupils are equal, round, and reactive to light.  Cardiovascular: Normal rate and regular rhythm.  Pulmonary/Chest: Effort normal and breath sounds normal. No respiratory distress. She has no wheezes.  Abdominal: Soft. Bowel sounds are normal. She exhibits no distension and no mass. There is no abdominal tenderness. There is no rebound and no guarding.  Positive for hepatomegaly.  Musculoskeletal:        General: No tenderness or edema. Normal range of motion.     Cervical back: Normal range of motion and neck supple.  Neurological: She is alert and oriented to person, place, and time.  Skin: Skin is warm.  Psychiatric: Affect normal.    LABORATORY DATA:  I have reviewed the data as listed    Component Value Date/Time   NA 141 07/02/2019 0851   K 2.2 (LL) 07/02/2019 0851   CL 99 07/02/2019 0851   CO2 28 07/02/2019 0851   GLUCOSE 159 (H) 07/02/2019 0851   BUN 10 07/02/2019 0851   CREATININE 0.76 07/02/2019 0851   CALCIUM 8.2 (L) 07/02/2019 0851   PROT 6.7 07/02/2019 0851   ALBUMIN 2.3 (L)  07/02/2019 0851   AST 28 07/02/2019 0851   ALT 18 07/02/2019 0851   ALKPHOS 200 (H) 07/02/2019 0851   BILITOT 1.2 07/02/2019 0851   GFRNONAA >60 07/02/2019 0851   GFRAA >60 07/02/2019 0851    No results found for: SPEP, UPEP  Lab Results  Component Value Date   WBC 20.5 (H) 07/02/2019   NEUTROABS 17.1 (H) 07/02/2019   HGB 9.8 (L) 07/02/2019   HCT 30.9 (L) 07/02/2019   MCV 81.7 07/02/2019   PLT 191 07/02/2019      Chemistry      Component Value Date/Time   NA 141 07/02/2019 0851   K 2.2 (LL) 07/02/2019 0851   CL 99 07/02/2019 0851   CO2 28 07/02/2019 0851   BUN 10 07/02/2019 0851   CREATININE 0.76 07/02/2019 0851      Component Value Date/Time   CALCIUM 8.2 (L) 07/02/2019 0851   ALKPHOS 200 (H) 07/02/2019 0851   AST 28 07/02/2019 0851   ALT 18 07/02/2019 0851   BILITOT 1.2 07/02/2019 0851       RADIOGRAPHIC STUDIES: I have personally reviewed the radiological images as listed and agreed with the findings in the report. No results found.   ASSESSMENT & PLAN:  Cancer of ascending colon metastatic to intra-abdominal lymph node [facility]) # Colon cancer- cecal/ right-sided; STAGE IV;  December 11th 2020-CT scan-gallbladder distention; worsening disease in the liver-up to 3 lesions new [13 to 14 mm in size].   # Currently on opdivo+Stivarga s/p Stivarga cycle #1  [80 mg a day/day -3 weeks on 1 week off].   # I would recommend holding Opdivo given significant lab abnormalities/mucositis/severe weight loss [see below].   # Mucositis-grade 3-secondary to [employer].  Hold restarting Stivarga at this time.  recommend Magic mouthwash.  #Chronic diarrhea-1-2 loose stools/ day; continue Lomotil/Imodium.  # Abdominal discomfort/epigastric/ back pain -Sec to malignancy; fentany/ hydocodone  TID.  Stable refill patches.   #Poor p.o. intake/weight loss-multifactorial including progressive malignancy-recommend IV fluids; dexamethasone today  # hypokalemia-2.2;-continue potassium  supplementation.  IV 40 mEq supplementation today.  Magnesium 1 g-mag 1.5.  #A long discussion with patient and mother regarding my concerns of poor tolerance to therapy/declining performance status-PS-2-3;-severe weight loss which predict poor response to therapy.  Discussed that we might have to hold further therapy if patient's clinical status does not improve; or continues get worse.  Patient/mother-looking for treatment options-we will make a referral to [facility] as per the preference.  Awaiting palliative care evaluation today.  # DISPOSITION: # IV KCL 40 meq today; mag 1 gm; HOLD OPDIVO; Dex 8 gm IVP; IVF 1lit over 1 h.   # referral to [facility]/second opinion re: colon cancer ASAP.   # [facility] in 1 week ; IVF over 1 hour; IV KCL 40 meq today; mag 1 gm.    # follow up in 2 weeks; labs;MD-  Cbc/cmp/magCEA;KCL 40 meq IV/mag 1 gm;IVF over 1 hour; possible opdivo -Dr.B       Orders Placed This Encounter  Procedures  . Basic metabolic panel    Standing Status:   Future    Standing Expiration Date:   07/01/2020  . Magnesium    Standing Status:   Future    Standing Expiration Date:   07/01/2020  . Magnesium    Standing Status:   Future    Standing Expiration Date:   07/01/2020  . Ambulatory referral to Oncology    Referral Priority:   Urgent    Referral Type:   Consultation    Referral Reason:   Specialty Services Required    Referred to Provider:   Lynita Lombard, MD    Requested Specialty:   Medical Oncology    Number of Visits Requested:   1   All questions were answered. The patient knows to call the clinic with any problems, questions or concerns.      Cammie Sickle, MD 07/03/2019 8:02 AM

## 2019-07-02 NOTE — Progress Notes (Signed)
[place]  [phone] [phone]   Name: Nancy Clark Date: 07/02/2019 MRN: [id number]  DOB: 03/28/1961  Patient Care Team: Patient, No Pcp Per as PCP - General (General Practice)    REASON FOR CONSULTATION: Nancy Clark is a 59 y.o. female with multiple medical problems including stage IV colorectal cancer status post right hemicolectomy and XRT.  She is also status post liver ablation to area of hepatic metastasis.  Patient is status post 5-FU plus bevacizumaband now on treatment with nivolumab.  She was referred to palliative care to help discuss goals and manage ongoing symptoms..    SOCIAL HISTORY:     reports that she has quit smoking. She has a 0.50 pack-year smoking history. She has never used smokeless tobacco. She reports that she does not drink alcohol or use drugs.   Patient is married and lives at home with her husband and son.  She has another son who lives in [place].  Patient previously worked at [employer] in environmental services.  ADVANCE DIRECTIVES:  Does not have  CODE STATUS:   PAST MEDICAL HISTORY: Past Medical History:  Diagnosis Date  . Chicken pox   . Colon cancer ([place])    Partial colectomy 07/2015 + chemo tx's  . Hypertension   . Hypokalemia   . Menopause    > 5 yrs  . Peripheral neuropathy due to chemotherapy ([place])    bi lat feet    PAST SURGICAL HISTORY:  Past Surgical History:  Procedure Laterality Date  . BILIARY STENT PLACEMENT  05/05/2019   Procedure: BILIARY STENT PLACEMENT;  Surgeon: Ronnette Juniper, MD;  Location: [place];  Service: Gastroenterology;;  . BREAST BIOPSY Right    2007? pt unsure done by [facility]  . COLONOSCOPY    . COLONOSCOPY WITH PROPOFOL N/A 10/14/2015   Procedure: COLONOSCOPY WITH PROPOFOL;  Surgeon: Hulen Luster, MD;  Location: [facility] ENDOSCOPY;  Service: Endoscopy;  Laterality: N/A;  . COLOSTOMY REVISION Right 08/09/2015   Procedure: COLON RESECTION RIGHT;  Surgeon: Florene Glen, MD;  Location: [facility] ORS;  Service: General;  Laterality: Right;  . ECTOPIC PREGNANCY SURGERY    . ERCP N/A 05/05/2019   Procedure: ENDOSCOPIC RETROGRADE CHOLANGIOPANCREATOGRAPHY (ERCP);  Surgeon: Ronnette Juniper, MD;  Location: [place];  Service: Gastroenterology;  Laterality: N/A;  . IR BONE TUMOR(S)RF ABLATION  05/24/2018  . IR KYPHO LUMBAR INC FX REDUCE BONE BX UNI/BIL CANNULATION INC/IMAGING  05/24/2018  . IR RADIOLOGIST EVAL & MGMT  05/08/2018  . IR RADIOLOGIST EVAL & MGMT  06/18/2018  . IR RADIOLOGIST EVAL & MGMT  07/31/2018  . IR RADIOLOGIST EVAL & MGMT  12/19/2018  . IR RADIOLOGIST EVAL & MGMT  02/27/2019  . PANCREATIC STENT PLACEMENT  05/05/2019   Procedure: PANCREATIC STENT PLACEMENT;  Surgeon: Ronnette Juniper, MD;  Location: [facility]OSCOPY;  Service: Gastroenterology;;  . Sol Passer PLACEMENT N/A 09/01/2015   Procedure: INSERTION PORT-A-CATH;  Surgeon: Florene Glen, MD;  Location: [facility] ORS;  Service: General;  Laterality: N/A;  . RADIOLOGY WITH ANESTHESIA N/A 11/06/2018   Procedure: CT-MICROWAVE THERMAL ABLATION/LIVER;  Surgeon: Corrie Mckusick, DO;  Location: [facility];  Service: Anesthesiology;  Laterality: N/A;  . REMOVAL OF STONES  05/05/2019   Procedure: REMOVAL OF STONES;  Surgeon: Ronnette Juniper, MD;  Location: [place];  Service: Gastroenterology;;  . SHOULDER SURGERY Left   . SPHINCTEROTOMY  05/05/2019   Procedure: SPHINCTEROTOMY;  Surgeon: Ronnette Juniper, MD;  Location:  [place] ENDOSCOPY;  Service: Gastroenterology;;    HEMATOLOGY/ONCOLOGY HISTORY:  Oncology History Overview Note  # FEB-MARCH 2017- COLON CANCER- STAGE IV [s/p right hemi-colectomy; pT4apN2 (7/19LN)]; Pre-op CEA- [id number]  PET-  mesenteric adenopathy/mediastinal adenopathy/right-sided subclavicular lymph node.    April 17th -START FOLFOX; May 1st Add Avastin;   Aug 16th- Disc OX [sec PN]; con 5FU-Avastin; NOV 27th CT C/A/P- CR; except for 46m LUL; 5FU-Avastin q  3 W [Poor tolerance to FOLFOX].  # June/July 2019- Progression of Liver/Abd LN; July 2019- START FOLFIRI + Avastin; STOP iri 02/25/2018- sec to diarrhea; cont Avastin + 5FU  # March 2018- L2 uptake MET s/p RT [[facility]2018]; mid April X-geva  # L1 ablation/ostecool-  Jan 3rd 2020-   # march 2nd 2020- decrease 5FU CIV  to 20065mm2/  #June 17th, 2020-liver ablation; 3.5 cm lesion.  Intolerance to 5-FU plus Avastin.  Discontinued; Side effects/weight loss/ quality of life  #July 13th -Lonsurf cycle #1; x2 cycles; September 2020-progressive disease in the mesenteric region; liver disease stable;  #March 05, 2019-5-FU plus Avastin [peripheral neuropathy/diarrhea]; DEC 2020- Progression,  # JAN 2021- OPDIVO + REGO  # July 2nd CT scan-right laryngeal nerve palsy/hoarseness of voice [Dr. McQueen] CT-chest NEG; MRI brain negative.  # MOLECULAR TESTING: K-ras-EXON 2- MUTATED; MSI- STABLE.   # palliative care- Josh Amado Andal.   DIAGNOSIS: colon ca  STAGE:  IV ;GOALS: pallaitive  CURRENT/MOST RECENT THERAPY: Opdivo [C]+ Regorefenib.     Cancer of ascending colon metastatic to intra-abdominal lymph node (HCMercer 05/28/2019 -  Chemotherapy   The patient had nivolumab (OPDIVO) 240 mg in sodium chloride 0.9 % 100 mL chemo infusion, 240 mg, Intravenous, Once, 2 of 6 cycles Administration: 240 mg (05/28/2019), 240 mg (06/11/2019)  for chemotherapy treatment.      ALLERGIES:  has No Known Allergies.  MEDICATIONS:  Current Outpatient Medications  Medication Sig Dispense Refill  . acetaminophen (TYLENOL) 325 MG tablet Take 2 tablets (650 mg total) by mouth every 6 (six) hours as needed for mild pain (or Fever >/= 101).    . Marland KitchenmLODipine (NORVASC) 5 MG tablet Take 2 tablets (10 mg total) by mouth daily. 30 tablet 6  . Calcium Carbonate-Vitamin D (CALCIUM 600/VITAMIN D PO) Take 1 tablet by mouth every evening.     . cloNIDine (CATAPRES - DOSED IN MG/24 HR) 0.1 mg/24hr patch Place 1 patch (0.1 mg total)  onto the skin every Monday at 6 PM. 4 patch 12  . diphenoxylate-atropine (LOMOTIL) 2.5-0.025 MG tablet Take 1 tablet by mouth 4 (four) times daily as needed for diarrhea or loose stools. Take it along with immodium 60 tablet 4  . dronabinol (MARINOL) 5 MG capsule Take 1 capsule (5 mg total) by mouth 2 (two) times daily before lunch and supper. (Patient not taking: Reported on 06/25/2019) 60 capsule 4  . DULoxetine (CYMBALTA) 20 MG capsule TAKE 2 CAPSULES DAILY 180 capsule 3  . feeding supplement (BOOST HIGH PROTEIN) LIQD Take 1 Container by mouth daily.     . fentaNYL (DURAGESIC) 50 MCG/HR Place 1 patch onto the skin every 3 (three) days. 10 patch 0  . ferrous sulfate (FERROUSUL) 325 (65 FE) MG tablet Take 1 tablet (325 mg total) by mouth daily with breakfast. (Patient not taking: Reported on 06/25/2019) 90 tablet 3  . gabapentin (NEURONTIN) 300 MG capsule Take 1-2 capsules (300-600 mg total) by mouth See admin instructions. Take 1 capsule (300 mg) by mouth daily in the morning & take 2 capsules (600  mg) by mouth at night. 90 capsule 6  . HYDROcodone-acetaminophen (NORCO/VICODIN) 5-325 MG tablet Take 1 tablet by mouth every 8 (eight) hours as needed (pain.). 90 tablet 0  . lidocaine-prilocaine (EMLA) cream Apply 1 application topically as needed. Apply generously over the Mediport 45 minutes prior to chemotherapy. (Patient taking differently: Apply 1 application topically as needed (prior to chemotherapy.). ) 30 g 6  . loperamide (IMODIUM) 2 MG capsule Take 2 mg by mouth 4 (four) times daily as needed for diarrhea or loose stools.     . magic mouthwash w/lidocaine SOLN Take 5 mLs by mouth 4 (four) times daily. 480 mL 3  . Multiple Vitamin (MULTIVITAMIN WITH MINERALS) TABS tablet Take 1 tablet by mouth daily.    . ondansetron (ZOFRAN) 8 MG tablet One pill every 8 hours as needed for nausea/vomitting. (Patient taking differently: Take 8 mg by mouth every 8 (eight) hours as needed for nausea or vomiting. ) 60  tablet 0  . potassium chloride SA (KLOR-CON) 20 MEQ tablet Take 1 tablet (20 mEq total) by mouth 3 (three) times daily. 90 tablet 1  . prochlorperazine (COMPAZINE) 10 MG tablet Take 1 tablet (10 mg total) by mouth every 6 (six) hours as needed for nausea or vomiting. 90 tablet 3  . STIVARGA 40 MG tablet TAKE 2 TABLETS (80 MG TOTAL) BY MOUTH DAILY WITH BREAKFAST. TAKE FOR 3 WEEKS; AND 1 WEEK OFF. TAKE WITH LOW FAT MEAL. DO NOT START UNTIL OK BY MD. 56 tablet 0   No current facility-administered medications for this visit.   Facility-Administered Medications Ordered in Other Visits  Medication Dose Route Frequency Provider Last Rate Last Admin  . sodium chloride flush (NS) 0.9 % injection 10 mL  10 mL Intravenous PRN Cammie Sickle, MD   10 mL at 10/04/15 0901  . sodium chloride flush (NS) 0.9 % injection 10 mL  10 mL Intravenous PRN Cammie Sickle, MD   10 mL at 07/02/17 1020    VITAL SIGNS: LMP  (LMP Unknown) Comment: LMP MORE THAN 5 YRS There were no vitals filed for this visit.  Estimated body mass index is 14.84 kg/m as calculated from the following:   Height as of 06/11/19: 5' (1.524 m).   Weight as of an earlier encounter on 07/02/19: 76 lb (34.5 kg).  LABS: CBC:    Component Value Date/Time   WBC 20.5 (H) 07/02/2019 0851   HGB 9.8 (L) 07/02/2019 0851   HCT 30.9 (L) 07/02/2019 0851   PLT 191 07/02/2019 0851   MCV 81.7 07/02/2019 0851   NEUTROABS 17.1 (H) 07/02/2019 0851   LYMPHSABS 0.9 07/02/2019 0851   MONOABS 2.2 (H) 07/02/2019 0851   EOSABS 0.0 07/02/2019 0851   BASOSABS 0.0 07/02/2019 0851   Comprehensive Metabolic Panel:    Component Value Date/Time   NA 141 07/02/2019 0851   K 2.2 (LL) 07/02/2019 0851   CL 99 07/02/2019 0851   CO2 28 07/02/2019 0851   BUN 10 07/02/2019 0851   CREATININE 0.76 07/02/2019 0851   GLUCOSE 159 (H) 07/02/2019 0851   CALCIUM 8.2 (L) 07/02/2019 0851   AST 28 07/02/2019 0851   ALT 18 07/02/2019 0851   ALKPHOS 200 (H)  07/02/2019 0851   BILITOT 1.2 07/02/2019 0851   PROT 6.7 07/02/2019 0851   ALBUMIN 2.3 (L) 07/02/2019 0851    RADIOGRAPHIC STUDIES: No results found.  PERFORMANCE STATUS (ECOG) : 2 - Symptomatic, <50% confined to bed  Review of Systems Unless otherwise  noted, a complete review of systems is negative.  Physical Exam General: NAD, frail appearing, thin Pulmonary: Unlabored Extremities: no edema, no joint deformities Skin: no rashes Neurological: Weakness but otherwise nonfocal  IMPRESSION: Patient was seen in infusion area.  Recent CT scan in December on 05/02/2019 revealed new biliary obstruction at the distal CBD due to compressive malignancy with biliary obstruction of the left and right hepatic lobes with 3 new hepatic lesions.  Patient underwent biliary stenting on 05/05/2019.  Patient has had rising CEA levels.  Opdivo has been held due to fevers.  There is concerned that patient is unlikely to respond to therapies.  I did speak with her today regarding her overall goals.  Patient seem to recognize that her prognosis is poor.  She does seem to continue to desire ongoing treatment for now.  There has been discussion about referral for a second opinion.  I had previously sent patient home with a MOST Form.  She says that she has had some discussions with her family but has not made any decisions.  She requested a new form today and we will plan to readdress this at time of next visit.  PLAN: -Continue current scope of treatment -MOST Form reviewed -RTC 2 to 3 weeks   Patient expressed understanding and was in agreement with this plan. She also understands that She can call the clinic at any time with any questions, concerns, or complaints.     Time Total: 20 minutes  Visit consisted of counseling and education dealing with the complex and emotionally intense issues of symptom management and palliative care in the setting of serious and potentially life-threatening  illness.Greater than 50%  of this time was spent counseling and coordinating care related to the above assessment and plan.  Signed by: Altha Harm, PhD, NP-C

## 2019-07-02 NOTE — Assessment & Plan Note (Addendum)
#   Colon cancer- cecal/ right-sided; STAGE IV;  December 11th 2020-CT scan-gallbladder distention; worsening disease in the liver-up to 3 lesions new [13 to 14 mm in size].   # Currently on opdivo+Stivarga s/p Stivarga cycle #1  [80 mg a day/day -3 weeks on 1 week off].   # I would recommend holding Opdivo given significant lab abnormalities/mucositis/severe weight loss [see below].   # Mucositis-grade 3-secondary to [employer].  Hold restarting Stivarga at this time.  recommend Magic mouthwash.  #Chronic diarrhea-1-2 loose stools/ day; continue Lomotil/Imodium.  # Abdominal discomfort/epigastric/ back pain -Sec to malignancy; fentany/ hydocodone TID.  Stable refill patches.   #Poor p.o. intake/weight loss-multifactorial including progressive malignancy-recommend IV fluids; dexamethasone today  # hypokalemia-2.2;-continue potassium supplementation.  IV 40 mEq supplementation today.  Magnesium 1 g-mag 1.5.  #A long discussion with patient and mother regarding my concerns of poor tolerance to therapy/declining performance status-PS-2-3;-severe weight loss which predict poor response to therapy.  Discussed that we might have to hold further therapy if patient's clinical status does not improve; or continues get worse.  Patient/mother-looking for treatment options-we will make a referral to [facility] as per the preference.  Awaiting palliative care evaluation today.  # DISPOSITION: # IV KCL 40 meq today; mag 1 gm; HOLD OPDIVO; Dex 8 gm IVP; IVF 1lit over 1 h.   # referral to [facility]/second opinion re: colon cancer ASAP.   # [facility] in 1 week ; IVF over 1 hour; IV KCL 40 meq today; mag 1 gm.    # follow up in 2 weeks; labs;MD-  Cbc/cmp/magCEA;KCL 40 meq IV/mag 1 gm;IVF over 1 hour; possible opdivo -Dr.B

## 2019-07-03 LAB — CEA: CEA: 50.7 ng/mL — ABNORMAL HIGH (ref 0.0–4.7)

## 2019-07-05 ENCOUNTER — Other Ambulatory Visit: Payer: Self-pay

## 2019-07-05 ENCOUNTER — Inpatient Hospital Stay
Admission: EM | Admit: 2019-07-05 | Discharge: 2019-07-12 | DRG: 871 | Disposition: A | Discharge status: Skilled Nursing Facility | Payer: BC Managed Care – PPO | Attending: Internal Medicine | Admitting: Internal Medicine

## 2019-07-05 ENCOUNTER — Emergency Department: Payer: BC Managed Care – PPO

## 2019-07-05 ENCOUNTER — Encounter: Payer: Self-pay | Admitting: Radiology

## 2019-07-05 DIAGNOSIS — R778 Other specified abnormalities of plasma proteins: Secondary | ICD-10-CM | POA: Diagnosis present

## 2019-07-05 DIAGNOSIS — E870 Hyperosmolality and hypernatremia: Secondary | ICD-10-CM | POA: Diagnosis present

## 2019-07-05 DIAGNOSIS — E876 Hypokalemia: Secondary | ICD-10-CM | POA: Diagnosis present

## 2019-07-05 DIAGNOSIS — Z681 Body mass index (BMI) 19 or less, adult: Secondary | ICD-10-CM

## 2019-07-05 DIAGNOSIS — Z7401 Bed confinement status: Secondary | ICD-10-CM

## 2019-07-05 DIAGNOSIS — Z8261 Family history of arthritis: Secondary | ICD-10-CM

## 2019-07-05 DIAGNOSIS — K750 Abscess of liver: Secondary | ICD-10-CM | POA: Diagnosis present

## 2019-07-05 DIAGNOSIS — K1230 Oral mucositis (ulcerative), unspecified: Secondary | ICD-10-CM | POA: Diagnosis present

## 2019-07-05 DIAGNOSIS — Z515 Encounter for palliative care: Secondary | ICD-10-CM

## 2019-07-05 DIAGNOSIS — D6959 Other secondary thrombocytopenia: Secondary | ICD-10-CM | POA: Diagnosis present

## 2019-07-05 DIAGNOSIS — Z800 Family history of malignant neoplasm of digestive organs: Secondary | ICD-10-CM

## 2019-07-05 DIAGNOSIS — Z66 Do not resuscitate: Secondary | ICD-10-CM | POA: Diagnosis not present

## 2019-07-05 DIAGNOSIS — R188 Other ascites: Secondary | ICD-10-CM | POA: Diagnosis present

## 2019-07-05 DIAGNOSIS — Z79891 Long term (current) use of opiate analgesic: Secondary | ICD-10-CM

## 2019-07-05 DIAGNOSIS — D649 Anemia, unspecified: Secondary | ICD-10-CM | POA: Diagnosis present

## 2019-07-05 DIAGNOSIS — E43 Unspecified severe protein-calorie malnutrition: Secondary | ICD-10-CM | POA: Diagnosis present

## 2019-07-05 DIAGNOSIS — C787 Secondary malignant neoplasm of liver and intrahepatic bile duct: Secondary | ICD-10-CM | POA: Diagnosis present

## 2019-07-05 DIAGNOSIS — K8689 Other specified diseases of pancreas: Secondary | ICD-10-CM | POA: Diagnosis present

## 2019-07-05 DIAGNOSIS — D696 Thrombocytopenia, unspecified: Secondary | ICD-10-CM | POA: Diagnosis present

## 2019-07-05 DIAGNOSIS — Z20822 Contact with and (suspected) exposure to covid-19: Secondary | ICD-10-CM | POA: Diagnosis present

## 2019-07-05 DIAGNOSIS — Z79899 Other long term (current) drug therapy: Secondary | ICD-10-CM

## 2019-07-05 DIAGNOSIS — Z833 Family history of diabetes mellitus: Secondary | ICD-10-CM

## 2019-07-05 DIAGNOSIS — I10 Essential (primary) hypertension: Secondary | ICD-10-CM | POA: Diagnosis present

## 2019-07-05 DIAGNOSIS — G620 Drug-induced polyneuropathy: Secondary | ICD-10-CM | POA: Diagnosis present

## 2019-07-05 DIAGNOSIS — T451X5A Adverse effect of antineoplastic and immunosuppressive drugs, initial encounter: Secondary | ICD-10-CM | POA: Diagnosis present

## 2019-07-05 DIAGNOSIS — Z8249 Family history of ischemic heart disease and other diseases of the circulatory system: Secondary | ICD-10-CM

## 2019-07-05 DIAGNOSIS — A4181 Sepsis due to Enterococcus: Principal | ICD-10-CM | POA: Diagnosis present

## 2019-07-05 DIAGNOSIS — C772 Secondary and unspecified malignant neoplasm of intra-abdominal lymph nodes: Secondary | ICD-10-CM | POA: Diagnosis present

## 2019-07-05 DIAGNOSIS — X58XXXA Exposure to other specified factors, initial encounter: Secondary | ICD-10-CM | POA: Diagnosis present

## 2019-07-05 DIAGNOSIS — C785 Secondary malignant neoplasm of large intestine and rectum: Secondary | ICD-10-CM | POA: Diagnosis present

## 2019-07-05 DIAGNOSIS — K1231 Oral mucositis (ulcerative) due to antineoplastic therapy: Secondary | ICD-10-CM

## 2019-07-05 DIAGNOSIS — Z87891 Personal history of nicotine dependence: Secondary | ICD-10-CM

## 2019-07-05 DIAGNOSIS — Z85038 Personal history of other malignant neoplasm of large intestine: Secondary | ICD-10-CM

## 2019-07-05 DIAGNOSIS — Z923 Personal history of irradiation: Secondary | ICD-10-CM

## 2019-07-05 DIAGNOSIS — C182 Malignant neoplasm of ascending colon: Secondary | ICD-10-CM | POA: Diagnosis present

## 2019-07-05 DIAGNOSIS — R7881 Bacteremia: Secondary | ICD-10-CM | POA: Diagnosis present

## 2019-07-05 DIAGNOSIS — C7951 Secondary malignant neoplasm of bone: Secondary | ICD-10-CM | POA: Diagnosis present

## 2019-07-05 DIAGNOSIS — A419 Sepsis, unspecified organism: Secondary | ICD-10-CM

## 2019-07-05 DIAGNOSIS — D630 Anemia in neoplastic disease: Secondary | ICD-10-CM | POA: Diagnosis present

## 2019-07-05 DIAGNOSIS — F329 Major depressive disorder, single episode, unspecified: Secondary | ICD-10-CM | POA: Diagnosis present

## 2019-07-05 DIAGNOSIS — Z9049 Acquired absence of other specified parts of digestive tract: Secondary | ICD-10-CM

## 2019-07-05 DIAGNOSIS — Z8042 Family history of malignant neoplasm of prostate: Secondary | ICD-10-CM

## 2019-07-05 LAB — CBC WITH DIFFERENTIAL/PLATELET
Abs Immature Granulocytes: 0.12 10*3/uL — ABNORMAL HIGH (ref 0.00–0.07)
Basophils Absolute: 0.1 10*3/uL (ref 0.0–0.1)
Basophils Relative: 1 %
Eosinophils Absolute: 0.1 10*3/uL (ref 0.0–0.5)
Eosinophils Relative: 1 %
HCT: 34.4 % — ABNORMAL LOW (ref 36.0–46.0)
Hemoglobin: 10.5 g/dL — ABNORMAL LOW (ref 12.0–15.0)
Immature Granulocytes: 2 %
Lymphocytes Relative: 3 %
Lymphs Abs: 0.2 10*3/uL — ABNORMAL LOW (ref 0.7–4.0)
MCH: 24.9 pg — ABNORMAL LOW (ref 26.0–34.0)
MCHC: 30.5 g/dL (ref 30.0–36.0)
MCV: 81.7 fL (ref 80.0–100.0)
Monocytes Absolute: 0.1 10*3/uL (ref 0.1–1.0)
Monocytes Relative: 1 %
Neutro Abs: 5.7 10*3/uL (ref 1.7–7.7)
Neutrophils Relative %: 92 %
Platelets: 81 10*3/uL — ABNORMAL LOW (ref 150–400)
RBC: 4.21 MIL/uL (ref 3.87–5.11)
RDW: 21.1 % — ABNORMAL HIGH (ref 11.5–15.5)
WBC: 6.2 10*3/uL (ref 4.0–10.5)
nRBC: 0.3 % — ABNORMAL HIGH (ref 0.0–0.2)

## 2019-07-05 LAB — COMPREHENSIVE METABOLIC PANEL
ALT: 43 U/L (ref 0–44)
AST: 84 U/L — ABNORMAL HIGH (ref 15–41)
Albumin: 2.4 g/dL — ABNORMAL LOW (ref 3.5–5.0)
Alkaline Phosphatase: 531 U/L — ABNORMAL HIGH (ref 38–126)
Anion gap: 22 — ABNORMAL HIGH (ref 5–15)
BUN: 29 mg/dL — ABNORMAL HIGH (ref 6–20)
CO2: 25 mmol/L (ref 22–32)
Calcium: 8.6 mg/dL — ABNORMAL LOW (ref 8.9–10.3)
Chloride: 101 mmol/L (ref 98–111)
Creatinine, Ser: 1.06 mg/dL — ABNORMAL HIGH (ref 0.44–1.00)
GFR calc Af Amer: >60 mL/min (ref >60)
GFR calc non Af Amer: 59 mL/min — ABNORMAL LOW (ref >60)
Glucose, Bld: 118 mg/dL — ABNORMAL HIGH (ref 70–99)
Potassium: 2.8 mmol/L — ABNORMAL LOW (ref 3.5–5.1)
Sodium: 148 mmol/L — ABNORMAL HIGH (ref 135–145)
Total Bilirubin: 1.4 mg/dL — ABNORMAL HIGH (ref 0.3–1.2)
Total Protein: 7.4 g/dL (ref 6.5–8.1)

## 2019-07-05 LAB — PROTIME-INR
INR: 1.6 — ABNORMAL HIGH (ref 0.8–1.2)
Prothrombin Time: 18.7 seconds — ABNORMAL HIGH (ref 11.4–15.2)

## 2019-07-05 LAB — TROPONIN I (HIGH SENSITIVITY)
Troponin I (High Sensitivity): 104 ng/L
Troponin I (High Sensitivity): 113 ng/L (ref <18)

## 2019-07-05 LAB — LACTIC ACID, PLASMA
Lactic Acid, Venous: 8.9 mmol/L (ref 0.5–1.9)
Lactic Acid, Venous: 8.8 mmol/L (ref 0.5–1.9)

## 2019-07-05 LAB — RESPIRATORY PANEL BY RT PCR (FLU A&B, COVID)
Influenza A by PCR: NEGATIVE
Influenza B by PCR: NEGATIVE
SARS Coronavirus 2 by RT PCR: NEGATIVE

## 2019-07-05 LAB — LIPASE, BLOOD: Lipase: 18 U/L (ref 11–51)

## 2019-07-05 MED ORDER — ONDANSETRON HCL 4 MG/2ML IJ SOLN
4.0000 mg | Freq: Once | INTRAMUSCULAR | Status: AC
  Administered 2019-07-05: 4 mg via INTRAVENOUS
  Filled 2019-07-05: qty 2

## 2019-07-05 MED ORDER — SODIUM CHLORIDE 0.9 % IV BOLUS
1000.0000 mL | Freq: Once | INTRAVENOUS | Status: AC
  Administered 2019-07-05: 1000 mL via INTRAVENOUS

## 2019-07-05 MED ORDER — SODIUM CHLORIDE 0.9 % IV BOLUS
1000.0000 mL | Freq: Once | INTRAVENOUS | Status: AC
  Administered 2019-07-06: 1000 mL via INTRAVENOUS

## 2019-07-05 MED ORDER — IOHEXOL 300 MG/ML  SOLN
60.0000 mL | Freq: Once | INTRAMUSCULAR | Status: AC | PRN
  Administered 2019-07-05: 75 mL via INTRAVENOUS

## 2019-07-05 MED ORDER — MORPHINE SULFATE (PF) 4 MG/ML IV SOLN
4.0000 mg | Freq: Once | INTRAVENOUS | Status: AC
  Administered 2019-07-05: 4 mg via INTRAVENOUS
  Filled 2019-07-05: qty 1

## 2019-07-05 MED ORDER — METRONIDAZOLE IN NACL 5-0.79 MG/ML-% IV SOLN
500.0000 mg | Freq: Once | INTRAVENOUS | Status: AC
  Administered 2019-07-05: 500 mg via INTRAVENOUS
  Filled 2019-07-05: qty 100

## 2019-07-05 MED ORDER — POTASSIUM CHLORIDE 10 MEQ/100ML IV SOLN
10.0000 meq | Freq: Once | INTRAVENOUS | Status: AC
  Administered 2019-07-05: 10 meq via INTRAVENOUS
  Filled 2019-07-05: qty 100

## 2019-07-05 MED ORDER — SODIUM CHLORIDE 0.9 % IV SOLN
2.0000 g | Freq: Once | INTRAVENOUS | Status: AC
  Administered 2019-07-05: 2 g via INTRAVENOUS
  Filled 2019-07-05: qty 2

## 2019-07-05 MED ORDER — VANCOMYCIN HCL 750 MG/150ML IV SOLN
750.0000 mg | Freq: Once | INTRAVENOUS | Status: AC
  Administered 2019-07-06: 750 mg via INTRAVENOUS
  Filled 2019-07-05: qty 150

## 2019-07-05 MED ORDER — SODIUM CHLORIDE 0.9 % IV BOLUS (SEPSIS)
250.0000 mL | Freq: Once | INTRAVENOUS | Status: AC
  Administered 2019-07-05: 250 mL via INTRAVENOUS

## 2019-07-05 MED ORDER — VANCOMYCIN HCL IN DEXTROSE 1-5 GM/200ML-% IV SOLN: 1000.0000 mg | Freq: Once | INTRAVENOUS | Status: DC

## 2019-07-05 NOTE — ED Triage Notes (Signed)
Pt arrived via EMS from home with reports of 2 days of increased weakness and decreased appetite with poor PO intake.  Pt also reports episodes of diarrhea.  Pt has hx of colon CA and HTN, did not take meds today.

## 2019-07-05 NOTE — Progress Notes (Signed)
PHARMACY -  BRIEF ANTIBIOTIC NOTE   Pharmacy has received consult(s) for Vancomycin, Cefepime from an ED provider.  The patient's profile has been reviewed for ht/wt/allergies/indication/available labs.    One time order(s) placed for Cefepime 2 gm IV X 1 and Vancomycin 750 mg IV X 1   Further antibiotics/pharmacy consults should be ordered by admitting physician if indicated.                       Thank you, Konstance Happel D 07/05/2019  9:33 PM

## 2019-07-05 NOTE — ED Notes (Signed)
Pt reports that she is coughing, vomiting and having pain, however, when asked previously pt denied any pain.

## 2019-07-05 NOTE — ED Provider Notes (Signed)
-----------------------------------------   11:40 PM on 07/05/2019 -----------------------------------------  Blood pressure (!) 114/91, pulse 97, temperature (!) 102.3 F (39.1 C), temperature source Oral, resp. rate 18, height 4\' 10"  (1.473 m), weight 33.8 kg, SpO2 100 %.  Assuming care from Dr. Cherylann Banas.  In short, Nancy Clark is a 59 y.o. female with a chief complaint of Weakness .  Refer to the original H&P for additional details.  The current plan of care is to await return phone call from [facility] service for potential transfer.  ----------------------------------------- 12:30 AM on 07/06/2019 -----------------------------------------  Case discussed with Dr. Delsa Bern in the ICU at [facility].  He states that there is not currently an ICU bed available for the patient but that there should be in the morning and she will be placed on a wait list.  Patient was offered transfer to either [facility] or [facility] for further treatment, however she declines both of these and wishes to wait for a spot at [facility].  She and her husband understand that there is risk associated with this, however it is reasonable given that she remained stable at this time.  If her condition were to change, we will seek transfer to [facility] or [facility], which patient and husband agree with.  ----------------------------------------- 6:46 AM on 07/06/2019 -----------------------------------------  Patient's vital signs remained improved, lactate is now downtrending to normal.  Now that patient does not appear to require ICU level of care, we will reengage with [facility] for potential transfer.  If placement at [facility] is not available, we will need to either engage with [facility] or [facility] for transfer.  Patient turned over to oncoming provider pending transfer.    Blake Divine, MD 07/06/19 [phone]

## 2019-07-05 NOTE — ED Provider Notes (Signed)
[facility] Emergency Department Provider Note ____________________________________________   First MD Initiated Contact with Patient 07/05/19 2018     (approximate)  I have reviewed the triage vital signs and the nursing notes.   HISTORY  Chief Complaint Weakness  Level 5 caveat: History of present illness limited due to poor historian   HPI Nancy Clark is a 59 y.o. female with PMH as noted below including metastatic colon cancer who presents from home with increased weakness as well as diarrhea and abdominal pain over the last few days.  She also reports some vomiting and cough.   Past Medical History:  Diagnosis Date  . Chicken pox   . Colon cancer ([place])    Partial colectomy 07/2015 + chemo tx's  . Hypertension   . Hypokalemia   . Menopause    > 5 yrs  . Peripheral neuropathy due to chemotherapy ([place])    bi lat feet    Patient Active Problem List   Diagnosis Date Noted  . Biliary obstruction 05/03/2019  . Metastases to the liver ([place]) 05/03/2019  . History of colon cancer 05/03/2019  . Hyperbilirubinemia 05/03/2019  . Elevated liver enzymes 05/03/2019  . Thrombocytopenia ([place]) 05/03/2019  . Normocytic anemia 05/03/2019  . Essential hypertension 05/03/2019  . Liver mass, right lobe 11/06/2018  . Goals of care, counseling/discussion 10/19/2018  . Cancer, metastatic to bone ([place]) 07/31/2016  . Encounter for antineoplastic chemotherapy 12/07/2015  . Malnutrition of moderate degree 10/13/2015  . Gastrointestinal bleeding 10/12/2015  . Pancytopenia due to chemotherapy ([place]) 10/12/2015  . Hypokalemia 10/12/2015  . Cancer of ascending colon metastatic to intra-abdominal lymph node ([place]) 09/06/2015  . Preventative health care 06/08/2015    Past Surgical History:  Procedure Laterality Date  . BILIARY STENT PLACEMENT  05/05/2019   Procedure: BILIARY STENT PLACEMENT;  Surgeon: Ronnette Juniper, MD;  Location: [place];  Service:  Gastroenterology;;  . BREAST BIOPSY Right    2007? pt unsure done by [facility]  . COLONOSCOPY    . COLONOSCOPY WITH PROPOFOL N/A 10/14/2015   Procedure: COLONOSCOPY WITH PROPOFOL;  Surgeon: Hulen Luster, MD;  Location: [facility] ENDOSCOPY;  Service: Endoscopy;  Laterality: N/A;  . COLOSTOMY REVISION Right 08/09/2015   Procedure: COLON RESECTION RIGHT;  Surgeon: Florene Glen, MD;  Location: [facility] ORS;  Service: General;  Laterality: Right;  . ECTOPIC PREGNANCY SURGERY    . ERCP N/A 05/05/2019   Procedure: ENDOSCOPIC RETROGRADE CHOLANGIOPANCREATOGRAPHY (ERCP);  Surgeon: Ronnette Juniper, MD;  Location: [place];  Service: Gastroenterology;  Laterality: N/A;  . IR BONE TUMOR(S)RF ABLATION  05/24/2018  . IR KYPHO LUMBAR INC FX REDUCE BONE BX UNI/BIL CANNULATION INC/IMAGING  05/24/2018  . IR RADIOLOGIST EVAL & MGMT  05/08/2018  . IR RADIOLOGIST EVAL & MGMT  06/18/2018  . IR RADIOLOGIST EVAL & MGMT  07/31/2018  . IR RADIOLOGIST EVAL & MGMT  12/19/2018  . IR RADIOLOGIST EVAL & MGMT  02/27/2019  . PANCREATIC STENT PLACEMENT  05/05/2019   Procedure: PANCREATIC STENT PLACEMENT;  Surgeon: Ronnette Juniper, MD;  Location[facility] ENDOSCOPY;  Service: Gastroenterology;;  . Sol Passer PLACEMENT N/A 09/01/2015   Procedure: INSERTION PORT-A-CATH;  Surgeon: Florene Glen, MD;  Location: [facility] ORS;  Service: General;  Laterality: N/A;  . RADIOLOGY WITH ANESTHESIA N/A 11/06/2018   Procedure: CT-MICROWAVE THERMAL ABLATION/LIVER;  Surgeon: Corrie Mckusick, DO;  Location: [facility] ORS;  Service: Anesthesiology;  Laterality: N/A;  . REMOVAL OF STONES  05/05/2019   Procedure: REMOVAL OF STONES;  Surgeon:  Ronnette Juniper, MD;  Location: [place];  Service: Gastroenterology;;  . SHOULDER SURGERY Left   . SPHINCTEROTOMY  05/05/2019   Procedure: SPHINCTEROTOMY;  Surgeon: Ronnette Juniper, MD;  Location: [facility] ENDOSCOPY;  Service: Gastroenterology;;    Prior to Admission medications   Medication Sig Start Date End Date Taking? Authorizing Provider    acetaminophen (TYLENOL) 325 MG tablet Take 2 tablets (650 mg total) by mouth every 6 (six) hours as needed for mild pain (or Fever >/= 101). 10/14/15   Gouru, Illene Silver, MD  amLODipine (NORVASC) 5 MG tablet Take 2 tablets (10 mg total) by mouth daily. 05/06/19   Guilford Shi, MD  Calcium Carbonate-Vitamin D (CALCIUM 600/VITAMIN D PO) Take 1 tablet by mouth every evening.     [provider]  cloNIDine (CATAPRES - DOSED IN MG/24 HR) 0.1 mg/24hr patch Place 1 patch (0.1 mg total) onto the skin every Monday at 6 PM. 05/12/19   Gu[place]ford Shi, MD  diphenoxylate-atropine (LOMOTIL) 2.5-0.025 MG tablet Take 1 tablet by mouth 4 (four) times daily as needed for diarrhea or loose stools. Take it along with immodium 04/21/19   Cammie Sickle, MD  dronabinol (MARINOL) 5 MG capsule Take 1 capsule (5 mg total) by mouth 2 (two) times daily before lunch and supper. Patient not taking: Reported on 06/25/2019 12/19/18   Verlon Au, NP  DULoxetine (CYMBALTA) 20 MG capsule TAKE 2 CAPSULES DAILY 05/19/19   Cammie Sickle, MD  feeding supplement (BOOST HIGH PROTEIN) LIQD Take 1 Container by mouth daily.     [provider]  fentaNYL (DURAGESIC) 50 MCG/HR Place 1 patch onto the skin every 3 (three) days. 07/02/19   Cammie Sickle, MD  ferrous sulfate (FERROUSUL) 325 (65 FE) MG tablet Take 1 tablet (325 mg total) by mouth daily with breakfast. Patient not taking: Reported on 06/25/2019 10/14/15   Nicholes Mango, MD  gabapentin (NEURONTIN) 300 MG capsule Take 1-2 capsules (300-600 mg total) by mouth See admin instructions. Take 1 capsule (300 mg) by mouth daily in the morning & take 2 capsules (600 mg) by mouth at night. 12/30/18   Cammie Sickle, MD  HYDROcodone-acetaminophen (NORCO/VICODIN) 5-325 MG tablet Take 1 tablet by mouth every 8 (eight) hours as needed (pain.). 06/11/19   Cammie Sickle, MD  lidocaine-prilocaine (EMLA) cream Apply 1 application topically as needed.  Apply generously over the Mediport 45 minutes prior to chemotherapy. Patient taking differently: Apply 1 application topically as needed (prior to chemotherapy.).  05/27/18   Cammie Sickle, MD  loperamide (IMODIUM) 2 MG capsule Take 2 mg by mouth 4 (four) times daily as needed for diarrhea or loose stools.     [provider]  magic mouthwash w/lidocaine SOLN Take 5 mLs by mouth 4 (four) times daily. 07/02/19   Cammie Sickle, MD  Multiple Vitamin (MULTIVITAMIN WITH MINERALS) TABS tablet Take 1 tablet by mouth daily.    [provider]  ondansetron (ZOFRAN) 8 MG tablet One pill every 8 hours as needed for nausea/vomitting. Patient taking differently: Take 8 mg by mouth every 8 (eight) hours as needed for nausea or vomiting.  09/09/18   Cammie Sickle, MD  potassium chloride SA (KLOR-CON) 20 MEQ tablet Take 1 tablet (20 mEq total) by mouth 3 (three) times daily. 06/11/19   Cammie Sickle, MD  prochlorperazine (COMPAZINE) 10 MG tablet Take 1 tablet (10 mg total) by mouth every 6 (six) hours as needed for nausea or vomiting. 05/27/18  Cammie Sickle, MD  STIVARGA 40 MG tablet TAKE 2 TABLETS (80 MG TOTAL) BY MOUTH DAILY WITH BREAKFAST. TAKE FOR 3 WEEKS; AND 1 WEEK OFF. TAKE WITH LOW FAT MEAL. DO NOT START UNTIL OK BY MD. 06/26/19   Cammie Sickle, MD    Allergies Patient has no known allergies.  Family History  Problem Relation Age of Onset  . Hypertension Mother   . Arthritis Father   . Prostate cancer Father   . Diabetes Maternal Grandmother   . Colon cancer Maternal Grandfather        colon    Social History Social History   Tobacco Use  . Smoking status: Former Smoker    Packs/day: 0.25    Years: 2.00    Pack years: 0.50  . Smokeless tobacco: Never Used  . Tobacco comment: recently quit  Substance Use Topics  . Alcohol use: No    Alcohol/week: 0.0 standard drinks  . Drug use: No    Review of Systems Level 5 caveat: Review  of systems limited due to poor historian Constitutional: For fever. Cardiovascular: Denies chest pain. Respiratory: Denies shortness of breath.  Positive for cough. Gastrointestinal: No positive for vomiting and diarrhea.  Skin: Negative for rash. Neurological: Negative for headache.   ____________________________________________   PHYSICAL EXAM:  VITAL SIGNS: ED Triage Vitals  Enc Vitals Group     BP 07/05/19 2010 128/80     Pulse Rate 07/05/19 2010 (!) 128     Resp 07/05/19 2010 (!) 24     Temp 07/05/19 2010 (!) 102.3 F (39.1 C)     Temp Source 07/05/19 2010 Oral     SpO2 07/05/19 2010 100 %     Weight 07/05/19 2007 74 lb 8.3 oz (33.8 kg)     Height 07/05/19 2007 4\' 10"  (1.473 m)     Head Circumference --      Peak Flow --      Pain Score 07/05/19 2007 0     Pain Loc --      Pain Edu? --      Excl. in [place]? --     Constitutional: Alert, very weak and frail appearing. Eyes: Conjunctivae are normal.  No scleral icterus. Head: Atraumatic. Nose: No congestion/rhinnorhea. Mouth/Throat: Mucous membranes are dry.   Neck: Normal range of motion.  Cardiovascular: Tachycardic, regular rhythm. Grossly normal heart sounds.  Good peripheral circulation. Respiratory: Normal respiratory effort.  No retractions. Lungs CTAB. Gastrointestinal: Soft with mild diffuse tenderness.  No distention.  Genitourinary: No flank tenderness. Musculoskeletal:  Extremities warm and well perfused.  Neurologic: Motor intact in all extremities. Skin:  Skin is warm and dry. No rash noted. Psychiatric: Calm and cooperative.  ____________________________________________   LABS (all labs ordered are listed, but only abnormal results are displayed)  Labs Reviewed  COMPREHENSIVE METABOLIC PANEL - Abnormal; Notable for the following components:      Result Value   Sodium 148 (*)    Potassium 2.8 (*)    Glucose, Bld 118 (*)    BUN 29 (*)    Creatinine, Ser 1.06 (*)    Calcium 8.6 (*)    Albumin  2.4 (*)    AST 84 (*)    Alkaline Phosphatase 531 (*)    Total Bilirubin 1.4 (*)    GFR calc non Af Amer 59 (*)    Anion gap 22 (*)    All other components within normal limits  LACTIC ACID, PLASMA - Abnormal; Notable for the  following components:   Lactic Acid, Venous 8.9 (*)    All other components within normal limits  LACTIC ACID, PLASMA - Abnormal; Notable for the following components:   Lactic Acid, Venous 8.8 (*)    All other components within normal limits  CBC WITH DIFFERENTIAL/PLATELET - Abnormal; Notable for the following components:   Hemoglobin 10.5 (*)    HCT 34.4 (*)    MCH 24.9 (*)    RDW 21.1 (*)    Platelets 81 (*)    nRBC 0.3 (*)    Lymphs Abs 0.2 (*)    Abs Immature Granulocytes 0.12 (*)    All other components within normal limits  PROTIME-INR - Abnormal; Notable for the following components:   Prothrombin Time 18.7 (*)    INR 1.6 (*)    All other components within normal limits  TROPONIN I (HIGH SENSITIVITY) - Abnormal; Notable for the following components:   Troponin I (High Sensitivity) 104 (*)    All other components within normal limits  TROPONIN I (HIGH SENSITIVITY) - Abnormal; Notable for the following components:   Troponin I (High Sensitivity) 113 (*)    All other components within normal limits  RESPIRATORY PANEL BY RT PCR (FLU A&B, COVID)  CULTURE, BLOOD (ROUTINE X 2)  CULTURE, BLOOD (ROUTINE X 2)  LIPASE, BLOOD  URINALYSIS, COMPLETE (UACMP) WITH MICROSCOPIC   ____________________________________________  EKG  ED ECG REPORT I, Arta Silence, the attending physician, personally viewed and interpreted this ECG.  Date: 07/05/2019 EKG Time: 2031 Rate: 124 Rhythm: Sinus tachycardia (incorrectly recorded by the machine as atrial fibrillation) QRS Axis: normal Intervals: normal ST/T Wave abnormalities: Nonspecific ST abnormalities diffusely Narrative Interpretation: Nonspecific abnormalities with no evidence of acute  ischemia  ____________________________________________  RADIOLOGY  CXR: Right lung base opacity CT abdomen: 8.5 cm hepatic abscess ____________________________________________   PROCEDURES  Procedure(s) performed: No  Procedures  Critical Care performed: Yes  CRITICAL CARE Performed by: Arta Silence   Total critical care time: 60 minutes  Critical care time was exclusive of separately billable procedures and treating other patients.  Critical care was necessary to treat or prevent imminent or life-threatening deterioration.  Critical care was time spent personally by me on the following activities: development of treatment plan with patient and/or surrogate as well as nursing, discussions with consultants, evaluation of patient's response to treatment, examination of patient, obtaining history from patient or surrogate, ordering and performing treatments and interventions, ordering and review of laboratory studies, ordering and review of radiographic studies, pulse oximetry and re-evaluation of patient's condition. ____________________________________________   INITIAL IMPRESSION / ASSESSMENT AND PLAN / ED COURSE  Pertinent labs & imaging results that were available during my care of the patient were reviewed by me and considered in my medical decision making (see chart for details).  59 year old female with PMH as noted above including metastatic colon cancer presents with several days of generalized weakness and diarrhea, as well as a fever and poor p.o. intake.  Additionally, in the ED the patient now reports some abdominal pain and vomiting.  I reviewed the past medical records in epic.  The patient follows at the cancer center here and was last seen on 2/10.  She has stage IV colon cancer and is receiving chemotherapy, but at the last visit it was held due to poor tolerance of the therapy, weight loss, and overall declining clinical status.  She received potassium  and magnesium repletion.  On exam, the patient is extremely weak and frail appearing.  She is tachycardic and febrile with otherwise normal vital signs.  She does have some mild bilateral abdominal tenderness but no peritoneal signs.  Presentation is consistent with sepsis.  Differential for the source includes colitis or enteritis, other GI source, UTI, or less likely with possible pneumonia or COVID-19.  We will give fluids, antipyretic, labs per sepsis protocol, obtain chest x-ray and UA, and reassess.  I anticipate that the patient will need a CT of the abdomen as well.  ----------------------------------------- 11:45 PM on 07/05/2019 -----------------------------------------  Lab work-up was consistent with sepsis, with elevated lactate and troponin.  The patient's vital signs have improved after fluids.  Based on the lactic acid and her clinical status I ordered broad-spectrum antibiotics.  I proceeded with a CT of the abdomen which reveals a large liver abscess.  I discussed the case with Dr. Hampton Abbot from general surgery who recommends transfer to a tertiary care center.  Based on discussion with the patient and her husband who is now here, they would prefer transfer to [facility].  I contacted the transfer center to initiate transfer.  I discussed with the transfer center coordinator and I am awaiting callback from the physician.  I signed the patient out to the oncoming physician Dr. Charna Archer.  _______________________  Nancy Clark was evaluated in Emergency Department on 07/05/2019 for the symptoms described in the history of present illness. She was evaluated in the context of the global COVID-19 pandemic, which necessitated consideration that the patient might be at risk for infection with the SARS-CoV-2 virus that causes COVID-19. Institutional protocols and algorithms that pertain to the evaluation of patients at risk for COVID-19 are in a state of rapid change based on information released  by regulatory bodies including the CDC and federal and state organizations. These policies and algorithms were followed during the patient's care in the ED.  ____________________________________________   FINAL CLINICAL IMPRESSION(S) / ED DIAGNOSES  Final diagnoses:  Liver abscess  Sepsis without acute organ dysfunction, due to unspecified organism [facility])      NEW MEDICATIONS STARTED DURING THIS VISIT:  New Prescriptions   No medications on file     Note:  This document was prepared using Dragon voice recognition software and may include unintentional dictation errors.    Arta Silence, MD 07/05/19 2348

## 2019-07-06 ENCOUNTER — Inpatient Hospital Stay: Payer: BC Managed Care – PPO

## 2019-07-06 ENCOUNTER — Encounter: Payer: Self-pay | Admitting: Internal Medicine

## 2019-07-06 DIAGNOSIS — C772 Secondary and unspecified malignant neoplasm of intra-abdominal lymph nodes: Secondary | ICD-10-CM | POA: Diagnosis present

## 2019-07-06 DIAGNOSIS — R778 Other specified abnormalities of plasma proteins: Secondary | ICD-10-CM

## 2019-07-06 DIAGNOSIS — C787 Secondary malignant neoplasm of liver and intrahepatic bile duct: Secondary | ICD-10-CM | POA: Diagnosis present

## 2019-07-06 DIAGNOSIS — A419 Sepsis, unspecified organism: Secondary | ICD-10-CM | POA: Diagnosis present

## 2019-07-06 DIAGNOSIS — I10 Essential (primary) hypertension: Secondary | ICD-10-CM | POA: Diagnosis present

## 2019-07-06 DIAGNOSIS — T451X5A Adverse effect of antineoplastic and immunosuppressive drugs, initial encounter: Secondary | ICD-10-CM | POA: Diagnosis present

## 2019-07-06 DIAGNOSIS — R7881 Bacteremia: Secondary | ICD-10-CM | POA: Diagnosis not present

## 2019-07-06 DIAGNOSIS — B961 Klebsiella pneumoniae [K. pneumoniae] as the cause of diseases classified elsewhere: Secondary | ICD-10-CM | POA: Diagnosis not present

## 2019-07-06 DIAGNOSIS — Z8249 Family history of ischemic heart disease and other diseases of the circulatory system: Secondary | ICD-10-CM | POA: Diagnosis not present

## 2019-07-06 DIAGNOSIS — Z9049 Acquired absence of other specified parts of digestive tract: Secondary | ICD-10-CM | POA: Diagnosis not present

## 2019-07-06 DIAGNOSIS — Z8042 Family history of malignant neoplasm of prostate: Secondary | ICD-10-CM | POA: Diagnosis not present

## 2019-07-06 DIAGNOSIS — C7951 Secondary malignant neoplasm of bone: Secondary | ICD-10-CM | POA: Diagnosis present

## 2019-07-06 DIAGNOSIS — B9689 Other specified bacterial agents as the cause of diseases classified elsewhere: Secondary | ICD-10-CM | POA: Diagnosis not present

## 2019-07-06 DIAGNOSIS — X58XXXA Exposure to other specified factors, initial encounter: Secondary | ICD-10-CM | POA: Diagnosis present

## 2019-07-06 DIAGNOSIS — Z8261 Family history of arthritis: Secondary | ICD-10-CM | POA: Diagnosis not present

## 2019-07-06 DIAGNOSIS — Z515 Encounter for palliative care: Secondary | ICD-10-CM | POA: Diagnosis not present

## 2019-07-06 DIAGNOSIS — B952 Enterococcus as the cause of diseases classified elsewhere: Secondary | ICD-10-CM | POA: Diagnosis not present

## 2019-07-06 DIAGNOSIS — K8689 Other specified diseases of pancreas: Secondary | ICD-10-CM | POA: Diagnosis present

## 2019-07-06 DIAGNOSIS — E870 Hyperosmolality and hypernatremia: Secondary | ICD-10-CM | POA: Diagnosis present

## 2019-07-06 DIAGNOSIS — K750 Abscess of liver: Secondary | ICD-10-CM | POA: Diagnosis present

## 2019-07-06 DIAGNOSIS — E43 Unspecified severe protein-calorie malnutrition: Secondary | ICD-10-CM | POA: Diagnosis present

## 2019-07-06 DIAGNOSIS — C182 Malignant neoplasm of ascending colon: Secondary | ICD-10-CM | POA: Diagnosis present

## 2019-07-06 DIAGNOSIS — Z66 Do not resuscitate: Secondary | ICD-10-CM | POA: Diagnosis not present

## 2019-07-06 DIAGNOSIS — A4181 Sepsis due to Enterococcus: Secondary | ICD-10-CM | POA: Diagnosis present

## 2019-07-06 DIAGNOSIS — D6959 Other secondary thrombocytopenia: Secondary | ICD-10-CM | POA: Diagnosis present

## 2019-07-06 DIAGNOSIS — Z87891 Personal history of nicotine dependence: Secondary | ICD-10-CM | POA: Diagnosis not present

## 2019-07-06 DIAGNOSIS — E876 Hypokalemia: Secondary | ICD-10-CM | POA: Diagnosis present

## 2019-07-06 DIAGNOSIS — C785 Secondary malignant neoplasm of large intestine and rectum: Secondary | ICD-10-CM | POA: Diagnosis present

## 2019-07-06 DIAGNOSIS — Z681 Body mass index (BMI) 19 or less, adult: Secondary | ICD-10-CM | POA: Diagnosis not present

## 2019-07-06 DIAGNOSIS — Z20822 Contact with and (suspected) exposure to covid-19: Secondary | ICD-10-CM | POA: Diagnosis present

## 2019-07-06 DIAGNOSIS — R188 Other ascites: Secondary | ICD-10-CM | POA: Diagnosis present

## 2019-07-06 DIAGNOSIS — R7989 Other specified abnormal findings of blood chemistry: Secondary | ICD-10-CM | POA: Diagnosis present

## 2019-07-06 DIAGNOSIS — D630 Anemia in neoplastic disease: Secondary | ICD-10-CM | POA: Diagnosis present

## 2019-07-06 LAB — BLOOD CULTURE ID PANEL (REFLEXED)
Acinetobacter baumannii: NOT DETECTED
Candida albicans: NOT DETECTED
Candida glabrata: NOT DETECTED
Candida krusei: NOT DETECTED
Candida parapsilosis: NOT DETECTED
Candida tropicalis: NOT DETECTED
Carbapenem resistance: NOT DETECTED
Enterobacter cloacae complex: DETECTED — AB
Enterobacteriaceae species: DETECTED — AB
Enterococcus species: DETECTED — AB
Escherichia coli: NOT DETECTED
Haemophilus influenzae: NOT DETECTED
Klebsiella oxytoca: DETECTED — AB
Klebsiella pneumoniae: NOT DETECTED
Listeria monocytogenes: NOT DETECTED
Neisseria meningitidis: NOT DETECTED
Proteus species: NOT DETECTED
Pseudomonas aeruginosa: NOT DETECTED
Serratia marcescens: NOT DETECTED
Staphylococcus aureus (BCID): NOT DETECTED
Staphylococcus species: NOT DETECTED
Streptococcus agalactiae: NOT DETECTED
Streptococcus pneumoniae: NOT DETECTED
Streptococcus pyogenes: NOT DETECTED
Streptococcus species: NOT DETECTED
Vancomycin resistance: NOT DETECTED

## 2019-07-06 LAB — TYPE AND SCREEN
ABO/RH(D): O POS
Antibody Screen: NEGATIVE

## 2019-07-06 LAB — URINALYSIS, COMPLETE (UACMP) WITH MICROSCOPIC
Bacteria, UA: NONE SEEN
Bilirubin Urine: NEGATIVE
Glucose, UA: NEGATIVE mg/dL
Hgb urine dipstick: NEGATIVE
Ketones, ur: NEGATIVE mg/dL
Leukocytes,Ua: NEGATIVE
Nitrite: NEGATIVE
Protein, ur: NEGATIVE mg/dL
Specific Gravity, Urine: 1.025 (ref 1.005–1.030)
Squamous Epithelial / HPF: NONE SEEN (ref 0–5)
pH: 5 (ref 5.0–8.0)

## 2019-07-06 LAB — LACTIC ACID, PLASMA
Lactic Acid, Venous: 2.7 mmol/L (ref 0.5–1.9)
Lactic Acid, Venous: 1.8 mmol/L (ref 0.5–1.9)
Lactic Acid, Venous: 1.1 mmol/L (ref 0.5–1.9)

## 2019-07-06 LAB — TROPONIN I (HIGH SENSITIVITY): Troponin I (High Sensitivity): 56 ng/L — ABNORMAL HIGH (ref <18)

## 2019-07-06 LAB — APTT: aPTT: 48 seconds — ABNORMAL HIGH (ref 24–36)

## 2019-07-06 LAB — MRSA PCR SCREENING: MRSA by PCR: NEGATIVE

## 2019-07-06 MED ORDER — FENTANYL CITRATE (PF) 100 MCG/2ML IJ SOLN
INTRAMUSCULAR | Status: AC
  Filled 2019-07-06: qty 4

## 2019-07-06 MED ORDER — GABAPENTIN 300 MG PO CAPS: 300.0000 mg | ORAL_CAPSULE | ORAL | Status: DC

## 2019-07-06 MED ORDER — SODIUM CHLORIDE 0.9 % IV SOLN: INTRAVENOUS | Status: DC

## 2019-07-06 MED ORDER — CHLORHEXIDINE GLUCONATE CLOTH 2 % EX PADS
6.0000 | MEDICATED_PAD | Freq: Every day | CUTANEOUS | Status: DC
  Administered 2019-07-06 – 2019-07-12 (×5): 6 via TOPICAL

## 2019-07-06 MED ORDER — MIDAZOLAM HCL 5 MG/5ML IJ SOLN
INTRAMUSCULAR | Status: AC | PRN
  Administered 2019-07-06 (×2): 0.5 mg via INTRAVENOUS

## 2019-07-06 MED ORDER — POTASSIUM CHLORIDE 10 MEQ/100ML IV SOLN
10.0000 meq | INTRAVENOUS | Status: AC
  Administered 2019-07-06: 10 meq via INTRAVENOUS
  Filled 2019-07-06 (×2): qty 100

## 2019-07-06 MED ORDER — PANTOPRAZOLE SODIUM 40 MG PO TBEC
40.0000 mg | DELAYED_RELEASE_TABLET | Freq: Every day | ORAL | Status: DC
  Administered 2019-07-07 – 2019-07-11 (×4): 40 mg via ORAL
  Filled 2019-07-06 (×5): qty 1

## 2019-07-06 MED ORDER — METRONIDAZOLE IN NACL 5-0.79 MG/ML-% IV SOLN
500.0000 mg | Freq: Four times a day (QID) | INTRAVENOUS | Status: DC
  Administered 2019-07-06: 500 mg via INTRAVENOUS
  Filled 2019-07-06: qty 100

## 2019-07-06 MED ORDER — MORPHINE SULFATE (PF) 2 MG/ML IV SOLN
0.5000 mg | INTRAVENOUS | Status: DC | PRN
  Administered 2019-07-06: 0.5 mg via INTRAVENOUS
  Filled 2019-07-06 (×2): qty 1

## 2019-07-06 MED ORDER — SODIUM CHLORIDE 0.9% FLUSH
5.0000 mL | Freq: Three times a day (TID) | INTRAVENOUS | Status: DC
  Administered 2019-07-06 – 2019-07-12 (×17): 5 mL

## 2019-07-06 MED ORDER — MIDAZOLAM HCL 5 MG/5ML IJ SOLN
INTRAMUSCULAR | Status: AC
  Filled 2019-07-06: qty 5

## 2019-07-06 MED ORDER — PIPERACILLIN-TAZOBACTAM 3.375 G IVPB
3.3750 g | Freq: Three times a day (TID) | INTRAVENOUS | Status: DC
  Administered 2019-07-06 – 2019-07-10 (×12): 3.375 g via INTRAVENOUS
  Filled 2019-07-06 (×11): qty 50

## 2019-07-06 MED ORDER — CALCIUM CARBONATE-VITAMIN D 500-200 MG-UNIT PO TABS
1.0000 | ORAL_TABLET | Freq: Every day | ORAL | Status: DC
  Administered 2019-07-07: 1 via ORAL
  Filled 2019-07-06 (×3): qty 1

## 2019-07-06 MED ORDER — SODIUM CHLORIDE 0.9 % IV SOLN: 1.0000 g | Freq: Two times a day (BID) | INTRAVENOUS | Status: DC

## 2019-07-06 MED ORDER — DULOXETINE HCL 20 MG PO CPEP
40.0000 mg | ORAL_CAPSULE | Freq: Every day | ORAL | Status: DC
  Administered 2019-07-07 – 2019-07-09 (×3): 40 mg via ORAL
  Filled 2019-07-06 (×7): qty 2

## 2019-07-06 MED ORDER — FENTANYL CITRATE (PF) 100 MCG/2ML IJ SOLN
INTRAMUSCULAR | Status: AC | PRN
  Administered 2019-07-06 (×2): 25 ug via INTRAVENOUS

## 2019-07-06 MED ORDER — LOPERAMIDE HCL 2 MG PO CAPS: 2.0000 mg | ORAL_CAPSULE | Freq: Four times a day (QID) | ORAL | Status: DC | PRN

## 2019-07-06 MED ORDER — GABAPENTIN 300 MG PO CAPS
600.0000 mg | ORAL_CAPSULE | Freq: Every day | ORAL | Status: DC
  Administered 2019-07-07 – 2019-07-09 (×2): 600 mg via ORAL
  Filled 2019-07-06 (×4): qty 2

## 2019-07-06 MED ORDER — DIPHENOXYLATE-ATROPINE 2.5-0.025 MG PO TABS: 1.0000 | ORAL_TABLET | Freq: Four times a day (QID) | ORAL | Status: DC | PRN

## 2019-07-06 MED ORDER — ADULT MULTIVITAMIN W/MINERALS CH
1.0000 | ORAL_TABLET | Freq: Every day | ORAL | Status: DC
  Administered 2019-07-07 – 2019-07-10 (×4): 1 via ORAL
  Filled 2019-07-06 (×5): qty 1

## 2019-07-06 MED ORDER — OXYCODONE HCL 5 MG PO TABS
5.0000 mg | ORAL_TABLET | Freq: Four times a day (QID) | ORAL | Status: DC | PRN
  Administered 2019-07-09 – 2019-07-11 (×3): 5 mg via ORAL
  Filled 2019-07-06 (×3): qty 1

## 2019-07-06 MED ORDER — MAGNESIUM SULFATE IN D5W 1-5 GM/100ML-% IV SOLN
1.0000 g | Freq: Once | INTRAVENOUS | Status: AC
  Administered 2019-07-06: 1 g via INTRAVENOUS
  Filled 2019-07-06: qty 100

## 2019-07-06 MED ORDER — GABAPENTIN 300 MG PO CAPS
300.0000 mg | ORAL_CAPSULE | Freq: Every day | ORAL | Status: DC
  Administered 2019-07-07 – 2019-07-10 (×4): 300 mg via ORAL
  Filled 2019-07-06 (×5): qty 1

## 2019-07-06 MED ORDER — POTASSIUM CHLORIDE CRYS ER 20 MEQ PO TBCR: 40.0000 meq | EXTENDED_RELEASE_TABLET | ORAL | Status: AC

## 2019-07-06 MED ORDER — MORPHINE SULFATE (PF) 2 MG/ML IV SOLN
1.0000 mg | INTRAVENOUS | Status: DC | PRN
  Administered 2019-07-06 – 2019-07-08 (×2): 1 mg via INTRAVENOUS
  Filled 2019-07-06: qty 1

## 2019-07-06 MED ORDER — CALCIUM CARBONATE-VITAMIN D 600-400 MG-UNIT PO CHEW: 1.0000 | CHEWABLE_TABLET | Freq: Every evening | ORAL | Status: DC

## 2019-07-06 MED ORDER — ONDANSETRON HCL 4 MG/2ML IJ SOLN
4.0000 mg | Freq: Three times a day (TID) | INTRAMUSCULAR | Status: DC | PRN
  Administered 2019-07-06 – 2019-07-12 (×5): 4 mg via INTRAVENOUS
  Filled 2019-07-06 (×5): qty 2

## 2019-07-06 MED ORDER — IBUPROFEN 400 MG PO TABS: 200.0000 mg | ORAL_TABLET | Freq: Four times a day (QID) | ORAL | Status: DC | PRN

## 2019-07-06 NOTE — ED Notes (Signed)
Pt's husband updated on new lab values and new transfer plan.

## 2019-07-06 NOTE — ED Notes (Signed)
Called Phil from [place] for transfer  [phone]

## 2019-07-06 NOTE — ED Notes (Signed)
Pt given warm blanket.

## 2019-07-06 NOTE — ED Notes (Signed)
Pt placed on bed pan, peri care provided.  Family at bedside. Call bell within reach, stretcher locked in lowest position, warm blanket provided

## 2019-07-06 NOTE — Procedures (Signed)
Interventional Radiology Procedure Note  Procedure: CT Guided Drainage of hepatic abscess  Complications: None  Estimated Blood Loss: < 10 mL  Findings: 10 Fr drain placed in right lobe hepatic abscess with return of brown, purulent fluid. Fluid sample sent for culture analysis. Drain attached to suction bulb drainage.  Will follow.  Venetia Night. Kathlene Cote, M.D Pager:  [phone]

## 2019-07-06 NOTE — ED Provider Notes (Signed)
7:47 AM Assumed care for off going team.   Blood pressure (!) 103/57, pulse 77, temperature 98.2 F (36.8 C), temperature source Oral, resp. rate 19, height 4\' 10"  (1.473 m), weight 33.8 kg, SpO2 97 %.  See their HPI for full report but in brief patient pending [facility] ICU placement.  However prior to Dr. Grover Canavan end of shift he had called the [facility] transfer center to try to get her to a stepdown bed given her vital signs and lactate are improving.  Reevaluated patient vitals are now stable.  Patient denies any pain.  Discussed with [facility] Dr. Aline Brochure who states that patient has not established oncology patient there that she was just looking for a second opinion.  Given this he states that he declines patient for transfer.  Given patient is followed by [facility] and our surgical team did not feel comfortable with this will discuss with the oncology team at [facility] given the concern that she needed a tertiary care center.  8:17 AM discussed with Dr.  Benay Spice from [facility] oncology who stated that if patient needs to come here to [facility] for management of this that he would recommend it being admitted to the surgical or the hospital team.  However he suggested that we rediscussed with our teams here because this may be able to be managed here.  We will rediscuss with our GI team and our surgery team to make sure there is not any intervention that they can offer her here.  If not then will talk to the surgical or hospital team over at [facility].  Discussed with Dr. Hampton Abbot who stated that from a surgical standpoint he would not do anything but to talk to IR.  He thinks that the biliary system looks normal and that she would not need a emergent ERCP.  D/w Dr. Marius Ditch who stated that she does not feel like patient needs an emergent ERCP.  Patient needs to have the abscess drained first.  If she does need an ERCP then Dr. Allen Norris should be around this week to help do it.  Will discuss with IR to see if they can  put a drain in.  Discussed with IR here Dr. Laural Roes who stated that he could put the drain in her.  He has other more emergent procedures to do today but if she becomes septic again they can let him know that he could come in today more emergently.  Otherwise he can plan to do it tomorrow.  We will keep n.p.o. at midnight as long as she is stable.  Discussed with our hospitalist here who will admit patient.  At time of admission patient appears very well.  Patient's vitals are stable and lactate has normalized.   PROCEDURES  Procedure(s) performed (including Critical Care):  .Critical Care Performed by: Vanessa Gary, MD Authorized by: Vanessa [place], MD   Critical care provider statement:    Critical care time (minutes):  31   Critical care was necessary to treat or prevent imminent or life-threatening deterioration of the following conditions:  Sepsis   Critical care was time spent personally by me on the following activities:  Discussions with consultants, evaluation of patient's response to treatment, examination of patient, ordering and performing treatments and interventions, ordering and review of laboratory studies, ordering and review of radiographic studies, pulse oximetry, re-evaluation of patient's condition, obtaining history from patient or surrogate and review of old charts  Vanessa Castlewood, MD 07/06/19 [phone]

## 2019-07-06 NOTE — ED Notes (Signed)
Admitting MD at bedside.

## 2019-07-06 NOTE — Consult Note (Signed)
Pharmacy Antibiotic Note  Nancy Clark is a 59 y.o. female admitted on 07/05/2019 with weakness, diarrhea and abdominal pain. She has PMH of colon cancer. Patient found to have liver abscess and positive blood cultures.   BCx: 4 of 4 positive. 2 of 4 GPC and 4 of 4 GNR.  BCID: Klebsiella oxytoca, Enterobacter cloacae complex, and Enterococcus species     Pharmacy has been consulted for Zosyn dosing.  Plan: Start Zosyn 3.375 IV IE every 8 hours   Height: 4\' 10"  (147.3 cm) Weight: 74 lb 8.3 oz (33.8 kg) IBW/kg (Calculated) : 40.9  Temp (24hrs), Avg:100.3 F (37.9 C), Min:98.2 F (36.8 C), Max:102.3 F (39.1 C)  Recent Labs  Lab 07/02/19 0851 07/05/19 2034 07/05/19 2248 07/06/19 0220 07/06/19 0620  WBC 20.5* 6.2  --   --   --   CREATININE 0.76 1.06*  --   --   --   LATICACIDVEN  --  8.9* 8.8* 2.7* 1.8    Estimated Creatinine Clearance: 31.6 mL/min (A) (by C-G formula based on SCr of 1.06 mg/dL (H)).    No Known Allergies  Antimicrobials this admission: 2/14 vancomycin >> x 1 2/13 cefepime/Flagyl>> 2/14 2/14 Zosyn>>   Microbiology results: 2/13  BCx: GPC, GNR 2/13  BCID: Klebsiella oxytoca, Enterobacter cloacae complex, and Enterococcus species  Thank you for allowing pharmacy to be a part of this patient's care.  Pernell Dupre, PharmD, BCPS Clinical Pharmacist 07/06/2019 9:48 AM

## 2019-07-06 NOTE — H&P (Signed)
History and Physical    Nancy Clark [id number] DOB: 1960/06/09 DOA: 07/05/2019  Referring MD/NP/PA:   PCP: Patient, No Pcp Per   Patient coming from:  The patient is coming from home.  At baseline, pt is independent for most of ADL.        Chief Complaint: Abdominal pain, fever  HPI: Nancy Clark is a 59 y.o. female with medical history significant of hypertension, depression, metastasized colon cancer on chemotherapy, peripheral neuropathy due to chemotherapy, thrombocytopenia, GI bleeding, biliary obstruction, who presents with abdominal pain and fever.  Patient states that she has been worsening abdominal pain in the past several days, which is located in the central abdomen, constant, moderate, sharp, nonradiating.  It is associated with nausea, but no vomiting.  She also has diarrhea in the past several days, approximately 3 times of diarrhea each day.  She has fever and chills.  She has mild dry cough, but no shortness of breath and chest pain.  No symptoms of UTI or unilateral weakness.  ED Course: pt was found to have WBC 6.2, lactic acid 8.8, 2.7, 1.8, INR 1.6, troponin 113 -->56, lipase 18, abnormal liver function (ALP 531, AST 84, ALT 43, total bilirubin 1.4), negative urinalysis, negative RVP for Covid test, potassium 2.8, creatinine 1.06, BUN 29, temperature 102.3, soft blood pressure, tachycardia, oxygen saturation 95% on room air.  Chest x-ray showed small patchy opacity in the right base.  CT abdomen/pelvis that showed 8.5 cm of liver abscess. Blood culture is 4 of 4 positive: 2 of 4 GPC and 4 of 4 GNR. BCID: Klebsiella oxytoca, Enterobacter cloacae complex, and Enterococcus species. Pt is admitted to stepdown as inpatient.  Dr. Kathlene Cote will follow IR was consulted for drainage.  # CT-abd/pelvs showed: 1. 8.5 cm abscess in the posterior right hepatic lobe, as described above. Abscess directly transgresses the anterior right hepatic capsule with associated perihepatic  fluid and small volume abdominopelvic ascites. Communication between the abscess and biliary tree is suspected.  2. Two suspected hepatic lesions which are progressive from the prior and favor metastases in this patient status post right hemicolectomy for colon cancer.  3. Abnormal soft tissue in the porta hepatis along the celiac axis, new/progressive from the prior, raising concern for tumor. This appearance is less typical of colon cancer and raises concern for hepatobiliary tumor such as pancreatic cancer or cholangiocarcinoma.  4. Associated new dilatation of the pancreatic duct. Associated intraluminal soft tissue in the mid/distal common duct stent, nonspecific.   Review of Systems:   General: has fevers, chills, no body weight gain, has poor appetite, has fatigue HEENT: no blurry vision, hearing changes or sore throat Respiratory: no dyspnea, has coughing, no wheezing CV: no chest pain, no palpitations GI: has nausea,  abdominal pain, diarrhea, no constipation vomiting, GU: no dysuria, burning on urination, increased urinary frequency, hematuria  Ext: no leg edema Neuro: no unilateral weakness, numbness, or tingling, no vision change or hearing loss Skin: no rash, no skin tear. MSK: No muscle spasm, no deformity, no limitation of range of movement in spin Heme: No easy bruising.  Travel history: No recent long distant travel.  Allergy: No Known Allergies  Past Medical History:  Diagnosis Date  . Chicken pox   . Colon cancer ([place])    Partial colectomy 07/2015 + chemo tx's  . Hypertension   . Hypokalemia   . Menopause    > 5 yrs  . Peripheral neuropathy due to chemotherapy ([place])  bi lat feet    Past Surgical History:  Procedure Laterality Date  . BILIARY STENT PLACEMENT  05/05/2019   Procedure: BILIARY STENT PLACEMENT;  Surgeon: Ronnette Juniper, MD;  Location: [place];  Service: Gastroenterology;;  . BREAST BIOPSY Right    2007? pt unsure done by [facility]    . COLONOSCOPY    . COLONOSCOPY WITH PROPOFOL N/A 10/14/2015   Procedure: COLONOSCOPY WITH PROPOFOL;  Surgeon: Hulen Luster, MD;  Location: [facility] ENDOSCOPY;  Service: Endoscopy;  Laterality: N/A;  . COLOSTOMY REVISION Right 08/09/2015   Procedure: COLON RESECTION RIGHT;  Surgeon: Florene Glen, MD;  Location: [facility] ORS;  Service: General;  Laterality: Right;  . ECTOPIC PREGNANCY SURGERY    . ERCP N/A 05/05/2019   Procedure: ENDOSCOPIC RETROGRADE CHOLANGIOPANCREATOGRAPHY (ERCP);  Surgeon: Ronnette Juniper, MD;  Location: [place];  Service: Gastroenterology;  Laterality: N/A;  . IR BONE TUMOR(S)RF ABLATION  05/24/2018  . IR KYPHO LUMBAR INC FX REDUCE BONE BX UNI/BIL CANNULATION INC/IMAGING  05/24/2018  . IR RADIOLOGIST EVAL & MGMT  05/08/2018  . IR RADIOLOGIST EVAL & MGMT  06/18/2018  . IR RADIOLOGIST EVAL & MGMT  07/31/2018  . IR RADIOLOGIST EVAL & MGMT  12/19/2018  . IR RADIOLOGIST EVAL & MGMT  02/27/2019  . PANCREATIC STENT PLACEMENT  05/05/2019   Procedure: PANCREATIC STENT PLACEMENT;  Surgeon: Ronnette Juniper, MD;  Location: [facility] ENDOSCOPY;  Service: Gastroenterology;;  . Sol Passer PLACEMENT N/A 09/01/2015   Procedure: INSERTION PORT-A-CATH;  Surgeon: Florene Glen, MD;  Location: [facility] ORS;  Service: General;  Laterality: N/A;  . RADIOLOGY WITH ANESTHESIA N/A 11/06/2018   Procedure: CT-MICROWAVE THERMAL ABLATION/LIVER;  Surgeon: Corrie Mckusick, DO;  Location: [facility];  Service: Anesthesiology;  Laterality: N/A;  . REMOVAL OF STONES  05/05/2019   Procedure: REMOVAL OF STONES;  Surgeon: Ronnette Juniper, MD;  Location: [place];  Service: Gastroenterology;;  . SHOULDER SURGERY Left   . SPHINCTEROTOMY  05/05/2019   Procedure: SPHINCTEROTOMY;  Surgeon: Ronnette Juniper, MD;  Location: [facility] ENDOSCOPY;  Service: Gastroenterology;;    Social History:  reports that she has quit smoking. She has a 0.50 pack-year smoking history. She has never used smokeless tobacco. She reports that she does not drink alcohol or use  drugs.  Family History:  Family History  Problem Relation Age of Onset  . Hypertension Mother   . Arthritis Father   . Prostate cancer Father   . Diabetes Maternal Grandmother   . Colon cancer Maternal Grandfather        colon     Prior to Admission medications   Medication Sig Start Date End Date Taking? Authorizing Provider  acetaminophen (TYLENOL) 325 MG tablet Take 2 tablets (650 mg total) by mouth every 6 (six) hours as needed for mild pain (or Fever >/= 101). 10/14/15   Gouru, Illene Silver, MD  amLODipine (NORVASC) 5 MG tablet Take 2 tablets (10 mg total) by mouth daily. 05/06/19   Guilford Shi, MD  Calcium Carbonate-Vitamin D (CALCIUM 600/VITAMIN D PO) Take 1 tablet by mouth every evening.     [provider]  cloNIDine (CATAPRES - DOSED IN MG/24 HR) 0.1 mg/24hr patch Place 1 patch (0.1 mg total) onto the skin every Monday at 6 PM. 05/12/19   Guilford Shi, MD  diphenoxylate-atropine (LOMOTIL) 2.5-0.025 MG tablet Take 1 tablet by mouth 4 (four) times daily as needed for diarrhea or loose stools. Take it along with immodium 04/21/19   Cammie Sickle, MD  dronabinol (MARINOL) 5 MG  capsule Take 1 capsule (5 mg total) by mouth 2 (two) times daily before lunch and supper. Patient not taking: Reported on 06/25/2019 12/19/18   Verlon Au, NP  DULoxetine (CYMBALTA) 20 MG capsule TAKE 2 CAPSULES DAILY 05/19/19   Cammie Sickle, MD  feeding supplement (BOOST HIGH PROTEIN) LIQD Take 1 Container by mouth daily.     [provider]  fentaNYL (DURAGESIC) 50 MCG/HR Place 1 patch onto the skin every 3 (three) days. 07/02/19   Cammie Sickle, MD  ferrous sulfate (FERROUSUL) 325 (65 FE) MG tablet Take 1 tablet (325 mg total) by mouth daily with breakfast. Patient not taking: Reported on 06/25/2019 10/14/15   Nicholes Mango, MD  gabapentin (NEURONTIN) 300 MG capsule Take 1-2 capsules (300-600 mg total) by mouth See admin instructions. Take 1 capsule (300 mg) by  mouth daily in the morning & take 2 capsules (600 mg) by mouth at night. 12/30/18   Cammie Sickle, MD  HYDROcodone-acetaminophen (NORCO/VICODIN) 5-325 MG tablet Take 1 tablet by mouth every 8 (eight) hours as needed (pain.). 06/11/19   Cammie Sickle, MD  lidocaine-prilocaine (EMLA) cream Apply 1 application topically as needed. Apply generously over the Mediport 45 minutes prior to chemotherapy. Patient taking differently: Apply 1 application topically as needed (prior to chemotherapy.).  05/27/18   Cammie Sickle, MD  loperamide (IMODIUM) 2 MG capsule Take 2 mg by mouth 4 (four) times daily as needed for diarrhea or loose stools.     [provider]  magic mouthwash w/lidocaine SOLN Take 5 mLs by mouth 4 (four) times daily. 07/02/19   Cammie Sickle, MD  Multiple Vitamin (MULTIVITAMIN WITH MINERALS) TABS tablet Take 1 tablet by mouth daily.    [provider]  ondansetron (ZOFRAN) 8 MG tablet One pill every 8 hours as needed for nausea/vomitting. Patient taking differently: Take 8 mg by mouth every 8 (eight) hours as needed for nausea or vomiting.  09/09/18   Cammie Sickle, MD  potassium chloride SA (KLOR-CON) 20 MEQ tablet Take 1 tablet (20 mEq total) by mouth 3 (three) times daily. 06/11/19   Cammie Sickle, MD  prochlorperazine (COMPAZINE) 10 MG tablet Take 1 tablet (10 mg total) by mouth every 6 (six) hours as needed for nausea or vomiting. 05/27/18   Cammie Sickle, MD  STIVARGA 40 MG tablet TAKE 2 TABLETS (80 MG TOTAL) BY MOUTH DAILY WITH BREAKFAST. TAKE FOR 3 WEEKS; AND 1 WEEK OFF. TAKE WITH LOW FAT MEAL. DO NOT START UNTIL OK BY MD. 06/26/19   Cammie Sickle, MD    Physical Exam: Vitals:   07/06/19 1458 07/06/19 1500 07/06/19 1515 07/06/19 1522  BP: 125/74 125/74 (!) 145/67 (!) 161/64  Pulse: 76 77 68 69  Resp: 15 (!) 27 20 (!) 25  Temp:    97.9 F (36.6 C)  TempSrc:    Oral  SpO2: 100% 100% 100% 100%  Weight:    35.5  kg  Height:    5\' 4"  (1.626 m)   General: Not in acute distress. Very thin body habitus HEENT:       Eyes: PERRL, EOMI, no scleral icterus.       ENT: No discharge from the ears and nose, no pharynx injection, no tonsillar enlargement.        Neck: No JVD, no bruit, no mass felt. Heme: No neck lymph node enlargement. Cardiac: S1/S2, RRR, No murmurs, No gallops or rubs. Respiratory: No rales, wheezing, rhonchi or  rubs. Chest wall: has Port-A in left upper chest GI: Soft, nondistended, has tenderness in central abdomen, no rebound pain, no organomegaly, BS present. GU: No hematuria Ext: No pitting leg edema bilaterally. 2+DP/PT pulse bilaterally. Musculoskeletal: No joint deformities, No joint redness or warmth, no limitation of ROM in spin. Skin: No rashes.  Neuro: Alert, oriented X3, cranial nerves II-XII grossly intact, moves all extremities normally.  Psych: Patient is not psychotic, no suicidal or hemocidal ideation.  Labs on Admission: I have personally reviewed following labs and imaging studies  CBC: Recent Labs  Lab 07/02/19 0851 07/05/19 2034  WBC 20.5* 6.2  NEUTROABS 17.1* 5.7  HGB 9.8* 10.5*  HCT 30.9* 34.4*  MCV 81.7 81.7  PLT 191 81*   Basic Metabolic Panel: Recent Labs  Lab 07/02/19 0851 07/05/19 2034  NA 141 148*  K 2.2* 2.8*  CL 99 101  CO2 28 25  GLUCOSE 159* 118*  BUN 10 29*  CREATININE 0.76 1.06*  CALCIUM 8.2* 8.6*   GFR: Estimated Creatinine Clearance: 33.2 mL/min (A) (by C-G formula based on SCr of 1.06 mg/dL (H)). Liver Function Tests: Recent Labs  Lab 07/02/19 0851 07/05/19 2034  AST 28 84*  ALT 18 43  ALKPHOS 200* 531*  BILITOT 1.2 1.4*  PROT 6.7 7.4  ALBUMIN 2.3* 2.4*   Recent Labs  Lab 07/05/19 2034  LIPASE 18   No results for input(s): AMMONIA in the last 168 hours. Coagulation Profile: Recent Labs  Lab 07/05/19 2034  INR 1.6*   Cardiac Enzymes: No results for input(s): CKTOTAL, CKMB, CKMBINDEX, TROPONINI in the last  168 hours. BNP (last 3 results) No results for input(s): PROBNP in the last 8760 hours. HbA1C: No results for input(s): HGBA1C in the last 72 hours. CBG: No results for input(s): GLUCAP in the last 168 hours. Lipid Profile: No results for input(s): CHOL, HDL, LDLCALC, TRIG, CHOLHDL, LDLDIRECT in the last 72 hours. Thyroid Function Tests: No results for input(s): TSH, T4TOTAL, FREET4, T3FREE, THYROIDAB in the last 72 hours. Anemia Panel: No results for input(s): VITAMINB12, FOLATE, FERRITIN, TIBC, IRON, RETICCTPCT in the last 72 hours. Urine analysis:    Component Value Date/Time   COLORURINE YELLOW (A) 07/05/2019 2349   APPEARANCEUR CLEAR (A) 07/05/2019 2349   LABSPEC 1.025 07/05/2019 2349   PHURINE 5.0 07/05/2019 2349   GLUCOSEU NEGATIVE 07/05/2019 2349   HGBUR NEGATIVE 07/05/2019 2349   BILIRUBINUR NEGATIVE 07/05/2019 2349   BILIRUBINUR Negative 06/03/2015 0849   KETONESUR NEGATIVE 07/05/2019 2349   PROTEINUR NEGATIVE 07/05/2019 2349   UROBILINOGEN 0.2 06/03/2015 0849   NITRITE NEGATIVE 07/05/2019 2349   LEUKOCYTESUR NEGATIVE 07/05/2019 2349   Sepsis Labs: @LABRCNTIP (procalcitonin:4,lacticidven:4) ) Recent Results (from the past 240 hour(s))  Culture, blood (Routine x 2)     Status: None (Preliminary result)   Collection Time: 07/05/19  8:30 PM   Specimen: BLOOD  Result Value Ref Range Status   Specimen Description BLOOD LEFT ANTECUBITAL  Final   Special Requests   Final    BOTTLES DRAWN AEROBIC AND ANAEROBIC Blood Culture adequate volume   Culture  Setup Time   Final    GRAM NEGATIVE RODS IN BOTH AEROBIC AND ANAEROBIC BOTTLES CRITICAL RESULT CALLED TO, READ BACK BY AND VERIFIED WITH: SHEEMA HALLAJI 07/06/19 0900 SJL Performed at [facility], [address] [place], [place] [address]    Culture GRAM NEGATIVE RODS  Final   Report Status PENDING  Incomplete  Respiratory Panel by RT PCR (Flu A&B, Covid) - Nasopharyngeal  Swab     Status: None   Collection  Time: 07/05/19  8:34 PM   Specimen: Nasopharyngeal Swab  Result Value Ref Range Status   SARS Coronavirus 2 by RT PCR NEGATIVE NEGATIVE Final    Comment: (NOTE) SARS-CoV-2 target nucleic acids are NOT DETECTED. The SARS-CoV-2 RNA is generally detectable in upper respiratoy specimens during the acute phase of infection. The lowest concentration of SARS-CoV-2 viral copies this assay can detect is 131 copies/mL. A negative result does not preclude SARS-Cov-2 infection and should not be used as the sole basis for treatment or other patient management decisions. A negative result may occur with  improper specimen collection/handling, submission of specimen other than nasopharyngeal swab, presence of viral mutation(s) within the areas targeted by this assay, and inadequate number of viral copies (<131 copies/mL). A negative result must be combined with clinical observations, patient history, and epidemiological information. The expected result is Negative. Fact Sheet for Patients:  PinkCheek.be Fact Sheet for Healthcare Providers:  GravelBags.it This test is not yet ap proved or cleared by the [place] FDA and  has been authorized for detection and/or diagnosis of SARS-CoV-2 by FDA under an Emergency Use Authorization (EUA). This EUA will remain  in effect (meaning this test can be used) for the duration of the COVID-19 declaration under Section 564(b)(1) of the Act, 21 U.S.C. section 360bbb-3(b)(1), unless the authorization is terminated or revoked sooner.    Influenza A by PCR NEGATIVE NEGATIVE Final   Influenza B by PCR NEGATIVE NEGATIVE Final    Comment: (NOTE) The Xpert Xpress SARS-CoV-2/FLU/RSV assay is intended as an aid in  the diagnosis of influenza from Nasopharyngeal swab specimens and  should not be used as a sole basis for treatment. Nasal washings and  aspirates are unacceptable for Xpert Xpress  SARS-CoV-2/FLU/RSV  testing. Fact Sheet for Patients: PinkCheek.be Fact Sheet for Healthcare Providers: GravelBags.it This test is not yet approved or cleared by the [place] FDA and  has been authorized for detection and/or diagnosis of SARS-CoV-2 by  FDA under an Emergency Use Authorization (EUA). This EUA will remain  in effect (meaning this test can be used) for the duration of the  Covid-19 declaration under Section 564(b)(1) of the Act, 21  U.S.C. section 360bbb-3(b)(1), unless the authorization is  terminated or revoked. Performed at [facility], [address] [place], [place] [address]   Culture, blood (Routine x 2)     Status: None (Preliminary result)   Collection Time: 07/05/19  8:49 PM   Specimen: BLOOD  Result Value Ref Range Status   Specimen Description   Final    BLOOD PORTA CATH Performed at [facility], [address] [place], [place] [address]    Special Requests   Final    BOTTLES DRAWN AEROBIC AND ANAEROBIC Blood Culture adequate volume Performed at [facility], [address] [place], [place] [address]    Culture  Setup Time   Final    GRAM POSITIVE COCCI GRAM NEGATIVE RODS IN BOTH AEROBIC AND ANAEROBIC BOTTLES CRITICAL RESULT CALLED TO, READ BACK BY AND VERIFIED WITH: SHEEMA HALLAJI 07/06/19 0900 SJL    Culture   Final    GRAM POSITIVE COCCI GRAM NEGATIVE RODS CULTURE REINCUBATED FOR BETTER GROWTH Performed at [facility], [address], [place], [place] [address]    Report Status PENDING  Incomplete  Blood Culture ID Panel (Reflexed)     Status: Abnormal   Collection Time: 07/05/19  8:49  PM  Result Value Ref Range Status   Enterococcus species DETECTED (A) NOT DETECTED Final    Comment: CRITICAL RESULT CALLED TO, READ BACK BY AND VERIFIED WITH: SHEEMA HALLAJI 07/06/19 0900 SJL    Vancomycin resistance NOT DETECTED NOT DETECTED Final    Listeria monocytogenes NOT DETECTED NOT DETECTED Final   Staphylococcus species NOT DETECTED NOT DETECTED Final   Staphylococcus aureus (BCID) NOT DETECTED NOT DETECTED Final   Streptococcus species NOT DETECTED NOT DETECTED Final   Streptococcus agalactiae NOT DETECTED NOT DETECTED Final   Streptococcus pneumoniae NOT DETECTED NOT DETECTED Final   Streptococcus pyogenes NOT DETECTED NOT DETECTED Final   Acinetobacter baumannii NOT DETECTED NOT DETECTED Final   Enterobacteriaceae species DETECTED (A) NOT DETECTED Final    Comment: CRITICAL RESULT CALLED TO, READ BACK BY AND VERIFIED WITH: SHEEMA HALLAJI 07/06/19 0900 SJL    Enterobacter cloacae complex DETECTED (A) NOT DETECTED Final    Comment: CRITICAL RESULT CALLED TO, READ BACK BY AND VERIFIED WITH: SHEEMA HALLAJI 07/06/19 0900 SJL    Escherichia coli NOT DETECTED NOT DETECTED Final   Klebsiella oxytoca DETECTED (A) NOT DETECTED Final    Comment: CRITICAL RESULT CALLED TO, READ BACK BY AND VERIFIED WITH: SHEEMA HALLAJI 07/06/19 0900 SJL    Klebsiella pneumoniae NOT DETECTED NOT DETECTED Final   Proteus species NOT DETECTED NOT DETECTED Final   Serratia marcescens NOT DETECTED NOT DETECTED Final   Carbapenem resistance NOT DETECTED NOT DETECTED Final   Haemophilus influenzae NOT DETECTED NOT DETECTED Final   Neisseria meningitidis NOT DETECTED NOT DETECTED Final   Pseudomonas aeruginosa NOT DETECTED NOT DETECTED Final   Candida albicans NOT DETECTED NOT DETECTED Final   Candida glabrata NOT DETECTED NOT DETECTED Final   Candida krusei NOT DETECTED NOT DETECTED Final   Candida parapsilosis NOT DETECTED NOT DETECTED Final   Candida tropicalis NOT DETECTED NOT DETECTED Final    Comment: Performed at [facility], [address] [place], [place] [address]  MRSA PCR Screening     Status: None   Collection Time: 07/06/19  3:27 PM   Specimen: Nasopharyngeal  Result Value Ref Range Status   MRSA by PCR NEGATIVE NEGATIVE  Final    Comment:        The GeneXpert MRSA Assay (FDA approved for NASAL specimens only), is one component of a comprehensive MRSA colonization surveillance program. It is not intended to diagnose MRSA infection nor to guide or monitor treatment for MRSA infections. Performed at [facility], [address] [place], [place] [address]      Radiological Exams on Admission: CT ABDOMEN PELVIS W CONTRAST  Result Date: 07/05/2019 CLINICAL DATA:  Abdominal pain, fever, decreased appetite/PO intake. History of colon cancer. EXAM: CT ABDOMEN AND PELVIS WITH CONTRAST TECHNIQUE: Multidetector CT imaging of the abdomen and pelvis was performed using the standard protocol following bolus administration of intravenous contrast. CONTRAST:  24mL OMNIPAQUE IOHEXOL 300 MG/ML  SOLN COMPARISON:  CT abdomen/pelvis dated 05/02/2019 and 02/24/2019. FINDINGS: Lower chest: Lung bases are clear. Hepatobiliary: 6.9 x 6.5 x 8.5 cm abscess with intraluminal gas in the posterior right hepatic lobe (series 2/image 9). This likely transgresses right anterior liver capsule (series 2/image 21; sagittal image 15). This region of the liver includes the prior ablation zone. Given intraluminal gas and the presence of an indwelling common duct stent, communication between the abscess in the biliary tree is suspected. At least one discrete 2.1 cm lesion is present in segment 4 B (series 2/image  23), previously 1.3 cm, favoring a metastasis. An additional 2.6 x 1.0 cm lesion anterior to the abscess is suspected in segment 5 (series 2/image 15), progressive from the prior, but difficult to definitively distinguish from the abscess. Mild pneumobilia in the left hepatic lobe. Mild periportal edema. Intraluminal soft tissue density within the mid/distal common duct stent (coronal image 22), nonspecific. Pancreas: Dilated pancreatic duct, measuring up to 7 mm in the proximal pancreatic tail (series 2/image 25), new. No definite  pancreatic atrophy. Spleen: Within normal limits. Adrenals/Urinary Tract: Adrenal glands are within normal limits. Kidneys are within normal limits.  No hydronephrosis. Thick-walled bladder, although underdistended. Stomach/Bowel: Stomach is within normal limits. No evidence of bowel obstruction. Status post right hemicolectomy with suture line in the right mid abdomen (series 2/image 38). Colon is decompressed. Vascular/Lymphatic: No evidence of abdominal aortic aneurysm. Atherosclerotic calcifications of the abdominal aorta and branch vessels. Abnormal soft tissue in the porta hepatis along the celiac axis (series 2/image 20), new/progressive from the prior, raising concern for tumor. This appearance is less typical of colon cancer and raises concern for hepatobiliary tumor such as pancreatic cancer or cholangiocarcinoma. Otherwise, no suspicious abdominopelvic lymphadenopathy. Reproductive: Uterus is within normal limits. Bilateral ovaries are unremarkable. Other: Small volume abdominopelvic ascites, including perihepatic fluid which directly communicates with the abscess, as described above. Musculoskeletal: Sclerosis involving the L2 vertebral body with prior vertebral augmentation. IMPRESSION: 8.5 cm abscess in the posterior right hepatic lobe, as described above. Abscess directly transgresses the anterior right hepatic capsule with associated perihepatic fluid and small volume abdominopelvic ascites. Communication between the abscess and biliary tree is suspected. Two suspected hepatic lesions which are progressive from the prior and favor metastases in this patient status post right hemicolectomy for colon cancer. Abnormal soft tissue in the porta hepatis along the celiac axis, new/progressive from the prior, raising concern for tumor. This appearance is less typical of colon cancer and raises concern for hepatobiliary tumor such as pancreatic cancer or cholangiocarcinoma. Associated new dilatation of the  pancreatic duct. Associated intraluminal soft tissue in the mid/distal common duct stent, nonspecific. Electronically Signed   By: Julian Hy M.D.   On: 07/05/2019 22:14   DG Chest Port 1 View  Result Date: 07/05/2019 CLINICAL DATA:  Sepsis, 2 days of weakness and decreased appetite with poor p.o. intake EXAM: PORTABLE CHEST 1 VIEW COMPARISON:  CT chest 11/21/2018 FINDINGS: There is a small patchy opacity in the right lung base which could reflect early infection versus confluence vessels and airways. No other airspace opacity is seen. No pneumothorax or effusion. Accessed left subclavian approach Port-A-Cath tip terminates in the lower SVC. The aorta is calcified. The remaining cardiomediastinal contours are unremarkable. No acute osseous or soft tissue abnormality. Postsurgical changes in the left shoulder are noted. Upper abdominal biliary stent is noted. IMPRESSION: Small patchy opacity in the right lung base which could reflect early infection versus confluence vessels and airways. Electronically Signed   By: Lovena Le M.D.   On: 07/05/2019 21:07   CT IMAGE GUIDED DRAINAGE BY PERCUTANEOUS CATHETER  Result Date: 07/06/2019 CLINICAL DATA:  Hepatic abscess and history of metastatic colon carcinoma. EXAM: CT GUIDED CATHETER DRAINAGE OF HEPATIC ABSCESS ANESTHESIA/SEDATION: 1.0 mg IV Versed 50 mcg IV Fentanyl Total Moderate Sedation Time:  19 minutes The patient's level of consciousness and physiologic status were continuously monitored during the procedure by Radiology nursing. PROCEDURE: The procedure, risks, benefits, and alternatives were explained to the patient. Questions regarding the procedure were encouraged and  answered. The patient understands and consents to the procedure. A time out was performed prior to initiating the procedure. The right abdominal wall was prepped with chlorhexidine in a sterile fashion, and a sterile drape was applied covering the operative field. A sterile gown and  sterile gloves were used for the procedure. Local anesthesia was provided with 1% Lidocaine. CT was performed through the upper abdomen in a supine position. Under CT guidance, an 18 gauge trocar needle was advanced into a right lobe hepatic abscess. Aspiration of fluid was performed. A guidewire was advanced into the collection and the needle removed. The tract was dilated and a 10 French percutaneous drainage catheter placed. Drainage catheter position was confirmed by CT. The drain was aspirated for additional fluid sample. The drainage catheter was then flushed with saline and connected to a suction bulb. The catheter was secured at the skin with a Prolene retention suture and StatLock device. COMPLICATIONS: None FINDINGS: The large right lobe hepatic abscess cavity yielded grossly purulent fluid. After placement of the 10 French drain, there is good return of brownish colored purulent fluid. IMPRESSION: CT-guided percutaneous catheter drainage of right lobe hepatic abscess yielding purulent fluid. A fluid sample was sent for culture analysis. A 10 French drainage catheter was placed and attached to suction bulb drainage. Electronically Signed   By: Aletta Edouard M.D.   On: 07/06/2019 15:14     EKG: Independently reviewed.  Sinus tachycardia, QTC 477   Assessment/Plan Principal Problem:   Liver abscess Active Problems:   Cancer of ascending colon metastatic to intra-abdominal lymph node (HCC)   Hypokalemia   Metastases to the liver (HCC)   Thrombocytopenia (HCC)   Normocytic anemia   Essential hypertension   Sepsis (HCC)   Elevated troponin   Bacteremia  Sepsis due to liver abscess and bacteremia: Patient meets criteria sepsis with fever, tachycardia, elevated lactic acid up to 8.8, which is normalized with IV fluid currently. Blood culture is 4 of 4 positive: 2 of 4 GPC and 4 of 4 GNR. BCID: Klebsiella oxytoca, Enterobacter cloacae complex, and Enterococcus species.  -will admit to  stepdown bed as inpatient -Dr. Kathlene Cote of IR was consulted for drainage -->s/p of drainage, culture is sent out -IV zosyn (patient received 1 dose of cefepime, vancomycin and Flagyl in ED) -Follow-up of blood culture -IV fluid: 2.25 L normal saline, then 75 cc/h  Cancer of ascending colon metastatic to intra-abdominal lymph node [facility]): Metastases to the liver. s/p of chemotherapy.  Patient is also on Stivarga, but did not take it in the past 48 hours. -f/u with onology  Hypokalemia: K= 2.8  on admission. - Repleted - Check Mg level - Give 1 g of magnesium sulfate  Thrombocytopenia ([facility]): Likely due to chemotherapy.  No active bleeding tendency -f/u by CBC  Normocytic anemia: Hemoglobin stable.  10.5 -Follow-up with CBC  Essential hypertension: -hold Bp meds due to softer blood pressure and sepsis  Elevated troponin: trop 104 -->113 -->56. No CP.  Likely due to demand ischemia. -Check A1c, FLP -Stop trending troponin   Inpatient status:  # Patient requires inpatient status due to high intensity of service, high risk for further deterioration and high frequency of surveillance required.  I certify that at the point of admission it is my clinical judgment that the patient will require inpatient hospital care spanning beyond 2 midnights from the point of admission.  . This patient has multiple chronic comorbidities including hypertension, depression, metastasized colon cancer on chemotherapy, peripheral neuropathy  due to chemotherapy, thrombocytopenia, GI bleeding, biliary obstruction . Now patient has presenting with sepsis due to liver abscess and bacteremia . The worrisome physical exam findings include abdominal tenderness . The initial radiographic and laboratory data are worrisome because of bacteremia, liver abscess by CT scan . Current medical needs: please see my assessment and plan . Predictability of an adverse outcome (risk): Patient has multiple comorbidities as listed  above. Now presents with sepsis due to liver abscess and bacteremia. Patient's presentation is highly complicated.  Patient is at high risk of deteriorating.  Will need to be treated in hospital for at least 2 days.           DVT ppx: SCD Code Status: Full code Family Communication:  Yes, patient's husband   at bed side Disposition Plan:  Anticipate discharge back to previous home environment Consults called:  Dr. Kathlene Cote of IR was consulted  Admission status:  SDU/inpation       Date of Service 07/06/2019    [facility]   If 7PM-7AM, please contact night-coverage [email or web address] 07/06/2019, 8:25 PM

## 2019-07-06 NOTE — Progress Notes (Signed)
PHARMACY - PHYSICIAN COMMUNICATION CRITICAL VALUE ALERT - BLOOD CULTURE IDENTIFICATION (BCID)  Nancy Clark is an 59 y.o. female who presented to [facility] on 07/05/2019 with a chief complaint of weakness, diarrhea and abdominal pain.   Assessment:   BCx: 4 of 4 positive. 2 of 4 GPC and 4 of 4 GNR.  BCID: Klebsiella oxytoca, Enterobacter cloacae complex, and Enterococcus species   Name of physician (or Provider) Contacted: Dr. Ivor Costa   Current antibiotics: Cefepime and Flagyl   Changes to prescribed antibiotics recommended:  Abx will be changed to Zosyn   Results for orders placed or performed during the hospital encounter of 07/05/19  Blood Culture ID Panel (Reflexed) (Collected: 07/05/2019  8:49 PM)  Result Value Ref Range   Enterococcus species DETECTED (A) NOT DETECTED   Vancomycin resistance NOT DETECTED NOT DETECTED   Listeria monocytogenes NOT DETECTED NOT DETECTED   Staphylococcus species NOT DETECTED NOT DETECTED   Staphylococcus aureus (BCID) NOT DETECTED NOT DETECTED   Streptococcus species NOT DETECTED NOT DETECTED   Streptococcus agalactiae NOT DETECTED NOT DETECTED   Streptococcus pneumoniae NOT DETECTED NOT DETECTED   Streptococcus pyogenes NOT DETECTED NOT DETECTED   Acinetobacter baumannii NOT DETECTED NOT DETECTED   Enterobacteriaceae species DETECTED (A) NOT DETECTED   Enterobacter cloacae complex DETECTED (A) NOT DETECTED   Escherichia coli NOT DETECTED NOT DETECTED   Klebsiella oxytoca DETECTED (A) NOT DETECTED   Klebsiella pneumoniae NOT DETECTED NOT DETECTED   Proteus species NOT DETECTED NOT DETECTED   Serratia marcescens NOT DETECTED NOT DETECTED   Carbapenem resistance NOT DETECTED NOT DETECTED   Haemophilus influenzae NOT DETECTED NOT DETECTED   Neisseria meningitidis NOT DETECTED NOT DETECTED   Pseudomonas aeruginosa NOT DETECTED NOT DETECTED   Candida albicans NOT DETECTED NOT DETECTED   Candida glabrata NOT DETECTED NOT DETECTED   Candida  krusei NOT DETECTED NOT DETECTED   Candida parapsilosis NOT DETECTED NOT DETECTED   Candida tropicalis NOT DETECTED NOT DETECTED    Pernell Dupre, PharmD, BCPS Clinical Pharmacist 07/06/2019 9:41 AM

## 2019-07-06 NOTE — ED Notes (Signed)
ED Provider at bedside. 

## 2019-07-06 NOTE — ED Notes (Signed)
Pt dry at this time, call bell within reach, stretcher locked in lowest position.

## 2019-07-07 DIAGNOSIS — Z515 Encounter for palliative care: Secondary | ICD-10-CM

## 2019-07-07 DIAGNOSIS — E876 Hypokalemia: Secondary | ICD-10-CM

## 2019-07-07 DIAGNOSIS — B9689 Other specified bacterial agents as the cause of diseases classified elsewhere: Secondary | ICD-10-CM

## 2019-07-07 DIAGNOSIS — C182 Malignant neoplasm of ascending colon: Secondary | ICD-10-CM

## 2019-07-07 DIAGNOSIS — D649 Anemia, unspecified: Secondary | ICD-10-CM

## 2019-07-07 DIAGNOSIS — A4181 Sepsis due to Enterococcus: Principal | ICD-10-CM

## 2019-07-07 DIAGNOSIS — R7881 Bacteremia: Secondary | ICD-10-CM

## 2019-07-07 DIAGNOSIS — Z87891 Personal history of nicotine dependence: Secondary | ICD-10-CM

## 2019-07-07 DIAGNOSIS — B952 Enterococcus as the cause of diseases classified elsewhere: Secondary | ICD-10-CM

## 2019-07-07 DIAGNOSIS — C189 Malignant neoplasm of colon, unspecified: Secondary | ICD-10-CM

## 2019-07-07 DIAGNOSIS — E43 Unspecified severe protein-calorie malnutrition: Secondary | ICD-10-CM | POA: Insufficient documentation

## 2019-07-07 DIAGNOSIS — C7951 Secondary malignant neoplasm of bone: Secondary | ICD-10-CM

## 2019-07-07 DIAGNOSIS — K750 Abscess of liver: Secondary | ICD-10-CM

## 2019-07-07 DIAGNOSIS — C772 Secondary and unspecified malignant neoplasm of intra-abdominal lymph nodes: Secondary | ICD-10-CM

## 2019-07-07 DIAGNOSIS — B961 Klebsiella pneumoniae [K. pneumoniae] as the cause of diseases classified elsewhere: Secondary | ICD-10-CM

## 2019-07-07 DIAGNOSIS — D696 Thrombocytopenia, unspecified: Secondary | ICD-10-CM

## 2019-07-07 DIAGNOSIS — E8809 Other disorders of plasma-protein metabolism, not elsewhere classified: Secondary | ICD-10-CM

## 2019-07-07 LAB — LIPID PANEL
Cholesterol: 85 mg/dL (ref 0–200)
HDL: <10 mg/dL — ABNORMAL LOW (ref >40)
Triglycerides: 135 mg/dL (ref <150)
VLDL: 27 mg/dL (ref 0–40)

## 2019-07-07 LAB — POTASSIUM: Potassium: 3.1 mmol/L — ABNORMAL LOW (ref 3.5–5.1)

## 2019-07-07 LAB — COMPREHENSIVE METABOLIC PANEL
ALT: 33 U/L (ref 0–44)
AST: 36 U/L (ref 15–41)
Albumin: 1.6 g/dL — ABNORMAL LOW (ref 3.5–5.0)
Alkaline Phosphatase: 156 U/L — ABNORMAL HIGH (ref 38–126)
Anion gap: 8 (ref 5–15)
BUN: 27 mg/dL — ABNORMAL HIGH (ref 6–20)
CO2: 28 mmol/L (ref 22–32)
Calcium: 7.4 mg/dL — ABNORMAL LOW (ref 8.9–10.3)
Chloride: 116 mmol/L — ABNORMAL HIGH (ref 98–111)
Creatinine, Ser: 0.88 mg/dL (ref 0.44–1.00)
GFR calc Af Amer: >60 mL/min (ref >60)
GFR calc non Af Amer: >60 mL/min (ref >60)
Glucose, Bld: 110 mg/dL — ABNORMAL HIGH (ref 70–99)
Potassium: 2.4 mmol/L — CL (ref 3.5–5.1)
Sodium: 152 mmol/L — ABNORMAL HIGH (ref 135–145)
Total Bilirubin: 1.2 mg/dL (ref 0.3–1.2)
Total Protein: 4.6 g/dL — ABNORMAL LOW (ref 6.5–8.1)

## 2019-07-07 LAB — GLUCOSE, CAPILLARY: Glucose-Capillary: 108 mg/dL — ABNORMAL HIGH (ref 70–99)

## 2019-07-07 LAB — HEMOGLOBIN A1C
Hgb A1c MFr Bld: 5.9 % — ABNORMAL HIGH (ref 4.8–5.6)
Mean Plasma Glucose: 122.63 mg/dL

## 2019-07-07 LAB — PHOSPHORUS: Phosphorus: 2.6 mg/dL (ref 2.5–4.6)

## 2019-07-07 LAB — MAGNESIUM: Magnesium: 2.2 mg/dL (ref 1.7–2.4)

## 2019-07-07 MED ORDER — ENOXAPARIN SODIUM 40 MG/0.4ML ~~LOC~~ SOLN
40.0000 mg | SUBCUTANEOUS | Status: DC
  Administered 2019-07-07: 22:00:00 40 mg via SUBCUTANEOUS
  Filled 2019-07-07: qty 0.4

## 2019-07-07 MED ORDER — POTASSIUM CL IN DEXTROSE 5% 20 MEQ/L IV SOLN
20.0000 meq | INTRAVENOUS | Status: DC
  Administered 2019-07-07 – 2019-07-08 (×3): 20 meq via INTRAVENOUS
  Filled 2019-07-07 (×5): qty 1000

## 2019-07-07 MED ORDER — POTASSIUM CHLORIDE 10 MEQ/100ML IV SOLN
10.0000 meq | INTRAVENOUS | Status: AC
  Administered 2019-07-07 (×4): 10 meq via INTRAVENOUS
  Filled 2019-07-07 (×4): qty 100

## 2019-07-07 MED ORDER — LIDOCAINE VISCOUS HCL 2 % MT SOLN
10.0000 mL | Freq: Four times a day (QID) | OROMUCOSAL | Status: DC
  Administered 2019-07-07 – 2019-07-10 (×6): 10 mL via OROMUCOSAL
  Filled 2019-07-07 (×6): qty 15

## 2019-07-07 MED ORDER — BOOST / RESOURCE BREEZE PO LIQD CUSTOM
1.0000 | Freq: Three times a day (TID) | ORAL | Status: DC
  Administered 2019-07-08 – 2019-07-09 (×2): 1 via ORAL

## 2019-07-07 MED ORDER — AMLODIPINE BESYLATE 10 MG PO TABS
10.0000 mg | ORAL_TABLET | Freq: Every day | ORAL | Status: DC
  Administered 2019-07-07 – 2019-07-10 (×4): 10 mg via ORAL
  Filled 2019-07-07 (×5): qty 1

## 2019-07-07 MED ORDER — CLONIDINE HCL 0.1 MG/24HR TD PTWK
0.1000 mg | MEDICATED_PATCH | TRANSDERMAL | Status: DC
  Administered 2019-07-07: 0.1 mg via TRANSDERMAL
  Filled 2019-07-07: qty 1

## 2019-07-07 MED ORDER — MAGIC MOUTHWASH W/LIDOCAINE: 10.0000 mL | Freq: Four times a day (QID) | ORAL | Status: DC

## 2019-07-07 MED ORDER — MAGIC MOUTHWASH
10.0000 mL | Freq: Four times a day (QID) | ORAL | Status: DC
  Administered 2019-07-07 – 2019-07-10 (×5): 10 mL via ORAL
  Filled 2019-07-07 (×23): qty 10

## 2019-07-07 MED ORDER — POTASSIUM CHLORIDE 10 MEQ/50ML IV SOLN
10.0000 meq | INTRAVENOUS | Status: AC
  Administered 2019-07-07 (×4): 10 meq via INTRAVENOUS
  Filled 2019-07-07 (×4): qty 50

## 2019-07-07 MED ORDER — ACETAMINOPHEN 325 MG PO TABS: 650.0000 mg | ORAL_TABLET | Freq: Four times a day (QID) | ORAL | Status: DC | PRN

## 2019-07-07 MED ORDER — VANCOMYCIN HCL 750 MG/150ML IV SOLN: 750.0000 mg | INTRAVENOUS | Status: DC

## 2019-07-07 MED ORDER — VANCOMYCIN HCL 750 MG/150ML IV SOLN
750.0000 mg | Freq: Once | INTRAVENOUS | Status: AC
  Administered 2019-07-07: 750 mg via INTRAVENOUS
  Filled 2019-07-07: qty 150

## 2019-07-07 MED ORDER — MENTHOL 3 MG MT LOZG
1.0000 | LOZENGE | OROMUCOSAL | Status: DC | PRN
  Filled 2019-07-07: qty 9

## 2019-07-07 NOTE — Progress Notes (Addendum)
[facility] for Electrolyte Monitoring and Replacement   Recent Labs: Potassium (mmol/L)  Date Value  07/07/2019 2.4 (LL)   Magnesium (mg/dL)  Date Value  07/07/2019 2.2   Calcium (mg/dL)  Date Value  07/07/2019 7.4 (L)   Albumin (g/dL)  Date Value  07/07/2019 1.6 (L)   Sodium (mmol/L)  Date Value  07/07/2019 152 (H)     Assessment: 59 year old female admitted with weakness and abdominal pain. Patient with positive blood cultures and hepatic abscess s/p drain placement. Patient with electrolyte abnormalities on presentation. Patient at refeeding risk. Pharmacy consulted to manage.  Goal of Therapy:  Electrolytes WNL  Plan:  K 2.4. Provider ordered D5 w/ 20 K at 100 ml/hr. Patient has received an additional 10 mEq IV x 4. Likely will need additional replacement. Will follow up potassium at 1700 and replace as needed.  Will check phos with labs this afternoon as patient at refeeding risk.   Patient with low glucose and slightly hypernatremic. Remains NPO. Now on D5 per MD. Will continue to monitor Na and glucose closely for necessary adjustments.  Tawnya Crook ,PharmD Clinical Pharmacist 07/07/2019 2:23 PM

## 2019-07-07 NOTE — Consult Note (Signed)
[place]  Telephone:([phone] [phone]   Name: Nancy Clark Date: 07/07/2019 MRN: [id number]  DOB: June 11, 1960  Patient Care Team: Patient, No Pcp Per as PCP - General (General Practice)    REASON FOR CONSULTATION: Nancy Clark is a 59 y.o. female with multiple medical problems including stage IV colorectal cancer status post right hemicolectomy and XRT. She is also status post liver ablation to area of hepatic metastasis. Patient is status post 5-FU plus bevacizumaband is now on treatment with nivolumab. Recent CT scan on 05/02/2019 revealed new biliary obstruction at the distal CBD due to compressive malignancy with biliary obstruction of the left and right hepatic lobes with 3 new hepatic lesions.  Patient underwent biliary stenting on 05/05/2019.  Patient has had rising CEA levels.  Opdivo has been held due to fevers.    Patient was admitted to the hospital on 07/06/2019 with fever and abdominal pain.  CT of abdomen/pelvis revealed an 8.5 cm abscess in the posterior right hepatic lobe.  Patient was also found to have progressive hepatic lesions with abnormal soft tissue in the porta hepatis concerning for tumor.  Patient is status post CT-guided drain of her hepatic abscess.  She was referred to palliative care to help discuss goals and manage ongoing symptoms..   SOCIAL HISTORY:     reports that she has quit smoking. She has a 0.50 pack-year smoking history. She has never used smokeless tobacco. She reports that she does not drink alcohol or use drugs..  Patient is married and lives at home with her husband and son. She has another son who lives in [place]. Patient previously worked at [employer] in environmental services.  ADVANCE DIRECTIVES:  Does not have  CODE STATUS: Full code  PAST MEDICAL HISTORY: Past Medical History:  Diagnosis Date  . Chicken pox   . Colon cancer ([place])    Partial colectomy 07/2015 + chemo tx's  . Hypertension   . Hypokalemia   . Menopause    > 5 yrs  . Peripheral neuropathy due to chemotherapy ([place])    bi lat feet    PAST SURGICAL HISTORY:  Past Surgical History:  Procedure Laterality Date  . BILIARY STENT PLACEMENT  05/05/2019   Procedure: BILIARY STENT PLACEMENT;  Surgeon: Ronnette Juniper, MD;  Location: [facility];  Service: Gastroenterology;;  . BREAST BIOPSY Right    2007? pt unsure done by [facility]  . COLONOSCOPY    . COLONOSCOPY WITH PROPOFOL N/A 10/14/2015   Procedure: COLONOSCOPY WITH PROPOFOL;  Surgeon: Hulen Luster, MD;  Location: [facility];  Service: Endoscopy;  Laterality: N/A;  . COLOSTOMY REVISION Right 08/09/2015   Procedure: COLON RESECTION RIGHT;  Surgeon: Florene Glen, MD;  Location: [facility] ORS;  Service: General;  Laterality: Right;  . ECTOPIC PREGNANCY SURGERY    . ERCP N/A 05/05/2019   Procedure: ENDOSCOPIC RETROGRADE CHOLANGIOPANCREATOGRAPHY (ERCP);  Surgeon: Ronnette Juniper, MD;  Location: [place];  Service: Gastroenterology;  Laterality: N/A;  . IR BONE TUMOR(S)RF ABLATION  05/24/2018  . IR KYPHO LUMBAR INC FX REDUCE BONE BX UNI/BIL CANNULATION INC/IMAGING  05/24/2018  . IR RADIOLOGIST EVAL & MGMT  05/08/2018  . IR RADIOLOGIST EVAL & MGMT  06/18/2018  . IR RADIOLOGIST EVAL & MGMT  07/31/2018  . IR RADIOLOGIST EVAL & MGMT  12/19/2018  . IR RADIOLOGIST EVAL & MGMT  02/27/2019  . PANCREATIC STENT PLACEMENT  05/05/2019   Procedure: PANCREATIC STENT PLACEMENT;  Surgeon: Ronnette Juniper, MD;  Location: [facility] ENDOSCOPY;  Service: Gastroenterology;;  . Sol Passer PLACEMENT N/A 09/01/2015   Procedure: INSERTION PORT-A-CATH;  Surgeon: Florene Glen, MD;  Location: [facility] ORS;  Service: General;  Laterality: N/A;  . RADIOLOGY WITH ANESTHESIA N/A 11/06/2018   Procedure: CT-MICROWAVE THERMAL ABLATION/LIVER;  Surgeon: Corrie Mckusick, DO;  Location: [facility];  Service: Anesthesiology;  Laterality: N/A;  . REMOVAL OF STONES  05/05/2019    Procedure: REMOVAL OF STONES;  Surgeon: Ronnette Juniper, MD;  Location: [place];  Service: Gastroenterology;;  . SHOULDER SURGERY Left   . SPHINCTEROTOMY  05/05/2019   Procedure: SPHINCTEROTOMY;  Surgeon: Ronnette Juniper, MD;  Location: [facility] ENDOSCOPY;  Service: Gastroenterology;;    HEMATOLOGY/ONCOLOGY HISTORY:  Oncology History Overview Note  # FEB-MARCH 2017- COLON CANCER- STAGE IV [s/p right hemi-colectomy; pT4apN2 (7/19LN)]; Pre-op CEA- [id number]  PET-  mesenteric adenopathy/mediastinal adenopathy/right-sided subclavicular lymph node.    April 17th -START FOLFOX; May 1st Add Avastin;   Aug 16th- Disc OX [sec PN]; con 5FU-Avastin; NOV 27th CT C/A/P- CR; except for 73m LUL; 5FU-Avastin q 3 W [Poor tolerance to FOLFOX].  # June/July 2019- Progression of Liver/Abd LN; July 2019- START FOLFIRI + Avastin; STOP iri 02/25/2018- sec to diarrhea; cont Avastin + 5FU  # March 2018- L2 uptake MET s/p RT [[facility]]; mid April X-geva  # L1 ablation/ostecool-  Jan 3rd 2020-   # march 2nd 2020- decrease 5FU CIV  to 20079mm2/  #June 17th, 2020-liver ablation; 3.5 cm lesion.  Intolerance to 5-FU plus Avastin.  Discontinued; Side effects/weight loss/ quality of life  #July 13th -Lonsurf cycle #1; x2 cycles; September 2020-progressive disease in the mesenteric region; liver disease stable;  #March 05, 2019-5-FU plus Avastin [peripheral neuropathy/diarrhea]; DEC 2020- Progression,  # JAN 2021- OPDIVO + REGO  # July 2nd CT scan-right laryngeal nerve palsy/hoarseness of voice [Dr. McQueen] CT-chest NEG; MRI brain negative.  # MOLECULAR TESTING: K-ras-EXON 2- MUTATED; MSI- STABLE.   # palliative care- [name].   DIAGNOSIS: colon ca  STAGE:  IV ;GOALS: pallaitive  CURRENT/MOST RECENT THERAPY: Opdivo [C]+ Regorefenib.     Cancer of ascending colon metastatic to intra-abdominal lymph node [place] 05/28/2019 -  Chemotherapy   The patient had nivolumab (OPDIVO) 240 mg in sodium chloride 0.9 %  100 mL chemo infusion, 240 mg, Intravenous, Once, 2 of 6 cycles Administration: 240 mg (05/28/2019), 240 mg (06/11/2019)  for chemotherapy treatment.      ALLERGIES:  has No Known Allergies.  MEDICATIONS:  Current Facility-Administered Medications  Medication Dose Route Frequency Provider Last Rate Last Admin  . calcium-vitamin D (OSCAL WITH D) 500-200 MG-UNIT per tablet 1 tablet  1 tablet Oral q1800 SiCharlett NoseRPH      . Chlorhexidine Gluconate Cloth 2 % PADS 6 each  6 each Topical Daily NiIvor CostaMD   6 each at 07/07/19 1011  . dextrose 5 % with KCl 20 mEq / L  infusion  20 mEq Intravenous Continuous PaFritzi MandesMD 100 mL/hr at 07/07/19 1012 20 mEq at 07/07/19 1012  . diphenoxylate-atropine (LOMOTIL) 2.5-0.025 MG per tablet 1 tablet  1 tablet Oral QID PRN NiIvor CostaMD      . DULoxetine (CYMBALTA) DR capsule 40 mg  40 mg Oral Daily NiIvor CostaMD   40 mg at 07/07/19 0943  . gabapentin (NEURONTIN) capsule 300 mg  300 mg Oral Daily SiCharlett NoseRPH   300 mg at 07/07/19 0943  . gabapentin (NEURONTIN) capsule  600 mg  600 mg Oral QHS Charlett Nose, RPH      . ibuprofen (ADVIL) tablet 200 mg  200 mg Oral Q6H PRN Ivor Costa, MD      . loperamide (IMODIUM) capsule 2 mg  2 mg Oral QID PRN Ivor Costa, MD      . menthol-cetylpyridinium (CEPACOL) lozenge 3 mg  1 lozenge Oral PRN Fritzi Mandes, MD      . morphine 2 MG/ML injection 1 mg  1 mg Intravenous Q2H PRN Darel Hong D, NP   1 mg at 07/06/19 2040  . multivitamin with minerals tablet 1 tablet  1 tablet Oral Daily Ivor Costa, MD   1 tablet at 07/07/19 0944  . ondansetron (ZOFRAN) injection 4 mg  4 mg Intravenous Q8H PRN Ivor Costa, MD   4 mg at 07/06/19 1557  . oxyCODONE (Oxy IR/ROXICODONE) immediate release tablet 5 mg  5 mg Oral Q6H PRN Ivor Costa, MD      . pantoprazole (PROTONIX) EC tablet 40 mg  40 mg Oral Q1200 Ivor Costa, MD   40 mg at 07/07/19 1158  . piperacillin-tazobactam (ZOSYN) IVPB 3.375 g  3.375 g Intravenous  Q8H Hallaji, Sheema M, RPH 12.5 mL/hr at 07/07/19 0539 3.375 g at 07/07/19 0539  . sodium chloride flush (NS) 0.9 % injection 5 mL  5 mL Intracatheter Q8H Aletta Edouard, MD   5 mL at 07/07/19 0540  . [START ON 07/08/2019] vancomycin (VANCOREADY) IVPB 750 mg/150 mL  750 mg Intravenous Q36H Fritzi Mandes, MD       Facility-Administered Medications Ordered in Other Encounters  Medication Dose Route Frequency Provider Last Rate Last Admin  . sodium chloride flush (NS) 0.9 % injection 10 mL  10 mL Intravenous PRN Cammie Sickle, MD   10 mL at 10/04/15 0901  . sodium chloride flush (NS) 0.9 % injection 10 mL  10 mL Intravenous PRN Charlaine Dalton R, MD   10 mL at 07/02/17 1020    VITAL SIGNS: BP (!) 152/79   Pulse (!) 52   Temp 98.3 F (36.8 C)   Resp 17   Ht 5' 4"  (1.626 m)   Wt 78 lb 4.2 oz (35.5 kg)   LMP  (LMP Unknown) Comment: LMP MORE THAN 5 YRS  SpO2 100%   BMI 13.43 kg/m  Filed Weights   07/05/19 2007 07/06/19 1522  Weight: 74 lb 8.3 oz (33.8 kg) 78 lb 4.2 oz (35.5 kg)    Estimated body mass index is 13.43 kg/m as calculated from the following:   Height as of this encounter: 5' 4"  (1.626 m).   Weight as of this encounter: 78 lb 4.2 oz (35.5 kg).  LABS: CBC:    Component Value Date/Time   WBC 6.2 07/05/2019 2034   HGB 10.5 (L) 07/05/2019 2034   HCT 34.4 (L) 07/05/2019 2034   PLT 81 (L) 07/05/2019 2034   MCV 81.7 07/05/2019 2034   NEUTROABS 5.7 07/05/2019 2034   LYMPHSABS 0.2 (L) 07/05/2019 2034   MONOABS 0.1 07/05/2019 2034   EOSABS 0.1 07/05/2019 2034   BASOSABS 0.1 07/05/2019 2034   Comprehensive Metabolic Panel:    Component Value Date/Time   NA 152 (H) 07/07/2019 0356   K 2.4 (LL) 07/07/2019 0356   CL 116 (H) 07/07/2019 0356   CO2 28 07/07/2019 0356   BUN 27 (H) 07/07/2019 0356   CREATININE 0.88 07/07/2019 0356   GLUCOSE 110 (H) 07/07/2019 0356   CALCIUM 7.4 (L) 07/07/2019 0356  AST 36 07/07/2019 0356   ALT 33 07/07/2019 0356   ALKPHOS 156  (H) 07/07/2019 0356   BILITOT 1.2 07/07/2019 0356   PROT 4.6 (L) 07/07/2019 0356   ALBUMIN 1.6 (L) 07/07/2019 0356    RADIOGRAPHIC STUDIES: CT ABDOMEN PELVIS W CONTRAST  Result Date: 07/05/2019 CLINICAL DATA:  Abdominal pain, fever, decreased appetite/PO intake. History of colon cancer. EXAM: CT ABDOMEN AND PELVIS WITH CONTRAST TECHNIQUE: Multidetector CT imaging of the abdomen and pelvis was performed using the standard protocol following bolus administration of intravenous contrast. CONTRAST:  50m OMNIPAQUE IOHEXOL 300 MG/ML  SOLN COMPARISON:  CT abdomen/pelvis dated 05/02/2019 and 02/24/2019. FINDINGS: Lower chest: Lung bases are clear. Hepatobiliary: 6.9 x 6.5 x 8.5 cm abscess with intraluminal gas in the posterior right hepatic lobe (series 2/image 9). This likely transgresses right anterior liver capsule (series 2/image 21; sagittal image 15). This region of the liver includes the prior ablation zone. Given intraluminal gas and the presence of an indwelling common duct stent, communication between the abscess in the biliary tree is suspected. At least one discrete 2.1 cm lesion is present in segment 4 B (series 2/image 23), previously 1.3 cm, favoring a metastasis. An additional 2.6 x 1.0 cm lesion anterior to the abscess is suspected in segment 5 (series 2/image 15), progressive from the prior, but difficult to definitively distinguish from the abscess. Mild pneumobilia in the left hepatic lobe. Mild periportal edema. Intraluminal soft tissue density within the mid/distal common duct stent (coronal image 22), nonspecific. Pancreas: Dilated pancreatic duct, measuring up to 7 mm in the proximal pancreatic tail (series 2/image 25), new. No definite pancreatic atrophy. Spleen: Within normal limits. Adrenals/Urinary Tract: Adrenal glands are within normal limits. Kidneys are within normal limits.  No hydronephrosis. Thick-walled bladder, although underdistended. Stomach/Bowel: Stomach is within normal  limits. No evidence of bowel obstruction. Status post right hemicolectomy with suture line in the right mid abdomen (series 2/image 38). Colon is decompressed. Vascular/Lymphatic: No evidence of abdominal aortic aneurysm. Atherosclerotic calcifications of the abdominal aorta and branch vessels. Abnormal soft tissue in the porta hepatis along the celiac axis (series 2/image 20), new/progressive from the prior, raising concern for tumor. This appearance is less typical of colon cancer and raises concern for hepatobiliary tumor such as pancreatic cancer or cholangiocarcinoma. Otherwise, no suspicious abdominopelvic lymphadenopathy. Reproductive: Uterus is within normal limits. Bilateral ovaries are unremarkable. Other: Small volume abdominopelvic ascites, including perihepatic fluid which directly communicates with the abscess, as described above. Musculoskeletal: Sclerosis involving the L2 vertebral body with prior vertebral augmentation. IMPRESSION: 8.5 cm abscess in the posterior right hepatic lobe, as described above. Abscess directly transgresses the anterior right hepatic capsule with associated perihepatic fluid and small volume abdominopelvic ascites. Communication between the abscess and biliary tree is suspected. Two suspected hepatic lesions which are progressive from the prior and favor metastases in this patient status post right hemicolectomy for colon cancer. Abnormal soft tissue in the porta hepatis along the celiac axis, new/progressive from the prior, raising concern for tumor. This appearance is less typical of colon cancer and raises concern for hepatobiliary tumor such as pancreatic cancer or cholangiocarcinoma. Associated new dilatation of the pancreatic duct. Associated intraluminal soft tissue in the mid/distal common duct stent, nonspecific. Electronically Signed   By: SJulian HyM.D.   On: 07/05/2019 22:14   DG Chest Port 1 View  Result Date: 07/05/2019 CLINICAL DATA:  Sepsis, 2  days of weakness and decreased appetite with poor p.o. intake EXAM: PORTABLE CHEST 1  VIEW COMPARISON:  CT chest 11/21/2018 FINDINGS: There is a small patchy opacity in the right lung base which could reflect early infection versus confluence vessels and airways. No other airspace opacity is seen. No pneumothorax or effusion. Accessed left subclavian approach Port-A-Cath tip terminates in the lower SVC. The aorta is calcified. The remaining cardiomediastinal contours are unremarkable. No acute osseous or soft tissue abnormality. Postsurgical changes in the left shoulder are noted. Upper abdominal biliary stent is noted. IMPRESSION: Small patchy opacity in the right lung base which could reflect early infection versus confluence vessels and airways. Electronically Signed   By: Lovena Le M.D.   On: 07/05/2019 21:07   CT IMAGE GUIDED DRAINAGE BY PERCUTANEOUS CATHETER  Result Date: 07/06/2019 CLINICAL DATA:  Hepatic abscess and history of metastatic colon carcinoma. EXAM: CT GUIDED CATHETER DRAINAGE OF HEPATIC ABSCESS ANESTHESIA/SEDATION: 1.0 mg IV Versed 50 mcg IV Fentanyl Total Moderate Sedation Time:  19 minutes The patient's level of consciousness and physiologic status were continuously monitored during the procedure by Radiology nursing. PROCEDURE: The procedure, risks, benefits, and alternatives were explained to the patient. Questions regarding the procedure were encouraged and answered. The patient understands and consents to the procedure. A time out was performed prior to initiating the procedure. The right abdominal wall was prepped with chlorhexidine in a sterile fashion, and a sterile drape was applied covering the operative field. A sterile gown and sterile gloves were used for the procedure. Local anesthesia was provided with 1% Lidocaine. CT was performed through the upper abdomen in a supine position. Under CT guidance, an 18 gauge trocar needle was advanced into a right lobe hepatic abscess.  Aspiration of fluid was performed. A guidewire was advanced into the collection and the needle removed. The tract was dilated and a 10 French percutaneous drainage catheter placed. Drainage catheter position was confirmed by CT. The drain was aspirated for additional fluid sample. The drainage catheter was then flushed with saline and connected to a suction bulb. The catheter was secured at the skin with a Prolene retention suture and StatLock device. COMPLICATIONS: None FINDINGS: The large right lobe hepatic abscess cavity yielded grossly purulent fluid. After placement of the 10 French drain, there is good return of brownish colored purulent fluid. IMPRESSION: CT-guided percutaneous catheter drainage of right lobe hepatic abscess yielding purulent fluid. A fluid sample was sent for culture analysis. A 10 French drainage catheter was placed and attached to suction bulb drainage. Electronically Signed   By: Aletta Edouard M.D.   On: 07/06/2019 15:14    PERFORMANCE STATUS (ECOG) : 4 - Bedbound  Review of Systems Unless otherwise noted, a complete review of systems is negative.  Physical Exam General: NAD, frail appearing, thin Cardiovascular: regular rate and rhythm Pulmonary: Unlabored Abdomen: Drain noted but not visualized Extremities: no edema, no joint deformities Skin: no rashes Neurological: Weakness, some confusion  IMPRESSION: Patient was seen in the ICU.  She is alert and comfortable appearing.  However, patient does have some slight confusion.  She knew she was in the hospital but thought the year was 2014 and the month was 2017.  She was not clear on why she was in the hospital.  Patient's confusion limited her ability to engage with me meaningfully in conversation regarding goals.  Patient is well-known to me from the outpatient clinic.  Patient has previously desired aggressive care and full scope of treatment.  She was previously seeking a second opinion at an outside cancer  center.  We had  previously discussed advance care planning and CODE STATUS and patient remained undecided regarding decision making.  I called and spoke with patient's mother.  I updated her on patient's current medical problems.  We discussed her overall poor prognosis in the setting of acute infection with progressive cancer.  We discussed the probable need for decision-making.  Patient's mother was interested in arranging a family meeting tomorrow with her and patient's husband.  Meeting will be attended by Dr. Rogue Bussing and me.  PLAN: -Continue current scope of treatment -Family meeting tomorrow with Dr. Rogue Bussing and me   Time Total: 60 minutes  Visit consisted of counseling and education dealing with the complex and emotionally intense issues of symptom management and palliative care in the setting of serious and potentially life-threatening illness.Greater than 50%  of this time was spent counseling and coordinating care related to the above assessment and plan.  Signed by: Altha Harm, PhD, NP-C

## 2019-07-07 NOTE — Progress Notes (Signed)
[place] visited pt. while rounding on ICU; pt. sitting in chair covered in blankets; pt.'s mother [name]) w/pt. in rm. Mother appeared slightly stressed and requested prayer for pt. and for herself as primary visitor.  [facility] made pt./family aware of chaplains' availability after offering prayer. No further needs expressed.       07/07/19 1625  Clinical Encounter Type  Visited With Patient and family together  Visit Type Initial;Psychological support;Spiritual support;Social support;Critical Care  Spiritual Encounters  Spiritual Needs Prayer;Emotional  Stress Factors  Patient Stress Factors Health changes  Family Stress Factors Health changes;Major life changes

## 2019-07-07 NOTE — Consult Note (Signed)
Pharmacy Antibiotic Note  Nancy Clark is a 59 y.o. female admitted on 07/05/2019 with weakness, diarrhea and abdominal pain. She has PMH of colon cancer. Patient found to have liver abscess and positive blood cultures. Patient s/p drain placement 2/14.  BCx: 4 of 4 positive. 4 of 4 GPC and 4 of 4 GNR.  BCID: Klebsiella oxytoca, Enterobacter cloacae complex, and Enterococcus species. Culture now resulted as E.faecalis.     Pharmacy has been consulted for Zosyn and vancomycin dosing.  Plan: Continue Zosyn 3.375 IV IE every 8 hours  Vancomycin 750 mg IV x 1 followed by vancomycin 750 mg IV Q 36 hrs  Goal AUC 400-550 Expected AUC: 520 SCr used: 0.88  Expect to likely discontinue vancomycin. ID following patient. Will follow up recommendations. Continue to follow renal function daily for any antibiotic adjustments.    Height: 5\' 4"  (162.6 cm) Weight: 78 lb 4.2 oz (35.5 kg) IBW/kg (Calculated) : 54.7  Temp (24hrs), Avg:98.1 F (36.7 C), Min:97.9 F (36.6 C), Max:98.3 F (36.8 C)  Recent Labs  Lab 07/02/19 0851 07/05/19 2034 07/05/19 2248 07/06/19 0220 07/06/19 0620 07/06/19 1702 07/07/19 0356  WBC 20.5* 6.2  --   --   --   --   --   CREATININE 0.76 1.06*  --   --   --   --  0.88  LATICACIDVEN  --  8.9* 8.8* 2.7* 1.8 1.1  --     Estimated Creatinine Clearance: 40 mL/min (by C-G formula based on SCr of 0.88 mg/dL).    No Known Allergies  Antimicrobials this admission: 2/14 vancomycin >> x 1 2/13 cefepime/Flagyl>> 2/14 2/14 Zosyn>>  2/15 vanc>>  Microbiology results: 2/13 BCx: Klebsiella oxytoca, Enterobacter cloacae complex, and E.faecalis 2/14 Abscess: GNR, GPC (chains)  Thank you for allowing pharmacy to be a part of this patient's care.  Tawnya Crook, PharmD Clinical Pharmacist 07/07/2019 2:13 PM

## 2019-07-07 NOTE — Consult Note (Signed)
[place] CONSULT NOTE  Patient Care Team: Patient, No Pcp Per as PCP - General (General Practice)  CHIEF COMPLAINTS/PURPOSE OF CONSULTATION: Metastatic colon cancer  HISTORY OF PRESENTING ILLNESS:  Nancy Clark 59 y.o.  female patient metastatic colon cancer status post multiple lines of therapy-is currently admitted to hospital for worsening abdominal pain fevers.  On admission CT scan emergency room-approximately 8-9-enlarging liver mass concerning for abscess.  Patient status post IR guided drainage catheter.  blood cultures positive.  Patient is currently on IV antibiotics.  She is currently in stepdown.  Patient is drowsy.  Her mother is by her bedside.    Review of Systems  Unable to perform ROS: Critical illness     MEDICAL HISTORY:  Past Medical History:  Diagnosis Date  . Chicken pox   . Colon cancer ([place])    Partial colectomy 07/2015 + chemo tx's  . Hypertension   . Hypokalemia   . Menopause    > 5 yrs  . Peripheral neuropathy due to chemotherapy ([place])    bi lat feet    SURGICAL HISTORY: Past Surgical History:  Procedure Laterality Date  . BILIARY STENT PLACEMENT  05/05/2019   Procedure: BILIARY STENT PLACEMENT;  Surgeon: Ronnette Juniper, MD;  Location: [place];  Service: Gastroenterology;;  . BREAST BIOPSY Right    2007? pt unsure done by [facility]  . COLONOSCOPY    . COLONOSCOPY WITH PROPOFOL N/A 10/14/2015   Procedure: COLONOSCOPY WITH PROPOFOL;  Surgeon: Hulen Luster, MD;  Location: [facility] ENDOSCOPY;  Service: Endoscopy;  Laterality: N/A;  . COLOSTOMY REVISION Right 08/09/2015   Procedure: COLON RESECTION RIGHT;  Surgeon: Florene Glen, MD;  Location: [facility] ORS;  Service: General;  Laterality: Right;  . ECTOPIC PREGNANCY SURGERY    . ERCP N/A 05/05/2019   Procedure: ENDOSCOPIC RETROGRADE CHOLANGIOPANCREATOGRAPHY (ERCP);  Surgeon: Ronnette Juniper, MD;  Location: [place];  Service: Gastroenterology;  Laterality: N/A;  . IR BONE TUMOR(S)RF  ABLATION  05/24/2018  . IR KYPHO LUMBAR INC FX REDUCE BONE BX UNI/BIL CANNULATION INC/IMAGING  05/24/2018  . IR RADIOLOGIST EVAL & MGMT  05/08/2018  . IR RADIOLOGIST EVAL & MGMT  06/18/2018  . IR RADIOLOGIST EVAL & MGMT  07/31/2018  . IR RADIOLOGIST EVAL & MGMT  12/19/2018  . IR RADIOLOGIST EVAL & MGMT  02/27/2019  . PANCREATIC STENT PLACEMENT  05/05/2019   Procedure: PANCREATIC STENT PLACEMENT;  Surgeon: Ronnette Juniper, MD;  Location: [facility] ENDOSCOPY;  Service: Gastroenterology;;  . Sol Passer PLACEMENT N/A 09/01/2015   Procedure: INSERTION PORT-A-CATH;  Surgeon: Florene Glen, MD;  Location: [facility] ORS;  Service: General;  Laterality: N/A;  . RADIOLOGY WITH ANESTHESIA N/A 11/06/2018   Procedure: CT-MICROWAVE THERMAL ABLATION/LIVER;  Surgeon: Corrie Mckusick, DO;  Location: [facility] ORS;  Service: Anesthesiology;  Laterality: N/A;  . REMOVAL OF STONES  05/05/2019   Procedure: REMOVAL OF STONES;  Surgeon: Ronnette Juniper, MD;  Location: [place];  Service: Gastroenterology;;  . SHOULDER SURGERY Left   . SPHINCTEROTOMY  05/05/2019   Procedure: SPHINCTEROTOMY;  Surgeon: Ronnette Juniper, MD;  Location: [facility] ENDOSCOPY;  Service: Gastroenterology;;    SOCIAL HISTORY: Social History   Socioeconomic History  . Marital status: Married    Spouse name: Not on file  . Number of children: Not on file  . Years of education: Not on file  . Highest education level: Not on file  Occupational History  . Not on file  Tobacco Use  . Smoking status: Former Smoker  Packs/day: 0.25    Years: 2.00    Pack years: 0.50  . Smokeless tobacco: Never Used  . Tobacco comment: recently quit  Substance and Sexual Activity  . Alcohol use: No    Alcohol/week: 0.0 standard drinks  . Drug use: No  . Sexual activity: Yes  Other Topics Concern  . Not on file  Social History Narrative  . Not on file   Social Determinants of Health   Financial Resource Strain:   . Difficulty of Paying Living Expenses: Not on file  Food Insecurity:    . Worried About Charity fundraiser in the Last Year: Not on file  . Ran Out of Food in the Last Year: Not on file  Transportation Needs:   . Lack of Transportation (Medical): Not on file  . Lack of Transportation (Non-Medical): Not on file  Physical Activity:   . Days of Exercise per Week: Not on file  . Minutes of Exercise per Session: Not on file  Stress:   . Feeling of Stress : Not on file  Social Connections:   . Frequency of Communication with Friends and Family: Not on file  . Frequency of Social Gatherings with Friends and Family: Not on file  . Attends Religious Services: Not on file  . Active Member of Clubs or Organizations: Not on file  . Attends Archivist Meetings: Not on file  . Marital Status: Not on file  Intimate Partner Violence:   . Fear of Current or Ex-Partner: Not on file  . Emotionally Abused: Not on file  . Physically Abused: Not on file  . Sexually Abused: Not on file    FAMILY HISTORY: Family History  Problem Relation Age of Onset  . Hypertension Mother   . Arthritis Father   . Prostate cancer Father   . Diabetes Maternal Grandmother   . Colon cancer Maternal Grandfather        colon    ALLERGIES:  has No Known Allergies.  MEDICATIONS:  Current Facility-Administered Medications  Medication Dose Route Frequency Provider Last Rate Last Admin  . acetaminophen (TYLENOL) tablet 650 mg  650 mg Oral Q6H PRN Fritzi Mandes, MD      . amLODipine (NORVASC) tablet 10 mg  10 mg Oral Daily Fritzi Mandes, MD   10 mg at 07/07/19 1706  . calcium-vitamin D (OSCAL WITH D) 500-200 MG-UNIT per tablet 1 tablet  1 tablet Oral q1800 Charlett Nose, RPH   1 tablet at 07/07/19 1706  . Chlorhexidine Gluconate Cloth 2 % PADS 6 each  6 each Topical Daily Ivor Costa, MD   6 each at 07/07/19 1011  . cloNIDine (CATAPRES - Dosed in mg/24 hr) patch 0.1 mg  0.1 mg Transdermal Q Mon-1800 Fritzi Mandes, MD   0.1 mg at 07/07/19 1707  . dextrose 5 % with KCl 20 mEq / L   infusion  20 mEq Intravenous Continuous Fritzi Mandes, MD 100 mL/hr at 07/07/19 1012 20 mEq at 07/07/19 1012  . diphenoxylate-atropine (LOMOTIL) 2.5-0.025 MG per tablet 1 tablet  1 tablet Oral QID PRN Ivor Costa, MD      . DULoxetine (CYMBALTA) DR capsule 40 mg  40 mg Oral Daily Ivor Costa, MD   40 mg at 07/07/19 0943  . enoxaparin (LOVENOX) injection 40 mg  40 mg Subcutaneous Q24H Fritzi Mandes, MD      . Derrill Memo ON 07/08/2019] feeding supplement (BOOST / RESOURCE BREEZE) liquid 1 Container  1 Container Oral TID BM Posey Pronto,  Sona, MD      . gabapentin (NEURONTIN) capsule 300 mg  300 mg Oral Daily Charlett Nose, RPH   300 mg at 07/07/19 0943  . gabapentin (NEURONTIN) capsule 600 mg  600 mg Oral QHS Charlett Nose, RPH      . ibuprofen (ADVIL) tablet 200 mg  200 mg Oral Q6H PRN Ivor Costa, MD      . loperamide (IMODIUM) capsule 2 mg  2 mg Oral QID PRN Ivor Costa, MD      . magic mouthwash w/lidocaine  10 mL Oral QID Charlaine Dalton R, MD      . menthol-cetylpyridinium (CEPACOL) lozenge 3 mg  1 lozenge Oral PRN Fritzi Mandes, MD      . morphine 2 MG/ML injection 1 mg  1 mg Intravenous Q2H PRN Darel Hong D, NP   1 mg at 07/06/19 2040  . multivitamin with minerals tablet 1 tablet  1 tablet Oral Daily Ivor Costa, MD   1 tablet at 07/07/19 0944  . ondansetron (ZOFRAN) injection 4 mg  4 mg Intravenous Q8H PRN Ivor Costa, MD   4 mg at 07/06/19 1557  . oxyCODONE (Oxy IR/ROXICODONE) immediate release tablet 5 mg  5 mg Oral Q6H PRN Ivor Costa, MD      . pantoprazole (PROTONIX) EC tablet 40 mg  40 mg Oral Q1200 Ivor Costa, MD   40 mg at 07/07/19 1158  . piperacillin-tazobactam (ZOSYN) IVPB 3.375 g  3.375 g Intravenous Q8H Hallaji, Sheema M, RPH 12.5 mL/hr at 07/07/19 1525 3.375 g at 07/07/19 1525  . potassium chloride 10 mEq in 100 mL IVPB  10 mEq Intravenous Q1 Hr x 4 Hallaji, Sheema M, RPH      . sodium chloride flush (NS) 0.9 % injection 5 mL  5 mL Intracatheter Q8H Aletta Edouard, MD   5 mL at  07/07/19 1526   Facility-Administered Medications Ordered in Other Encounters  Medication Dose Route Frequency Provider Last Rate Last Admin  . sodium chloride flush (NS) 0.9 % injection 10 mL  10 mL Intravenous PRN Cammie Sickle, MD   10 mL at 10/04/15 0901  . sodium chloride flush (NS) 0.9 % injection 10 mL  10 mL Intravenous PRN Cammie Sickle, MD   10 mL at 07/02/17 1020      .  PHYSICAL EXAMINATION:  Vitals:   07/07/19 1706 07/07/19 1800  BP: (!) 162/66 (!) 148/70  Pulse:  (!) 48  Resp:  15  Temp:    SpO2:     Filed Weights   07/05/19 2007 07/06/19 1522  Weight: 74 lb 8.3 oz (33.8 kg) 78 lb 4.2 oz (35.5 kg)    Physical Exam  Constitutional:  Thin cachectic appearing female patient.  She is sitting in the chair.  HENT:  Head: Normocephalic and atraumatic.  Mouth/Throat: Oropharynx is clear and moist. No oropharyngeal exudate.  Mucosal ulcers noted.  Eyes: Pupils are equal, round, and reactive to light.  Cardiovascular: Normal rate and regular rhythm.  Pulmonary/Chest: No respiratory distress. She has no wheezes.  Decreased air entry bilaterally.  Abdominal: Soft. Bowel sounds are normal. She exhibits no distension and no mass. There is no abdominal tenderness. There is no rebound and no guarding.  Positive for hepatomegaly.  Musculoskeletal:        General: No tenderness or edema. Normal range of motion.     Cervical back: Normal range of motion and neck supple.  Neurological:  Drowsy; oriented x2.  Gait not  tested.   Skin: Skin is warm.  Psychiatric:  Flat affect.     LABORATORY DATA:  I have reviewed the data as listed Lab Results  Component Value Date   WBC 6.2 07/05/2019   HGB 10.5 (L) 07/05/2019   HCT 34.4 (L) 07/05/2019   MCV 81.7 07/05/2019   PLT 81 (L) 07/05/2019   Recent Labs    03/03/19 1126 03/05/19 0917 05/02/19 1358 05/02/19 1358 05/03/19 1017 05/04/19 0426 07/02/19 0851 07/02/19 0851 07/05/19 2034 07/07/19 0356  07/07/19 1804  NA 139   < > 140   < > 145   < > 141  --  148* 152*  --   K 3.6   < > 2.4*   < > 2.6*   < > 2.2*   < > 2.8* 2.4* 3.1*  CL 104   < > 102   < > 104   < > 99  --  101 116*  --   CO2 25   < > 28   < > 27   < > 28  --  25 28  --   GLUCOSE 103*   < > 94   < > 64*   < > 159*  --  118* 110*  --   BUN 9   < > 7   < > 11   < > 10  --  29* 27*  --   CREATININE 0.67   < > 0.59   < > 0.94   < > 0.76  --  1.06* 0.88  --   CALCIUM 9.2   < > 8.7*   < > 8.4*   < > 8.2*  --  8.6* 7.4*  --   GFRNONAA >60   < > >60   < > >60   < > >60  --  59* >60  --   GFRAA >60   < > >60   < > >60   < > >60  --  >60 >60  --   PROT 7.5   < > 6.4*   < > 5.3*   < > 6.7  --  7.4 4.6*  --   ALBUMIN 4.0   < > 3.1*   < > 2.5*   < > 2.3*  --  2.4* 1.6*  --   AST 17   < > 116*   < > 83*   < > 28  --  84* 36  --   ALT 11   < > 95*   < > 77*   < > 18  --  43 33  --   ALKPHOS 115   < > 532*   < > 455*   < > 200*  --  531* 156*  --   BILITOT 0.7   < > 7.4*   < > 8.5*   < > 1.2  --  1.4* 1.2  --   BILIDIR <0.1  --  5.0*  --  5.4*  --   --   --   --   --   --   IBILI NOT CALCULATED  --   --   --   --   --   --   --   --   --   --    < > = values in this interval not displayed.    RADIOGRAPHIC STUDIES: I have personally reviewed the radiological images as listed and agreed with the findings in the  report. CT ABDOMEN PELVIS W CONTRAST  Result Date: 07/05/2019 CLINICAL DATA:  Abdominal pain, fever, decreased appetite/PO intake. History of colon cancer. EXAM: CT ABDOMEN AND PELVIS WITH CONTRAST TECHNIQUE: Multidetector CT imaging of the abdomen and pelvis was performed using the standard protocol following bolus administration of intravenous contrast. CONTRAST:  63mL OMNIPAQUE IOHEXOL 300 MG/ML  SOLN COMPARISON:  CT abdomen/pelvis dated 05/02/2019 and 02/24/2019. FINDINGS: Lower chest: Lung bases are clear. Hepatobiliary: 6.9 x 6.5 x 8.5 cm abscess with intraluminal gas in the posterior right hepatic lobe (series 2/image 9).  This likely transgresses right anterior liver capsule (series 2/image 21; sagittal image 15). This region of the liver includes the prior ablation zone. Given intraluminal gas and the presence of an indwelling common duct stent, communication between the abscess in the biliary tree is suspected. At least one discrete 2.1 cm lesion is present in segment 4 B (series 2/image 23), previously 1.3 cm, favoring a metastasis. An additional 2.6 x 1.0 cm lesion anterior to the abscess is suspected in segment 5 (series 2/image 15), progressive from the prior, but difficult to definitively distinguish from the abscess. Mild pneumobilia in the left hepatic lobe. Mild periportal edema. Intraluminal soft tissue density within the mid/distal common duct stent (coronal image 22), nonspecific. Pancreas: Dilated pancreatic duct, measuring up to 7 mm in the proximal pancreatic tail (series 2/image 25), new. No definite pancreatic atrophy. Spleen: Within normal limits. Adrenals/Urinary Tract: Adrenal glands are within normal limits. Kidneys are within normal limits.  No hydronephrosis. Thick-walled bladder, although underdistended. Stomach/Bowel: Stomach is within normal limits. No evidence of bowel obstruction. Status post right hemicolectomy with suture line in the right mid abdomen (series 2/image 38). Colon is decompressed. Vascular/Lymphatic: No evidence of abdominal aortic aneurysm. Atherosclerotic calcifications of the abdominal aorta and branch vessels. Abnormal soft tissue in the porta hepatis along the celiac axis (series 2/image 20), new/progressive from the prior, raising concern for tumor. This appearance is less typical of colon cancer and raises concern for hepatobiliary tumor such as pancreatic cancer or cholangiocarcinoma. Otherwise, no suspicious abdominopelvic lymphadenopathy. Reproductive: Uterus is within normal limits. Bilateral ovaries are unremarkable. Other: Small volume abdominopelvic ascites, including  perihepatic fluid which directly communicates with the abscess, as described above. Musculoskeletal: Sclerosis involving the L2 vertebral body with prior vertebral augmentation. IMPRESSION: 8.5 cm abscess in the posterior right hepatic lobe, as described above. Abscess directly transgresses the anterior right hepatic capsule with associated perihepatic fluid and small volume abdominopelvic ascites. Communication between the abscess and biliary tree is suspected. Two suspected hepatic lesions which are progressive from the prior and favor metastases in this patient status post right hemicolectomy for colon cancer. Abnormal soft tissue in the porta hepatis along the celiac axis, new/progressive from the prior, raising concern for tumor. This appearance is less typical of colon cancer and raises concern for hepatobiliary tumor such as pancreatic cancer or cholangiocarcinoma. Associated new dilatation of the pancreatic duct. Associated intraluminal soft tissue in the mid/distal common duct stent, nonspecific. Electronically Signed   By: Julian Hy M.D.   On: 07/05/2019 22:14   DG Chest Port 1 View  Result Date: 07/05/2019 CLINICAL DATA:  Sepsis, 2 days of weakness and decreased appetite with poor p.o. intake EXAM: PORTABLE CHEST 1 VIEW COMPARISON:  CT chest 11/21/2018 FINDINGS: There is a small patchy opacity in the right lung base which could reflect early infection versus confluence vessels and airways. No other airspace opacity is seen. No pneumothorax or effusion. Accessed left subclavian approach  Port-A-Cath tip terminates in the lower SVC. The aorta is calcified. The remaining cardiomediastinal contours are unremarkable. No acute osseous or soft tissue abnormality. Postsurgical changes in the left shoulder are noted. Upper abdominal biliary stent is noted. IMPRESSION: Small patchy opacity in the right lung base which could reflect early infection versus confluence vessels and airways. Electronically  Signed   By: Lovena Le M.D.   On: 07/05/2019 21:07   CT IMAGE GUIDED DRAINAGE BY PERCUTANEOUS CATHETER  Result Date: 07/06/2019 CLINICAL DATA:  Hepatic abscess and history of metastatic colon carcinoma. EXAM: CT GUIDED CATHETER DRAINAGE OF HEPATIC ABSCESS ANESTHESIA/SEDATION: 1.0 mg IV Versed 50 mcg IV Fentanyl Total Moderate Sedation Time:  19 minutes The patient's level of consciousness and physiologic status were continuously monitored during the procedure by Radiology nursing. PROCEDURE: The procedure, risks, benefits, and alternatives were explained to the patient. Questions regarding the procedure were encouraged and answered. The patient understands and consents to the procedure. A time out was performed prior to initiating the procedure. The right abdominal wall was prepped with chlorhexidine in a sterile fashion, and a sterile drape was applied covering the operative field. A sterile gown and sterile gloves were used for the procedure. Local anesthesia was provided with 1% Lidocaine. CT was performed through the upper abdomen in a supine position. Under CT guidance, an 18 gauge trocar needle was advanced into a right lobe hepatic abscess. Aspiration of fluid was performed. A guidewire was advanced into the collection and the needle removed. The tract was dilated and a 10 French percutaneous drainage catheter placed. Drainage catheter position was confirmed by CT. The drain was aspirated for additional fluid sample. The drainage catheter was then flushed with saline and connected to a suction bulb. The catheter was secured at the skin with a Prolene retention suture and StatLock device. COMPLICATIONS: None FINDINGS: The large right lobe hepatic abscess cavity yielded grossly purulent fluid. After placement of the 10 French drain, there is good return of brownish colored purulent fluid. IMPRESSION: CT-guided percutaneous catheter drainage of right lobe hepatic abscess yielding purulent fluid. A fluid  sample was sent for culture analysis. A 10 French drainage catheter was placed and attached to suction bulb drainage. Electronically Signed   By: Aletta Edouard M.D.   On: 07/06/2019 15:14    Cancer of ascending colon metastatic to intra-abdominal lymph node [facility]) #59 year old female patient with metastatic colon cancer status post multiple lines of therapy-that admitted hospital for fever/worsening abdominal pain; CT scan suggestive of liver abscess/possible blood cultures.   # Colon cancer- cecal/ right-sided; STAGE IV-patient most recently on Opdivo plus Stivarga s/p cycle #1-; February, 15th CT scan and pelvis-89 cm liver abscess [see below]; however progressive lesions in the liver/also mesenteric mass-concerning for progressive disease.  See below  #Liver abscess s/p drainage-positive for bacteremia/blood cultures-patient will likely need long-term antibiotics  # Mucositis-grade 3-secondary to [place].  Continue to hold Stivarga.  # hypokalemia-2.2;-continue potassium supplementation  Prognosis: Patient has extreme poor prognosis and the fact that she has progressed through multiple lines of therapy; and the fact that she has poor tolerance to therapy.  Given the bacteremia/liver abscess-patient will need long-term antibiotics/time to recuperate-which would unfortunately that could prohibit starting any further therapy.  Given the progressive disease/poor tolerance of therapy-I am highly doubtful if patient would ever be a candidate for restarting her antitumor therapy.  I would recommend palliative care evaluation-also readdress goals of care in the context of her progressive disease/serious infectious complication.  Discussed with  [name]; will plan to have a family meeting tomorrow.  Thank you Dr.Patel for allowing me to participate in the care of your pleasant patient. Please do not hesitate to contact me with questions or concerns in the interim.  # I reviewed the blood work- with  the patient in detail; also reviewed the imaging independently [as summarized above]; and with the patient in detail.  All questions were answered.      Cammie Sickle, MD 07/07/2019 7:44 PM

## 2019-07-07 NOTE — Consult Note (Signed)
NAME: Nancy Clark  DOB: Apr 13, 1961  MRN: [id number]  Date/Time: 07/07/2019 12:19 PM  REQUESTING PROVIDER: Dr..Patel Subjective:  REASON FOR CONSULT: polymicrobial bacteremia Pt is a limited historian- chart reviewed? Nancy Clark is a 59 y.o. with a history of metastatic colon cancer presents to the ED on 07/05/19 with increased  weakness, decreased appetite an poor intake for the past few days. In the ED temp 102.3, 134/80,HR 110. Labs revealed a alkaline phosphatase of 531, K 2.8 lactate of 8.9, WBC of 6.2, PLT 81 Blood culture sent CT abdomen showed 6.9 x 6.5 x 8.5 cm abscess with intraluminal gas in the posterior right hepatic lobe Pt had IR place a liver drain Blood culture positive for enterococcus, enterobacter and kleb. I am seeing the patient for the same Pt was hospitalized in Dec 2020  for obstructive jaundice and  CT abdomen done on 05/02/19 showed new biliary obstruction at the level of the mid to distal common bile duct. She underwent ERCP on 05/05/19 and CBD was stented after some difficulty at [facility]. Her lfts improved significantly after that. She took her last dose of Check point inhibitors in Jan   She has a complicated oncology history. Pt 's oncologist is Dr.Brahmandy at [facility]  In February 2017 she presented with metastatic colon cancer.  At that time the patient had right hemicolectomy and 7 of the 19 lymph nodes was positive for metastatic disease.  A PET scan performed at that time showed mesenteric, mediastinal and right subclavicular adenopathy.  She was started on FOLFOX chemotherapy on 09/06/2015.  With cycle 2 of FOLFOX bevacizumab  was added to the chemotherapy.  In August 2017 oxaliplatin was discontinued secondary to peripheral neuropathy and 5-FU and bevacizumab was continued. On 04/17/2016 the patient underwent a follow-up CT scan of chest abdomen pelvis that demonstrated marked improvement of the previously identified adenopathy suggesting a near complete  resolution to therapy.  At that time was noted a 4 mm left upper lobe pulmonary nodule.  In June 2019 reimaging demonstrated progression of the disease in the liver and intra-abdominal lymphadenopathy.  In July 2019 the patient was restarted on chemotherapy with a combination of 5-fluorouracil, irinotecan and bevacizumab.  Irinotecan was discontinued in October 2019 due to intractable diarrhea and chemo was continued with a combination of 5-FU and bevacizumab.  In January 2020 a bony metastatic deposit at L1 was treated with ablation.  June 2020 the patient also underwent a hepatic ablation of a 3.5 cm lesion secondary to evolving intolerance to a combination of 5-FU and Avastin.  At that time a 5-FU and bevacizumab discontinued due to cumulative toxicities weight loss and poor quality of life. In mid July 2020 patient resumed chemotherapy with Lonsurf and received a total of 2 cycles.  Repeat staging in September 2020 demonstrated progressing disease in the previously identified mesenteric lymphadenopathy.  The liver mets were judged to be stable.  In December 2020 restaging again demonstrated evidence of progressive disease and in January 2021  patient was started on a combination of nivolumab and regorafenib.  Because of ongoing fevers, falling performance status and weight loss and mucositis the medications were held as of 07/02/2019.  Lab studies revealed marked hypokalemia as well as hypoalbuminemia.  CEA from 06/25/2019 was elevated at 35.4.  With a gradual upward trend.  Patient was referred to [facility] for second opinion with respect to ongoing therapy.   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 Marital  status: Married    Spouse name: Not on file  . Number of children: Not on file  . Years of education: Not on file  . Highest education level: Not on file  Occupational History  . Not on file  Tobacco Use  . Smoking status: Former Smoker    Packs/day: 0.25    Years: 2.00    Pack years: 0.50  . Smokeless tobacco: Never Used  . Tobacco comment: recently quit  Substance and Sexual Activity  . Alcohol use: No    Alcohol/week: 0.0 standard drinks  . Drug use: No  . Sexual activity: Yes  Other Topics Concern  . Not on file  Social History Narrative  . Not on file   Social Determinants of Health   Financial Resource Strain:   . Difficulty of Paying Living Expenses: Not on file  Food Insecurity:   . Worried About Charity fundraiser in the Last Year: Not on file  . Ran Out of Food in the Last Year: Not on file  Transportation Needs:   . Lack of Transportation (Medical): Not on file  . Lack of Transportation (Non-Medical): Not on file  Physical Activity:   . Days of Exercise per Week: Not on file  . Minutes of Exercise per Session: Not on file  Stress:   . Feeling of Stress : Not on file  Social Connections:   . Frequency of Communication with Friends and Family: Not on file  . Frequency of Social Gatherings with Friends and Family: Not on file  . Attends Religious Services: Not on file  . Active Member of Clubs or Organizations: Not on file  . Attends Archivist Meetings: Not on file  . Marital Status: Not on file  Intimate Partner Violence:   . Fear of Current or Ex-Partner: Not on file  . Emotionally Abused: Not on file  . Physically Abused: Not on file  . Sexually Abused: Not on file    Family History  Problem Relation Age of Onset  . Hypertension Mother   . Arthritis Father   . Prostate cancer Father   . Diabetes Maternal Grandmother   . Colon cancer Maternal Grandfather        colon   No Known Allergies  Current Facility-Administered Medications    Medication Dose Route Frequency Provider Last Rate Last Admin  . calcium-vitamin D (OSCAL WITH D) 500-200 MG-UNIT per tablet 1 tablet  1 tablet Oral q1800 Charlett Nose, RPH      . Chlorhexidine Gluconate Cloth 2 % PADS 6 each  6 each Topical Daily Ivor Costa, MD   6 each at 07/07/19 1011  . dextrose 5 % with KCl 20 mEq / L  infusion  20 mEq Intravenous Continuous Fritzi Mandes, MD 100 mL/hr at 07/07/19 1012 20 mEq at 07/07/19 1012  . diphenoxylate-atropine (LOMOTIL) 2.5-0.025 MG per tablet 1 tablet  1 tablet Oral QID PRN Ivor Costa, MD      . DULoxetine (CYMBALTA) DR capsule 40 mg  40 mg Oral Daily Ivor Costa, MD   40 mg at 07/07/19 0943  . gabapentin (NEURONTIN) capsule 300 mg  300 mg Oral Daily Charlett Nose, RPH   300 mg at 07/07/19 0943  . gabapentin (NEURONTIN)  capsule 600 mg  600 mg Oral QHS Charlett Nose, RPH      . ibuprofen (ADVIL) tablet 200 mg  200 mg Oral Q6H PRN Ivor Costa, MD      . loperamide (IMODIUM) capsule 2 mg  2 mg Oral QID PRN Ivor Costa, MD      . menthol-cetylpyridinium (CEPACOL) lozenge 3 mg  1 lozenge Oral PRN Fritzi Mandes, MD      . morphine 2 MG/ML injection 1 mg  1 mg Intravenous Q2H PRN Darel Hong D, NP   1 mg at 07/06/19 2040  . multivitamin with minerals tablet 1 tablet  1 tablet Oral Daily Ivor Costa, MD   1 tablet at 07/07/19 0944  . ondansetron (ZOFRAN) injection 4 mg  4 mg Intravenous Q8H PRN Ivor Costa, MD   4 mg at 07/06/19 1557  . oxyCODONE (Oxy IR/ROXICODONE) immediate release tablet 5 mg  5 mg Oral Q6H PRN Ivor Costa, MD      . pantoprazole (PROTONIX) EC tablet 40 mg  40 mg Oral Q1200 Ivor Costa, MD   40 mg at 07/07/19 1158  . piperacillin-tazobactam (ZOSYN) IVPB 3.375 g  3.375 g Intravenous Q8H Hallaji, Sheema M, RPH 12.5 mL/hr at 07/07/19 0539 3.375 g at 07/07/19 0539  . potassium chloride 10 mEq in 50 mL *CENTRAL LINE* IVPB  10 mEq Intravenous Q1 Hr x 4 Fritzi Mandes, MD 50 mL/hr at 07/07/19 1156 10 mEq at 07/07/19 1156  . sodium  chloride flush (NS) 0.9 % injection 5 mL  5 mL Intracatheter Q8H Aletta Edouard, MD   5 mL at 07/07/19 0540   Facility-Administered Medications Ordered in Other Encounters  Medication Dose Route Frequency Provider Last Rate Last Admin  . sodium chloride flush (NS) 0.9 % injection 10 mL  10 mL Intravenous PRN Cammie Sickle, MD   10 mL at 10/04/15 0901  . sodium chloride flush (NS) 0.9 % injection 10 mL  10 mL Intravenous PRN Charlaine Dalton R, MD   10 mL at 07/02/17 1020     Abtx:  Anti-infectives (From admission, onward)   Start     Dose/Rate Route Frequency Ordered Stop   07/07/19 1000  vancomycin (VANCOREADY) IVPB 750 mg/150 mL     750 mg 150 mL/hr over 60 Minutes Intravenous  Once 07/07/19 0914 07/07/19 1155   07/06/19 1130  ceFEPIme (MAXIPIME) 1 g in sodium chloride 0.9 % 100 mL IVPB  Status:  Discontinued     1 g 200 mL/hr over 30 Minutes Intravenous Every 12 hours 07/06/19 0029 07/06/19 0942   07/06/19 1000  piperacillin-tazobactam (ZOSYN) IVPB 3.375 g     3.375 g 12.5 mL/hr over 240 Minutes Intravenous Every 8 hours 07/06/19 0943     07/06/19 0530  metroNIDAZOLE (FLAGYL) IVPB 500 mg  Status:  Discontinued     500 mg 100 mL/hr over 60 Minutes Intravenous Every 6 hours 07/06/19 0029 07/06/19 0942   07/05/19 2145  vancomycin (VANCOREADY) IVPB 750 mg/150 mL     750 mg 150 mL/hr over 60 Minutes Intravenous  Once 07/05/19 2132 07/06/19 0216   07/05/19 2130  ceFEPIme (MAXIPIME) 2 g in sodium chloride 0.9 % 100 mL IVPB     2 g 200 mL/hr over 30 Minutes Intravenous  Once 07/05/19 2122 07/05/19 2338   07/05/19 2130  vancomycin (VANCOCIN) IVPB 1000 mg/200 mL premix  Status:  Discontinued     1,000 mg 200 mL/hr over 60 Minutes Intravenous  Once 07/05/19 2122 07/05/19  2131   07/05/19 2130  metroNIDAZOLE (FLAGYL) IVPB 500 mg     500 mg 100 mL/hr over 60 Minutes Intravenous  Once 07/05/19 2122 07/06/19 0120      REVIEW OF SYSTEMS:  Const:  fever, negative chills, weight  loss Eyes: negative diplopia or visual changes, negative eye pain ENT: negative coryza, negative sore throat Resp: negative cough, hemoptysis, dyspnea Cards: negative for chest pain, palpitations, lower extremity edema GU: negative for frequency, dysuria and hematuria GI: Negative for abdominal pain, has diarrhea, no bleeding, constipation, poor appetite Skin: negative for rash and pruritus Heme: negative for easy bruising and gum/nose bleeding MS: generalized weakness Neurolo:negative for headaches, dizziness, vertigo, some memory problems  Psych: negative for feelings of anxiety, depression  Endocrine: negative for thyroid, diabetes Allergy/Immunology- negative for any medication or food allergies ?  Objective:  VITALS:  BP (!) 139/120   Pulse (!) 45   Temp 98.3 F (36.8 C)   Resp 14   Ht 5' 4" (1.626 m)   Wt 35.5 kg   LMP  (LMP Unknown) Comment: LMP MORE THAN 5 YRS  SpO2 100%   BMI 13.43 kg/m  PHYSICAL EXAM:  General: Awake, emaciated, some confusion cooperative, no distress, .  Head: Normocephalic, without obvious abnormality, atraumatic. Eyes: Conjunctivae clear, anicteric sclerae. Pupils are equal, pale  ENT Nares normal. No drainage or sinus tenderness. Thrush present Neck: Supple, symmetrical, no adenopathy, thyroid: non tender no carotid bruit and no JVD. Back: No CVA tenderness. Lungs: b/l air entry Heart: tachycardia PORT chest wall Abdomen: Soft, rt upper quadrant drain Extremities: atraumatic, no cyanosis. No edema. No clubbing Skin: No rashes or lesions. Or bruising Lymph: Cervical, supraclavicular normal. Neurologic: Grossly non-focal Pertinent Labs Lab Results CBC    Component Value Date/Time   WBC 6.2 07/05/2019 2034   RBC 4.21 07/05/2019 2034   HGB 10.5 (L) 07/05/2019 2034   HCT 34.4 (L) 07/05/2019 2034   PLT 81 (L) 07/05/2019 2034   MCV 81.7 07/05/2019 2034   MCH 24.9 (L) 07/05/2019 2034   MCHC 30.5 07/05/2019 2034   RDW 21.1 (H)  07/05/2019 2034   LYMPHSABS 0.2 (L) 07/05/2019 2034   MONOABS 0.1 07/05/2019 2034   EOSABS 0.1 07/05/2019 2034   BASOSABS 0.1 07/05/2019 2034    CMP Latest Ref Rng & Units 07/07/2019 07/05/2019 07/02/2019  Glucose 70 - 99 mg/dL 110(H) 118(H) 159(H)  BUN 6 - 20 mg/dL 27(H) 29(H) 10  Creatinine 0.44 - 1.00 mg/dL 0.88 1.06(H) 0.76  Sodium 135 - 145 mmol/L 152(H) 148(H) 141  Potassium 3.5 - 5.1 mmol/L 2.4(LL) 2.8(L) 2.2(LL)  Chloride 98 - 111 mmol/L 116(H) 101 99  CO2 22 - 32 mmol/L _0 Calcium 8.9 - 10.3 mg/dL 7.4(L) 8.6(L) 8.2(L)  Total Protein 6.5 - 8.1 g/dL 4.6(L) 7.4 6.7  Total Bilirubin 0.3 - 1.2 mg/dL 1.2 1.4(H) 1.2  Alkaline Phos 38 - 126 U/L 156(H) 531(H) 200(H)  AST 15 - 41 U/L 36 84(H) 28  ALT 0 - 44 U/L 33 43 18      Microbiology: Recent Results (from the past 240 hour(s))  Culture, blood (Routine x 2)     Status: None (Preliminary result)   Collection Time: 07/05/19  8:30 PM   Specimen: BLOOD  Result Value Ref Range Status   Specimen Description   Final    BLOOD LEFT ANTECUBITAL Performed at [facility], [address] [place], [place] [address]    Special Requests   Final  BOTTLES DRAWN AEROBIC AND ANAEROBIC Blood Culture adequate volume Performed at [facility], [address] [place], [place] [address]    Culture  Setup Time   Final    GRAM NEGATIVE RODS IN BOTH AEROBIC AND ANAEROBIC BOTTLES CRITICAL RESULT CALLED TO, READ BACK BY AND VERIFIED WITH: SHEEMA HALLAJI 07/06/19 0900 SJL GRAM POSITIVE COCCI CRITICAL RESULT CALLED TO, READ BACK BY AND VERIFIED WITH: PHARMD K PATEL [id number] AT 8459 BY CM Performed at [facility], [place] [address] [place], [place] [address]    Culture GRAM NEGATIVE RODS GRAM POSITIVE COCCI   Final   Report Status PENDING  Incomplete  Respiratory Panel by RT PCR (Flu A&B, Covid) - Nasopharyngeal Swab     Status: None   Collection Time: 07/05/19  8:34 PM   Specimen: Nasopharyngeal Swab  Result  Value Ref Range Status   SARS Coronavirus 2 by RT PCR NEGATIVE NEGATIVE Final    Comment: (NOTE) SARS-CoV-2 target nucleic acids are NOT DETECTED. The SARS-CoV-2 RNA is generally detectable in upper respiratoy specimens during the acute phase of infection. The lowest concentration of SARS-CoV-2 viral copies this assay can detect is 131 copies/mL. A negative result does not preclude SARS-Cov-2 infection and should not be used as the sole basis for treatment or other patient management decisions. A negative result may occur with  improper specimen collection/handling, submission of specimen other than nasopharyngeal swab, presence of viral mutation(s) within the areas targeted by this assay, and inadequate number of viral copies (<131 copies/mL). A negative result must be combined with clinical observations, patient history, and epidemiological information. The expected result is Negative. Fact Sheet for Patients:  PinkCheek.be Fact Sheet for Healthcare Providers:  GravelBags.it This test is not yet ap proved or cleared by the [place] FDA and  has been authorized for detection and/or diagnosis of SARS-CoV-2 by FDA under an Emergency Use Authorization (EUA). This EUA will remain  in effect (meaning this test can be used) for the duration of the COVID-19 declaration under Section 564(b)(1) of the Act, 21 U.S.C. section 360bbb-3(b)(1), unless the authorization is terminated or revoked sooner.    Influenza A by PCR NEGATIVE NEGATIVE Final   Influenza B by PCR NEGATIVE NEGATIVE Final    Comment: (NOTE) The Xpert Xpress SARS-CoV-2/FLU/RSV assay is intended as an aid in  the diagnosis of influenza from Nasopharyngeal swab specimens and  should not be used as a sole basis for treatment. Nasal washings and  aspirates are unacceptable for Xpert Xpress SARS-CoV-2/FLU/RSV  testing. Fact Sheet for  Patients: PinkCheek.be Fact Sheet for Healthcare Providers: GravelBags.it This test is not yet approved or cleared by the [place] FDA and  has been authorized for detection and/or diagnosis of SARS-CoV-2 by  FDA under an Emergency Use Authorization (EUA). This EUA will remain  in effect (meaning this test can be used) for the duration of the  Covid-19 declaration under Section 564(b)(1) of the Act, 21  U.S.C. section 360bbb-3(b)(1), unless the authorization is  terminated or revoked. Performed at [facility], [address] [place], [place] [address]   Culture, blood (Routine x 2)     Status: Abnormal (Preliminary result)   Collection Time: 07/05/19  8:49 PM   Specimen: BLOOD  Result Value Ref Range Status   Specimen Description   Final    BLOOD PORTA CATH Performed at [facility], [address] [place], [place] [address]    Special Requests   Final  BOTTLES DRAWN AEROBIC AND ANAEROBIC Blood Culture adequate volume Performed at [facility], [address] [place], [place] [address]    Culture  Setup Time   Final    GRAM POSITIVE COCCI GRAM NEGATIVE RODS IN BOTH AEROBIC AND ANAEROBIC BOTTLES CRITICAL RESULT CALLED TO, READ BACK BY AND VERIFIED WITH: SHEEMA HALLAJI 07/06/19 0900 SJL    Culture (A)  Final    ENTEROCOCCUS FAECALIS GRAM NEGATIVE RODS CULTURE REINCUBATED FOR BETTER GROWTH Performed at [facility], [place] [address], [place], [place] [address]    Report Status PENDING  Incomplete  Blood Culture ID Panel (Reflexed)     Status: Abnormal   Collection Time: 07/05/19  8:49 PM  Result Value Ref Range Status   Enterococcus species DETECTED (A) NOT DETECTED Final    Comment: CRITICAL RESULT CALLED TO, READ BACK BY AND VERIFIED WITH: SHEEMA HALLAJI 07/06/19 0900 SJL    Vancomycin resistance NOT DETECTED NOT DETECTED Final   Listeria monocytogenes NOT DETECTED NOT  DETECTED Final   Staphylococcus species NOT DETECTED NOT DETECTED Final   Staphylococcus aureus (BCID) NOT DETECTED NOT DETECTED Final   Streptococcus species NOT DETECTED NOT DETECTED Final   Streptococcus agalactiae NOT DETECTED NOT DETECTED Final   Streptococcus pneumoniae NOT DETECTED NOT DETECTED Final   Streptococcus pyogenes NOT DETECTED NOT DETECTED Final   Acinetobacter baumannii NOT DETECTED NOT DETECTED Final   Enterobacteriaceae species DETECTED (A) NOT DETECTED Final    Comment: CRITICAL RESULT CALLED TO, READ BACK BY AND VERIFIED WITH: SHEEMA HALLAJI 07/06/19 0900 SJL    Enterobacter cloacae complex DETECTED (A) NOT DETECTED Final    Comment: CRITICAL RESULT CALLED TO, READ BACK BY AND VERIFIED WITH: SHEEMA HALLAJI 07/06/19 0900 SJL    Escherichia coli NOT DETECTED NOT DETECTED Final   Klebsiella oxytoca DETECTED (A) NOT DETECTED Final    Comment: CRITICAL RESULT CALLED TO, READ BACK BY AND VERIFIED WITH: SHEEMA HALLAJI 07/06/19 0900 SJL    Klebsiella pneumoniae NOT DETECTED NOT DETECTED Final   Proteus species NOT DETECTED NOT DETECTED Final   Serratia marcescens NOT DETECTED NOT DETECTED Final   Carbapenem resistance NOT DETECTED NOT DETECTED Final   Haemophilus influenzae NOT DETECTED NOT DETECTED Final   Neisseria meningitidis NOT DETECTED NOT DETECTED Final   Pseudomonas aeruginosa NOT DETECTED NOT DETECTED Final   Candida albicans NOT DETECTED NOT DETECTED Final   Candida glabrata NOT DETECTED NOT DETECTED Final   Candida krusei NOT DETECTED NOT DETECTED Final   Candida parapsilosis NOT DETECTED NOT DETECTED Final   Candida tropicalis NOT DETECTED NOT DETECTED Final    Comment: Performed at [facility], [address] [place], [place] [address]  Aerobic/Anaerobic Culture (surgical/deep wound)     Status: None (Preliminary result)   Collection Time: 07/06/19  2:56 PM   Specimen: Liver; Abscess  Result Value Ref Range Status   Specimen Description    Final    LIVER Performed at [facility], [address] [place], [place] [address]    Special Requests   Final    Normal Performed at [facility], [address] [place], [place] [address]    Gram Stain   Final    MODERATE WBC PRESENT, PREDOMINANTLY PMN ABUNDANT GRAM NEGATIVE RODS MODERATE GRAM POSITIVE COCCI IN CHAINS Performed at [facility], [place] [address] [place], [place] [address]    Culture PENDING  Incomplete   Report Status PENDING  Incomplete  MRSA PCR Screening  Status: None   Collection Time: 07/06/19  3:27 PM   Specimen: Nasopharyngeal  Result Value Ref Range Status   MRSA by PCR NEGATIVE NEGATIVE Final    Comment:        The GeneXpert MRSA Assay (FDA approved for NASAL specimens only), is one component of a comprehensive MRSA colonization surveillance program. It is not intended to diagnose MRSA infection nor to guide or monitor treatment for MRSA infections. Performed at [facility], [address] [place], [place] [address]     IMAGING RESULTS:   I have personally reviewed the films ? Impression/Recommendation ? ?Polymicrobial bacteremia- ( enterococcus, klebsiella, enterobacter cloacae)  likely source l CBD/liver abscess Less likely PORT Pt has CBD stent placed in Dec 2020 May need MRI of the liver GI following the patient  Currently on zosyn- continue- DC vancomycin  Metastatic colon carcinoma- followed by Dr.Brahmandy- last checkpoint inhibitor therapy in Jan 2021  Liver mets, CBD obstruction- s/p stent  Vertebral met- s/p kyphoplasty   Anemia  Hypokalemia- being corrected  Thrombocytopenia- could be due to infection   Discussed with requesting provider

## 2019-07-07 NOTE — Progress Notes (Signed)
Initial Nutrition Assessment  DOCUMENTATION CODES:   Severe malnutrition in context of chronic illness, Underweight  INTERVENTION:  Provide Boost Breeze po TID starting 2/16, each supplement provides 250 kcal and 9 grams of protein.  Once diet able to advance provide Ensure Enlive po TID, each supplement provides 350 kcal and 20 grams of protein.   Continue daily MVI.  Monitor magnesium, potassium, and phosphorus daily for at least 3 days, MD to replete as needed, as pt is at risk for refeeding syndrome given severe malnutrition.  NUTRITION DIAGNOSIS:   Severe Malnutrition related to chronic illness(stage IV colon cancer) as evidenced by 20.6% weight loss over the past year, severe fat depletion, severe muscle depletion.  GOAL:   Patient will meet greater than or equal to 90% of their needs  MONITOR:   PO intake, Supplement acceptance, Diet advancement, Labs, Weight trends, I & O's  REASON FOR ASSESSMENT:   Other (Comment)(Low BMI)    ASSESSMENT:   59 year old female with PMHx of HTN, stage IV colon cancer s/p multiple lines of therapy with liver lesions and mesenteric mass concerning for progressive disease admitted with sepsis, liver abscess s/p CT guided drainage, bacteremia.   Met with patient at bedside in ICU. She was sleepy and confused and unable to provide any history. She is currently on clear liquid diet. Unsure how much patient is able to take in at this time.   According to chart patient was 98.625 lbs on 07/08/2018. She is currently 35.5 kg (78.26 lbs). She has lost 20.365 lbs (20.6% body weight) over the past year, which is significant for time frame.  Medications reviewed and include: Oscal with D 1 tablet daily, gabapentin, MVI daily, pantoprazole, D5 with KCl 20 mEq/L at 100 mL/hr, Zosyn, vancomycin.  Labs reviewed: CBG 108, Sodium 152, Potassium 2.4, Chloride 116, BUN 27.  Discussed on rounds.  NUTRITION - FOCUSED PHYSICAL EXAM:    Most Recent Value   Orbital Region  Severe depletion  Upper Arm Region  Severe depletion  Thoracic and Lumbar Region  Severe depletion  Buccal Region  Severe depletion  Temple Region  Severe depletion  Clavicle Bone Region  Severe depletion  Clavicle and Acromion Bone Region  Severe depletion  Scapular Bone Region  Severe depletion  Dorsal Hand  Severe depletion  Patellar Region  Severe depletion  Anterior Thigh Region  Severe depletion  Posterior Calf Region  Severe depletion  Edema (RD Assessment)  None  Hair  Reviewed  Eyes  Reviewed  Mouth  Unable to assess  Skin  Reviewed  Nails  Reviewed     Diet Order:   Diet Order            Diet clear liquid Room service appropriate? Yes; Fluid consistency: Thin  Diet effective now             EDUCATION NEEDS:   Not appropriate for education at this time  Skin:  Skin Assessment: Reviewed RN Assessment  Last BM:  Unknown  Height:   Ht Readings from Last 1 Encounters:  07/06/19 5' 4"  (1.626 m)   Weight:   Wt Readings from Last 1 Encounters:  07/06/19 35.5 kg   Ideal Body Weight:  54.5 kg  BMI:  Body mass index is 13.43 kg/m.  Estimated Nutritional Needs:   Kcal:  1200-1400  Protein:  60-70 grams  Fluid:  1.2-1.4 L/day  Jacklynn Barnacle, MS, RD, LDN Pager number available on Amion

## 2019-07-07 NOTE — Assessment & Plan Note (Addendum)
#  59 year old female patient with metastatic colon cancer status post multiple lines of therapy-that admitted hospital for fever/worsening abdominal pain; CT scan suggestive of liver abscess/positive blood cultures.   # Colon cancer- cecal/ right-sided; STAGE IV-patient most recently on Opdivo plus Stivarga s/p cycle #1-; February, 15th CT scan and pelvis-8-9 cm liver abscess [see below]; however progressive lesions in the liver/also mesenteric mass-concerning for progressive disease.  Not a candidate for any further chemotherapy at this time.  #Worsening anemia hemoglobin 6.8/thrombocytopenia-anemia likely secondary to infection/underlying malignancy-agree with unit of PRBC transfusion.  Thrombocytopenia platelet in 30s-suspect secondary to antibiotics.  Discussed with ID-switch to meropenem.  Appreciate recommendations.  Check HIT antibodies-although clinically less likely.  Stop Lovenox.  Would not anticoagulate with DTI-given low chance of probability.   #Liver abscess s/p drainage-positive for bacteremia/blood cultures- on IV antibiotics; defer to primary team/ID for management.  #DNR/DNI-  #Disposition-patient family/mother interested in continued current scope of care versus hospice.  Interested in rehab placement/understand high risk of readmission-poor prognosis.  Discussed with Dr. Manuella Ghazi; also Dr. Levester Fresh.  Discussed with [employer].

## 2019-07-07 NOTE — Progress Notes (Signed)
[facility] [place] NAME: Nancy Clark    MR#:  [id number]  DATE OF BIRTH:  04-04-1961  SUBJECTIVE:  patient came in with ongoing abdominal pain was found to have liver abscess. Interventional radiology part of drain. No fever. Appears sick No vomiting REVIEW OF SYSTEMS:   Review of Systems  Constitutional: Positive for malaise/fatigue and weight loss. Negative for chills and fever.  HENT: Negative for ear discharge, ear pain and nosebleeds.   Eyes: Negative for blurred vision, pain and discharge.  Respiratory: Negative for sputum production, shortness of breath, wheezing and stridor.   Cardiovascular: Negative for chest pain, palpitations, orthopnea and PND.  Gastrointestinal: Positive for abdominal pain. Negative for diarrhea, nausea and vomiting.  Genitourinary: Negative for frequency and urgency.  Musculoskeletal: Negative for back pain and joint pain.  Neurological: Positive for weakness. Negative for sensory change, speech change and focal weakness.  Psychiatric/Behavioral: Negative for depression and hallucinations. The patient is not nervous/anxious.    Tolerating Diet:poor Tolerating PT:   DRUG ALLERGIES:  No Known Allergies  VITALS:  Blood pressure (!) 152/79, pulse (!) 52, temperature 98.3 F (36.8 C), resp. rate 17, height 5\' 4"  (1.626 m), weight 35.5 kg, SpO2 100 %.  PHYSICAL EXAMINATION:   Physical Exam  GENERAL:  59 y.o.-year-old patient lying in the bed with no acute distress. Thin cachectic debilitated chronically and critically ill appearing EYES: Pupils equal, round, reactive to light and accommodation. No scleral icterus.  Pallor+ HEENT: Head atraumatic, normocephalic. Oropharynx and nasopharynx clear.  NECK:  Supple, no jugular venous distention. No thyroid enlargement, no tenderness.  LUNGS: Normal breath sounds bilaterally, no wheezing, rales, rhonchi. No use of accessory muscles of respiration. PORT  + CARDIOVASCULAR: S1, S2 normal. No murmurs, rubs, or gallops.  ABDOMEN: Soft, +tender right Upper quadrant, nondistended. Bowel sounds present. No organomegaly or mass.  EXTREMITIES: No cyanosis, clubbing or edema b/l.    NEUROLOGIC: Cranial nerves II through XII are intact. No focal Motor or sensory deficits b/l.very weak and deconditioned   PSYCHIATRIC:  patient is alert and oriented x 3.  SKIN: No obvious rash, lesion, or ulcer.   LABORATORY PANEL:  CBC Recent Labs  Lab 07/05/19 2034  WBC 6.2  HGB 10.5*  HCT 34.4*  PLT 81*    Chemistries  Recent Labs  Lab 07/07/19 0356  NA 152*  K 2.4*  CL 116*  CO2 28  GLUCOSE 110*  BUN 27*  CREATININE 0.88  CALCIUM 7.4*  MG 2.2  AST 36  ALT 33  ALKPHOS 156*  BILITOT 1.2   Cardiac Enzymes No results for input(s): TROPONINI in the last 168 hours. RADIOLOGY:  CT ABDOMEN PELVIS W CONTRAST  Result Date: 07/05/2019 CLINICAL DATA:  Abdominal pain, fever, decreased appetite/PO intake. History of colon cancer. EXAM: CT ABDOMEN AND PELVIS WITH CONTRAST TECHNIQUE: Multidetector CT imaging of the abdomen and pelvis was performed using the standard protocol following bolus administration of intravenous contrast. CONTRAST:  63mL OMNIPAQUE IOHEXOL 300 MG/ML  SOLN COMPARISON:  CT abdomen/pelvis dated 05/02/2019 and 02/24/2019. FINDINGS: Lower chest: Lung bases are clear. Hepatobiliary: 6.9 x 6.5 x 8.5 cm abscess with intraluminal gas in the posterior right hepatic lobe (series 2/image 9). This likely transgresses right anterior liver capsule (series 2/image 21; sagittal image 15). This region of the liver includes the prior ablation zone. Given intraluminal gas and the presence of an indwelling common duct stent, communication between the abscess in the biliary tree is  suspected. At least one discrete 2.1 cm lesion is present in segment 4 B (series 2/image 23), previously 1.3 cm, favoring a metastasis. An additional 2.6 x 1.0 cm lesion anterior to the  abscess is suspected in segment 5 (series 2/image 15), progressive from the prior, but difficult to definitively distinguish from the abscess. Mild pneumobilia in the left hepatic lobe. Mild periportal edema. Intraluminal soft tissue density within the mid/distal common duct stent (coronal image 22), nonspecific. Pancreas: Dilated pancreatic duct, measuring up to 7 mm in the proximal pancreatic tail (series 2/image 25), new. No definite pancreatic atrophy. Spleen: Within normal limits. Adrenals/Urinary Tract: Adrenal glands are within normal limits. Kidneys are within normal limits.  No hydronephrosis. Thick-walled bladder, although underdistended. Stomach/Bowel: Stomach is within normal limits. No evidence of bowel obstruction. Status post right hemicolectomy with suture line in the right mid abdomen (series 2/image 38). Colon is decompressed. Vascular/Lymphatic: No evidence of abdominal aortic aneurysm. Atherosclerotic calcifications of the abdominal aorta and branch vessels. Abnormal soft tissue in the porta hepatis along the celiac axis (series 2/image 20), new/progressive from the prior, raising concern for tumor. This appearance is less typical of colon cancer and raises concern for hepatobiliary tumor such as pancreatic cancer or cholangiocarcinoma. Otherwise, no suspicious abdominopelvic lymphadenopathy. Reproductive: Uterus is within normal limits. Bilateral ovaries are unremarkable. Other: Small volume abdominopelvic ascites, including perihepatic fluid which directly communicates with the abscess, as described above. Musculoskeletal: Sclerosis involving the L2 vertebral body with prior vertebral augmentation. IMPRESSION: 8.5 cm abscess in the posterior right hepatic lobe, as described above. Abscess directly transgresses the anterior right hepatic capsule with associated perihepatic fluid and small volume abdominopelvic ascites. Communication between the abscess and biliary tree is suspected. Two  suspected hepatic lesions which are progressive from the prior and favor metastases in this patient status post right hemicolectomy for colon cancer. Abnormal soft tissue in the porta hepatis along the celiac axis, new/progressive from the prior, raising concern for tumor. This appearance is less typical of colon cancer and raises concern for hepatobiliary tumor such as pancreatic cancer or cholangiocarcinoma. Associated new dilatation of the pancreatic duct. Associated intraluminal soft tissue in the mid/distal common duct stent, nonspecific. Electronically Signed   By: Julian Hy M.D.   On: 07/05/2019 22:14   DG Chest Port 1 View  Result Date: 07/05/2019 CLINICAL DATA:  Sepsis, 2 days of weakness and decreased appetite with poor p.o. intake EXAM: PORTABLE CHEST 1 VIEW COMPARISON:  CT chest 11/21/2018 FINDINGS: There is a small patchy opacity in the right lung base which could reflect early infection versus confluence vessels and airways. No other airspace opacity is seen. No pneumothorax or effusion. Accessed left subclavian approach Port-A-Cath tip terminates in the lower SVC. The aorta is calcified. The remaining cardiomediastinal contours are unremarkable. No acute osseous or soft tissue abnormality. Postsurgical changes in the left shoulder are noted. Upper abdominal biliary stent is noted. IMPRESSION: Small patchy opacity in the right lung base which could reflect early infection versus confluence vessels and airways. Electronically Signed   By: Lovena Le M.D.   On: 07/05/2019 21:07   CT IMAGE GUIDED DRAINAGE BY PERCUTANEOUS CATHETER  Result Date: 07/06/2019 CLINICAL DATA:  Hepatic abscess and history of metastatic colon carcinoma. EXAM: CT GUIDED CATHETER DRAINAGE OF HEPATIC ABSCESS ANESTHESIA/SEDATION: 1.0 mg IV Versed 50 mcg IV Fentanyl Total Moderate Sedation Time:  19 minutes The patient's level of consciousness and physiologic status were continuously monitored during the procedure by  Radiology nursing. PROCEDURE: The  procedure, risks, benefits, and alternatives were explained to the patient. Questions regarding the procedure were encouraged and answered. The patient understands and consents to the procedure. A time out was performed prior to initiating the procedure. The right abdominal wall was prepped with chlorhexidine in a sterile fashion, and a sterile drape was applied covering the operative field. A sterile gown and sterile gloves were used for the procedure. Local anesthesia was provided with 1% Lidocaine. CT was performed through the upper abdomen in a supine position. Under CT guidance, an 18 gauge trocar needle was advanced into a right lobe hepatic abscess. Aspiration of fluid was performed. A guidewire was advanced into the collection and the needle removed. The tract was dilated and a 10 French percutaneous drainage catheter placed. Drainage catheter position was confirmed by CT. The drain was aspirated for additional fluid sample. The drainage catheter was then flushed with saline and connected to a suction bulb. The catheter was secured at the skin with a Prolene retention suture and StatLock device. COMPLICATIONS: None FINDINGS: The large right lobe hepatic abscess cavity yielded grossly purulent fluid. After placement of the 10 French drain, there is good return of brownish colored purulent fluid. IMPRESSION: CT-guided percutaneous catheter drainage of right lobe hepatic abscess yielding purulent fluid. A fluid sample was sent for culture analysis. A 10 French drainage catheter was placed and attached to suction bulb drainage. Electronically Signed   By: Aletta Edouard M.D.   On: 07/06/2019 15:14   ASSESSMENT AND PLAN:  NAKIYAH GUARDIAN is a 59 y.o. female with medical history significant of hypertension, depression, metastasized colon cancer on chemotherapy, peripheral neuropathy due to chemotherapy, thrombocytopenia, GI bleeding, biliary obstruction, who presents with  abdominal pain and fever. Patient states that she has been worsening abdominal pain in the past several days, which is located in the central abdomen, constant, moderate, sharp, nonradiating.  Sepsis due to liver abscess and bacteremia: - Patient meets criteria sepsis with fever, tachycardia, elevated lactic acid up to 8.8, which is normalized with IV fluid currently.  -Blood culture is 4 of 4 positive: 2 of 4 GPC and 4 of 4 GNR. BCID: Klebsiella oxytoca, Enterobacter cloacae complex, and Enterococcus species. -Dr. Kathlene Cote of IR was consulted for drainage -->s/p of drainage, culture is sent out -IV zosyn and Vancomycin (patient received 1 dose of cefepime, vancomycin and Flagyl in ED) -Follow-up of blood culture enterococcus fecalis  Cancer of ascending colon metastatic to intra-abdominal lymph node, liver [facility]):  s/p of chemotherapy. -  Patient is also on Stivarga, but did not take it in the past 48 hours. -Patient has very poor performance status. Spoke with Dr. Rogue Bussing and Merrily Pew borders nurse practitioner plan to meet patient's mother and husband to discuss current medical condition.  Hypernatremia /Hypokalemia: K= 2.8  on admission. -Pharmacy consult placed -D5 water with potassium  Thrombocytopenia ([place]): Likely due to chemotherapy.  No active bleeding tendency -f/u by CBC  Normocytic anemia: Hemoglobin stable.  10.5 -Follow-up with CBC  Essential hypertension: -hold Bp meds due to softer blood pressure and sepsis-- now blood pressure going up will resume clonidine patches and amlodipine  Elevated troponin: trop 104 -->113 -->56. No CP.  Likely due to demand ischemia.  Overall poor prognosis  Procedures: IR drained liver abcess Family communication : pt's mother has been updated by Merrily Pew, NP palliative care Consults :ID, oncology Discharge Disposition :TBD CODE STATUS: Full code DVT Prophylaxis :lovneonx Barriers to discharge:none  TOTAL TIME TAKING CARE OF THIS  PATIENT: *  35* minutes.  >50% time spent on counselling and coordination of care  Note: This dictation was prepared with Dragon dictation along with smaller phrase technology. Any transcriptional errors that result from this process are unintentional.  Fritzi Mandes M.D    [facility]   CC: Primary care physician; Patient, No Pcp PerPatient ID: Nancy Clark, female   DOB: 02-12-1961, 59 y.o.   MRN: [id number]

## 2019-07-07 NOTE — Progress Notes (Signed)
[place] for Electrolyte Monitoring and Replacement   Recent Labs: Potassium (mmol/L)  Date Value  07/07/2019 3.1 (L)   Magnesium (mg/dL)  Date Value  07/07/2019 2.2   Calcium (mg/dL)  Date Value  07/07/2019 7.4 (L)   Albumin (g/dL)  Date Value  07/07/2019 1.6 (L)   Phosphorus (mg/dL)  Date Value  07/07/2019 2.6   Sodium (mmol/L)  Date Value  07/07/2019 152 (H)     Assessment: 59 year old female admitted with weakness and abdominal pain. Patient with positive blood cultures and hepatic abscess s/p drain placement. Patient with electrolyte abnormalities on presentation. Patient at refeeding risk. Pharmacy consulted to manage.  Goal of Therapy:  Electrolytes WNL  Plan:  Phos: 2.6, K 3.1. Patient is receiving  D5 w/ 20 K at 100 ml/hr.   Will order KCL 29mEq IV x 4 doses.   Will F/U with AM labs and continue to replace as needed.    Will continue to monitor Na and glucose closely for necessary adjustments.  Pernell Dupre, PharmD, BCPS Clinical Pharmacist 07/07/2019 6:46 PM

## 2019-07-08 ENCOUNTER — Other Ambulatory Visit: Payer: Self-pay

## 2019-07-08 DIAGNOSIS — C787 Secondary malignant neoplasm of liver and intrahepatic bile duct: Secondary | ICD-10-CM

## 2019-07-08 LAB — BASIC METABOLIC PANEL
Anion gap: 6 (ref 5–15)
BUN: 20 mg/dL (ref 6–20)
CO2: 29 mmol/L (ref 22–32)
Calcium: 8.1 mg/dL — ABNORMAL LOW (ref 8.9–10.3)
Chloride: 111 mmol/L (ref 98–111)
Creatinine, Ser: 0.73 mg/dL (ref 0.44–1.00)
GFR calc Af Amer: >60 mL/min (ref >60)
GFR calc non Af Amer: >60 mL/min (ref >60)
Glucose, Bld: 246 mg/dL — ABNORMAL HIGH (ref 70–99)
Potassium: 3.3 mmol/L — ABNORMAL LOW (ref 3.5–5.1)
Sodium: 146 mmol/L — ABNORMAL HIGH (ref 135–145)

## 2019-07-08 LAB — PHOSPHORUS: Phosphorus: 1 mg/dL — CL (ref 2.5–4.6)

## 2019-07-08 LAB — MAGNESIUM: Magnesium: 1.9 mg/dL (ref 1.7–2.4)

## 2019-07-08 LAB — GLUCOSE, CAPILLARY: Glucose-Capillary: 186 mg/dL — ABNORMAL HIGH (ref 70–99)

## 2019-07-08 MED ORDER — POTASSIUM PHOSPHATES 15 MMOLE/5ML IV SOLN
20.0000 mmol | Freq: Once | INTRAVENOUS | Status: AC
  Administered 2019-07-08: 20 mmol via INTRAVENOUS
  Filled 2019-07-08: qty 6.67

## 2019-07-08 MED ORDER — FENTANYL 50 MCG/HR TD PT72
1.0000 | MEDICATED_PATCH | TRANSDERMAL | Status: DC
  Administered 2019-07-08: 16:00:00 1 via TRANSDERMAL
  Filled 2019-07-08: qty 1

## 2019-07-08 MED ORDER — ENOXAPARIN SODIUM 30 MG/0.3ML ~~LOC~~ SOLN
30.0000 mg | SUBCUTANEOUS | Status: DC
  Administered 2019-07-09: 30 mg via SUBCUTANEOUS
  Filled 2019-07-08: qty 0.3

## 2019-07-08 MED ORDER — POTASSIUM CHLORIDE IN NACL 20-0.45 MEQ/L-% IV SOLN
INTRAVENOUS | Status: DC
  Filled 2019-07-08 (×7): qty 1000

## 2019-07-08 NOTE — Progress Notes (Signed)
Pt transferred to Rm # [id number] at this time. Family aware. VSS prior to transfer on room air.

## 2019-07-08 NOTE — Progress Notes (Signed)
[place]  [phone] [phone]   Name: Nancy Clark Date: 07/08/2019 MRN: [id number]  DOB: 06-Jan-1961  Patient Care Team: Patient, No Pcp Per as PCP - General (General Practice)    REASON FOR CONSULTATION: FLORAINE BUECHLER a 59 y.o.femalewith multiple medical problems including stage IV colorectal cancer status post right hemicolectomy and XRT. She is also status post liver ablation to area of hepatic metastasis. Patient isstatus post5-FU plus bevacizumaband is now on treatment with nivolumab. Recent CT scan on 05/02/2019 revealed new biliary obstruction at the distal CBD due to compressive malignancy with biliary obstruction of the left and right hepatic lobes with 3 new hepatic lesions. Patient underwent biliary stenting on 05/05/2019. Patient has had rising CEA levels. Opdivo has been held due to fevers.   Patient was admitted to the hospital on 07/06/2019 with fever and abdominal pain.  CT of abdomen/pelvis revealed an 8.5 cm abscess in the posterior right hepatic lobe.  Patient was also found to have progressive hepatic lesions with abnormal soft tissue in the porta hepatis concerning for tumor.  Patient is status post CT-guided drain of her hepatic abscess.  She was referred to palliative care to help discuss goals and manage ongoing symptoms.   CODE STATUS: Full code  PAST MEDICAL HISTORY: Past Medical History:  Diagnosis Date  . Chicken pox   . Colon cancer ([place])    Partial colectomy 07/2015 + chemo tx's  . Hypertension   . Hypokalemia   . Menopause    > 5 yrs  . Peripheral neuropathy due to chemotherapy ([place])    bi lat feet    PAST SURGICAL HISTORY:  Past Surgical History:  Procedure Laterality Date  . BILIARY STENT PLACEMENT  05/05/2019   Procedure: BILIARY STENT PLACEMENT;  Surgeon: Ronnette Juniper, MD;  Location: [facility];  Service: Gastroenterology;;  . BREAST BIOPSY Right    2007? pt unsure done by [facility]  . COLONOSCOPY    . COLONOSCOPY WITH PROPOFOL N/A 10/14/2015   Procedure: COLONOSCOPY WITH PROPOFOL;  Surgeon: Hulen Luster, MD;  Location: [facility] ENDOSCOPY;  Service: Endoscopy;  Laterality: N/A;  . COLOSTOMY REVISION Right 08/09/2015   Procedure: COLON RESECTION RIGHT;  Surgeon: Florene Glen, MD;  Location: [facility] ORS;  Service: General;  Laterality: Right;  . ECTOPIC PREGNANCY SURGERY    . ERCP N/A 05/05/2019   Procedure: ENDOSCOPIC RETROGRADE CHOLANGIOPANCREATOGRAPHY (ERCP);  Surgeon: Ronnette Juniper, MD;  Location: [place];  Service: Gastroenterology;  Laterality: N/A;  . IR BONE TUMOR(S)RF ABLATION  05/24/2018  . IR KYPHO LUMBAR INC FX REDUCE BONE BX UNI/BIL CANNULATION INC/IMAGING  05/24/2018  . IR RADIOLOGIST EVAL & MGMT  05/08/2018  . IR RADIOLOGIST EVAL & MGMT  06/18/2018  . IR RADIOLOGIST EVAL & MGMT  07/31/2018  . IR RADIOLOGIST EVAL & MGMT  12/19/2018  . IR RADIOLOGIST EVAL & MGMT  02/27/2019  . PANCREATIC STENT PLACEMENT  05/05/2019   Procedure: PANCREATIC STENT PLACEMENT;  Surgeon: Ronnette Juniper, MD;  Location: [facility] ENDOSCOPY;  Service: Gastroenterology;;  . Sol Passer PLACEMENT N/A 09/01/2015   Procedure: INSERTION PORT-A-CATH;  Surgeon: Florene Glen, MD;  Location: [facility] ORS;  Service: General;  Laterality: N/A;  . RADIOLOGY WITH ANESTHESIA N/A 11/06/2018   Procedure: CT-MICROWAVE THERMAL ABLATION/LIVER;  Surgeon: Corrie Mckusick, DO;  Location: [facility] ORS;  Service: Anesthesiology;  Laterality: N/A;  . REMOVAL OF STONES  05/05/2019   Procedure: REMOVAL OF STONES;  Surgeon: Ronnette Juniper, MD;  Location: [facility] ENDOSCOPY;  Service: Gastroenterology;;  . SHOULDER SURGERY Left   . SPHINCTEROTOMY  05/05/2019   Procedure: SPHINCTEROTOMY;  Surgeon: Ronnette Juniper, MD;  Location: [facility] ENDOSCOPY;  Service: Gastroenterology;;    HEMATOLOGY/ONCOLOGY HISTORY:  Oncology History Overview Note  # FEB-MARCH 2017- COLON CANCER- STAGE IV [s/p right hemi-colectomy; pT4apN2 (7/19LN)]; Pre-op  CEA- [id number]  PET-  mesenteric adenopathy/mediastinal adenopathy/right-sided subclavicular lymph node.    April 17th -START FOLFOX; May 1st Add Avastin;   Aug 16th- Disc OX [sec PN]; con 5FU-Avastin; NOV 27th CT C/A/P- CR; except for 79m LUL; 5FU-Avastin q 3 W [Poor tolerance to FOLFOX].  # June/July 2019- Progression of Liver/Abd LN; July 2019- START FOLFIRI + Avastin; STOP iri 02/25/2018- sec to diarrhea; cont Avastin + 5FU  # March 2018- L2 uptake MET s/p RT [[facility]2018]; mid April X-geva  # L1 ablation/ostecool-  Jan 3rd 2020-   # march 2nd 2020- decrease 5FU CIV  to 20046mm2/  #June 17th, 2020-liver ablation; 3.5 cm lesion.  Intolerance to 5-FU plus Avastin.  Discontinued; Side effects/weight loss/ quality of life  #July 13th -Lonsurf cycle #1; x2 cycles; September 2020-progressive disease in the mesenteric region; liver disease stable;  #March 05, 2019-5-FU plus Avastin [peripheral neuropathy/diarrhea]; DEC 2020- Progression,  # JAN 2021- OPDIVO + REGO  # July 2nd CT scan-right laryngeal nerve palsy/hoarseness of voice [Dr. McQueen] CT-chest NEG; MRI brain negative.  # MOLECULAR TESTING: K-ras-EXON 2- MUTATED; MSI- STABLE.   # palliative care- [name].   DIAGNOSIS: colon ca  STAGE:  IV ;GOALS: pallaitive  CURRENT/MOST RECENT THERAPY: Opdivo [C]+ Regorefenib.     Cancer of ascending colon metastatic to intra-abdominal lymph node ([place] 05/28/2019 -  Chemotherapy   The patient had nivolumab (OPDIVO) 240 mg in sodium chloride 0.9 % 100 mL chemo infusion, 240 mg, Intravenous, Once, 2 of 6 cycles Administration: 240 mg (05/28/2019), 240 mg (06/11/2019)  for chemotherapy treatment.      ALLERGIES:  has No Known Allergies.  MEDICATIONS:  Current Facility-Administered Medications  Medication Dose Route Frequency Provider Last Rate Last Admin  . 0.45 % NaCl with KCl 20 mEq / L infusion   Intravenous Continuous PaFritzi MandesMD 75 mL/hr at 07/08/19 1151 Rate Verify at  07/08/19 1151  . acetaminophen (TYLENOL) tablet 650 mg  650 mg Oral Q6H PRN PaFritzi MandesMD      . amLODipine (NORVASC) tablet 10 mg  10 mg Oral Daily PaFritzi MandesMD   10 mg at 07/08/19 0956  . calcium-vitamin D (OSCAL WITH D) 500-200 MG-UNIT per tablet 1 tablet  1 tablet Oral q1800 SiCharlett NoseRPH   1 tablet at 07/07/19 1706  . Chlorhexidine Gluconate Cloth 2 % PADS 6 each  6 each Topical Daily NiIvor CostaMD   6 each at 07/07/19 1011  . cloNIDine (CATAPRES - Dosed in mg/24 hr) patch 0.1 mg  0.1 mg Transdermal Q Mon-1800 PaFritzi MandesMD   0.1 mg at 07/07/19 1707  . diphenoxylate-atropine (LOMOTIL) 2.5-0.025 MG per tablet 1 tablet  1 tablet Oral QID PRN NiIvor CostaMD      . DULoxetine (CYMBALTA) DR capsule 40 mg  40 mg Oral Daily NiIvor CostaMD   40 mg at 07/08/19 0956  . enoxaparin (LOVENOX) injection 30 mg  30 mg Subcutaneous Q24H PaFritzi MandesMD      . feeding supplement (BOOST / RESOURCE BREEZE) liquid 1 Container  1 Container Oral TID BM PaFritzi MandesMD   1  Container at 07/08/19 0955  . fentaNYL (DURAGESIC) 50 MCG/HR 1 patch  1 patch Transdermal Q72H Fritzi Mandes, MD      . gabapentin (NEURONTIN) capsule 300 mg  300 mg Oral Daily Charlett Nose, RPH   300 mg at 07/08/19 0956  . gabapentin (NEURONTIN) capsule 600 mg  600 mg Oral QHS Charlett Nose, RPH   600 mg at 07/07/19 2141  . ibuprofen (ADVIL) tablet 200 mg  200 mg Oral Q6H PRN Ivor Costa, MD      . magic mouthwash  10 mL Oral QID Fritzi Mandes, MD   10 mL at 07/08/19 0955   And  . lidocaine (XYLOCAINE) 2 % viscous mouth solution 10 mL  10 mL Mouth/Throat QID Fritzi Mandes, MD   10 mL at 07/08/19 0955  . loperamide (IMODIUM) capsule 2 mg  2 mg Oral QID PRN Ivor Costa, MD      . menthol-cetylpyridinium (CEPACOL) lozenge 3 mg  1 lozenge Oral PRN Fritzi Mandes, MD      . morphine 2 MG/ML injection 1 mg  1 mg Intravenous Q2H PRN Darel Hong D, NP   1 mg at 07/06/19 2040  . multivitamin with minerals tablet 1 tablet  1 tablet  Oral Daily Ivor Costa, MD   1 tablet at 07/08/19 0956  . ondansetron (ZOFRAN) injection 4 mg  4 mg Intravenous Q8H PRN Ivor Costa, MD   4 mg at 07/08/19 1005  . oxyCODONE (Oxy IR/ROXICODONE) immediate release tablet 5 mg  5 mg Oral Q6H PRN Ivor Costa, MD      . pantoprazole (PROTONIX) EC tablet 40 mg  40 mg Oral Q1200 Ivor Costa, MD   40 mg at 07/07/19 1158  . piperacillin-tazobactam (ZOSYN) IVPB 3.375 g  3.375 g Intravenous Q8H Pernell Dupre, RPH   Stopped at 07/08/19 0954  . potassium PHOSPHATE 20 mmol in dextrose 5 % 500 mL infusion  20 mmol Intravenous Once Fritzi Mandes, MD 85 mL/hr at 07/08/19 1151 Rate Verify at 07/08/19 1151  . sodium chloride flush (NS) 0.9 % injection 5 mL  5 mL Intracatheter Q8H Aletta Edouard, MD   5 mL at 07/07/19 2141   Facility-Administered Medications Ordered in Other Encounters  Medication Dose Route Frequency Provider Last Rate Last Admin  . sodium chloride flush (NS) 0.9 % injection 10 mL  10 mL Intravenous PRN Cammie Sickle, MD   10 mL at 10/04/15 0901  . sodium chloride flush (NS) 0.9 % injection 10 mL  10 mL Intravenous PRN Charlaine Dalton R, MD   10 mL at 07/02/17 1020    VITAL SIGNS: BP (!) 153/81   Pulse 62   Temp 98.6 F (37 C) (Oral)   Resp 18   Ht 5' 4" (1.626 m)   Wt 78 lb 4.2 oz (35.5 kg)   LMP  (LMP Unknown) Comment: LMP MORE THAN 5 YRS  SpO2 99%   BMI 13.43 kg/m  Filed Weights   07/05/19 2007 07/06/19 1522  Weight: 74 lb 8.3 oz (33.8 kg) 78 lb 4.2 oz (35.5 kg)    Estimated body mass index is 13.43 kg/m as calculated from the following:   Height as of this encounter: 5' 4" (1.626 m).   Weight as of this encounter: 78 lb 4.2 oz (35.5 kg).  LABS: CBC:    Component Value Date/Time   WBC 6.2 07/05/2019 2034   HGB 10.5 (L) 07/05/2019 2034   HCT 34.4 (L) 07/05/2019 2034   PLT  81 (L) 07/05/2019 2034   MCV 81.7 07/05/2019 2034   NEUTROABS 5.7 07/05/2019 2034   LYMPHSABS 0.2 (L) 07/05/2019 2034   MONOABS 0.1  07/05/2019 2034   EOSABS 0.1 07/05/2019 2034   BASOSABS 0.1 07/05/2019 2034   Comprehensive Metabolic Panel:    Component Value Date/Time   NA 146 (H) 07/08/2019 0321   K 3.3 (L) 07/08/2019 0321   CL 111 07/08/2019 0321   CO2 29 07/08/2019 0321   BUN 20 07/08/2019 0321   CREATININE 0.73 07/08/2019 0321   GLUCOSE 246 (H) 07/08/2019 0321   CALCIUM 8.1 (L) 07/08/2019 0321   AST 36 07/07/2019 0356   ALT 33 07/07/2019 0356   ALKPHOS 156 (H) 07/07/2019 0356   BILITOT 1.2 07/07/2019 0356   PROT 4.6 (L) 07/07/2019 0356   ALBUMIN 1.6 (L) 07/07/2019 0356    RADIOGRAPHIC STUDIES: CT ABDOMEN PELVIS W CONTRAST  Result Date: 07/05/2019 CLINICAL DATA:  Abdominal pain, fever, decreased appetite/PO intake. History of colon cancer. EXAM: CT ABDOMEN AND PELVIS WITH CONTRAST TECHNIQUE: Multidetector CT imaging of the abdomen and pelvis was performed using the standard protocol following bolus administration of intravenous contrast. CONTRAST:  75m OMNIPAQUE IOHEXOL 300 MG/ML  SOLN COMPARISON:  CT abdomen/pelvis dated 05/02/2019 and 02/24/2019. FINDINGS: Lower chest: Lung bases are clear. Hepatobiliary: 6.9 x 6.5 x 8.5 cm abscess with intraluminal gas in the posterior right hepatic lobe (series 2/image 9). This likely transgresses right anterior liver capsule (series 2/image 21; sagittal image 15). This region of the liver includes the prior ablation zone. Given intraluminal gas and the presence of an indwelling common duct stent, communication between the abscess in the biliary tree is suspected. At least one discrete 2.1 cm lesion is present in segment 4 B (series 2/image 23), previously 1.3 cm, favoring a metastasis. An additional 2.6 x 1.0 cm lesion anterior to the abscess is suspected in segment 5 (series 2/image 15), progressive from the prior, but difficult to definitively distinguish from the abscess. Mild pneumobilia in the left hepatic lobe. Mild periportal edema. Intraluminal soft tissue density  within the mid/distal common duct stent (coronal image 22), nonspecific. Pancreas: Dilated pancreatic duct, measuring up to 7 mm in the proximal pancreatic tail (series 2/image 25), new. No definite pancreatic atrophy. Spleen: Within normal limits. Adrenals/Urinary Tract: Adrenal glands are within normal limits. Kidneys are within normal limits.  No hydronephrosis. Thick-walled bladder, although underdistended. Stomach/Bowel: Stomach is within normal limits. No evidence of bowel obstruction. Status post right hemicolectomy with suture line in the right mid abdomen (series 2/image 38). Colon is decompressed. Vascular/Lymphatic: No evidence of abdominal aortic aneurysm. Atherosclerotic calcifications of the abdominal aorta and branch vessels. Abnormal soft tissue in the porta hepatis along the celiac axis (series 2/image 20), new/progressive from the prior, raising concern for tumor. This appearance is less typical of colon cancer and raises concern for hepatobiliary tumor such as pancreatic cancer or cholangiocarcinoma. Otherwise, no suspicious abdominopelvic lymphadenopathy. Reproductive: Uterus is within normal limits. Bilateral ovaries are unremarkable. Other: Small volume abdominopelvic ascites, including perihepatic fluid which directly communicates with the abscess, as described above. Musculoskeletal: Sclerosis involving the L2 vertebral body with prior vertebral augmentation. IMPRESSION: 8.5 cm abscess in the posterior right hepatic lobe, as described above. Abscess directly transgresses the anterior right hepatic capsule with associated perihepatic fluid and small volume abdominopelvic ascites. Communication between the abscess and biliary tree is suspected. Two suspected hepatic lesions which are progressive from the prior and favor metastases in this patient status post right  hemicolectomy for colon cancer. Abnormal soft tissue in the porta hepatis along the celiac axis, new/progressive from the prior,  raising concern for tumor. This appearance is less typical of colon cancer and raises concern for hepatobiliary tumor such as pancreatic cancer or cholangiocarcinoma. Associated new dilatation of the pancreatic duct. Associated intraluminal soft tissue in the mid/distal common duct stent, nonspecific. Electronically Signed   By: Julian Hy M.D.   On: 07/05/2019 22:14   DG Chest Port 1 View  Result Date: 07/05/2019 CLINICAL DATA:  Sepsis, 2 days of weakness and decreased appetite with poor p.o. intake EXAM: PORTABLE CHEST 1 VIEW COMPARISON:  CT chest 11/21/2018 FINDINGS: There is a small patchy opacity in the right lung base which could reflect early infection versus confluence vessels and airways. No other airspace opacity is seen. No pneumothorax or effusion. Accessed left subclavian approach Port-A-Cath tip terminates in the lower SVC. The aorta is calcified. The remaining cardiomediastinal contours are unremarkable. No acute osseous or soft tissue abnormality. Postsurgical changes in the left shoulder are noted. Upper abdominal biliary stent is noted. IMPRESSION: Small patchy opacity in the right lung base which could reflect early infection versus confluence vessels and airways. Electronically Signed   By: Lovena Le M.D.   On: 07/05/2019 21:07   CT IMAGE GUIDED DRAINAGE BY PERCUTANEOUS CATHETER  Result Date: 07/06/2019 CLINICAL DATA:  Hepatic abscess and history of metastatic colon carcinoma. EXAM: CT GUIDED CATHETER DRAINAGE OF HEPATIC ABSCESS ANESTHESIA/SEDATION: 1.0 mg IV Versed 50 mcg IV Fentanyl Total Moderate Sedation Time:  19 minutes The patient's level of consciousness and physiologic status were continuously monitored during the procedure by Radiology nursing. PROCEDURE: The procedure, risks, benefits, and alternatives were explained to the patient. Questions regarding the procedure were encouraged and answered. The patient understands and consents to the procedure. A time out was  performed prior to initiating the procedure. The right abdominal wall was prepped with chlorhexidine in a sterile fashion, and a sterile drape was applied covering the operative field. A sterile gown and sterile gloves were used for the procedure. Local anesthesia was provided with 1% Lidocaine. CT was performed through the upper abdomen in a supine position. Under CT guidance, an 18 gauge trocar needle was advanced into a right lobe hepatic abscess. Aspiration of fluid was performed. A guidewire was advanced into the collection and the needle removed. The tract was dilated and a 10 French percutaneous drainage catheter placed. Drainage catheter position was confirmed by CT. The drain was aspirated for additional fluid sample. The drainage catheter was then flushed with saline and connected to a suction bulb. The catheter was secured at the skin with a Prolene retention suture and StatLock device. COMPLICATIONS: None FINDINGS: The large right lobe hepatic abscess cavity yielded grossly purulent fluid. After placement of the 10 French drain, there is good return of brownish colored purulent fluid. IMPRESSION: CT-guided percutaneous catheter drainage of right lobe hepatic abscess yielding purulent fluid. A fluid sample was sent for culture analysis. A 10 French drainage catheter was placed and attached to suction bulb drainage. Electronically Signed   By: Aletta Edouard M.D.   On: 07/06/2019 15:14    PERFORMANCE STATUS (ECOG) : 4 - Bedbound  Review of Systems Unless otherwise noted, a complete review of systems is negative.  Physical Exam General: Ill-appearing Cardiovascular: regular rate and rhythm Pulmonary: Unlabored Extremities: no edema, no joint deformities Skin: no rashes Neurological: Weakness, confusion  IMPRESSION: No significant changes overnight.  Patient remains in stepdown with  some confusion.  Dr. Rogue Bussing and I met with patient's husband and mother.  Together, we reviewed  patient's current medical problems.  We discussed her overall poor prognosis and that cancer treatment would be limited by her ongoing infection.  We also discussed the possibility for future decline and that patient could be nearing end-of-life.  We discussed CODE STATUS.  Patient's mother verbalized feeling like CPR and other aggressive measures would not be in patient's best interest.  However, patient's husband said that the family would need to talk about those decisions.  Family seem committed to the current scope of treatment.  They were not prepared to make any decisions today and wanted time to process the information provided in our meeting.  PLAN: -Continue current scope of treatment -Full code/full scope for now -Will follow   Time Total: 45 minutes  Visit consisted of counseling and education dealing with the complex and emotionally intense issues of symptom management and palliative care in the setting of serious and potentially life-threatening illness.Greater than 50%  of this time was spent counseling and coordinating care related to the above assessment and plan.  Signed by: Altha Harm, PhD, NP-C

## 2019-07-08 NOTE — TOC Initial Note (Signed)
Transition of Care [facility]) - Initial/Assessment Note    Patient Details  Name: Nancy Clark MRN: [id number] Date of Birth: Oct 10, 1960  Transition of Care [facility]) CM/SW Contact:    Nancy Ivan, LCSW Phone Number: 07/08/2019, 3:32 PM  Clinical Narrative:   CSW completed assessment with patient's mother, Nancy Clark, via phone. Nancy Clark reported patient lives with her husband, Nancy Clark, and 2 sons. Nancy Clark and Nancy Clark are supportive of patient's needs. Nancy Clark [facility] [place] and denied issues with obtaining medications. Nancy Clark denied patient having any history of needing home health services, SNF placements, or DME. Nancy Clark reported patient drives herself to appointments and family is able to assist with this if needed. Nancy Clark was unsure of patient's PCP and deferred to [facility] for this info. CSW called Nancy Clark who reported he is not sure who patient's PCP is, he reported he will call back and let CSW know if/when he finds this information. Nancy Clark and Nancy Clark were encouraged to reach out with any needs regarding patient. CSW will continue to follow.                  Expected Discharge Plan: Home/Self Care Barriers to Discharge: Continued Medical Work up   Patient Goals and CMS Choice        Expected Discharge Plan and Services Expected Discharge Plan: Home/Self Care   Discharge Planning Services: CM Consult   Living arrangements for the past 2 months: Single Family Home                                      Prior Living Arrangements/Services Living arrangements for the past 2 months: Single Family Home Lives with:: Spouse Patient language and need for interpreter reviewed:: Yes Do you feel safe going back to the place where you live?: Yes      Need for Family Participation in Patient Care: Yes (Comment) Care giver support system in place?: Yes (comment)   Criminal Activity/Legal Involvement Pertinent to Current Situation/Hospitalization: No - Comment  as needed  Activities of Daily Living      Permission Sought/Granted                  Emotional Assessment         Alcohol / Substance Use: Not Applicable Psych Involvement: No (comment)  Admission diagnosis:  Abscess, hepatic [K75.0] Liver abscess [K75.0] Sepsis without acute organ dysfunction, due to unspecified organism [facility]) [A41.9] Patient Active Problem List   Diagnosis Date Noted  . Protein-calorie malnutrition, severe 07/07/2019  . Palliative care encounter   . Enterococcal sepsis ([place])   . Sepsis ([place]) 07/06/2019  . Liver abscess 07/06/2019  . Elevated troponin 07/06/2019  . Bacteremia 07/06/2019  . Biliary obstruction 05/03/2019  . Metastases to the liver ([place]) 05/03/2019  . History of colon cancer 05/03/2019  . Hyperbilirubinemia 05/03/2019  . Elevated liver enzymes 05/03/2019  . Thrombocytopenia ([place]) 05/03/2019  . Normocytic anemia 05/03/2019  . Essential hypertension 05/03/2019  . Liver mass, right lobe 11/06/2018  . Goals of care, counseling/discussion 10/19/2018  . Cancer, metastatic to bone ([place]) 07/31/2016  . Encounter for antineoplastic chemotherapy 12/07/2015  . Malnutrition of moderate degree 10/13/2015  . Gastrointestinal bleeding 10/12/2015  . Pancytopenia due to chemotherapy ([place]) 10/12/2015  . Hypokalemia 10/12/2015  . Cancer of ascending colon metastatic to intra-abdominal lymph node ([place]) 09/06/2015  . Preventative health care 06/08/2015   PCP:  Patient, No Pcp Per Pharmacy:   [id number] [place] [place] [place] [address] Phone: [phone] Fax: [phone]  [place], [place] [id number] [place] [address] Phone: [phone] Fax: [phone]  [facility] [place], [place] [address] [place] [address] Phone: [phone] Fax: [place], [place] [id number] [place] [id number] Phone: [phone] Fax: [phone]     Social Determinants of Health (SDOH) Interventions    Readmission Risk Interventions Readmission Risk Prevention Plan 07/08/2019 05/06/2019  Transportation Screening Complete Complete  HRI or [place] - Complete  Social Work Consult for [place] Planning/Counseling - Complete  Palliative Care Screening - Not Applicable  Medication Review Press photographer) Complete Complete  HRI or Home Care Consult Complete -  Palliative Care Screening Complete -  Some recent data might be hidden

## 2019-07-08 NOTE — Evaluation (Signed)
Physical Therapy Evaluation Patient Details Name: Nancy Clark MRN: [id number] DOB: Apr 23, 1961 Today's Date: 07/08/2019   History of Present Illness  Pt admitted for liver abscess with mets with complaints of abdominal pain. History includes HTN, depression, colon ca on chemo, neuropathy, and GI bleed  Clinical Impression  Pt is a pleasant 59 year old female who was admitted for liver abscess with mets. Pt performs bed mobility with cga, transfers with min assist, and ambulation with min assist and HHA. Refusing further mobility at this time. Pt demonstrates deficits with strength/mobility/endurance. Pt appears frail and debilitated. Would benefit from skilled PT to address above deficits and promote optimal return to PLOF. Recommend transition to [facility] upon discharge from acute hospitalization.     Follow Up Recommendations Home health PT;Supervision/Assistance - 24 hour    Equipment Recommendations  Rolling walker with 5" wheels    Recommendations for Other Services       Precautions / Restrictions Precautions Precautions: Fall Restrictions Weight Bearing Restrictions: No      Mobility  Bed Mobility Overal bed mobility: Needs Assistance Bed Mobility: Supine to Sit     Supine to sit: Min guard     General bed mobility comments: needs cues for sequencing. Able to intiate movement to EOB. Once seated, kyphotic posture noted.  Transfers Overall transfer level: Needs assistance Equipment used: None Transfers: Sit to/from Stand Sit to Stand: Min assist         General transfer comment: needs assist for sequencing. Once standing forward flexed posture noted, however generally steady.  Ambulation/Gait Ambulation/Gait assistance: Min assist Gait Distance (Feet): 5 Feet Assistive device: 1 person hand held assist Gait Pattern/deviations: Step-to pattern     General Gait Details: ambulated with HHA short distance to recliner. Small step length and fatigues quickly.  Refuses further ambulation at this time due to fatigue and abdominal pain. May benefit from further gait training with AD  Stairs            Wheelchair Mobility    Modified Rankin (Stroke Patients Only)       Balance Overall balance assessment: Needs assistance Sitting-balance support: Feet supported;Bilateral upper extremity supported Sitting balance-Leahy Scale: Good     Standing balance support: Single extremity supported Standing balance-Leahy Scale: Fair                               Pertinent Vitals/Pain Pain Assessment: Faces Faces Pain Scale: Hurts little more Pain Location: abdomen Pain Descriptors / Indicators: Aching Pain Intervention(s): Limited activity within patient's tolerance    Home Living Family/patient expects to be discharged to:: Private residence Living Arrangements: Spouse/significant other(and mom) Available Help at Discharge: Family;Available 24 hours/day Type of Home: House Home Access: Stairs to enter   [employer] of Steps: pt unable to remember how many stairs but reports her husband helps her get up the stairs Home Layout: One level Home Equipment: Walker - 2 wheels;Cane - single point      Prior Function Level of Independence: Independent with assistive device(s)         Comments: reports she was previously indep, however recently started using a SPC. Was driving up to admission     Hand Dominance        Extremity/Trunk Assessment   Upper Extremity Assessment Upper Extremity Assessment: Generalized weakness(B UE grossly 3+/5)    Lower Extremity Assessment Lower Extremity Assessment: Generalized weakness(B LE grossly 3-/5)  Communication   Communication: No difficulties  Cognition Arousal/Alertness: Awake/alert Behavior During Therapy: WFL for tasks assessed/performed Overall Cognitive Status: Impaired/Different from baseline                                 General  Comments: confused to date, oriented to place      General Comments      Exercises Other Exercises Other Exercises: Transferred to [facility] with cga. Needs set up and supervision for hygiene. Safe technique performed.    Assessment/Plan    PT Assessment Patient needs continued PT services  PT Problem List Decreased strength;Decreased activity tolerance;Decreased balance;Decreased mobility;Decreased knowledge of use of DME       PT Treatment Interventions Gait training;DME instruction;Therapeutic exercise;Balance training    PT Goals (Current goals can be found in the Care Plan section)  Acute Rehab PT Goals Patient Stated Goal: to go home PT Goal Formulation: With patient Time For Goal Achievement: 07/22/19 Potential to Achieve Goals: Good    Frequency Min 2X/week   Barriers to discharge        Co-evaluation               AM-PAC PT "6 Clicks" Mobility  Outcome Measure Help needed turning from your back to your side while in a flat bed without using bedrails?: A Little Help needed moving from lying on your back to sitting on the side of a flat bed without using bedrails?: A Little Help needed moving to and from a bed to a chair (including a wheelchair)?: A Little Help needed standing up from a chair using your arms (e.g., wheelchair or bedside chair)?: A Little Help needed to walk in hospital room?: A Little Help needed climbing 3-5 steps with a railing? : A Little 6 Click Score: 18    End of Session Equipment Utilized During Treatment: Gait belt Activity Tolerance: Patient tolerated treatment well Patient left: in chair;with family/visitor present Nurse Communication: Mobility status PT Visit Diagnosis: Muscle weakness (generalized) (M62.81);Difficulty in walking, not elsewhere classified (R26.2);Unsteadiness on feet (R26.81)    Time: [id number] PT Time Calculation (min) (ACUTE ONLY): 17 min   Charges:   PT Evaluation $PT Eval Moderate Complexity: 1 Mod PT  Treatments $Therapeutic Activity: 8-22 mins        Greggory Stallion, PT, DPT [phone]   Raidyn Wassink 07/08/2019, 4:39 PM

## 2019-07-08 NOTE — Progress Notes (Signed)
Nancy Clark   DOB:July 28, 1960   VQ#:[id number]    Subjective: No acute events overnight.  Patient continues to feel drowsy/easily arousable.  Complains of generalized weakness.  Poor appetite.   Objective:  Vitals:   07/08/19 1500 07/08/19 1538  BP: (!) 153/81 136/82  Pulse: 62 65  Resp: 18 18  Temp:  97.9 F (36.6 C)  SpO2: 99% 100%     Intake/Output Summary (Last 24 hours) at 07/08/2019 1931 Last data filed at 07/08/2019 1804 Gross per 24 hour  Intake 2796.04 ml  Output 930 ml  Net 1866.04 ml    Physical Exam  Constitutional:  Cachectic appearing African-American female patient; accompanied by her mother.  HENT:  Head: Normocephalic and atraumatic.  Mouth/Throat: No oropharyngeal exudate.  Positive for oral ulceration.  Eyes: Pupils are equal, round, and reactive to light.  Cardiovascular: Normal rate and regular rhythm.  Pulmonary/Chest: No respiratory distress. She has no wheezes.  Decreased air entry bilateral bases.  Abdominal: Soft. Bowel sounds are normal. She exhibits no distension and no mass. There is no abdominal tenderness. There is no rebound and no guarding.  Positive for hepatomegaly.  Musculoskeletal:        General: No tenderness or edema. Normal range of motion.     Cervical back: Normal range of motion and neck supple.  Neurological:  Drowsy; oriented x2- 3.  Skin: Skin is warm.  Psychiatric: Affect normal.     Labs:  Lab Results  Component Value Date   WBC 6.2 07/05/2019   HGB 10.5 (L) 07/05/2019   HCT 34.4 (L) 07/05/2019   MCV 81.7 07/05/2019   PLT 81 (L) 07/05/2019   NEUTROABS 5.7 07/05/2019    Lab Results  Component Value Date   NA 146 (H) 07/08/2019   K 3.3 (L) 07/08/2019   CL 111 07/08/2019   CO2 29 07/08/2019    Studies:  No results found.  Cancer of ascending colon metastatic to intra-abdominal lymph node [facility]) #59 year old female patient with metastatic colon cancer status post multiple lines of therapy-that admitted  hospital for fever/worsening abdominal pain; CT scan suggestive of liver abscess/positive blood cultures.   # Colon cancer- cecal/ right-sided; STAGE IV-patient most recently on Opdivo plus Stivarga s/p cycle #1-; February, 15th CT scan and pelvis-8-9 cm liver abscess [see below]; however progressive lesions in the liver/also mesenteric mass-concerning for progressive disease.  See below  #Liver abscess s/p drainage-positive for bacteremia/blood cultures- on IV antibiotics  # Mucositis-grade 3-secondary to [place].  Continue to hold Stivarga.  # hypokalemia-2.2;-continue potassium supplementation  Prognosis/family discussion: Met with patient's mother and husband-and discussed at length regarding patient's clinical status.  Discussed that patient continues to have extremely poor prognosis-given progression to multiple lines of therapy; most recently on Opdivo+ Stivarga s/p cycle #1; and and the fact that CT scanning shows progressive disease; along with liver abscess.  They understand the patient will need long-term IV antibiotics and also understand that the liver abscess may be difficult to heal/resolve given active malignancy/immunosuppression.  Also discussed that patient might never be candidate for further treatments-poor tolerance to therapy/number of weeks needed to treat liver abscess/overall poor tolerance of therapy.  #DNR/DNI-was discussed at length with the patient's husband/mother given patient's waxing and waning mentation [clinically suggestive of metabolic encephalopathy].  Discussed the futility of aggressive interventions like CPR/intubation etc.  No decisions made.  #For now we will continue-current plan of care-IV antibiotics/electrolyte supplementation/nutrition.   # 40 minutes face-to-face with the patient discussing the above plan  of care; more than 50% of time spent on prognosis/ natural history; counseling and coordination.  Discussed with Josh at length.    Cammie Sickle, MD 07/08/2019  7:31 PM

## 2019-07-08 NOTE — Progress Notes (Signed)
[facility] [place] NAME: Nancy Clark    MR#:  [id number]  DATE OF BIRTH:  1961/03/17  SUBJECTIVE:  patient came in with ongoing abdominal pain was found to have liver abscess. Interventional radiology part of drain. No fever. Appears sick, weak and debilated No vomiting Very poor appetite REVIEW OF SYSTEMS:   Review of Systems  Constitutional: Positive for malaise/fatigue and weight loss. Negative for chills and fever.  HENT: Negative for ear discharge, ear pain and nosebleeds.   Eyes: Negative for blurred vision, pain and discharge.  Respiratory: Negative for sputum production, shortness of breath, wheezing and stridor.   Cardiovascular: Negative for chest pain, palpitations, orthopnea and PND.  Gastrointestinal: Positive for abdominal pain. Negative for diarrhea, nausea and vomiting.  Genitourinary: Negative for frequency and urgency.  Musculoskeletal: Negative for back pain and joint pain.  Neurological: Positive for weakness. Negative for sensory change, speech change and focal weakness.  Psychiatric/Behavioral: Negative for depression and hallucinations. The patient is not nervous/anxious.    Tolerating Diet:poor Tolerating PT: pending  DRUG ALLERGIES:  No Known Allergies  VITALS:  Blood pressure (!) 157/66, pulse 64, temperature 98.6 F (37 C), temperature source Oral, resp. rate (!) 24, height 5\' 4"  (1.626 m), weight 35.5 kg, SpO2 100 %.  PHYSICAL EXAMINATION:   Physical Exam  GENERAL:  59 y.o.-year-old patient lying in the bed with no acute distress. Thin cachectic debilitated chronically  ill appearing EYES: Pupils equal, round, reactive to light and accommodation. No scleral icterus.  Pallor+ HEENT: Head atraumatic, normocephalic. Oropharynx and nasopharynx clear.  NECK:  Supple, no jugular venous distention. No thyroid enlargement, no tenderness.  LUNGS: Normal breath sounds bilaterally, no wheezing, rales,  rhonchi. No use of accessory muscles of respiration. PORT + CARDIOVASCULAR: S1, S2 normal. No murmurs, rubs, or gallops.  ABDOMEN: Soft, +tender right Upper quadrant, nondistended. Bowel sounds present. No organomegaly or mass.  EXTREMITIES: No cyanosis, clubbing or edema b/l.    NEUROLOGIC: Cranial nerves II through XII are intact. No focal Motor or sensory deficits b/l.very weak and deconditioned   PSYCHIATRIC:  patient is alert and oriented x 3.  SKIN: No obvious rash, lesion, or ulcer.   LABORATORY PANEL:  CBC Recent Labs  Lab 07/05/19 2034  WBC 6.2  HGB 10.5*  HCT 34.4*  PLT 81*    Chemistries  Recent Labs  Lab 07/07/19 0356 07/07/19 1804 07/08/19 0321  NA 152*  --  146*  K 2.4*   < > 3.3*  CL 116*  --  111  CO2 28  --  29  GLUCOSE 110*  --  246*  BUN 27*  --  20  CREATININE 0.88  --  0.73  CALCIUM 7.4*  --  8.1*  MG 2.2  --  1.9  AST 36  --   --   ALT 33  --   --   ALKPHOS 156*  --   --   BILITOT 1.2  --   --    < > = values in this interval not displayed.   Cardiac Enzymes No results for input(s): TROPONINI in the last 168 hours. RADIOLOGY:  CT IMAGE GUIDED DRAINAGE BY PERCUTANEOUS CATHETER  Result Date: 07/06/2019 CLINICAL DATA:  Hepatic abscess and history of metastatic colon carcinoma. EXAM: CT GUIDED CATHETER DRAINAGE OF HEPATIC ABSCESS ANESTHESIA/SEDATION: 1.0 mg IV Versed 50 mcg IV Fentanyl Total Moderate Sedation Time:  19 minutes The patient's level of consciousness and physiologic status  were continuously monitored during the procedure by Radiology nursing. PROCEDURE: The procedure, risks, benefits, and alternatives were explained to the patient. Questions regarding the procedure were encouraged and answered. The patient understands and consents to the procedure. A time out was performed prior to initiating the procedure. The right abdominal wall was prepped with chlorhexidine in a sterile fashion, and a sterile drape was applied covering the operative  field. A sterile gown and sterile gloves were used for the procedure. Local anesthesia was provided with 1% Lidocaine. CT was performed through the upper abdomen in a supine position. Under CT guidance, an 18 gauge trocar needle was advanced into a right lobe hepatic abscess. Aspiration of fluid was performed. A guidewire was advanced into the collection and the needle removed. The tract was dilated and a 10 French percutaneous drainage catheter placed. Drainage catheter position was confirmed by CT. The drain was aspirated for additional fluid sample. The drainage catheter was then flushed with saline and connected to a suction bulb. The catheter was secured at the skin with a Prolene retention suture and StatLock device. COMPLICATIONS: None FINDINGS: The large right lobe hepatic abscess cavity yielded grossly purulent fluid. After placement of the 10 French drain, there is good return of brownish colored purulent fluid. IMPRESSION: CT-guided percutaneous catheter drainage of right lobe hepatic abscess yielding purulent fluid. A fluid sample was sent for culture analysis. A 10 French drainage catheter was placed and attached to suction bulb drainage. Electronically Signed   By: Aletta Edouard M.D.   On: 07/06/2019 15:14   ASSESSMENT AND PLAN:  Nancy Clark is a 59 y.o. female with medical history significant of hypertension, depression, metastasized colon cancer on chemotherapy, peripheral neuropathy due to chemotherapy, thrombocytopenia, GI bleeding, biliary obstruction, who presents with abdominal pain and fever. Patient states that she has been worsening abdominal pain in the past several days, which is located in the central abdomen, constant, moderate, sharp, nonradiating.  Sepsis due to Large liver abscess and Enterococcus bacteremia: - Patient meets criteria sepsis with fever, tachycardia, elevated lactic acid up to 8.8, which is normalized with IV fluid currently.  -Blood culture is 4 of 4  positive: 2 of 4 GPC and 4 of 4 GNR. BCID: Klebsiella oxytoca, Enterobacter cloacae complex, and Enterococcus species. -Fluid culture Enterococcus fecalis and cloacae -Dr. Kathlene Cote of IR was consulted for drainage -->s/p of drainage -IV zosyn and Vancomycin  -Follow-up of blood culture enterococcus fecalis and cloacae -ID consult appreciated for abx  Cancer of ascending colon metastatic to intra-abdominal lymph node, liver [facility]):  s/p of chemotherapy. -Patient has very poor performance status. Spoke with Dr. Rogue Bussing and Merrily Pew borders nurse practitioner plan to meet patient's mother and husband to discuss current medical condition  Hypernatremia /Hypokalemia: K= 2.8  on admission. -Pharmacy consult placed -sodium 152-->146 -K and phosphorus being repleted  Thrombocytopenia [facility]): Likely due to chemotherapy.  No active bleeding  -f/u by CBC  Normocytic anemia: Hemoglobin stable.  10.5  Essential hypertension: - cont clonidine patches and amlodipine  Nutrition Status: Nutrition Problem: Severe Malnutrition Etiology: chronic illness(stage IV colon cancer) Signs/Symptoms: percent weight loss, severe fat depletion, severe muscle depletion Percent weight loss: 20.6 % Interventions: Refer to RD note for recommendations  Pt at risk for re-feeding syndrome  Overall poor prognosis.  Appreciate Palliative care input  Procedures: IR drained liver abcess Family communication : pt's mother has been updated by Merrily Pew, NP palliative care Consults :ID, oncology Discharge Disposition :TBD CODE STATUS: Full code DVT Prophylaxis :  lovneonx Barriers to discharge:none. Ongoing rx for sepsis and liver abscess  TOTAL TIME TAKING CARE OF THIS PATIENT: *35* minutes.  >50% time spent on counselling and coordination of care  Note: This dictation was prepared with Dragon dictation along with smaller phrase technology. Any transcriptional errors that result from this process are  unintentional.  Fritzi Mandes M.D    [facility]   CC: Primary care physician; Patient, No Pcp PerPatient ID: Nancy Clark, female   DOB: 05/06/1961, 59 y.o.   MRN: [id number]

## 2019-07-08 NOTE — Progress Notes (Signed)
ID Pt letahrgic and sleeping Mom at bed side and she thinks due to morphine  Patient Vitals for the past 24 hrs:  BP Temp Temp src Pulse Resp SpO2  07/08/19 1538 136/82 97.9 F (36.6 C) Oral 65 18 100 %  07/08/19 1500 (!) 153/81 -- -- 62 18 99 %  07/08/19 1400 (!) 151/70 -- -- 61 19 99 %  07/08/19 1300 (!) 148/74 -- -- 64 20 100 %  07/08/19 1200 (!) 157/66 -- -- 64 (!) 24 100 %  07/08/19 1100 (!) 152/74 -- -- (!) 58 16 100 %  07/08/19 1000 132/87 -- -- 75 16 100 %  07/08/19 0900 (!) 149/73 -- -- (!) 53 15 100 %  07/08/19 0800 (!) 146/78 98.6 F (37 C) Oral (!) 53 (!) 23 100 %  07/08/19 0700 (!) 160/80 -- -- (!) 51 15 100 %  07/08/19 0200 (!) 111/92 98.5 F (36.9 C) Oral -- 16 98 %  07/07/19 2000 (!) 146/67 98.6 F (37 C) Oral (!) 50 16 100 %    O/E lethargic Sleepy Rt uQ drain -bilious fluid CBC Latest Ref Rng & Units 07/05/2019 07/02/2019 06/25/2019  WBC 4.0 - 10.5 K/uL 6.2 20.5(H) 11.2(H)  Hemoglobin 12.0 - 15.0 g/dL 10.5(L) 9.8(L) 9.6(L)  Hematocrit 36.0 - 46.0 % 34.4(L) 30.9(L) 31.5(L)  Platelets 150 - 400 K/uL 81(L) 191 108(L)     CMP Latest Ref Rng & Units 07/08/2019 07/07/2019 07/07/2019  Glucose 70 - 99 mg/dL 246(H) - 110(H)  BUN 6 - 20 mg/dL 20 - 27(H)  Creatinine 0.44 - 1.00 mg/dL 0.73 - 0.88  Sodium 135 - 145 mmol/L 146(H) - 152(H)  Potassium 3.5 - 5.1 mmol/L 3.3(L) 3.1(L) 2.4(LL)  Chloride 98 - 111 mmol/L 111 - 116(H)  CO2 22 - 32 mmol/L 29 - 28  Calcium 8.9 - 10.3 mg/dL 8.1(L) - 7.4(L)  Total Protein 6.5 - 8.1 g/dL - - 4.6(L)  Total Bilirubin 0.3 - 1.2 mg/dL - - 1.2  Alkaline Phos 38 - 126 U/L - - 156(H)  AST 15 - 41 U/L - - 36  ALT 0 - 44 U/L - - 33     Impression/Recommendation ? ?Polymicrobial bacteremia- ( enterococcus, klebsiella, enterobacter cloacae)  likely source lCBD/liver abscess Less likely PORT Pt has CBD stent placed in Dec 2020 ? MRI ? Need for stent exchange  Currently on zosyn- will need Iv antibiotics 2-4 weeks  Metastatic colon  carcinoma- followed by Dr.Brahmandy- last checkpoint inhibitor therapy in Jan 2021  Liver mets, CBD obstruction- s/p stent  Vertebral met- s/p kyphoplasty   Anemia  Hypokalemia- being corrected  Thrombocytopenia- could be due to infection  Poor prognosis Discussed with her mother

## 2019-07-08 NOTE — Progress Notes (Addendum)
[place] for Electrolyte Monitoring and Replacement   Recent Labs: Potassium (mmol/L)  Date Value  07/08/2019 3.3 (L)   Magnesium (mg/dL)  Date Value  07/08/2019 1.9   Calcium (mg/dL)  Date Value  07/08/2019 8.1 (L)   Albumin (g/dL)  Date Value  07/07/2019 1.6 (L)   Phosphorus (mg/dL)  Date Value  07/08/2019 1.0 (LL)   Sodium (mmol/L)  Date Value  07/08/2019 146 (H)     Assessment: 59 year old female admitted with weakness and abdominal pain. Patient with positive blood cultures and hepatic abscess s/p drain placement. Patient with electrolyte abnormalities on presentation. Patient at refeeding risk. Pharmacy consulted to manage.  Goal of Therapy:  Electrolytes WNL  Plan: . P 1.0 Pt is receiving potassium phosphate 20 mmol in dextrose 5% for 1 dose. This will also give 30 meq of K. Likely will need additional phosphorous replacement. Will continue to monitor electrolytes closely as patient is at refeeding risk.   Pt is hyperglycemic with BG 246, this is likely due to the D5. She was slightly hypoglycemic yesterday (BG 108).  Will continue to monitor BG.   Nancy Clark,  Pharmacy Student 07/08/2019 11:52 AM

## 2019-07-09 ENCOUNTER — Inpatient Hospital Stay: Payer: BC Managed Care – PPO | Admitting: Oncology

## 2019-07-09 ENCOUNTER — Inpatient Hospital Stay: Payer: BC Managed Care – PPO

## 2019-07-09 DIAGNOSIS — A419 Sepsis, unspecified organism: Secondary | ICD-10-CM

## 2019-07-09 LAB — BASIC METABOLIC PANEL
Anion gap: 9 (ref 5–15)
BUN: 12 mg/dL (ref 6–20)
CO2: 27 mmol/L (ref 22–32)
Calcium: 7.3 mg/dL — ABNORMAL LOW (ref 8.9–10.3)
Chloride: 101 mmol/L (ref 98–111)
Creatinine, Ser: 0.67 mg/dL (ref 0.44–1.00)
GFR calc Af Amer: >60 mL/min (ref >60)
GFR calc non Af Amer: >60 mL/min (ref >60)
Glucose, Bld: 95 mg/dL (ref 70–99)
Potassium: 3.4 mmol/L — ABNORMAL LOW (ref 3.5–5.1)
Sodium: 137 mmol/L (ref 135–145)

## 2019-07-09 LAB — HEPATIC FUNCTION PANEL
ALT: 21 U/L (ref 0–44)
AST: 15 U/L (ref 15–41)
Albumin: 1.6 g/dL — ABNORMAL LOW (ref 3.5–5.0)
Alkaline Phosphatase: 150 U/L — ABNORMAL HIGH (ref 38–126)
Bilirubin, Direct: 0.4 mg/dL — ABNORMAL HIGH (ref 0.0–0.2)
Indirect Bilirubin: 0.9 mg/dL (ref 0.3–0.9)
Total Bilirubin: 1.3 mg/dL — ABNORMAL HIGH (ref 0.3–1.2)
Total Protein: 4.4 g/dL — ABNORMAL LOW (ref 6.5–8.1)

## 2019-07-09 LAB — CULTURE, BLOOD (ROUTINE X 2)
Special Requests: ADEQUATE
Special Requests: ADEQUATE

## 2019-07-09 LAB — MAGNESIUM: Magnesium: 1.4 mg/dL — ABNORMAL LOW (ref 1.7–2.4)

## 2019-07-09 LAB — GLUCOSE, CAPILLARY: Glucose-Capillary: 78 mg/dL (ref 70–99)

## 2019-07-09 LAB — PHOSPHORUS: Phosphorus: 2.2 mg/dL — ABNORMAL LOW (ref 2.5–4.6)

## 2019-07-09 MED ORDER — MAGNESIUM SULFATE 2 GM/50ML IV SOLN
2.0000 g | Freq: Once | INTRAVENOUS | Status: AC
  Administered 2019-07-09: 2 g via INTRAVENOUS
  Filled 2019-07-09 (×2): qty 50

## 2019-07-09 MED ORDER — MAGNESIUM SULFATE 2 GM/50ML IV SOLN
2.0000 g | Freq: Once | INTRAVENOUS | Status: AC
  Administered 2019-07-09: 2 g via INTRAVENOUS
  Filled 2019-07-09: qty 50

## 2019-07-09 MED ORDER — POTASSIUM PHOSPHATES 15 MMOLE/5ML IV SOLN
10.0000 mmol | Freq: Once | INTRAVENOUS | Status: AC
  Administered 2019-07-09: 10 mmol via INTRAVENOUS
  Filled 2019-07-09: qty 3.33

## 2019-07-09 MED ORDER — VANCOMYCIN HCL 500 MG/100ML IV SOLN
500.0000 mg | INTRAVENOUS | Status: DC
  Filled 2019-07-09: qty 100

## 2019-07-09 MED ORDER — MORPHINE SULFATE (PF) 2 MG/ML IV SOLN
1.0000 mg | Freq: Three times a day (TID) | INTRAVENOUS | Status: DC | PRN
  Administered 2019-07-12: 1 mg via INTRAVENOUS
  Filled 2019-07-09: qty 1

## 2019-07-09 MED ORDER — VANCOMYCIN HCL 750 MG/150ML IV SOLN
750.0000 mg | Freq: Once | INTRAVENOUS | Status: AC
  Administered 2019-07-09: 750 mg via INTRAVENOUS
  Filled 2019-07-09: qty 150

## 2019-07-09 MED ORDER — VANCOMYCIN HCL 750 MG/150ML IV SOLN: 750.0000 mg | INTRAVENOUS | Status: DC

## 2019-07-09 NOTE — Progress Notes (Signed)
[place] for Electrolyte Monitoring and Replacement   Recent Labs: Potassium (mmol/L)  Date Value  07/09/2019 3.4 (L)   Magnesium (mg/dL)  Date Value  07/09/2019 1.4 (L)   Calcium (mg/dL)  Date Value  07/09/2019 7.3 (L)   Albumin (g/dL)  Date Value  07/07/2019 1.6 (L)   Phosphorus (mg/dL)  Date Value  07/09/2019 2.2 (L)   Sodium (mmol/L)  Date Value  07/09/2019 137   Corrected Ca: 9.2 mg/dL  Assessment: 59 year old female admitted with weakness and abdominal pain. Patient with positive blood cultures and hepatic abscess s/p drain placement. Patient with electrolyte abnormalities on presentation. Patient at refeeding risk. Pharmacy consulted to manage electrolytes. Over the previous 24 hours the patient received approximately 60 mEq IV potassium and 20 mmol IV phosphorous. Fluids were changed yesterday from D520 at 100 mL/hr to 0.45%NS w. 20 mEq KCl/L at 75 mL/hr  Goal of Therapy:  Electrolytes WNL  Plan: .  Replace magnesium with 2 grams IV magnesium sulfate  potassium phosphate 10 mmol IV x1 (this will provide 14.5 meq potassium)   Continue 0.45%NS w. 20 mEq KCl/L at 75 mL/hr  Next electrolytes in am   Dallie Piles,  07/09/2019 7:15 AM

## 2019-07-09 NOTE — Consult Note (Signed)
Pharmacy Antibiotic Note  Nancy Clark is a 59 y.o. female admitted on 07/05/2019 with polymicrobial bacteremia.  Pharmacy has been consulted for vancomycin and Zosyn dosing. Prior to today her cultures had grown out organisms able to be treated with Zosyn effectively  Plan: 1) continue Zosyn 3.375g IV q8h (4 hour infusion)  2)  Start vancomycin 750 mg IV x 1 followed by vancomycin 750 mg IV Q 36 hrs  Goal AUC 400-550 Expected AUC: 484 SCr used: 0.80 (rounded)  Height: 5\' 4"  (162.6 cm) Weight: 78 lb 4.2 oz (35.5 kg) IBW/kg (Calculated) : 54.7  Temp (24hrs), Avg:98.4 F (36.9 C), Min:97.9 F (36.6 C), Max:99.3 F (37.4 C)  Recent Labs  Lab 07/05/19 2034 07/05/19 2248 07/06/19 0220 07/06/19 0620 07/06/19 1702 07/07/19 0356 07/08/19 0321 07/09/19 0525  WBC 6.2  --   --   --   --   --   --   --   CREATININE 1.06*  --   --   --   --  0.88 0.73 0.67  LATICACIDVEN 8.9* 8.8* 2.7* 1.8 1.1  --   --   --     Estimated Creatinine Clearance: 44 mL/min (by C-G formula based on SCr of 0.67 mg/dL).    No Known Allergies  Antimicrobials this admission: 2/13 cefepime x1 2/13 metronidazole >> 2/14 Zosyn 2/14 >>  Vancomycin 2/14 >> 2/15  2/17>>  Microbiology results: 2/17 Bcx: NGTD 2/14 MRSA PCR: negative 2/13 Bcx: Enterobacter clocae, E faecalis, K oxytoca 2/14 liver culture: E clocae 2/14 Abscess: E cloacae, S anginosis  Thank you for allowing pharmacy to be a part of this patient's care.  Dallie Piles 07/09/2019 2:38 PM

## 2019-07-09 NOTE — Treatment Plan (Signed)
Diagnosis: Polymicrobial bacteremia and polymicrobial liver abscess  Baseline Creatinine <1   Culture Result: ( enterobacter cloacae, enterococcus, klebsiella) No Known Allergies  OPAT Orders Discharge antibiotics: Zosyn 3.375 grams IV every 6 hours - can be given as a 24 hour infusion. Duration: 4 weeks End Date: 08/02/19  PORT Care Per Protocol:  Labs weekly while on IV antibiotics: _X_ CBC with differential  _X_ CMP    Please leave PORT  in place   Fax weekly labs to [facility] [phone]  Clinic Follow Up Appt with Oncology  Call [phone] with any questions

## 2019-07-09 NOTE — Progress Notes (Signed)
[place]  [phone] [phone]   Name: Nancy Clark Date: 07/09/2019 MRN: [id number]  DOB: 1960-11-12  Patient Care Team: Patient, No Pcp Per as PCP - General (General Practice)    REASON FOR CONSULTATION: Nancy Clark a 59 y.o.femalewith multiple medical problems including stage IV colorectal cancer status post right hemicolectomy and XRT. She is also status post liver ablation to area of hepatic metastasis. Patient isstatus post5-FU plus bevacizumaband is now on treatment with nivolumab. Recent CT scan on 05/02/2019 revealed new biliary obstruction at the distal CBD due to compressive malignancy with biliary obstruction of the left and right hepatic lobes with 3 new hepatic lesions. Patient underwent biliary stenting on 05/05/2019. Patient has had rising CEA levels. Opdivo has been held due to fevers.   Patient was admitted to the hospital on 07/06/2019 with fever and abdominal pain.  CT of abdomen/pelvis revealed an 8.5 cm abscess in the posterior right hepatic lobe.  Patient was also found to have progressive hepatic lesions with abnormal soft tissue in the porta hepatis concerning for tumor.  Patient is status post CT-guided drain of her hepatic abscess.  She was referred to palliative care to help discuss goals and manage ongoing symptoms.   CODE STATUS: Full code  PAST MEDICAL HISTORY: Past Medical History:  Diagnosis Date   Chicken pox    Colon cancer ([place])    Partial colectomy 07/2015 + chemo tx's   Hypertension    Hypokalemia    Menopause    > 5 yrs   Peripheral neuropathy due to chemotherapy ([place])    bi lat feet    PAST SURGICAL HISTORY:  Past Surgical History:  Procedure Laterality Date   BILIARY STENT PLACEMENT  05/05/2019   Procedure: BILIARY STENT PLACEMENT;  Surgeon: Ronnette Juniper, MD;  Location: [place];  Service: Gastroenterology;;   BREAST BIOPSY Right    2007? pt unsure done by [facility]   COLONOSCOPY     COLONOSCOPY WITH PROPOFOL N/A 10/14/2015   Procedure: COLONOSCOPY WITH PROPOFOL;  Surgeon: Hulen Luster, MD;  Location: [facility] ENDOSCOPY;  Service: Endoscopy;  Laterality: N/A;   COLOSTOMY REVISION Right 08/09/2015   Procedure: COLON RESECTION RIGHT;  Surgeon: Florene Glen, MD;  Location: [facility] ORS;  Service: General;  Laterality: Right;   ECTOPIC PREGNANCY SURGERY     ERCP N/A 05/05/2019   Procedure: ENDOSCOPIC RETROGRADE CHOLANGIOPANCREATOGRAPHY (ERCP);  Surgeon: Ronnette Juniper, MD;  Location: [place];  Service: Gastroenterology;  Laterality: N/A;   IR BONE TUMOR(S)RF ABLATION  05/24/2018   IR KYPHO LUMBAR INC FX REDUCE BONE BX UNI/BIL CANNULATION INC/IMAGING  05/24/2018   IR RADIOLOGIST EVAL & MGMT  05/08/2018   IR RADIOLOGIST EVAL & MGMT  06/18/2018   IR RADIOLOGIST EVAL & MGMT  07/31/2018   IR RADIOLOGIST EVAL & MGMT  12/19/2018   IR RADIOLOGIST EVAL & MGMT  02/27/2019   PANCREATIC STENT PLACEMENT  05/05/2019   Procedure: PANCREATIC STENT PLACEMENT;  Surgeon: Ronnette Juniper, MD;  Location: [facility] ENDOSCOPY;  Service: Gastroenterology;;   PORTACATH PLACEMENT N/A 09/01/2015   Procedure: INSERTION PORT-A-CATH;  Surgeon: Florene Glen, MD;  Location: [facility] ORS;  Service: General;  Laterality: N/A;   RADIOLOGY WITH ANESTHESIA N/A 11/06/2018   Procedure: CT-MICROWAVE THERMAL ABLATION/LIVER;  Surgeon: Corrie Mckusick, DO;  Location: [facility] ORS;  Service: Anesthesiology;  Laterality: N/A;   REMOVAL OF STONES  05/05/2019   Procedure: REMOVAL OF STONES;  Surgeon: Ronnette Juniper, MD;  Location: [place] ENDOSCOPY;  Service: Gastroenterology;;   SHOULDER SURGERY Left    SPHINCTEROTOMY  05/05/2019   Procedure: SPHINCTEROTOMY;  Surgeon: Ronnette Juniper, MD;  Location: [facility] ENDOSCOPY;  Service: Gastroenterology;;    HEMATOLOGY/ONCOLOGY HISTORY:  Oncology History Overview Note  # FEB-MARCH 2017- COLON CANCER- STAGE IV [s/p right hemi-colectomy; pT4apN2 (7/19LN)]; Pre-op  CEA- [id number]  PET-  mesenteric adenopathy/mediastinal adenopathy/right-sided subclavicular lymph node.    April 17th -START FOLFOX; May 1st Add Avastin;   Aug 16th- Disc OX [sec PN]; con 5FU-Avastin; NOV 27th CT C/A/P- CR; except for 46m LUL; 5FU-Avastin q 3 W [Poor tolerance to FOLFOX].  # June/July 2019- Progression of Liver/Abd LN; July 2019- START FOLFIRI + Avastin; STOP iri 02/25/2018- sec to diarrhea; cont Avastin + 5FU  # March 2018- L2 uptake MET s/p RT [[facility]2018]; mid April X-geva  # L1 ablation/ostecool-  Jan 3rd 2020-   # march 2nd 2020- decrease 5FU CIV  to 20024mm2/  #June 17th, 2020-liver ablation; 3.5 cm lesion.  Intolerance to 5-FU plus Avastin.  Discontinued; Side effects/weight loss/ quality of life  #July 13th -Lonsurf cycle #1; x2 cycles; September 2020-progressive disease in the mesenteric region; liver disease stable;  #March 05, 2019-5-FU plus Avastin [peripheral neuropathy/diarrhea]; DEC 2020- Progression,  # JAN 2021- OPDIVO + REGO  # July 2nd CT scan-right laryngeal nerve palsy/hoarseness of voice [Dr. McQueen] CT-chest NEG; MRI brain negative.  # MOLECULAR TESTING: K-ras-EXON 2- MUTATED; MSI- STABLE.   # palliative care- Josh Remer Couse.   DIAGNOSIS: colon ca  STAGE:  IV ;GOALS: pallaitive  CURRENT/MOST RECENT THERAPY: Opdivo [C]+ Regorefenib.     Cancer of ascending colon metastatic to intra-abdominal lymph node ([facility] 05/28/2019 -  Chemotherapy   The patient had nivolumab (OPDIVO) 240 mg in sodium chloride 0.9 % 100 mL chemo infusion, 240 mg, Intravenous, Once, 2 of 6 cycles Administration: 240 mg (05/28/2019), 240 mg (06/11/2019)  for chemotherapy treatment.      ALLERGIES:  has No Known Allergies.  MEDICATIONS:  Current Facility-Administered Medications  Medication Dose Route Frequency Provider Last Rate Last Admin   0.45 % NaCl with KCl 20 mEq / L infusion   Intravenous Continuous PaFritzi MandesMD 75 mL/hr at 07/09/19 1734 Rate Verify at  07/09/19 1734   acetaminophen (TYLENOL) tablet 650 mg  650 mg Oral Q6H PRN PaFritzi MandesMD       amLODipine (NORVASC) tablet 10 mg  10 mg Oral Clark PaFritzi MandesMD   10 mg at 07/09/19 1150   calcium-vitamin D (OSCAL WITH D) 500-200 MG-UNIT per tablet 1 tablet  1 tablet Oral q1800 SiCharlett NoseRPH   1 tablet at 07/07/19 1706   Chlorhexidine Gluconate Cloth 2 % PADS 6 each  6 each Topical Clark NiIvor CostaMD   6 each at 07/07/19 1011   cloNIDine (CATAPRES - Dosed in mg/24 hr) patch 0.1 mg  0.1 mg Transdermal Q Mon-1800 PaFritzi MandesMD   0.1 mg at 07/07/19 1707   diphenoxylate-atropine (LOMOTIL) 2.5-0.025 MG per tablet 1 tablet  1 tablet Oral QID PRN NiIvor CostaMD       DULoxetine (CYMBALTA) DR capsule 40 mg  40 mg Oral Clark NiIvor CostaMD   40 mg at 07/09/19 1150   enoxaparin (LOVENOX) injection 30 mg  30 mg Subcutaneous Q24H PaFritzi MandesMD       feeding supplement (BOOST / RESOURCE BREEZE) liquid 1 Container  1 Container Oral TID BM PaFritzi MandesMD   1  Container at 07/09/19 1630   gabapentin (NEURONTIN) capsule 300 mg  300 mg Oral Clark Charlett Nose, RPH   300 mg at 07/09/19 1150   gabapentin (NEURONTIN) capsule 600 mg  600 mg Oral QHS Charlett Nose, RPH   600 mg at 07/07/19 2141   ibuprofen (ADVIL) tablet 200 mg  200 mg Oral Q6H PRN Ivor Costa, MD       magic mouthwash  10 mL Oral QID Fritzi Mandes, MD   10 mL at 07/09/19 1442   And   lidocaine (XYLOCAINE) 2 % viscous mouth solution 10 mL  10 mL Mouth/Throat QID Fritzi Mandes, MD   10 mL at 07/09/19 1435   loperamide (IMODIUM) capsule 2 mg  2 mg Oral QID PRN Ivor Costa, MD       magnesium sulfate IVPB 2 g 50 mL  2 g Intravenous Once Max Sane, MD 50 mL/hr at 07/09/19 1811 2 g at 07/09/19 1811   menthol-cetylpyridinium (CEPACOL) lozenge 3 mg  1 lozenge Oral PRN Fritzi Mandes, MD       morphine 2 MG/ML injection 1 mg  1 mg Intravenous Q8H PRN Max Sane, MD       multivitamin with minerals tablet 1 tablet  1  tablet Oral Clark Ivor Costa, MD   1 tablet at 07/09/19 1149   ondansetron (ZOFRAN) injection 4 mg  4 mg Intravenous Q8H PRN Ivor Costa, MD   4 mg at 07/09/19 6503   oxyCODONE (Oxy IR/ROXICODONE) immediate release tablet 5 mg  5 mg Oral Q6H PRN Ivor Costa, MD   5 mg at 07/09/19 1156   pantoprazole (PROTONIX) EC tablet 40 mg  40 mg Oral Q1200 Ivor Costa, MD   40 mg at 07/09/19 1435   piperacillin-tazobactam (ZOSYN) IVPB 3.375 g  3.375 g Intravenous Q8H Hallaji, Sheema M, RPH 12.5 mL/hr at 07/09/19 1734 Rate Verify at 07/09/19 1734   sodium chloride flush (NS) 0.9 % injection 5 mL  5 mL Intracatheter Q8H Aletta Edouard, MD   5 mL at 07/09/19 1400   [START ON 07/10/2019] vancomycin (VANCOREADY) IVPB 500 mg/100 mL  500 mg Intravenous Q24H Berton Mount, RPH       Facility-Administered Medications Ordered in Other Encounters  Medication Dose Route Frequency Provider Last Rate Last Admin   sodium chloride flush (NS) 0.9 % injection 10 mL  10 mL Intravenous PRN Cammie Sickle, MD   10 mL at 10/04/15 0901   sodium chloride flush (NS) 0.9 % injection 10 mL  10 mL Intravenous PRN Cammie Sickle, MD   10 mL at 07/02/17 1020    VITAL SIGNS: BP 122/70    Pulse (!) 55    Temp 99.3 F (37.4 C) (Oral)    Resp 14    Ht _0  (1.626 m)    Wt 78 lb 4.2 oz (35.5 kg)    LMP  (LMP Unknown) Comment: LMP MORE THAN 5 YRS   SpO2 100%    BMI 13.43 kg/m  Filed Weights   07/05/19 2007 07/06/19 1522  Weight: 74 lb 8.3 oz (33.8 kg) 78 lb 4.2 oz (35.5 kg)    Estimated body mass index is 13.43 kg/m as calculated from the following:   Height as of this encounter: _1  (1.626 m).   Weight as of this encounter: 78 lb 4.2 oz (35.5 kg).  LABS: CBC:    Component Value Date/Time   WBC 6.2 07/05/2019 2034   HGB 10.5 (L) 07/05/2019  2034   HCT 34.4 (L) 07/05/2019 2034   PLT 81 (L) 07/05/2019 2034   MCV 81.7 07/05/2019 2034   NEUTROABS 5.7 07/05/2019 2034   LYMPHSABS 0.2 (L) 07/05/2019 2034    MONOABS 0.1 07/05/2019 2034   EOSABS 0.1 07/05/2019 2034   BASOSABS 0.1 07/05/2019 2034   Comprehensive Metabolic Panel:    Component Value Date/Time   NA 137 07/09/2019 0525   K 3.4 (L) 07/09/2019 0525   CL 101 07/09/2019 0525   CO2 27 07/09/2019 0525   BUN 12 07/09/2019 0525   CREATININE 0.67 07/09/2019 0525   GLUCOSE 95 07/09/2019 0525   CALCIUM 7.3 (L) 07/09/2019 0525   AST 15 07/09/2019 0525   ALT 21 07/09/2019 0525   ALKPHOS 150 (H) 07/09/2019 0525   BILITOT 1.3 (H) 07/09/2019 0525   PROT 4.4 (L) 07/09/2019 0525   ALBUMIN 1.6 (L) 07/09/2019 0525    RADIOGRAPHIC STUDIES: CT ABDOMEN PELVIS W CONTRAST  Result Date: 07/05/2019 CLINICAL DATA:  Abdominal pain, fever, decreased appetite/PO intake. History of colon cancer. EXAM: CT ABDOMEN AND PELVIS WITH CONTRAST TECHNIQUE: Multidetector CT imaging of the abdomen and pelvis was performed using the standard protocol following bolus administration of intravenous contrast. CONTRAST:  65m OMNIPAQUE IOHEXOL 300 MG/ML  SOLN COMPARISON:  CT abdomen/pelvis dated 05/02/2019 and 02/24/2019. FINDINGS: Lower chest: Lung bases are clear. Hepatobiliary: 6.9 x 6.5 x 8.5 cm abscess with intraluminal gas in the posterior right hepatic lobe (series 2/image 9). This likely transgresses right anterior liver capsule (series 2/image 21; sagittal image 15). This region of the liver includes the prior ablation zone. Given intraluminal gas and the presence of an indwelling common duct stent, communication between the abscess in the biliary tree is suspected. At least one discrete 2.1 cm lesion is present in segment 4 B (series 2/image 23), previously 1.3 cm, favoring a metastasis. An additional 2.6 x 1.0 cm lesion anterior to the abscess is suspected in segment 5 (series 2/image 15), progressive from the prior, but difficult to definitively distinguish from the abscess. Mild pneumobilia in the left hepatic lobe. Mild periportal edema. Intraluminal soft tissue  density within the mid/distal common duct stent (coronal image 22), nonspecific. Pancreas: Dilated pancreatic duct, measuring up to 7 mm in the proximal pancreatic tail (series 2/image 25), new. No definite pancreatic atrophy. Spleen: Within normal limits. Adrenals/Urinary Tract: Adrenal glands are within normal limits. Kidneys are within normal limits.  No hydronephrosis. Thick-walled bladder, although underdistended. Stomach/Bowel: Stomach is within normal limits. No evidence of bowel obstruction. Status post right hemicolectomy with suture line in the right mid abdomen (series 2/image 38). Colon is decompressed. Vascular/Lymphatic: No evidence of abdominal aortic aneurysm. Atherosclerotic calcifications of the abdominal aorta and branch vessels. Abnormal soft tissue in the porta hepatis along the celiac axis (series 2/image 20), new/progressive from the prior, raising concern for tumor. This appearance is less typical of colon cancer and raises concern for hepatobiliary tumor such as pancreatic cancer or cholangiocarcinoma. Otherwise, no suspicious abdominopelvic lymphadenopathy. Reproductive: Uterus is within normal limits. Bilateral ovaries are unremarkable. Other: Small volume abdominopelvic ascites, including perihepatic fluid which directly communicates with the abscess, as described above. Musculoskeletal: Sclerosis involving the L2 vertebral body with prior vertebral augmentation. IMPRESSION: 8.5 cm abscess in the posterior right hepatic lobe, as described above. Abscess directly transgresses the anterior right hepatic capsule with associated perihepatic fluid and small volume abdominopelvic ascites. Communication between the abscess and biliary tree is suspected. Two suspected hepatic lesions which are progressive from the  prior and favor metastases in this patient status post right hemicolectomy for colon cancer. Abnormal soft tissue in the porta hepatis along the celiac axis, new/progressive from the  prior, raising concern for tumor. This appearance is less typical of colon cancer and raises concern for hepatobiliary tumor such as pancreatic cancer or cholangiocarcinoma. Associated new dilatation of the pancreatic duct. Associated intraluminal soft tissue in the mid/distal common duct stent, nonspecific. Electronically Signed   By: Julian Hy M.D.   On: 07/05/2019 22:14   DG Chest Port 1 View  Result Date: 07/05/2019 CLINICAL DATA:  Sepsis, 2 days of weakness and decreased appetite with poor p.o. intake EXAM: PORTABLE CHEST 1 VIEW COMPARISON:  CT chest 11/21/2018 FINDINGS: There is a small patchy opacity in the right lung base which could reflect early infection versus confluence vessels and airways. No other airspace opacity is seen. No pneumothorax or effusion. Accessed left subclavian approach Port-A-Cath tip terminates in the lower SVC. The aorta is calcified. The remaining cardiomediastinal contours are unremarkable. No acute osseous or soft tissue abnormality. Postsurgical changes in the left shoulder are noted. Upper abdominal biliary stent is noted. IMPRESSION: Small patchy opacity in the right lung base which could reflect early infection versus confluence vessels and airways. Electronically Signed   By: Lovena Le M.D.   On: 07/05/2019 21:07   CT IMAGE GUIDED DRAINAGE BY PERCUTANEOUS CATHETER  Result Date: 07/06/2019 CLINICAL DATA:  Hepatic abscess and history of metastatic colon carcinoma. EXAM: CT GUIDED CATHETER DRAINAGE OF HEPATIC ABSCESS ANESTHESIA/SEDATION: 1.0 mg IV Versed 50 mcg IV Fentanyl Total Moderate Sedation Time:  19 minutes The patient's level of consciousness and physiologic status were continuously monitored during the procedure by Radiology nursing. PROCEDURE: The procedure, risks, benefits, and alternatives were explained to the patient. Questions regarding the procedure were encouraged and answered. The patient understands and consents to the procedure. A time out  was performed prior to initiating the procedure. The right abdominal wall was prepped with chlorhexidine in a sterile fashion, and a sterile drape was applied covering the operative field. A sterile gown and sterile gloves were used for the procedure. Local anesthesia was provided with 1% Lidocaine. CT was performed through the upper abdomen in a supine position. Under CT guidance, an 18 gauge trocar needle was advanced into a right lobe hepatic abscess. Aspiration of fluid was performed. A guidewire was advanced into the collection and the needle removed. The tract was dilated and a 10 French percutaneous drainage catheter placed. Drainage catheter position was confirmed by CT. The drain was aspirated for additional fluid sample. The drainage catheter was then flushed with saline and connected to a suction bulb. The catheter was secured at the skin with a Prolene retention suture and StatLock device. COMPLICATIONS: None FINDINGS: The large right lobe hepatic abscess cavity yielded grossly purulent fluid. After placement of the 10 French drain, there is good return of brownish colored purulent fluid. IMPRESSION: CT-guided percutaneous catheter drainage of right lobe hepatic abscess yielding purulent fluid. A fluid sample was sent for culture analysis. A 10 French drainage catheter was placed and attached to suction bulb drainage. Electronically Signed   By: Aletta Edouard M.D.   On: 07/06/2019 15:14    PERFORMANCE STATUS (ECOG) : 4 - Bedbound  Review of Systems Unable to complete  Physical Exam General: Ill-appearing Cardiovascular: regular rate and rhythm Pulmonary: Unlabored Extremities: no edema, no joint deformities Skin: no rashes Neurological: Lethargic  IMPRESSION: Patient lethargic.  Withdraws from noxious stimuli.  Patient was started on transdermal fentanyl yesterday.  AMS is likely multifactorial but certainly could be exacerbated due to opioids.  I met with patient's mother and spoke  to patient's husband by phone.  Both verbalized understanding that patient's prognosis is poor and that she could be nearing end-of-life.  We discussed the options of SNF for continued IV antibiotics versus a transition to the hospice home for end-of-life care.  I think that patient would be at high risk for recurrent hospitalization if family pursue SNF. Additionally, patient has no appreciable oral intake.  Mother asked about artificial nutrition and we discussed the limited benefits in the setting of advanced illness. She does not think patient should have a PEG.   Both patient's husband and mother verbalized a desire for patient not to be resuscitated or have her life prolonged artificially on machines.  They were both in agreement with changing CODE STATUS to DNR.  PLAN: -Continue current scope of treatment -DNR/DNI -DC transdermal fentanyl -TOC consult for SNF bed search -Will follow   Time Total: 45 minutes  Visit consisted of counseling and education dealing with the complex and emotionally intense issues of symptom management and palliative care in the setting of serious and potentially life-threatening illness.Greater than 50%  of this time was spent counseling and coordinating care related to the above assessment and plan.  Signed by: Altha Harm, PhD, NP-C

## 2019-07-09 NOTE — Progress Notes (Signed)
Pharmacy - Antibiotic Dosing (Brief Note)  Adjust vancomycin to 500mg  IV q24h for AUC = 478  (using SCr = 0.8 and TBW for calculations as weight less than IBW)  Doreene Eland, PharmD, BCPS.   Work Cell: [phone] 07/09/2019 4:06 PM

## 2019-07-09 NOTE — Progress Notes (Signed)
Physical Therapy Treatment Patient Details Name: Nancy Clark MRN: [id number] DOB: 1961/03/18 Today's Date: 07/09/2019    History of Present Illness Pt admitted for liver abscess with mets with complaints of abdominal pain. History includes HTN, depression, colon ca on chemo, neuropathy, and GI bleed    PT Comments    Pt is making gradual progress towards goals with ability to ambulate further in the room this session while using youth RW. Improved stability noted. Very lethargic this shift, needs multiple cues to stay awake. Will continue to progress as able.  Follow Up Recommendations  Home health PT;Supervision/Assistance - 24 hour     Equipment Recommendations  Rolling walker with 5" wheels    Recommendations for Other Services       Precautions / Restrictions Precautions Precautions: Fall Restrictions Weight Bearing Restrictions: No    Mobility  Bed Mobility Overal bed mobility: Needs Assistance Bed Mobility: Supine to Sit     Supine to sit: Min assist     General bed mobility comments: needs assist for sliding B LEs over on bed as well as trunk support while seated at EOB.  Transfers Overall transfer level: Needs assistance Equipment used: 1 person hand held assist Transfers: Sit to/from Stand Sit to Stand: Min assist         General transfer comment: on 1st attempt, uses HHA. Able to achieve upright posture.  Ambulation/Gait Ambulation/Gait assistance: Min assist Gait Distance (Feet): 15 Feet Assistive device: Rolling walker (2 wheeled) Gait Pattern/deviations: Step-to pattern     General Gait Details: ambulated using RW with step to gait pattern in room. Needs cues for safety including correctly using RW. Fatigues quickly   Marine scientist [place] (Stroke Patients Only)       Balance Overall balance assessment: Needs assistance Sitting-balance support: Feet supported;Bilateral upper extremity  supported Sitting balance-Leahy Scale: Good     Standing balance support: Single extremity supported Standing balance-Leahy Scale: Fair                              Cognition Arousal/Alertness: Awake/alert Behavior During Therapy: WFL for tasks assessed/performed Overall Cognitive Status: Impaired/Different from baseline                                        Exercises Other Exercises Other Exercises: Transferred to [facility] with cga. Needs set up and supervision for hygiene. Safe technique performed.  Other Exercises: Supine ther-ex performed including B LE AP, ankle circles, SLRs, and heel slides. Needs heavy cues for sequencing and pariticipation as pt very fatigued. 10 reps performed    General Comments        Pertinent Vitals/Pain Pain Assessment: No/denies pain    Home Living                      Prior Function            PT Goals (current goals can now be found in the care plan section) Acute Rehab PT Goals Patient Stated Goal: to go home PT Goal Formulation: With patient Time For Goal Achievement: 07/22/19 Potential to Achieve Goals: Good Progress towards PT goals: Progressing toward goals    Frequency    Min 2X/week      PT  Plan Current plan remains appropriate    Co-evaluation              AM-PAC PT "6 Clicks" Mobility   Outcome Measure  Help needed turning from your back to your side while in a flat bed without using bedrails?: A Little Help needed moving from lying on your back to sitting on the side of a flat bed without using bedrails?: A Little Help needed moving to and from a bed to a chair (including a wheelchair)?: A Little Help needed standing up from a chair using your arms (e.g., wheelchair or bedside chair)?: A Little Help needed to walk in hospital room?: A Little Help needed climbing 3-5 steps with a railing? : A Little 6 Click Score: 18    End of Session Equipment Utilized During Treatment:  Gait belt Activity Tolerance: Patient limited by lethargy Patient left: in chair;with chair alarm set;with family/visitor present(husband) Nurse Communication: Mobility status PT Visit Diagnosis: Muscle weakness (generalized) (M62.81);Difficulty in walking, not elsewhere classified (R26.2);Unsteadiness on feet (R26.81)     Time: [id number] PT Time Calculation (min) (ACUTE ONLY): 24 min  Charges:  $Gait Training: 8-22 mins $Therapeutic Exercise: 8-22 mins                     Nancy Clark, PT, DPT [phone]    Nancy Clark 07/09/2019, 3:06 PM

## 2019-07-09 NOTE — Progress Notes (Signed)
[facility] [place] NAME: Nancy Clark    MR#:  [id number]  DATE OF BIRTH:  [birth year]  SUBJECTIVE:  patient came in with ongoing abdominal pain was found to have liver abscess. Interventional radiology placed the drain. No fever.  She is lethargic, opens eyes to loud verbal stimuli but falls back to sleep REVIEW OF SYSTEMS:   Review of Systems  Constitutional: Positive for malaise/fatigue and weight loss. Negative for chills and fever.  HENT: Negative for ear discharge, ear pain and nosebleeds.   Eyes: Negative for blurred vision, pain and discharge.  Respiratory: Negative for sputum production, shortness of breath, wheezing and stridor.   Cardiovascular: Negative for chest pain, palpitations, orthopnea and PND.  Gastrointestinal: Positive for abdominal pain. Negative for diarrhea, nausea and vomiting.  Genitourinary: Negative for frequency and urgency.  Musculoskeletal: Negative for back pain and joint pain.  Neurological: Positive for weakness. Negative for sensory change, speech change and focal weakness.  Psychiatric/Behavioral: Negative for depression and hallucinations. The patient is not nervous/anxious.    Tolerating Diet:poor Tolerating PT: SNF DRUG ALLERGIES:  No Known Allergies  VITALS:  Blood pressure 122/70, pulse (!) 55, temperature 99.3 F (37.4 C), temperature source Oral, resp. rate 14, height 5\' 4"  (1.626 m), weight 35.5 kg, SpO2 100 %. PHYSICAL EXAMINATION:   Physical Exam  GENERAL:  59 y.o.-year-old patient lying in the bed with no acute distress. Thin cachectic debilitated chronically  ill appearing EYES: Pupils equal, round, reactive to light and accommodation. No scleral icterus.  Pallor+ HEENT: Head atraumatic, normocephalic. Oropharynx and nasopharynx clear.  NECK:  Supple, no jugular venous distention. No thyroid enlargement, no tenderness.  LUNGS: Normal breath sounds bilaterally, no wheezing, rales,  rhonchi. No use of accessory muscles of respiration. PORT + CARDIOVASCULAR: S1, S2 normal. No murmurs, rubs, or gallops.  ABDOMEN: Soft, +tender right Upper quadrant, nondistended. Bowel sounds present. No organomegaly or mass.  EXTREMITIES: No cyanosis, clubbing or edema b/l.    NEUROLOGIC: Cranial nerves II through XII are intact. No focal Motor or sensory deficits b/l.very weak and deconditioned   PSYCHIATRIC:  patient is lethargic SKIN: No obvious rash, lesion, or ulcer.   LABORATORY PANEL:  CBC Recent Labs  Lab 07/05/19 2034  WBC 6.2  HGB 10.5*  HCT 34.4*  PLT 81*    Chemistries  Recent Labs  Lab 07/09/19 0525  NA 137  K 3.4*  CL 101  CO2 27  GLUCOSE 95  BUN 12  CREATININE 0.67  CALCIUM 7.3*  MG 1.4*  AST 15  ALT 21  ALKPHOS 150*  BILITOT 1.3*   Cardiac Enzymes No results for input(s): TROPONINI in the last 168 hours. RADIOLOGY:  No results found. ASSESSMENT AND PLAN:  Nancy Clark is a 59 y.o. female with medical history significant of hypertension, depression, metastasized colon cancer on chemotherapy, peripheral neuropathy due to chemotherapy, thrombocytopenia, GI bleeding, biliary obstruction, who presents with abdominal pain and fever. Patient states that she has been worsening abdominal pain in the past several days, which is located in the central abdomen, constant, moderate, sharp, nonradiating.  Sepsis due to Large liver abscess and Enterococcus bacteremia: - Patient meets criteria sepsis with fever, tachycardia, elevated lactic acid up to 8.8, which is normalized with IV fluid currently.  -Blood culture is 4 of 4 positive: 2 of 4 GPC and 4 of 4 GNR. BCID: Klebsiella oxytoca, Enterobacter cloacae complex, and Enterococcus species. -Fluid culture Enterococcus fecalis and cloacae -Dr.  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 -Patient has very poor performance status. Spoke with Dr. Rogue Bussing and Merrily Pew [place] nurse practitioner plan to meet patient's mother and husband to discuss current medical condition  Hypernatremia /Hypokalemia: K= 2.8 ->3.4  -Pharmacy consult placed -sodium 152-->146->137 -K and phosphorus being repleted  Thrombocytopenia ([place]): Likely due to chemotherapy.  No active bleeding  -f/u by CBC  Normocytic anemia: Hemoglobin stable.  10.5  Essential hypertension: - cont clonidine patches and amlodipine  Nutrition Status: Nutrition Problem: Severe Malnutrition Etiology: chronic illness(stage IV colon cancer) Signs/Symptoms: percent weight loss, severe fat depletion, severe muscle depletion Percent weight loss: 20.6 % Interventions: Refer to RD note for recommendations  Pt at risk for re-feeding syndrome  Overall poor prognosis.  Appreciate Palliative care input  Procedures: IR drained liver abcess Family communication : I had discussion with patient's spouse [name] at bedside and also over phone later in the day he is leaning towards keeping her comfortable at home and treat the treatable's Consults :ID, oncology Discharge Disposition :TBD CODE STATUS: Full code DVT Prophylaxis :lovneonx Barriers to discharge: Family is not ready to take her yet.  They are arranging for additional support at home likely private caregivers requesting discharge on Friday possible.  Continue ongoing rx for sepsis and liver abscess  TOTAL TIME TAKING CARE OF THIS PATIENT: *35* minutes.  >50% time spent on counselling and coordination of care  Note: This dictation was prepared with Dragon dictation along with smaller phrase technology. Any transcriptional errors that result from this process are unintentional.  Max Sane M.D    [facility]   CC: Primary care physician; Patient, No Pcp PerPatient ID: Nancy Clark, female   DOB: [birth year], 59 y.o.   MRN: [id number]

## 2019-07-09 NOTE — Consult Note (Signed)
Jonathon Bellows , MD [address], [place], [place], [place], [address] [address], [place], [place], [place], [address] Phone: [phone]  Fax: [phone]  Consultation  Referring Provider:     Dr Delaine Lame Primary Care Physician:  Patient, No Pcp Per Primary Gastroenterologist:  Dr. Bonna Gains         Reason for Consultation:    Rule out stent blockage of the biliary tract   Date of Admission:  07/05/2019 Date of Consultation:  07/09/2019         HPI:   Nancy Clark is a 59 y.o. female has a history of metastatic stage IV colon cancer and has been admitted for worsening of abdominal pain and fever.  History of colon cancer diagnosed in 2017.  In December 2020 she underwent an ERCP with placement of a metal stent for malignant extrinsic compression of the bile duct..  At that time her total bilirubin was 8 with an alkaline phosphatase of 472.  Presently she has a normal total bilirubin and a mildly elevated alkaline phosphatase of 150-200.  Normal transaminases.  She has polymicrobial bacteremia felt to be secondary to liver abscess.    In the emergency room she underwent a CT scan of the abdomen which demonstrated an 8.5 cm abscess in the posterior right hepatic lobe.  Abscess directly transverses the anterior right hepatic capsule with associated perihepatic fluid and small volume abdominal pelvic ascites.  2 suspected hepatic lesions which are progressive from the prior and favor metastasis.  Status post right hemicolectomy for colon cancer.  Abnormal soft tissue in the porta hepatis along the celiac axis which is new and progressive from the prior raising concern for tumor.  This appearance is less typical of colon cancer and raises a concern for hepatobiliary tumor such as pancreatic cancer or cholangiocarcinoma.  Associated new dilation of the pancreatic duct.  Associated intraluminal soft tissues into the mid distal common duct which is nonspecific.  She is drowsy but  arousable not in any pain or distress  Past Medical History:  Diagnosis Date  . Chicken pox   . Colon cancer ([place])    Partial colectomy 07/2015 + chemo tx's  . Hypertension   . Hypokalemia   . Menopause    > 5 yrs  . Peripheral neuropathy due to chemotherapy ([place])    bi lat feet    Past Surgical History:  Procedure Laterality Date  . BILIARY STENT PLACEMENT  05/05/2019   Procedure: BILIARY STENT PLACEMENT;  Surgeon: Ronnette Juniper, MD;  Location: [place];  Service: Gastroenterology;;  . BREAST BIOPSY Right    2007? pt unsure done by [facility]  . COLONOSCOPY    . COLONOSCOPY WITH PROPOFOL N/A 10/14/2015   Procedure: COLONOSCOPY WITH PROPOFOL;  Surgeon: Hulen Luster, MD;  [facility] END[facility]COPY;  Service: Endoscopy;  Laterality: N/A;  . COLOSTOMY REVISION Right 08/09/2015   Procedure: COLON RESECTION RIGHT;  Surgeon: Florene Glen, MD;  Location: [facility] ORS;  Service: General;  Laterality: Right;  . ECTOPIC PREGNANCY SURGERY    . ERCP N/A 05/05/2019   Procedure: ENDOSCOPIC RETROGRADE CHOLANGIOPANCREATOGRAPHY (ERCP);  Surgeon: Ronnette Juniper, MD;  Location: [place];  Service: Gastroenterology;  Laterality: N/A;  . IR BONE TUMOR(S)RF ABLATION  05/24/2018  . IR KYPHO LUMBAR INC FX REDUCE BONE BX UNI/BIL CANNULATION INC/IMAGING  05/24/2018  . IR RADIOLOGIST EVAL & MGMT  05/08/2018  . IR RADIOLOGIST EVAL & MGMT  06/18/2018  . IR RADIOLOGIST EVAL & MGMT  07/31/2018  . IR RADIOLOGIST EVAL & MGMT  12/19/2018  . IR RADIOLOGIST EVAL & MGMT  02/27/2019  . PANCREATIC STENT PLACEMENT  05/05/2019   Procedure: PANCREATIC STENT PLACEMENT;  Surgeon: Ronnette Juniper, MD;  [facility] END[facility]COPY;  Service: Gastroenterology;;  . Sol Passer PLACEMENT N/A 09/01/2015   Procedure: INSERTION PORT-A-CATH;  Surgeon: Florene Glen, MD;  Location: [facility] ORS;  Service: General;  Laterality: N/A;  . RADIOLOGY WITH ANESTHESIA N/A 11/06/2018   Procedure: CT-MICROWAVE THERMAL ABLATION/LIVER;  Surgeon: Corrie Mckusick, DO;   Location: [facility] ORS;  Service: Anesthesiology;  Laterality: N/A;  . REMOVAL OF STONES  05/05/2019   Procedure: REMOVAL OF STONES;  Surgeon: Ronnette Juniper, MD;  Location: [place];  Service: Gastroenterology;;  . SHOULDER SURGERY Left   . SPHINCTEROTOMY  05/05/2019   Procedure: SPHINCTEROTOMY;  Surgeon: Ronnette Juniper, MD;  Location[facility] ENDOSCOPY;  Service: Gastroenterology;;    Prior to Admission medications   Medication Sig Start Date End Date Taking? Authorizing Provider  acetaminophen (TYLENOL) 325 MG tablet Take 2 tablets (650 mg total) by mouth every 6 (six) hours as needed for mild pain (or Fever >/= 101). 10/14/15  Yes Gouru, Illene Silver, MD  amLODipine (NORVASC) 5 MG tablet Take 2 tablets (10 mg total) by mouth daily. 05/06/19  Yes Guilford Shi, MD  Calcium Carbonate-Vitamin D (CALCIUM 600/VITAMIN D PO) Take 1 tablet by mouth every evening.    Yes [provider]  cloNIDine (CATAPRES - DOSED IN MG/24 HR) 0.1 mg/24hr patch Place 1 patch (0.1 mg total) onto the skin every Monday at 6 PM. 05/12/19  Yes Guilford Shi, MD  diphenoxylate-atropine (LOMOTIL) 2.5-0.025 MG tablet Take 1 tablet by mouth 4 (four) times daily as needed for diarrhea or loose stools. Take it along with immodium 04/21/19  Yes Cammie Sickle, MD  DULoxetine (CYMBALTA) 20 MG capsule TAKE 2 CAPSULES DAILY Patient taking differently: Take 40 mg by mouth daily.  05/19/19  Yes Cammie Sickle, MD  fentaNYL (DURAGESIC) 50 MCG/HR Place 1 patch onto the skin every 3 (three) days. 07/02/19  Yes Cammie Sickle, MD  gabapentin (NEURONTIN) 300 MG capsule Take 1-2 capsules (300-600 mg total) by mouth See admin instructions. Take 1 capsule (300 mg) by mouth daily in the morning & take 2 capsules (600 mg) by mouth at night. 12/30/18  Yes Cammie Sickle, MD  HYDROcodone-acetaminophen (NORCO/VICODIN) 5-325 MG tablet Take 1 tablet by mouth every 8 (eight) hours as needed (pain.). 06/11/19  Yes Cammie Sickle, MD  lidocaine-prilocaine (EMLA) cream Apply 1 application topically as needed. Apply generously over the Mediport 45 minutes prior to chemotherapy. Patient taking differently: Apply 1 application topically as needed (prior to chemotherapy.).  05/27/18  Yes Cammie Sickle, MD  loperamide (IMODIUM) 2 MG capsule Take 2 mg by mouth 4 (four) times daily as needed for diarrhea or loose stools.    Yes [provider]  magic mouthwash w/lidocaine SOLN Take 5 mLs by mouth 4 (four) times daily. 07/02/19  Yes Cammie Sickle, MD  Multiple Vitamin (MULTIVITAMIN WITH MINERALS) TABS tablet Take 1 tablet by mouth daily.   Yes [provider]  ondansetron (ZOFRAN) 8 MG tablet One pill every 8 hours as needed for nausea/vomitting. Patient taking differently: Take 8 mg by mouth every 8 (eight) hours as needed for nausea or vomiting.  09/09/18  Yes Cammie Sickle, MD  potassium chloride SA (KLOR-CON) 20 MEQ tablet Take 1 tablet (20 mEq total) by mouth 3 (three)  times daily. 06/11/19  Yes Cammie Sickle, MD  prochlorperazine (COMPAZINE) 10 MG tablet Take 1 tablet (10 mg total) by mouth every 6 (six) hours as needed for nausea or vomiting. 05/27/18  Yes Brahmanday, Elisha Headland, MD  STIVARGA 40 MG tablet TAKE 2 TABLETS (80 MG TOTAL) BY MOUTH DAILY WITH BREAKFAST. TAKE FOR 3 WEEKS; AND 1 WEEK OFF. TAKE WITH LOW FAT MEAL. DO NOT START UNTIL OK BY MD. Patient taking differently: Take 80 mg by mouth See admin instructions. Take 2 tablets (80mg ) by mouth daily with breakfast for three weeks then stop for one week 06/26/19  Yes Cammie Sickle, MD    Family History  Problem Relation Age of Onset  . Hypertension Mother   . Arthritis Father   . Prostate cancer Father   . Diabetes Maternal Grandmother   . Colon cancer Maternal Grandfather        colon     Social History   Tobacco Use  . Smoking status: Former Smoker    Packs/day: 0.25    Years: 2.00    Pack years:  0.50  . Smokeless tobacco: Never Used  . Tobacco comment: recently quit  Substance Use Topics  . Alcohol use: No    Alcohol/week: 0.0 standard drinks  . Drug use: No    Allergies as of 07/05/2019  . (No Known Allergies)    Review of Systems:    All systems reviewed and negative except where noted in HPI.   Physical Exam:  Vital signs in last 24 hours: Temp:  [97.9 F (36.6 C)-98.4 F (36.9 C)] 98 F (36.7 C) (02/17 0516) Pulse Rate:  [58-66] 63 (02/17 0516) Resp:  [16-24] 16 (02/17 0516) BP: (127-157)/(66-82) 127/72 (02/17 0516) SpO2:  [99 %-100 %] 100 % (02/17 0516) Last BM Date: 07/05/19 General:  Very droswy and lethargic  Head:  Normocephalic and atraumatic. Eyes:   No icterus.   Conjunctiva pink. PERRLA. Ears:  Normal auditory acuity. Neck:  Supple; no masses or thyroidomegaly Abdomen:  Soft, nondistended, nontender. Normal bowel sounds. No appreciable masses or hepatomegaly.  No rebound or guarding. Drain noted  Neurologic:  Alert and oriented x3;  grossly normal neurologically. Skin:  Intact without significant lesions or rashes. Cervical Nodes:  No significant cervical adenopathy. Psych:  drowsy and cooperative. Normal affect.  LAB RESULTS: No results for input(s): WBC, HGB, HCT, PLT in the last 72 hours. BMET Recent Labs    07/07/19 0356 07/07/19 0356 07/07/19 1804 07/08/19 0321 07/09/19 0525  NA 152*  --   --  146* 137  K 2.4*   < > 3.1* 3.3* 3.4*  CL 116*  --   --  111 101  CO2 28  --   --  29 27  GLUCOSE 110*  --   --  246* 95  BUN 27*  --   --  20 12  CREATININE 0.88  --   --  0.73 0.67  CALCIUM 7.4*  --   --  8.1* 7.3*   < > = values in this interval not displayed.   LFT Recent Labs    07/07/19 0356  PROT 4.6*  ALBUMIN 1.6*  AST 36  ALT 33  ALKPHOS 156*  BILITOT 1.2   PT/INR No results for input(s): LABPROT, INR in the last 72 hours.  STUDIES: No results found.    Impression / Plan:   RANYLA DEYO is a 59 y.o. y/o female  with history of metastatic colon cancer  stage IV with malignant compression of the bile ducts noted in December 2020 and underwent an ERCP and placement of a metal stent in December 2020.  She has been admitted with abdominal pain, fever, polymicrobial bacteremia and noted to have a large liver abscess.  I have been called to determine if she has blockage of her stent which was placed in December.  Review of her labs suggest that her total bilirubin and alkaline phosphatase are probably the lowest it has ever been compared to December.  This makes it less likely that the stent is blocked.  MRCP per radiology not possible as patient unable to stay still. Hence suggest best option is to continue antibiotics and reimage abscess in a few days. Monitor LFT's, Plan discussed with Dr Manuella Ghazi and Dr Rogue Bussing   I will sign off.  Please call me if any further GI concerns or questions.  We would like to thank you for the opportunity to participate in the care of EMILYA DEWITT.   Thank you for involving me in the care of this patient.      LOS: 3 days   Jonathon Bellows, MD  07/09/2019, 10:54 AM

## 2019-07-09 NOTE — TOC Progression Note (Signed)
Transition of Care [facility]) - Progression Note    Patient Details  Name: Nancy Clark MRN: [id number] Date of Birth: [birth year]  [facility]) CM/SW Contact  Candie Chroman, LCSW Phone Number: 07/09/2019, 2:16 PM  Clinical Narrative: Notified Carolynn Sayers with [facility] regarding plan for home IV abx for 2-4 weeks.  Expected Discharge Plan: Home/Self Care Barriers to Discharge: Continued Medical Work up  Expected Discharge Plan and Services Expected Discharge Plan: Home/Self Care   Discharge Planning Services: CM Consult   Living arrangements for the past 2 months: Single Family Home                                       Social Determinants of Health (SDOH) Interventions    Readmission Risk Interventions Readmission Risk Prevention Plan 07/08/2019 05/06/2019  Transportation Screening Complete Complete  HRI or [place] - Complete  Social Work Consult for [place] Planning/Counseling - Complete  Palliative Care Screening - Not Applicable  Medication Review Press photographer) Complete Complete  HRI or Home Care Consult Complete -  Palliative Care Screening Complete -  Some recent data might be hidden

## 2019-07-10 LAB — CBC
HCT: 21.5 % — ABNORMAL LOW (ref 36.0–46.0)
Hemoglobin: 6.8 g/dL — ABNORMAL LOW (ref 12.0–15.0)
MCH: 24.8 pg — ABNORMAL LOW (ref 26.0–34.0)
MCHC: 31.6 g/dL (ref 30.0–36.0)
MCV: 78.5 fL — ABNORMAL LOW (ref 80.0–100.0)
Platelets: 39 10*3/uL — ABNORMAL LOW (ref 150–400)
RBC: 2.74 MIL/uL — ABNORMAL LOW (ref 3.87–5.11)
RDW: 21 % — ABNORMAL HIGH (ref 11.5–15.5)
WBC: 13.2 10*3/uL — ABNORMAL HIGH (ref 4.0–10.5)
nRBC: 0 % (ref 0.0–0.2)

## 2019-07-10 LAB — RENAL FUNCTION PANEL
Albumin: 1.6 g/dL — ABNORMAL LOW (ref 3.5–5.0)
Anion gap: 7 (ref 5–15)
BUN: 14 mg/dL (ref 6–20)
CO2: 25 mmol/L (ref 22–32)
Calcium: 7.1 mg/dL — ABNORMAL LOW (ref 8.9–10.3)
Chloride: 102 mmol/L (ref 98–111)
Creatinine, Ser: 0.81 mg/dL (ref 0.44–1.00)
GFR calc Af Amer: >60 mL/min (ref >60)
GFR calc non Af Amer: >60 mL/min (ref >60)
Glucose, Bld: 68 mg/dL — ABNORMAL LOW (ref 70–99)
Phosphorus: 3.2 mg/dL (ref 2.5–4.6)
Potassium: 3.9 mmol/L (ref 3.5–5.1)
Sodium: 134 mmol/L — ABNORMAL LOW (ref 135–145)

## 2019-07-10 LAB — AEROBIC/ANAEROBIC CULTURE W GRAM STAIN (SURGICAL/DEEP WOUND): Special Requests: NORMAL

## 2019-07-10 LAB — LACTATE DEHYDROGENASE: LDH: 142 U/L (ref 98–192)

## 2019-07-10 LAB — HEPATIC FUNCTION PANEL
ALT: 16 U/L (ref 0–44)
AST: 12 U/L — ABNORMAL LOW (ref 15–41)
Albumin: 2 g/dL — ABNORMAL LOW (ref 3.5–5.0)
Alkaline Phosphatase: 169 U/L — ABNORMAL HIGH (ref 38–126)
Bilirubin, Direct: 0.6 mg/dL — ABNORMAL HIGH (ref 0.0–0.2)
Indirect Bilirubin: 1.1 mg/dL — ABNORMAL HIGH (ref 0.3–0.9)
Total Bilirubin: 1.7 mg/dL — ABNORMAL HIGH (ref 0.3–1.2)
Total Protein: 5.5 g/dL — ABNORMAL LOW (ref 6.5–8.1)

## 2019-07-10 LAB — GLUCOSE, CAPILLARY: Glucose-Capillary: 66 mg/dL — ABNORMAL LOW (ref 70–99)

## 2019-07-10 LAB — PREPARE RBC (CROSSMATCH)

## 2019-07-10 LAB — MAGNESIUM: Magnesium: 2.2 mg/dL (ref 1.7–2.4)

## 2019-07-10 MED ORDER — POLYETHYLENE GLYCOL 3350 17 G PO PACK
17.0000 g | PACK | Freq: Every day | ORAL | Status: DC | PRN
  Administered 2019-07-11: 17 g via ORAL
  Filled 2019-07-10: qty 1

## 2019-07-10 MED ORDER — SODIUM CHLORIDE 0.9% IV SOLUTION: Freq: Once | INTRAVENOUS | Status: AC

## 2019-07-10 MED ORDER — SODIUM CHLORIDE 0.9 % IV SOLN: INTRAVENOUS | Status: AC

## 2019-07-10 MED ORDER — SODIUM CHLORIDE 0.9 % IV SOLN
1.0000 g | Freq: Two times a day (BID) | INTRAVENOUS | Status: DC
  Administered 2019-07-10 – 2019-07-12 (×5): 1 g via INTRAVENOUS
  Filled 2019-07-10 (×6): qty 1

## 2019-07-10 NOTE — Progress Notes (Signed)
Dr. Manuella Ghazi was already aware of her swelling  left arm. Pt , refused most of her oral medication today. She was also exhibiting intermittent confusion. Husband was at bedside.

## 2019-07-10 NOTE — Progress Notes (Signed)
[place] for Electrolyte Monitoring and Replacement   Recent Labs: Potassium (mmol/L)  Date Value  07/10/2019 3.9   Magnesium (mg/dL)  Date Value  07/10/2019 2.2   Calcium (mg/dL)  Date Value  07/10/2019 7.1 (L)   Albumin (g/dL)  Date Value  07/10/2019 1.6 (L)   Phosphorus (mg/dL)  Date Value  07/10/2019 3.2   Sodium (mmol/L)  Date Value  07/10/2019 134 (L)   Corrected Ca: 9.02 mg/dL  Assessment: 59 year old female admitted with weakness and abdominal pain. Patient with positive blood cultures and hepatic abscess s/p drain placement. Patient with electrolyte abnormalities on presentation. Patient at refeeding risk. Pharmacy consulted to manage electrolytes. Over the previous 24 hours the patient received approximately 60 mEq IV potassium and 20 mmol IV phosphorous. Fluids were changed on 2/16from D520 at 100 mL/hr to 0.45%NS w. 20 mEq KCl/L at 75 mL/hr  Goal of Therapy:  Electrolytes WNL  Plan: .  Sodium trending lower: Dr Manuella Ghazi has changed the fluids to 0.9% NS at 75 mL/hr  No further electrolyte supplementation required  Next electrolytes in am   Dallie Piles,  07/10/2019 7:38 AM

## 2019-07-10 NOTE — Progress Notes (Signed)
[place]  Telephone:([phone] [phone]   Name: Nancy Clark Date: 07/10/2019 MRN: [id number]  DOB: [birth year]  Patient Care Team: Patient, No Pcp Per as PCP - General (General Practice)    REASON FOR CONSULTATION: Nancy Clark a 59 y.o.femalewith multiple medical problems including stage IV colorectal cancer status post right hemicolectomy and XRT. She is also status post liver ablation to area of hepatic metastasis. Patient isstatus post5-FU plus bevacizumaband is now on treatment with nivolumab. Recent CT scan on 05/02/2019 revealed new biliary obstruction at the distal CBD due to compressive malignancy with biliary obstruction of the left and right hepatic lobes with 3 new hepatic lesions. Patient underwent biliary stenting on 05/05/2019. Patient has had rising CEA levels. Opdivo has been held due to fevers.   Patient was admitted to the hospital on 07/06/2019 with fever and abdominal pain.  CT of abdomen/pelvis revealed an 8.5 cm abscess in the posterior right hepatic lobe.  Patient was also found to have progressive hepatic lesions with abnormal soft tissue in the porta hepatis concerning for tumor.  Patient is status post CT-guided drain of her hepatic abscess.  She was referred to palliative care to help discuss goals and manage ongoing symptoms.   CODE STATUS: Full code  PAST MEDICAL HISTORY: Past Medical History:  Diagnosis Date  . Chicken pox   . Colon cancer ([place])    Partial colectomy 07/2015 + chemo tx's  . Hypertension   . Hypokalemia   . Menopause    > 5 yrs  . Peripheral neuropathy due to chemotherapy ([place])    bi lat feet    PAST SURGICAL HISTORY:  Past Surgical History:  Procedure Laterality Date  . BILIARY STENT PLACEMENT  05/05/2019   Procedure: BILIARY STENT PLACEMENT;  Surgeon: Nancy Juniper, MD;  Location: [place];  Service: Gastroenterology;;  . BREAST BIOPSY Right    2007? pt unsure done by [facility]  . COLONOSCOPY    . COLONOSCOPY WITH PROPOFOL N/A 10/14/2015   Procedure: COLONOSCOPY WITH PROPOFOL;  Surgeon: Nancy Luster, MD;  Location: [facility] ENDOSCOPY;  Service: Endoscopy;  Laterality: N/A;  . COLOSTOMY REVISION Right 08/09/2015   Procedure: COLON RESECTION RIGHT;  Surgeon: Nancy Glen, MD;  Location: [facility] ORS;  Service: General;  Laterality: Right;  . ECTOPIC PREGNANCY SURGERY    . ERCP N/A 05/05/2019   Procedure: ENDOSCOPIC RETROGRADE CHOLANGIOPANCREATOGRAPHY (ERCP);  Surgeon: Nancy Juniper, MD;  Location: [place];  Service: Gastroenterology;  Laterality: N/A;  . IR BONE TUMOR(S)RF ABLATION  05/24/2018  . IR KYPHO LUMBAR INC FX REDUCE BONE BX UNI/BIL CANNULATION INC/IMAGING  05/24/2018  . IR RADIOLOGIST EVAL & MGMT  05/08/2018  . IR RADIOLOGIST EVAL & MGMT  06/18/2018  . IR RADIOLOGIST EVAL & MGMT  07/31/2018  . IR RADIOLOGIST EVAL & MGMT  12/19/2018  . IR RADIOLOGIST EVAL & MGMT  02/27/2019  . PANCREATIC STENT PLACEMENT  05/05/2019   Procedure: PANCREATIC STENT PLACEMENT;  Surgeon: Nancy Juniper, MD;  Location: [facility] ENDOSCOPY;  Service: Gastroenterology;;  . Sol Passer PLACEMENT N/A 09/01/2015   Procedure: INSERTION PORT-A-CATH;  Surgeon: Nancy Glen, MD;  Location: [facility] ORS;  Service: General;  Laterality: N/A;  . RADIOLOGY WITH ANESTHESIA N/A 11/06/2018   Procedure: CT-MICROWAVE THERMAL ABLATION/LIVER;  Surgeon: Nancy Mckusick, DO;  Location: [facility] ORS;  Service: Anesthesiology;  Laterality: N/A;  . REMOVAL OF STONES  05/05/2019   Procedure: REMOVAL OF STONES;  Surgeon: Nancy Juniper, MD;  Location: [facility] ENDOSCOPY;  Service: Gastroenterology;;  . SHOULDER SURGERY Left   . SPHINCTEROTOMY  05/05/2019   Procedure: SPHINCTEROTOMY;  Surgeon: Nancy Juniper, MD;  Location: [facility] ENDOSCOPY;  Service: Gastroenterology;;    HEMATOLOGY/ONCOLOGY HISTORY:  Oncology History Overview Note  # FEB-MARCH 2017- COLON CANCER- STAGE IV [s/p right hemi-colectomy; pT4apN2 (7/19LN)]; Pre-op  CEA- [id number]  PET-  mesenteric adenopathy/mediastinal adenopathy/right-sided subclavicular lymph node.    April 17th -START FOLFOX; May 1st Add Avastin;   Aug 16th- Disc OX [sec PN]; con 5FU-Avastin; NOV 27th CT C/A/P- CR; except for 68m LUL; 5FU-Avastin q 3 W [Poor tolerance to FOLFOX].  # June/July 2019- Progression of Liver/Abd LN; July 2019- START FOLFIRI + Avastin; STOP iri 02/25/2018- sec to diarrhea; cont Avastin + 5FU  # March 2018- L2 uptake MET s/p RT [[facility]2018]; mid April X-geva  # L1 ablation/ostecool-  Jan 3rd 2020-   # march 2nd 2020- decrease 5FU CIV  to 20075mm2/  #June 17th, 2020-liver ablation; 3.5 cm lesion.  Intolerance to 5-FU plus Avastin.  Discontinued; Side effects/weight loss/ quality of life  #July 13th -Lonsurf cycle #1; x2 cycles; September 2020-progressive disease in the mesenteric region; liver disease stable;  #March 05, 2019-5-FU plus Avastin [peripheral neuropathy/diarrhea]; DEC 2020- Progression,  # JAN 2021- OPDIVO + REGO  # July 2nd CT scan-right laryngeal nerve palsy/hoarseness of voice [Dr. McQueen] CT-chest NEG; MRI brain negative.  # MOLECULAR TESTING: K-ras-EXON 2- MUTATED; MSI- STABLE.   # palliative care- Nancy Clark.   DIAGNOSIS: colon ca  STAGE:  IV ;GOALS: pallaitive  CURRENT/MOST RECENT THERAPY: Opdivo [C]+ Regorefenib.     Cancer of ascending colon metastatic to intra-abdominal lymph node [place] 05/28/[place] -  Chemotherapy   The patient had nivolumab (OPDIVO) 240 mg in sodium chloride 0.9 % 100 mL chemo infusion, 240 mg, Intravenous, Once, 2 of 6 cycles Administration: 240 mg (05/28/2019), 240 mg (06/11/2019)  for chemotherapy treatment.      ALLERGIES:  has No Known Allergies.  MEDICATIONS:  Current Facility-Administered Medications  Medication Dose Route Frequency Provider Last Rate Last Admin  . 0.45 % NaCl with KCl 20 mEq / L infusion   Intravenous Continuous PaFritzi MandesMD 75 mL/hr at 07/10/19 0749 Rate Verify at  07/10/19 0749  . acetaminophen (TYLENOL) tablet 650 mg  650 mg Oral Q6H PRN PaFritzi MandesMD      . amLODipine (NORVASC) tablet 10 mg  10 mg Oral Daily PaFritzi MandesMD   10 mg at 07/10/19 1018  . calcium-vitamin D (OSCAL WITH D) 500-200 MG-UNIT per tablet 1 tablet  1 tablet Oral q1800 SiCharlett NoseRPH   1 tablet at 07/07/19 1706  . Chlorhexidine Gluconate Cloth 2 % PADS 6 each  6 each Topical Daily NiIvor CostaMD   6 each at 07/07/19 1011  . cloNIDine (CATAPRES - Dosed in mg/24 hr) patch 0.1 mg  0.1 mg Transdermal Q Mon-1800 PaFritzi MandesMD   0.1 mg at 07/07/19 1707  . diphenoxylate-atropine (LOMOTIL) 2.5-0.025 MG per tablet 1 tablet  1 tablet Oral QID PRN NiIvor CostaMD      . DULoxetine (CYMBALTA) DR capsule 40 mg  40 mg Oral Daily NiIvor CostaMD   40 mg at 07/09/19 1150  . enoxaparin (LOVENOX) injection 30 mg  30 mg Subcutaneous Q24H PaFritzi MandesMD   30 mg at 07/09/19 2139  . feeding supplement (BOOST / RESOURCE BREEZE) liquid 1 Container  1 Container Oral TID BM PaFritzi MandesMD  1 Container at 07/09/19 1630  . gabapentin (NEURONTIN) capsule 300 mg  300 mg Oral Daily Charlett Nose, RPH   300 mg at 07/10/19 1018  . gabapentin (NEURONTIN) capsule 600 mg  600 mg Oral QHS Charlett Nose, RPH   600 mg at 07/09/19 2128  . ibuprofen (ADVIL) tablet 200 mg  200 mg Oral Q6H PRN Ivor Costa, MD      . magic mouthwash  10 mL Oral QID Nancy Mandes, MD   10 mL at 07/09/19 2131   And  . lidocaine (XYLOCAINE) 2 % viscous mouth solution 10 mL  10 mL Mouth/Throat QID Nancy Mandes, MD   10 mL at 07/09/19 2135  . loperamide (IMODIUM) capsule 2 mg  2 mg Oral QID PRN Ivor Costa, MD      . menthol-cetylpyridinium (CEPACOL) lozenge 3 mg  1 lozenge Oral PRN Nancy Mandes, MD      . morphine 2 MG/ML injection 1 mg  1 mg Intravenous Q8H PRN Max Sane, MD      . multivitamin with minerals tablet 1 tablet  1 tablet Oral Daily Ivor Costa, MD   1 tablet at 07/10/19 1018  . ondansetron (ZOFRAN) injection 4 mg   4 mg Intravenous Q8H PRN Ivor Costa, MD   4 mg at 07/09/19 0509  . oxyCODONE (Oxy IR/ROXICODONE) immediate release tablet 5 mg  5 mg Oral Q6H PRN Ivor Costa, MD   5 mg at 07/09/19 1156  . pantoprazole (PROTONIX) EC tablet 40 mg  40 mg Oral Q1200 Ivor Costa, MD   40 mg at 07/09/19 1435  . piperacillin-tazobactam (ZOSYN) IVPB 3.375 g  3.375 g Intravenous Q8H Hallaji, Sheema M, RPH 12.5 mL/hr at 07/10/19 0749 Rate Verify at 07/10/19 0749  . sodium chloride flush (NS) 0.9 % injection 5 mL  5 mL Intracatheter Q8H Aletta Edouard, MD   5 mL at 07/10/19 9794   Facility-Administered Medications Ordered in Other Encounters  Medication Dose Route Frequency Provider Last Rate Last Admin  . sodium chloride flush (NS) 0.9 % injection 10 mL  10 mL Intravenous PRN Cammie Sickle, MD   10 mL at 10/04/15 0901  . sodium chloride flush (NS) 0.9 % injection 10 mL  10 mL Intravenous PRN Cammie Sickle, MD   10 mL at 07/02/17 1020    VITAL SIGNS: BP 103/71 (BP Location: Left Arm)   Pulse 75   Temp 98.5 F (36.9 C) (Oral)   Resp 20   Ht _0  (1.626 m)   Wt 78 lb 4.2 oz (35.5 kg)   LMP  (LMP Unknown) Comment: LMP MORE THAN 5 YRS  SpO2 100%   BMI 13.43 kg/m  Filed Weights   07/05/19 2007 07/06/19 1522  Weight: 74 lb 8.3 oz (33.8 kg) 78 lb 4.2 oz (35.5 kg)    Estimated body mass index is 13.43 kg/m as calculated from the following:   Height as of this encounter: _1  (1.626 m).   Weight as of this encounter: 78 lb 4.2 oz (35.5 kg).  LABS: CBC:    Component Value Date/Time   WBC 13.2 (H) 07/10/2019 0540   HGB 6.8 (L) 07/10/2019 0540   HCT 21.5 (L) 07/10/2019 0540   PLT 39 (L) 07/10/2019 0540   MCV 78.5 (L) 07/10/2019 0540   NEUTROABS 5.7 07/05/2019 2034   LYMPHSABS 0.2 (L) 07/05/2019 2034   MONOABS 0.1 07/05/2019 2034   EOSABS 0.1 07/05/2019 2034   BASOSABS 0.1 07/05/2019 2034  Comprehensive Metabolic Panel:    Component Value Date/Time   NA 134 (L) 07/10/2019 0540   K 3.9  07/10/2019 0540   CL 102 07/10/2019 0540   CO2 25 07/10/2019 0540   BUN 14 07/10/2019 0540   CREATININE 0.81 07/10/2019 0540   GLUCOSE 68 (L) 07/10/2019 0540   CALCIUM 7.1 (L) 07/10/2019 0540   AST 15 07/09/2019 0525   ALT 21 07/09/2019 0525   ALKPHOS 150 (H) 07/09/2019 0525   BILITOT 1.3 (H) 07/09/2019 0525   PROT 4.4 (L) 07/09/2019 0525   ALBUMIN 1.6 (L) 07/10/2019 0540    RADIOGRAPHIC STUDIES: CT ABDOMEN PELVIS W CONTRAST  Result Date: 07/05/2019 CLINICAL DATA:  Abdominal pain, fever, decreased appetite/PO intake. History of colon cancer. EXAM: CT ABDOMEN AND PELVIS WITH CONTRAST TECHNIQUE: Multidetector CT imaging of the abdomen and pelvis was performed using the standard protocol following bolus administration of intravenous contrast. CONTRAST:  41m OMNIPAQUE IOHEXOL 300 MG/ML  SOLN COMPARISON:  CT abdomen/pelvis dated 05/02/2019 and 02/24/2019. FINDINGS: Lower chest: Lung bases are clear. Hepatobiliary: 6.9 x 6.5 x 8.5 cm abscess with intraluminal gas in the posterior right hepatic lobe (series 2/image 9). This likely transgresses right anterior liver capsule (series 2/image 21; sagittal image 15). This region of the liver includes the prior ablation zone. Given intraluminal gas and the presence of an indwelling common duct stent, communication between the abscess in the biliary tree is suspected. At least one discrete 2.1 cm lesion is present in segment 4 B (series 2/image 23), previously 1.3 cm, favoring a metastasis. An additional 2.6 x 1.0 cm lesion anterior to the abscess is suspected in segment 5 (series 2/image 15), progressive from the prior, but difficult to definitively distinguish from the abscess. Mild pneumobilia in the left hepatic lobe. Mild periportal edema. Intraluminal soft tissue density within the mid/distal common duct stent (coronal image 22), nonspecific. Pancreas: Dilated pancreatic duct, measuring up to 7 mm in the proximal pancreatic tail (series 2/image 25), new.  No definite pancreatic atrophy. Spleen: Within normal limits. Adrenals/Urinary Tract: Adrenal glands are within normal limits. Kidneys are within normal limits.  No hydronephrosis. Thick-walled bladder, although underdistended. Stomach/Bowel: Stomach is within normal limits. No evidence of bowel obstruction. Status post right hemicolectomy with suture line in the right mid abdomen (series 2/image 38). Colon is decompressed. Vascular/Lymphatic: No evidence of abdominal aortic aneurysm. Atherosclerotic calcifications of the abdominal aorta and branch vessels. Abnormal soft tissue in the porta hepatis along the celiac axis (series 2/image 20), new/progressive from the prior, raising concern for tumor. This appearance is less typical of colon cancer and raises concern for hepatobiliary tumor such as pancreatic cancer or cholangiocarcinoma. Otherwise, no suspicious abdominopelvic lymphadenopathy. Reproductive: Uterus is within normal limits. Bilateral ovaries are unremarkable. Other: Small volume abdominopelvic ascites, including perihepatic fluid which directly communicates with the abscess, as described above. Musculoskeletal: Sclerosis involving the L2 vertebral body with prior vertebral augmentation. IMPRESSION: 8.5 cm abscess in the posterior right hepatic lobe, as described above. Abscess directly transgresses the anterior right hepatic capsule with associated perihepatic fluid and small volume abdominopelvic ascites. Communication between the abscess and biliary tree is suspected. Two suspected hepatic lesions which are progressive from the prior and favor metastases in this patient status post right hemicolectomy for colon cancer. Abnormal soft tissue in the porta hepatis along the celiac axis, new/progressive from the prior, raising concern for tumor. This appearance is less typical of colon cancer and raises concern for hepatobiliary tumor such as pancreatic cancer or cholangiocarcinoma.  Associated new  dilatation of the pancreatic duct. Associated intraluminal soft tissue in the mid/distal common duct stent, nonspecific. Electronically Signed   By: Julian Hy M.D.   On: 07/05/2019 22:14   DG Chest Port 1 View  Result Date: 07/05/2019 CLINICAL DATA:  Sepsis, 2 days of weakness and decreased appetite with poor p.o. intake EXAM: PORTABLE CHEST 1 VIEW COMPARISON:  CT chest 11/21/2018 FINDINGS: There is a small patchy opacity in the right lung base which could reflect early infection versus confluence vessels and airways. No other airspace opacity is seen. No pneumothorax or effusion. Accessed left subclavian approach Port-A-Cath tip terminates in the lower SVC. The aorta is calcified. The remaining cardiomediastinal contours are unremarkable. No acute osseous or soft tissue abnormality. Postsurgical changes in the left shoulder are noted. Upper abdominal biliary stent is noted. IMPRESSION: Small patchy opacity in the right lung base which could reflect early infection versus confluence vessels and airways. Electronically Signed   By: Lovena Le M.D.   On: 07/05/2019 21:07   CT IMAGE GUIDED DRAINAGE BY PERCUTANEOUS CATHETER  Result Date: 07/06/2019 CLINICAL DATA:  Hepatic abscess and history of metastatic colon carcinoma. EXAM: CT GUIDED CATHETER DRAINAGE OF HEPATIC ABSCESS ANESTHESIA/SEDATION: 1.0 mg IV Versed 50 mcg IV Fentanyl Total Moderate Sedation Time:  19 minutes The patient's level of consciousness and physiologic status were continuously monitored during the procedure by Radiology nursing. PROCEDURE: The procedure, risks, benefits, and alternatives were explained to the patient. Questions regarding the procedure were encouraged and answered. The patient understands and consents to the procedure. A time out was performed prior to initiating the procedure. The right abdominal wall was prepped with chlorhexidine in a sterile fashion, and a sterile drape was applied covering the operative field.  A sterile gown and sterile gloves were used for the procedure. Local anesthesia was provided with 1% Lidocaine. CT was performed through the upper abdomen in a supine position. Under CT guidance, an 18 gauge trocar needle was advanced into a right lobe hepatic abscess. Aspiration of fluid was performed. A guidewire was advanced into the collection and the needle removed. The tract was dilated and a 10 French percutaneous drainage catheter placed. Drainage catheter position was confirmed by CT. The drain was aspirated for additional fluid sample. The drainage catheter was then flushed with saline and connected to a suction bulb. The catheter was secured at the skin with a Prolene retention suture and StatLock device. COMPLICATIONS: None FINDINGS: The large right lobe hepatic abscess cavity yielded grossly purulent fluid. After placement of the 10 French drain, there is good return of brownish colored purulent fluid. IMPRESSION: CT-guided percutaneous catheter drainage of right lobe hepatic abscess yielding purulent fluid. A fluid sample was sent for culture analysis. A 10 French drainage catheter was placed and attached to suction bulb drainage. Electronically Signed   By: Aletta Edouard M.D.   On: 07/06/2019 15:14    PERFORMANCE STATUS (ECOG) : 4 - Bedbound  Review of Systems Unable to complete  Physical Exam General: Ill-appearing Cardiovascular: regular rate and rhythm Pulmonary: Unlabored Extremities: no edema, no joint deformities Skin: no rashes Neurological: alert, confused  IMPRESSION: Routine follow-up visit.  Patient is more alert today.  She is comfortable appearing.  Pleasantly confused.  Note progressive anemia and thrombocytopenia.  Patient is pending transfusion.  I called and spoke with patient's mother and husband.  Updated them on patient's current status.  Family verbalized a desire to pursue SNF.  They want to continue IV antibiotics  and other supportive measures.  Would  recommend that she be followed by palliative care as patient is at high risk for future decompensation.  PLAN: -Continue current scope of treatment -TOC consult for bed search -SNF with palliative care when medically stable -Will follow   Time Total: 20 minutes  Visit consisted of counseling and education dealing with the complex and emotionally intense issues of symptom management and palliative care in the setting of serious and potentially life-threatening illness.Greater than 50%  of this time was spent counseling and coordinating care related to the above assessment and plan.  Signed by: Altha Harm, PhD, NP-C

## 2019-07-10 NOTE — TOC Progression Note (Signed)
Transition of Care [facility]) - Progression Note    Patient Details  Name: Nancy Clark MRN: [id number] Date of Birth: 17-Aug-1960  Transition of Care [facility]) CM/SW [place], LCSW Phone Number: 07/10/2019, 1:18 PM  Clinical Narrative:   CSW called patient's husband, [name], to discuss discharge plans. Discussed SNF versus home with Home Health services. Informed [name] that at this time, PT is recommending home health. Informed [name] that we can also try for rehab placement at a SNF if that is what the family would like. Informed [name] that we would have to get insurance approval for SNF placement. [name] stated he would like to discuss this with his mother-in-law before making a decision. He agreed to call CSW back after this discussion.     Expected Discharge Plan: Home/Self Care Barriers to Discharge: Continued Medical Work up  Expected Discharge Plan and Services Expected Discharge Plan: Home/Self Care   Discharge Planning Services: CM Consult   Living arrangements for the past 2 months: Single Family Home                                       Social Determinants of Health (SDOH) Interventions    Readmission Risk Interventions Readmission Risk Prevention Plan 07/08/2019 05/06/2019  Transportation Screening Complete Complete  HRI or [place] - Complete  Social Work Consult for [place] Planning/Counseling - Complete  Palliative Care Screening - Not Applicable  Medication Review [employer] photographer) Complete Complete  HRI or Home Care Consult Complete -  Palliative Care Screening Complete -  Some recent data might be hidden

## 2019-07-10 NOTE — Progress Notes (Signed)
Nancy Clark   DOB:1960/08/20   [id number]    Subjective: No acute events overnight.  Patient continues to feel drowsy/easily arousable.  Feels tired/ poor appetite.  Objective:  Vitals:   07/10/19 1152 07/10/19 1454  BP: 135/76 130/74  Pulse: 76 72  Resp: 20 20  Temp: 98.3 F (36.8 C) 98.4 F (36.9 C)  SpO2: 100% 99%     Intake/Output Summary (Last 24 hours) at 07/10/2019 2102 Last data filed at 07/10/2019 1808 Gross per 24 hour  Intake 1359.73 ml  Output 820 ml  Net 539.73 ml    Physical Exam  Constitutional:  Cachectic appearing African-American female patient; pt is alone.   HENT:  Head: Normocephalic and atraumatic.  Mouth/Throat: No oropharyngeal exudate.  Positive for oral ulceration.  Eyes: Pupils are equal, round, and reactive to light.  Cardiovascular: Normal rate and regular rhythm.  Pulmonary/Chest: No respiratory distress. She has no wheezes.  Decreased air entry bilateral bases.  Abdominal: Soft. Bowel sounds are normal. She exhibits no distension and no mass. There is no abdominal tenderness. There is no rebound and no guarding.  Positive for hepatomegaly. Positive for the external drain  Musculoskeletal:        General: No tenderness or edema. Normal range of motion.     Cervical back: Normal range of motion and neck supple.  Neurological:  Drowsy; oriented x2- 3.  Skin: Skin is warm.  Psychiatric: Affect normal.     Labs:  Lab Results  Component Value Date   WBC 13.2 (H) 07/10/2019   HGB 6.8 (L) 07/10/2019   HCT 21.5 (L) 07/10/2019   MCV 78.5 (L) 07/10/2019   PLT 39 (L) 07/10/2019   NEUTROABS 5.7 07/05/2019    Lab Results  Component Value Date   NA 134 (L) 07/10/2019   K 3.9 07/10/2019   CL 102 07/10/2019   CO2 25 07/10/2019    Studies:  No results found.  Cancer of ascending colon metastatic to intra-abdominal lymph node [facility]) #59 year old female patient with metastatic colon cancer status post multiple lines of therapy-that  admitted hospital for fever/worsening abdominal pain; CT scan suggestive of liver abscess/positive blood cultures.   # Colon cancer- cecal/ right-sided; STAGE IV-patient most recently on Opdivo plus Stivarga s/p cycle #1-; February, 15th CT scan and pelvis-8-9 cm liver abscess [see below]; however progressive lesions in the liver/also mesenteric mass-concerning for progressive disease.  Not a candidate for any further chemotherapy at this time.  #Worsening anemia hemoglobin 6.8/thrombocytopenia-anemia likely secondary to infection/underlying malignancy-agree with unit of PRBC transfusion.  Thrombocytopenia platelet in 30s-suspect secondary to antibiotics.  Discussed with ID-switch to meropenem.  Appreciate recommendations.  Check HIT antibodies-although clinically less likely.  Stop Lovenox.  Would not anticoagulate with DTI-given low chance of probability.   #Liver abscess s/p drainage-positive for bacteremia/blood cultures- on IV antibiotics; defer to primary team/ID for management.  #DNR/DNI-  #Disposition-patient family/mother interested in continued current scope of care versus hospice.  Interested in rehab placement/understand high risk of readmission-poor prognosis.  Discussed with Dr. Manuella Ghazi; also Dr. Levester Fresh.  Discussed with [employer].      Nancy Sickle, MD 07/10/2019  9:02 PM

## 2019-07-10 NOTE — Progress Notes (Signed)
ID Polymicrobial bacteremia Polymicrobial liver abscess Worsening thrombocytopenia Worsening anemia Will change zosyn to meropenem because of thrombocytopenia Will get ultrasound liver to see whether the bascess size is smaller and whether the catheter is in the right place as the wbc is increasing

## 2019-07-10 NOTE — Consult Note (Signed)
Pharmacy Antibiotic Note  Nancy Clark is a 59 y.o. female admitted on 07/05/2019 with polymicrobial bacteremia.Pharmacy was consulted for meropenem dosing.  IR was previously consulted for drainage, now s/p drainage. ID is now changing Zosyn to meropenem due to thrombocytopenia and discontinued vancomycin. She will need IV antibiotics through port for 2-4 weeks per ID  Plan:  Start meropenem 1 gram IV every 12 hours  Height: 5\' 4"  (162.6 cm) Weight: 78 lb 4.2 oz (35.5 kg) IBW/kg (Calculated) : 54.7  Temp (24hrs), Avg:98.2 F (36.8 C), Min:97.6 F (36.4 C), Max:98.5 F (36.9 C)  Recent Labs  Lab 07/05/19 2034 07/05/19 2248 07/06/19 0220 07/06/19 0620 07/06/19 1702 07/07/19 0356 07/08/19 0321 07/09/19 0525 07/10/19 0540  WBC 6.2  --   --   --   --   --   --   --  13.2*  CREATININE 1.06*  --   --   --   --  0.88 0.73 0.67 0.81  LATICACIDVEN 8.9* 8.8* 2.7* 1.8 1.1  --   --   --   --     Estimated Creatinine Clearance: 43.5 mL/min (by C-G formula based on SCr of 0.81 mg/dL).    No Known Allergies  Antimicrobials this admission: 2/13 cefepime x1 2/13 metronidazole >> 2/14 Zosyn 2/14 >> 2/18 vancomycin 2/14 >> 2/15  2/17>>2/18 meropenem 2/18 >>  Microbiology results: 2/17 Bcx: NGTD 2/14 MRSA PCR: negative 2/13 Bcx: Enterobacter clocae, E faecalis, K oxytoca 2/14 liver abscess: E cloacae, S anginosis, E faecalis  Thank you for allowing pharmacy to be a part of this patient's care.  Dallie Piles 07/10/2019 3:32 PM

## 2019-07-10 NOTE — Progress Notes (Addendum)
[facility] [place] NAME: Nancy Clark    MR#:  [id number]  DATE OF BIRTH:  27-Jul-1960  SUBJECTIVE:  patient came in with ongoing abdominal pain was found to have liver abscess.  S/p drain. No fever.  Hemoglobin dropped to 6.8 and platelets at 39.  Leukocytosis slowly worsening much more alert today has some swelling around her left wrist area likely from IV infiltration REVIEW OF SYSTEMS:   Review of Systems  Constitutional: Positive for malaise/fatigue and weight loss. Negative for chills and fever.  HENT: Negative for ear discharge, ear pain and nosebleeds.   Eyes: Negative for blurred vision, pain and discharge.  Respiratory: Negative for sputum production, shortness of breath, wheezing and stridor.   Cardiovascular: Negative for chest pain, palpitations, orthopnea and PND.  Gastrointestinal: Negative for diarrhea, nausea and vomiting.  Genitourinary: Negative for frequency and urgency.  Musculoskeletal: Negative for back pain and joint pain.  Neurological: Positive for weakness. Negative for sensory change, speech change and focal weakness.  Psychiatric/Behavioral: Negative for depression and hallucinations. The patient is not nervous/anxious.    Tolerating Diet:poor Tolerating PT: SNF DRUG ALLERGIES:  No Known Allergies  VITALS:  Blood pressure 130/74, pulse 72, temperature 98.4 F (36.9 C), temperature source Oral, resp. rate 20, height 5\' 4"  (1.626 m), weight 35.5 kg, SpO2 99 %. PHYSICAL EXAMINATION:   Physical Exam  GENERAL:  59 y.o.-year-old patient lying in the bed with no acute distress. Thin cachectic debilitated chronically  ill appearing EYES: Pupils equal, round, reactive to light and accommodation. No scleral icterus.  Pallor+ HEENT: Head atraumatic, normocephalic. Oropharynx and nasopharynx clear.  NECK:  Supple, no jugular venous distention. No thyroid enlargement, no tenderness.  LUNGS: Normal breath sounds  bilaterally, no wheezing, rales, rhonchi. No use of accessory muscles of respiration. PORT + CARDIOVASCULAR: S1, S2 normal. No murmurs, rubs, or gallops.  ABDOMEN: Soft, +tender right Upper quadrant, nondistended. Bowel sounds present. No organomegaly or mass.  EXTREMITIES: No cyanosis, clubbing or edema b/l.    NEUROLOGIC: Cranial nerves II through XII are intact. No focal Motor or sensory deficits b/l.very weak and deconditioned   PSYCHIATRIC:  patient is alert and oriented SKIN: No obvious rash, lesion, or ulcer.   LABORATORY PANEL:  CBC Recent Labs  Lab 07/10/19 0540  WBC 13.2*  HGB 6.8*  HCT 21.5*  PLT 39*    Chemistries  Recent Labs  Lab 07/09/19 0525 07/09/19 0525 07/10/19 0540  NA 137   < > 134*  K 3.4*   < > 3.9  CL 101   < > 102  CO2 27   < > 25  GLUCOSE 95   < > 68*  BUN 12   < > 14  CREATININE 0.67   < > 0.81  CALCIUM 7.3*   < > 7.1*  MG 1.4*   < > 2.2  AST 15  --   --   ALT 21  --   --   ALKPHOS 150*  --   --   BILITOT 1.3*  --   --    < > = values in this interval not displayed.   Cardiac Enzymes No results for input(s): TROPONINI in the last 168 hours. RADIOLOGY:  No results found. ASSESSMENT AND PLAN:  Nancy Clark is a 59 y.o. female with medical history significant of hypertension, depression, metastasized colon cancer on chemotherapy, peripheral neuropathy due to chemotherapy, thrombocytopenia, GI bleeding, biliary obstruction, who presents with  abdominal pain and fever. Patient states that she has been worsening abdominal pain in the past several days, which is located in the central abdomen, constant, moderate, sharp, nonradiating.  Sepsis due to Large liver abscess and Enterococcus bacteremia: - Patient meets criteria sepsis with fever, tachycardia, elevated lactic acid up to 8.8, which is normalized with IV fluid currently.  -Blood culture is 4 of 4 positive: 2 of 4 GPC and 4 of 4 GNR. BCID: Klebsiella oxytoca, Enterobacter cloacae complex,  and Enterococcus species. -Fluid culture Enterococcus fecalis and cloacae -Dr. 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 -Patient has very poor performance status. Spoke with Dr. Rogue Bussing and Merrily Pew borders today  Hypernatremia /Hypokalemia: K= 2.8 ->3.9  -Pharmacy consult placed -sodium 152-->146->137->134 -K and phosphorus being repleted  Thrombocytopenia ([place]): Likely due to chemotherapy.  No active bleeding  -Platelets 39 today.  Hold Lovenox  Normocytic anemia: Hemoglobin dropped to 6.8, will order 1 PRBC.  Spouse [name] has consented for transfusion  Essential hypertension: - cont clonidine and amlodipine  Nutrition Status: Nutrition Problem: Severe Malnutrition Etiology: chronic illness(stage IV colon cancer) Signs/Symptoms: percent weight loss, severe fat depletion, severe muscle depletion Percent weight loss: 20.6 % Interventions: Refer to RD note for recommendations  Pt at risk for re-feeding syndrome  Overall poor prognosis.  Appreciate Palliative care input  Procedures: IR drained liver abcess Family communication : I had discussion with patient's spouse [name] and patient's mother over phone today Consults :ID, oncology, palliative care Discharge Disposition : SNF with palliative care CODE STATUS: DNR DVT Prophylaxis :lovneonx on hold due to thrombocytopenia Barriers to discharge: Family would like her to be placed at the SNF if possible.  TOC team aware and working on it.  Overall patient would be  appropriate for hospice but family still seem to be in denial.  TOTAL TIME TAKING CARE OF THIS PATIENT: *35* minutes.  >50% time spent on counselling and coordination of care  Note: This dictation was prepared with Dragon dictation along with smaller phrase technology. Any transcriptional errors that result from this process are unintentional.  Max Sane M.D    [facility]   CC: Primary care physician; Patient, No Pcp PerPatient ID: [name], female   DOB: 03/07/1961, 59 y.o.   MRN: [id number]

## 2019-07-11 ENCOUNTER — Inpatient Hospital Stay: Payer: BC Managed Care – PPO

## 2019-07-11 DIAGNOSIS — Z95828 Presence of other vascular implants and grafts: Secondary | ICD-10-CM

## 2019-07-11 DIAGNOSIS — Z978 Presence of other specified devices: Secondary | ICD-10-CM

## 2019-07-11 LAB — TYPE AND SCREEN
ABO/RH(D): O POS
Antibody Screen: NEGATIVE
Unit division: 0

## 2019-07-11 LAB — BASIC METABOLIC PANEL
Anion gap: 12 (ref 5–15)
BUN: 9 mg/dL (ref 6–20)
CO2: 21 mmol/L — ABNORMAL LOW (ref 22–32)
Calcium: 7.7 mg/dL — ABNORMAL LOW (ref 8.9–10.3)
Chloride: 104 mmol/L (ref 98–111)
Creatinine, Ser: 0.79 mg/dL (ref 0.44–1.00)
GFR calc Af Amer: >60 mL/min (ref >60)
GFR calc non Af Amer: >60 mL/min (ref >60)
Glucose, Bld: 73 mg/dL (ref 70–99)
Potassium: 3.5 mmol/L (ref 3.5–5.1)
Sodium: 137 mmol/L (ref 135–145)

## 2019-07-11 LAB — CBC
HCT: 29.2 % — ABNORMAL LOW (ref 36.0–46.0)
Hemoglobin: 9.6 g/dL — ABNORMAL LOW (ref 12.0–15.0)
MCH: 26.7 pg (ref 26.0–34.0)
MCHC: 32.9 g/dL (ref 30.0–36.0)
MCV: 81.1 fL (ref 80.0–100.0)
Platelets: 75 10*3/uL — ABNORMAL LOW (ref 150–400)
RBC: 3.6 MIL/uL — ABNORMAL LOW (ref 3.87–5.11)
RDW: 19.4 % — ABNORMAL HIGH (ref 11.5–15.5)
WBC: 14.5 10*3/uL — ABNORMAL HIGH (ref 4.0–10.5)
nRBC: 0 % (ref 0.0–0.2)

## 2019-07-11 LAB — GLUCOSE, CAPILLARY
Glucose-Capillary: 169 mg/dL — ABNORMAL HIGH (ref 70–99)
Glucose-Capillary: 68 mg/dL — ABNORMAL LOW (ref 70–99)

## 2019-07-11 LAB — PHOSPHORUS: Phosphorus: 3.4 mg/dL (ref 2.5–4.6)

## 2019-07-11 LAB — MAGNESIUM: Magnesium: 1.7 mg/dL (ref 1.7–2.4)

## 2019-07-11 LAB — BPAM RBC
Blood Product Expiration Date: 202103172359
ISSUE DATE / TIME: 202102181119
Unit Type and Rh: 5100

## 2019-07-11 LAB — HEPARIN INDUCED PLATELET AB (HIT ANTIBODY): Heparin Induced Plt Ab: 0.246 OD (ref 0.000–0.400)

## 2019-07-11 MED ORDER — DEXTROSE 50 % IV SOLN
INTRAVENOUS | Status: AC
  Administered 2019-07-11: 12.5 g via INTRAVENOUS
  Filled 2019-07-11: qty 50

## 2019-07-11 MED ORDER — DEXTROSE 50 % IV SOLN: 12.5000 g | INTRAVENOUS | Status: AC

## 2019-07-11 MED ORDER — MAGNESIUM SULFATE 2 GM/50ML IV SOLN
2.0000 g | Freq: Once | INTRAVENOUS | Status: AC
  Administered 2019-07-11: 2 g via INTRAVENOUS
  Filled 2019-07-11: qty 50

## 2019-07-11 NOTE — Progress Notes (Signed)
[facility] at [place] NAME: Nancy Clark    MR#:  [id number]  DATE OF BIRTH:  [birth year]  SUBJECTIVE:  patient came in with ongoing abdominal pain was found to have liver abscess.  S/p drain. No fever. Some serum leak at biliary drain site. Dressing soaked. Patient not eating much, last BM on 2/13 reported per RN REVIEW OF SYSTEMS:   Review of Systems  Constitutional: Positive for malaise/fatigue and weight loss. Negative for chills and fever.  HENT: Negative for ear discharge, ear pain and nosebleeds.   Eyes: Negative for blurred vision, pain and discharge.  Respiratory: Negative for sputum production, shortness of breath, wheezing and stridor.   Cardiovascular: Negative for chest pain, palpitations, orthopnea and PND.  Gastrointestinal: Positive for constipation. Negative for diarrhea, nausea and vomiting.  Genitourinary: Negative for frequency and urgency.  Musculoskeletal: Negative for back pain and joint pain.  Neurological: Positive for weakness. Negative for sensory change, speech change and focal weakness.  Psychiatric/Behavioral: Negative for depression and hallucinations. The patient is not nervous/anxious.    Tolerating Diet:poor Tolerating PT: SNF DRUG ALLERGIES:  No Known Allergies  VITALS:  Blood pressure (!) 144/84, pulse 84, temperature 98 F (36.7 C), temperature source Oral, resp. rate 16, height 5\' 4"  (1.626 m), weight 35.5 kg, SpO2 98 %. PHYSICAL EXAMINATION:   Physical Exam  GENERAL:  59 y.o.-year-old patient lying in the bed with no acute distress. Thin cachectic debilitated chronically  ill appearing EYES: Pupils equal, round, reactive to light and accommodation. No scleral icterus.  Pallor+ HEENT: Head atraumatic, normocephalic. Oropharynx and nasopharynx clear.  NECK:  Supple, no jugular venous distention. No thyroid enlargement, no tenderness.  LUNGS: Normal breath sounds bilaterally, no wheezing, rales,  rhonchi. No use of accessory muscles of respiration. PORT + CARDIOVASCULAR: S1, S2 normal. No murmurs, rubs, or gallops.  ABDOMEN: Soft, +tender right Upper quadrant, nondistended. Bowel sounds present. No organomegaly or mass.  EXTREMITIES: No cyanosis, clubbing or edema b/l.    NEUROLOGIC: Cranial nerves II through XII are intact. No focal Motor or sensory deficits b/l.very weak and deconditioned   PSYCHIATRIC:  patient is alert and oriented SKIN: No obvious rash, lesion, or ulcer.   LABORATORY PANEL:  CBC Recent Labs  Lab 07/11/19 0442  WBC 14.5*  HGB 9.6*  HCT 29.2*  PLT 75*    Chemistries  Recent Labs  Lab 07/10/19 0540 07/10/19 1543 07/11/19 0442  NA   < >  --  137  K   < >  --  3.5  CL   < >  --  104  CO2   < >  --  21*  GLUCOSE   < >  --  73  BUN   < >  --  9  CREATININE   < >  --  0.79  CALCIUM   < >  --  7.7*  MG   < >  --  1.7  AST  --  12*  --   ALT  --  16  --   ALKPHOS  --  169*  --   BILITOT  --  1.7*  --    < > = values in this interval not displayed.   Cardiac Enzymes No results for input(s): TROPONINI in the last 168 hours. RADIOLOGY:  US Abdomen Limited RUQ  Result Date: 07/11/2019 CLINICAL DATA:  Liver abscess. EXAM: ULTRASOUND ABDOMEN LIMITED RIGHT UPPER QUADRANT COMPARISON:  CT 07/06/2019 and 07/05/2019. FINDINGS:  Gallbladder: Sludge noted. Tiny gallstones noted. Gallbladder wall thickness 3.8 mm. Negative Murphy sign. Common bile duct: Diameter: 8.2 mm. Biliary stent noted. Debris noted within the lumen of the stent/common bile duct. Liver: Right hepatic lobe abscess noted, decreased in size from prior exam measuring 4.2 cm in maximum diameter. Drainage catheter present. Hepatic abscess and drainage catheter would be best evaluated by follow-up CT. Previously identified hepatic lesions suggesting metastatic disease best identified by prior CT. Portal vein is patent on color Doppler imaging with normal direction of blood flow towards the liver.  Other: None. IMPRESSION: 1. Gallbladder sludge noted. Tiny gallstones noted. Gallbladder wall is thickened at 3.8 mm. Cholecystitis cannot be completely excluded. Negative Murphy sign. 2. Biliary stent noted within the common bile duct. Debris noted within the lumen of the biliary stent. 3. Right hepatic abscess noted, decreased in size from prior exam measuring 4.2 cm in maximum diameter. Drainage catheter is present. Hepatic abscess and drainage catheter would be best re-evaluated by follow-up CT. Previously identified hepatic lesions suggesting metastatic disease best identified by prior CT. Electronically Signed   By: Marcello Moores  Register   On: 07/11/2019 07:58   ASSESSMENT AND PLAN:  Nancy Clark is a 59 y.o. female with medical history significant of hypertension, depression, metastasized colon cancer on chemotherapy, peripheral neuropathy due to chemotherapy, thrombocytopenia, GI bleeding, biliary obstruction, who presents with abdominal pain and fever. Patient states that she has been worsening abdominal pain in the past several days, which is located in the central abdomen, constant, moderate, sharp, nonradiating.  Sepsis due to Large liver abscess and Enterococcus bacteremia: - Patient meets criteria sepsis with fever, tachycardia, elevated lactic acid up to 8.8, which is normalized with IV fluid currently.  -Blood culture is 4 of 4 positive: 2 of 4 GPC and 4 of 4 GNR. BCID: Klebsiella oxytoca, Enterobacter cloacae complex, and Enterococcus species. -Fluid culture Enterococcus fecalis and cloacae -Dr. 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 -Patient has very poor performance status.   Hypernatremia /Hypokalemia: K= 2.8 ->3.9  -Pharmacy consult placed -sodium 152-->146->137->134 -K and phosphorus being repleted  Thrombocytopenia ([place]): Likely due to chemotherapy.  No active bleeding  -Platelets 75 today.  Hold Lovenox  Normocytic anemia: Hemoglobin 9.6 s/p 1 PRBC transfusion on 2/18.   Essential hypertension: - cont clonidine and amlodipine  Nutrition Status: Nutrition Problem: Severe Malnutrition Etiology: chronic illness(stage IV colon cancer) Signs/Symptoms: percent weight loss, severe fat depletion, severe muscle depletion Percent weight loss: 20.6 % Interventions: Refer to RD note for recommendations  Pt at risk for re-feeding syndrome  Overall poor prognosis. Appreciate Palliative care input  Procedures: IR drained liver abcess Family communication : I had discussion with patient's spouse [name] and patient's mother over phone today 2/19 Consults :ID, oncology, palliative care Discharge Disposition : SNF [facility]) with palliative care tomorrow CODE STATUS: DNR DVT Prophylaxis :lovneonx on hold due to thrombocytopenia Barriers to discharge: none  TOTAL TIME TAKING CARE OF THIS PATIENT: *35* minutes.  >50% time spent on counselling and coordination of care  Note: This dictation was prepared with Dragon dictation along with smaller phrase technology. Any transcriptional errors that result from this process are unintentional.  Max Sane M.D    [facility]   CC: Primary care physician; Patient, No Pcp PerPatient ID: [name], female   DOB: [birth year], 60 y.o.   MRN: [id number]

## 2019-07-11 NOTE — Plan of Care (Signed)
Continuing with plan of care. 

## 2019-07-11 NOTE — Progress Notes (Signed)
Physical Therapy Treatment Patient Details Name: Nancy Clark MRN: [id number] DOB: Aug 31, 1960 Today's Date: 07/11/2019    History of Present Illness Pt admitted for liver abscess with mets with complaints of abdominal pain. History includes HTN, depression, colon ca on chemo, neuropathy, and GI bleed    PT Comments    Pt agreeable to bed exercises, did not want to get out bed at this time. Pt performed supine exercises with verbal/tactile cues, no physical assist needed. Pt repositioned for comfort, blinds open, and TV turned on at patient request. Pt with all needs in reach at end of session. The patient would benefit from further skilled PT intervention to continue to progress towards goals as able.     Follow Up Recommendations  Home health PT;Supervision/Assistance - 24 hour     Equipment Recommendations  Rolling walker with 5" wheels    Recommendations for Other Services       Precautions / Restrictions Precautions Precautions: Fall Restrictions Weight Bearing Restrictions: No    Mobility  Bed Mobility               General bed mobility comments: deferred pt did not want to get out of bed  Transfers                    Ambulation/Gait                 Stairs             Wheelchair Mobility    Modified Rankin (Stroke Patients Only)       Balance                                            Cognition Arousal/Alertness: Awake/alert Behavior During Therapy: WFL for tasks assessed/performed Overall Cognitive Status: Impaired/Different from baseline                                 General Comments: oriented to self      Exercises General Exercises - Lower Extremity Ankle Circles/Pumps: AROM;Strengthening;Both;10 reps Quad Sets: AROM;Strengthening;Both;10 reps Short Arc Quad: AROM;Strengthening;Both;10 reps Heel Slides: AROM;Strengthening;Both;10 reps    General Comments         Pertinent Vitals/Pain Pain Assessment: Faces Faces Pain Scale: Hurts a little bit Pain Location: abdomen Pain Descriptors / Indicators: Grimacing Pain Intervention(s): Limited activity within patient's tolerance;Repositioned    Home Living                      Prior Function            PT Goals (current goals can now be found in the care plan section) Progress towards PT goals: Progressing toward goals    Frequency    Min 2X/week      PT Plan Current plan remains appropriate    Co-evaluation              AM-PAC PT "6 Clicks" Mobility   Outcome Measure  Help needed turning from your back to your side while in a flat bed without using bedrails?: A Little Help needed moving from lying on your back to sitting on the side of a flat bed without using bedrails?: A Little Help needed moving to and from a bed to a chair (including a wheelchair)?: A  Little Help needed standing up from a chair using your arms (e.g., wheelchair or bedside chair)?: A Little Help needed to walk in hospital room?: A Little Help needed climbing 3-5 steps with a railing? : A Little 6 Click Score: 18    End of Session   Activity Tolerance: Patient tolerated treatment well Patient left: in bed;with call bell/phone within reach Nurse Communication: Mobility status PT Visit Diagnosis: Muscle weakness (generalized) (M62.81);Difficulty in walking, not elsewhere classified (R26.2);Unsteadiness on feet (R26.81)     Time: [id number] PT Time Calculation (min) (ACUTE ONLY): 15 min  Charges:  $Therapeutic Exercise: 8-22 mins                     Lieutenant Diego PT, DPT 10:55 AM,07/11/19

## 2019-07-11 NOTE — Progress Notes (Signed)
CBG was 68 and hypoglycemic protocol initiated.  Patient tolerated dextrose bolus and CBG back up to 168.

## 2019-07-11 NOTE — TOC Progression Note (Signed)
Transition of Care [facility]) - Progression Note    Patient Details  Name: SHAMONI MEARS MRN: [id number] Date of Birth: Feb 16, 1961  [facility] CM/SW Contact  Beverly Sessions, RN Phone Number: 07/11/2019, 10:18 AM  Clinical Narrative:     PASRR obtained Fl2 completed Bed search initiated  RNCM called husband to present bed offer.  Voicemail full Left voicemail for patient's mother.  Awaiting return call   Expected Discharge Plan: Home/Self Care Barriers to Discharge: Continued Medical Work up  Expected Discharge Plan and Services Expected Discharge Plan: Home/Self Care   Discharge Planning Services: CM Consult   Living arrangements for the past 2 months: Single Family Home                                       Social Determinants of Health (SDOH) Interventions    Readmission Risk Interventions Readmission Risk Prevention Plan 07/08/2019 05/06/2019  Transportation Screening Complete Complete  HRI or [place] - Complete  Social Work Consult for [place] Planning/Counseling - Complete  Palliative Care Screening - Not Applicable  Medication Review [employer] photographer) Complete Complete  HRI or Home Care Consult Complete -  Palliative Care Screening Complete -  Some recent data might be hidden

## 2019-07-11 NOTE — Consult Note (Signed)
Pharmacy Antibiotic Note  Nancy Clark is a 59 y.o. female admitted on 07/05/2019 with polymicrobial bacteremia.Pharmacy was consulted for meropenem dosing.  IR was previously consulted for drainage, now s/p drainage. ID is now changing Zosyn to meropenem due to thrombocytopenia and discontinued vancomycin. She will need IV antibiotics through port for 2-4 weeks per ID 2/18 Plt 39 2/19 Plt 75  Plan:  meropenem 1 gram IV every 12 hours  Height: 5\' 4"  (162.6 cm) Weight: 78 lb 4.2 oz (35.5 kg) IBW/kg (Calculated) : 54.7  Temp (24hrs), Avg:98.3 F (36.8 C), Min:98 F (36.7 C), Max:98.5 F (36.9 C)  Recent Labs  Lab 07/05/19 2034 07/05/19 2034 07/05/19 2248 07/06/19 0220 07/06/19 0620 07/06/19 1702 07/07/19 0356 07/08/19 0321 07/09/19 0525 07/10/19 0540 07/11/19 0442  WBC 6.2  --   --   --   --   --   --   --   --  13.2* 14.5*  CREATININE 1.06*   < >  --   --   --   --  0.88 0.73 0.67 0.81 0.79  LATICACIDVEN 8.9*  --  8.8* 2.7* 1.8 1.1  --   --   --   --   --    < > = values in this interval not displayed.    Estimated Creatinine Clearance: 44 mL/min (by C-G formula based on SCr of 0.79 mg/dL).    No Known Allergies  Antimicrobials this admission: 2/13 cefepime x1 2/13 metronidazole >> 2/14 Zosyn 2/14 >> 2/18 vancomycin 2/14 >> 2/15  2/17>>2/18 meropenem 2/18 >>  Microbiology results: 2/17 Bcx: NGTD 2/14 MRSA PCR: negative 2/13 Bcx: Enterobacter clocae, E faecalis, K oxytoca 2/14 liver abscess: E cloacae, S anginosis, E faecalis  Thank you for allowing pharmacy to be a part of this patient's care.  Abbe Bula A 07/11/2019 8:21 AM

## 2019-07-11 NOTE — Progress Notes (Signed)
ID Pt more alert Weak  Patient Vitals for the past 24 hrs:  BP Temp Temp src Pulse Resp SpO2  07/11/19 1440 (!) 144/84 98 F (36.7 C) Oral 84 -- 98 %  07/11/19 0500 136/70 98 F (36.7 C) Oral 68 16 98 %  07/10/19 2237 (!) 142/66 98.3 F (36.8 C) Oral 73 18 100 %   Rt upper quandrant drain Chest b/l air entry [id number] PORT  Labs CBC Latest Ref Rng & Units 07/11/2019 07/10/2019 07/05/2019  WBC 4.0 - 10.5 K/uL 14.5(H) 13.2(H) 6.2  Hemoglobin 12.0 - 15.0 g/dL 9.6(L) 6.8(L) 10.5(L)  Hematocrit 36.0 - 46.0 % 29.2(L) 21.5(L) 34.4(L)  Platelets 150 - 400 K/uL 75(L) 39(L) 81(L)    CMP Latest Ref Rng & Units 07/11/2019 07/10/2019 07/09/2019  Glucose 70 - 99 mg/dL 73 68(L) 95  BUN 6 - 20 mg/dL 9 14 12   Creatinine 0.44 - 1.00 mg/dL 0.79 0.81 0.67  Sodium 135 - 145 mmol/L 137 134(L) 137  Potassium 3.5 - 5.1 mmol/L 3.5 3.9 3.4(L)  Chloride 98 - 111 mmol/L 104 102 101  CO2 22 - 32 mmol/L 21(L) 25 27  Calcium 8.9 - 10.3 mg/dL 7.7(L) 7.1(L) 7.3(L)  Total Protein 6.5 - 8.1 g/dL - 5.5(L) 4.4(L)  Total Bilirubin 0.3 - 1.2 mg/dL - 1.7(H) 1.3(H)  Alkaline Phos 38 - 126 U/L - 169(H) 150(H)  AST 15 - 41 U/L - 12(L) 15  ALT 0 - 44 U/L - 16 21   Bc 2/13 -efecalis, enterobacter cloace, klebsiella 2/17 NG 2/14 liver abscess culture-enterococcus, enterobacter, strep anginosus  Impression/Recommendation Polymicrobial bacteremia- ( enterococcus, klebsiella, enterobacter cloacae) due to liver  abscess Less likely PORT Pt has CBD stent placed in Dec 2020  Was on zosyn and it was changed to meropenem on 07/10/19 due to thrombocytopenia If HIT antibody positive then the antibiotic can be switched back to zosyn- if HIT negative then will have to continue with meropenem 1 gram IV q 12 until 08/02/19 OPAT Orders Discharge antibiotics: Meropenem 1 gram IV q 12  End Date: 08/02/19  PORT Care Per Protocol:  Labs weekly while on IV antibiotics: _X_ CBC with differential  _X_ CMP    Please leave  PORT  in place   Fax weekly labs to Dr.Jamye Balicki [phone]   Metastatic colon carcinoma- followed by Dr.Brahmandy- last checkpoint inhibitor therapy in Jan 2021  Liver mets, CBD obstruction- s/p stent  Vertebral met- s/p kyphoplasty   Anemia- received PRBC   Thrombocytopenia- improving  ID wlll follow her peripherally this weekend Call if needed

## 2019-07-11 NOTE — TOC Progression Note (Signed)
Transition of Care [facility]) - Progression Note    Patient Details  Name: [name] MRN: [id number] Date of Birth: 12/15/1960  Transition of Care [facility]) CM/SW Contact  Beverly Sessions, RN Phone Number: 07/11/2019, 1:14 PM  Clinical Narrative:    Bed offer presented husband and mother [facility] accepted Claiborne Billings at [facility] to start insurance Josem Kaufmann  Requested MD to order covid test     Expected Discharge Plan: Home/Self Care Barriers to Discharge: Continued Medical Work up  Expected Discharge Plan and Services Expected Discharge Plan: Home/Self Care   Discharge Planning Services: CM Consult   Living arrangements for the past 2 months: Single Family Home                                       Social Determinants of Health (SDOH) Interventions    Readmission Risk Interventions Readmission Risk Prevention Plan 07/08/2019 05/06/2019  Transportation Screening Complete Complete  HRI or [place] - Complete  Social Work Consult for [place] Planning/Counseling - Complete  Palliative Care Screening - Not Applicable  Medication Review Press photographer) Complete Complete  HRI or Home Care Consult Complete -  Palliative Care Screening Complete -  Some recent data might be hidden

## 2019-07-11 NOTE — NC FL2 (Signed)
[place] LEVEL OF CARE SCREENING TOOL     IDENTIFICATION  Patient Name: Nancy Clark Birthdate: [birth year] Sex: female Admission Date (Current Location): 07/05/2019  [place] and [place] Number:  Engineering geologist and Address:  [facility], [address], [place], [place] [address]      Provider Number: [id number]  Attending Physician Name and Address:  Max Sane, MD  Relative Name and Phone Number:  Arthur [place], [phone]    Current Level of Care: Hospital Recommended Level of Care: [place] Prior Approval Number:    Date Approved/Denied:   PASRR Number: [id number]  Discharge Plan: SNF    Current Diagnoses: Patient Active Problem List   Diagnosis Date Noted  . Protein-calorie malnutrition, severe 07/07/2019  . Palliative care encounter   . Enterococcal sepsis ([employer])   . Sepsis ([place]) 07/06/2019  . Liver abscess 07/06/2019  . Elevated troponin 07/06/2019  . Bacteremia 07/06/2019  . Biliary obstruction 05/03/2019  . Metastases to the liver ([place]) 05/03/2019  . History of colon cancer 05/03/2019  . Hyperbilirubinemia 05/03/2019  . Elevated liver enzymes 05/03/2019  . Thrombocytopenia ([place]) 05/03/2019  . Normocytic anemia 05/03/2019  . Essential hypertension 05/03/2019  . Liver mass, right lobe 11/06/2018  . Goals of care, counseling/discussion 10/19/2018  . Cancer, metastatic to bone ([place]) 07/31/2016  . Encounter for antineoplastic chemotherapy 12/07/2015  . Malnutrition of moderate degree 10/13/2015  . Gastrointestinal bleeding 10/12/2015  . Pancytopenia due to chemotherapy ([place]) 10/12/2015  . Hypokalemia 10/12/2015  . Cancer of ascending colon metastatic to intra-abdominal lymph node ([place]) 09/06/2015  . Preventative health care 06/08/2015    Orientation RESPIRATION BLADDER Height & Weight     Self, Place  Normal Incontinent Weight: 78 lb 4.2 oz (35.5 kg) Height:  5\' 4"  (162.6 cm)   BEHAVIORAL SYMPTOMS/MOOD NEUROLOGICAL BOWEL NUTRITION STATUS      Continent Diet(full liquid, thin liquids)  AMBULATORY STATUS COMMUNICATION OF NEEDS Skin   Limited Assist Verbally Surgical wounds                       Personal Care Assistance Level of Assistance              Functional Limitations Info             SPECIAL CARE FACTORS FREQUENCY  PT (By licensed PT)                    Contractures Contractures Info: Not present    Additional Factors Info  Code Status, Allergies Code Status Info: DNR Allergies Info: NKDA           Current Medications (07/11/2019):  This is the current hospital active medication list Current Facility-Administered Medications  Medication Dose Route Frequency Provider Last Rate Last Admin  . 0.9 %  sodium chloride infusion   Intravenous Continuous Max Sane, MD 75 mL/hr at 07/11/19 0446 New Bag at 07/11/19 0446  . acetaminophen (TYLENOL) tablet 650 mg  650 mg Oral Q6H PRN Fritzi Mandes, MD      . amLODipine (NORVASC) tablet 10 mg  10 mg Oral Daily Fritzi Mandes, MD   10 mg at 07/10/19 1018  . calcium-vitamin D (OSCAL WITH D) 500-200 MG-UNIT per tablet 1 tablet  1 tablet Oral q1800 Charlett Nose, RPH   1 tablet at 07/07/19 1706  . Chlorhexidine Gluconate Cloth 2 % PADS 6 each  6 each Topical Daily Ivor Costa, MD  6 each at 07/11/19 0832  . cloNIDine (CATAPRES - Dosed in mg/24 hr) patch 0.1 mg  0.1 mg Transdermal Q Mon-1800 Fritzi Mandes, MD   0.1 mg at 07/07/19 1707  . diphenoxylate-atropine (LOMOTIL) 2.5-0.025 MG per tablet 1 tablet  1 tablet Oral QID PRN Ivor Costa, MD      . DULoxetine (CYMBALTA) DR capsule 40 mg  40 mg Oral Daily Ivor Costa, MD   40 mg at 07/09/19 1150  . feeding supplement (BOOST / RESOURCE BREEZE) liquid 1 Container  1 Container Oral TID BM Fritzi Mandes, MD   1 Container at 07/09/19 1630  . gabapentin (NEURONTIN) capsule 300 mg  300 mg Oral Daily Charlett Nose, RPH   300 mg at 07/10/19 1018  .  gabapentin (NEURONTIN) capsule 600 mg  600 mg Oral QHS Charlett Nose, RPH   600 mg at 07/09/19 2128  . ibuprofen (ADVIL) tablet 200 mg  200 mg Oral Q6H PRN Ivor Costa, MD      . magic mouthwash  10 mL Oral QID Fritzi Mandes, MD   10 mL at 07/10/19 1329   And  . lidocaine (XYLOCAINE) 2 % viscous mouth solution 10 mL  10 mL Mouth/Throat QID Fritzi Mandes, MD   10 mL at 07/10/19 1329  . loperamide (IMODIUM) capsule 2 mg  2 mg Oral QID PRN Ivor Costa, MD      . menthol-cetylpyridinium (CEPACOL) lozenge 3 mg  1 lozenge Oral PRN Fritzi Mandes, MD      . meropenem (MERREM) 1 g in sodium chloride 0.9 % 100 mL IVPB  1 g Intravenous Q12H Dallie Piles, RPH 200 mL/hr at 07/11/19 0448 1 g at 07/11/19 0448  . morphine 2 MG/ML injection 1 mg  1 mg Intravenous Q8H PRN Max Sane, MD      . multivitamin with minerals tablet 1 tablet  1 tablet Oral Daily Ivor Costa, MD   1 tablet at 07/10/19 1018  . ondansetron (ZOFRAN) injection 4 mg  4 mg Intravenous Q8H PRN Ivor Costa, MD   4 mg at 07/09/19 0509  . oxyCODONE (Oxy IR/ROXICODONE) immediate release tablet 5 mg  5 mg Oral Q6H PRN Ivor Costa, MD   5 mg at 07/09/19 1156  . pantoprazole (PROTONIX) EC tablet 40 mg  40 mg Oral Q1200 Ivor Costa, MD   40 mg at 07/10/19 1329  . polyethylene glycol (MIRALAX / GLYCOLAX) packet 17 g  17 g Oral Daily PRN Dallie Piles, RPH      . sodium chloride flush (NS) 0.9 % injection 5 mL  5 mL Intracatheter Q8H Aletta Edouard, MD   5 mL at 07/11/19 0449   Facility-Administered Medications Ordered in Other Encounters  Medication Dose Route Frequency Provider Last Rate Last Admin  . sodium chloride flush (NS) 0.9 % injection 10 mL  10 mL Intravenous PRN Cammie Sickle, MD   10 mL at 10/04/15 0901  . sodium chloride flush (NS) 0.9 % injection 10 mL  10 mL Intravenous PRN Cammie Sickle, MD   10 mL at 07/02/17 1020     Discharge Medications: Please see discharge summary for a list of discharge medications.  Relevant  Imaging Results:  Relevant Lab Results:   Additional Information not a candidate for chemo; needs IV antibitoics; SS #[id number]  [place], LCSW

## 2019-07-11 NOTE — Progress Notes (Signed)
[place] for Electrolyte Monitoring and Replacement   Recent Labs: Potassium (mmol/L)  Date Value  07/11/2019 3.5   Magnesium (mg/dL)  Date Value  07/11/2019 1.7   Calcium (mg/dL)  Date Value  07/11/2019 7.7 (L)   Albumin (g/dL)  Date Value  07/10/2019 2.0 (L)   Phosphorus (mg/dL)  Date Value  07/11/2019 3.4   Sodium (mmol/L)  Date Value  07/11/2019 137   Corrected Ca: 9.02 mg/dL  Assessment: 59 year old female admitted with weakness and abdominal pain. Patient with positive blood cultures and hepatic abscess s/p drain placement. Patient with electrolyte abnormalities on presentation. Patient at refeeding risk. Pharmacy consulted to manage electrolytes. Over the previous 24 hours the patient received approximately 60 mEq IV potassium and 20 mmol IV phosphorous. Fluids were changed on 2/16from D520 at 100 mL/hr to 0.45%NS w. 20 mEq KCl/L at 75 mL/hr  Goal of Therapy:  Electrolytes WNL  Plan: . K 3.5  Mag 1.7  Phos 3.4  Scr 0.79  Na 137   Will order Magnesium sulfate 2 gram IV x 1  Next electrolytes in am   Jasmin Winberry A,  07/11/2019 8:24 AM

## 2019-07-12 LAB — PHOSPHORUS: Phosphorus: 2.5 mg/dL (ref 2.5–4.6)

## 2019-07-12 LAB — BASIC METABOLIC PANEL
Anion gap: 11 (ref 5–15)
BUN: 8 mg/dL (ref 6–20)
CO2: 24 mmol/L (ref 22–32)
Calcium: 7.9 mg/dL — ABNORMAL LOW (ref 8.9–10.3)
Chloride: 106 mmol/L (ref 98–111)
Creatinine, Ser: 0.7 mg/dL (ref 0.44–1.00)
GFR calc Af Amer: >60 mL/min (ref >60)
GFR calc non Af Amer: >60 mL/min (ref >60)
Glucose, Bld: 90 mg/dL (ref 70–99)
Potassium: 3.4 mmol/L — ABNORMAL LOW (ref 3.5–5.1)
Sodium: 141 mmol/L (ref 135–145)

## 2019-07-12 LAB — SARS CORONAVIRUS 2 (TAT 6-24 HRS): SARS Coronavirus 2: NEGATIVE

## 2019-07-12 LAB — GLUCOSE, CAPILLARY: Glucose-Capillary: 85 mg/dL (ref 70–99)

## 2019-07-12 LAB — MAGNESIUM: Magnesium: 2 mg/dL (ref 1.7–2.4)

## 2019-07-12 MED ORDER — POTASSIUM CHLORIDE CRYS ER 10 MEQ PO TBCR: 20.0000 meq | EXTENDED_RELEASE_TABLET | Freq: Once | ORAL | Status: DC

## 2019-07-12 MED ORDER — MEROPENEM IV (FOR PTA / DISCHARGE USE ONLY): 1.0000 g | Freq: Two times a day (BID) | INTRAVENOUS | 0 refills | Status: AC

## 2019-07-12 MED ORDER — SENNOSIDES-DOCUSATE SODIUM 8.6-50 MG PO TABS: 2.0000 | ORAL_TABLET | ORAL | Status: DC

## 2019-07-12 MED ORDER — HEPARIN SOD (PORK) LOCK FLUSH 100 UNIT/ML IV SOLN
500.0000 [IU] | Freq: Once | INTRAVENOUS | Status: AC
  Administered 2019-07-12: 500 [IU] via INTRAVENOUS
  Filled 2019-07-12 (×2): qty 5

## 2019-07-12 MED ORDER — OXYCODONE HCL 5 MG PO TABS: 5.0000 mg | ORAL_TABLET | Freq: Four times a day (QID) | ORAL | 0 refills | Status: AC | PRN

## 2019-07-12 MED ORDER — FLEET ENEMA 7-19 GM/118ML RE ENEM: 1.0000 | ENEMA | RECTAL | Status: DC

## 2019-07-12 NOTE — TOC Transition Note (Signed)
Transition of Care [facility]) - CM/SW Discharge Note   Patient Details  Name: Nancy Clark MRN: [id number] Date of Birth: [birth year]  [facility]) CM/SW Contact:  Truitt Merle, LCSW Phone Number: 07/12/2019, 4:47 PM   Clinical Narrative:    Patient ready for discharge today to [facility]). Patient and husband made aware at bedside and agreeable. Confirmed bed with Claiborne Billings in admissions at [facility]. Dr. Atha Starks and RN Claiborne Billings updated. Patient going to room #7 and report to be called to [phone]. FL-2 updated and sent via the hub. Med necessity form, signed DNR, and updated D/C summary on chart. EMS transport arranged. RN updated. No further TOC needs.    Final next level of care: Skilled Nursing Facility Barriers to Discharge: Barriers Resolved   Patient Goals and CMS Choice        Discharge Placement              Patient chooses bed at: [facility] Patient to be transferred to facility by: [facility] Name of family member notified: Fabian Sharp - Husband Patient and family notified of of transfer: 07/12/19  Discharge Plan and Services   Discharge Planning Services: CM Consult                                 Social Determinants of Health ([place]) Interventions     Readmission Risk Interventions Readmission Risk Prevention Plan 07/08/2019 05/06/2019  Transportation Screening Complete Complete  HRI or [place] - Complete  Social Work Consult for [place] Planning/Counseling - Complete  Palliative Care Screening - Not Applicable  Medication Review Press photographer) Complete Complete  HRI or Home Care Consult Complete -  Palliative Care Screening Complete -  Some recent data might be hidden

## 2019-07-12 NOTE — Progress Notes (Signed)
Report given to nurse at [facility]; patient is still awaiting EMS transport.

## 2019-07-12 NOTE — Plan of Care (Signed)
Continuing with plan of care. 

## 2019-07-12 NOTE — Progress Notes (Signed)
PHARMACY CONSULT NOTE FOR:  OUTPATIENT  PARENTERAL ANTIBIOTIC THERAPY (OPAT)  Indication: polymocrobial bacteremia and liver abscess Regimen: Meropenem 1 gm q12h End date: 08/02/2019  IV antibiotic discharge orders are pended. To discharging provider:  please sign these orders via discharge navigator,  Select New Orders & click on the button choice - Manage This Unsigned Work.     Thank you for allowing pharmacy to be a part of this patient's care.  Solomiya Pascale A 07/12/2019, 8:07 AM

## 2019-07-12 NOTE — Progress Notes (Signed)
Patient escorted off the unit on stretcher via EMS. Discharge packet with patient.

## 2019-07-12 NOTE — Discharge Instructions (Signed)
Sepsis, Diagnosis, Adult Sepsis is a serious bodily reaction to an infection. The infection that triggers sepsis may be from a bacteria, virus, or fungus. Sepsis can result from an infection in any part of your body. Infections that commonly lead to sepsis include skin, lung, and urinary tract infections. Sepsis is a medical emergency that must be treated right away in a hospital. In severe cases, it can lead to septic shock. Septic shock can weaken your heart and cause your blood pressure to drop. This can cause your central nervous system and your body's organs to stop working. What are the causes? This condition is caused by a severe reaction to infections from bacteria, viruses, or fungus. The germs that most often lead to sepsis include:  Escherichia coli (E. coli) bacteria.  Staphylococcus aureus (staph) bacteria.  Some types of Streptococcus bacteria. The most common infections affect these organs:  The lung (pneumonia).  The kidneys or bladder (urinary tract infection).  The skin (cellulitis).  The bowel, gallbladder, or pancreas. What increases the risk? You are more likely to develop this condition if:  Your body's disease-fighting system (immune system) is weakened.  You are age 65 or older.  You are female.  You had surgery or you have been hospitalized.  You have these devices inserted into your body: ? A small, thin tube (catheter). ? IV line. ? Breathing tube. ? Drainage tube.  You are not getting enough nutrients from food (malnourished).  You have a long-term (chronic) disease, such as cancer, lung disease, kidney disease, or diabetes.  You are African American. What are the signs or symptoms? Symptoms of this condition may include:  Fever.  Chills or feeling very cold.  Confusion or anxiety.  Fatigue.  Muscle aches.  Shortness of breath.  Nausea and vomiting.  Urinating much less than usual.  Fast heart rate (tachycardia).  Rapid  breathing (hyperventilation).  Changes in skin color. Your skin may look blotchy, pale, or blue.  Cool, clammy, or sweaty skin.  Skin rash. Other symptoms depend on the source of your infection. How is this diagnosed? This condition is diagnosed based on:  Your symptoms.  Your medical history.  A physical exam. Other tests may also be done to find out the cause of the infection and how severe the sepsis is. These tests may include:  Blood tests.  Urine tests.  Swabs from other areas of your body that may have an infection. These samples may be tested (cultured) to find out what type of bacteria is causing the infection.  Chest X-ray to check for pneumonia. Other imaging tests, such as a CT scan, may also be done.  Lumbar puncture. This removes a small amount of the fluid that surrounds your brain and spinal cord. The fluid is then examined for infection. How is this treated? This condition must be treated in a hospital. Based on the cause of your infection, you may be given an antibiotic, antiviral, or antifungal medicine. You may also receive:  Fluids through an IV.  Oxygen and breathing assistance.  Medicines to increase your blood pressure.  Kidney dialysis. This process cleans your blood if your kidneys have failed.  Surgery to remove infected tissue.  Blood transfusion if needed.  Medicine to prevent blood clots.  Nutrients to correct imbalances in basic body function (metabolism). You may: ? Receive important salts and minerals (electrolytes) through an IV. ? Have your blood sugar level adjusted. Follow these instructions at home: Medicines   Take over-the-counter and   prescription medicines only as told by your health care provider.  If you were prescribed an antibiotic, antiviral, or antifungal medicine, take it as told by your health care provider. Do not stop taking the medicine even if you start to feel better. General instructions  If you have a  catheter or other indwelling device, ask to have it removed as soon as possible.  Keep all follow-up visits as told by your health care provider. This is important. Contact a health care provider if:  You do not feel like you are getting better or regaining strength.  You are having trouble coping with your recovery.  You frequently feel tired.  You feel worse or do not seem to get better after surgery.  You think you may have an infection after surgery. Get help right away if:  You have any symptoms of sepsis.  You have difficulty breathing.  You have a rapid or skipping heartbeat.  You become confused or disoriented.  You have a high fever.  Your skin becomes blotchy, pale, or blue.  You have an infection that is getting worse or not getting better. These symptoms may represent a serious problem that is an emergency. Do not wait to see if the symptoms will go away. Get medical help right away. Call your local emergency services (911 in the [place] Do not drive yourself to the hospital. Summary  Sepsis is a medical emergency that requires immediate treatment in a hospital.  This condition is caused by a severe reaction to infections from bacteria, viruses, or fungus.  Based on the cause of your infection, you may be given an antibiotic, antiviral, or antifungal medicine.  Treatment may also include IV fluids, breathing assistance, and kidney dialysis. This information is not intended to replace advice given to you by your health care provider. Make sure you discuss any questions you have with your health care provider. Document Revised: 12/14/2017 Document Reviewed: 12/14/2017 Elsevier Patient Education  2020 Elsevier Inc.  

## 2019-07-12 NOTE — NC FL2 (Signed)
[place] LEVEL OF CARE SCREENING TOOL     IDENTIFICATION  Patient Name: Nancy Clark Birthdate: 1960/09/12 Sex: female Admission Date (Current Location): 07/05/2019  [place] and [place] Number:  Engineering geologist and Address:  [facility], [address] [place], [place] [address]      Provider Number: [id number]  Attending Physician Name and Address:  Max Sane, MD  Relative Name and Phone Number:  Shamyia [place], [phone]    Current Level of Care: Hospital Recommended Level of Care: [place] Prior Approval Number:    Date Approved/Denied:   PASRR Number: [id number]  Discharge Plan: SNF    Current Diagnoses: Patient Active Problem List   Diagnosis Date Noted  . Protein-calorie malnutrition, severe 07/07/2019  . Palliative care encounter   . Enterococcal sepsis ([place])   . Sepsis ([place]) 07/06/2019  . Liver abscess 07/06/2019  . Elevated troponin 07/06/2019  . Bacteremia 07/06/2019  . Biliary obstruction 05/03/2019  . Metastases to the liver ([place]) 05/03/2019  . History of colon cancer 05/03/2019  . Hyperbilirubinemia 05/03/2019  . Elevated liver enzymes 05/03/2019  . Thrombocytopenia ([place]) 05/03/2019  . Normocytic anemia 05/03/2019  . Essential hypertension 05/03/2019  . Liver mass, right lobe 11/06/2018  . Goals of care, counseling/discussion 10/19/2018  . Cancer, metastatic to bone ([place]) 07/31/2016  . Encounter for antineoplastic chemotherapy 12/07/2015  . Malnutrition of moderate degree 10/13/2015  . Gastrointestinal bleeding 10/12/2015  . Pancytopenia due to chemotherapy ([place]) 10/12/2015  . Hypokalemia 10/12/2015  . Cancer of ascending colon metastatic to intra-abdominal lymph node ([place]) 09/06/2015  . Preventative health care 06/08/2015    Orientation RESPIRATION BLADDER Height & Weight     Self, Place  Normal Incontinent Weight: 78 lb 4.2 oz (35.5 kg) Height:  5\' 4"  (162.6 cm)   BEHAVIORAL SYMPTOMS/MOOD NEUROLOGICAL BOWEL NUTRITION STATUS      Continent Diet(full liquid, thin liquids)  AMBULATORY STATUS COMMUNICATION OF NEEDS Skin   Extensive Assist Verbally Surgical wounds                       Personal Care Assistance Level of Assistance  Bathing, Dressing, Feeding           Functional Limitations Info             SPECIAL CARE FACTORS FREQUENCY  PT (By licensed PT)                    Contractures Contractures Info: Not present    Additional Factors Info  Code Status Code Status Info: DNR Allergies Info: NKDA           Current Medications (07/12/2019):  This is the current hospital active medication list Current Facility-Administered Medications  Medication Dose Route Frequency Provider Last Rate Last Admin  . acetaminophen (TYLENOL) tablet 650 mg  650 mg Oral Q6H PRN Fritzi Mandes, MD      . amLODipine (NORVASC) tablet 10 mg  10 mg Oral Daily Fritzi Mandes, MD   10 mg at 07/10/19 1018  . calcium-vitamin D (OSCAL WITH D) 500-200 MG-UNIT per tablet 1 tablet  1 tablet Oral q1800 Charlett Nose, RPH   1 tablet at 07/07/19 1706  . Chlorhexidine Gluconate Cloth 2 % PADS 6 each  6 each Topical Daily Ivor Costa, MD   6 each at 07/12/19 [phone]  . cloNIDine (CATAPRES - Dosed in mg/24 hr) patch 0.1 mg  0.1 mg Transdermal Q Mon-1800 Posey Pronto,  [place], MD   0.1 mg at 07/07/19 1707  . diphenoxylate-atropine (LOMOTIL) 2.5-0.025 MG per tablet 1 tablet  1 tablet Oral QID PRN Ivor Costa, MD      . DULoxetine (CYMBALTA) DR capsule 40 mg  40 mg Oral Daily Ivor Costa, MD   40 mg at 07/09/19 1150  . feeding supplement (BOOST / RESOURCE BREEZE) liquid 1 Container  1 Container Oral TID BM Fritzi Mandes, MD   1 Container at 07/09/19 1630  . gabapentin (NEURONTIN) capsule 300 mg  300 mg Oral Daily Charlett Nose, RPH   300 mg at 07/10/19 1018  . gabapentin (NEURONTIN) capsule 600 mg  600 mg Oral QHS Charlett Nose, RPH   600 mg at 07/09/19 2128  . ibuprofen  (ADVIL) tablet 200 mg  200 mg Oral Q6H PRN Ivor Costa, MD      . magic mouthwash  10 mL Oral QID Fritzi Mandes, MD   10 mL at 07/10/19 1329   And  . lidocaine (XYLOCAINE) 2 % viscous mouth solution 10 mL  10 mL Mouth/Throat QID Fritzi Mandes, MD   10 mL at 07/10/19 1329  . loperamide (IMODIUM) capsule 2 mg  2 mg Oral QID PRN Ivor Costa, MD      . menthol-cetylpyridinium (CEPACOL) lozenge 3 mg  1 lozenge Oral PRN Fritzi Mandes, MD      . meropenem (MERREM) 1 g in sodium chloride 0.9 % 100 mL IVPB  1 g Intravenous Q12H Dallie Piles, [facility]   Stopped at 07/12/19 [id number]  . morphine 2 MG/ML injection 1 mg  1 mg Intravenous Q8H PRN Max Sane, MD   1 mg at 07/12/19 1251  . multivitamin with minerals tablet 1 tablet  1 tablet Oral Daily Ivor Costa, MD   1 tablet at 07/10/19 1018  . ondansetron (ZOFRAN) injection 4 mg  4 mg Intravenous Q8H PRN Ivor Costa, MD   4 mg at 07/12/19 1251  . oxyCODONE (Oxy IR/ROXICODONE) immediate release tablet 5 mg  5 mg Oral Q6H PRN Ivor Costa, MD   5 mg at 07/11/19 2039  . pantoprazole (PROTONIX) EC tablet 40 mg  40 mg Oral Q1200 Ivor Costa, MD   40 mg at 07/11/19 1207  . polyethylene glycol (MIRALAX / GLYCOLAX) packet 17 g  17 g Oral Daily PRN Dallie Piles, RPH   17 g at 07/11/19 1205  . potassium chloride (KLOR-CON) CR tablet 20 mEq  20 mEq Oral Once Max Sane, MD      . senna-docusate (Senokot-S) tablet 2 tablet  2 tablet Oral STAT Manuella Ghazi, [place], MD      . sodium chloride flush (NS) 0.9 % injection 5 mL  5 mL Intracatheter Q8H Aletta Edouard, MD   5 mL at 07/12/19 1334  . sodium phosphate (FLEET) 7-19 GM/118ML enema 1 enema  1 enema Rectal STAT Max Sane, MD       Facility-Administered Medications Ordered in Other Encounters  Medication Dose Route Frequency Provider Last Rate Last Admin  . sodium chloride flush (NS) 0.9 % injection 10 mL  10 mL Intravenous PRN Cammie Sickle, MD   10 mL at 10/04/15 0901  . sodium chloride flush (NS) 0.9 % injection 10 mL  10 mL  Intravenous PRN Cammie Sickle, MD   10 mL at 07/02/17 1020     Discharge Medications: Please see discharge summary for a list of discharge medications.  Relevant Imaging Results:  Relevant Lab Results:   Additional Information  not a candidate for chemo; needs IV antibitoics; SS #[id number]  Truitt Merle, LCSW

## 2019-07-12 NOTE — Progress Notes (Signed)
[place] for Electrolyte Monitoring and Replacement   Recent Labs: Potassium (mmol/L)  Date Value  07/12/2019 3.4 (L)   Magnesium (mg/dL)  Date Value  07/12/2019 2.0   Calcium (mg/dL)  Date Value  07/12/2019 7.9 (L)   Albumin (g/dL)  Date Value  07/10/2019 2.0 (L)   Phosphorus (mg/dL)  Date Value  07/12/2019 2.5   Sodium (mmol/L)  Date Value  07/12/2019 141   Corrected Ca: 9.02 mg/dL  Assessment: 59 year old female admitted with weakness and abdominal pain. Patient with positive blood cultures and hepatic abscess s/p drain placement. Patient with electrolyte abnormalities on presentation. Patient at refeeding risk. Pharmacy consulted to manage electrolytes. Over the previous 24 hours the patient received approximately 60 mEq IV potassium and 20 mmol IV phosphorous. Fluids were changed on 2/16from D520 at 100 mL/hr to 0.45%NS w. 20 mEq KCl/L at 75 mL/hr  Goal of Therapy:  Electrolytes WNL  Plan: . K 3.4  Mag 2.0  Phos 2.5  Scr 0.70  Na 141   Will order KCL PO 20 meq x 1  Next electrolytes in am   Nancy Clark A,  07/12/2019 7:59 AM

## 2019-07-12 NOTE — Progress Notes (Signed)
Multiple attempts have been called to [facility] to give report to the nurse receiving this patient.  Charge nurse also called the facility with no results and was told that someone would call the nurse back for report. The receptionist answering the phone was notified that EMS is on the way and the patient will be in transport to the facility.

## 2019-07-12 NOTE — Discharge Summary (Addendum)
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 Chicken pox   . Colon cancer ([place])    Partial colectomy 07/2015 + chemo tx's  . Hypertension   . Hypokalemia   . Menopause    > 5 yrs  . 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  Sepsis due to Large liver abscessand Enterococcusbacteremia: -Present on admission -Polymicrobial bacteremia- ( enterococcus, klebsiella, enterobacter cloacae) due to liver abscess, Less likely PORT Pt has CBD stent placed in Dec 2020 Was on zosyn and it was changed to meropenem on 07/10/19 due to thrombocytopenia - continue with meropenem 1 gram IV q 12 at discharge until 08/02/19 -s/p IR guided drainage  Cancer of ascending colon metastatic to intra-abdominal lymph node, liver ([place]): s/p  ofchemotherapy. -Patient has very poor performance status.   Hypernatremia /Hypokalemia: Repleted resolved  Thrombocytopenia (HCC):Likely due to chemotherapy.No active bleeding   Normocytic anemia:Hemoglobin 9.6 s/p 1 PRBC transfusion on 2/18.   Essential hypertension: - cont clonidine and amlodipine  Severe protein calorie malnutrition  Overall poor prognosis.Palliative care to follow at the facility.  I strongly recommend considering transition to hospice if and when family is in agreement DISCHARGE CONDITIONS:  Fair CONSULTS OBTAINED:  Treatment Team:  Cammie Sickle, MD Tsosie Billing, MD DRUG ALLERGIES:  No Known Allergies DISCHARGE MEDICATIONS:   Allergies as of 07/12/2019   No Known Allergies     Medication List    STOP taking these medications   fentaNYL 50 MCG/HR Commonly known as: DURAGESIC   HYDROcodone-acetaminophen 5-325 MG tablet Commonly known as: NORCO/VICODIN   Stivarga 40 MG tablet Generic drug: regorafenib     TAKE these medications   acetaminophen 325 MG tablet Commonly known as: TYLENOL Take 2 tablets (650 mg total) by mouth every 6 (six) hours as needed for mild pain (or Fever >/= 101).   amLODipine 5 MG tablet Commonly known as: NORVASC Take 2 tablets (10 mg total) by mouth daily.   CALCIUM 600/VITAMIN D PO Take 1 tablet by mouth every evening.   cloNIDine 0.1 mg/24hr patch Commonly known as: CATAPRES - Dosed in mg/24 hr Place 1 patch (0.1 mg total) onto the skin every Monday at 6 PM.   diphenoxylate-atropine 2.5-0.025 MG tablet Commonly known as: LOMOTIL Take 1 tablet by mouth 4 (four) times daily as needed for diarrhea or loose stools. Take  it along with immodium   DULoxetine 20 MG capsule Commonly known as: CYMBALTA TAKE 2 CAPSULES DAILY   gabapentin 300 MG capsule Commonly known as: NEURONTIN Take 1-2 capsules (300-600 mg total) by mouth See admin instructions. Take 1 capsule (300 mg) by mouth daily  in the morning & take 2 capsules (600 mg) by mouth at night.   lidocaine-prilocaine cream Commonly known as: EMLA Apply 1 application topically as needed. Apply generously over the Mediport 45 minutes prior to chemotherapy. What changed:   reasons to take this  additional instructions   loperamide 2 MG capsule Commonly known as: IMODIUM Take 2 mg by mouth 4 (four) times daily as needed for diarrhea or loose stools.   magic mouthwash w/lidocaine Soln Take 5 mLs by mouth 4 (four) times daily.   meropenem  IVPB Commonly known as: MERREM Inject 1 g into the vein every 12 (twelve) hours for 21 days. Indication:  polymocrobial bacteremia and liver abscess Last Day of Therapy:  08/02/2019 Labs - Labs weekly while on IV antibiotics: _X_ CBC with differential  _X_ CMP Please leave PORTin place  Fax weekly labs toDr.[phone]   multivitamin with minerals Tabs tablet Take 1 tablet by mouth daily.   ondansetron 8 MG tablet Commonly known as: ZOFRAN One pill every 8 hours as needed for nausea/vomitting. What changed:   how much to take  how to take this  when to take this  reasons to take this  additional instructions   oxyCODONE 5 MG immediate release tablet Commonly known as: Oxy IR/ROXICODONE Take 1 tablet (5 mg total) by mouth every 6 (six) hours as needed for up to 1 day for moderate pain, severe pain or breakthrough pain.   potassium chloride SA 20 MEQ tablet Commonly known as: KLOR-CON Take 1 tablet (20 mEq total) by mouth 3 (three) times daily.   prochlorperazine 10 MG tablet Commonly known as: COMPAZINE Take 1 tablet (10 mg total) by mouth every 6 (six) hours as needed for nausea or vomiting.            Home Infusion Instuctions  (From admission, onward)         Start     Ordered   07/12/19 0000  Home infusion instructions    Question:  Instructions  Answer:  Flushing of vascular access device: 0.9% NaCl pre/post medication  administration and prn patency; Heparin 100 u/ml, 81ml for implanted ports and Heparin 10u/ml, 69ml for all other central venous catheters.   07/12/19 1052         DISCHARGE INSTRUCTIONS:   DIET:  Regular diet DISCHARGE CONDITION:  Fair ACTIVITY:  Activity as tolerated OXYGEN:  Home Oxygen: No.  Oxygen Delivery: room air DISCHARGE LOCATION:  nursing home -palliative care to follow.  Strongly recommend considering transition to hospice if family is in agreement  If you experience worsening of your admission symptoms, develop shortness of breath, life threatening emergency, suicidal or homicidal thoughts you must seek medical attention immediately by calling 911 or calling your MD immediately  if symptoms less severe.  You Must read complete instructions/literature along with all the possible adverse reactions/side effects for all the Medicines you take and that have been prescribed to you. Take any new Medicines after you have completely understood and accpet all the possible adverse reactions/side effects.   Please note  You were cared for by a hospitalist during your hospital stay. If you have any questions about your discharge medications or the care you received  while you were in the hospital after you are discharged, you can call the unit and asked to speak with the hospitalist on call if the hospitalist that took care of you is not available. Once you are discharged, your primary care physician will handle any further medical issues. Please note that NO REFILLS for any discharge medications will be authorized once you are discharged, as it is imperative that you return to your primary care physician (or establish a relationship with a primary care physician if you do not have one) for your aftercare needs so that they can reassess your need for medications and monitor your lab values.    On the day of Discharge:  VITAL SIGNS:  Blood pressure (!) 154/79, pulse 79, temperature 98.4  F (36.9 C), temperature source Oral, resp. rate 18, height 5\' 4"  (1.626 m), weight 35.5 kg, SpO2 100 %. PHYSICAL EXAMINATION:  GENERAL:  59 y.o.-year-old patient lying in the bed with no acute distress. Thin cachectic debilitated chronically  ill appearing EYES: Pupils equal, round, reactive to light and accommodation. No scleral icterus.  Pallor+ HEENT: Head atraumatic, normocephalic. Oropharynx and nasopharynx clear.  NECK:  Supple, no jugular venous distention. No thyroid enlargement, no tenderness.  LUNGS: Normal breath sounds bilaterally, no wheezing, rales, rhonchi. No use of accessory muscles of respiration. PORT + CARDIOVASCULAR: S1, S2 normal. No murmurs, rubs, or gallops.  ABDOMEN: Soft, +tender right Upper quadrant, nondistended. Bowel sounds present. No organomegaly or mass.  EXTREMITIES: No cyanosis, clubbing or edema b/l.    NEUROLOGIC: Cranial nerves II through XII are intact. No focal Motor or sensory deficits b/l.very weak and deconditioned   PSYCHIATRIC:  patient is alert and oriented SKIN: No obvious rash, lesion, or ulcer.  DATA REVIEW:   CBC Recent Labs  Lab 07/11/19 0442  WBC 14.5*  HGB 9.6*  HCT 29.2*  PLT 75*    Chemistries  Recent Labs  Lab 07/10/19 1543 07/11/19 0442 07/12/19 0650  NA  --    < > 141  K  --    < > 3.4*  CL  --    < > 106  CO2  --    < > 24  GLUCOSE  --    < > 90  BUN  --    < > 8  CREATININE  --    < > 0.70  CALCIUM  --    < > 7.9*  MG  --    < > 2.0  AST 12*  --   --   ALT 16  --   --   ALKPHOS 169*  --   --   BILITOT 1.7*  --   --    < > = values in this interval not displayed.       Contact information for follow-up providers    Aletta Edouard, MD Follow up.   Specialties: Interventional Radiology, Radiology Why: Schedulers to arrange follow-up for hepatic abscess drain.  Contact information: [place] [address] [phone]        Cammie Sickle, MD. Schedule an  appointment as soon as possible for a visit in 1 week(s).   Specialties: Internal Medicine, Oncology Contact information: [place] [address] [phone]            Contact information for after-discharge care    [facility] .   Service: Skilled Nursing Contact information: [address]  [place] [phone]                   Management plans discussed with the patient, family and they are in agreement.  CODE STATUS: DNR   TOTAL TIME TAKING CARE OF THIS PATIENT: 45 minutes.    Max Sane M.D on 07/12/2019 at 12:18 PM  [facility]   CC: Primary care physician; Patient, No Pcp Per   Note: This dictation was prepared with Dragon dictation along with smaller phrase technology. Any transcriptional errors that result from this process are unintentional.

## 2019-07-14 LAB — CULTURE, BLOOD (ROUTINE X 2)
Culture: NO GROWTH
Culture: NO GROWTH
Special Requests: ADEQUATE
Special Requests: ADEQUATE

## 2019-07-16 ENCOUNTER — Ambulatory Visit: Payer: Medicare Other | Admitting: Internal Medicine

## 2019-07-16 ENCOUNTER — Other Ambulatory Visit: Payer: Medicare Other

## 2019-07-16 ENCOUNTER — Ambulatory Visit: Payer: Medicare Other

## 2019-07-16 ENCOUNTER — Encounter: Payer: Medicare Other | Admitting: Hospice and Palliative Medicine

## 2019-07-17 MED ORDER — SODIUM CHLORIDE 0.9 % IV SOLN
INTRAVENOUS | Status: DC
Start: ? — End: 2019-07-17

## 2019-07-17 MED ORDER — GENERIC EXTERNAL MEDICATION
750.00 | Status: DC
Start: 2019-07-18 — End: 2019-07-17

## 2019-07-17 MED ORDER — GENERIC EXTERNAL MEDICATION
Status: DC
Start: ? — End: 2019-07-17

## 2019-07-17 MED ORDER — SODIUM CHLORIDE FLUSH 0.9 % IV SOLN
5.00 | INTRAVENOUS | Status: DC
Start: 2019-07-17 — End: 2019-07-17

## 2019-07-17 MED ORDER — HYDROMORPHONE HCL 1 MG/ML IJ SOLN
0.25 | INTRAMUSCULAR | Status: DC
Start: ? — End: 2019-07-17

## 2019-07-17 MED ORDER — FENTANYL CITRATE (PF) 50 MCG/ML IJ SOLN
25.00 | INTRAMUSCULAR | Status: DC
Start: ? — End: 2019-07-17

## 2019-07-17 MED ORDER — HEPARIN SODIUM (PORCINE) 5000 UNIT/ML IJ SOLN
5000.00 | INTRAMUSCULAR | Status: DC
Start: 2019-07-17 — End: 2019-07-17

## 2019-07-17 MED ORDER — DULOXETINE HCL 20 MG PO CPEP
40.00 | ORAL_CAPSULE | ORAL | Status: DC
Start: 2019-07-18 — End: 2019-07-17

## 2019-07-17 MED ORDER — GENERIC EXTERNAL MEDICATION
2000.00 | Status: DC
Start: 2019-07-17 — End: 2019-07-17

## 2019-07-17 MED ORDER — LIDOCAINE HCL 1 % IJ SOLN
0.50 | INTRAMUSCULAR | Status: DC
Start: ? — End: 2019-07-17

## 2019-07-17 MED ORDER — ASPIRIN 300 MG RE SUPP
150.00 | RECTAL | Status: DC
Start: 2019-07-18 — End: 2019-07-17

## 2019-07-18 ENCOUNTER — Telehealth: Payer: Self-pay | Admitting: Internal Medicine

## 2019-07-18 NOTE — Telephone Encounter (Signed)
On 2/25-spoke to Dr. Charm Barges at [facility].  Patient admitted hospital for mental status changes. Reviewed the oncology treatment so far. Discussed the multiple discussions we had the patient and family regarding palliative care/hospice-in the family's decision to decline hospice.

## 2019-08-21 DEATH — deceased

## 2020-07-31 IMAGING — CT CT ABDOMEN AND PELVIS WITH CONTRAST
1 of 3 series · 13 of 32 positions shown, 18 images · IV contrast (APPLIED)
Comparison: MRI of the abdomen 07/18/2018. CT the abdomen and
pelvis 02/22/2018.

CLINICAL DATA: 57-year-old female with history of metastatic
adenocarcinoma of the colon scheduled for upcoming liver ablation.

EXAM:
CT ABDOMEN AND PELVIS WITH CONTRAST
TECHNIQUE: Multidetector CT imaging of the abdomen and pelvis was performed
using the standard protocol following bolus administration of
intravenous contrast.
CONTRAST:  100mL OMNIPAQUE IOHEXOL 300 MG/ML  SOLN

[Series 2: axial st · axial · 0.56mm/px · z∈[-406,-36]mm · 13 of 84 slices shown, 18 images]
[im 5/84  soft-tissue]
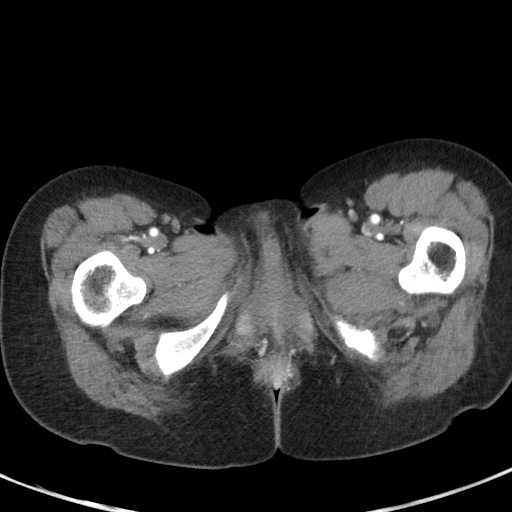
[im 5/84  bone]
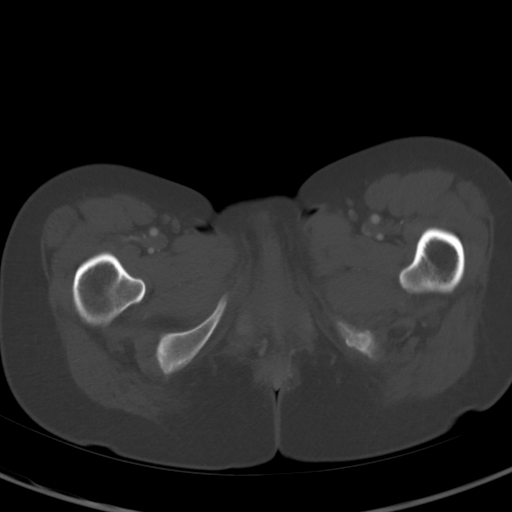
[im 13/84  soft-tissue]
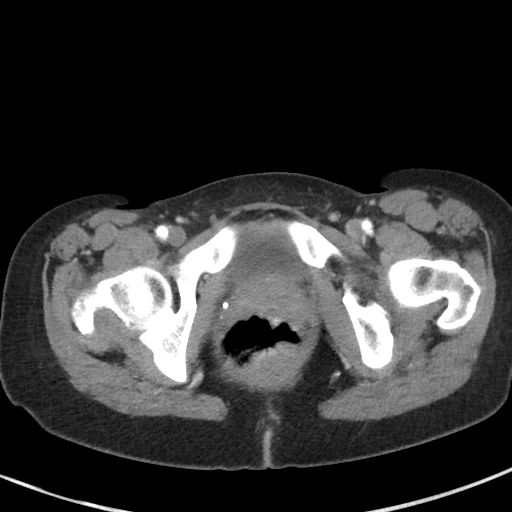
[im 17/84  soft-tissue]
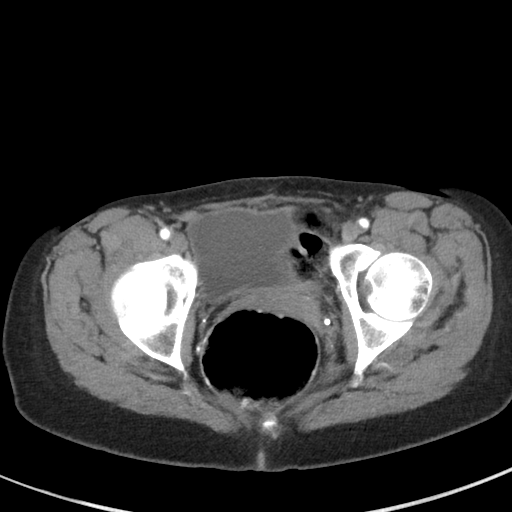
[im 25/84  soft-tissue]
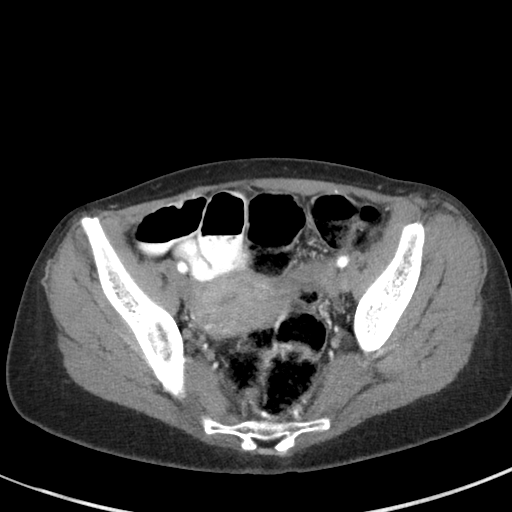
[im 34/84  soft-tissue]
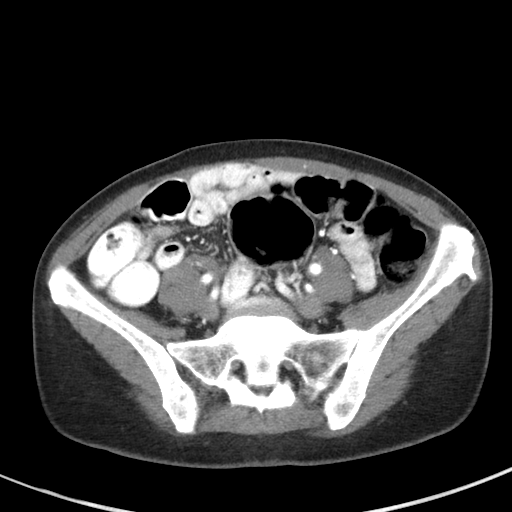
[im 38/84  soft-tissue]
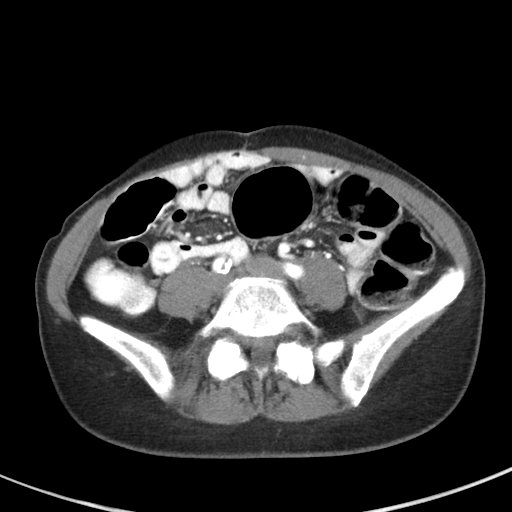
[im 46/84  soft-tissue]
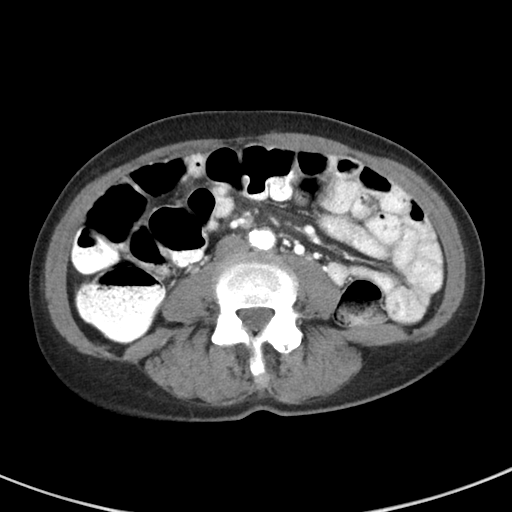
[im 50/84  soft-tissue]
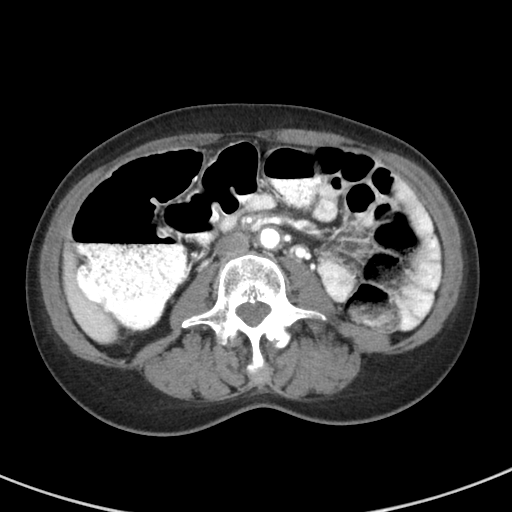
[im 59/84  soft-tissue]
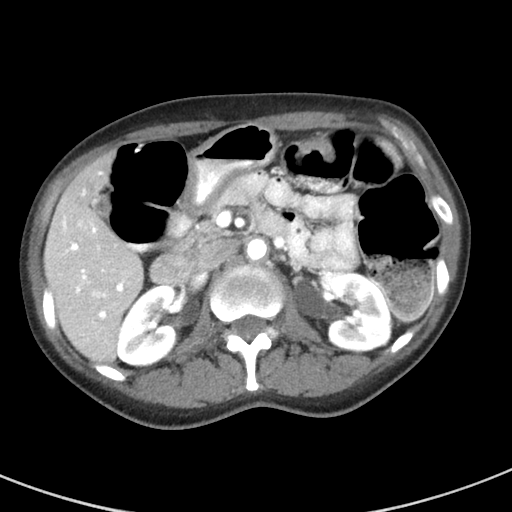
[im 59/84  bone]
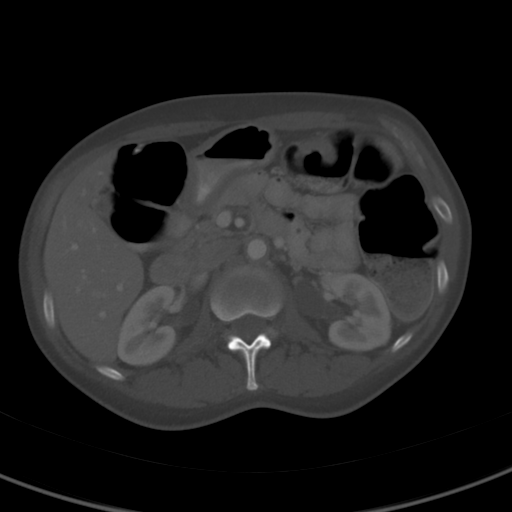
[im 67/84  soft-tissue]
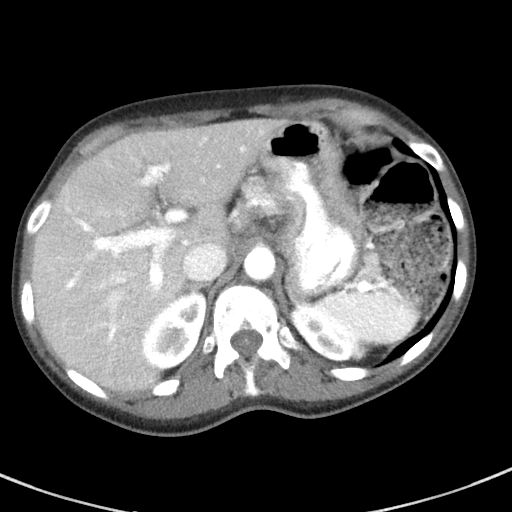
[im 67/84  lung]
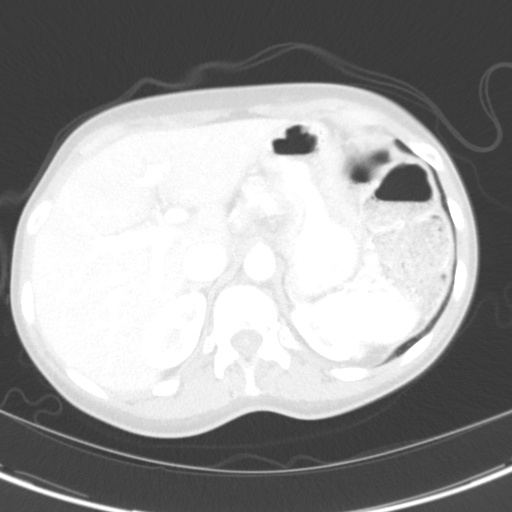
[im 71/84  soft-tissue]
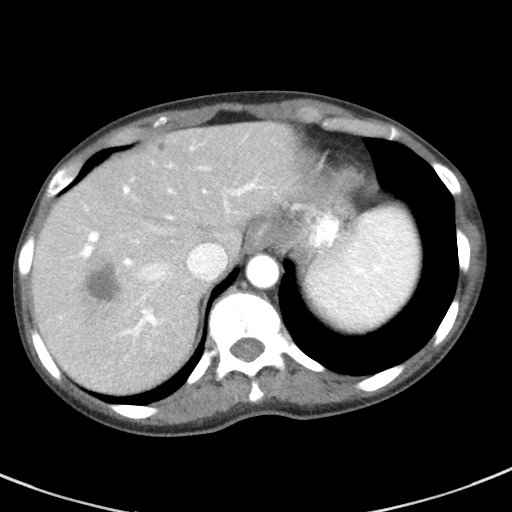
[im 71/84  lung]
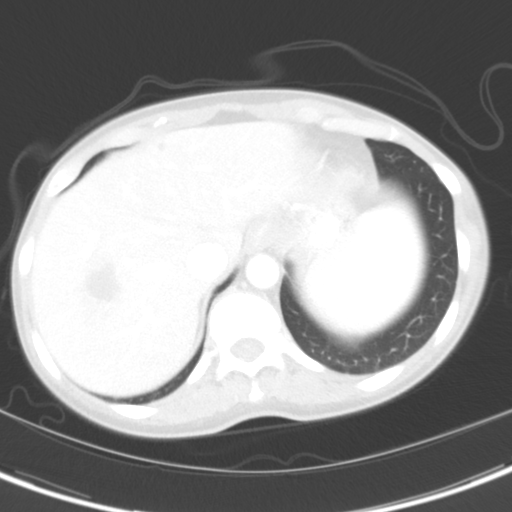
[im 75/84  lung]
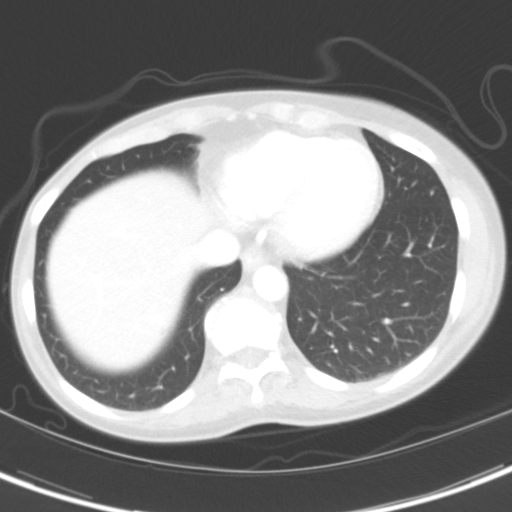
[im 79/84  soft-tissue]
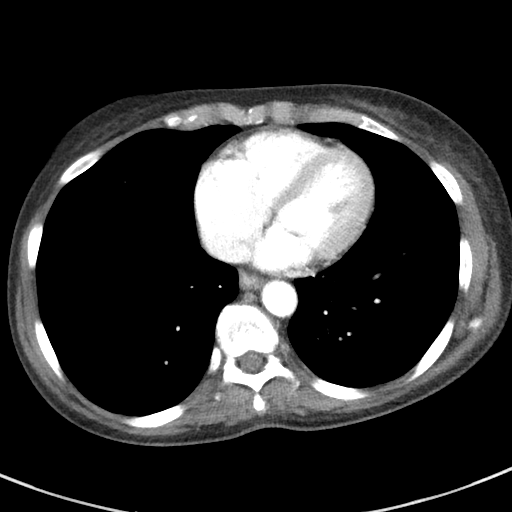
[im 79/84  lung]
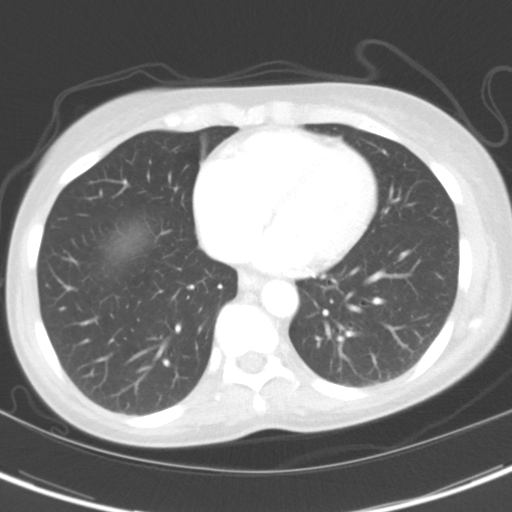

[13 of 32 positions shown; findings below may reference images not displayed]

FINDINGS: Lower chest: Unremarkable.

Hepatobiliary: The previously noted metastatic lesion in the right
lobe of the liver is located between segments 7 and 8 (axial image
15 of series 2 and coronal image 39 of series 5) in currently
measures 3.1 x 2.5 x 2.6 cm. Several other tiny subcentimeter
low-attenuation lesions are scattered throughout the liver, too
small to characterize, but similar in size and number to prior
examinations, statistically likely to represent tiny cysts. No new
suspicious appearing hepatic lesions. No intra or extrahepatic
biliary ductal dilatation. Gallbladder is normal in appearance.

Pancreas: No pancreatic mass. No pancreatic ductal dilatation. No
pancreatic or peripancreatic fluid or inflammatory changes.

Spleen: Unremarkable.

Adrenals/Urinary Tract: Bilateral kidneys and bilateral adrenal
glands are normal in appearance. No hydroureteronephrosis. Urinary
bladder is normal in appearance.

Stomach/Bowel: Normal appearance of the stomach. No pathologic
dilatation of small bowel or colon. The appendix is not confidently
identified and may be surgically absent. Regardless, there are no
inflammatory changes noted adjacent to the cecum to suggest the
presence of an acute appendicitis at this time.

Vascular/Lymphatic: Aortic atherosclerosis, without evidence of
aneurysm or dissection in the abdominal or pelvic vasculature. No
lymphadenopathy noted in the abdomen or pelvis.

Reproductive: Uterus and ovaries are unremarkable in appearance.

Other: No significant volume of ascites.  No pneumoperitoneum.

Musculoskeletal: Sclerotic lesion in L2 with post vertebroplasty
changes, similar to prior examinations. No new suspicious appearing
lytic or blastic lesions.
IMPRESSION: 1. Interval enlargement of metastatic lesion in segment [DATE] of the
liver, which currently measures 3.1 x 2.5 x 2.6 cm.
2. Similar sclerotic lesion in L2 vertebral body with post
vertebroplasty changes. No new osseous lesions are noted.
3. No other signs of metastatic disease elsewhere in the abdomen or
pelvis.
